# Patient Record
Sex: Female | Born: 1935 | Race: Black or African American | Hispanic: No | Smoking: Never smoker
Health system: Southern US, Community
[De-identification: ages and names within clinical notes are randomized; demographics above are authoritative.]

## PROBLEM LIST (undated history)

## (undated) DIAGNOSIS — C8300 Small cell B-cell lymphoma, unspecified site: Secondary | ICD-10-CM

## (undated) DIAGNOSIS — I5030 Unspecified diastolic (congestive) heart failure: Secondary | ICD-10-CM

## (undated) DIAGNOSIS — E785 Hyperlipidemia, unspecified: Secondary | ICD-10-CM

## (undated) DIAGNOSIS — N289 Disorder of kidney and ureter, unspecified: Secondary | ICD-10-CM

## (undated) DIAGNOSIS — I700 Atherosclerosis of aorta: Secondary | ICD-10-CM

## (undated) DIAGNOSIS — N183 Chronic kidney disease, stage 3 unspecified: Secondary | ICD-10-CM

## (undated) DIAGNOSIS — I10 Essential (primary) hypertension: Secondary | ICD-10-CM

## (undated) DIAGNOSIS — I509 Heart failure, unspecified: Secondary | ICD-10-CM

## (undated) DIAGNOSIS — I4821 Permanent atrial fibrillation: Secondary | ICD-10-CM

## (undated) DIAGNOSIS — D630 Anemia in neoplastic disease: Secondary | ICD-10-CM

## (undated) HISTORY — DX: Small cell B-cell lymphoma, unspecified site: C83.00

## (undated) HISTORY — DX: Essential (primary) hypertension: I10

## (undated) HISTORY — DX: Anemia in neoplastic disease: D63.0

## (undated) HISTORY — PX: ABDOMINAL HYSTERECTOMY: SHX81

## (undated) HISTORY — DX: Hyperlipidemia, unspecified: E78.5

## (undated) HISTORY — PX: HEMORRHOID SURGERY: SHX153

---
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
## 2005-01-21 ENCOUNTER — Ambulatory Visit: Payer: Self-pay | Admitting: Ophthalmology

## 2005-01-25 ENCOUNTER — Ambulatory Visit: Payer: Self-pay | Admitting: Ophthalmology

## 2006-06-24 ENCOUNTER — Inpatient Hospital Stay: Payer: Self-pay | Admitting: Urology

## 2006-07-03 ENCOUNTER — Other Ambulatory Visit: Payer: Self-pay

## 2006-07-03 ENCOUNTER — Ambulatory Visit: Payer: Self-pay | Admitting: Urology

## 2006-07-06 ENCOUNTER — Ambulatory Visit: Payer: Self-pay | Admitting: Urology

## 2006-07-17 ENCOUNTER — Other Ambulatory Visit: Payer: Self-pay

## 2006-07-17 ENCOUNTER — Inpatient Hospital Stay: Payer: Self-pay | Admitting: Cardiology

## 2006-07-18 ENCOUNTER — Other Ambulatory Visit: Payer: Self-pay

## 2006-07-19 ENCOUNTER — Other Ambulatory Visit: Payer: Self-pay

## 2006-07-25 ENCOUNTER — Ambulatory Visit: Payer: Self-pay | Admitting: Urology

## 2006-11-15 ENCOUNTER — Ambulatory Visit: Payer: Self-pay | Admitting: Urology

## 2007-02-15 ENCOUNTER — Emergency Department: Payer: Self-pay | Admitting: Emergency Medicine

## 2007-05-16 ENCOUNTER — Ambulatory Visit: Payer: Self-pay | Admitting: Family Medicine

## 2007-12-16 ENCOUNTER — Other Ambulatory Visit: Payer: Self-pay

## 2007-12-16 ENCOUNTER — Inpatient Hospital Stay: Payer: Self-pay | Admitting: Internal Medicine

## 2008-01-06 ENCOUNTER — Other Ambulatory Visit: Payer: Self-pay

## 2008-01-06 ENCOUNTER — Emergency Department: Payer: Self-pay | Admitting: Unknown Physician Specialty

## 2008-05-12 ENCOUNTER — Ambulatory Visit: Payer: Self-pay | Admitting: Urology

## 2008-12-18 ENCOUNTER — Ambulatory Visit: Payer: Self-pay | Admitting: Urology

## 2010-08-27 ENCOUNTER — Emergency Department: Payer: Self-pay | Admitting: Emergency Medicine

## 2011-06-07 ENCOUNTER — Ambulatory Visit: Payer: Self-pay | Admitting: Specialist

## 2011-06-15 ENCOUNTER — Ambulatory Visit: Payer: Self-pay | Admitting: Specialist

## 2012-06-20 ENCOUNTER — Emergency Department: Payer: Self-pay | Admitting: Emergency Medicine

## 2012-06-20 LAB — CBC WITH DIFFERENTIAL/PLATELET
Basophil #: 0 10*3/uL (ref 0.0–0.1)
Eosinophil #: 0 10*3/uL (ref 0.0–0.7)
HCT: 40.6 % (ref 35.0–47.0)
Lymphocyte %: 7.3 %
MCH: 25.8 pg — ABNORMAL LOW (ref 26.0–34.0)
MCV: 79 fL — ABNORMAL LOW (ref 80–100)
Monocyte %: 3.6 %
Neutrophil #: 5.6 10*3/uL (ref 1.4–6.5)
Neutrophil %: 88.4 %
Platelet: 131 10*3/uL — ABNORMAL LOW (ref 150–440)
RDW: 15.2 % — ABNORMAL HIGH (ref 11.5–14.5)
WBC: 6.3 10*3/uL (ref 3.6–11.0)

## 2012-06-20 LAB — URINALYSIS, COMPLETE
Bilirubin,UR: NEGATIVE
Ketone: NEGATIVE
Ph: 5 (ref 4.5–8.0)
Specific Gravity: 1.014 (ref 1.003–1.030)
Squamous Epithelial: 1

## 2012-06-20 LAB — COMPREHENSIVE METABOLIC PANEL
Albumin: 3.2 g/dL — ABNORMAL LOW (ref 3.4–5.0)
Alkaline Phosphatase: 73 U/L (ref 50–136)
Anion Gap: 12 (ref 7–16)
Calcium, Total: 9.2 mg/dL (ref 8.5–10.1)
Glucose: 142 mg/dL — ABNORMAL HIGH (ref 65–99)
SGOT(AST): 16 U/L (ref 15–37)

## 2012-06-20 LAB — LIPASE, BLOOD: Lipase: 102 U/L (ref 73–393)

## 2012-06-21 ENCOUNTER — Ambulatory Visit: Payer: Self-pay | Admitting: Internal Medicine

## 2012-06-21 LAB — PROTIME-INR: Prothrombin Time: 12.9 secs (ref 11.5–14.7)

## 2012-06-21 LAB — FERRITIN: Ferritin (ARMC): 76 ng/mL (ref 8–388)

## 2012-06-21 LAB — CREATININE, SERUM
Creatinine: 1.79 mg/dL — ABNORMAL HIGH (ref 0.60–1.30)
EGFR (African American): 30 — ABNORMAL LOW

## 2012-06-21 LAB — IRON AND TIBC: Iron Bind.Cap.(Total): 322 ug/dL (ref 250–450)

## 2012-06-21 LAB — APTT: Activated PTT: 30.2 secs (ref 23.6–35.9)

## 2012-06-22 LAB — CEA: CEA: 1.1 ng/mL (ref 0.0–4.7)

## 2012-06-24 ENCOUNTER — Emergency Department: Payer: Self-pay | Admitting: Internal Medicine

## 2012-06-24 LAB — COMPREHENSIVE METABOLIC PANEL
Albumin: 3.3 g/dL — ABNORMAL LOW (ref 3.4–5.0)
Alkaline Phosphatase: 71 U/L (ref 50–136)
Anion Gap: 8 (ref 7–16)
Bilirubin,Total: 0.3 mg/dL (ref 0.2–1.0)
Calcium, Total: 9.6 mg/dL (ref 8.5–10.1)
Creatinine: 1.49 mg/dL — ABNORMAL HIGH (ref 0.60–1.30)
EGFR (African American): 38 — ABNORMAL LOW
EGFR (Non-African Amer.): 33 — ABNORMAL LOW
Glucose: 111 mg/dL — ABNORMAL HIGH (ref 65–99)
Osmolality: 286 (ref 275–301)
Potassium: 3.8 mmol/L (ref 3.5–5.1)
Sodium: 141 mmol/L (ref 136–145)
Total Protein: 7.6 g/dL (ref 6.4–8.2)

## 2012-06-24 LAB — CBC
HCT: 41.5 % (ref 35.0–47.0)
HGB: 13.7 g/dL (ref 12.0–16.0)
MCH: 26.4 pg (ref 26.0–34.0)
MCHC: 33 g/dL (ref 32.0–36.0)
MCV: 80 fL (ref 80–100)
Platelet: 139 10*3/uL — ABNORMAL LOW (ref 150–440)
RBC: 5.18 10*6/uL (ref 3.80–5.20)
WBC: 4.6 10*3/uL (ref 3.6–11.0)

## 2012-06-27 LAB — CANCER CTR PLATELET CT: Platelet: 140 x10 3/mm — ABNORMAL LOW (ref 150–440)

## 2012-06-27 LAB — CREATININE, SERUM: EGFR (Non-African Amer.): 35 — ABNORMAL LOW

## 2012-06-28 ENCOUNTER — Ambulatory Visit: Payer: Self-pay | Admitting: Internal Medicine

## 2012-06-28 LAB — PROT IMMUNOELECTROPHORES(ARMC)

## 2012-07-02 DIAGNOSIS — N200 Calculus of kidney: Secondary | ICD-10-CM | POA: Insufficient documentation

## 2012-07-02 DIAGNOSIS — R8290 Unspecified abnormal findings in urine: Secondary | ICD-10-CM | POA: Insufficient documentation

## 2012-07-03 ENCOUNTER — Ambulatory Visit: Payer: Self-pay | Admitting: Internal Medicine

## 2012-07-03 DIAGNOSIS — D47Z9 Other specified neoplasms of uncertain behavior of lymphoid, hematopoietic and related tissue: Secondary | ICD-10-CM | POA: Insufficient documentation

## 2012-07-03 DIAGNOSIS — D499 Neoplasm of unspecified behavior of unspecified site: Secondary | ICD-10-CM | POA: Insufficient documentation

## 2012-07-03 DIAGNOSIS — D419 Neoplasm of uncertain behavior of unspecified urinary organ: Secondary | ICD-10-CM | POA: Insufficient documentation

## 2012-07-03 DIAGNOSIS — N1330 Unspecified hydronephrosis: Secondary | ICD-10-CM | POA: Insufficient documentation

## 2012-07-03 DIAGNOSIS — N201 Calculus of ureter: Secondary | ICD-10-CM | POA: Insufficient documentation

## 2012-07-03 DIAGNOSIS — R310 Gross hematuria: Secondary | ICD-10-CM | POA: Insufficient documentation

## 2012-09-21 ENCOUNTER — Ambulatory Visit: Payer: Self-pay | Admitting: Internal Medicine

## 2012-09-21 LAB — COMPREHENSIVE METABOLIC PANEL
Albumin: 3.9 g/dL (ref 3.4–5.0)
Alkaline Phosphatase: 89 U/L (ref 50–136)
Bilirubin,Total: 0.4 mg/dL (ref 0.2–1.0)
Calcium, Total: 9.9 mg/dL (ref 8.5–10.1)
Co2: 29 mmol/L (ref 21–32)
Creatinine: 1.22 mg/dL (ref 0.60–1.30)
EGFR (African American): 48 — ABNORMAL LOW
EGFR (Non-African Amer.): 42 — ABNORMAL LOW
Glucose: 106 mg/dL — ABNORMAL HIGH (ref 65–99)
SGOT(AST): 21 U/L (ref 15–37)
SGPT (ALT): 21 U/L (ref 12–78)

## 2012-09-21 LAB — CBC CANCER CENTER
Basophil %: 0.9 %
Eosinophil %: 1.4 %
HCT: 43.1 % (ref 35.0–47.0)
HGB: 14 g/dL (ref 12.0–16.0)
Lymphocyte #: 0.8 x10 3/mm — ABNORMAL LOW (ref 1.0–3.6)
MCH: 25.9 pg — ABNORMAL LOW (ref 26.0–34.0)
MCHC: 32.5 g/dL (ref 32.0–36.0)
Monocyte #: 0.6 x10 3/mm (ref 0.2–0.9)
Monocyte %: 11.3 %
Neutrophil #: 3.6 x10 3/mm (ref 1.4–6.5)
Neutrophil %: 71 %
Platelet: 154 x10 3/mm (ref 150–440)
RBC: 5.4 10*6/uL — ABNORMAL HIGH (ref 3.80–5.20)

## 2012-09-21 LAB — PROTIME-INR: INR: 0.9

## 2012-09-24 LAB — CEA: CEA: 1.3 ng/mL (ref 0.0–4.7)

## 2012-10-02 ENCOUNTER — Ambulatory Visit: Payer: Self-pay | Admitting: Internal Medicine

## 2012-10-03 ENCOUNTER — Ambulatory Visit: Payer: Self-pay | Admitting: Internal Medicine

## 2012-10-12 LAB — URINE IEP, RANDOM

## 2012-10-18 ENCOUNTER — Other Ambulatory Visit (HOSPITAL_COMMUNITY): Payer: Self-pay | Admitting: Internal Medicine

## 2012-10-18 ENCOUNTER — Ambulatory Visit: Payer: Self-pay | Admitting: Internal Medicine

## 2012-10-18 LAB — PROTIME-INR
INR: 1
Prothrombin Time: 13.1 secs (ref 11.5–14.7)

## 2012-11-03 ENCOUNTER — Ambulatory Visit: Payer: Self-pay | Admitting: Internal Medicine

## 2012-11-08 LAB — CBC CANCER CENTER
Basophil #: 0.1 x10 3/mm (ref 0.0–0.1)
Eosinophil #: 0.1 x10 3/mm (ref 0.0–0.7)
Eosinophil %: 2.3 %
HGB: 13.6 g/dL (ref 12.0–16.0)
Lymphocyte #: 1.1 x10 3/mm (ref 1.0–3.6)
MCV: 79 fL — ABNORMAL LOW (ref 80–100)
Monocyte #: 0.4 x10 3/mm (ref 0.2–0.9)
Monocyte %: 9.7 %
Neutrophil #: 2.6 x10 3/mm (ref 1.4–6.5)
Neutrophil %: 60.3 %
Platelet: 130 x10 3/mm — ABNORMAL LOW (ref 150–440)
RBC: 5.31 10*6/uL — ABNORMAL HIGH (ref 3.80–5.20)
WBC: 4.3 x10 3/mm (ref 3.6–11.0)

## 2012-11-08 LAB — CREATININE, SERUM
EGFR (African American): 39 — ABNORMAL LOW
EGFR (Non-African Amer.): 33 — ABNORMAL LOW

## 2012-11-22 LAB — CBC CANCER CENTER
Basophil #: 0 x10 3/mm (ref 0.0–0.1)
Eosinophil #: 0 x10 3/mm (ref 0.0–0.7)
Eosinophil %: 1.4 %
HCT: 39.3 % (ref 35.0–47.0)
MCH: 25.5 pg — ABNORMAL LOW (ref 26.0–34.0)
MCV: 78 fL — ABNORMAL LOW (ref 80–100)
RBC: 5.01 10*6/uL (ref 3.80–5.20)
RDW: 15.3 % — ABNORMAL HIGH (ref 11.5–14.5)
WBC: 3.5 x10 3/mm — ABNORMAL LOW (ref 3.6–11.0)

## 2012-11-22 LAB — CREATININE, SERUM
EGFR (African American): 39 — ABNORMAL LOW
EGFR (Non-African Amer.): 33 — ABNORMAL LOW

## 2012-11-29 LAB — CBC CANCER CENTER
Basophil #: 0 x10 3/mm (ref 0.0–0.1)
Basophil %: 0.9 %
HCT: 42.2 % (ref 35.0–47.0)
Lymphocyte #: 0.6 x10 3/mm — ABNORMAL LOW (ref 1.0–3.6)
Lymphocyte %: 14.5 %
MCH: 25.5 pg — ABNORMAL LOW (ref 26.0–34.0)
MCHC: 32.7 g/dL (ref 32.0–36.0)
MCV: 78 fL — ABNORMAL LOW (ref 80–100)
Monocyte #: 0.5 x10 3/mm (ref 0.2–0.9)
Monocyte %: 11.3 %
Neutrophil #: 3.1 x10 3/mm (ref 1.4–6.5)
Neutrophil %: 71.8 %
Platelet: 120 x10 3/mm — ABNORMAL LOW (ref 150–440)
RBC: 5.4 10*6/uL — ABNORMAL HIGH (ref 3.80–5.20)
RDW: 15.8 % — ABNORMAL HIGH (ref 11.5–14.5)
WBC: 4.4 x10 3/mm (ref 3.6–11.0)

## 2012-11-29 LAB — BASIC METABOLIC PANEL
Anion Gap: 8 (ref 7–16)
BUN: 21 mg/dL — ABNORMAL HIGH (ref 7–18)
Calcium, Total: 9 mg/dL (ref 8.5–10.1)
EGFR (African American): 43 — ABNORMAL LOW
Osmolality: 287 (ref 275–301)
Potassium: 3.7 mmol/L (ref 3.5–5.1)
Sodium: 142 mmol/L (ref 136–145)

## 2012-12-01 ENCOUNTER — Ambulatory Visit: Payer: Self-pay | Admitting: Internal Medicine

## 2012-12-06 LAB — CBC CANCER CENTER
Basophil #: 0.1 x10 3/mm (ref 0.0–0.1)
Basophil %: 1.1 %
Eosinophil #: 0.1 x10 3/mm (ref 0.0–0.7)
Eosinophil %: 1.6 %
HCT: 40.1 % (ref 35.0–47.0)
Lymphocyte %: 16.5 %
MCHC: 32.5 g/dL (ref 32.0–36.0)
MCV: 79 fL — ABNORMAL LOW (ref 80–100)
Monocyte #: 0.6 x10 3/mm (ref 0.2–0.9)
Neutrophil #: 3.9 x10 3/mm (ref 1.4–6.5)
Neutrophil %: 70.1 %
Platelet: 129 x10 3/mm — ABNORMAL LOW (ref 150–440)
RBC: 5.1 10*6/uL (ref 3.80–5.20)
RDW: 15.7 % — ABNORMAL HIGH (ref 11.5–14.5)
WBC: 5.5 x10 3/mm (ref 3.6–11.0)

## 2012-12-13 LAB — CBC CANCER CENTER
Basophil #: 0 x10 3/mm (ref 0.0–0.1)
Eosinophil #: 0.1 x10 3/mm (ref 0.0–0.7)
HCT: 39.6 % (ref 35.0–47.0)
HGB: 13 g/dL (ref 12.0–16.0)
Lymphocyte #: 0.8 x10 3/mm — ABNORMAL LOW (ref 1.0–3.6)
Monocyte #: 0.6 x10 3/mm (ref 0.2–0.9)
Monocyte %: 14.5 %
Neutrophil #: 2.8 x10 3/mm (ref 1.4–6.5)
Neutrophil %: 64 %
Platelet: 123 x10 3/mm — ABNORMAL LOW (ref 150–440)
RDW: 16.2 % — ABNORMAL HIGH (ref 11.5–14.5)
WBC: 4.3 x10 3/mm (ref 3.6–11.0)

## 2012-12-20 LAB — CBC CANCER CENTER
Eosinophil %: 2.1 %
HCT: 41.2 % (ref 35.0–47.0)
MCH: 25.7 pg — ABNORMAL LOW (ref 26.0–34.0)
MCHC: 32.6 g/dL (ref 32.0–36.0)
MCV: 79 fL — ABNORMAL LOW (ref 80–100)
Monocyte #: 0.5 x10 3/mm (ref 0.2–0.9)
Neutrophil #: 3 x10 3/mm (ref 1.4–6.5)
Platelet: 128 x10 3/mm — ABNORMAL LOW (ref 150–440)
RBC: 5.22 10*6/uL — ABNORMAL HIGH (ref 3.80–5.20)
RDW: 16.3 % — ABNORMAL HIGH (ref 11.5–14.5)
WBC: 4.3 x10 3/mm (ref 3.6–11.0)

## 2012-12-20 LAB — CREATININE, SERUM
Creatinine: 1.28 mg/dL (ref 0.60–1.30)
EGFR (African American): 45 — ABNORMAL LOW
EGFR (Non-African Amer.): 39 — ABNORMAL LOW

## 2012-12-21 LAB — PROT IMMUNOELECTROPHORES(ARMC)

## 2013-01-01 ENCOUNTER — Ambulatory Visit: Payer: Self-pay | Admitting: Internal Medicine

## 2013-01-10 LAB — CBC CANCER CENTER
Basophil #: 0 x10 3/mm (ref 0.0–0.1)
Basophil %: 0.2 %
Eosinophil #: 0.1 x10 3/mm (ref 0.0–0.7)
Eosinophil %: 1.8 %
Lymphocyte #: 0.8 x10 3/mm — ABNORMAL LOW (ref 1.0–3.6)
Lymphocyte %: 17.6 %
MCH: 25.5 pg — ABNORMAL LOW (ref 26.0–34.0)
MCHC: 31.7 g/dL — ABNORMAL LOW (ref 32.0–36.0)
MCV: 80 fL (ref 80–100)
Monocyte #: 0.6 x10 3/mm (ref 0.2–0.9)
Monocyte %: 12.6 %
Neutrophil #: 3.1 x10 3/mm (ref 1.4–6.5)
RBC: 5.33 10*6/uL — ABNORMAL HIGH (ref 3.80–5.20)
WBC: 4.5 x10 3/mm (ref 3.6–11.0)

## 2013-01-10 LAB — COMPREHENSIVE METABOLIC PANEL
Albumin: 3.4 g/dL (ref 3.4–5.0)
Alkaline Phosphatase: 71 U/L (ref 50–136)
Anion Gap: 7 (ref 7–16)
BUN: 26 mg/dL — ABNORMAL HIGH (ref 7–18)
Bilirubin,Total: 0.3 mg/dL (ref 0.2–1.0)
Calcium, Total: 9.2 mg/dL (ref 8.5–10.1)
Chloride: 102 mmol/L (ref 98–107)
Creatinine: 1.53 mg/dL — ABNORMAL HIGH (ref 0.60–1.30)
EGFR (African American): 37 — ABNORMAL LOW
EGFR (Non-African Amer.): 32 — ABNORMAL LOW
Glucose: 145 mg/dL — ABNORMAL HIGH (ref 65–99)
SGPT (ALT): 23 U/L (ref 12–78)
Sodium: 139 mmol/L (ref 136–145)
Total Protein: 7.8 g/dL (ref 6.4–8.2)

## 2013-01-11 LAB — URINE IEP, RANDOM

## 2013-01-24 ENCOUNTER — Ambulatory Visit: Payer: Self-pay | Admitting: Internal Medicine

## 2013-01-31 ENCOUNTER — Ambulatory Visit: Payer: Self-pay | Admitting: Internal Medicine

## 2013-02-14 ENCOUNTER — Ambulatory Visit: Payer: Self-pay | Admitting: Internal Medicine

## 2013-02-14 LAB — PROTIME-INR
INR: 1
Prothrombin Time: 13.6 secs (ref 11.5–14.7)

## 2013-02-14 LAB — APTT: Activated PTT: 30.9 secs (ref 23.6–35.9)

## 2013-02-14 LAB — PLATELET COUNT: Platelet: 154 10*3/uL (ref 150–440)

## 2013-03-03 ENCOUNTER — Ambulatory Visit: Payer: Self-pay | Admitting: Internal Medicine

## 2013-03-22 LAB — CBC CANCER CENTER
Eosinophil %: 1.4 %
HCT: 40.4 % (ref 35.0–47.0)
HGB: 13.6 g/dL (ref 12.0–16.0)
Lymphocyte %: 21.7 %
MCH: 26.7 pg (ref 26.0–34.0)
MCHC: 33.6 g/dL (ref 32.0–36.0)
MCV: 79 fL — ABNORMAL LOW (ref 80–100)
Monocyte #: 0.6 x10 3/mm (ref 0.2–0.9)
Monocyte %: 12.9 %
Platelet: 155 x10 3/mm (ref 150–440)
RDW: 14.7 % — ABNORMAL HIGH (ref 11.5–14.5)
WBC: 4.9 x10 3/mm (ref 3.6–11.0)

## 2013-03-22 LAB — COMPREHENSIVE METABOLIC PANEL
Anion Gap: 4 — ABNORMAL LOW (ref 7–16)
BUN: 26 mg/dL — ABNORMAL HIGH (ref 7–18)
Bilirubin,Total: 0.3 mg/dL (ref 0.2–1.0)
Calcium, Total: 9.4 mg/dL (ref 8.5–10.1)
Co2: 31 mmol/L (ref 21–32)
Creatinine: 1.41 mg/dL — ABNORMAL HIGH (ref 0.60–1.30)
EGFR (African American): 40 — ABNORMAL LOW
EGFR (Non-African Amer.): 35 — ABNORMAL LOW
Osmolality: 283 (ref 275–301)
Potassium: 3.7 mmol/L (ref 3.5–5.1)

## 2013-04-02 ENCOUNTER — Ambulatory Visit: Payer: Self-pay | Admitting: Internal Medicine

## 2013-04-11 DIAGNOSIS — C8590 Non-Hodgkin lymphoma, unspecified, unspecified site: Secondary | ICD-10-CM | POA: Insufficient documentation

## 2013-04-18 LAB — CBC CANCER CENTER
Basophil #: 0.1 x10 3/mm (ref 0.0–0.1)
Eosinophil #: 0.1 x10 3/mm (ref 0.0–0.7)
HCT: 43.8 % (ref 35.0–47.0)
HGB: 14.3 g/dL (ref 12.0–16.0)
MCHC: 32.7 g/dL (ref 32.0–36.0)
MCV: 79 fL — ABNORMAL LOW (ref 80–100)
Monocyte #: 0.4 x10 3/mm (ref 0.2–0.9)
Monocyte %: 10.8 %
Neutrophil #: 2.6 x10 3/mm (ref 1.4–6.5)
RBC: 5.58 10*6/uL — ABNORMAL HIGH (ref 3.80–5.20)
RDW: 14.6 % — ABNORMAL HIGH (ref 11.5–14.5)

## 2013-04-18 LAB — IRON AND TIBC
Iron Bind.Cap.(Total): 335 ug/dL (ref 250–450)
Unbound Iron-Bind.Cap.: 270 ug/dL

## 2013-04-18 LAB — HEPATIC FUNCTION PANEL A (ARMC)
Alkaline Phosphatase: 76 U/L (ref 50–136)
SGOT(AST): 12 U/L — ABNORMAL LOW (ref 15–37)
SGPT (ALT): 22 U/L (ref 12–78)
Total Protein: 8.4 g/dL — ABNORMAL HIGH (ref 6.4–8.2)

## 2013-04-18 LAB — CREATININE, SERUM: EGFR (Non-African Amer.): 32 — ABNORMAL LOW

## 2013-04-18 LAB — LACTATE DEHYDROGENASE: LDH: 167 U/L (ref 81–246)

## 2013-04-18 LAB — FERRITIN: Ferritin (ARMC): 65 ng/mL (ref 8–388)

## 2013-05-03 ENCOUNTER — Ambulatory Visit: Payer: Self-pay | Admitting: Internal Medicine

## 2013-06-03 ENCOUNTER — Ambulatory Visit: Payer: Self-pay | Admitting: Internal Medicine

## 2013-06-19 LAB — CBC CANCER CENTER
Basophil #: 0 x10 3/mm (ref 0.0–0.1)
Basophil %: 0.3 %
Eosinophil %: 1 %
Lymphocyte %: 22.2 %
MCH: 25.7 pg — ABNORMAL LOW (ref 26.0–34.0)
Monocyte %: 12.6 %
Neutrophil #: 3.2 x10 3/mm (ref 1.4–6.5)
Neutrophil %: 63.9 %
Platelet: 143 x10 3/mm — ABNORMAL LOW (ref 150–440)
RBC: 5.16 10*6/uL (ref 3.80–5.20)

## 2013-06-19 LAB — CREATININE, SERUM
Creatinine: 1.5 mg/dL — ABNORMAL HIGH (ref 0.60–1.30)
EGFR (African American): 37 — ABNORMAL LOW

## 2013-06-19 LAB — LACTATE DEHYDROGENASE: LDH: 154 U/L (ref 81–246)

## 2013-06-21 LAB — PROT IMMUNOELECTROPHORES(ARMC)

## 2013-06-27 ENCOUNTER — Ambulatory Visit: Payer: Self-pay | Admitting: Internal Medicine

## 2013-07-03 ENCOUNTER — Ambulatory Visit: Payer: Self-pay | Admitting: Internal Medicine

## 2013-08-21 ENCOUNTER — Ambulatory Visit: Payer: Self-pay | Admitting: Internal Medicine

## 2013-08-21 LAB — CBC CANCER CENTER
Basophil #: 0 x10 3/mm (ref 0.0–0.1)
Eosinophil %: 1.3 %
HCT: 42.5 % (ref 35.0–47.0)
Lymphocyte #: 0.8 x10 3/mm — ABNORMAL LOW (ref 1.0–3.6)
Lymphocyte %: 22.3 %
MCH: 25.6 pg — ABNORMAL LOW (ref 26.0–34.0)
MCHC: 31.9 g/dL — ABNORMAL LOW (ref 32.0–36.0)
Monocyte %: 12.1 %
Neutrophil %: 63.8 %
Platelet: 137 x10 3/mm — ABNORMAL LOW (ref 150–440)
RDW: 15.3 % — ABNORMAL HIGH (ref 11.5–14.5)

## 2013-08-21 LAB — LACTATE DEHYDROGENASE: LDH: 150 U/L (ref 81–246)

## 2013-08-21 LAB — CREATININE, SERUM
Creatinine: 1.44 mg/dL — ABNORMAL HIGH (ref 0.60–1.30)
EGFR (African American): 39 — ABNORMAL LOW
EGFR (Non-African Amer.): 34 — ABNORMAL LOW

## 2013-08-21 LAB — HEPATIC FUNCTION PANEL A (ARMC)
Albumin: 3.3 g/dL — ABNORMAL LOW (ref 3.4–5.0)
Bilirubin, Direct: 0.1 mg/dL (ref 0.00–0.20)
SGPT (ALT): 20 U/L (ref 12–78)

## 2013-09-02 ENCOUNTER — Ambulatory Visit: Payer: Self-pay | Admitting: Internal Medicine

## 2013-10-29 ENCOUNTER — Ambulatory Visit: Payer: Self-pay | Admitting: Internal Medicine

## 2013-11-13 ENCOUNTER — Ambulatory Visit: Payer: Self-pay | Admitting: Internal Medicine

## 2013-11-14 LAB — CBC CANCER CENTER
Basophil #: 0 x10 3/mm (ref 0.0–0.1)
Basophil %: 1.2 %
EOS ABS: 0.1 x10 3/mm (ref 0.0–0.7)
Eosinophil %: 2 %
HCT: 40.4 % (ref 35.0–47.0)
HGB: 12.8 g/dL (ref 12.0–16.0)
LYMPHS ABS: 0.8 x10 3/mm — AB (ref 1.0–3.6)
Lymphocyte %: 21.8 %
MCH: 25 pg — ABNORMAL LOW (ref 26.0–34.0)
MCHC: 31.6 g/dL — ABNORMAL LOW (ref 32.0–36.0)
MCV: 79 fL — ABNORMAL LOW (ref 80–100)
MONOS PCT: 13.4 %
Monocyte #: 0.5 x10 3/mm (ref 0.2–0.9)
Neutrophil #: 2.3 x10 3/mm (ref 1.4–6.5)
Neutrophil %: 61.6 %
Platelet: 144 x10 3/mm — ABNORMAL LOW (ref 150–440)
RBC: 5.11 10*6/uL (ref 3.80–5.20)
RDW: 15.5 % — ABNORMAL HIGH (ref 11.5–14.5)
WBC: 3.7 x10 3/mm (ref 3.6–11.0)

## 2013-11-14 LAB — CREATININE, SERUM
Creatinine: 1.61 mg/dL — ABNORMAL HIGH (ref 0.60–1.30)
EGFR (African American): 34 — ABNORMAL LOW
GFR CALC NON AF AMER: 29 — AB

## 2013-11-14 LAB — LACTATE DEHYDROGENASE: LDH: 168 U/L (ref 81–246)

## 2013-11-14 LAB — URIC ACID: Uric Acid: 7.3 mg/dL — ABNORMAL HIGH (ref 2.6–6.0)

## 2013-12-01 ENCOUNTER — Ambulatory Visit: Payer: Self-pay | Admitting: Internal Medicine

## 2013-12-19 ENCOUNTER — Telehealth: Payer: Self-pay | Admitting: Hematology and Oncology

## 2013-12-19 NOTE — Telephone Encounter (Signed)
S/W PATIENT AND GAVE NEW PATIENT APPT FOR 03/27 @ 8:30 W/DR. Lykens.  REFERRING DR. GITTIN DX- 2ND OPINION COLON CA WELCOME PACKET MAILED.

## 2013-12-24 ENCOUNTER — Telehealth: Payer: Self-pay | Admitting: Hematology and Oncology

## 2013-12-24 NOTE — Telephone Encounter (Signed)
C/D 12/24/13 for appt. 12/27/13

## 2013-12-27 ENCOUNTER — Encounter: Payer: Self-pay | Admitting: Hematology and Oncology

## 2013-12-27 ENCOUNTER — Ambulatory Visit: Payer: Commercial Managed Care - HMO

## 2013-12-27 ENCOUNTER — Ambulatory Visit (HOSPITAL_BASED_OUTPATIENT_CLINIC_OR_DEPARTMENT_OTHER): Payer: Commercial Managed Care - HMO | Admitting: Hematology and Oncology

## 2013-12-27 ENCOUNTER — Telehealth: Payer: Self-pay | Admitting: *Deleted

## 2013-12-27 VITALS — BP 179/64 | HR 81 | Temp 97.8°F | Resp 18 | Ht 61.0 in | Wt 207.3 lb

## 2013-12-27 DIAGNOSIS — R718 Other abnormality of red blood cells: Secondary | ICD-10-CM

## 2013-12-27 DIAGNOSIS — C880 Waldenstrom macroglobulinemia: Secondary | ICD-10-CM | POA: Insufficient documentation

## 2013-12-27 DIAGNOSIS — C8300 Small cell B-cell lymphoma, unspecified site: Secondary | ICD-10-CM

## 2013-12-27 DIAGNOSIS — R109 Unspecified abdominal pain: Secondary | ICD-10-CM

## 2013-12-27 DIAGNOSIS — C8589 Other specified types of non-Hodgkin lymphoma, extranodal and solid organ sites: Secondary | ICD-10-CM

## 2013-12-27 DIAGNOSIS — L989 Disorder of the skin and subcutaneous tissue, unspecified: Secondary | ICD-10-CM

## 2013-12-27 HISTORY — DX: Small cell B-cell lymphoma, unspecified site: C83.00

## 2013-12-27 NOTE — Telephone Encounter (Signed)
Dr. Heath Lark requests PET and CT scan done at [facility] on Disc to be sent to her at [facility]. [address],  [place] [address].   Dr. Alvy Bimler requests discs for our Radiologist review to present case at Tumor Board on 01/07/14.  S/w Rip Harbour in Radiology at [facility] and faxed over this note/request to fax [phone].

## 2013-12-27 NOTE — Progress Notes (Signed)
[facility] CONSULT NOTE  Patient Care Team: No Pcp Per Patient as PCP - General ([place]) Dallas Schimke, MD as Referring Physician (Internal Medicine)  CHIEF COMPLAINTS/PURPOSE OF CONSULTATION:  Second opinion for diagnosis of lymphoma  HISTORY OF PRESENTING ILLNESS:  Kristy Solomon 80 y.o. female is here because of her diagnosis of lymphoma. I collaborated the history with the patient. Outside information is inadequate. This patient was treated at [facility]. According to outside records, the patient initially presented to the emergency department in September of 2013 with flank pain, nausea, as well as abdominal pain. CT scan was abnormal with lymphadenopathy. The patient had history of kidney stones. Apparently, there was delay of getting a biopsy. 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 The patient was treated with single agent weekly rituximab in March of 2014. Repeat PET/CT scan showed no response to treatment. Imaging studies show multiple breast masses. On 02/14/2013, she underwent biopsy of the breast mass that came back lymphoma. The patient was being observed. Currently, she has regular, intermittent periumbilical pain several times a week. This is happening over the last 6 months. According to her, the pain is rated at 7/10 pain. She cannot describe the character of the pain. Activity such as moving around seems to improve  the pain but lying still may make it worse. It has no relationship with food intake. She has been taking Advil to control this pain. She denies any anorexia, weight loss, or change in bowel habits. She denies any new lymphadenopathy.  MEDICAL HISTORY:  Past Medical History  Diagnosis Date  . Hypertension   . Hyperlipidemia     SURGICAL HISTORY: Past Surgical History  Procedure Laterality Date  . Hemorrhoid surgery    . Abdominal  hysterectomy      SOCIAL HISTORY: History   Social History  . Marital Status: Widowed    Spouse Name: N/A    Number of Children: N/A  . Years of Education: N/A   Occupational History  . Not on file.   Social History Main Topics  . Smoking status: Never Smoker   . Smokeless tobacco: Never Used  . Alcohol Use: No  . Drug Use: No  . Sexual Activity: Not on file   Other Topics Concern  . Not on file   Social History Narrative  . No narrative on file    FAMILY HISTORY: Family History  Problem Relation Age of Onset  . Cancer Brother     throat ca  . Cancer Brother     bone cancer    ALLERGIES:  has No Known Allergies.  MEDICATIONS:  Current Outpatient Prescriptions  Medication Sig Dispense Refill  . aspirin 81 MG chewable tablet Chew 81 mg by mouth daily.      Marland Kitchen atorvastatin (LIPITOR) 20 MG tablet Take 20 mg by mouth daily.      . AZOR 5-40 MG per tablet Take 1 tablet by mouth daily.      Marland Kitchen lisinopril-hydrochlorothiazide (PRINZIDE,ZESTORETIC) 20-12.5 MG per tablet Take 1 tablet by mouth daily.      . Multiple Vitamin (MULTIVITAMIN) tablet Take 1 tablet by mouth daily.      . polyethylene glycol (MIRALAX / GLYCOLAX) packet Take 17 g by mouth daily as needed.       No current facility-administered medications for this visit.    REVIEW OF SYSTEMS:   Constitutional: Denies fevers, chills or abnormal night sweats Eyes: Denies  blurriness of vision, double vision or watery eyes Ears, nose, mouth, throat, and face: Denies mucositis or sore throat Respiratory: Denies cough, dyspnea or wheezes Cardiovascular: Denies palpitation, chest discomfort or lower extremity swelling Skin: Denies abnormal skin rashes Lymphatics: Denies new lymphadenopathy or easy bruising Neurological:Denies numbness, tingling or new weaknesses Behavioral/Psych: Mood is stable, no new changes  All other systems were reviewed with the patient and are negative.  PHYSICAL EXAMINATION: ECOG  PERFORMANCE STATUS: 0 - Asymptomatic  Filed Vitals:   12/27/13 0908  BP: 179/64  Pulse: 81  Temp: 97.8 F (36.6 C)  Resp: 18   Filed Weights   12/27/13 0908  Weight: 207 lb 4.8 oz (94.031 kg)    GENERAL:alert, no distress and comfortable. The patient is morbidly obese SKIN: Noted moist skin with a bit of pressure ulcer underneath the skin fold around her suprapubic region. No other abnormal skin rashes elsewhere EYES: normal, conjunctiva are pink and non-injected, sclera clear OROPHARYNX:no exudate, no erythema and lips, buccal mucosa, and tongue normal  NECK: supple, thyroid normal size, non-tender, without nodularity LYMPH:  no palpable lymphadenopathy in the cervical, axillary or inguinal LUNGS: clear to auscultation and percussion with normal breathing effort HEART: regular rate & rhythm and no murmurs and no lower extremity edema ABDOMEN:abdomen soft, non-tender and normal bowel sounds. Well-healed surgical scar. Musculoskeletal:no cyanosis of digits and no clubbing  PSYCH: alert & oriented x 3 with fluent speech NEURO: no focal motor/sensory deficits  LABORATORY DATA: Her most recent CBC show mild microcytosis I have reviewed the data as listed  ASSESSMENT & PLAN:  #1 lymphoplasmacytic lymphoma, status post treatment #2 intermittent chronic periumbilical pain I have requested the patient to sign consent to release information so that I could review her outside material. I will request prior biopsy slides to be revealed by her pathologist. I would also request PET/CT imaging study to be review by our radiologists. I would get her case presented at the next hematology tumor board on 01/07/2014. Due to difficulties with transportation, I will call the patient with the outcome of their review. The patient understood that she may need to come back in the future for further evaluation and possible treatment. #3 skin lesion I recommend the patient to continue local care and  avoid excessive moisture at that region to prevent infection.  All questions were answered. The patient knows to call the clinic with any problems, questions or concerns. I spent 55 minutes counseling the patient face to face. The total time spent in the appointment was 60 minutes and more than 50% was on counseling.     [place], [place], MD 12/27/2013 9:49 AM

## 2013-12-27 NOTE — Progress Notes (Signed)
Checked in new pt with no financial concerns. °

## 2013-12-30 NOTE — Telephone Encounter (Signed)
Called Pathology Dept at [facility] and left VM.  Requesting they either fax [place] the request form to have path slides sent to [place] or let us know which fax number I should fax release of information to request slides. I did speak w/ someone in Path Dept on Friday 3/27.  They were going to fax [place] over a "request form" to get the slides.  Have not received a request form.  Waiting for return call.

## 2013-12-30 NOTE — Telephone Encounter (Signed)
Received call from Jean [place] at [facility].  Ph [phone].  She instructs to fax over release of info/request for slides to fax [phone].  Faxed over release and request.

## 2014-01-01 ENCOUNTER — Telehealth: Payer: Self-pay | Admitting: *Deleted

## 2014-01-01 NOTE — Telephone Encounter (Signed)
S/w Chanel at [facility] Radiology [phone] and she reports the discs were sent out on Monday 3/30.  Called [facility] Radiology dept and they will leave message for Tedra Coupe to return call in morning to let us know if they received the discs yet.

## 2014-01-01 NOTE — Telephone Encounter (Signed)
[place] Pathology Dept at ph [phone] to f/u on request for slides.  S/w Estill Bamberg who states the slides were sent out today and our Path dept should receive them tomorrow.  [facility] Radiology Dept and s/w to f/u on request for scans on disc to be sent to [place].

## 2014-01-01 NOTE — Telephone Encounter (Signed)
Message copied by Cathlean Cower on Wed Jan 01, 2014  4:14 PM ------      Message from: [facility], [place]      Created: Wed Jan 01, 2014  1:53 PM      Regarding: outside stuff       Still no signs of outside records! ------

## 2014-01-02 ENCOUNTER — Other Ambulatory Visit (HOSPITAL_COMMUNITY): Payer: Self-pay | Admitting: Internal Medicine

## 2014-01-02 ENCOUNTER — Encounter: Payer: Self-pay | Admitting: *Deleted

## 2014-01-02 ENCOUNTER — Telehealth: Payer: Self-pay | Admitting: *Deleted

## 2014-01-02 NOTE — Telephone Encounter (Signed)
S/w Tedra Coupe at [facility].  She has not received PET/CT from [place] yet. She did call them and they confirm the discs were sent on Monday by standard mail.  We should be receiving them soon.  She will let us know when they arrive.

## 2014-01-02 NOTE — Telephone Encounter (Signed)
Spoke w/ Vada at [facility] Pathology and they have received pt's slides from [place].  Dr. Tammi Klippel is reviewing them now.

## 2014-01-02 NOTE — Progress Notes (Signed)
PET/CT from [facility] came in mail this afternoon to Dr. Alvy Bimler.  Took discs to [facility] Radiology dept for them to enter into our system and for our Radiologists to review.

## 2014-01-03 ENCOUNTER — Other Ambulatory Visit: Payer: Self-pay | Admitting: Hematology and Oncology

## 2014-01-03 ENCOUNTER — Inpatient Hospital Stay
Admission: RE | Admit: 2014-01-03 | Discharge: 2014-01-03 | Disposition: A | Discharge status: Home / Self Care | Payer: Self-pay | Source: Ambulatory Visit | Attending: Hematology and Oncology | Admitting: Hematology and Oncology

## 2014-01-03 DIAGNOSIS — C8300 Small cell B-cell lymphoma, unspecified site: Secondary | ICD-10-CM

## 2014-01-10 ENCOUNTER — Telehealth: Payer: Self-pay | Admitting: Hematology and Oncology

## 2014-01-10 NOTE — Telephone Encounter (Signed)
I spoke with the patient over the telephone, informing her about our discussion from recent hematology tumor board. Overall, she has low-grade non-Hodgkin lymphoma. The radiologist did not think there is substantial disease in the small bowel mesentery that could cause periumbilical pain. Right now, I do not feel strongly she needs to be started on treatment. I recommend return appointment in 4 months with history, physical examination and blood work. The patient will arrange with family members to get transportation here and she will call me about the date and time that what best for her. I addressed all questions and concerns.

## 2014-04-12 ENCOUNTER — Emergency Department: Payer: Self-pay | Admitting: Emergency Medicine

## 2014-04-12 LAB — CBC WITH DIFFERENTIAL/PLATELET
BASOS ABS: 0 10*3/uL (ref 0.0–0.1)
Basophil %: 0.9 %
EOS PCT: 0.3 %
Eosinophil #: 0 10*3/uL (ref 0.0–0.7)
HCT: 41.6 % (ref 35.0–47.0)
HGB: 13.5 g/dL (ref 12.0–16.0)
LYMPHS PCT: 11.1 %
Lymphocyte #: 0.5 10*3/uL — ABNORMAL LOW (ref 1.0–3.6)
MCH: 25.5 pg — ABNORMAL LOW (ref 26.0–34.0)
MCHC: 32.3 g/dL (ref 32.0–36.0)
MCV: 79 fL — ABNORMAL LOW (ref 80–100)
MONO ABS: 0.5 x10 3/mm (ref 0.2–0.9)
Monocyte %: 10.3 %
NEUTROS ABS: 3.5 10*3/uL (ref 1.4–6.5)
Neutrophil %: 77.4 %
Platelet: 132 10*3/uL — ABNORMAL LOW (ref 150–440)
RBC: 5.27 10*6/uL — AB (ref 3.80–5.20)
RDW: 15.8 % — ABNORMAL HIGH (ref 11.5–14.5)
WBC: 4.5 10*3/uL (ref 3.6–11.0)

## 2014-04-12 LAB — COMPREHENSIVE METABOLIC PANEL
ALT: 13 U/L (ref 12–78)
ANION GAP: 10 (ref 7–16)
Albumin: 2.7 g/dL — ABNORMAL LOW (ref 3.4–5.0)
Alkaline Phosphatase: 52 U/L
BILIRUBIN TOTAL: 0.4 mg/dL (ref 0.2–1.0)
BUN: 28 mg/dL — ABNORMAL HIGH (ref 7–18)
Calcium, Total: 9.1 mg/dL (ref 8.5–10.1)
Chloride: 104 mmol/L (ref 98–107)
Co2: 24 mmol/L (ref 21–32)
Creatinine: 1.73 mg/dL — ABNORMAL HIGH (ref 0.60–1.30)
EGFR (Non-African Amer.): 27 — ABNORMAL LOW
GFR CALC AF AMER: 31 — AB
GLUCOSE: 133 mg/dL — AB (ref 65–99)
OSMOLALITY: 283 (ref 275–301)
Potassium: 3.4 mmol/L — ABNORMAL LOW (ref 3.5–5.1)
SGOT(AST): 15 U/L (ref 15–37)
Sodium: 138 mmol/L (ref 136–145)
Total Protein: 8.7 g/dL — ABNORMAL HIGH (ref 6.4–8.2)

## 2014-04-12 LAB — URINALYSIS, COMPLETE
BILIRUBIN, UR: NEGATIVE
GLUCOSE, UR: NEGATIVE mg/dL (ref 0–75)
Ketone: NEGATIVE
Nitrite: NEGATIVE
Ph: 5 (ref 4.5–8.0)
Protein: 30
Specific Gravity: 1.019 (ref 1.003–1.030)
Squamous Epithelial: 2
WBC UR: 42 /HPF (ref 0–5)

## 2014-04-12 LAB — LIPASE, BLOOD: Lipase: 107 U/L (ref 73–393)

## 2014-04-12 LAB — TROPONIN I: Troponin-I: <0.02 ng/mL

## 2014-04-13 LAB — URINE CULTURE

## 2014-06-11 ENCOUNTER — Ambulatory Visit: Payer: Self-pay | Admitting: General Surgery

## 2014-06-24 ENCOUNTER — Telehealth: Payer: Self-pay

## 2014-06-24 NOTE — Telephone Encounter (Signed)
Received fax from [facility] that patient is ready to be seen.  Spoke with patient who stated she is ready to be seen in the next few weeks but needs to work on transportation with one of her grandsons. Patient will call Dr Calton Dach nurse back with dates she has transportation.  Office number given to patient and explained to ask for Dr Calton Dach nurse.  Patient voiced understanding and will call back with any other questions or concerns.

## 2014-06-26 ENCOUNTER — Telehealth: Payer: Self-pay | Admitting: *Deleted

## 2014-06-26 ENCOUNTER — Telehealth: Payer: Self-pay | Admitting: Hematology and Oncology

## 2014-06-26 NOTE — Telephone Encounter (Signed)
Pt requests to see Dr. Alvy Bimler tomorrow.  Informed her Dr. Alvy Bimler not in office tomorrow.  Pt says she can come next Friday 10/02.  Instructed pt to come at 10:45 am for Lab and will see Dr. Alvy Bimler at 11:15 am.  Pt says her grandson can bring her on this day as long as he can get back to [facility] in time to work at 3 pm.  Assured pt they will get back to [facility] by 3 pm.   Sent Request to Scheduler to add pt on for 10/02.

## 2014-06-26 NOTE — Telephone Encounter (Signed)
s.w. pt and advised on OCT appt.Marland KitchenMarland KitchenMarland KitchenMarland Kitchenpt was already aware

## 2014-06-27 ENCOUNTER — Other Ambulatory Visit: Payer: Self-pay | Admitting: Hematology and Oncology

## 2014-06-27 ENCOUNTER — Encounter: Payer: Self-pay | Admitting: Hematology and Oncology

## 2014-06-27 DIAGNOSIS — D630 Anemia in neoplastic disease: Secondary | ICD-10-CM | POA: Insufficient documentation

## 2014-06-27 DIAGNOSIS — C8300 Small cell B-cell lymphoma, unspecified site: Secondary | ICD-10-CM

## 2014-06-27 HISTORY — DX: Anemia in neoplastic disease: D63.0

## 2014-06-27 NOTE — Telephone Encounter (Signed)
Done, thanks

## 2014-07-04 ENCOUNTER — Telehealth: Payer: Self-pay | Admitting: *Deleted

## 2014-07-04 ENCOUNTER — Telehealth: Payer: Self-pay | Admitting: Hematology and Oncology

## 2014-07-04 ENCOUNTER — Ambulatory Visit (HOSPITAL_BASED_OUTPATIENT_CLINIC_OR_DEPARTMENT_OTHER): Payer: Commercial Managed Care - HMO | Admitting: Hematology and Oncology

## 2014-07-04 ENCOUNTER — Encounter: Payer: Self-pay | Admitting: Hematology and Oncology

## 2014-07-04 ENCOUNTER — Other Ambulatory Visit (HOSPITAL_BASED_OUTPATIENT_CLINIC_OR_DEPARTMENT_OTHER): Payer: Commercial Managed Care - HMO

## 2014-07-04 VITALS — BP 164/63 | HR 65 | Temp 98.4°F | Resp 20 | Ht 61.0 in | Wt 204.7 lb

## 2014-07-04 DIAGNOSIS — E790 Hyperuricemia without signs of inflammatory arthritis and tophaceous disease: Secondary | ICD-10-CM | POA: Insufficient documentation

## 2014-07-04 DIAGNOSIS — C8300 Small cell B-cell lymphoma, unspecified site: Secondary | ICD-10-CM

## 2014-07-04 DIAGNOSIS — N178 Other acute kidney failure: Secondary | ICD-10-CM

## 2014-07-04 DIAGNOSIS — N189 Chronic kidney disease, unspecified: Secondary | ICD-10-CM | POA: Insufficient documentation

## 2014-07-04 DIAGNOSIS — D630 Anemia in neoplastic disease: Secondary | ICD-10-CM

## 2014-07-04 DIAGNOSIS — N179 Acute kidney failure, unspecified: Secondary | ICD-10-CM | POA: Insufficient documentation

## 2014-07-04 DIAGNOSIS — D696 Thrombocytopenia, unspecified: Secondary | ICD-10-CM

## 2014-07-04 DIAGNOSIS — Z23 Encounter for immunization: Secondary | ICD-10-CM

## 2014-07-04 LAB — CBC WITH DIFFERENTIAL/PLATELET
BASO%: 1 % (ref 0.0–2.0)
Basophils Absolute: 0 10e3/uL (ref 0.0–0.1)
EOS%: 1.2 % (ref 0.0–7.0)
Eosinophils Absolute: 0.1 10e3/uL (ref 0.0–0.5)
HCT: 41.8 % (ref 34.8–46.6)
HGB: 13.3 g/dL (ref 11.6–15.9)
LYMPH%: 18.6 % (ref 14.0–49.7)
MCH: 24.8 pg — ABNORMAL LOW (ref 25.1–34.0)
MCHC: 31.8 g/dL (ref 31.5–36.0)
MCV: 78.2 fL — ABNORMAL LOW (ref 79.5–101.0)
MONO#: 0.4 10e3/uL (ref 0.1–0.9)
MONO%: 9.8 % (ref 0.0–14.0)
NEUT#: 2.8 10e3/uL (ref 1.5–6.5)
NEUT%: 69.4 % (ref 38.4–76.8)
Platelets: 128 10e3/uL — ABNORMAL LOW (ref 145–400)
RBC: 5.34 10e6/uL (ref 3.70–5.45)
RDW: 15.6 % — ABNORMAL HIGH (ref 11.2–14.5)
WBC: 4.1 10e3/uL (ref 3.9–10.3)
lymph#: 0.8 10e3/uL — ABNORMAL LOW (ref 0.9–3.3)

## 2014-07-04 LAB — IRON AND TIBC CHCC
%SAT: 18 % — ABNORMAL LOW (ref 21–57)
Iron: 49 ug/dL (ref 41–142)
TIBC: 265 ug/dL (ref 236–444)
UIBC: 217 ug/dL (ref 120–384)

## 2014-07-04 LAB — COMPREHENSIVE METABOLIC PANEL (CC13)
ALT: 10 U/L (ref 0–55)
AST: 10 U/L (ref 5–34)
Albumin: 3 g/dL — ABNORMAL LOW (ref 3.5–5.0)
Alkaline Phosphatase: 55 U/L (ref 40–150)
Anion Gap: 10 meq/L (ref 3–11)
BUN: 30.9 mg/dL — ABNORMAL HIGH (ref 7.0–26.0)
CO2: 25 meq/L (ref 22–29)
Calcium: 10.2 mg/dL (ref 8.4–10.4)
Chloride: 106 meq/L (ref 98–109)
Creatinine: 1.8 mg/dL — ABNORMAL HIGH (ref 0.6–1.1)
Glucose: 97 mg/dL (ref 70–140)
Potassium: 3.8 meq/L (ref 3.5–5.1)
Sodium: 141 meq/L (ref 136–145)
Total Bilirubin: 0.36 mg/dL (ref 0.20–1.20)
Total Protein: 9.7 g/dL — ABNORMAL HIGH (ref 6.4–8.3)

## 2014-07-04 LAB — LACTATE DEHYDROGENASE (CC13): LDH: 161 U/L (ref 125–245)

## 2014-07-04 LAB — FERRITIN CHCC: Ferritin: 27 ng/ml (ref 9–269)

## 2014-07-04 LAB — HOLD TUBE, BLOOD BANK

## 2014-07-04 LAB — URIC ACID (CC13): Uric Acid, Serum: 10.9 mg/dL — ABNORMAL HIGH (ref 2.6–7.4)

## 2014-07-04 MED ORDER — INFLUENZA VAC SPLIT QUAD 0.5 ML IM SUSY
0.5000 mL | PREFILLED_SYRINGE | Freq: Once | INTRAMUSCULAR | Status: AC
  Administered 2014-07-04: 0.5 mL via INTRAMUSCULAR
  Filled 2014-07-04: qty 0.5

## 2014-07-04 NOTE — Telephone Encounter (Signed)
Message copied by Cathlean Cower on Fri Jul 04, 2014  1:00 PM ------      Message from: [facility], [place]: Fri Jul 04, 2014 12:58 PM      Regarding: elevated creatinine       Please see if you can call her PCP for recheck. Let the patient know she may need to cut back on her BP medication.      ----- Message -----         From: Lab in [facility]         Sent: 07/04/2014  11:06 AM           To: Heath Lark, MD                   ------

## 2014-07-04 NOTE — Telephone Encounter (Signed)
Called pt for name of her PCP.  She said she would have to find it and call nurse back.

## 2014-07-04 NOTE — Telephone Encounter (Signed)
gv and printed appt sched and avs for pt for April 2016 °

## 2014-07-04 NOTE — Progress Notes (Signed)
[facility] OFFICE PROGRESS NOTE  Patient Care Team: No Pcp Per Patient as PCP - General ([place]) Dallas Schimke, MD as Referring Physician (Internal Medicine)  SUMMARY OF ONCOLOGIC HISTORY: This patient was treated at [facility]. According to outside records, the patient initially presented to the emergency department in September of 2013 with flank pain, nausea, as well as abdominal pain. CT scan was abnormal with lymphadenopathy. The patient had history of kidney stones. Apparently, there was delay of getting a biopsy. 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 The patient was treated with single agent weekly rituximab in March of 2014. Repeat PET/CT scan showed no response to treatment. Imaging studies show multiple breast masses. On 02/14/2013, she underwent biopsy of the breast mass that came back lymphoma. The patient was being observed.  INTERVAL HISTORY: Please see below for problem oriented charting. Since I last saw her, her periumbilical pain has resolved. She denies recent infection. Denies new lymphadenopathy. The patient denies any recent signs or symptoms of bleeding such as spontaneous epistaxis, hematuria or hematochezia.   REVIEW OF SYSTEMS:   Constitutional: Denies fevers, chills or abnormal weight loss Eyes: Denies blurriness of vision Ears, nose, mouth, throat, and face: Denies mucositis or sore throat Respiratory: Denies cough, dyspnea or wheezes Cardiovascular: Denies palpitation, chest discomfort or lower extremity swelling Gastrointestinal:  Denies nausea, heartburn or change in bowel habits Skin: Denies abnormal skin rashes Lymphatics: Denies new lymphadenopathy or easy bruising Neurological:Denies numbness, tingling or new weaknesses Behavioral/Psych: Mood is stable, no new changes  All other systems were reviewed with the patient and are  negative.  I have reviewed the past medical history, past surgical history, social history and family history with the patient and they are unchanged from previous note.  ALLERGIES:  has No Known Allergies.  MEDICATIONS:  Current Outpatient Prescriptions  Medication Sig Dispense Refill  . aspirin 81 MG chewable tablet Chew 81 mg by mouth daily.      Marland Kitchen atorvastatin (LIPITOR) 20 MG tablet Take 20 mg by mouth daily.      . AZOR 5-40 MG per tablet Take 1 tablet by mouth daily.      Marland Kitchen lisinopril-hydrochlorothiazide (PRINZIDE,ZESTORETIC) 20-12.5 MG per tablet Take 1 tablet by mouth daily.      . Multiple Vitamin (MULTIVITAMIN) tablet Take 1 tablet by mouth daily.      . polyethylene glycol (MIRALAX / GLYCOLAX) packet Take 17 g by mouth daily as needed.       No current facility-administered medications for this visit.    PHYSICAL EXAMINATION: ECOG PERFORMANCE STATUS: 0 - Asymptomatic  Filed Vitals:   07/04/14 1100  BP: 164/63  Pulse: 65  Temp: 98.4 F (36.9 C)  Resp: 20   Filed Weights   07/04/14 1100  Weight: 204 lb 11.2 oz (92.851 kg)    GENERAL:alert, no distress and comfortable. She is morbidly obese SKIN: skin color, texture, turgor are normal, no rashes or significant lesions EYES: normal, Conjunctiva are pink and non-injected, sclera clear OROPHARYNX:no exudate, no erythema and lips, buccal mucosa, and tongue normal  NECK: supple, thyroid normal size, non-tender, without nodularity LYMPH:  no palpable lymphadenopathy in the cervical, axillary or inguinal LUNGS: clear to auscultation and percussion with normal breathing effort HEART: regular rate & rhythm and no murmurs and no lower extremity edema ABDOMEN:abdomen soft, non-tender and normal bowel sounds Musculoskeletal:no cyanosis of digits and no clubbing  NEURO: alert & oriented  x 3 with fluent speech, no focal motor/sensory deficits  LABORATORY DATA:  I have reviewed the data as listed    Component Value  Date/Time   NA 141 07/04/2014 1030   K 3.8 07/04/2014 1030   CO2 25 07/04/2014 1030   GLUCOSE 97 07/04/2014 1030   BUN 30.9* 07/04/2014 1030   CREATININE 1.8* 07/04/2014 1030   CALCIUM 10.2 07/04/2014 1030   PROT 9.7* 07/04/2014 1030   ALBUMIN 3.0* 07/04/2014 1030   AST 10 07/04/2014 1030   ALT 10 07/04/2014 1030   ALKPHOS 55 07/04/2014 1030   BILITOT 0.36 07/04/2014 1030    No results found for this basename: SPEP,  UPEP,   kappa and lambda light chains    Lab Results  Component Value Date   WBC 4.1 07/04/2014   NEUTROABS 2.8 07/04/2014   HGB 13.3 07/04/2014   HCT 41.8 07/04/2014   MCV 78.2* 07/04/2014   PLT 128* 07/04/2014      Chemistry      Component Value Date/Time   NA 141 07/04/2014 1030   K 3.8 07/04/2014 1030   CO2 25 07/04/2014 1030   BUN 30.9* 07/04/2014 1030   CREATININE 1.8* 07/04/2014 1030      Component Value Date/Time   CALCIUM 10.2 07/04/2014 1030   ALKPHOS 55 07/04/2014 1030   AST 10 07/04/2014 1030   ALT 10 07/04/2014 1030   BILITOT 0.36 07/04/2014 1030     ASSESSMENT & PLAN:  Malignant lymphoma, lymphoplasmacytic Clinically, with elevated protein level, along with high level of uric acid, I suspect she still has low grade and persistent disease. However, she had no signs of B. symptoms. I reinforced the importance of close followup. I plan to see her back in 6 months with repeat history, physical examination and blood work. I educated the patient of signs and symptoms to watch out for disease progression.  Thrombocytopenia The cause is unknown, could be related to her underlying lymphoproliferative disorder. It is mild and there is little change compared from previous platelet count. The patient denies recent history of bleeding such as epistaxis, hematuria or hematochezia. She is asymptomatic from the thrombocytopenia. I will observe for now.  she does not require transfusion now.     Acute renal failure She had signs of acute renal failure. She is currently on  high-dose ARB. I will call her with results and recommend her to stop treatment over the weekend, drink additional oral fluid intake and recheck PCP next week..   Hyperuricemia This is related to elevated creatinine. I will call her PCP office for repeat blood work early next week if possible.     Orders Placed This Encounter  Procedures  . CBC with Differential    Standing Status: Future     Number of Occurrences:      Standing Expiration Date: 08/08/2015  . Comprehensive metabolic panel    Standing Status: Future     Number of Occurrences:      Standing Expiration Date: 08/08/2015  . Lactate dehydrogenase    Standing Status: Future     Number of Occurrences:      Standing Expiration Date: 08/08/2015  . SPEP & IFE with QIG    Standing Status: Future     Number of Occurrences:      Standing Expiration Date: 08/08/2015  . Kappa/lambda light chains    Standing Status: Future     Number of Occurrences:      Standing Expiration Date: 08/08/2015  .  Beta 2 microglobulin, serum    Standing Status: Future     Number of Occurrences:      Standing Expiration Date: 08/08/2015  . Sedimentation rate    Standing Status: Future     Number of Occurrences:      Standing Expiration Date: 08/08/2015  . Uric Acid    Standing Status: Future     Number of Occurrences:      Standing Expiration Date: 08/08/2015  . Iron and TIBC    Standing Status: Future     Number of Occurrences:      Standing Expiration Date: 08/08/2015  . Ferritin    Standing Status: Future     Number of Occurrences:      Standing Expiration Date: 08/08/2015   All questions were answered. The patient knows to call the clinic with any problems, questions or concerns. No barriers to learning was detected. I spent 30 minutes counseling the patient face to face. The total time spent in the appointment was 40 minutes and more than 50% was on counseling and review of test results     [facility], [place], MD 07/04/2014 12:53 PM

## 2014-07-04 NOTE — Telephone Encounter (Signed)
This RN had also informed pt of elevated creatinine which she says she was aware by Dr. Alvy Bimler. Informed her we need to report to her PCP for possible dose reduction of BP med and for them to recheck her lab.   Pt verbalized understanding and said she would call back w/ name of her PCP.  Waiting for call back.

## 2014-07-04 NOTE — Assessment & Plan Note (Signed)
She had signs of acute renal failure. She is currently on high-dose ARB. I will call her with results and recommend her to stop treatment over the weekend, drink additional oral fluid intake and recheck PCP next week.Marland Kitchen

## 2014-07-04 NOTE — Assessment & Plan Note (Signed)
The cause is unknown, could be related to her underlying lymphoproliferative disorder. It is mild and there is little change compared from previous platelet count. The patient denies recent history of bleeding such as epistaxis, hematuria or hematochezia. She is asymptomatic from the thrombocytopenia. I will observe for now.  she does not require transfusion now.

## 2014-07-04 NOTE — Assessment & Plan Note (Signed)
This is related to elevated creatinine. I will call her PCP office for repeat blood work early next week if possible.

## 2014-07-04 NOTE — Assessment & Plan Note (Signed)
Clinically, with elevated protein level, along with high level of uric acid, I suspect she still has low grade and persistent disease. However, she had no signs of B. symptoms. I reinforced the importance of close followup. I plan to see her back in 6 months with repeat history, physical examination and blood work. I educated the patient of signs and symptoms to watch out for disease progression.

## 2014-07-09 LAB — BETA 2 MICROGLOBULIN, SERUM: BETA 2 MICROGLOBULIN: 6.74 mg/L — AB (ref <=2.51)

## 2014-07-09 LAB — SPEP & IFE WITH QIG
ALPHA-1-GLOBULIN: 5.7 % — AB (ref 2.9–4.9)
Albumin ELP: 35 % — ABNORMAL LOW (ref 55.8–66.1)
Alpha-2-Globulin: 7.7 % (ref 7.1–11.8)
Beta 2: 5.2 % (ref 3.2–6.5)
Beta Globulin: 5.9 % (ref 4.7–7.2)
Gamma Globulin: 40.5 % — ABNORMAL HIGH (ref 11.1–18.8)
IgA: 260 mg/dL (ref 69–380)
IgG (Immunoglobin G), Serum: 1050 mg/dL (ref 690–1700)
IgM, Serum: 4750 mg/dL — ABNORMAL HIGH (ref 52–322)
M-Spike, %: 2.7 g/dL
Total Protein, Serum Electrophoresis: 9.2 g/dL — ABNORMAL HIGH (ref 6.0–8.3)

## 2014-07-09 LAB — KAPPA/LAMBDA LIGHT CHAINS
KAPPA FREE LGHT CHN: 75.6 mg/dL — AB (ref 0.33–1.94)
KAPPA LAMBDA RATIO: 33.6 — AB (ref 0.26–1.65)
Lambda Free Lght Chn: 2.25 mg/dL (ref 0.57–2.63)

## 2014-07-30 ENCOUNTER — Encounter: Payer: Self-pay | Admitting: *Deleted

## 2014-10-08 ENCOUNTER — Emergency Department: Payer: Self-pay | Admitting: Emergency Medicine

## 2014-12-13 ENCOUNTER — Emergency Department: Payer: Self-pay | Admitting: Student

## 2014-12-17 ENCOUNTER — Telehealth: Payer: Self-pay | Admitting: Hematology and Oncology

## 2014-12-17 NOTE — Telephone Encounter (Signed)
returned call and spoke with pt and confirmed appt....patient ok and aware

## 2015-01-02 ENCOUNTER — Ambulatory Visit: Payer: Commercial Managed Care - HMO | Admitting: Hematology and Oncology

## 2015-01-02 ENCOUNTER — Other Ambulatory Visit: Payer: Commercial Managed Care - HMO

## 2015-09-10 ENCOUNTER — Encounter: Payer: Self-pay | Admitting: *Deleted

## 2015-09-10 ENCOUNTER — Emergency Department
Admission: EM | Admit: 2015-09-10 | Discharge: 2015-09-10 | Disposition: A | Discharge status: Home / Self Care | Payer: Commercial Managed Care - HMO | Attending: Emergency Medicine | Admitting: Emergency Medicine

## 2015-09-10 DIAGNOSIS — J069 Acute upper respiratory infection, unspecified: Secondary | ICD-10-CM | POA: Insufficient documentation

## 2015-09-10 DIAGNOSIS — B9689 Other specified bacterial agents as the cause of diseases classified elsewhere: Secondary | ICD-10-CM

## 2015-09-10 DIAGNOSIS — I10 Essential (primary) hypertension: Secondary | ICD-10-CM | POA: Diagnosis not present

## 2015-09-10 DIAGNOSIS — J029 Acute pharyngitis, unspecified: Secondary | ICD-10-CM | POA: Diagnosis present

## 2015-09-10 DIAGNOSIS — Z79899 Other long term (current) drug therapy: Secondary | ICD-10-CM | POA: Insufficient documentation

## 2015-09-10 DIAGNOSIS — Z7982 Long term (current) use of aspirin: Secondary | ICD-10-CM | POA: Diagnosis not present

## 2015-09-10 MED ORDER — AZITHROMYCIN 250 MG PO TABS: ORAL_TABLET | ORAL | Status: DC

## 2015-09-10 MED ORDER — PSEUDOEPH-BROMPHEN-DM 30-2-10 MG/5ML PO SYRP: 10.0000 mL | ORAL_SOLUTION | Freq: Four times a day (QID) | ORAL | Status: DC | PRN

## 2015-09-10 MED ORDER — MAGIC MOUTHWASH W/LIDOCAINE: 5.0000 mL | Freq: Four times a day (QID) | ORAL | Status: DC

## 2015-09-10 NOTE — Discharge Instructions (Signed)
Upper Respiratory Infection, Adult Most upper respiratory infections (URIs) are a viral infection of the air passages leading to the lungs. A URI affects the nose, throat, and upper air passages. The most common type of URI is nasopharyngitis and is typically referred to as "the common cold." URIs run their course and usually go away on their own. Most of the time, a URI does not require medical attention, but sometimes a bacterial infection in the upper airways can follow a viral infection. This is called a secondary infection. Sinus and middle ear infections are common types of secondary upper respiratory infections. Bacterial pneumonia can also complicate a URI. A URI can worsen asthma and chronic obstructive pulmonary disease (COPD). Sometimes, these complications can require emergency medical care and may be life threatening.  CAUSES Almost all URIs are caused by viruses. A virus is a type of germ and can spread from one person to another.  RISKS FACTORS You may be at risk for a URI if:   You smoke.   You have chronic heart or lung disease.  You have a weakened defense (immune) system.   You are very young or very old.   You have nasal allergies or asthma.  You work in crowded or poorly ventilated areas.  You work in health care facilities or schools. SIGNS AND SYMPTOMS  Symptoms typically develop 2-3 days after you come in contact with a cold virus. Most viral URIs last 7-10 days. However, viral URIs from the influenza virus (flu virus) can last 14-18 days and are typically more severe. Symptoms may include:   Runny or stuffy (congested) nose.   Sneezing.   Cough.   Sore throat.   Headache.   Fatigue.   Fever.   Loss of appetite.   Pain in your forehead, behind your eyes, and over your cheekbones (sinus pain).  Muscle aches.  DIAGNOSIS  Your health care provider may diagnose a URI by:  Physical exam.  Tests to check that your symptoms are not due to  another condition such as:  Strep throat.  Sinusitis.  Pneumonia.  Asthma. TREATMENT  A URI goes away on its own with time. It cannot be cured with medicines, but medicines may be prescribed or recommended to relieve symptoms. Medicines may help:  Reduce your fever.  Reduce your cough.  Relieve nasal congestion. HOME CARE INSTRUCTIONS   Take medicines only as directed by your health care provider.   Gargle warm saltwater or take cough drops to comfort your throat as directed by your health care provider.  Use a warm mist humidifier or inhale steam from a shower to increase air moisture. This may make it easier to breathe.  Drink enough fluid to keep your urine clear or pale yellow.   Eat soups and other clear broths and maintain good nutrition.   Rest as needed.   Return to work when your temperature has returned to normal or as your health care provider advises. You may need to stay home longer to avoid infecting others. You can also use a face mask and careful hand washing to prevent spread of the virus.  Increase the usage of your inhaler if you have asthma.   Do not use any tobacco products, including cigarettes, chewing tobacco, or electronic cigarettes. If you need help quitting, ask your health care provider. PREVENTION  The best way to protect yourself from getting a cold is to practice good hygiene.   Avoid oral or hand contact with people with cold   symptoms.   Wash your hands often if contact occurs.  There is no clear evidence that vitamin C, vitamin E, echinacea, or exercise reduces the chance of developing a cold. However, it is always recommended to get plenty of rest, exercise, and practice good nutrition.  SEEK MEDICAL CARE IF:   You are getting worse rather than better.   Your symptoms are not controlled by medicine.   You have chills.  You have worsening shortness of breath.  You have brown or red mucus.  You have yellow or brown nasal  discharge.  You have pain in your face, especially when you bend forward.  You have a fever.  You have swollen neck glands.  You have pain while swallowing.  You have white areas in the back of your throat. SEEK IMMEDIATE MEDICAL CARE IF:   You have severe or persistent:  Headache.  Ear pain.  Sinus pain.  Chest pain.  You have chronic lung disease and any of the following:  Wheezing.  Prolonged cough.  Coughing up blood.  A change in your usual mucus.  You have a stiff neck.  You have changes in your:  Vision.  Hearing.  Thinking.  Mood. MAKE SURE YOU:   Understand these instructions.  Will watch your condition.  Will get help right away if you are not doing well or get worse.   This information is not intended to replace advice given to you by your health care provider. Make sure you discuss any questions you have with your health care provider.   Document Released: 03/15/2001 Document Revised: 02/03/2015 Document Reviewed: 12/25/2013 Elsevier Interactive Patient Education 2016 [employer]  

## 2015-09-10 NOTE — ED Notes (Signed)
pt states sore throat, body aches, cough and runny nose for 3 days

## 2015-09-10 NOTE — ED Provider Notes (Signed)
[facility] Emergency Department Provider Note  ____________________________________________  Time seen: Approximately 10:01 AM  I have reviewed the triage vital signs and the nursing notes.   HISTORY  Chief Complaint Sore Throat and Generalized Body Aches    HPI Kristy Solomon is a 81 y.o. female who presents to the emergency department complaining of mild nasal congestion, sore throat, and coughing. She states that the symptoms are going on for 7-10 days. She states that she just felt "drained". She denies fever, headache, visual acuity changes, neck pain, chest pain, shortness of breath, abdominal pain, nausea or vomiting. She is taken an over-the-counter cough syrup which she said has not "fix me."   Past Medical History  Diagnosis Date  . Hypertension   . Hyperlipidemia   . 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 Thrombocytopenia ([place]) 07/04/2014  . Acute renal failure ([place]) 07/04/2014  . Hyperuricemia 07/04/2014  . 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 Hemorrhoid surgery    . Abdominal hysterectomy      Current Outpatient Rx  Name  Route  Sig  Dispense  Refill  . aspirin 81 MG chewable tablet   Oral   Chew 81 mg by mouth daily.         Marland Kitchen atorvastatin (LIPITOR) 20 MG tablet   Oral   Take 20 mg by mouth daily.         Marland Kitchen azithromycin (ZITHROMAX Z-PAK) 250 MG tablet      Take 2 tablets (500 mg) on  Day 1,  followed by 1 tablet (250 mg) once daily on Days 2 through 5.   6 each   0   . AZOR 5-40 MG per tablet   Oral   Take 1 tablet by mouth daily.         . brompheniramine-pseudoephedrine-DM 30-2-10 MG/5ML syrup   Oral   Take 10 mLs by mouth 4 (four) times daily as needed.   200 mL   0   .  lisinopril-hydrochlorothiazide (PRINZIDE,ZESTORETIC) 20-12.5 MG per tablet   Oral   Take 1 tablet by mouth daily.         . magic mouthwash w/lidocaine SOLN   Oral   Take 5 mLs by mouth 4 (four) times daily.   240 mL   0     Dispense in a 1/1/1/1 ratio. Use lidocaine, diphen ...   . Multiple Vitamin (MULTIVITAMIN) tablet   Oral   Take 1 tablet by mouth daily.         . polyethylene glycol (MIRALAX / GLYCOLAX) packet   Oral   Take 17 g by mouth daily as needed.           Allergies Review of patient's allergies indicates no known allergies.  Family History  Problem Relation Age of Onset  . Cancer Brother     throat ca  . Cancer Brother     bone cancer    Social History Social History  Substance Use Topics  . Smoking status: Never Smoker   . Smokeless tobacco: Never Used  . Alcohol Use: No    Review of Systems Constitutional: No fever/chills Eyes: No visual changes. ENT: Endorses sore throat. Cardiovascular: Denies chest pain. Respiratory: Denies shortness of breath. Endorses cough. Gastrointestinal: No abdominal pain.  No nausea, no vomiting.  No diarrhea.  No constipation. Genitourinary: Negative for dysuria. Musculoskeletal: Negative for back pain. Skin: Negative for rash. Neurological: Negative for headaches, focal weakness or numbness.  10-point ROS otherwise negative.  ____________________________________________   PHYSICAL EXAM:  VITAL SIGNS: ED Triage Vitals  Enc Vitals Group     BP 09/10/15 0845 167/81 mmHg     Pulse Rate 09/10/15 0845 72     Resp 09/10/15 0845 18     Temp 09/10/15 0845 97.6 F (36.4 C)     Temp Source 09/10/15 0845 Oral     SpO2 09/10/15 0845 94 %     Weight 09/10/15 0845 157 lb (71.215 kg)     Height 09/10/15 0845 5\' 5"  (1.651 m)     Head Cir --      Peak Flow --      Pain Score 09/10/15 0846 3     Pain Loc --      Pain Edu? --      Excl. in [place]? --     Constitutional: Alert and oriented. Well appearing and  in no acute distress. Eyes: Conjunctivae are normal. PERRL. EOMI. Head: Atraumatic. Nose: No congestion/rhinnorhea. Mouth/Throat: Mucous membranes are moist.  Oropharynx mildly erythematous. Neck: No stridor.   Hematological/Lymphatic/Immunilogical: Diffuse, mobile, nontender anterior cervical lymphadenopathy. Cardiovascular: Normal rate, regular rhythm. Grossly normal heart sounds.  Good peripheral circulation. Respiratory: Normal respiratory effort.  No retractions. Lungs with scattered coarse breath sounds. No wheezing, rales, rhonchi, absent or Decreased Breath Sounds. Gastrointestinal: Soft and nontender. No distention. No abdominal bruits. No CVA tenderness. Musculoskeletal: No lower extremity tenderness nor edema.  No joint effusions. Neurologic:  Normal speech and language. No gross focal neurologic deficits are appreciated. No gait instability. Skin:  Skin is warm, dry and intact. No rash noted. Psychiatric: Mood and affect are normal. Speech and behavior are normal.  ____________________________________________   LABS (all labs ordered are listed, but only abnormal results are displayed)  Labs Reviewed - No data to display ____________________________________________  EKG   ____________________________________________  RADIOLOGY   ____________________________________________   PROCEDURES  Procedure(s) performed: None  Critical Care performed: No  ____________________________________________   INITIAL IMPRESSION / ASSESSMENT AND PLAN / ED COURSE  Pertinent labs & imaging results that were available during my care of the patient were reviewed by me and considered in my medical decision making (see chart for details).  Patient's diagnosis is consistent with upper respiratory infection. Due to its 10 day course I will put the patient on antibiotics. Patient will receive prescriptions for cough syrup as well as magic mouthwash for symptom control. Patient  verbalizes understanding of her diagnosis and treatment plan and verbalizes compliance with same. ____________________________________________   FINAL CLINICAL IMPRESSION(S) / ED DIAGNOSES  Final diagnoses:  Bacterial upper respiratory infection      Darletta Moll, PA-C 09/10/15 1011  Eula Listen, MD 09/10/15 1552

## 2015-09-10 NOTE — ED Notes (Signed)
States she has had a occasional cough and sore throat for about 1 week. "just feels bad" unsure of fever

## 2016-01-24 ENCOUNTER — Encounter: Payer: Self-pay | Admitting: Emergency Medicine

## 2016-01-24 ENCOUNTER — Emergency Department
Admission: EM | Admit: 2016-01-24 | Discharge: 2016-01-24 | Disposition: A | Discharge status: Home / Self Care | Payer: Commercial Managed Care - HMO | Attending: Emergency Medicine | Admitting: Emergency Medicine

## 2016-01-24 ENCOUNTER — Emergency Department: Payer: Commercial Managed Care - HMO

## 2016-01-24 DIAGNOSIS — Z791 Long term (current) use of non-steroidal anti-inflammatories (NSAID): Secondary | ICD-10-CM | POA: Diagnosis not present

## 2016-01-24 DIAGNOSIS — Z90710 Acquired absence of both cervix and uterus: Secondary | ICD-10-CM | POA: Diagnosis not present

## 2016-01-24 DIAGNOSIS — I10 Essential (primary) hypertension: Secondary | ICD-10-CM | POA: Insufficient documentation

## 2016-01-24 DIAGNOSIS — Z7982 Long term (current) use of aspirin: Secondary | ICD-10-CM | POA: Diagnosis not present

## 2016-01-24 DIAGNOSIS — E785 Hyperlipidemia, unspecified: Secondary | ICD-10-CM | POA: Insufficient documentation

## 2016-01-24 DIAGNOSIS — Z792 Long term (current) use of antibiotics: Secondary | ICD-10-CM | POA: Diagnosis not present

## 2016-01-24 DIAGNOSIS — N179 Acute kidney failure, unspecified: Secondary | ICD-10-CM | POA: Diagnosis not present

## 2016-01-24 DIAGNOSIS — Z8572 Personal history of non-Hodgkin lymphomas: Secondary | ICD-10-CM | POA: Insufficient documentation

## 2016-01-24 DIAGNOSIS — M109 Gout, unspecified: Secondary | ICD-10-CM | POA: Diagnosis not present

## 2016-01-24 DIAGNOSIS — Z79899 Other long term (current) drug therapy: Secondary | ICD-10-CM | POA: Insufficient documentation

## 2016-01-24 DIAGNOSIS — M79671 Pain in right foot: Secondary | ICD-10-CM | POA: Diagnosis present

## 2016-01-24 LAB — URIC ACID: Uric Acid, Serum: 10 mg/dL — ABNORMAL HIGH (ref 2.3–6.6)

## 2016-01-24 MED ORDER — COLCHICINE 0.6 MG PO TABS
0.6000 mg | ORAL_TABLET | Freq: Every day | ORAL | Status: DC
Start: 2016-01-24 — End: 2016-04-11

## 2016-01-24 MED ORDER — INDOMETHACIN 50 MG PO CAPS: 50.0000 mg | ORAL_CAPSULE | Freq: Three times a day (TID) | ORAL | Status: DC | PRN

## 2016-01-24 NOTE — ED Provider Notes (Signed)
[facility] Emergency Department Provider Note  ____________________________________________  Time seen: Approximately 12:12 PM  I have reviewed the triage vital signs and the nursing notes.   HISTORY  Chief Complaint Foot Pain    HPI Kristy Solomon is a 82 y.o. female , NAD, presents to the emergency department with 2 week history of right great toe pain. States the pain has increased significantly over the last 24 hours and is at a severe level. States that nothing can touch the toe without severe pain. Has had to wear a house slipper as her regular shoes caused too much pain. Has not been able bear weight due to the pain. Denies any injuries, traumas, falls. Has taken ibuprofen without any relief. Has not noted any skin sores or open wounds. Denies any numbness, weakness, tingling. Denies any history of diabetes.   Past Medical History  Diagnosis Date  . Hypertension   . Hyperlipidemia   . 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 Thrombocytopenia ([place]) 07/04/2014  . Acute renal failure ([place]) 07/04/2014  . Hyperuricemia 07/04/2014  . 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 Hemorrhoid surgery    . Abdominal hysterectomy      Current Outpatient Rx  Name  Route  Sig  Dispense  Refill  . aspirin 81 MG chewable tablet   Oral   Chew 81 mg by mouth daily.         Marland Kitchen atorvastatin (LIPITOR) 20 MG tablet   Oral   Take 20 mg by mouth daily.         Marland Kitchen azithromycin (ZITHROMAX Z-PAK) 250 MG tablet      Take 2 tablets (500 mg) on  Day 1,  followed by 1 tablet (250 mg) once daily on Days 2 through 5.   6 each   0   . AZOR 5-40 MG per tablet   Oral   Take 1 tablet by mouth daily.         .  brompheniramine-pseudoephedrine-DM 30-2-10 MG/5ML syrup   Oral   Take 10 mLs by mouth 4 (four) times daily as needed.   200 mL   0   . colchicine 0.6 MG tablet   Oral   Take 1 tablet (0.6 mg total) by mouth daily. Take 2 tablets at onset of pain, then may take 1 more 1 hour later if pain continues.   3 tablet   0   . indomethacin (INDOCIN) 50 MG capsule   Oral   Take 1 capsule (50 mg total) by mouth 3 (three) times daily with meals as needed.   21 capsule   0   . lisinopril-hydrochlorothiazide (PRINZIDE,ZESTORETIC) 20-12.5 MG per tablet   Oral   Take 1 tablet by mouth daily.         . magic mouthwash w/lidocaine SOLN   Oral   Take 5 mLs by mouth 4 (four) times daily.   240 mL   0     Dispense in a 1/1/1/1 ratio. Use lidocaine, diphen ...   . Multiple Vitamin (MULTIVITAMIN) tablet   Oral   Take 1 tablet by mouth daily.         . polyethylene glycol (MIRALAX / GLYCOLAX) packet   Oral   Take 17 g by mouth daily as needed.  Allergies Review of patient's allergies indicates no known allergies.  Family History  Problem Relation Age of Onset  . Cancer Brother     throat ca  . Cancer Brother     bone cancer    Social History Social History  Substance Use Topics  . Smoking status: Never Smoker   . Smokeless tobacco: Never Used  . Alcohol Use: No     Review of Systems  Constitutional: No fever/chills or fatigue Cardiovascular: No chest pain. Respiratory:  No shortness of breath. No wheezing.  Gastrointestinal: No abdominal pain.  No nausea, vomiting.  Musculoskeletal: Positive for right great toe pain. Negative for right foot, ankle pain.  Skin: Positive redness, swelling about right great toe. Negative for rash. Neurological: Negative for headaches, focal weakness or numbness. No tingling 10-point ROS otherwise negative.  ____________________________________________   PHYSICAL EXAM:  VITAL SIGNS: ED Triage Vitals  Enc Vitals Group      BP 01/24/16 1114 178/70 mmHg     Pulse Rate 01/24/16 1114 65     Resp 01/24/16 1114 20     Temp 01/24/16 1117 98 F (36.7 C)     Temp Source 01/24/16 1117 Oral     SpO2 01/24/16 1114 100 %     Weight 01/24/16 1114 183 lb (83.008 kg)     Height 01/24/16 1114 5\' 3"  (1.6 m)     Head Cir --      Peak Flow --      Pain Score 01/24/16 1114 7     Pain Loc --      Pain Edu? --      Excl. in [place]? --      Constitutional: Alert and oriented. Well appearing and in no acute distress. Eyes: Conjunctivae are normal.  Head: Atraumatic. Cardiovascular: Normal rate, regular rhythm. Normal S1 and S2.  Good peripheral circulation with 2+ pulses noted in the right lower extremity. Respiratory: Normal respiratory effort without tachypnea or retractions. Lungs CTAB with breath sounds noted in all lung fields. Musculoskeletal: Significant tenderness to light palpation of the right great toe diffusely. Patient will not allow me to complete range of motion with the right great toe or right foot due to pain. No pain to palpation about the right ankle, right 2/3/4/5 toes or metatarsals. No lower extremity edema.  No joint effusions. Neurologic:  Normal speech and language. No gross focal neurologic deficits are appreciated.  Skin:  Skin about the base of the right great toe about the MTP is red, warm and mildly swollen. No open wounds or skin lesions are noted. Skin is warm, dry and intact. No rash noted. Psychiatric: Mood and affect are normal. Speech and behavior are normal. Patient exhibits appropriate insight and judgement.   ____________________________________________   LABS (all labs ordered are listed, but only abnormal results are displayed)  Labs Reviewed  URIC ACID - Abnormal; Notable for the following:    Uric Acid, Serum 10.0 (*)    All other components within normal limits    ____________________________________________  EKG  None ____________________________________________  RADIOLOGY I have personally viewed and evaluated these images (plain radiographs) as part of my medical decision making, as well as reviewing the written report by the radiologist.  Dg Toe Great Right  01/24/2016  CLINICAL DATA:  Pain and swelling to 1st digit of rt foot x 2-3 weeks that has worsened the last couple of days; no known injury EXAM: RIGHT GREAT TOE COMPARISON:  None. FINDINGS: There is no evidence of  fracture or dislocation. There is no evidence of arthropathy or other focal bone abnormality. Mild soft tissue swelling identified along the medial aspect of the foot and great toe. IMPRESSION: Soft tissue swelling. Electronically Signed   By: Nolon Nations M.D.   On: 01/24/2016 12:42    ____________________________________________    PROCEDURES  Procedure(s) performed: None    Medications - No data to display   ____________________________________________   INITIAL IMPRESSION / ASSESSMENT AND PLAN / ED COURSE  Pertinent labs & imaging results that were available during my care of the patient were reviewed by me and considered in my medical decision making (see chart for details).  Patient's diagnosis is consistent with acute gout of the right great toe. Patient will be discharged home with prescriptions for colchicine and indomethacin to take as directed. Patient is to follow up with her primary care provider in 2 weeks for recheck or sooner if symptoms persist. Patient is given ED precautions to return to the ED for any worsening or new symptoms.      ____________________________________________  FINAL CLINICAL IMPRESSION(S) / ED DIAGNOSES  Final diagnoses:  Acute gout of right foot, unspecified cause      NEW MEDICATIONS STARTED DURING THIS VISIT:  Discharge Medication List as of 01/24/2016 12:54 PM    START taking these medications   Details   colchicine 0.6 MG tablet Take 1 tablet (0.6 mg total) by mouth daily. Take 2 tablets at onset of pain, then may take 1 more 1 hour later if pain continues., Starting 01/24/2016, Until Discontinued, Print    indomethacin (INDOCIN) 50 MG capsule Take 1 capsule (50 mg total) by mouth 3 (three) times daily with meals as needed., Starting 01/24/2016, Until Discontinued, Print             Braxton Feathers, PA-C 01/24/16 [place], MD 01/24/16 [phone]

## 2016-01-24 NOTE — ED Notes (Signed)
Patient reports foot pain worsened from yesterday to today.  2-3 weeks ago she was soaking in warm water for the pain which helped.  Pt states from her right great toe down is painful.  Denies any injury. Some swelling noted on right great toe. Took 2 ibuprofen last night which did not help.

## 2016-01-24 NOTE — Discharge Instructions (Signed)
Low-Purine Diet Purines are compounds that affect the level of uric acid in your body. A low-purine diet is a diet that is low in purines. Eating a low-purine diet can prevent the level of uric acid in your body from getting too high and causing gout or kidney stones or both. WHAT DO I NEED TO KNOW ABOUT THIS DIET?  Choose low-purine foods. Examples of low-purine foods are listed in the next section.  Drink plenty of fluids, especially water. Fluids can help remove uric acid from your body. Try to drink 8-16 cups (1.9-3.8 L) a day.  Limit foods high in fat, especially saturated fat, as fat makes it harder for the body to get rid of uric acid. Foods high in saturated fat include pizza, cheese, ice cream, whole milk, fried foods, and gravies. Choose foods that are lower in fat and lean sources of protein. Use olive oil when cooking as it contains healthy fats that are not high in saturated fat.  Limit alcohol. Alcohol interferes with the elimination of uric acid from your body. If you are having a gout attack, avoid all alcohol.  Keep in mind that different people's bodies react differently to different foods. You will probably learn over time which foods do or do not affect you. If you discover that a food tends to cause your gout to flare up, avoid eating that food. You can more freely enjoy foods that do not cause problems. If you have any questions about a food item, talk to your dietitian or health care provider. WHICH FOODS ARE LOW, MODERATE, AND HIGH IN PURINES? The following is a list of foods that are low, moderate, and high in purines. You can eat any amount of the foods that are low in purines. You may be able to have small amounts of foods that are moderate in purines. Ask your health care provider how much of a food moderate in purines you can have. Avoid foods high in purines. Grains  Foods low in purines: Enriched white bread, pasta, rice, cake, cornbread, popcorn.  Foods moderate in  purines: Whole-grain breads and cereals, wheat germ, bran, oatmeal. Uncooked oatmeal. Dry wheat bran or wheat germ.  Foods high in purines: Pancakes, [place] toast, biscuits, muffins. Vegetables  Foods low in purines: All vegetables, except those that are moderate in purines.  Foods moderate in purines: Asparagus, cauliflower, spinach, mushrooms, green peas. Fruits  All fruits are low in purines. Meats and other Protein Foods  Foods low in purines: Eggs, nuts, peanut butter.  Foods moderate in purines: 80-90% lean beef, lamb, veal, pork, poultry, fish, eggs, peanut butter, nuts. Crab, lobster, oysters, and shrimp. Cooked dried beans, peas, and lentils.  Foods high in purines: Anchovies, sardines, herring, mussels, tuna, codfish, scallops, trout, and haddock. Berniece Salines. Organ meats (such as liver or kidney). Tripe. Game meat. Goose. Sweetbreads. Dairy  All dairy foods are low in purines. Low-fat and fat-free dairy products are best because they are low in saturated fat. Beverages  Drinks low in purines: Water, carbonated beverages, tea, coffee, cocoa.  Drinks moderate in purines: Soft drinks and other drinks sweetened with high-fructose corn syrup. Juices. To find whether a food or drink is sweetened with high-fructose corn syrup, look at the ingredients list.  Drinks high in purines: Alcoholic beverages (such as beer). Condiments  Foods low in purines: Salt, herbs, olives, pickles, relishes, vinegar.  Foods moderate in purines: Butter, margarine, oils, mayonnaise. Fats and Oils  Foods low in purines: All types, except gravies  and sauces made with meat.  Foods high in purines: Gravies and sauces made with meat. Other Foods  Foods low in purines: Sugars, sweets, gelatin. Cake. Soups made without meat.  Foods moderate in purines: Meat-based or fish-based soups, broths, or bouillons. Foods and drinks sweetened with high-fructose corn syrup.  Foods high in purines: High-fat desserts  (such as ice cream, cookies, cakes, pies, doughnuts, and chocolate). Contact your dietitian for more information on foods that are not listed here.   This information is not intended to replace advice given to you by your health care provider. Make sure you discuss any questions you have with your health care provider.   Document Released: 01/14/2011 Document Revised: 09/24/2013 Document Reviewed: 08/26/2013 Elsevier Interactive Patient Education 2016 [employer].  Gout Gout is an inflammatory arthritis caused by a buildup of uric acid crystals in the joints. Uric acid is a chemical that is normally present in the blood. When the level of uric acid in the blood is too high it can form crystals that deposit in your joints and tissues. This causes joint redness, soreness, and swelling (inflammation). Repeat attacks are common. Over time, uric acid crystals can form into masses (tophi) near a joint, destroying bone and causing disfigurement. Gout is treatable and often preventable. CAUSES  The disease begins with elevated levels of uric acid in the blood. Uric acid is produced by your body when it breaks down a naturally found substance called purines. Certain foods you eat, such as meats and fish, contain high amounts of purines. Causes of an elevated uric acid level include:  Being passed down from parent to child (heredity).  Diseases that cause increased uric acid production (such as obesity, psoriasis, and certain cancers).  Excessive alcohol use.  Diet, especially diets rich in meat and seafood.  Medicines, including certain cancer-fighting medicines (chemotherapy), water pills (diuretics), and aspirin.  Chronic kidney disease. The kidneys are no longer able to remove uric acid well.  Problems with metabolism. Conditions strongly associated with gout include:  Obesity.  High blood pressure.  High cholesterol.  Diabetes. Not everyone with elevated uric acid levels gets gout. It  is not understood why some people get gout and others do not. Surgery, joint injury, and eating too much of certain foods are some of the factors that can lead to gout attacks. SYMPTOMS   An attack of gout comes on quickly. It causes intense pain with redness, swelling, and warmth in a joint.  Fever can occur.  Often, only one joint is involved. Certain joints are more commonly involved:  Base of the big toe.  Knee.  Ankle.  Wrist.  Finger. Without treatment, an attack usually goes away in a few days to weeks. Between attacks, you usually will not have symptoms, which is different from many other forms of arthritis. DIAGNOSIS  Your caregiver will suspect gout based on your symptoms and exam. In some cases, tests may be recommended. The tests may include:  Blood tests.  Urine tests.  X-rays.  Joint fluid exam. This exam requires a needle to remove fluid from the joint (arthrocentesis). Using a microscope, gout is confirmed when uric acid crystals are seen in the joint fluid. TREATMENT  There are two phases to gout treatment: treating the sudden onset (acute) attack and preventing attacks (prophylaxis).  Treatment of an Acute Attack.  Medicines are used. These include anti-inflammatory medicines or steroid medicines.  An injection of steroid medicine into the affected joint is sometimes necessary.  The  painful joint is rested. Movement can worsen the arthritis.  You may use warm or cold treatments on painful joints, depending which works best for you.  Treatment to Prevent Attacks.  If you suffer from frequent gout attacks, your caregiver may advise preventive medicine. These medicines are started after the acute attack subsides. These medicines either help your kidneys eliminate uric acid from your body or decrease your uric acid production. You may need to stay on these medicines for a very long time.  The early phase of treatment with preventive medicine can be  associated with an increase in acute gout attacks. For this reason, during the first few months of treatment, your caregiver may also advise you to take medicines usually used for acute gout treatment. Be sure you understand your caregiver's directions. Your caregiver may make several adjustments to your medicine dose before these medicines are effective.  Discuss dietary treatment with your caregiver or dietitian. Alcohol and drinks high in sugar and fructose and foods such as meat, poultry, and seafood can increase uric acid levels. Your caregiver or dietitian can advise you on drinks and foods that should be limited. HOME CARE INSTRUCTIONS   Do not take aspirin to relieve pain. This raises uric acid levels.  Only take over-the-counter or prescription medicines for pain, discomfort, or fever as directed by your caregiver.  Rest the joint as much as possible. When in bed, keep sheets and blankets off painful areas.  Keep the affected joint raised (elevated).  Apply warm or cold treatments to painful joints. Use of warm or cold treatments depends on which works best for you.  Use crutches if the painful joint is in your leg.  Drink enough fluids to keep your urine clear or pale yellow. This helps your body get rid of uric acid. Limit alcohol, sugary drinks, and fructose drinks.  Follow your dietary instructions. Pay careful attention to the amount of protein you eat. Your daily diet should emphasize fruits, vegetables, whole grains, and fat-free or low-fat milk products. Discuss the use of coffee, vitamin C, and cherries with your caregiver or dietitian. These may be helpful in lowering uric acid levels.  Maintain a healthy body weight. SEEK MEDICAL CARE IF:   You develop diarrhea, vomiting, or any side effects from medicines.  You do not feel better in 24 hours, or you are getting worse. SEEK IMMEDIATE MEDICAL CARE IF:   Your joint becomes suddenly more tender, and you have chills or a  fever. MAKE SURE YOU:   Understand these instructions.  Will watch your condition.  Will get help right away if you are not doing well or get worse.   This information is not intended to replace advice given to you by your health care provider. Make sure you discuss any questions you have with your health care provider.   Document Released: 09/16/2000 Document Revised: 10/10/2014 Document Reviewed: 05/02/2012 Elsevier Interactive Patient Education [employer].

## 2016-01-24 NOTE — ED Notes (Signed)
NAD noted at time of D/C. Pt taken to the lobby via wheelchair. Pt denies comments/concerns at this time.

## 2016-03-09 ENCOUNTER — Other Ambulatory Visit: Payer: Self-pay | Admitting: Internal Medicine

## 2016-03-09 DIAGNOSIS — Z1231 Encounter for screening mammogram for malignant neoplasm of breast: Secondary | ICD-10-CM

## 2016-03-17 ENCOUNTER — Inpatient Hospital Stay
Admission: EM | Admit: 2016-03-17 | Discharge: 2016-03-20 | DRG: 194 | Disposition: A | Discharge status: Home Health | Payer: Medicare HMO | Attending: Internal Medicine | Admitting: Internal Medicine

## 2016-03-17 ENCOUNTER — Emergency Department: Payer: Medicare HMO

## 2016-03-17 ENCOUNTER — Encounter: Payer: Self-pay | Admitting: Radiology

## 2016-03-17 DIAGNOSIS — E785 Hyperlipidemia, unspecified: Secondary | ICD-10-CM | POA: Diagnosis present

## 2016-03-17 DIAGNOSIS — C8308 Small cell B-cell lymphoma, lymph nodes of multiple sites: Secondary | ICD-10-CM | POA: Diagnosis present

## 2016-03-17 DIAGNOSIS — J189 Pneumonia, unspecified organism: Principal | ICD-10-CM | POA: Diagnosis present

## 2016-03-17 DIAGNOSIS — N183 Chronic kidney disease, stage 3 (moderate): Secondary | ICD-10-CM | POA: Diagnosis present

## 2016-03-17 DIAGNOSIS — C880 Waldenstrom macroglobulinemia: Secondary | ICD-10-CM | POA: Diagnosis present

## 2016-03-17 DIAGNOSIS — L8990 Pressure ulcer of unspecified site, unspecified stage: Secondary | ICD-10-CM | POA: Insufficient documentation

## 2016-03-17 DIAGNOSIS — E041 Nontoxic single thyroid nodule: Secondary | ICD-10-CM | POA: Diagnosis present

## 2016-03-17 DIAGNOSIS — E790 Hyperuricemia without signs of inflammatory arthritis and tophaceous disease: Secondary | ICD-10-CM | POA: Diagnosis not present

## 2016-03-17 DIAGNOSIS — Z79899 Other long term (current) drug therapy: Secondary | ICD-10-CM | POA: Diagnosis not present

## 2016-03-17 DIAGNOSIS — Z808 Family history of malignant neoplasm of other organs or systems: Secondary | ICD-10-CM

## 2016-03-17 DIAGNOSIS — E611 Iron deficiency: Secondary | ICD-10-CM | POA: Diagnosis present

## 2016-03-17 DIAGNOSIS — I129 Hypertensive chronic kidney disease with stage 1 through stage 4 chronic kidney disease, or unspecified chronic kidney disease: Secondary | ICD-10-CM | POA: Diagnosis present

## 2016-03-17 DIAGNOSIS — R634 Abnormal weight loss: Secondary | ICD-10-CM | POA: Diagnosis not present

## 2016-03-17 DIAGNOSIS — E538 Deficiency of other specified B group vitamins: Secondary | ICD-10-CM | POA: Diagnosis present

## 2016-03-17 DIAGNOSIS — R0902 Hypoxemia: Secondary | ICD-10-CM | POA: Diagnosis present

## 2016-03-17 DIAGNOSIS — C8300 Small cell B-cell lymphoma, unspecified site: Secondary | ICD-10-CM | POA: Diagnosis present

## 2016-03-17 DIAGNOSIS — D696 Thrombocytopenia, unspecified: Secondary | ICD-10-CM | POA: Diagnosis present

## 2016-03-17 DIAGNOSIS — N179 Acute kidney failure, unspecified: Secondary | ICD-10-CM | POA: Diagnosis present

## 2016-03-17 DIAGNOSIS — R5383 Other fatigue: Secondary | ICD-10-CM | POA: Diagnosis not present

## 2016-03-17 HISTORY — DX: Disorder of kidney and ureter, unspecified: N28.9

## 2016-03-17 LAB — BASIC METABOLIC PANEL
ANION GAP: 12 (ref 5–15)
BUN: 30 mg/dL — ABNORMAL HIGH (ref 6–20)
CALCIUM: 9.7 mg/dL (ref 8.9–10.3)
CO2: 21 mmol/L — ABNORMAL LOW (ref 22–32)
Chloride: 105 mmol/L (ref 101–111)
Creatinine, Ser: 1.38 mg/dL — ABNORMAL HIGH (ref 0.44–1.00)
GFR, EST AFRICAN AMERICAN: 39 mL/min — AB (ref >60)
GFR, EST NON AFRICAN AMERICAN: 34 mL/min — AB (ref >60)
Glucose, Bld: 101 mg/dL — ABNORMAL HIGH (ref 65–99)
Potassium: 3.5 mmol/L (ref 3.5–5.1)
SODIUM: 138 mmol/L (ref 135–145)

## 2016-03-17 LAB — CBC
HCT: 29.7 % — ABNORMAL LOW (ref 35.0–47.0)
HEMOGLOBIN: 9.8 g/dL — AB (ref 12.0–16.0)
MCH: 22.5 pg — ABNORMAL LOW (ref 26.0–34.0)
MCHC: 33 g/dL (ref 32.0–36.0)
MCV: 68.2 fL — ABNORMAL LOW (ref 80.0–100.0)
PLATELETS: 79 10*3/uL — AB (ref 150–440)
RBC: 4.35 MIL/uL (ref 3.80–5.20)
RDW: 20.6 % — ABNORMAL HIGH (ref 11.5–14.5)
WBC: 5.6 10*3/uL (ref 3.6–11.0)

## 2016-03-17 LAB — TROPONIN I

## 2016-03-17 MED ORDER — HEPARIN SODIUM (PORCINE) 5000 UNIT/ML IJ SOLN
5000.0000 [IU] | Freq: Three times a day (TID) | INTRAMUSCULAR | Status: DC
  Administered 2016-03-17: 5000 [IU] via SUBCUTANEOUS
  Filled 2016-03-17: qty 1

## 2016-03-17 MED ORDER — LISINOPRIL 20 MG PO TABS
20.0000 mg | ORAL_TABLET | Freq: Every day | ORAL | Status: DC
  Administered 2016-03-17 – 2016-03-20 (×4): 20 mg via ORAL
  Filled 2016-03-17 (×4): qty 1

## 2016-03-17 MED ORDER — DEXTROSE 5 % IV SOLN
500.0000 mg | INTRAVENOUS | Status: DC
  Administered 2016-03-18 – 2016-03-19 (×2): 500 mg via INTRAVENOUS
  Filled 2016-03-17 (×2): qty 500

## 2016-03-17 MED ORDER — LEVOFLOXACIN IN D5W 500 MG/100ML IV SOLN
500.0000 mg | Freq: Once | INTRAVENOUS | Status: AC
  Administered 2016-03-17: 500 mg via INTRAVENOUS
  Filled 2016-03-17: qty 100

## 2016-03-17 MED ORDER — IPRATROPIUM-ALBUTEROL 0.5-2.5 (3) MG/3ML IN SOLN
3.0000 mL | Freq: Four times a day (QID) | RESPIRATORY_TRACT | Status: DC
  Administered 2016-03-17 – 2016-03-18 (×4): 3 mL via RESPIRATORY_TRACT
  Filled 2016-03-17 (×4): qty 3

## 2016-03-17 MED ORDER — ACETAMINOPHEN 325 MG PO TABS
650.0000 mg | ORAL_TABLET | Freq: Four times a day (QID) | ORAL | Status: DC | PRN
  Administered 2016-03-17: 650 mg via ORAL
  Filled 2016-03-17: qty 2

## 2016-03-17 MED ORDER — POLYETHYLENE GLYCOL 3350 17 G PO PACK: 17.0000 g | PACK | Freq: Every day | ORAL | Status: DC | PRN

## 2016-03-17 MED ORDER — LISINOPRIL-HYDROCHLOROTHIAZIDE 20-12.5 MG PO TABS: 1.0000 | ORAL_TABLET | Freq: Every day | ORAL | Status: DC

## 2016-03-17 MED ORDER — ONDANSETRON HCL 4 MG/2ML IJ SOLN
4.0000 mg | Freq: Four times a day (QID) | INTRAMUSCULAR | Status: DC | PRN
  Administered 2016-03-18: 13:00:00 4 mg via INTRAVENOUS
  Filled 2016-03-17: qty 2

## 2016-03-17 MED ORDER — ATORVASTATIN CALCIUM 20 MG PO TABS
20.0000 mg | ORAL_TABLET | Freq: Every day | ORAL | Status: DC
  Administered 2016-03-17 – 2016-03-20 (×4): 20 mg via ORAL
  Filled 2016-03-17 (×4): qty 1

## 2016-03-17 MED ORDER — COLCHICINE 0.6 MG PO TABS
0.3000 mg | ORAL_TABLET | Freq: Every day | ORAL | Status: DC
  Administered 2016-03-18 – 2016-03-20 (×3): 0.3 mg via ORAL
  Filled 2016-03-17 (×3): qty 1

## 2016-03-17 MED ORDER — SODIUM CHLORIDE 0.9% FLUSH: 3.0000 mL | INTRAVENOUS | Status: DC | PRN

## 2016-03-17 MED ORDER — METOPROLOL TARTRATE 25 MG PO TABS
25.0000 mg | ORAL_TABLET | Freq: Two times a day (BID) | ORAL | Status: DC
  Administered 2016-03-17 – 2016-03-20 (×7): 25 mg via ORAL
  Filled 2016-03-17 (×7): qty 1

## 2016-03-17 MED ORDER — SODIUM CHLORIDE 0.9 % IV SOLN: 250.0000 mL | INTRAVENOUS | Status: DC | PRN

## 2016-03-17 MED ORDER — COLCHICINE 0.6 MG PO TABS
0.6000 mg | ORAL_TABLET | Freq: Every day | ORAL | Status: DC
  Administered 2016-03-17: 0.6 mg via ORAL
  Filled 2016-03-17: qty 1

## 2016-03-17 MED ORDER — PANTOPRAZOLE SODIUM 40 MG PO TBEC
40.0000 mg | DELAYED_RELEASE_TABLET | Freq: Every day | ORAL | Status: DC
  Administered 2016-03-17 – 2016-03-20 (×4): 40 mg via ORAL
  Filled 2016-03-17 (×4): qty 1

## 2016-03-17 MED ORDER — HYDRALAZINE HCL 20 MG/ML IJ SOLN: 10.0000 mg | INTRAMUSCULAR | Status: DC | PRN

## 2016-03-17 MED ORDER — DIATRIZOATE MEGLUMINE & SODIUM 66-10 % PO SOLN
15.0000 mL | Freq: Once | ORAL | Status: AC
  Administered 2016-03-17: 15 mL via ORAL

## 2016-03-17 MED ORDER — GUAIFENESIN 100 MG/5ML PO SOLN: 5.0000 mL | ORAL | Status: DC | PRN

## 2016-03-17 MED ORDER — AMLODIPINE-OLMESARTAN 5-40 MG PO TABS: 1.0000 | ORAL_TABLET | Freq: Every day | ORAL | Status: DC

## 2016-03-17 MED ORDER — IOPAMIDOL (ISOVUE-300) INJECTION 61%
80.0000 mL | Freq: Once | INTRAVENOUS | Status: AC | PRN
  Administered 2016-03-17: 75 mL via INTRAVENOUS

## 2016-03-17 MED ORDER — HYDROCHLOROTHIAZIDE 12.5 MG PO CAPS
12.5000 mg | ORAL_CAPSULE | Freq: Every day | ORAL | Status: DC
  Administered 2016-03-17 – 2016-03-20 (×3): 12.5 mg via ORAL
  Filled 2016-03-17 (×4): qty 1

## 2016-03-17 MED ORDER — ENOXAPARIN SODIUM 30 MG/0.3ML ~~LOC~~ SOLN
30.0000 mg | SUBCUTANEOUS | Status: DC
  Administered 2016-03-17: 30 mg via SUBCUTANEOUS
  Filled 2016-03-17: qty 0.3

## 2016-03-17 MED ORDER — AMLODIPINE BESYLATE 5 MG PO TABS
5.0000 mg | ORAL_TABLET | Freq: Every day | ORAL | Status: DC
  Administered 2016-03-17: 5 mg via ORAL
  Filled 2016-03-17: qty 1

## 2016-03-17 MED ORDER — IRBESARTAN 75 MG PO TABS
300.0000 mg | ORAL_TABLET | Freq: Every day | ORAL | Status: DC
  Administered 2016-03-17: 300 mg via ORAL
  Filled 2016-03-17: qty 2

## 2016-03-17 MED ORDER — SODIUM CHLORIDE 0.9% FLUSH
3.0000 mL | Freq: Two times a day (BID) | INTRAVENOUS | Status: DC
  Administered 2016-03-17 (×2): 3 mL via INTRAVENOUS

## 2016-03-17 MED ORDER — CEFTRIAXONE SODIUM 1 G IJ SOLR
1.0000 g | INTRAMUSCULAR | Status: DC
  Administered 2016-03-17 – 2016-03-20 (×4): 1 g via INTRAVENOUS
  Filled 2016-03-17 (×4): qty 10

## 2016-03-17 MED ORDER — SENNOSIDES-DOCUSATE SODIUM 8.6-50 MG PO TABS
1.0000 | ORAL_TABLET | Freq: Every evening | ORAL | Status: DC | PRN
  Administered 2016-03-19: 1 via ORAL
  Filled 2016-03-17: qty 1

## 2016-03-17 NOTE — ED Provider Notes (Signed)
[facility] Emergency Department Provider Note  ____________________________________________  Time seen: 12:50 AM  I have reviewed the triage vital signs and the nursing notes.   HISTORY  Chief Complaint Shortness of Breath     HPI Kristy Solomon is a 82 y.o. female presents with progressive dyspnea and generalized abdominal pain "for a few weeks". Patient denies any nausea no vomiting or diarrhea. Patient denies any dysuria or urinary urgency or frequency. Patient denies any chest pain. Patient noted to have an oxygen saturation on presentation and 84% on room air. Patient only admits to hypertension has a past medical history.    Past Medical History  Diagnosis Date  . Hypertension   . Hyperlipidemia   . 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 Renal insufficiency     Patient Active Problem List   Diagnosis Date Noted  . Thrombocytopenia ([place]) 07/04/2014  . Acute renal failure ([place]) 07/04/2014  . Hyperuricemia 07/04/2014  . 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 Hemorrhoid surgery    . Abdominal hysterectomy      Current Outpatient Rx  Name  Route  Sig  Dispense  Refill  . atorvastatin (LIPITOR) 20 MG tablet   Oral   Take 20 mg by mouth daily.         . AZOR 5-40 MG per tablet   Oral   Take 1 tablet by mouth daily.         . colchicine 0.6 MG tablet   Oral   Take 1 tablet (0.6 mg total) by mouth daily. Take 2 tablets at onset of pain, then may take 1 more 1 hour later if pain continues.   3 tablet   0   . indomethacin (INDOCIN) 50 MG capsule   Oral   Take 1 capsule (50 mg total) by mouth 3 (three) times daily with meals as needed. Patient taking differently: Take 50 mg by mouth 3 (three) times daily with meals as needed for mild pain.    21 capsule    0   . lisinopril-hydrochlorothiazide (PRINZIDE,ZESTORETIC) 20-12.5 MG per tablet   Oral   Take 1 tablet by mouth daily.         . metoprolol tartrate (LOPRESSOR) 25 MG tablet   Oral   Take 25 mg by mouth 2 (two) times daily.      0   . omeprazole (PRILOSEC) 20 MG capsule   Oral   Take 20 mg by mouth daily.      0   . polyethylene glycol (MIRALAX / GLYCOLAX) packet   Oral   Take 17 g by mouth daily as needed for mild constipation.            Allergies No known drug allergies  Family History  Problem Relation Age of Onset  . Cancer Brother     throat ca  . Cancer Brother     bone cancer    Social History Social History  Substance Use Topics  . Smoking status: Never Smoker   . Smokeless tobacco: Never Used  . Alcohol Use: No    Review of Systems  Constitutional: Negative for fever. Eyes: Negative for visual changes. ENT: Negative for sore throat. Cardiovascular: Negative for chest pain. Respiratory: Positive for shortness of breath. Gastrointestinal: Positive for generalized abdominal pain Genitourinary: Negative for dysuria. Musculoskeletal: Negative for back  pain. Skin: Negative for rash. Neurological: Negative for headaches, focal weakness or numbness.   10-point ROS otherwise negative.  ____________________________________________   PHYSICAL EXAM:  VITAL SIGNS: ED Triage Vitals  Enc Vitals Group     BP 03/17/16 0008 166/82 mmHg     Pulse Rate 03/17/16 0007 71     Resp 03/17/16 0007 18     Temp 03/17/16 0007 97.5 F (36.4 C)     Temp Source 03/17/16 0007 Oral     SpO2 03/17/16 0007 98 %     Weight 03/17/16 0007 173 lb (78.472 kg)     Height 03/17/16 0007 5' (1.524 m)     Head Cir --      Peak Flow --      Pain Score 03/17/16 0007 8     Pain Loc --      Pain Edu? --      Excl. in [place]? --      Constitutional: Alert and oriented. Well appearing and in no distress. Eyes: Conjunctivae are normal. PERRL. Normal extraocular  movements. ENT   Head: Normocephalic and atraumatic.   Nose: No congestion/rhinnorhea.   Mouth/Throat: Mucous membranes are moist.   Neck: No stridor. Hematological/Lymphatic/Immunilogical: No cervical lymphadenopathy. Cardiovascular: Normal rate, regular rhythm. Normal and symmetric distal pulses are present in all extremities. No murmurs, rubs, or gallops. Respiratory: Normal respiratory effort without tachypnea nor retractions. Breath sounds are clear and equal bilaterally. Diffuse rhonchi. Gastrointestinal: Soft and nontender. No distention. There is no CVA tenderness. Genitourinary: deferred Musculoskeletal: Nontender with normal range of motion in all extremities. No joint effusions.  No lower extremity tenderness nor edema. Neurologic:  Normal speech and language. No gross focal neurologic deficits are appreciated. Speech is normal.  Skin:  Skin is warm, dry and intact. No rash noted. Psychiatric: Mood and affect are normal. Speech and behavior are normal. Patient exhibits appropriate insight and judgment.  ____________________________________________    LABS (pertinent positives/negatives)  Labs Reviewed  BASIC METABOLIC PANEL - Abnormal; Notable for the following:    CO2 21 (*)    Glucose, Bld 101 (*)    BUN 30 (*)    Creatinine, Ser 1.38 (*)    GFR calc non Af Amer 34 (*)    GFR calc Af Amer 39 (*)    All other components within normal limits  CBC - Abnormal; Notable for the following:    Hemoglobin 9.8 (*)    HCT 29.7 (*)    MCV 68.2 (*)    MCH 22.5 (*)    RDW 20.6 (*)    Platelets 79 (*)    All other components within normal limits  TROPONIN I    ____________________________________________   EKG  ED ECG REPORT I, Henderson N Bond Grieshop, the attending physician, personally viewed and interpreted this ECG.   Date: 03/17/2016  EKG Time: 12:32 AM  Rate: 64  Rhythm: Normal sinus rhythm  Axis: Normal  Intervals: Normal  ST&T Change:  None   ____________________________________________    RADIOLOGY  CT Chest W Contrast (Final result) Result time: 03/17/16 05:05:53   Final result by Rad Results In Interface (03/17/16 05:05:53)   Narrative:   CLINICAL DATA: Shortness of breath and right abdominal pain radiating to the left side for a few weeks. History of lymphoma.  EXAM: CT CHEST, ABDOMEN, AND PELVIS WITH CONTRAST  TECHNIQUE: Multidetector CT imaging of the chest, abdomen and pelvis was performed following the standard protocol during bolus administration of intravenous contrast.  CONTRAST: 88mL  ISOVUE-300 IOPAMIDOL (ISOVUE-300) INJECTION 61%  COMPARISON: CT abdomen and pelvis 12/13/2014  FINDINGS: CT CHEST FINDINGS  Mediastinum/Lymph Nodes: Normal heart size. Coronary artery calcifications. Normal caliber thoracic aorta. Esophagus is decompressed. Enlarged and heterogeneous left thyroid nodule with retrosternal extension.  Lungs/Pleura: Evaluation of lungs is limited due to motion artifact. There are focal areas of masslike consolidation in the lung bases with patchy nodular infiltrative lesions throughout both lungs. There is significant progression since previous study. Changes could be due 2 lymphoma, metastatic disease, or septic emboli with multiple nodular areas of infection. Fungal infection could also have this appearance.  Musculoskeletal: There soft tissue nodules demonstrated throughout the subcutaneous fat of the chest wall with axillary and supraclavicular lymphadenopathy. This likely represents lymphoproliferative disorder with diffuse soft tissue lymphoma. Similar findings are present on the previous study but there is likely progression.  CT ABDOMEN PELVIS FINDINGS  Hepatobiliary: No masses or other significant abnormality.  Pancreas: No mass, inflammatory changes, or other significant abnormality.  Spleen: Within normal limits in size and  appearance.  Adrenals/Urinary Tract: Large stones demonstrated in both kidneys without hydronephrosis.  Stomach/Bowel: Small esophageal hiatal hernia. Stomach is decompressed. Small bowel are decompressed. Suggestion of small bowel wall thickening in the pelvis likely to represent lymphomatous involvement. No evidence of obstruction.  Vascular/Lymphatic: There are markedly enlarged lymph nodes throughout the retroperitoneum, mesenteric, pelvis, and throughout the subcutaneous fat. Massive retroperitoneal lymphadenopathy in the left periaortic space causes displacement and deviation of the left kidney with probable direct invasion of the left kidney. This mass measures up to about 13.8 by 10.7 cm. There is progression since previous study. Diffuse calcification of abdominal aorta without aneurysm.  Reproductive: Uterus is surgically absent.  Other: None.  Musculoskeletal: No destructive bone lesions. Degenerative changes in the spine.  IMPRESSION: Progression of diffuse lymphoma with subcutaneous deposits demonstrated throughout the subcutaneous fat of the chest, abdomen, and pelvis. Multiple nodular infiltrates throughout the lungs, likely due to lymphoma but could also represent infection or atypical infection. Massive retroperitoneal lymphadenopathy with diffuse retroperitoneal, mesenteric, and pelvic lymphadenopathy throughout. Wall thickening and pelvic small bowel probably represents lymphomas involvement. Probable direct invasion of the left kidney. Staghorn calculi in both kidneys.   Electronically Signed By: Lucienne Capers M.D. On: 03/17/2016 05:05          CT Abdomen Pelvis W Contrast (Final result) Result time: 03/17/16 05:05:53   Final result by Rad Results In Interface (03/17/16 05:05:53)   Narrative:   CLINICAL DATA: Shortness of breath and right abdominal pain radiating to the left side for a few weeks. History of lymphoma.  EXAM: CT  CHEST, ABDOMEN, AND PELVIS WITH CONTRAST  TECHNIQUE: Multidetector CT imaging of the chest, abdomen and pelvis was performed following the standard protocol during bolus administration of intravenous contrast.  CONTRAST: 86mL ISOVUE-300 IOPAMIDOL (ISOVUE-300) INJECTION 61%  COMPARISON: CT abdomen and pelvis 12/13/2014  FINDINGS: CT CHEST FINDINGS  Mediastinum/Lymph Nodes: Normal heart size. Coronary artery calcifications. Normal caliber thoracic aorta. Esophagus is decompressed. Enlarged and heterogeneous left thyroid nodule with retrosternal extension.  Lungs/Pleura: Evaluation of lungs is limited due to motion artifact. There are focal areas of masslike consolidation in the lung bases with patchy nodular infiltrative lesions throughout both lungs. There is significant progression since previous study. Changes could be due 2 lymphoma, metastatic disease, or septic emboli with multiple nodular areas of infection. Fungal infection could also have this appearance.  Musculoskeletal: There soft tissue nodules demonstrated throughout the subcutaneous fat of the chest wall with  axillary and supraclavicular lymphadenopathy. This likely represents lymphoproliferative disorder with diffuse soft tissue lymphoma. Similar findings are present on the previous study but there is likely progression.  CT ABDOMEN PELVIS FINDINGS  Hepatobiliary: No masses or other significant abnormality.  Pancreas: No mass, inflammatory changes, or other significant abnormality.  Spleen: Within normal limits in size and appearance.  Adrenals/Urinary Tract: Large stones demonstrated in both kidneys without hydronephrosis.  Stomach/Bowel: Small esophageal hiatal hernia. Stomach is decompressed. Small bowel are decompressed. Suggestion of small bowel wall thickening in the pelvis likely to represent lymphomatous involvement. No evidence of obstruction.  Vascular/Lymphatic: There are markedly enlarged  lymph nodes throughout the retroperitoneum, mesenteric, pelvis, and throughout the subcutaneous fat. Massive retroperitoneal lymphadenopathy in the left periaortic space causes displacement and deviation of the left kidney with probable direct invasion of the left kidney. This mass measures up to about 13.8 by 10.7 cm. There is progression since previous study. Diffuse calcification of abdominal aorta without aneurysm.  Reproductive: Uterus is surgically absent.  Other: None.  Musculoskeletal: No destructive bone lesions. Degenerative changes in the spine.  IMPRESSION: Progression of diffuse lymphoma with subcutaneous deposits demonstrated throughout the subcutaneous fat of the chest, abdomen, and pelvis. Multiple nodular infiltrates throughout the lungs, likely due to lymphoma but could also represent infection or atypical infection. Massive retroperitoneal lymphadenopathy with diffuse retroperitoneal, mesenteric, and pelvic lymphadenopathy throughout. Wall thickening and pelvic small bowel probably represents lymphomas involvement. Probable direct invasion of the left kidney. Staghorn calculi in both kidneys.   Electronically Signed By: Lucienne Capers M.D. On: 03/17/2016 05:05          DG Chest Port 1 View (Final result) Result time: 03/17/16 01:14:46   Final result by Rad Results In Interface (03/17/16 01:14:46)   Narrative:   CLINICAL DATA: Shortness of breath and right-sided abdominal pain radiating to the left for a few weeks. Decreased oxygen saturation.  EXAM: PORTABLE CHEST 1 VIEW  COMPARISON: None.  FINDINGS: Cardiac enlargement. Pulmonary vascularity appears normal for technique. Rounded opacity in the right lung base may represent rounded pneumonia or mass lesion. CT suggested for further evaluation. Probable infiltration or atelectasis in the left lung base behind the heart as well. No blunting of costophrenic angles. No pneumothorax.  Calcification of the aorta. Degenerative changes in the spine and shoulders.  IMPRESSION: Cardiac enlargement. Mass versus consolidation in the right lower lung. Probable consolidation in the left lung base. CT suggested for further evaluation.   Electronically Signed By: Lucienne Capers M.D. On: 03/17/2016 01:14          INITIAL IMPRESSION / ASSESSMENT AND PLAN / ED COURSE  Pertinent labs & imaging results that were available during my care of the patient were reviewed by me and considered in my medical decision making (see chart for details).  Given hypoxia CT scan findings patient discussed with Dr. Marcille Blanco for hospital admission. 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

## 2016-03-17 NOTE — ED Notes (Signed)
Pt returned from CT °

## 2016-03-17 NOTE — ED Notes (Signed)
Patient presents to ED with c/o shortness of breath and right abdominal pain radiating to left side for a few weeks. Patient denies hx of smoking. Denies chest pain, nausea, vomiting, or diarrhea. Patient O2 saturation ranging 84% RA, patient placed on 2L nasal cannula. Patient speaking in complete sentences, alert and oriented x 4, skin warm and dry. MD at bedside.

## 2016-03-17 NOTE — ED Notes (Signed)
Patient grandson Gus Puma [phone] reports patient was told she had cancer, but states he does not know more of her medical history. States "she goes to her doctors appointments but doesn't let us back with her and she doesn't tell us anything." Patient reports will be going home but we can call him to pick up patient if she is discharged.

## 2016-03-17 NOTE — ED Notes (Signed)
Pt in with co shob and abd pain states for a few weeks, no n.v.d. Or dysuria.

## 2016-03-17 NOTE — ED Notes (Signed)
Assisted patient to the restroom.  

## 2016-03-17 NOTE — Consult Note (Signed)
[facility]  Date of admission:  03/17/2016  Inpatient day:  03/17/2016  Consulting physician:  Dr. Demetrios Loll   Reason for Consultation:  Progression of lymphoma.  Chief Complaint: Kristy Solomon is a 82 y.o. 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  HPI:  The patient presented on 06/21/2012 with abdominal and flank pain.  CT scans revealed lymphadenopathy.  Abdominal wall mass biopsy in 10/2011 revealed a CD20 positive lymphoplasmacytic lymphoma. She was treated with weekly Rituxan x 4 beginning in 12/2012.  PET scan revealed no response to treatment.  Imaging studies showed multiple breast masses.  On 02/14/2013, she underwent breast biopsy.  Pathology confirmed lymphoma.  She was observed without intervention.  The patient was initially sen by Dr. Inez Pilgrim at [facility].  She has been seen twice by Dr. Alvy Bimler in [place].  Last appointment with Dr. Alvy Bimler on 07/04/2014 revealed resolution of periumbilical pain.  She denied any weight loss.  Exam revealed no palpable adenopathy.    Labs on 07/04/2014 revealed a hematocrit of 41.8, hemoglobin 13.3, MCV 78.2, platelets 128,000, WBC 4100 with a normal differential.  Comprehensive metabolic panel revealed a BUN 30.9, creatinine 1.8, protein 9.7 (high), and albumen 3.0.  LDH was 161, uric acid was 10.9 (2.6 - 7.4).  SPEP revealed a 2.7 gm/dL monoclonal protein.  IgM was 4750 (52-322).  Kappa free light chains were 75.6, lambda free light chains 2.25 and ratio 33.6 (high).  Beta-2 microglobulin was 6.74 (high).  Iron studies included a ferritin of 27 and an iron saturation of 18 % (low).  Abdominal and pelvic CT scan on 12/13/2014 revealed progression of extensive lymphomatous involvement of the left kidney (10.3 cm) , bilateral lower lungs (4.3 x 3.6 cm, 3.5 x 2.9 cm posterior basilar segment of LLL and 4.7 cm RML), retoperitoneal and mesenteric adenopathy and extensive subcutaneous  lymphomatous implants.   She has been lost to follow-up.    She notes a 27 pound weight loss over the past 2 months.  She has no appetite.  For the past 2 months, she has had progressive abdominal pain.  She denies any fevers.  She has had some sweats.  She notes intermittent dizziness over the past year.  She notes that the masses in her SQ tissue have gradually increased in size.  She has to sit up to breathe.  She feels weak.    Chest, abdomen, and pelvic CT scan revealed progression of diffuse lymphoma with subcutaneous deposits throughout the subcutaneous fat of the chest, abdomen, and pelvis.   There were multiple nodular infiltrates throughout the lungs, likely due to lymphoma but could also represent infection or atypical infection.  There was massive retroperitoneal lymphadenopathy with diffuse retroperitoneal, mesenteric, and pelvic lymphadenopathy.   There was wall thickening and pelvicsmall bowel probably representing lymphomas involvement.  There was probable direct invasion of the left kidney.  Labs on 03/17/2016 revealed a hematocrit of 29.7, hemoglobin 9.8, MCV 68.2, platelets 79,000, and WBC 5600.  BUN was 30 and creatinine 1.38 with a CrCl of 39 ml/min.  HIV testing was negative.  She denies any blurred vision, headache, or paresthesias.  Past Medical History  Diagnosis Date  . Hypertension   . Hyperlipidemia   . Malignant lymphoma, lymphoplasmacytic ([place]) 12/27/2013  . 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 Renal insufficiency     Past Surgical History  Procedure Laterality Date  . Hemorrhoid surgery    . Abdominal hysterectomy  Family History  Problem Relation Age of Onset  . Cancer Brother     throat ca  . Cancer Brother     bone cancer    Social History:  reports that she has never smoked. She has never used smokeless tobacco. She reports that she does not drink alcohol or use illicit drugs.  She lives in [place] with her grandson and his wife.  She  is accompanied by 2 friends, her daughter [name]) and another grandson Vicente Serene).  Allergies: No Known Allergies  Medications Prior to Admission  Medication Sig Dispense Refill  . atorvastatin (LIPITOR) 20 MG tablet Take 20 mg by mouth daily.    . colchicine 0.6 MG tablet Take 1 tablet (0.6 mg total) by mouth daily. Take 2 tablets at onset of pain, then may take 1 more 1 hour later if pain continues. 3 tablet 0  . indomethacin (INDOCIN) 50 MG capsule Take 1 capsule (50 mg total) by mouth 3 (three) times daily with meals as needed. (Patient taking differently: Take 50 mg by mouth 3 (three) times daily with meals as needed for mild pain. ) 21 capsule 0  . lisinopril-hydrochlorothiazide (PRINZIDE,ZESTORETIC) 20-12.5 MG per tablet Take 1 tablet by mouth daily.    . metoprolol tartrate (LOPRESSOR) 25 MG tablet Take 25 mg by mouth 2 (two) times daily.  0  . omeprazole (PRILOSEC) 20 MG capsule Take 20 mg by mouth daily.  0  . polyethylene glycol (MIRALAX / GLYCOLAX) packet Take 17 g by mouth daily as needed for mild constipation.       Review of Systems: GENERAL:  Fatigue.  No fevers.  Some sweats.  Weight loss of 27 pounds in 2 months. PERFORMANCE STATUS (ECOG): 2 HEENT:  No visual changes, runny nose, sore throat, mouth sores or tenderness. Lungs: Shortness of breath.  Needs to sit up..  No hemoptysis. Cardiac:  No chest pain, palpitations, orthopnea, or PND. GI:  No appetite.  Abdominal discomfort.  No nausea, vomiting, diarrhea, constipation, melena or hematochezia. GU:  No urgency, frequency, dysuria, or hematuria. Musculoskeletal:  No back pain.  No joint pain.  No muscle tenderness. Extremities:  No pain or swelling. Skin:  Nodules in skin have gradually increased over time.  No rashes. Neuro:  Dizzy at times.  No headache, numbness or weakness, balance or coordination issues. Endocrine:  No diabetes, thyroid issues, hot flashes or night sweats. Psych:  Poor sleep.  No mood changes,  depression or anxiety. Pain:  Abdominal discomfort. Review of systems:  All other systems reviewed and found to be negative.  Physical Exam:  Blood pressure 160/66, pulse 75, temperature 98.6 F (37 C), temperature source Oral, resp. rate 19, height 5' (1.524 m), weight 173 lb (78.472 kg), SpO2 98 %.  GENERAL:  Well developed, well nourished, elderly woman sitting comfortably on the medical unit in no acute distress. MENTAL STATUS:  Alert and oriented to person, place and time. HEAD:  Wearing a red cap.  Brown hair.  Normocephalic, atraumatic, face symmetric, no Cushingoid features. EYES:  Brown eyes.  Pupils equal round and reactive to light and accomodation.  No conjunctivitis or scleral icterus. ENT:  Oropharynx clear without lesion.  Tongue normal. Mucous membranes moist.  RESPIRATORY:  Poor respiratory excursion.  Coarse breath sounds lower lobes.  No wheezes or rhonchi. CARDIOVASCULAR:  Regular rate and rhythm without murmur, rub or gallop. BREASTS:  Large left breast mass (previously biopsied documenting lymphoma). ABDOMEN:  Soft, slightly tender without guarding or rebound tenderness.  Active bowel sounds.  Large right sided abdominal fullness/mas.  No splenomegaly.   SKIN:  Extensive nodules in subcutaneous tissue (scalp, flank, back, paravertebral, abdominal wall, left inguinal region).  EXTREMITIES: No edema, no skin discoloration or tenderness.  No palpable cords. LYMPH NODES: No palpable cervical, supraclavicular, axillary or inguinal adenopathy  NEUROLOGICAL: Unremarkable. PSYCH:  Appropriate.   Results for orders placed or performed during the hospital encounter of 03/17/16 (from the past 48 hour(s))  Basic metabolic panel     Status: Abnormal   Collection Time: 03/17/16  1:09 AM  Result Value Ref Range   Sodium 138 135 - 145 mmol/L   Potassium 3.5 3.5 - 5.1 mmol/L   Chloride 105 101 - 111 mmol/L   CO2 21 (L) 22 - 32 mmol/L   Glucose, Bld 101 (H) 65 - 99 mg/dL   BUN 30  (H) 6 - 20 mg/dL   Creatinine, Ser 1.38 (H) 0.44 - 1.00 mg/dL   Calcium 9.7 8.9 - 10.3 mg/dL   GFR calc non Af Amer 34 (L) >60 mL/min   GFR calc Af Amer 39 (L) >60 mL/min    Comment: (NOTE) The eGFR has been calculated using the CKD EPI equation. This calculation has not been validated in all clinical situations. eGFR's persistently <60 mL/min signify possible Chronic Kidney Disease.    Anion gap 12 5 - 15  CBC     Status: Abnormal   Collection Time: 03/17/16  1:09 AM  Result Value Ref Range   WBC 5.6 3.6 - 11.0 K/uL   RBC 4.35 3.80 - 5.20 MIL/uL   Hemoglobin 9.8 (L) 12.0 - 16.0 g/dL   HCT 29.7 (L) 35.0 - 47.0 %   MCV 68.2 (L) 80.0 - 100.0 fL   MCH 22.5 (L) 26.0 - 34.0 pg   MCHC 33.0 32.0 - 36.0 g/dL   RDW 20.6 (H) 11.5 - 14.5 %   Platelets 79 (L) 150 - 440 K/uL    Comment: PLATELET COUNT CONFIRMED BY SMEAR  Troponin I     Status: None   Collection Time: 03/17/16  1:09 AM  Result Value Ref Range   Troponin I <0.03 <0.031 ng/mL    Comment:        NO INDICATION OF MYOCARDIAL INJURY.    Ct Chest W Contrast  03/17/2016  CLINICAL DATA:  Shortness of breath and right abdominal pain radiating to the left side for a few weeks. History of lymphoma. EXAM: CT CHEST, ABDOMEN, AND PELVIS WITH CONTRAST TECHNIQUE: Multidetector CT imaging of the chest, abdomen and pelvis was performed following the standard protocol during bolus administration of intravenous contrast. CONTRAST:  30m ISOVUE-300 IOPAMIDOL (ISOVUE-300) INJECTION 61% COMPARISON:  CT abdomen and pelvis 12/13/2014 FINDINGS: CT CHEST FINDINGS Mediastinum/Lymph Nodes: Normal heart size. Coronary artery calcifications. Normal caliber thoracic aorta. Esophagus is decompressed. Enlarged and heterogeneous left thyroid nodule with retrosternal extension. Lungs/Pleura: Evaluation of lungs is limited due to motion artifact. There are focal areas of masslike consolidation in the lung bases with patchy nodular infiltrative lesions throughout  both lungs. There is significant progression since previous study. Changes could be due 2 lymphoma, metastatic disease, or septic emboli with multiple nodular areas of infection. Fungal infection could also have this appearance. Musculoskeletal: There soft tissue nodules demonstrated throughout the subcutaneous fat of the chest wall with axillary and supraclavicular lymphadenopathy. This likely represents lymphoproliferative disorder with diffuse soft tissue lymphoma. Similar findings are present on the previous study but there is likely progression. CT ABDOMEN PELVIS  FINDINGS Hepatobiliary: No masses or other significant abnormality. Pancreas: No mass, inflammatory changes, or other significant abnormality. Spleen: Within normal limits in size and appearance. Adrenals/Urinary Tract: Large stones demonstrated in both kidneys without hydronephrosis. Stomach/Bowel: Small esophageal hiatal hernia. Stomach is decompressed. Small bowel are decompressed. Suggestion of small bowel wall thickening in the pelvis likely to represent lymphomatous involvement. No evidence of obstruction. Vascular/Lymphatic: There are markedly enlarged lymph nodes throughout the retroperitoneum, mesenteric, pelvis, and throughout the subcutaneous fat. Massive retroperitoneal lymphadenopathy in the left periaortic space causes displacement and deviation of the left kidney with probable direct invasion of the left kidney. This mass measures up to about 13.8 by 10.7 cm. There is progression since previous study. Diffuse calcification of abdominal aorta without aneurysm. Reproductive: Uterus is surgically absent. Other: None. Musculoskeletal: No destructive bone lesions. Degenerative changes in the spine. IMPRESSION: Progression of diffuse lymphoma with subcutaneous deposits demonstrated throughout the subcutaneous fat of the chest, abdomen, and pelvis. Multiple nodular infiltrates throughout the lungs, likely due to lymphoma but could also  represent infection or atypical infection. Massive retroperitoneal lymphadenopathy with diffuse retroperitoneal, mesenteric, and pelvic lymphadenopathy throughout. Wall thickening and pelvic small bowel probably represents lymphomas involvement. Probable direct invasion of the left kidney. Staghorn calculi in both kidneys. Electronically Signed   By: Lucienne Capers M.D.   On: 03/17/2016 05:05   Ct Abdomen Pelvis W Contrast  03/17/2016  CLINICAL DATA:  Shortness of breath and right abdominal pain radiating to the left side for a few weeks. History of lymphoma. EXAM: CT CHEST, ABDOMEN, AND PELVIS WITH CONTRAST TECHNIQUE: Multidetector CT imaging of the chest, abdomen and pelvis was performed following the standard protocol during bolus administration of intravenous contrast. CONTRAST:  84m ISOVUE-300 IOPAMIDOL (ISOVUE-300) INJECTION 61% COMPARISON:  CT abdomen and pelvis 12/13/2014 FINDINGS: CT CHEST FINDINGS Mediastinum/Lymph Nodes: Normal heart size. Coronary artery calcifications. Normal caliber thoracic aorta. Esophagus is decompressed. Enlarged and heterogeneous left thyroid nodule with retrosternal extension. Lungs/Pleura: Evaluation of lungs is limited due to motion artifact. There are focal areas of masslike consolidation in the lung bases with patchy nodular infiltrative lesions throughout both lungs. There is significant progression since previous study. Changes could be due 2 lymphoma, metastatic disease, or septic emboli with multiple nodular areas of infection. Fungal infection could also have this appearance. Musculoskeletal: There soft tissue nodules demonstrated throughout the subcutaneous fat of the chest wall with axillary and supraclavicular lymphadenopathy. This likely represents lymphoproliferative disorder with diffuse soft tissue lymphoma. Similar findings are present on the previous study but there is likely progression. CT ABDOMEN PELVIS FINDINGS Hepatobiliary: No masses or other  significant abnormality. Pancreas: No mass, inflammatory changes, or other significant abnormality. Spleen: Within normal limits in size and appearance. Adrenals/Urinary Tract: Large stones demonstrated in both kidneys without hydronephrosis. Stomach/Bowel: Small esophageal hiatal hernia. Stomach is decompressed. Small bowel are decompressed. Suggestion of small bowel wall thickening in the pelvis likely to represent lymphomatous involvement. No evidence of obstruction. Vascular/Lymphatic: There are markedly enlarged lymph nodes throughout the retroperitoneum, mesenteric, pelvis, and throughout the subcutaneous fat. Massive retroperitoneal lymphadenopathy in the left periaortic space causes displacement and deviation of the left kidney with probable direct invasion of the left kidney. This mass measures up to about 13.8 by 10.7 cm. There is progression since previous study. Diffuse calcification of abdominal aorta without aneurysm. Reproductive: Uterus is surgically absent. Other: None. Musculoskeletal: No destructive bone lesions. Degenerative changes in the spine. IMPRESSION: Progression of diffuse lymphoma with subcutaneous deposits demonstrated throughout the subcutaneous  fat of the chest, abdomen, and pelvis. Multiple nodular infiltrates throughout the lungs, likely due to lymphoma but could also represent infection or atypical infection. Massive retroperitoneal lymphadenopathy with diffuse retroperitoneal, mesenteric, and pelvic lymphadenopathy throughout. Wall thickening and pelvic small bowel probably represents lymphomas involvement. Probable direct invasion of the left kidney. Staghorn calculi in both kidneys. Electronically Signed   By: Lucienne Capers M.D.   On: 03/17/2016 05:05   Dg Chest Port 1 View  03/17/2016  CLINICAL DATA:  Shortness of breath and right-sided abdominal pain radiating to the left for a few weeks. Decreased oxygen saturation. EXAM: PORTABLE CHEST 1 VIEW COMPARISON:  None.  FINDINGS: Cardiac enlargement. Pulmonary vascularity appears normal for technique. Rounded opacity in the right lung base may represent rounded pneumonia or mass lesion. CT suggested for further evaluation. Probable infiltration or atelectasis in the left lung base behind the heart as well. No blunting of costophrenic angles. No pneumothorax. Calcification of the aorta. Degenerative changes in the spine and shoulders. IMPRESSION: Cardiac enlargement. Mass versus consolidation in the right lower lung. Probable consolidation in the left lung base. CT suggested for further evaluation. Electronically Signed   By: Lucienne Capers M.D.   On: 03/17/2016 01:14    Assessment:  The patient is a 82 y.o. woman with lymphoplasmacytic lymphoma/Waldenstrom's macroglobulinemia.  She has massive subcutaneous deposits, pulmonary nodules, involvement of left kidney, and bowel.  She likely has bone marrow involvement secondary to her low counts. Given the massive increase in disease burden over the past 15 months, her lymphoma may have transformed.  Disease was unresponsive to Rituxan in 2014.  Previously, she did not wish to purse chemotherapy because of lack of symptoms.  Her abdominal discomfort and respiratory symptoms are a direct result of her progressive disease.  She has lost 27 pounds in the past 2 months.  She denies any blurred vision, headache, or paresthesias.  Plan:   1.  Review results of imaging with patient and family.  Discuss massive adenopathy with progression of disease in multiple subcutaneous nodules, lung nodules, left kidney invasion, retroperitoneal adenopathy, and bowel involvement.  Discuss unresponsiveness to Rituxan.  Discuss supportive care or treatment with chemotherapy (high risk).  Discuss chemotherapy with bendamustine and Rituxan (or Velcade, Rituxan, and Decadron) unless disease has transformed to a high grade lymphoma.  Discuss obtaining an ultrasound guided biopsy of the right flank  nodule (marked increase size since 12/2014).  Discuss bone marrow aspirate and biopsy (likley involvement). 2.  Discuss treatment in [place] or [place] (patient's most recent oncologist is Dr. Alvy Bimler in [place]).  Discuss side effects associated with treatment.  Discuss risk of tumor lysis syndrome and potential renal failure.  Would obtain renal consult regarding potential need for dialysis. 3.  Discuss code status issues.  Given massive disease burden and patient's age, discuss DNR/DNI code status.  She will discuss further with her family. 4.  Labs: hepatitis testing in anticipation of Rituxan, uric acid, LDH, SPEP, immunoglobulins, beta2-microglobulin, serum viscosity, ferritin, iron studies, B12, folate.  Thank you for allowing me to participate in Kristy Solomon 's care.  I will follow herclosely with you while hospitalized and after discharge in the outpatient department.   Lequita Asal, MD  03/17/2016, 11:58 PM

## 2016-03-17 NOTE — ED Notes (Signed)
Pt went to CT

## 2016-03-17 NOTE — Progress Notes (Signed)
Renal dose adjust abx:  Pt currently ordered ceftriaxone and azithromycin - no renal dose adjustment needed at this time.

## 2016-03-17 NOTE — H&P (Addendum)
[facility] NAME: Kristy Solomon    MR#:  [id number]  DATE OF BIRTH:  1929-09-07  DATE OF ADMISSION:  03/17/2016  PRIMARY CARE PHYSICIAN: Volanda Napoleon, MD   REQUESTING/REFERRING PHYSICIAN: Gregor Hams, MD  CHIEF COMPLAINT:   Chief Complaint  Patient presents with  . Shortness of Breath   SOB for a few weeks. HISTORY OF PRESENT ILLNESS:  Kristy Solomon  is a 82 y.o. female with a known history of HTN, HLP, anemia, CKD and lymphoma. She came to ED due to progressive dyspnea and generalized abdominal pain for a few weeks. Abdominal pain is diffuse and mild without radiation. She lost weight recently. She denies any other symptoms. But she had SAT 84% in room air in ED.   PAST MEDICAL HISTORY:   Past Medical History  Diagnosis Date  . Hypertension   . Hyperlipidemia   . 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 Renal insufficiency     PAST SURGICAL HISTORY:   Past Surgical History  Procedure Laterality Date  . Hemorrhoid surgery    . Abdominal hysterectomy      SOCIAL HISTORY:   Social History  Substance Use Topics  . Smoking status: Never Smoker   . Smokeless tobacco: Never Used  . Alcohol Use: No    FAMILY HISTORY:   Family History  Problem Relation Age of Onset  . Cancer Brother     throat ca  . Cancer Brother     bone cancer    DRUG ALLERGIES:  No Known Allergies  REVIEW OF SYSTEMS:  CONSTITUTIONAL: No fever, has generalized weakness.  EYES: No blurred or double vision.  EARS, NOSE, AND THROAT: No tinnitus or ear pain.  RESPIRATORY: No cough, shortness of breath, wheezing or hemoptysis.  CARDIOVASCULAR: No chest pain, orthopnea, edema.  GASTROINTESTINAL: No nausea, vomiting, diarrhea but has abdominal pain. No melena or bloody stool. GENITOURINARY: No dysuria, hematuria.  ENDOCRINE: No polyuria, nocturia,   HEMATOLOGY: No anemia, easy bruising or bleeding SKIN: No rash or lesion. MUSCULOSKELETAL: No joint pain or arthritis.   NEUROLOGIC: No tingling, numbness, weakness.  PSYCHIATRY: No anxiety or depression.   MEDICATIONS AT HOME:   Prior to Admission medications   Medication Sig Start Date End Date Taking? Authorizing Provider  atorvastatin (LIPITOR) 20 MG tablet Take 20 mg by mouth daily. 12/24/13  Yes Historical Provider, MD  AZOR 5-40 MG per tablet Take 1 tablet by mouth daily. 10/29/13  Yes Historical Provider, MD  colchicine 0.6 MG tablet Take 1 tablet (0.6 mg total) by mouth daily. Take 2 tablets at onset of pain, then may take 1 more 1 hour later if pain continues. 01/24/16  Yes Jami L Hagler, PA-C  indomethacin (INDOCIN) 50 MG capsule Take 1 capsule (50 mg total) by mouth 3 (three) times daily with meals as needed. Patient taking differently: Take 50 mg by mouth 3 (three) times daily with meals as needed for mild pain.  01/24/16  Yes Jami L Hagler, PA-C  lisinopril-hydrochlorothiazide (PRINZIDE,ZESTORETIC) 20-12.5 MG per tablet Take 1 tablet by mouth daily. 12/24/13  Yes Historical Provider, MD  metoprolol tartrate (LOPRESSOR) 25 MG tablet Take 25 mg by mouth 2 (two) times daily. 03/05/16  Yes Historical Provider, MD  omeprazole (PRILOSEC) 20 MG capsule Take 20 mg by mouth daily. 03/02/16  Yes Historical Provider, MD  polyethylene glycol (MIRALAX / GLYCOLAX) packet Take 17 g by mouth  daily as needed for mild constipation.    Yes Historical Provider, MD      VITAL SIGNS:  Blood pressure 167/62, pulse 64, temperature 97.9 F (36.6 C), temperature source Oral, resp. rate 18, height 5' (1.524 m), weight 173 lb (78.472 kg), SpO2 98 %.  PHYSICAL EXAMINATION:  GENERAL:  82 y.o.-year-old patient lying in the bed with no acute distress.  EYES: Pupils equal, round, reactive to light and accommodation. No scleral icterus. Extraocular muscles intact.  HEENT: Head atraumatic, normocephalic.  Oropharynx and nasopharynx clear. Moist oral mucosa. NECK:  Supple, no jugular venous distention. No thyroid enlargement, no tenderness.  LUNGS: Normal breath sounds bilaterally, no wheezing, rales,rhonchi or crepitation. No use of accessory muscles of respiration.  CARDIOVASCULAR: S1, S2 normal. No murmurs, rubs, or gallops.  ABDOMEN: Soft, nontender, nondistended. Bowel sounds present. No organomegaly or mass.  EXTREMITIES: No pedal edema, cyanosis, or clubbing.  NEUROLOGIC: Cranial nerves II through XII are intact. Muscle strength 5/5 in all extremities. Sensation intact. Gait not checked.  PSYCHIATRIC: The patient is alert and oriented x 3.  SKIN: No obvious rash, lesion, or ulcer.   LABORATORY PANEL:   CBC  Recent Labs Lab 03/17/16 0109  WBC 5.6  HGB 9.8*  HCT 29.7*  PLT 79*   ------------------------------------------------------------------------------------------------------------------  Chemistries   Recent Labs Lab 03/17/16 0109  NA 138  K 3.5  CL 105  CO2 21*  GLUCOSE 101*  BUN 30*  CREATININE 1.38*  CALCIUM 9.7   ------------------------------------------------------------------------------------------------------------------  Cardiac Enzymes  Recent Labs Lab 03/17/16 0109  TROPONINI <0.03   ------------------------------------------------------------------------------------------------------------------  RADIOLOGY:  Ct Chest W Contrast  03/17/2016  CLINICAL DATA:  Shortness of breath and right abdominal pain radiating to the left side for a few weeks. History of lymphoma. EXAM: CT CHEST, ABDOMEN, AND PELVIS WITH CONTRAST TECHNIQUE: Multidetector CT imaging of the chest, abdomen and pelvis was performed following the standard protocol during bolus administration of intravenous contrast. CONTRAST:  34mL ISOVUE-300 IOPAMIDOL (ISOVUE-300) INJECTION 61% COMPARISON:  CT abdomen and pelvis 12/13/2014 FINDINGS: CT CHEST FINDINGS Mediastinum/Lymph Nodes: Normal  heart size. Coronary artery calcifications. Normal caliber thoracic aorta. Esophagus is decompressed. Enlarged and heterogeneous left thyroid nodule with retrosternal extension. Lungs/Pleura: Evaluation of lungs is limited due to motion artifact. There are focal areas of masslike consolidation in the lung bases with patchy nodular infiltrative lesions throughout both lungs. There is significant progression since previous study. Changes could be due 2 lymphoma, metastatic disease, or septic emboli with multiple nodular areas of infection. Fungal infection could also have this appearance. Musculoskeletal: There soft tissue nodules demonstrated throughout the subcutaneous fat of the chest wall with axillary and supraclavicular lymphadenopathy. This likely represents lymphoproliferative disorder with diffuse soft tissue lymphoma. Similar findings are present on the previous study but there is likely progression. CT ABDOMEN PELVIS FINDINGS Hepatobiliary: No masses or other significant abnormality. Pancreas: No mass, inflammatory changes, or other significant abnormality. Spleen: Within normal limits in size and appearance. Adrenals/Urinary Tract: Large stones demonstrated in both kidneys without hydronephrosis. Stomach/Bowel: Small esophageal hiatal hernia. Stomach is decompressed. Small bowel are decompressed. Suggestion of small bowel wall thickening in the pelvis likely to represent lymphomatous involvement. No evidence of obstruction. Vascular/Lymphatic: There are markedly enlarged lymph nodes throughout the retroperitoneum, mesenteric, pelvis, and throughout the subcutaneous fat. Massive retroperitoneal lymphadenopathy in the left periaortic space causes displacement and deviation of the left kidney with probable direct invasion of the left kidney. This mass measures up to about 13.8 by 10.7 cm. There  is progression since previous study. Diffuse calcification of abdominal aorta without aneurysm. Reproductive: Uterus  is surgically absent. Other: None. Musculoskeletal: No destructive bone lesions. Degenerative changes in the spine. IMPRESSION: Progression of diffuse lymphoma with subcutaneous deposits demonstrated throughout the subcutaneous fat of the chest, abdomen, and pelvis. Multiple nodular infiltrates throughout the lungs, likely due to lymphoma but could also represent infection or atypical infection. Massive retroperitoneal lymphadenopathy with diffuse retroperitoneal, mesenteric, and pelvic lymphadenopathy throughout. Wall thickening and pelvic small bowel probably represents lymphomas involvement. Probable direct invasion of the left kidney. Staghorn calculi in both kidneys. Electronically Signed   By: Lucienne Capers M.D.   On: 03/17/2016 05:05   Ct Abdomen Pelvis W Contrast  03/17/2016  CLINICAL DATA:  Shortness of breath and right abdominal pain radiating to the left side for a few weeks. History of lymphoma. EXAM: CT CHEST, ABDOMEN, AND PELVIS WITH CONTRAST TECHNIQUE: Multidetector CT imaging of the chest, abdomen and pelvis was performed following the standard protocol during bolus administration of intravenous contrast. CONTRAST:  107mL ISOVUE-300 IOPAMIDOL (ISOVUE-300) INJECTION 61% COMPARISON:  CT abdomen and pelvis 12/13/2014 FINDINGS: CT CHEST FINDINGS Mediastinum/Lymph Nodes: Normal heart size. Coronary artery calcifications. Normal caliber thoracic aorta. Esophagus is decompressed. Enlarged and heterogeneous left thyroid nodule with retrosternal extension. Lungs/Pleura: Evaluation of lungs is limited due to motion artifact. There are focal areas of masslike consolidation in the lung bases with patchy nodular infiltrative lesions throughout both lungs. There is significant progression since previous study. Changes could be due 2 lymphoma, metastatic disease, or septic emboli with multiple nodular areas of infection. Fungal infection could also have this appearance. Musculoskeletal: There soft tissue  nodules demonstrated throughout the subcutaneous fat of the chest wall with axillary and supraclavicular lymphadenopathy. This likely represents lymphoproliferative disorder with diffuse soft tissue lymphoma. Similar findings are present on the previous study but there is likely progression. CT ABDOMEN PELVIS FINDINGS Hepatobiliary: No masses or other significant abnormality. Pancreas: No mass, inflammatory changes, or other significant abnormality. Spleen: Within normal limits in size and appearance. Adrenals/Urinary Tract: Large stones demonstrated in both kidneys without hydronephrosis. Stomach/Bowel: Small esophageal hiatal hernia. Stomach is decompressed. Small bowel are decompressed. Suggestion of small bowel wall thickening in the pelvis likely to represent lymphomatous involvement. No evidence of obstruction. Vascular/Lymphatic: There are markedly enlarged lymph nodes throughout the retroperitoneum, mesenteric, pelvis, and throughout the subcutaneous fat. Massive retroperitoneal lymphadenopathy in the left periaortic space causes displacement and deviation of the left kidney with probable direct invasion of the left kidney. This mass measures up to about 13.8 by 10.7 cm. There is progression since previous study. Diffuse calcification of abdominal aorta without aneurysm. Reproductive: Uterus is surgically absent. Other: None. Musculoskeletal: No destructive bone lesions. Degenerative changes in the spine. IMPRESSION: Progression of diffuse lymphoma with subcutaneous deposits demonstrated throughout the subcutaneous fat of the chest, abdomen, and pelvis. Multiple nodular infiltrates throughout the lungs, likely due to lymphoma but could also represent infection or atypical infection. Massive retroperitoneal lymphadenopathy with diffuse retroperitoneal, mesenteric, and pelvic lymphadenopathy throughout. Wall thickening and pelvic small bowel probably represents lymphomas involvement. Probable direct invasion  of the left kidney. Staghorn calculi in both kidneys. Electronically Signed   By: Lucienne Capers M.D.   On: 03/17/2016 05:05   Dg Chest Port 1 View  03/17/2016  CLINICAL DATA:  Shortness of breath and right-sided abdominal pain radiating to the left for a few weeks. Decreased oxygen saturation. EXAM: PORTABLE CHEST 1 VIEW COMPARISON:  None. FINDINGS: Cardiac enlargement. Pulmonary vascularity  appears normal for technique. Rounded opacity in the right lung base may represent rounded pneumonia or mass lesion. CT suggested for further evaluation. Probable infiltration or atelectasis in the left lung base behind the heart as well. No blunting of costophrenic angles. No pneumothorax. Calcification of the aorta. Degenerative changes in the spine and shoulders. IMPRESSION: Cardiac enlargement. Mass versus consolidation in the right lower lung. Probable consolidation in the left lung base. CT suggested for further evaluation. Electronically Signed   By: Lucienne Capers M.D.   On: 03/17/2016 01:14    EKG:   Orders placed or performed during the hospital encounter of 03/17/16  . ED EKG  . ED EKG  . EKG 12-Lead  . EKG 12-Lead    IMPRESSION AND PLAN:   Pneumonia (CAP) Start zithromax and rocephin, f/u cultures and CBC. Robitussin prn, duoneb prn, O2 [place] 2L.  Progression of lymphoma Oncology consult.  Hypoxia. Due to above. Continue oxygen by nasal cannula and DuoNeb when necessary.  HTN. Continue lopressor, norvasc, lisinopril-HCTZ.  CKD stage 3. Stable.  Thrombocytopenia. F/u CBC.   All the records are reviewed and case discussed with ED provider. Management plans discussed with the patient, her daughter, grandson and granddaughter, and they are in agreement.  CODE STATUS: full code.  TOTAL TIME TAKING CARE OF THIS PATIENT: 60 minutes.    Demetrios Loll M.D on 03/17/2016 at 8:01 AM  Between 7am to 6pm - Pager - [phone]  After 6pm go to [email or web address] - password [email or web address] [facility]  Office  [phone]  CC: Primary care physician; Volanda Napoleon, MD

## 2016-03-18 ENCOUNTER — Inpatient Hospital Stay: Payer: Medicare HMO

## 2016-03-18 ENCOUNTER — Other Ambulatory Visit: Payer: Self-pay | Admitting: *Deleted

## 2016-03-18 DIAGNOSIS — Z9221 Personal history of antineoplastic chemotherapy: Secondary | ICD-10-CM

## 2016-03-18 DIAGNOSIS — D509 Iron deficiency anemia, unspecified: Secondary | ICD-10-CM | POA: Insufficient documentation

## 2016-03-18 DIAGNOSIS — R634 Abnormal weight loss: Secondary | ICD-10-CM

## 2016-03-18 DIAGNOSIS — Z66 Do not resuscitate: Secondary | ICD-10-CM

## 2016-03-18 DIAGNOSIS — E790 Hyperuricemia without signs of inflammatory arthritis and tophaceous disease: Secondary | ICD-10-CM

## 2016-03-18 DIAGNOSIS — C8300 Small cell B-cell lymphoma, unspecified site: Secondary | ICD-10-CM

## 2016-03-18 DIAGNOSIS — E538 Deficiency of other specified B group vitamins: Secondary | ICD-10-CM | POA: Insufficient documentation

## 2016-03-18 DIAGNOSIS — R5383 Other fatigue: Secondary | ICD-10-CM

## 2016-03-18 LAB — HIV ANTIBODY (ROUTINE TESTING W REFLEX): HIV SCREEN 4TH GENERATION: NONREACTIVE

## 2016-03-18 LAB — PROTIME-INR
INR: 1.34
Prothrombin Time: 16.7 seconds — ABNORMAL HIGH (ref 11.4–15.0)

## 2016-03-18 LAB — CBC
HEMATOCRIT: 29.1 % — AB (ref 35.0–47.0)
HEMOGLOBIN: 9.5 g/dL — AB (ref 12.0–16.0)
MCH: 22.8 pg — AB (ref 26.0–34.0)
MCHC: 32.8 g/dL (ref 32.0–36.0)
MCV: 69.6 fL — ABNORMAL LOW (ref 80.0–100.0)
Platelets: 77 10*3/uL — ABNORMAL LOW (ref 150–440)
RBC: 4.18 MIL/uL (ref 3.80–5.20)
RDW: 20.4 % — AB (ref 11.5–14.5)
WBC: 5.3 10*3/uL (ref 3.6–11.0)

## 2016-03-18 LAB — LACTATE DEHYDROGENASE: LDH: 123 U/L (ref 98–192)

## 2016-03-18 LAB — APTT: aPTT: 48 seconds — ABNORMAL HIGH (ref 24–36)

## 2016-03-18 LAB — IRON AND TIBC
Iron: 23 ug/dL — ABNORMAL LOW (ref 28–170)
Saturation Ratios: 11 % (ref 10.4–31.8)
TIBC: 207 ug/dL — ABNORMAL LOW (ref 250–450)
UIBC: 184 ug/dL

## 2016-03-18 LAB — FERRITIN: Ferritin: 27 ng/mL (ref 11–307)

## 2016-03-18 LAB — VITAMIN B12: Vitamin B-12: 147 pg/mL — ABNORMAL LOW (ref 180–914)

## 2016-03-18 LAB — FOLATE: Folate: 5.9 ng/mL — ABNORMAL LOW (ref >5.9)

## 2016-03-18 MED ORDER — HEPARIN SOD (PORK) LOCK FLUSH 100 UNIT/ML IV SOLN
INTRAVENOUS | Status: AC
  Filled 2016-03-18: qty 5

## 2016-03-18 MED ORDER — MIDAZOLAM HCL 5 MG/5ML IJ SOLN
INTRAMUSCULAR | Status: AC
  Filled 2016-03-18: qty 5

## 2016-03-18 MED ORDER — FENTANYL CITRATE (PF) 100 MCG/2ML IJ SOLN
INTRAMUSCULAR | Status: AC
  Filled 2016-03-18: qty 4

## 2016-03-18 MED ORDER — IPRATROPIUM-ALBUTEROL 0.5-2.5 (3) MG/3ML IN SOLN: 3.0000 mL | Freq: Four times a day (QID) | RESPIRATORY_TRACT | Status: DC | PRN

## 2016-03-18 MED ORDER — MIDAZOLAM HCL 5 MG/5ML IJ SOLN
INTRAMUSCULAR | Status: AC | PRN
  Administered 2016-03-18: 1 mg via INTRAVENOUS
  Administered 2016-03-18: 2 mg via INTRAVENOUS

## 2016-03-18 MED ORDER — ENOXAPARIN SODIUM 30 MG/0.3ML ~~LOC~~ SOLN
30.0000 mg | SUBCUTANEOUS | Status: DC
  Administered 2016-03-19 – 2016-03-20 (×2): 30 mg via SUBCUTANEOUS
  Filled 2016-03-18 (×2): qty 0.3

## 2016-03-18 MED ORDER — SODIUM CHLORIDE 0.9 % IV SOLN
INTRAVENOUS | Status: DC
  Administered 2016-03-18: 08:00:00 via INTRAVENOUS

## 2016-03-18 MED ORDER — AMLODIPINE BESYLATE 10 MG PO TABS
10.0000 mg | ORAL_TABLET | Freq: Every day | ORAL | Status: DC
  Administered 2016-03-19 – 2016-03-20 (×2): 10 mg via ORAL
  Filled 2016-03-18 (×2): qty 1

## 2016-03-18 MED ORDER — FENTANYL CITRATE (PF) 100 MCG/2ML IJ SOLN
INTRAMUSCULAR | Status: AC | PRN
  Administered 2016-03-18: 25 ug via INTRAVENOUS
  Administered 2016-03-18: 50 ug via INTRAVENOUS

## 2016-03-18 NOTE — Procedures (Signed)
Technically successful CT guided bone marrow aspiration and biopsy of right iliac crest.  Technically successful US guided biopsy of indeterminate mass within the subcutaneous tissues about the inferior aspect of the right flank.  EBL: Minimal  No immediate complications.    SignedSandi Mariscal [phone] 03/18/2016, 12:14 PM

## 2016-03-18 NOTE — Progress Notes (Signed)
Patient being transferred to 1C. Report called to [facility]. Called Ebony Hail in specials and she will take patient directly to room 103.
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
## 2016-03-18 NOTE — Consult Note (Signed)
Chief Complaint: Concern for worsening lymphoma  Referring Physician(s): Cocoran   Patient Status: Inpatient  History of Present Illness: Kristy Solomon is a 82 y.o. female with past medical history significant for hypertension, hyperlipidemia and renal insufficiency now with CT findings worrisome for progression of lymphoma. Request made for ultrasound-guided biopsy of dominant soft tissue mass about the right flank as well as CT-guided bone marrow biopsy and aspiration for tissue diagnostic purposes.  The patient is accompanied by her daughter though serves as her own historian.  Past Medical History  Diagnosis Date  . Hypertension   . Hyperlipidemia   . 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 Renal insufficiency     Past Surgical History  Procedure Laterality Date  . Hemorrhoid surgery    . Abdominal hysterectomy      Allergies: Review of patient's allergies indicates no known allergies.  Medications: Prior to Admission medications   Medication Sig Start Date End Date Taking? Authorizing Provider  atorvastatin (LIPITOR) 20 MG tablet Take 20 mg by mouth daily. 12/24/13  Yes Historical Provider, MD  colchicine 0.6 MG tablet Take 1 tablet (0.6 mg total) by mouth daily. Take 2 tablets at onset of pain, then may take 1 more 1 hour later if pain continues. 01/24/16  Yes Jami L Hagler, PA-C  indomethacin (INDOCIN) 50 MG capsule Take 1 capsule (50 mg total) by mouth 3 (three) times daily with meals as needed. Patient taking differently: Take 50 mg by mouth 3 (three) times daily with meals as needed for mild pain.  01/24/16  Yes Jami L Hagler, PA-C  lisinopril-hydrochlorothiazide (PRINZIDE,ZESTORETIC) 20-12.5 MG per tablet Take 1 tablet by mouth daily. 12/24/13  Yes Historical Provider, MD  metoprolol tartrate (LOPRESSOR) 25 MG tablet Take 25 mg by mouth 2 (two) times daily. 03/05/16  Yes Historical Provider, MD    omeprazole (PRILOSEC) 20 MG capsule Take 20 mg by mouth daily. 03/02/16  Yes Historical Provider, MD  polyethylene glycol (MIRALAX / GLYCOLAX) packet Take 17 g by mouth daily as needed for mild constipation.    Yes Historical Provider, MD     Family History  Problem Relation Age of Onset  . Cancer Brother     throat ca  . Cancer Brother     bone cancer    Social History   Social History  . Marital Status: Widowed    Spouse Name: N/A  . Number of Children: N/A  . Years of Education: N/A   Social History Main Topics  . Smoking status: Never Smoker   . Smokeless tobacco: Never Used  . Alcohol Use: No  . Drug Use: No  . Sexual Activity: Not Asked   Other Topics Concern  . None   Social History Narrative    ECOG Status: 1 - Symptomatic but completely ambulatory  Review of Systems: A 12 point ROS discussed and pertinent positives are indicated in the HPI above.  All other systems are negative.  Review of Systems  As previously reported  Vital Signs: BP 146/63 mmHg  Pulse 69  Temp(Src) 98.2 F (36.8 C) (Oral)  Resp 18  Ht 5' (1.524 m)  Wt 173 lb (78.472 kg)  BMI 33.79 kg/m2  SpO2 98%  Physical Exam  As previously reported.  Mallampati Score:     Imaging: Ct Chest W Contrast  03/17/2016  CLINICAL DATA:  Shortness of breath and right abdominal pain radiating to the left side for a  few weeks. History of lymphoma. EXAM: CT CHEST, ABDOMEN, AND PELVIS WITH CONTRAST TECHNIQUE: Multidetector CT imaging of the chest, abdomen and pelvis was performed following the standard protocol during bolus administration of intravenous contrast. CONTRAST:  31m ISOVUE-300 IOPAMIDOL (ISOVUE-300) INJECTION 61% COMPARISON:  CT abdomen and pelvis 12/13/2014 FINDINGS: CT CHEST FINDINGS Mediastinum/Lymph Nodes: Normal heart size. Coronary artery calcifications. Normal caliber thoracic aorta. Esophagus is decompressed. Enlarged and heterogeneous left thyroid nodule with retrosternal  extension. Lungs/Pleura: Evaluation of lungs is limited due to motion artifact. There are focal areas of masslike consolidation in the lung bases with patchy nodular infiltrative lesions throughout both lungs. There is significant progression since previous study. Changes could be due 2 lymphoma, metastatic disease, or septic emboli with multiple nodular areas of infection. Fungal infection could also have this appearance. Musculoskeletal: There soft tissue nodules demonstrated throughout the subcutaneous fat of the chest wall with axillary and supraclavicular lymphadenopathy. This likely represents lymphoproliferative disorder with diffuse soft tissue lymphoma. Similar findings are present on the previous study but there is likely progression. CT ABDOMEN PELVIS FINDINGS Hepatobiliary: No masses or other significant abnormality. Pancreas: No mass, inflammatory changes, or other significant abnormality. Spleen: Within normal limits in size and appearance. Adrenals/Urinary Tract: Large stones demonstrated in both kidneys without hydronephrosis. Stomach/Bowel: Small esophageal hiatal hernia. Stomach is decompressed. Small bowel are decompressed. Suggestion of small bowel wall thickening in the pelvis likely to represent lymphomatous involvement. No evidence of obstruction. Vascular/Lymphatic: There are markedly enlarged lymph nodes throughout the retroperitoneum, mesenteric, pelvis, and throughout the subcutaneous fat. Massive retroperitoneal lymphadenopathy in the left periaortic space causes displacement and deviation of the left kidney with probable direct invasion of the left kidney. This mass measures up to about 13.8 by 10.7 cm. There is progression since previous study. Diffuse calcification of abdominal aorta without aneurysm. Reproductive: Uterus is surgically absent. Other: None. Musculoskeletal: No destructive bone lesions. Degenerative changes in the spine. IMPRESSION: Progression of diffuse lymphoma with  subcutaneous deposits demonstrated throughout the subcutaneous fat of the chest, abdomen, and pelvis. Multiple nodular infiltrates throughout the lungs, likely due to lymphoma but could also represent infection or atypical infection. Massive retroperitoneal lymphadenopathy with diffuse retroperitoneal, mesenteric, and pelvic lymphadenopathy throughout. Wall thickening and pelvic small bowel probably represents lymphomas involvement. Probable direct invasion of the left kidney. Staghorn calculi in both kidneys. Electronically Signed   By: WLucienne CapersM.D.   On: 03/17/2016 05:05   Ct Abdomen Pelvis W Contrast  03/17/2016  CLINICAL DATA:  Shortness of breath and right abdominal pain radiating to the left side for a few weeks. History of lymphoma. EXAM: CT CHEST, ABDOMEN, AND PELVIS WITH CONTRAST TECHNIQUE: Multidetector CT imaging of the chest, abdomen and pelvis was performed following the standard protocol during bolus administration of intravenous contrast. CONTRAST:  7105mISOVUE-300 IOPAMIDOL (ISOVUE-300) INJECTION 61% COMPARISON:  CT abdomen and pelvis 12/13/2014 FINDINGS: CT CHEST FINDINGS Mediastinum/Lymph Nodes: Normal heart size. Coronary artery calcifications. Normal caliber thoracic aorta. Esophagus is decompressed. Enlarged and heterogeneous left thyroid nodule with retrosternal extension. Lungs/Pleura: Evaluation of lungs is limited due to motion artifact. There are focal areas of masslike consolidation in the lung bases with patchy nodular infiltrative lesions throughout both lungs. There is significant progression since previous study. Changes could be due 2 lymphoma, metastatic disease, or septic emboli with multiple nodular areas of infection. Fungal infection could also have this appearance. Musculoskeletal: There soft tissue nodules demonstrated throughout the subcutaneous fat of the chest wall with axillary and supraclavicular lymphadenopathy. This  likely represents lymphoproliferative  disorder with diffuse soft tissue lymphoma. Similar findings are present on the previous study but there is likely progression. CT ABDOMEN PELVIS FINDINGS Hepatobiliary: No masses or other significant abnormality. Pancreas: No mass, inflammatory changes, or other significant abnormality. Spleen: Within normal limits in size and appearance. Adrenals/Urinary Tract: Large stones demonstrated in both kidneys without hydronephrosis. Stomach/Bowel: Small esophageal hiatal hernia. Stomach is decompressed. Small bowel are decompressed. Suggestion of small bowel wall thickening in the pelvis likely to represent lymphomatous involvement. No evidence of obstruction. Vascular/Lymphatic: There are markedly enlarged lymph nodes throughout the retroperitoneum, mesenteric, pelvis, and throughout the subcutaneous fat. Massive retroperitoneal lymphadenopathy in the left periaortic space causes displacement and deviation of the left kidney with probable direct invasion of the left kidney. This mass measures up to about 13.8 by 10.7 cm. There is progression since previous study. Diffuse calcification of abdominal aorta without aneurysm. Reproductive: Uterus is surgically absent. Other: None. Musculoskeletal: No destructive bone lesions. Degenerative changes in the spine. IMPRESSION: Progression of diffuse lymphoma with subcutaneous deposits demonstrated throughout the subcutaneous fat of the chest, abdomen, and pelvis. Multiple nodular infiltrates throughout the lungs, likely due to lymphoma but could also represent infection or atypical infection. Massive retroperitoneal lymphadenopathy with diffuse retroperitoneal, mesenteric, and pelvic lymphadenopathy throughout. Wall thickening and pelvic small bowel probably represents lymphomas involvement. Probable direct invasion of the left kidney. Staghorn calculi in both kidneys. Electronically Signed   By: Lucienne Capers M.D.   On: 03/17/2016 05:05   Dg Chest Port 1 View  03/17/2016   CLINICAL DATA:  Shortness of breath and right-sided abdominal pain radiating to the left for a few weeks. Decreased oxygen saturation. EXAM: PORTABLE CHEST 1 VIEW COMPARISON:  None. FINDINGS: Cardiac enlargement. Pulmonary vascularity appears normal for technique. Rounded opacity in the right lung base may represent rounded pneumonia or mass lesion. CT suggested for further evaluation. Probable infiltration or atelectasis in the left lung base behind the heart as well. No blunting of costophrenic angles. No pneumothorax. Calcification of the aorta. Degenerative changes in the spine and shoulders. IMPRESSION: Cardiac enlargement. Mass versus consolidation in the right lower lung. Probable consolidation in the left lung base. CT suggested for further evaluation. Electronically Signed   By: Lucienne Capers M.D.   On: 03/17/2016 01:14    Labs:  CBC:  Recent Labs  03/17/16 0109 03/18/16 0453  WBC 5.6 5.3  HGB 9.8* 9.5*  HCT 29.7* 29.1*  PLT 79* 77*    COAGS:  Recent Labs  03/18/16 0453  INR 1.34  APTT 48*    BMP:  Recent Labs  03/17/16 0109  NA 138  K 3.5  CL 105  CO2 21*  GLUCOSE 101*  BUN 30*  CALCIUM 9.7  CREATININE 1.38*  GFRNONAA 34*  GFRAA 39*    LIVER FUNCTION TESTS: No results for input(s): BILITOT, AST, ALT, ALKPHOS, PROT, ALBUMIN in the last 8760 hours.  TUMOR MARKERS: No results for input(s): AFPTM, CEA, CA199, CHROMGRNA in the last 8760 hours.  Assessment and Plan:  Kristy Solomon is a 82 y.o. female with past medical history significant for hypertension, hyperlipidemia and renal insufficiency now with CT findings worrisome for progression of lymphoma. Request made for ultrasound-guided biopsy of dominant soft tissue mass about the right flank as well as CT-guided bone marrow biopsy and aspiration for tissue diagnostic purposes.  Risks and Benefits of both ultrasound-guided soft tissue mass biopsy and CT-guided bone marrow biopsy and aspiration were  discussed with the patient  including, but not limited to bleeding, infection, damage to adjacent structures or low yield requiring additional tests.  All of the patient's questions were answered, patient is agreeable to proceed.  Consent signed and in chart.   Electronically Signed: Sandi Mariscal 03/18/2016, 11:20 AM

## 2016-03-18 NOTE — Care Management (Signed)
Discharge anticipated for tomorrow 06/17. Jason with [facility] updated.

## 2016-03-18 NOTE — Progress Notes (Signed)
[facility] NAME: Kristy Solomon    MR#:  [id number]  DATE OF BIRTH:  [birth year]  SUBJECTIVE:  CHIEF COMPLAINT:   Chief Complaint  Patient presents with  . Shortness of Breath   The patient is a status post biopsy. She is still sleeping due to sedation. Off oxygen by nasal cannula. REVIEW OF SYSTEMS:  Unable to get ROS.  DRUG ALLERGIES:  No Known Allergies  VITALS:  Blood pressure 166/69, pulse 79, temperature 97.7 F (36.5 C), temperature source Oral, resp. rate 16, height 5' (1.524 m), weight 173 lb (78.472 kg), SpO2 96 %.  PHYSICAL EXAMINATION:  GENERAL:  83 y.o.-year-old patient lying in the bed with no acute distress.  EYES: Pupils equal, round, reactive to light and accommodation. No scleral icterus. Extraocular muscles intact.  HEENT: Head atraumatic, normocephalic.   NECK:  Supple, no jugular venous distention. No thyroid enlargement, no tenderness.  LUNGS: Normal breath sounds bilaterally, no wheezing, rales,rhonchi or crepitation. No use of accessory muscles of respiration.  CARDIOVASCULAR: S1, S2 normal. No murmurs, rubs, or gallops.  ABDOMEN: Soft, nontender, nondistended. Bowel sounds present. No organomegaly or mass.  EXTREMITIES: No pedal edema, cyanosis, or clubbing.  NEUROLOGIC: Unable to exam.  PSYCHIATRIC: The patient is sedated. SKIN: No obvious rash, lesion, or ulcer.    LABORATORY PANEL:   CBC  Recent Labs Lab 03/18/16 0453  WBC 5.3  HGB 9.5*  HCT 29.1*  PLT 77*   ------------------------------------------------------------------------------------------------------------------  Chemistries   Recent Labs Lab 03/17/16 0109  NA 138  K 3.5  CL 105  CO2 21*  GLUCOSE 101*  BUN 30*  CREATININE 1.38*  CALCIUM 9.7   ------------------------------------------------------------------------------------------------------------------  Cardiac Enzymes  Recent Labs Lab  03/17/16 0109  TROPONINI <0.03   ------------------------------------------------------------------------------------------------------------------  RADIOLOGY:  Ct Chest W Contrast  03/17/2016  CLINICAL DATA:  Shortness of breath and right abdominal pain radiating to the left side for a few weeks. History of lymphoma. EXAM: CT CHEST, ABDOMEN, AND PELVIS WITH CONTRAST TECHNIQUE: Multidetector CT imaging of the chest, abdomen and pelvis was performed following the standard protocol during bolus administration of intravenous contrast. CONTRAST:  29m ISOVUE-300 IOPAMIDOL (ISOVUE-300) INJECTION 61% COMPARISON:  CT abdomen and pelvis 12/13/2014 FINDINGS: CT CHEST FINDINGS Mediastinum/Lymph Nodes: Normal heart size. Coronary artery calcifications. Normal caliber thoracic aorta. Esophagus is decompressed. Enlarged and heterogeneous left thyroid nodule with retrosternal extension. Lungs/Pleura: Evaluation of lungs is limited due to motion artifact. There are focal areas of masslike consolidation in the lung bases with patchy nodular infiltrative lesions throughout both lungs. There is significant progression since previous study. Changes could be due 2 lymphoma, metastatic disease, or septic emboli with multiple nodular areas of infection. Fungal infection could also have this appearance. Musculoskeletal: There soft tissue nodules demonstrated throughout the subcutaneous fat of the chest wall with axillary and supraclavicular lymphadenopathy. This likely represents lymphoproliferative disorder with diffuse soft tissue lymphoma. Similar findings are present on the previous study but there is likely progression. CT ABDOMEN PELVIS FINDINGS Hepatobiliary: No masses or other significant abnormality. Pancreas: No mass, inflammatory changes, or other significant abnormality. Spleen: Within normal limits in size and appearance. Adrenals/Urinary Tract: Large stones demonstrated in both kidneys without hydronephrosis.  Stomach/Bowel: Small esophageal hiatal hernia. Stomach is decompressed. Small bowel are decompressed. Suggestion of small bowel wall thickening in the pelvis likely to represent lymphomatous involvement. No evidence of obstruction. Vascular/Lymphatic: There are markedly enlarged lymph nodes throughout the retroperitoneum, mesenteric, pelvis, and  throughout the subcutaneous fat. Massive retroperitoneal lymphadenopathy in the left periaortic space causes displacement and deviation of the left kidney with probable direct invasion of the left kidney. This mass measures up to about 13.8 by 10.7 cm. There is progression since previous study. Diffuse calcification of abdominal aorta without aneurysm. Reproductive: Uterus is surgically absent. Other: None. Musculoskeletal: No destructive bone lesions. Degenerative changes in the spine. IMPRESSION: Progression of diffuse lymphoma with subcutaneous deposits demonstrated throughout the subcutaneous fat of the chest, abdomen, and pelvis. Multiple nodular infiltrates throughout the lungs, likely due to lymphoma but could also represent infection or atypical infection. Massive retroperitoneal lymphadenopathy with diffuse retroperitoneal, mesenteric, and pelvic lymphadenopathy throughout. Wall thickening and pelvic small bowel probably represents lymphomas involvement. Probable direct invasion of the left kidney. Staghorn calculi in both kidneys. Electronically Signed   By: Lucienne Capers M.D.   On: 03/17/2016 05:05   Ct Abdomen Pelvis W Contrast  03/17/2016  CLINICAL DATA:  Shortness of breath and right abdominal pain radiating to the left side for a few weeks. History of lymphoma. EXAM: CT CHEST, ABDOMEN, AND PELVIS WITH CONTRAST TECHNIQUE: Multidetector CT imaging of the chest, abdomen and pelvis was performed following the standard protocol during bolus administration of intravenous contrast. CONTRAST:  51m ISOVUE-300 IOPAMIDOL (ISOVUE-300) INJECTION 61% COMPARISON:  CT  abdomen and pelvis 12/13/2014 FINDINGS: CT CHEST FINDINGS Mediastinum/Lymph Nodes: Normal heart size. Coronary artery calcifications. Normal caliber thoracic aorta. Esophagus is decompressed. Enlarged and heterogeneous left thyroid nodule with retrosternal extension. Lungs/Pleura: Evaluation of lungs is limited due to motion artifact. There are focal areas of masslike consolidation in the lung bases with patchy nodular infiltrative lesions throughout both lungs. There is significant progression since previous study. Changes could be due 2 lymphoma, metastatic disease, or septic emboli with multiple nodular areas of infection. Fungal infection could also have this appearance. Musculoskeletal: There soft tissue nodules demonstrated throughout the subcutaneous fat of the chest wall with axillary and supraclavicular lymphadenopathy. This likely represents lymphoproliferative disorder with diffuse soft tissue lymphoma. Similar findings are present on the previous study but there is likely progression. CT ABDOMEN PELVIS FINDINGS Hepatobiliary: No masses or other significant abnormality. Pancreas: No mass, inflammatory changes, or other significant abnormality. Spleen: Within normal limits in size and appearance. Adrenals/Urinary Tract: Large stones demonstrated in both kidneys without hydronephrosis. Stomach/Bowel: Small esophageal hiatal hernia. Stomach is decompressed. Small bowel are decompressed. Suggestion of small bowel wall thickening in the pelvis likely to represent lymphomatous involvement. No evidence of obstruction. Vascular/Lymphatic: There are markedly enlarged lymph nodes throughout the retroperitoneum, mesenteric, pelvis, and throughout the subcutaneous fat. Massive retroperitoneal lymphadenopathy in the left periaortic space causes displacement and deviation of the left kidney with probable direct invasion of the left kidney. This mass measures up to about 13.8 by 10.7 cm. There is progression since  previous study. Diffuse calcification of abdominal aorta without aneurysm. Reproductive: Uterus is surgically absent. Other: None. Musculoskeletal: No destructive bone lesions. Degenerative changes in the spine. IMPRESSION: Progression of diffuse lymphoma with subcutaneous deposits demonstrated throughout the subcutaneous fat of the chest, abdomen, and pelvis. Multiple nodular infiltrates throughout the lungs, likely due to lymphoma but could also represent infection or atypical infection. Massive retroperitoneal lymphadenopathy with diffuse retroperitoneal, mesenteric, and pelvic lymphadenopathy throughout. Wall thickening and pelvic small bowel probably represents lymphomas involvement. Probable direct invasion of the left kidney. Staghorn calculi in both kidneys. Electronically Signed   By: WLucienne CapersM.D.   On: 03/17/2016 05:05   [place]  03/18/2016  INDICATION: History of malignant lymphoplasmacytic lymphoma. Please perform ultrasound-guided lymph node biopsy for tissue diagnostic purposes. EXAM: ULTRASOUND BIOPSY LYMPH NODE BIOPSY COMPARISON:  CT the chest, abdomen and pelvis - 03/17/2026 MEDICATIONS: None ANESTHESIA/SEDATION: Fentanyl 75 mcg IV; Versed 3 mg IV Sedation Time: 25 minutes (total sedation time for both CT-guided bone marrow biopsy and aspiration as well as subsequent ultrasound-guided lymph node biopsy); The patient was continuously monitored during the procedure by the interventional radiology nurse under my direct supervision. COMPLICATIONS: None immediate. TECHNIQUE: Informed written consent was obtained from the patient after a discussion of the risks, benefits and alternatives to treatment. Questions regarding the procedure were encouraged and answered. Initial ultrasound scanning demonstrated a large mixed echogenic solid mass within the subcutaneous tissues about the caudal lateral aspect of the right flank compatible with the dominant nodal conglomeration seen on preceding  abdominal CT. An ultrasound image was saved for documentation purposes. The procedure was planned. A timeout was performed prior to the initiation of the procedure. The operative was prepped and draped in the usual sterile fashion, and a sterile drape was applied covering the operative field. A timeout was performed prior to the initiation of the procedure. Local anesthesia was provided with 1% lidocaine with epinephrine. Under direct ultrasound guidance, an 18 gauge core needle device was utilized to obtain to obtain 6 core needle biopsies of the dominant subcutaneous nodal conglomeration. The samples were placed in saline and submitted to pathology. The needle was removed and hemostasis was achieved with manual compression. Post procedure scan was negative for significant hematoma. A dressing was placed. The patient tolerated the procedure well without immediate postprocedural complication. IMPRESSION: Technically successful ultrasound guided biopsy of dominant subcutaneous nodal conglomeration about the caudal aspect of the right flank. Electronically Signed   By: Sandi Mariscal M.D.   On: 03/18/2016 13:08   Ct Biopsy  03/18/2016  INDICATION: History of malignant lymphoplasmacytic lymphoma. Please perform CT-guided bone marrow biopsy and aspiration for tissue diagnostic purposes. EXAM: CT-GUIDED BONE MARROW BIOPSY AND ASPIRATION MEDICATIONS: None ANESTHESIA/SEDATION: Fentanyl 75 mcg IV; Versed 3 mg IV Sedation Time: 25 minutes (total sedation time for both CT-guided bone marrow biopsy and aspiration as well as subsequent ultrasound-guided lymph node biopsy); The patient was continuously monitored during the procedure by the interventional radiology nurse under my direct supervision. COMPLICATIONS: None immediate. PROCEDURE: Informed consent was obtained from the patient following an explanation of the procedure, risks, benefits and alternatives. The patient understands, agrees and consents for the procedure. All  questions were addressed. A time out was performed prior to the initiation of the procedure. The patient was positioned prone and non-contrast localization CT was performed of the pelvis to demonstrate the iliac marrow spaces. The operative site was prepped and draped in the usual sterile fashion. Under sterile conditions and local anesthesia, a 22 gauge spinal needle was utilized for procedural planning. Next, an 11 gauge coaxial bone biopsy needle was advanced into the right iliac marrow space. Needle position was confirmed with CT imaging. Initially, bone marrow aspiration was performed. Next, a bone marrow biopsy was obtained with the 11 gauge outer bone marrow device. Samples were prepared with the cytotechnologist and deemed adequate. The needle was removed intact. Hemostasis was obtained with compression and a dressing was placed. The patient tolerated the procedure well without immediate post procedural complication. IMPRESSION: Successful CT guided right iliac bone marrow aspiration and core biopsy. Electronically Signed   By: Sandi Mariscal M.D.   On: 03/18/2016 13:06  Dg Chest Port 1 View  03/17/2016  CLINICAL DATA:  Shortness of breath and right-sided abdominal pain radiating to the left for a few weeks. Decreased oxygen saturation. EXAM: PORTABLE CHEST 1 VIEW COMPARISON:  None. FINDINGS: Cardiac enlargement. Pulmonary vascularity appears normal for technique. Rounded opacity in the right lung base may represent rounded pneumonia or mass lesion. CT suggested for further evaluation. Probable infiltration or atelectasis in the left lung base behind the heart as well. No blunting of costophrenic angles. No pneumothorax. Calcification of the aorta. Degenerative changes in the spine and shoulders. IMPRESSION: Cardiac enlargement. Mass versus consolidation in the right lower lung. Probable consolidation in the left lung base. CT suggested for further evaluation. Electronically Signed   By: Lucienne Capers  M.D.   On: 03/17/2016 01:14    EKG:   Orders placed or performed during the hospital encounter of 03/17/16  . ED EKG  . ED EKG  . EKG 12-Lead  . EKG 12-Lead    ASSESSMENT AND PLAN:   Progression of lymphoma Per Dr. Theodis Shove Recommendation, the patient got CT-guided bone marrow biopsy and ultrasound-guided indeterminate mass within the subcutaneous tissues  Biopsy. Follow-up with Dr. Susy Manor for further recommendation.  Pneumonia (CAP) Continue zithromax and rocephin, f/u cultures. Robitussin prn, duoneb prn, off O2 [place] 2L.  Hypoxia. Due to above. Off oxygen by nasal cannula and DuoNeb when necessary.  HTN. Continue lopressor, norvasc, lisinopril-HCTZ.  CKD stage 3. Stable.  Thrombocytopenia. Platelet is low but stable.  All the records are reviewed and case discussed with Care Management/Social Workerr. Management plans discussed with the patient, her daughter and grandson and they are in agreement.  CODE STATUS: Full code  TOTAL TIME TAKING CARE OF THIS PATIENT: 37 minutes.  Greater than 50% time was spent on coordination of care and face-to-face counseling.  POSSIBLE D/C IN 2 DAYS, DEPENDING ON CLINICAL CONDITION.   Demetrios Loll M.D on 03/18/2016 at 3:00 PM  Between 7am to 6pm - Pager - [phone]  After 6pm go to [email or web address] - password [email or web address] [facility]  Office  [phone]  CC: Primary care physician; Volanda Napoleon, MD

## 2016-03-18 NOTE — Progress Notes (Signed)
Post IR Procedure Consult - Anticoagulant/Antiplatelet PTA/Inpatient Med List Review by Pharmacist  Bleeding risk: Standard  PTA med list reviewed, no anticoagulation or antiplatelets PTA  Current orders for enoxaparin 30mg  SQ Q24H  Per protocol will hold enoxaparin x 1 day, resuming tomorrow morning.   Rexene Edison, PharmD Clinical Pharmacist  03/18/2016 12:50 PM

## 2016-03-18 NOTE — Progress Notes (Signed)
Post IR Procedure Consult - Anticoagulant/Antiplatelet PTA/Inpatient Med List Review by Pharmacist  Bleeding risk: Standard  PTA med list reviewed, no anticoagulation or antiplatelets PTA  Current orders for enoxaparin 30mg  SQ Q24H  Per protocol will hold enoxaparin x 1 day, resuming tomorrow morning.   Will continue to follow renal function and platelet count per anticoagulation policy.   Rexene Edison, PharmD Clinical Pharmacist  03/18/2016 12:51 PM
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
## 2016-03-19 ENCOUNTER — Inpatient Hospital Stay
Admit: 2016-03-19 | Discharge: 2016-03-19 | Disposition: A | Discharge status: Home / Self Care | Payer: Medicare HMO | Attending: Hematology and Oncology | Admitting: Hematology and Oncology

## 2016-03-19 LAB — BASIC METABOLIC PANEL
Anion gap: 9 (ref 5–15)
BUN: 34 mg/dL — ABNORMAL HIGH (ref 6–20)
CO2: 24 mmol/L (ref 22–32)
Calcium: 9.4 mg/dL (ref 8.9–10.3)
Chloride: 102 mmol/L (ref 101–111)
Creatinine, Ser: 1.8 mg/dL — ABNORMAL HIGH (ref 0.44–1.00)
GFR calc Af Amer: 29 mL/min — ABNORMAL LOW (ref >60)
GFR calc non Af Amer: 25 mL/min — ABNORMAL LOW (ref >60)
Glucose, Bld: 101 mg/dL — ABNORMAL HIGH (ref 65–99)
Potassium: 4.1 mmol/L (ref 3.5–5.1)
Sodium: 135 mmol/L (ref 135–145)

## 2016-03-19 LAB — HEPATITIS B SURFACE ANTIGEN: Hepatitis B Surface Ag: NEGATIVE

## 2016-03-19 LAB — URIC ACID: Uric Acid, Serum: 11.1 mg/dL — ABNORMAL HIGH (ref 2.3–6.6)

## 2016-03-19 MED ORDER — AZITHROMYCIN 250 MG PO TABS: 500.0000 mg | ORAL_TABLET | Freq: Once | ORAL | Status: DC

## 2016-03-19 MED ORDER — AZITHROMYCIN 500 MG PO TABS: 500.0000 mg | ORAL_TABLET | Freq: Every day | ORAL | Status: DC

## 2016-03-19 MED ORDER — AZITHROMYCIN 250 MG PO TABS
500.0000 mg | ORAL_TABLET | Freq: Every day | ORAL | Status: DC
  Administered 2016-03-20: 500 mg via ORAL
  Filled 2016-03-19: qty 2

## 2016-03-19 MED ORDER — GUAIFENESIN 100 MG/5ML PO SOLN: 5.0000 mL | Freq: Four times a day (QID) | ORAL | Status: DC | PRN

## 2016-03-19 MED ORDER — SODIUM CHLORIDE 0.9 % IV SOLN
INTRAVENOUS | Status: DC
  Administered 2016-03-19 – 2016-03-20 (×2): via INTRAVENOUS

## 2016-03-19 NOTE — Progress Notes (Signed)
*  PRELIMINARY RESULTS* Echocardiogram 2D Echocardiogram has been performed.  Kristy Solomon 03/19/2016, 5:03 PM

## 2016-03-19 NOTE — Progress Notes (Signed)
Pt noted on room air with exertion 86%, room air at rest 88% pt placed back on o2@2lpm  will monitor

## 2016-03-19 NOTE — Consult Note (Addendum)
Pt with lymphoplasmacytic lymphoma in need for port. Pl 77, nml coags, no clinical evidence of active bleeding , no evidence on central vein manipulation. Seen and examined, she is on A/Bs for presumed pneumonia, we will d/w Dr. Bridgett Larsson, ideally we will wait until she is treated for her pneumonia before putting a port in. We will check the schedule and Ask Dr. Azalee Course to do it next week depending on ID status. D/w the pt in detail  ( procedure, risks and benefits) and she understands.

## 2016-03-19 NOTE — Progress Notes (Signed)
PT Cancellation Note  Patient Details Name: Kristy Solomon MRN: [id number] DOB: August 10, 1929   Cancelled Treatment:    Reason Eval/Treat Not Completed: Patient at procedure or test/unavailable.  Will see pt tomorrow.   Ramond Dial 03/19/2016, 4:09 PM    Mee Hives, PT MS Acute Rehab Dept. Number: [place] and [place]

## 2016-03-19 NOTE — Care Management Note (Signed)
Case Management Note  Patient Details  Name: TYQUESHA MCCONATHY MRN: [id number] Date of Birth: Mar 11, 1929  Subjective/Objective:    Discussed discharge planning with Dr Bridgett Larsson. Requested HH-PT, RN, and SW. Requested order for new home oxygen if Mrs Barletta qualifies. Dr Bridgett Larsson stated no discharge today, he will reevaluate tomorrow for discharge and home needs.                 Action/Plan:   Expected Discharge Date:  03/20/16               Expected Discharge Plan:     In-House Referral:     Discharge planning Services  CM Consult  Post Acute Care Choice:  Home Health, Durable Medical Equipment Choice offered to:  Patient  DME Arranged:    DME Agency:     HH Arranged:    [place] Agency:     Status of Service:  In process, will continue to follow  Medicare Important Message Given:    Date Medicare IM Given:    Medicare IM give by:    Date Additional Medicare IM Given:    Additional Medicare Important Message give by:     If discussed at [place] of Stay Meetings, dates discussed:    Additional Comments:  Demarrius Guerrero A, RN 03/19/2016, 11:39 AM

## 2016-03-19 NOTE — Progress Notes (Signed)
[facility] NAME: Kristy Solomon    MR#:  [id number]  DATE OF BIRTH:  October 16, 1928  SUBJECTIVE:  CHIEF COMPLAINT:   Chief Complaint  Patient presents with  . Shortness of Breath   No complaint, on oxygen by nasal cannula 1 L. SAT down to 86% in room air. REVIEW OF SYSTEMS:  CONSTITUTIONAL: No fever, has generalized weakness.  EYES: No blurred or double vision.  EARS, NOSE, AND THROAT: No tinnitus or ear pain.  RESPIRATORY: No cough, shortness of breath, wheezing or hemoptysis.  CARDIOVASCULAR: No chest pain, orthopnea, edema.  GASTROINTESTINAL: No nausea, vomiting, diarrhea but has abdominal pain. No melena or bloody stool. GENITOURINARY: No dysuria, hematuria.  ENDOCRINE: No polyuria, nocturia,  HEMATOLOGY: No anemia, easy bruising or bleeding SKIN: No rash or lesion. MUSCULOSKELETAL: No joint pain or arthritis.  NEUROLOGIC: No tingling, numbness, weakness.  PSYCHIATRY: No anxiety or depression.   DRUG ALLERGIES:  No Known Allergies  VITALS:  Blood pressure 126/48, pulse 59, temperature 98.2 F (36.8 C), temperature source Oral, resp. rate 20, height 5' (1.524 m), weight 173 lb (78.472 kg), SpO2 96 %.  PHYSICAL EXAMINATION:  GENERAL:  82 y.o.-year-old patient lying in the bed with no acute distress.  EYES: Pupils equal, round, reactive to light and accommodation. No scleral icterus. Extraocular muscles intact.  HEENT: Head atraumatic, normocephalic.   NECK:  Supple, no jugular venous distention. No thyroid enlargement, no tenderness.  LUNGS: Normal breath sounds bilaterally, no wheezing, rales,rhonchi or crepitation. No use of accessory muscles of respiration.  CARDIOVASCULAR: S1, S2 normal. No murmurs, rubs, or gallops.  ABDOMEN: Soft, nontender, nondistended. Bowel sounds present. No organomegaly or mass.  EXTREMITIES: No pedal edema, cyanosis, or clubbing.  NEUROLOGIC: Unable to exam.  PSYCHIATRIC:  The patient is sedated. SKIN: No obvious rash, lesion, or ulcer.    LABORATORY PANEL:   CBC  Recent Labs Lab 03/18/16 0453  WBC 5.3  HGB 9.5*  HCT 29.1*  PLT 77*   ------------------------------------------------------------------------------------------------------------------  Chemistries   Recent Labs Lab 03/19/16 0505  NA 135  K 4.1  CL 102  CO2 24  GLUCOSE 101*  BUN 34*  CREATININE 1.80*  CALCIUM 9.4   ------------------------------------------------------------------------------------------------------------------  Cardiac Enzymes  Recent Labs Lab 03/17/16 0109  TROPONINI <0.03   ------------------------------------------------------------------------------------------------------------------  RADIOLOGY:  US Biopsy  03/18/2016  INDICATION: History of malignant lymphoplasmacytic lymphoma. Please perform ultrasound-guided lymph node biopsy for tissue diagnostic purposes. EXAM: ULTRASOUND BIOPSY LYMPH NODE BIOPSY COMPARISON:  CT the chest, abdomen and pelvis - 03/17/2026 MEDICATIONS: None ANESTHESIA/SEDATION: Fentanyl 75 mcg IV; Versed 3 mg IV Sedation Time: 25 minutes (total sedation time for both CT-guided bone marrow biopsy and aspiration as well as subsequent ultrasound-guided lymph node biopsy); The patient was continuously monitored during the procedure by the interventional radiology nurse under my direct supervision. COMPLICATIONS: None immediate. TECHNIQUE: Informed written consent was obtained from the patient after a discussion of the risks, benefits and alternatives to treatment. Questions regarding the procedure were encouraged and answered. Initial ultrasound scanning demonstrated a large mixed echogenic solid mass within the subcutaneous tissues about the caudal lateral aspect of the right flank compatible with the dominant nodal conglomeration seen on preceding abdominal CT. An ultrasound image was saved for documentation purposes. The procedure was  planned. A timeout was performed prior to the initiation of the procedure. The operative was prepped and draped in the usual sterile fashion, and a sterile drape was applied covering the operative  field. A timeout was performed prior to the initiation of the procedure. Local anesthesia was provided with 1% lidocaine with epinephrine. Under direct ultrasound guidance, an 18 gauge core needle device was utilized to obtain to obtain 6 core needle biopsies of the dominant subcutaneous nodal conglomeration. The samples were placed in saline and submitted to pathology. The needle was removed and hemostasis was achieved with manual compression. Post procedure scan was negative for significant hematoma. A dressing was placed. The patient tolerated the procedure well without immediate postprocedural complication. IMPRESSION: Technically successful ultrasound guided biopsy of dominant subcutaneous nodal conglomeration about the caudal aspect of the right flank. Electronically Signed   By: Sandi Mariscal M.D.   On: 03/18/2016 13:08   Ct Biopsy  03/18/2016  INDICATION: History of malignant lymphoplasmacytic lymphoma. Please perform CT-guided bone marrow biopsy and aspiration for tissue diagnostic purposes. EXAM: CT-GUIDED BONE MARROW BIOPSY AND ASPIRATION MEDICATIONS: None ANESTHESIA/SEDATION: Fentanyl 75 mcg IV; Versed 3 mg IV Sedation Time: 25 minutes (total sedation time for both CT-guided bone marrow biopsy and aspiration as well as subsequent ultrasound-guided lymph node biopsy); The patient was continuously monitored during the procedure by the interventional radiology nurse under my direct supervision. COMPLICATIONS: None immediate. PROCEDURE: Informed consent was obtained from the patient following an explanation of the procedure, risks, benefits and alternatives. The patient understands, agrees and consents for the procedure. All questions were addressed. A time out was performed prior to the initiation of the  procedure. The patient was positioned prone and non-contrast localization CT was performed of the pelvis to demonstrate the iliac marrow spaces. The operative site was prepped and draped in the usual sterile fashion. Under sterile conditions and local anesthesia, a 22 gauge spinal needle was utilized for procedural planning. Next, an 11 gauge coaxial bone biopsy needle was advanced into the right iliac marrow space. Needle position was confirmed with CT imaging. Initially, bone marrow aspiration was performed. Next, a bone marrow biopsy was obtained with the 11 gauge outer bone marrow device. Samples were prepared with the cytotechnologist and deemed adequate. The needle was removed intact. Hemostasis was obtained with compression and a dressing was placed. The patient tolerated the procedure well without immediate post procedural complication. IMPRESSION: Successful CT guided right iliac bone marrow aspiration and core biopsy. Electronically Signed   By: Sandi Mariscal M.D.   On: 03/18/2016 13:06    EKG:   Orders placed or performed during the hospital encounter of 03/17/16  . ED EKG  . ED EKG  . EKG 12-Lead  . EKG 12-Lead    ASSESSMENT AND PLAN:   Progression of lymphoma Per Dr. Theodis Shove Recommendation, the patient got CT-guided bone marrow biopsy and ultrasound-guided indeterminate mass within the subcutaneous tissues  Biopsy. Follow-up with Dr. Susy Manor as outpatient. Dr. Dahlia Byes said he cannot put port cath this time due to PNA.  Pneumonia (CAP) Continue zithromax and rocephin, negative blood cultures so far. Robitussin prn, duoneb prn.  Hypoxia. Due to above. She needs oxygen by nasal cannula and DuoNeb when necessary.  HTN. Continue lopressor, norvasc, lisinopril-HCTZ.  ARF on CKD stage 3. Start IV NS, f/u BMP.  Thrombocytopenia. Platelet is low but stable.  All the records are reviewed and case discussed with Care Management/Social Workerr. Management plans discussed with the  patient, her daughter and son and they are in agreement.  CODE STATUS: Full code  TOTAL TIME TAKING CARE OF THIS PATIENT: 37 minutes.  Greater than 50% time was spent on coordination of care and  face-to-face counseling.  POSSIBLE D/C IN 1-2 DAYS, DEPENDING ON CLINICAL CONDITION.   Demetrios Loll M.D on 03/19/2016 at 1:14 PM  Between 7am to 6pm - Pager - [phone]  After 6pm go to [email or web address] - password [email or web address] [facility]  Office  [phone]  CC: Primary care physician; Volanda Napoleon, MD

## 2016-03-19 NOTE — Progress Notes (Signed)
[facility] Hematology/Oncology Progress Note  Date of admission: 03/17/2016  Hospital day:  03/18/2016  Chief Complaint: Kristy Solomon is a 82 y.o. 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  Subjective: The patient denies any complaint.  She is sleeping s/p procedures today.  Social History: The patient is accompanied by her daughter and grandson today.  Allergies: No Known Allergies  Scheduled Medications: . amLODipine  10 mg Oral Daily  . atorvastatin  20 mg Oral Daily  . azithromycin  500 mg Intravenous Q24H  . cefTRIAXone (ROCEPHIN)  IV  1 g Intravenous Q24H  . colchicine  0.3 mg Oral Daily  . enoxaparin (LOVENOX) injection  30 mg Subcutaneous Q24H  . lisinopril  20 mg Oral Daily   And  . hydrochlorothiazide  12.5 mg Oral Daily  . metoprolol tartrate  25 mg Oral BID  . pantoprazole  40 mg Oral Daily    Review of Systems: GENERAL: Fatigue. No fevers. Some sweats. Weight loss of 27 pounds in 2 months. PERFORMANCE STATUS (ECOG): 2 HEENT: No visual changes, runny nose, sore throat, mouth sores or tenderness. Lungs: Shortness of breath. Needs to sit up.. No hemoptysis. Cardiac: No chest pain, palpitations, orthopnea, or PND. GI: No appetite. Abdominal discomfort. No nausea, vomiting, diarrhea, constipation, melena or hematochezia. GU: No urgency, frequency, dysuria, or hematuria. Musculoskeletal: No back pain. No joint pain. No muscle tenderness. Extremities: No pain or swelling. Skin: Nodules in skin have gradually increased over time. No rashes. Neuro: Dizzy at times. No headache, numbness or weakness, balance or coordination issues. Endocrine: No diabetes, thyroid issues, hot flashes or night sweats. Psych: Poor sleep. No mood changes, depression or anxiety. Pain: Abdominal discomfort. Review of systems: All other systems reviewed and found to be negative.  Physical Exam: Blood  pressure 125/48, pulse 67, temperature 98.5 F (36.9 C), temperature source Oral, resp. rate 18, height 5' (1.524 m), weight 173 lb (78.472 kg), SpO2 99 %.  GENERAL: Well developed, well nourished, elderly woman sleeping comfortably on the medical unit in no acute distress. HEAD: Wearing a red cap. Brown hair. Normocephalic, atraumatic, face symmetric, no Cushingoid features. NEUROLOGICAL: Sleeping.   Results for orders placed or performed during the hospital encounter of 03/17/16 (from the past 48 hour(s))  Culture, blood (routine x 2) Call MD if unable to obtain prior to antibiotics being given     Status: None (Preliminary result)   Collection Time: 03/17/16  9:32 AM  Result Value Ref Range   Specimen Description BLOOD LEFT HAND    Special Requests BOTTLES DRAWN AEROBIC AND ANAEROBIC [place]    Culture NO GROWTH < 24 HOURS    Report Status PENDING   HIV antibody     Status: None   Collection Time: 03/17/16  9:49 AM  Result Value Ref Range   HIV Screen 4th Generation wRfx Non Reactive Non Reactive    Comment: (NOTE) Performed At: [facility] [place], [place] [id number] Lindon Romp MD WU:[id number]   Culture, blood (routine x 2) Call MD if unable to obtain prior to antibiotics being given     Status: None (Preliminary result)   Collection Time: 03/17/16  9:49 AM  Result Value Ref Range   Specimen Description BLOOD RIGHT ARM    Special Requests BOTTLES DRAWN AEROBIC AND ANAEROBIC 5CC    Culture NO GROWTH < 24 HOURS    Report Status PENDING   CBC     Status: Abnormal  Collection Time: 03/18/16  4:53 AM  Result Value Ref Range   WBC 5.3 3.6 - 11.0 K/uL   RBC 4.18 3.80 - 5.20 MIL/uL   Hemoglobin 9.5 (L) 12.0 - 16.0 g/dL   HCT 29.1 (L) 35.0 - 47.0 %   MCV 69.6 (L) 80.0 - 100.0 fL   MCH 22.8 (L) 26.0 - 34.0 pg   MCHC 32.8 32.0 - 36.0 g/dL   RDW 20.4 (H) 11.5 - 14.5 %   Platelets 77 (L) 150 - 440 K/uL  Hepatitis B surface antigen     Status: None    Collection Time: 03/18/16  4:53 AM  Result Value Ref Range   Hepatitis B Surface Ag Negative Negative    Comment: (NOTE) Performed At: [facility] [place], [place] [id number] Lindon Romp MD DJ:[id number]   Lactate dehydrogenase     Status: None   Collection Time: 03/18/16  4:53 AM  Result Value Ref Range   LDH 123 98 - 192 U/L  Ferritin     Status: None   Collection Time: 03/18/16  4:53 AM  Result Value Ref Range   Ferritin 27 11 - 307 ng/mL  Iron and TIBC     Status: Abnormal   Collection Time: 03/18/16  4:53 AM  Result Value Ref Range   Iron 23 (L) 28 - 170 ug/dL   TIBC 207 (L) 250 - 450 ug/dL   Saturation Ratios 11 10.4 - 31.8 %   UIBC 184 ug/dL  Vitamin B12     Status: Abnormal   Collection Time: 03/18/16  4:53 AM  Result Value Ref Range   Vitamin B-12 147 (L) 180 - 914 pg/mL    Comment: (NOTE) This assay is not validated for testing neonatal or myeloproliferative syndrome specimens for Vitamin B12 levels. Performed at [facility]   Folate     Status: Abnormal   Collection Time: 03/18/16  4:53 AM  Result Value Ref Range   Folate 5.9 (L) >5.9 ng/mL  Protime-INR     Status: Abnormal   Collection Time: 03/18/16  4:53 AM  Result Value Ref Range   Prothrombin Time 16.7 (H) 11.4 - 15.0 seconds   INR 1.34   APTT     Status: Abnormal   Collection Time: 03/18/16  4:53 AM  Result Value Ref Range   aPTT 48 (H) 24 - 36 seconds    Comment:        IF BASELINE aPTT IS ELEVATED, SUGGEST PATIENT RISK ASSESSMENT BE USED TO DETERMINE APPROPRIATE ANTICOAGULANT THERAPY.   Uric acid     Status: Abnormal   Collection Time: 03/19/16  5:05 AM  Result Value Ref Range   Uric Acid, Serum 11.1 (H) 2.3 - 6.6 mg/dL  Basic metabolic panel     Status: Abnormal   Collection Time: 03/19/16  5:05 AM  Result Value Ref Range   Sodium 135 135 - 145 mmol/L   Potassium 4.1 3.5 - 5.1 mmol/L   Chloride 102 101 - 111 mmol/L   CO2 24 22 - 32 mmol/L   Glucose,  Bld 101 (H) 65 - 99 mg/dL   BUN 34 (H) 6 - 20 mg/dL   Creatinine, Ser 1.80 (H) 0.44 - 1.00 mg/dL   Calcium 9.4 8.9 - 10.3 mg/dL   GFR calc non Af Amer 25 (L) >60 mL/min   GFR calc Af Amer 29 (L) >60 mL/min    Comment: (NOTE) The eGFR has been calculated using the CKD EPI  equation. This calculation has not been validated in all clinical situations. eGFR's persistently <60 mL/min signify possible Chronic Kidney Disease.    Anion gap 9 5 - 15   US Biopsy  03/18/2016  INDICATION: History of malignant lymphoplasmacytic lymphoma. Please perform ultrasound-guided lymph node biopsy for tissue diagnostic purposes. EXAM: ULTRASOUND BIOPSY LYMPH NODE BIOPSY COMPARISON:  CT the chest, abdomen and pelvis - 03/17/2026 MEDICATIONS: None ANESTHESIA/SEDATION: Fentanyl 75 mcg IV; Versed 3 mg IV Sedation Time: 25 minutes (total sedation time for both CT-guided bone marrow biopsy and aspiration as well as subsequent ultrasound-guided lymph node biopsy); The patient was continuously monitored during the procedure by the interventional radiology nurse under my direct supervision. COMPLICATIONS: None immediate. TECHNIQUE: Informed written consent was obtained from the patient after a discussion of the risks, benefits and alternatives to treatment. Questions regarding the procedure were encouraged and answered. Initial ultrasound scanning demonstrated a large mixed echogenic solid mass within the subcutaneous tissues about the caudal lateral aspect of the right flank compatible with the dominant nodal conglomeration seen on preceding abdominal CT. An ultrasound image was saved for documentation purposes. The procedure was planned. A timeout was performed prior to the initiation of the procedure. The operative was prepped and draped in the usual sterile fashion, and a sterile drape was applied covering the operative field. A timeout was performed prior to the initiation of the procedure. Local anesthesia was provided with 1%  lidocaine with epinephrine. Under direct ultrasound guidance, an 18 gauge core needle device was utilized to obtain to obtain 6 core needle biopsies of the dominant subcutaneous nodal conglomeration. The samples were placed in saline and submitted to pathology. The needle was removed and hemostasis was achieved with manual compression. Post procedure scan was negative for significant hematoma. A dressing was placed. The patient tolerated the procedure well without immediate postprocedural complication. IMPRESSION: Technically successful ultrasound guided biopsy of dominant subcutaneous nodal conglomeration about the caudal aspect of the right flank. Electronically Signed   By: Sandi Mariscal M.D.   On: 03/18/2016 13:08   Ct Biopsy  03/18/2016  INDICATION: History of malignant lymphoplasmacytic lymphoma. Please perform CT-guided bone marrow biopsy and aspiration for tissue diagnostic purposes. EXAM: CT-GUIDED BONE MARROW BIOPSY AND ASPIRATION MEDICATIONS: None ANESTHESIA/SEDATION: Fentanyl 75 mcg IV; Versed 3 mg IV Sedation Time: 25 minutes (total sedation time for both CT-guided bone marrow biopsy and aspiration as well as subsequent ultrasound-guided lymph node biopsy); The patient was continuously monitored during the procedure by the interventional radiology nurse under my direct supervision. COMPLICATIONS: None immediate. PROCEDURE: Informed consent was obtained from the patient following an explanation of the procedure, risks, benefits and alternatives. The patient understands, agrees and consents for the procedure. All questions were addressed. A time out was performed prior to the initiation of the procedure. The patient was positioned prone and non-contrast localization CT was performed of the pelvis to demonstrate the iliac marrow spaces. The operative site was prepped and draped in the usual sterile fashion. Under sterile conditions and local anesthesia, a 22 gauge spinal needle was utilized for procedural  planning. Next, an 11 gauge coaxial bone biopsy needle was advanced into the right iliac marrow space. Needle position was confirmed with CT imaging. Initially, bone marrow aspiration was performed. Next, a bone marrow biopsy was obtained with the 11 gauge outer bone marrow device. Samples were prepared with the cytotechnologist and deemed adequate. The needle was removed intact. Hemostasis was obtained with compression and a dressing was placed. The patient tolerated  the procedure well without immediate post procedural complication. IMPRESSION: Successful CT guided right iliac bone marrow aspiration and core biopsy. Electronically Signed   By: Sandi Mariscal M.D.   On: 03/18/2016 13:06    Assessment:  SHARISSE RANTZ is a 82 y.o. female with lymphoplasmacytic lymphoma/Waldenstrom's macroglobulinemia. She has massive subcutaneous deposits, pulmonary nodules, involvement of left kidney, and bowel. She likely has bone marrow involvement secondary to her low counts. Given the massive increase in disease burden over the past 15 months, her lymphoma may have transformed.  Disease was unresponsive to Rituxan in 2014. Previously, she did not wish to purse chemotherapy because of lack of symptoms. Her abdominal discomfort and respiratory symptoms are a direct result of her progressive disease. She has lost 27 pounds in the past 2 months. She denies any blurred vision, headache, or paresthesias.  She underwent SQ mass biopsy and bone marrow aspirate and biopsy on 03/18/2016.  Plan: 1.  Oncology:  Patient wishes to pursue treatment.  Anticipate initiation of treatment on inpatient unit [facility] or [place] next week).  Dr. Alvy Bimler has agreed to accept patient after pathology returns if patient and family desire transfer.  I have discussed with Dr.Lateef concern for tumor lysis syndrome and possible need for dialysis (he has agreed and will come by to talk with the family).  Need to wait on path prior to  initiation of therapy as a transformed lymphoma would be treated differently (Bendamustine + Rituxan versus mini-RCHOP or dose adjusted EPOCH).  Patient has elevated uric acid.  Will need to start renal adjusted allopurinol.  Once G6PD assay back, will begin steroids, hydration, and rasburicase (to reduce uric acid if still elevated).  Rasburicase can cause RBC lysis if G6PD deficient.  Echocardiogram ordered.  Surgery consult for port-a-cath placement.  2.  Hematology:  Await bone marrow.  Suspect marrow involvement.  Patient is iron deficient, B12 deficient, and folic acid deficient.  If unable to take oral iron, would initiate IV iron.  Would start folic acid and F37 supplementation.  3.  Code status:  Patient wishes a DNR/DNI code status.  This was discussed with patient yesterday.  Her family confirmed that remains her wishes.  Kristy Nim, NP will be covering for medical oncology over the weekend.   Lequita Asal, MD  03/18/2016

## 2016-03-20 ENCOUNTER — Other Ambulatory Visit: Payer: Self-pay | Admitting: Family Medicine

## 2016-03-20 LAB — BASIC METABOLIC PANEL
ANION GAP: 10 (ref 5–15)
BUN: 35 mg/dL — ABNORMAL HIGH (ref 6–20)
CHLORIDE: 103 mmol/L (ref 101–111)
CO2: 22 mmol/L (ref 22–32)
CREATININE: 1.48 mg/dL — AB (ref 0.44–1.00)
Calcium: 9 mg/dL (ref 8.9–10.3)
GFR calc non Af Amer: 31 mL/min — ABNORMAL LOW (ref >60)
GFR, EST AFRICAN AMERICAN: 36 mL/min — AB (ref >60)
Glucose, Bld: 107 mg/dL — ABNORMAL HIGH (ref 65–99)
Potassium: 3.6 mmol/L (ref 3.5–5.1)
SODIUM: 135 mmol/L (ref 135–145)

## 2016-03-20 LAB — ECHOCARDIOGRAM COMPLETE
AV Area VTI: 1.86 cm2
AV Peak grad: 12 mmHg
AV peak Index: 1.06
AV pk vel: 175 cm/s
Ao pk vel: 0.73 m/s
Ao-asc: 33 cm
E decel time: 236 msec
FS: 42 % (ref 28–44)
Height: 60 in
IVS/LV PW RATIO, ED: 1.07
LA ID, A-P, ES: 43 mm
LA diam end sys: 43 mm
LA diam index: 2.44 cm/m2
LA vol A4C: 93.9 ml
LA vol index: 53 mL/m2
LA vol: 93.3 mL
LV PW d: 13.4 mm — AB (ref 0.6–1.1)
LVOT area: 2.54 cm2
LVOT diameter: 18 mm
LVOT peak grad rest: 7 mmHg
LVOT peak vel: 128 cm/s
MV Dec: 236
MV Peak grad: 4 mmHg
MV pk A vel: 87.8 m/s
MV pk E vel: 97.5 m/s
Mean grad: 307 mmHg
Weight: 2768 oz

## 2016-03-20 LAB — CBC
HCT: 29.1 % — ABNORMAL LOW (ref 35.0–47.0)
HEMOGLOBIN: 9.4 g/dL — AB (ref 12.0–16.0)
MCH: 22.4 pg — ABNORMAL LOW (ref 26.0–34.0)
MCHC: 32.4 g/dL (ref 32.0–36.0)
MCV: 69.1 fL — AB (ref 80.0–100.0)
PLATELETS: 73 10*3/uL — AB (ref 150–440)
RBC: 4.21 MIL/uL (ref 3.80–5.20)
RDW: 20.6 % — ABNORMAL HIGH (ref 11.5–14.5)
WBC: 5 10*3/uL (ref 3.6–11.0)

## 2016-03-20 NOTE — Progress Notes (Signed)
Pt qualified for o2:   96% on o2@2lpm  at rest    88% on o2 @ 2lpm on exertion    92% on o2 @ 3 lpm on exertion    84% on room air with exertion    88% on room air with rest

## 2016-03-20 NOTE — Discharge Summary (Signed)
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 Hypertension   . Hyperlipidemia   . 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 Renal insufficiency     HOSPITAL COURSE:   Progression of lymphoma Per Dr. Theodis Shove Recommendation, the patient got CT-guided bone marrow biopsy and ultrasound-guided indeterminate mass within the subcutaneous tissues Biopsy. Follow-up with Dr. Susy Manor as outpatient. Dr. Dahlia Byes WILL PERFORM PORT ON June 30 AS OUTPT.   Pneumonia (CAP) She is treated with zithromax and rocephin, negative blood cultures so far. Robitussin prn, duoneb prn. Change to Zithromax by mouth for 5 days after discharge.  Hypoxia. Due to above. The patient to oxygen saturation decreased to 84% in room air and 88% on 2 L oxygen. She needs oxygen by nasal cannula 3 L and DuoNeb when necessary.  HTN. Continue lopressor, norvasc, lisinopril-HCTZ.  ARF on CKD stage 3. Improved to baseline on normal saline IV.  Thrombocytopenia. Platelet is low but stable.  DISCHARGE CONDITIONS:   Stable, Discharge to home with home health and PT today.  CONSULTS OBTAINED:  Treatment Team:  Lequita Asal, MD  DRUG ALLERGIES:  No Known Allergies  DISCHARGE MEDICATIONS:   Current Discharge Medication List    START taking these medications   Details  azithromycin (ZITHROMAX) 500 MG tablet Take 1 tablet (500 mg total) by mouth daily. Qty: 5 tablet, Refills:  0    guaiFENesin (ROBITUSSIN) 100 MG/5ML SOLN Take 5 mLs (100 mg total) by mouth every 6 (six) hours as needed for cough or to loosen phlegm. Qty: 118 mL, Refills: 0      CONTINUE these medications which have NOT CHANGED   Details  atorvastatin (LIPITOR) 20 MG tablet Take 20 mg by mouth daily.    colchicine 0.6 MG tablet Take 1 tablet (0.6 mg total) by mouth daily. Take 2 tablets at onset of pain, then may take 1 more 1 hour later if pain continues. Qty: 3 tablet, Refills: 0    indomethacin (INDOCIN) 50 MG capsule Take 1 capsule (50 mg total) by mouth 3 (three) times daily with meals as needed. Qty: 21 capsule, Refills: 0    lisinopril-hydrochlorothiazide (PRINZIDE,ZESTORETIC) 20-12.5 MG per tablet Take 1 tablet by mouth daily.    metoprolol tartrate (LOPRESSOR) 25 MG tablet Take 25 mg by mouth 2 (two) times daily. Refills: 0    omeprazole (PRILOSEC) 20 MG capsule Take 20 mg by mouth daily. Refills: 0    polyethylene glycol (MIRALAX / GLYCOLAX) packet Take 17 g by mouth daily as needed for mild constipation.          DISCHARGE INSTRUCTIONS:    If you experience worsening of your admission symptoms, develop shortness of breath, life threatening emergency, suicidal or homicidal thoughts you must seek medical attention immediately by calling 911 or calling your MD immediately  if symptoms less severe.  You Must read complete instructions/literature along with all the possible adverse reactions/side effects for all the Medicines you take and that have been prescribed to  you. Take any new Medicines after you have completely understood and accept all the possible adverse reactions/side effects.   Please note  You were cared for by a hospitalist during your hospital stay. If you have any questions about your discharge medications or the care you received while you were in the hospital after you are discharged, you can call the unit and asked to speak with the hospitalist on call if  the hospitalist that took care of you is not available. Once you are discharged, your primary care physician will handle any further medical issues. Please note that NO REFILLS for any discharge medications will be authorized once you are discharged, as it is imperative that you return to your primary care physician (or establish a relationship with a primary care physician if you do not have one) for your aftercare needs so that they can reassess your need for medications and monitor your lab values.    Today   SUBJECTIVE   No complaint.   VITAL SIGNS:  Blood pressure 138/57, pulse 66, temperature 98.4 F (36.9 C), temperature source Oral, resp. rate 17, height 5' (1.524 m), weight 173 lb (78.472 kg), SpO2 88 %.  I/O:   Intake/Output Summary (Last 24 hours) at 03/20/16 1247 Last data filed at 03/20/16 0930  Gross per 24 hour  Intake    240 ml  Output      0 ml  Net    240 ml    PHYSICAL EXAMINATION:  GENERAL:  82 y.o.-year-old patient lying in the bed with no acute distress.  EYES: Pupils equal, round, reactive to light and accommodation. No scleral icterus. Extraocular muscles intact.  HEENT: Head atraumatic, normocephalic. Oropharynx and nasopharynx clear.  NECK:  Supple, no jugular venous distention. No thyroid enlargement, no tenderness.  LUNGS: Normal breath sounds bilaterally, no wheezing, rales,rhonchi or crepitation. No use of accessory muscles of respiration.  CARDIOVASCULAR: S1, S2 normal. No murmurs, rubs, or gallops.  ABDOMEN: Soft, non-tender, non-distended. Bowel sounds present. No organomegaly or mass.  EXTREMITIES: No pedal edema, cyanosis, or clubbing.  NEUROLOGIC: Cranial nerves II through XII are intact. Muscle strength 4/5 in all extremities. Sensation intact. Gait not checked.  PSYCHIATRIC: The patient is alert and oriented x 3.  SKIN: No obvious rash, lesion, or ulcer.   DATA REVIEW:   CBC  Recent Labs Lab 03/20/16 0419  WBC 5.0  HGB 9.4*  HCT  29.1*  PLT 73*    Chemistries   Recent Labs Lab 03/20/16 0419  NA 135  K 3.6  CL 103  CO2 22  GLUCOSE 107*  BUN 35*  CREATININE 1.48*  CALCIUM 9.0    Cardiac Enzymes  Recent Labs Lab 03/17/16 0109  TROPONINI <0.03    Microbiology Results  Results for orders placed or performed during the hospital encounter of 03/17/16  Culture, blood (routine x 2) Call MD if unable to obtain prior to antibiotics being given     Status: None (Preliminary result)   Collection Time: 03/17/16  9:32 AM  Result Value Ref Range Status   Specimen Description BLOOD LEFT HAND  Final   Special Requests BOTTLES DRAWN AEROBIC AND ANAEROBIC 6CC  Final   Culture NO GROWTH 3 DAYS  Final   Report Status PENDING  Incomplete  Culture, blood (routine x 2) Call MD if unable to obtain prior to antibiotics being given     Status: None (Preliminary result)   Collection Time: 03/17/16  9:49 AM  Result Value Ref Range Status  Specimen Description BLOOD RIGHT ARM  Final   Special Requests BOTTLES DRAWN AEROBIC AND ANAEROBIC 5CC  Final   Culture NO GROWTH 3 DAYS  Final   Report Status PENDING  Incomplete    RADIOLOGY:  No results found.      Management plans discussed with the patient, family and they are in agreement.  CODE STATUS:     Code Status Orders        Start     Ordered   03/17/16 0921  Full code   Continuous     03/17/16 0920    Code Status History    Date Active Date Inactive Code Status Order ID Comments User Context   This patient has a current code status but no historical code status.      TOTAL TIME TAKING CARE OF THIS PATIENT: 33 minutes.    Demetrios Loll M.D on 03/20/2016 at 12:47 PM  Between 7am to 6pm - Pager - [phone]  After 6pm go to [email or web address] - password [email or web address] [facility]  Office  [phone]  CC: Primary care physician; Volanda Napoleon, MD

## 2016-03-20 NOTE — Discharge Instructions (Signed)
Heart healthy diet, activity as tolerated. HHPT. Home O2 [place] 2-3 L.

## 2016-03-20 NOTE — Progress Notes (Signed)
Received phone call from patients daughter stating that the home oxygen had not been delivered, asking what to do. I called and spoke with [facility] who stated that Kristy Solomon the patients granddaughter had canceled the oxygen delivery for tonight and rescheduled for in the morning. Called daughter ms Kristy Solomon back to inform her what the status was she states" we did not cancel any oxygen we told them no one would be there to let them in, they are still supposed to be here she is going to run out of oxygen" I apologized for the inconvenience and gave the daughter [facility] number so she can call them directly.

## 2016-03-20 NOTE — Care Management Note (Signed)
Case Management Note  Patient Details  Name: Kristy Solomon MRN: [id number] Date of Birth: 02-15-1929  Subjective/Objective:    A referral for home health PT, RN, Aide, and SW was faxed to [facility] along with requests for new oxygen and a RW to be delivered to [name] in room 103 today.                 Action/Plan:   Expected Discharge Date:  03/20/16               Expected Discharge Plan:     In-House Referral:     Discharge planning Services  CM Consult  Post Acute Care Choice:  Home Health, Durable Medical Equipment Choice offered to:  Patient  DME Arranged:    DME Agency:     HH Arranged:    [place] Agency:     Status of Service:  In process, will continue to follow  Medicare Important Message Given:    Date Medicare IM Given:    Medicare IM give by:    Date Additional Medicare IM Given:    Additional Medicare Important Message give by:     If discussed at [place] of Stay Meetings, dates discussed:    Additional Comments:  Neshawn Aird A, RN 03/20/2016, 10:10 AM

## 2016-03-20 NOTE — Progress Notes (Signed)
WE WILL PERFORM PORT ON June 30 AS OUTPT so she can complete her antibiotics for pneumonia.

## 2016-03-20 NOTE — Plan of Care (Signed)
Problem: Acute Rehab PT Goals(only PT should resolve) Goal: Pt Will Transfer Bed To Chair/Chair To Bed With O2 sats above 90% and on O2 as ordered Goal: Pt Will Ambulate With O2 sats above 90% Goal: Pt Will Go Up/Down Stairs With O2 sats above 90% on O2 as ordered

## 2016-03-20 NOTE — Progress Notes (Signed)
Reviewed d/c paperwork w/pt, daughter and granddaughter.  Reviewed f/u appts and new scripts for abx and robitussin. Talked abt MyChart and [employer],  Switched pt to her home O2.  Reviewed how to keep her nares moist so O2 doesn't burn.  Family helped her get dressed and Nurse Tech wheeled her out.

## 2016-03-20 NOTE — Progress Notes (Signed)
SATURATION QUALIFICATIONS: (This note is used to comply with regulatory documentation for home oxygen)  Patient Saturations on Room Air at Rest = %  Patient Saturations on Room Air while Ambulating = 86%  Patient Saturations on 2 Liters of oxygen while Ambulating = 96%  Please briefly explain why patient needs home oxygen:  Lung cancer, lymphoma  Saturation qualifications provided by Mallie Snooks, RN and Vital Signs.

## 2016-03-20 NOTE — Progress Notes (Signed)
SATURATION QUALIFICATIONS: (This note is used to comply with regulatory documentation for home oxygen)  Patient Saturations on Room Air at Rest = 88%  Patient Saturations on Room Air while Ambulating = 86%  Patient Saturations on 2 Liters of oxygen while Ambulating 96 %  Please briefly explain why patient needs home oxygen: lung cancer Information provided by Mallie Snooks, RN, and vital signs

## 2016-03-20 NOTE — Evaluation (Signed)
Physical Therapy Evaluation Patient Details Name: Kristy Solomon MRN: [id number] DOB: [birth year] Today's Date: 03/20/2016   History of Present Illness  82 yo female with onset of PNA and generalized weakness, hypoxia has been referred to PT to assess O2 sats with no O2 during mobility.  96% on o2@2lpm  at rest,  88% on o2 @ 2lpm on exertion  Clinical Impression  SATURATION QUALIFICATIONS: (This note is used to comply with regulatory documentation for home oxygen)  Patient Saturations on Room Air at Rest = 96%  Patient Saturations on Room Air while Ambulating = 86%  Patient Saturations on 3 Liters of oxygen while Ambulating = 93%  Please briefly explain why patient needs home oxygen:  Pt drops significantly with any mobility without O2 supplementally and in fact needs 3L for gait and 2L at rest.    Follow Up Recommendations Home health PT;Supervision for mobility/OOB    Equipment Recommendations  Rolling walker with 5" wheels    Recommendations for Other Services Rehab consult     Precautions / Restrictions Precautions Precautions: Fall (ck O2 sats with exertion) Restrictions Weight Bearing Restrictions: No      Mobility  Bed Mobility Overal bed mobility: Modified Independent             General bed mobility comments: uses HOB elevated and bedrail  Transfers Overall transfer level: Modified independent Equipment used: 1 person hand held assist;Rolling walker (2 wheeled)             General transfer comment: reminders for hand placement  Ambulation/Gait Ambulation/Gait assistance: Min assist Ambulation Distance (Feet): 150 Feet (x 2 once with HHA and once with RW) Assistive device: Rolling walker (2 wheeled);1 person hand held assist Gait Pattern/deviations: Step-through pattern;Wide base of support;Staggering right;Decreased stride length Gait velocity: reduced Gait velocity interpretation: Below normal speed for age/gender General Gait Details: Pt is  previous furniture walker and listed to R with gait  Stairs            Wheelchair Mobility    Modified Rankin (Stroke Patients Only)       Balance Overall balance assessment: Needs assistance Sitting-balance support: Feet supported Sitting balance-Leahy Scale: Good   Postural control: Posterior lean Standing balance support: Bilateral upper extremity supported;Single extremity supported Standing balance-Leahy Scale: Fair Standing balance comment: reduces to fair- with mobility                             Pertinent Vitals/Pain Pain Assessment: No/denies pain    Home Living Family/patient expects to be discharged to:: Private residence Living Arrangements: Other relatives Available Help at Discharge: Family;Available 24 hours/day Type of Home: House Home Access: Stairs to enter Entrance Stairs-Rails: Right Entrance Stairs-Number of Steps: 2 Home Layout: One level Home Equipment: None Additional Comments: will need RW     Prior Function Level of Independence: Independent (holding furniture)               Hand Dominance        Extremity/Trunk Assessment   Upper Extremity Assessment: Overall WFL for tasks assessed           Lower Extremity Assessment: Generalized weakness      Cervical / Trunk Assessment: Normal  Communication   Communication: No difficulties  Cognition Arousal/Alertness: Awake/alert Behavior During Therapy: WFL for tasks assessed/performed Overall Cognitive Status: Within Functional Limits for tasks assessed  General Comments General comments (skin integrity, edema, etc.): Pt was assessed for O2 with and without nasal cannula    Exercises        Assessment/Plan    PT Assessment Patient needs continued PT services  PT Diagnosis Difficulty walking;Other (comment) (oxygen saturation declines with no O2 supplementally)   PT Problem List Decreased strength;Decreased range of  motion;Decreased activity tolerance;Decreased balance;Decreased mobility;Decreased coordination;Decreased knowledge of use of DME;Decreased safety awareness;Cardiopulmonary status limiting activity;Obesity  PT Treatment Interventions DME instruction;Gait training;Functional mobility training;Therapeutic activities;Therapeutic exercise;Balance training;Stair training;Neuromuscular re-education;Patient/family education   PT Goals (Current goals can be found in the Care Plan section) Acute Rehab PT Goals Patient Stated Goal: to get home today PT Goal Formulation: With patient Time For Goal Achievement: 04/03/16 Potential to Achieve Goals: Good    Frequency Min 2X/week   Barriers to discharge   has help for home    Co-evaluation               End of Session Equipment Utilized During Treatment: Gait belt;Oxygen Activity Tolerance: Patient tolerated treatment well;Treatment limited secondary to medical complications (Comment);Patient limited by fatigue (O2 sat drops) Patient left: in bed;with call bell/phone within reach;with bed alarm set;with nursing/sitter in room Nurse Communication: Mobility status         Time: [id number] PT Time Calculation (min) (ACUTE ONLY): 33 min   Charges:   PT Evaluation $PT Eval Low Complexity: 1 Procedure PT Treatments $Gait Training: 8-22 mins   PT G Codes:        Ramond Dial 03-Apr-2016, 12:25 PM    Mee Hives, PT MS Acute Rehab Dept. Number: [place] [facility] [place]

## 2016-03-21 ENCOUNTER — Other Ambulatory Visit: Payer: Self-pay | Admitting: Hematology and Oncology

## 2016-03-21 DIAGNOSIS — C8300 Small cell B-cell lymphoma, unspecified site: Secondary | ICD-10-CM

## 2016-03-21 LAB — PROTEIN ELECTROPHORESIS, SERUM
A/G Ratio: 0.4 — ABNORMAL LOW (ref 0.7–1.7)
Albumin ELP: 2.9 g/dL (ref 2.9–4.4)
Alpha-1-Globulin: 0.2 g/dL (ref 0.0–0.4)
Alpha-2-Globulin: 0.6 g/dL (ref 0.4–1.0)
Beta Globulin: 1.1 g/dL (ref 0.7–1.3)
Gamma Globulin: 6 g/dL — ABNORMAL HIGH (ref 0.4–1.8)
Globulin, Total: 7.9 g/dL — ABNORMAL HIGH (ref 2.2–3.9)
M-Spike, %: 5 g/dL — ABNORMAL HIGH
Total Protein ELP: 10.8 g/dL — ABNORMAL HIGH (ref 6.0–8.5)

## 2016-03-21 LAB — GLUCOSE 6 PHOSPHATE DEHYDROGENASE
G6PDH: 9.2 U/g{Hb} (ref 4.6–13.5)
Hemoglobin: 9.5 g/dL — ABNORMAL LOW (ref 11.1–15.9)

## 2016-03-22 ENCOUNTER — Other Ambulatory Visit: Payer: Self-pay

## 2016-03-22 ENCOUNTER — Telehealth: Payer: Self-pay | Admitting: Surgery

## 2016-03-22 ENCOUNTER — Other Ambulatory Visit: Payer: Self-pay | Admitting: Hematology and Oncology

## 2016-03-22 DIAGNOSIS — C8300 Small cell B-cell lymphoma, unspecified site: Secondary | ICD-10-CM

## 2016-03-22 LAB — HEPATITIS B CORE ANTIBODY, TOTAL: Hep B Core Total Ab: NEGATIVE

## 2016-03-22 LAB — CULTURE, BLOOD (ROUTINE X 2)
CULTURE: NO GROWTH
CULTURE: NO GROWTH

## 2016-03-22 LAB — VISCOSITY, SERUM: Viscosity, Serum: 5.5 rel.saline — ABNORMAL HIGH (ref 1.6–1.9)

## 2016-03-22 LAB — HEPATITIS C ANTIBODY: HCV Ab: <0.1 s/co ratio (ref 0.0–0.9)

## 2016-03-22 LAB — IGG, IGA, IGM
IgA: 187 mg/dL (ref 64–422)
IgG (Immunoglobin G), Serum: 696 mg/dL — ABNORMAL LOW (ref 700–1600)
IgM, Serum: >5850 mg/dL — ABNORMAL HIGH (ref 26–217)

## 2016-03-22 LAB — BETA 2 MICROGLOBULIN, SERUM: Beta-2 Microglobulin: 9.6 mg/L — ABNORMAL HIGH (ref 0.6–2.4)

## 2016-03-22 NOTE — Telephone Encounter (Signed)
Pt's granddaughter was advised of pre op date/time and sx date. Sx: 04/01/16 with Dr Jeremy Johann Placement. Pre op: 03/25/16 between 9-1:00pm--phone.   Patient made aware to call [phone], between 1-3:00pm the day before surgery, to find out what time to arrive.

## 2016-03-23 ENCOUNTER — Inpatient Hospital Stay: Payer: Medicare HMO

## 2016-03-23 ENCOUNTER — Other Ambulatory Visit: Payer: Self-pay | Admitting: Hematology and Oncology

## 2016-03-23 ENCOUNTER — Inpatient Hospital Stay: Payer: Medicare HMO | Attending: Hematology and Oncology | Admitting: Hematology and Oncology

## 2016-03-23 VITALS — BP 152/72 | HR 65 | Temp 98.4°F | Resp 18 | Ht 63.0 in | Wt 170.2 lb

## 2016-03-23 DIAGNOSIS — C8300 Small cell B-cell lymphoma, unspecified site: Secondary | ICD-10-CM

## 2016-03-23 DIAGNOSIS — D509 Iron deficiency anemia, unspecified: Secondary | ICD-10-CM

## 2016-03-23 DIAGNOSIS — Z79899 Other long term (current) drug therapy: Secondary | ICD-10-CM | POA: Diagnosis not present

## 2016-03-23 DIAGNOSIS — R590 Localized enlarged lymph nodes: Secondary | ICD-10-CM | POA: Diagnosis not present

## 2016-03-23 DIAGNOSIS — E538 Deficiency of other specified B group vitamins: Secondary | ICD-10-CM

## 2016-03-23 DIAGNOSIS — R918 Other nonspecific abnormal finding of lung field: Secondary | ICD-10-CM | POA: Insufficient documentation

## 2016-03-23 DIAGNOSIS — C8303 Small cell B-cell lymphoma, intra-abdominal lymph nodes: Secondary | ICD-10-CM | POA: Insufficient documentation

## 2016-03-23 DIAGNOSIS — R0602 Shortness of breath: Secondary | ICD-10-CM | POA: Diagnosis not present

## 2016-03-23 DIAGNOSIS — E785 Hyperlipidemia, unspecified: Secondary | ICD-10-CM | POA: Diagnosis not present

## 2016-03-23 DIAGNOSIS — E790 Hyperuricemia without signs of inflammatory arthritis and tophaceous disease: Secondary | ICD-10-CM

## 2016-03-23 DIAGNOSIS — I129 Hypertensive chronic kidney disease with stage 1 through stage 4 chronic kidney disease, or unspecified chronic kidney disease: Secondary | ICD-10-CM | POA: Diagnosis not present

## 2016-03-23 DIAGNOSIS — N189 Chronic kidney disease, unspecified: Secondary | ICD-10-CM | POA: Insufficient documentation

## 2016-03-23 MED ORDER — CYANOCOBALAMIN 1000 MCG/ML IJ SOLN: 1000.0000 ug | Freq: Once | INTRAMUSCULAR | Status: AC

## 2016-03-23 MED ORDER — FERROUS SULFATE 325 (65 FE) MG PO TBEC: DELAYED_RELEASE_TABLET | ORAL | Status: DC

## 2016-03-23 MED ORDER — ALLOPURINOL 100 MG PO TABS: 200.0000 mg | ORAL_TABLET | Freq: Every day | ORAL | Status: DC

## 2016-03-23 MED ORDER — FOLIC ACID 1 MG PO TABS: 1.0000 mg | ORAL_TABLET | Freq: Every day | ORAL | Status: DC

## 2016-03-23 NOTE — Progress Notes (Signed)
Pt reports no changes since hospitalization.  Get SOB with ADL's on 2L O2

## 2016-03-23 NOTE — Progress Notes (Signed)
[facility] day:  03/23/2016  Chief Complaint: Kristy Solomon is a 82 y.o. 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  HPI: The patient initially presented on 06/21/2012 with abdominal and flank pain. CT scans revealed lymphadenopathy. Abdominal wall mass biopsy in 10/2011 revealed a CD20 positive lymphoplasmacytic lymphoma. She was treated with weekly Rituxan x 4 beginning in 12/2012. PET scan revealed no response to treatment.  Imaging studies showed multiple breast masses. On 02/14/2013, she underwent breast biopsy. Pathology confirmed lymphoma. She was observed without intervention.  The patient was initially sen by Dr. Inez Pilgrim at [facility]. She has been seen twice by Dr. Alvy Bimler in [place]. Last appointment with Dr. Alvy Bimler on 07/04/2014 revealed resolution of periumbilical pain. She denied any weight loss. Exam revealed no palpable adenopathy.   Labs on 07/04/2014 revealed a hematocrit of 41.8, hemoglobin 13.3, MCV 78.2, platelets 128,000, WBC 4100 with a normal differential. Comprehensive metabolic panel revealed a BUN 30.9, creatinine 1.8, protein 9.7 (high), and albumen 3.0. LDH was 161, uric acid was 10.9 (2.6 - 7.4). SPEP revealed a 2.7 gm/dL monoclonal protein. IgM was 4750 (52-322). Kappa free light chains were 75.6, lambda free light chains 2.25 and ratio 33.6 (high). Beta-2 microglobulin was 6.74 (high). Iron studies included a ferritin of 27 and an iron saturation of 18 % (low).  Abdominal and pelvic CT scan on 12/13/2014 revealed progression of extensive lymphomatous involvement of the left kidney (10.3 cm), bilateral lower lungs (4.3 x 3.6 cm, 3.5 x 2.9 cm posterior basilar segment of LLL and 4.7 cm RML), retoperitoneal and mesenteric adenopathy and extensive subcutaneous lymphomatous implants.   She was lost to follow-up.   She  presented to [facility] on 03/17/2016 with shortness of breath.  She noted a 27 pound weight loss over the past 2 months secondary to loss of appetite. For the past 2 months, she has had progressive abdominal pain. She denied any fevers. She had some sweats. She noted intermittent dizziness over the past year. She noted that the masses in her SQ tissues had gradually increased in size. She had to sit up to breathe. She felt weak. She denied any blurred vision, headache, or paresthesias.  Chest, abdomen, and pelvic CT scan on 03/17/2016 revealed progression of diffuse lymphoma with subcutaneous deposits throughout the subcutaneous fat of the chest, abdomen,and pelvis. There were multiple nodular infiltrates throughout the lungs, likely due to lymphoma but could also represent infection or atypical infection. There was massive retroperitoneal lymphadenopathy with diffuse retroperitoneal, mesenteric, and pelvic lymphadenopathy. There was wall thickening and pelvicsmall bowel probably representing lymphomas involvement. There was probable direct invasion of the left kidney.  Labs on 03/17/2016 revealed a hematocrit of 29.7, hemoglobin 9.8, MCV 68.2, platelets 79,000, and WBC 5600. BUN was 30 and creatinine 1.38 with a CrCl of 39 ml/min. HIV testing was negative.  She was admitted to [facility] from 03/17/2016 - 03/20/2016.  She was diagnosed with pneumonia.  She was treated with zithromycin and ceftriaxone .  Cultures were negative.  She was discharged on azithromycin for 5 days.  During her admission, she underwent CT guided bone marrow aspirate and biopsy on 03/18/2016.  Pathology revealed no evidence of lymphoma or plasma cell neoplasm (0.2% kappa monoclonal plasma cells by flow analysis).  Marrow was hypercellular for age (50-60%) with trilineage hematopoiesis and no increased blasts. There was mild to moderate widespread increase in reticulin fibers. There was  no storage iron detected.  She underwent  ultrasound guided biopsy of the dominant SQ nodal conglomeration around the right flank on 03/18/2016.  Preliminary pathology revealed a persistent low-grade CD5 positive B-cell lymphoma. Flow cytometry was positive for CD20, CD79a, CD5, CD138. Neoplastic cells were positive for kappa and cyclin D1.  CD 23 was negative.   Additional molecular markers are pending.  Labs during her admission was notable for the following:  BUN was 30-35, creatinine was 1.38 - 1.80 (GFR 29-39 ml/min). Uric acid was 11.1 on 03/19/2016.  Ferritin was 27.  Iron saturation was 11%, iron 23, TIBC 207.  Vitamin B12 was 147 (low).  Folate was 5.9 (> 5.9).  PT was 16.7, INR was 1.34.  PTT 48.    CBC on 03/20/2016 revealed a hematocrit of 29.1, hemoglobin 9.4, MCV 69.1, platelets 73,000, and WBC 5000. G6PD assay was 9.2 (4.6-13.5).  Hepatitis B surface antigen, hepatitis B core antibody total, and hepatitis C anitbody was negative.  HIV testing was negative.  LDH was 123 (normal).  SPEP revealed a 5.0 gm/dl monoclonal spike.  IgG was 696, IgA 187, and IgM > 5850.  Beta2-microglobulin was 9.6 (0.6-2.4).  Serum viscosity was 5.5 (1.6-1.9).  Symptomatically, she notes shortness of breath with activities of daily living.  She is on 2 liters of oxygen.  She spends the majority of her day resting and sitting in a chair.  She denies any hypervisosity symptoms of nasal bleeding, blurry vision, headache, confusion, vertigo, dizziness, tinnitus, diplopia, or ataxia.    Past Medical History  Diagnosis Date  . Hypertension   . Hyperlipidemia   . Malignant lymphoma, lymphoplasmacytic ([place]) 12/27/2013  . 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 Renal insufficiency     Past Surgical History  Procedure Laterality Date  . Hemorrhoid surgery    . Abdominal hysterectomy      Family History  Problem Relation Age of Onset  . Cancer Brother     throat ca  . Cancer Brother     bone cancer    Social History:  reports that she has never  smoked. She has never used smokeless tobacco. She reports that she does not drink alcohol or use illicit drugs.  She lives in [place] with her grandson and his wife. She is accompanied by her grand-daughter, Cecille Po, today.  Allergies: No Known Allergies  Current Medications: Current Outpatient Prescriptions  Medication Sig Dispense Refill  . aspirin EC 81 MG tablet Take by mouth.    Marland Kitchen atorvastatin (LIPITOR) 20 MG tablet Take 20 mg by mouth daily.    Marland Kitchen azithromycin (ZITHROMAX) 500 MG tablet Take 1 tablet (500 mg total) by mouth daily. 5 tablet 0  . colchicine 0.6 MG tablet Take 1 tablet (0.6 mg total) by mouth daily. Take 2 tablets at onset of pain, then may take 1 more 1 hour later if pain continues. 3 tablet 0  . guaiFENesin (ROBITUSSIN) 100 MG/5ML SOLN Take 5 mLs (100 mg total) by mouth every 6 (six) hours as needed for cough or to loosen phlegm. 118 mL 0  . ibuprofen (ADVIL,MOTRIN) 600 MG tablet Take by mouth.    . indomethacin (INDOCIN) 50 MG capsule Take 1 capsule (50 mg total) by mouth 3 (three) times daily with meals as needed. (Patient taking differently: Take 50 mg by mouth 3 (three) times daily with meals as needed for mild pain. ) 21 capsule 0  . lisinopril-hydrochlorothiazide (PRINZIDE,ZESTORETIC) 20-12.5 MG per tablet Take 1 tablet by mouth daily.    Marland Kitchen  metoprolol tartrate (LOPRESSOR) 25 MG tablet Take 25 mg by mouth 2 (two) times daily.  0  . omeprazole (PRILOSEC) 20 MG capsule Take 20 mg by mouth daily.  0  . polyethylene glycol (MIRALAX / GLYCOLAX) packet Take 17 g by mouth daily as needed for mild constipation.     Marland Kitchen nystatin cream (MYCOSTATIN) Apply topically. Reported on 03/23/2016     No current facility-administered medications for this visit.    Review of Systems:  GENERAL: Fatigue. No fevers. Some sweats. Weight loss of 27 pounds in 2 months. PERFORMANCE STATUS (ECOG): 2 HEENT: No visual changes, runny nose, sore throat, mouth sores or tenderness. Lungs:  Shortness of breath with ADLs. Needs to sit up. No hemoptysis. Cardiac: No chest pain, palpitations, orthopnea, or PND. GI:  Poor appetite. Abdominal discomfort. No nausea, vomiting, diarrhea, constipation, melena or hematochezia. GU: No urgency, frequency, dysuria, or hematuria. Musculoskeletal: No back pain. No joint pain. No muscle tenderness. Extremities: No pain or swelling. Skin: Nodules in skin have gradually increased over time. No rashes. Neuro: No headache, numbness or weakness, balance or coordination issues. Endocrine: No diabetes, thyroid issues, hot flashes or night sweats. Psych: Poor sleep. No mood changes, depression or anxiety. Pain: Abdominal discomfort. Review of systems: All other systems reviewed and found to be negative.  Physical Exam: Blood pressure 152/72, pulse 65, temperature 98.4 F (36.9 C), temperature source Tympanic, resp. rate 18, height _0  (1.6 m), weight 170 lb 3.1 oz (77.2 kg). GENERAL: Elderly woman sitting comfortably in a wheelchair in clinic in no acute distress. MENTAL STATUS: Alert and oriented to person, place and time. HEAD: Brown hair. Normocephalic, atraumatic, face symmetric, no Cushingoid features. EYES: Brown eyes. Pupils equal round and reactive to light and accomodation. No conjunctivitis or scleral icterus. ENT: [place] in place.  Oropharynx clear without lesion. Tongue normal. Mucous membranes moist.  RESPIRATORY: Poor respiratory excursion. No wheezes or rhonchi. CARDIOVASCULAR: Regular rate and rhythm without murmur, rub or gallop. ABDOMEN: Soft, non-tender with active bowel sounds and no appreciable hepatosplenomegaly. Large right sided abdominal fullness/mass.   SKIN: Extensive nodules in subcutaneous tissue (scalp, flank, back, paravertebral, abdominal wall, left inguinal region).  EXTREMITIES: No edema, no skin discoloration or tenderness. No palpable cords. LYMPH NODES: No palpable cervical,  supraclavicular, axillary or inguinal adenopathy  NEUROLOGICAL: Unremarkable. PSYCH: Appropriate   No visits with results within 3 Day(s) from this visit. Latest known visit with results is:  Admission on 03/17/2016, Discharged on 03/20/2016  Component Date Value Ref Range Status  . Sodium 03/17/2016 138  135 - 145 mmol/L Final  . Potassium 03/17/2016 3.5  3.5 - 5.1 mmol/L Final  . Chloride 03/17/2016 105  101 - 111 mmol/L Final  . CO2 03/17/2016 21* 22 - 32 mmol/L Final  . Glucose, Bld 03/17/2016 101* 65 - 99 mg/dL Final  . BUN 03/17/2016 30* 6 - 20 mg/dL Final  . Creatinine, Ser 03/17/2016 1.38* 0.44 - 1.00 mg/dL Final  . Calcium 03/17/2016 9.7  8.9 - 10.3 mg/dL Final  . GFR calc non Af Amer 03/17/2016 34* >60 mL/min Final  . GFR calc Af Amer 03/17/2016 39* >60 mL/min Final   Comment: (NOTE) The eGFR has been calculated using the CKD EPI equation. This calculation has not been validated in all clinical situations. eGFR's persistently <60 mL/min signify possible Chronic Kidney Disease.   . Anion gap 03/17/2016 12  5 - 15 Final  . WBC 03/17/2016 5.6  3.6 - 11.0 K/uL Final  . RBC  03/17/2016 4.35  3.80 - 5.20 MIL/uL Final  . Hemoglobin 03/17/2016 9.8* 12.0 - 16.0 g/dL Final  . HCT 03/17/2016 29.7* 35.0 - 47.0 % Final  . MCV 03/17/2016 68.2* 80.0 - 100.0 fL Final  . MCH 03/17/2016 22.5* 26.0 - 34.0 pg Final  . MCHC 03/17/2016 33.0  32.0 - 36.0 g/dL Final  . RDW 03/17/2016 20.6* 11.5 - 14.5 % Final  . Platelets 03/17/2016 79* 150 - 440 K/uL Final   PLATELET COUNT CONFIRMED BY SMEAR  . Troponin I 03/17/2016 <0.03  <0.031 ng/mL Final   Comment:        NO INDICATION OF MYOCARDIAL INJURY.   Marland Kitchen HIV Screen 4th Generation wRfx 03/17/2016 Non Reactive  Non Reactive Final   Comment: (NOTE) Performed At: [facility] [place], [place] [id number] Lindon Romp MD RK:[id number]   . Specimen Description 03/17/2016 BLOOD RIGHT ARM   Final  . Special Requests  03/17/2016 BOTTLES DRAWN AEROBIC AND ANAEROBIC 5CC   Final  . Culture 03/17/2016 NO GROWTH 5 DAYS   Final  . Report Status 03/17/2016 03/22/2016 FINAL   Final  . Specimen Description 03/17/2016 BLOOD LEFT HAND   Final  . Special Requests 03/17/2016 BOTTLES DRAWN AEROBIC AND ANAEROBIC 6CC   Final  . Culture 03/17/2016 NO GROWTH 5 DAYS   Final  . Report Status 03/17/2016 03/22/2016 FINAL   Final  . WBC 03/18/2016 5.3  3.6 - 11.0 K/uL Final  . RBC 03/18/2016 4.18  3.80 - 5.20 MIL/uL Final  . Hemoglobin 03/18/2016 9.5* 12.0 - 16.0 g/dL Final  . HCT 03/18/2016 29.1* 35.0 - 47.0 % Final  . MCV 03/18/2016 69.6* 80.0 - 100.0 fL Final  . MCH 03/18/2016 22.8* 26.0 - 34.0 pg Final  . MCHC 03/18/2016 32.8  32.0 - 36.0 g/dL Final  . RDW 03/18/2016 20.4* 11.5 - 14.5 % Final  . Platelets 03/18/2016 77* 150 - 440 K/uL Final  . Hepatitis B Surface Ag 03/18/2016 Negative  Negative Final   Comment: (NOTE) Performed At: [facility] [place], [place] [id number] Lindon Romp MD VP:[id number]   . Hep B Core Total Ab 03/18/2016 Negative  Negative Final   Comment: (NOTE) Performed At: [facility] [place], [place] [id number] Lindon Romp MD XF:[id number]   . HCV Ab 03/18/2016 <0.1  0.0 - 0.9 s/co ratio Final   Comment: (NOTE)                                  Negative:     < 0.8                             Indeterminate: 0.8 - 0.9                                  Positive:     > 0.9 The CDC recommends that a positive HCV antibody result be followed up with a HCV Nucleic Acid Amplification test ([id number]). Performed At: [facility] [place], [place] [id number] Lindon Romp MD HE:[id number]   . LDH 03/18/2016 123  98 - 192 U/L Final  . Total Protein ELP 03/18/2016 10.8* 6.0 - 8.5 g/dL Final   **Verified by repeat analysis**  . Albumin ELP 03/18/2016 2.9  2.9 - 4.4 g/dL Final  . Alpha-1-Globulin 03/18/2016 0.2  0.0 - 0.4  g/dL Final  . Alpha-2-Globulin 03/18/2016 0.6  0.4 - 1.0 g/dL Final  . Beta Globulin 03/18/2016 1.1  0.7 - 1.3 g/dL Final  . Gamma Globulin 03/18/2016 6.0* 0.4 - 1.8 g/dL Final  . M-Spike, % 03/18/2016 5.0* Not Observed g/dL Final  . SPE Interp. 03/18/2016 Comment   Final   Comment: (NOTE) The SPE pattern demonstrates a single peak (M-spike) in the gamma region which may represent monoclonal protein. This peak may also be caused by circulating immune complexes, cryoglobulins, C-reactive protein, fibrinogen or hemolysis.  If clinically indicated, the presence of a monoclonal gammopathy may be confirmed by immuno- fixation, as well as an evaluation of the urine for the presence of Bence-Jones protein. Performed At: [facility] [place], [place] [id number] Lindon Romp MD EZ:[id number]   . Comment 03/18/2016 Comment   Final   Comment: (NOTE) Protein electrophoresis scan will follow via computer, mail, or courier delivery.   Marland Kitchen GLOBULIN, TOTAL 03/18/2016 7.9* 2.2 - 3.9 g/dL Corrected  . A/G Ratio 03/18/2016 0.4* 0.7 - 1.7 Corrected  . IgG (Immunoglobin G), Serum 03/18/2016 696* 700 - 1600 mg/dL Final  . IgA 03/18/2016 187  64 - 422 mg/dL Final  . IgM, Serum 03/18/2016 >5850* 26 - 217 mg/dL Final   Comment: (NOTE) Results confirmed on dilution. Performed At: [facility] [place], [place] [id number] Lindon Romp MD LE:[id number]   . Beta-2 Microglobulin 03/18/2016 9.6* 0.6 - 2.4 mg/L Final   Comment: (NOTE) Performed At: [facility] [place], [place] [id number] Lindon Romp MD BW:[id number]   . Viscosity, Serum 03/18/2016 5.5* 1.6 - 1.9 rel.saline Final   Comment: (NOTE) **Results verified by repeat testing** Values above 2.7 may indicate paraproteinemia is present. This test was developed and its performance characteristics determined by [facility]. It has not been cleared or approved by the Food  and Drug Administration. Performed At: [facility] [place], [place] [id number] Lindon Romp MD SP:[id number]   . Ferritin 03/18/2016 27  11 - 307 ng/mL Final  . Iron 03/18/2016 23* 28 - 170 ug/dL Final  . TIBC 03/18/2016 207* 250 - 450 ug/dL Final  . Saturation Ratios 03/18/2016 11  10.4 - 31.8 % Final  . UIBC 03/18/2016 184   Final  . Vitamin B-12 03/18/2016 147* 180 - 914 pg/mL Final   Comment: (NOTE) This assay is not validated for testing neonatal or myeloproliferative syndrome specimens for Vitamin B12 levels. Performed at [facility]   . Folate 03/18/2016 5.9* >5.9 ng/mL Final  . Prothrombin Time 03/18/2016 16.7* 11.4 - 15.0 seconds Final  . INR 03/18/2016 1.34   Final  . aPTT 03/18/2016 48* 24 - 36 seconds Final   Comment:        IF BASELINE aPTT IS ELEVATED, SUGGEST PATIENT RISK ASSESSMENT BE USED TO DETERMINE APPROPRIATE ANTICOAGULANT THERAPY.   . Hemoglobin 03/19/2016 9.5* 11.1 - 15.9 g/dL Final  . G6PDH 03/19/2016 9.2  4.6 - 13.5 U/g Hb Final   Comment: (NOTE) When decreased, G-6-PD, Quant. values are associated with acute hemolytic anemia when deficient individuals are exposed to oxidative stress, such as with certain medications (e.g., primaquine), infection, or ingestion of fava beans. Caution: In patients with acute hemolysis (e.g., abnormally low RBC values), testing for G-6-PD may be falsely normal because older erythrocytes with a higher enzyme deficiency have been hemolyzed.  Young erythrocytes and reticulocytes have normal or near-normal enzyme activity. Normal values of G-6-PD may be measured for several weeks following a hemolytic event. Performed At: [facility] [place], [place] [id number] Lindon Romp MD YK:[id number]   . Uric Acid, Serum 03/19/2016 11.1* 2.3 - 6.6 mg/dL Final  . Sodium 03/19/2016 135  135 - 145 mmol/L Final  . Potassium 03/19/2016 4.1  3.5 - 5.1 mmol/L Final  .  Chloride 03/19/2016 102  101 - 111 mmol/L Final  . CO2 03/19/2016 24  22 - 32 mmol/L Final  . Glucose, Bld 03/19/2016 101* 65 - 99 mg/dL Final  . BUN 03/19/2016 34* 6 - 20 mg/dL Final  . Creatinine, Ser 03/19/2016 1.80* 0.44 - 1.00 mg/dL Final  . Calcium 03/19/2016 9.4  8.9 - 10.3 mg/dL Final  . GFR calc non Af Amer 03/19/2016 25* >60 mL/min Final  . GFR calc Af Amer 03/19/2016 29* >60 mL/min Final   Comment: (NOTE) The eGFR has been calculated using the CKD EPI equation. This calculation has not been validated in all clinical situations. eGFR's persistently <60 mL/min signify possible Chronic Kidney Disease.   . Anion gap 03/19/2016 9  5 - 15 Final  . Weight 03/19/2016 2768   Final-Edited  . Height 03/19/2016 60   Final-Edited  . BP 03/19/2016 126/48   Final-Edited  . LV PW d 03/19/2016 13.4* 0.6 - 1.1 mm Final-Edited  . FS 03/19/2016 42  28 - 44 % Final-Edited  . LA vol 03/19/2016 93.3   Final-Edited  . Ao-asc 03/19/2016 33   Final-Edited  . LA ID, A-P, ES 03/19/2016 43   Final-Edited  . IVS/LV PW RATIO, ED 03/19/2016 1.07   Final-Edited  . AV pk vel 03/19/2016 175   Final-Edited  . AV Area VTI 03/19/2016 1.86   Final-Edited  . LA diam index 03/19/2016 2.44   Final-Edited  . LA vol A4C 03/19/2016 93.9   Final-Edited  . LVOT peak grad rest 03/19/2016 7   Final-Edited  . E decel time 03/19/2016 236   Final-Edited  . LVOT diameter 03/19/2016 18   Final-Edited  . LVOT area 03/19/2016 2.54   Final-Edited  . LVOT peak vel 03/19/2016 128   Final-Edited  . Ao pk vel 03/19/2016 .73   Final-Edited  . Peak grad 03/19/2016 12   Final-Edited  . Peak grad 03/19/2016 4   Final-Edited  . Mean grad 03/19/2016 307   Final-Edited  . MV pk E vel 03/19/2016 97.5   Final-Edited  . MV pk A vel 03/19/2016 87.8   Final-Edited  . LA vol index 03/19/2016 53   Final-Edited  . AV peak Index 03/19/2016 1.06   Final-Edited  . MV Dec 03/19/2016 236   Final-Edited  . LA diam end sys 03/19/2016 43    Final-Edited  . Sodium 03/20/2016 135  135 - 145 mmol/L Final  . Potassium 03/20/2016 3.6  3.5 - 5.1 mmol/L Final  . Chloride 03/20/2016 103  101 - 111 mmol/L Final  . CO2 03/20/2016 22  22 - 32 mmol/L Final  . Glucose, Bld 03/20/2016 107* 65 - 99 mg/dL Final  . BUN 03/20/2016 35* 6 - 20 mg/dL Final  . Creatinine, Ser 03/20/2016 1.48* 0.44 - 1.00 mg/dL Final  . Calcium 03/20/2016 9.0  8.9 - 10.3 mg/dL Final  . GFR calc non Af Amer 03/20/2016 31* >60 mL/min Final  . GFR calc Af Amer 03/20/2016 36* >60 mL/min Final   Comment: (NOTE) The eGFR  has been calculated using the CKD EPI equation. This calculation has not been validated in all clinical situations. eGFR's persistently <60 mL/min signify possible Chronic Kidney Disease.   . Anion gap 03/20/2016 10  5 - 15 Final  . WBC 03/20/2016 5.0  3.6 - 11.0 K/uL Final  . RBC 03/20/2016 4.21  3.80 - 5.20 MIL/uL Final  . Hemoglobin 03/20/2016 9.4* 12.0 - 16.0 g/dL Final  . HCT 03/20/2016 29.1* 35.0 - 47.0 % Final  . MCV 03/20/2016 69.1* 80.0 - 100.0 fL Final  . MCH 03/20/2016 22.4* 26.0 - 34.0 pg Final  . MCHC 03/20/2016 32.4  32.0 - 36.0 g/dL Final  . RDW 03/20/2016 20.6* 11.5 - 14.5 % Final  . Platelets 03/20/2016 73* 150 - 440 K/uL Final   COUNT MAY BE INACCURATE DUE TO FIBRIN CLUMPS.    Assessment:  JANEQUA KIPNIS is a 82 y.o. 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 She has massive subcutaneous deposits, pulmonary nodules, involvement of left kidney, and bowel. Disease was unresponsive to Rituxan in 2014.   Chest, abdomen, and pelvic CT scan on 03/17/2016 revealed progression of diffuse lymphoma with subcutaneous deposits throughout the subcutaneous fat of the chest, abdomen,and pelvis. There were multiple nodular infiltrates throughout the lungs, likely due to lymphoma but could also represent infection or atypical infection. There was massive retroperitoneal lymphadenopathy with diffuse  retroperitoneal, mesenteric, and pelvic lymphadenopathy. There was wall thickening and pelvicsmall bowel probably representing lymphomas involvement. There was probable direct invasion of the left kidney.  Biopsy of the dominant SQ nodal conglomeration around the right flank on 03/18/2016 revealed a persistent low-grade CD5 positive B-cell lymphoma.  Flow cytometry was positive for CD20, CD79a, CD5, CD138. Neoplastic cells were positive for kappa and cyclin D1.  CD 23 was negative.   Additional molecular markers are pending.  Bone marrow aspirate and biopsy on 03/18/2016 revealed no evidence of lymphoma or plasma cell neoplasm (0.2% kappa monoclonal plasma cells by flow analysis).  Marrow was hypercellular for age (50-60%) with trilineage hematopoiesis and no increased blasts. There was mild to moderate widespread increase in reticulin fibers. There was no storage iron detected.  The following studies were abnormal:  SPEP revealed a 5.0 gm/dl monoclonal spike.  IgG was 696, IgA 187, and IgM > 5850.  Beta2-microglobulin was 9.6 (0.6-2.4).  Serum viscosity was 5.5 (1.6-1.9).  Uric acid was 11.1 on 03/19/2016.     Normal studies included:  hepatitis B surface antigen, hepatitis B core antibody total, hepatitis C antibody, HIV testing, and LDH (123).  G6PD assay was 9.2 (4.6-13.5).   She has iron deficiency (ferritin 27; iron saturation 11%), B12 deficiency (147), and folate deficiency (5.9).  She has chronic renal insufficiency (BUN was 30-35, creatinine was 1.38 - 1.80 with a GFR 29-39 ml/min).    She has lost 27 pounds in the past 2 months. She denies any hyperviscosity symptoms.  She is on azithromycin for pneumonia.  Plan: 1.  Discuss bone marrow biopsy and SQ mass biopsy.  No evidence of lymphoma in marrow.  Mass appears consistent with her low grade lymphoma (lymphoplasmacytic lymphoma).  No current evidence of transformation.  Discuss final report will be back next week.  Discuss patient's  thoughts about treatment.  Discuss treatment with chlorambucil and prednisone then switch to ibrutinib when available.  Side effects reviewed.  Information provided.  Discuss high risk for tumor lysis.  Discuss checking tumor labs 2-3 times a week.  Discuss potential for dialysis.  Discuss initiating  allopurinol.  Discuss supportive care if patient does not wish to purse treatment.  2.  Discuss iron deficiency.  Discuss initiating oral iron 325 mg po q day with OJ or vitamin C.  Discuss advancing to TID if tolerated.  Discuss iron rich diet.  Discuss IV iron if unable to tolerate oral iron.  3.  Discuss B12 and folate deficiency.  Discuss beginning folic acid 1 mg a day.  Discuss B12 injection (weekly x 6 then monthly).  4.  B12 today and weekly x 5 then monthly. 5.  Rx:  allopurinol 200 mg po q day. 6.  Rx:  folic acid 1 mg a day. 7.  Rx:  ferrous sulfate 325 mg po q day (advace as tolerated). 8.  RTC on 03/28/2016 for MD assess, labs (CBC with diff, CMP, uric acid) and patient's thoughts about therapy.   Lequita Asal, MD  03/23/2016, 4:00 PM

## 2016-03-24 ENCOUNTER — Inpatient Hospital Stay: Payer: Commercial Managed Care - HMO

## 2016-03-24 ENCOUNTER — Telehealth: Payer: Self-pay | Admitting: Surgery

## 2016-03-24 NOTE — Telephone Encounter (Signed)
Patient has called and stated that she needs to cancel surgery. She seen Dr Mike Gip on 03/23/16 and she has decided to be treated with oral medication instead of IV treatment. I have canceled surgery and her office visit. Patient was advised to call [place] if she needs [place] in the future.   Dr Dahlia Byes and the OR @ [facility] has been advised.

## 2016-03-25 ENCOUNTER — Other Ambulatory Visit: Payer: Commercial Managed Care - HMO

## 2016-03-25 ENCOUNTER — Ambulatory Visit: Payer: Medicare HMO | Admitting: Surgery

## 2016-03-27 ENCOUNTER — Encounter: Payer: Self-pay | Admitting: Hematology and Oncology

## 2016-03-28 ENCOUNTER — Inpatient Hospital Stay: Payer: Medicare HMO

## 2016-03-28 ENCOUNTER — Encounter: Payer: Self-pay | Admitting: *Deleted

## 2016-03-28 ENCOUNTER — Inpatient Hospital Stay (HOSPITAL_BASED_OUTPATIENT_CLINIC_OR_DEPARTMENT_OTHER): Payer: Medicare HMO | Admitting: Hematology and Oncology

## 2016-03-28 ENCOUNTER — Ambulatory Visit: Payer: Medicare HMO

## 2016-03-28 ENCOUNTER — Encounter: Payer: Self-pay | Admitting: Hematology and Oncology

## 2016-03-28 VITALS — BP 164/68 | HR 62 | Temp 96.8°F | Resp 16 | Ht 63.0 in | Wt 169.9 lb

## 2016-03-28 DIAGNOSIS — Z79899 Other long term (current) drug therapy: Secondary | ICD-10-CM

## 2016-03-28 DIAGNOSIS — D509 Iron deficiency anemia, unspecified: Secondary | ICD-10-CM

## 2016-03-28 DIAGNOSIS — R0602 Shortness of breath: Secondary | ICD-10-CM | POA: Diagnosis not present

## 2016-03-28 DIAGNOSIS — C8303 Small cell B-cell lymphoma, intra-abdominal lymph nodes: Secondary | ICD-10-CM

## 2016-03-28 DIAGNOSIS — R918 Other nonspecific abnormal finding of lung field: Secondary | ICD-10-CM

## 2016-03-28 DIAGNOSIS — E785 Hyperlipidemia, unspecified: Secondary | ICD-10-CM

## 2016-03-28 DIAGNOSIS — I129 Hypertensive chronic kidney disease with stage 1 through stage 4 chronic kidney disease, or unspecified chronic kidney disease: Secondary | ICD-10-CM

## 2016-03-28 DIAGNOSIS — N189 Chronic kidney disease, unspecified: Secondary | ICD-10-CM

## 2016-03-28 DIAGNOSIS — E790 Hyperuricemia without signs of inflammatory arthritis and tophaceous disease: Secondary | ICD-10-CM

## 2016-03-28 DIAGNOSIS — R590 Localized enlarged lymph nodes: Secondary | ICD-10-CM | POA: Diagnosis not present

## 2016-03-28 DIAGNOSIS — E538 Deficiency of other specified B group vitamins: Secondary | ICD-10-CM

## 2016-03-28 DIAGNOSIS — C8300 Small cell B-cell lymphoma, unspecified site: Secondary | ICD-10-CM

## 2016-03-28 LAB — COMPREHENSIVE METABOLIC PANEL
ALT: 18 U/L (ref 14–54)
AST: 24 U/L (ref 15–41)
Albumin: 2.5 g/dL — ABNORMAL LOW (ref 3.5–5.0)
Alkaline Phosphatase: 45 U/L (ref 38–126)
Anion gap: 10 (ref 5–15)
BUN: 32 mg/dL — ABNORMAL HIGH (ref 6–20)
CO2: 27 mmol/L (ref 22–32)
Calcium: 10 mg/dL (ref 8.9–10.3)
Chloride: 99 mmol/L — ABNORMAL LOW (ref 101–111)
Creatinine, Ser: 1.44 mg/dL — ABNORMAL HIGH (ref 0.44–1.00)
GFR calc Af Amer: 37 mL/min — ABNORMAL LOW (ref >60)
GFR calc non Af Amer: 32 mL/min — ABNORMAL LOW (ref >60)
Glucose, Bld: 100 mg/dL — ABNORMAL HIGH (ref 65–99)
Potassium: 3.9 mmol/L (ref 3.5–5.1)
Sodium: 136 mmol/L (ref 135–145)
Total Bilirubin: 0.4 mg/dL (ref 0.3–1.2)
Total Protein: 11.4 g/dL — ABNORMAL HIGH (ref 6.5–8.1)

## 2016-03-28 LAB — CBC WITH DIFFERENTIAL/PLATELET
Basophils Absolute: 0 10*3/uL (ref 0–0.1)
Basophils Relative: 0 %
Eosinophils Absolute: 0.4 10*3/uL (ref 0–0.7)
Eosinophils Relative: 8 %
HCT: 30.7 % — ABNORMAL LOW (ref 35.0–47.0)
Hemoglobin: 10.1 g/dL — ABNORMAL LOW (ref 12.0–16.0)
Lymphocytes Relative: 42 %
Lymphs Abs: 2.4 10*3/uL (ref 1.0–3.6)
MCH: 22.7 pg — ABNORMAL LOW (ref 26.0–34.0)
MCHC: 32.9 g/dL (ref 32.0–36.0)
MCV: 69 fL — ABNORMAL LOW (ref 80.0–100.0)
Monocytes Absolute: 1 10*3/uL — ABNORMAL HIGH (ref 0.2–0.9)
Monocytes Relative: 17 %
Neutro Abs: 1.9 10*3/uL (ref 1.4–6.5)
Neutrophils Relative %: 33 %
Platelets: 90 10*3/uL — ABNORMAL LOW (ref 150–440)
RBC: 4.45 MIL/uL (ref 3.80–5.20)
RDW: 21.5 % — ABNORMAL HIGH (ref 11.5–14.5)
WBC: 5.7 10*3/uL (ref 3.6–11.0)

## 2016-03-28 LAB — URIC ACID: Uric Acid, Serum: 8.2 mg/dL — ABNORMAL HIGH (ref 2.3–6.6)

## 2016-03-28 MED ORDER — ONDANSETRON HCL 4 MG PO TABS: 4.0000 mg | ORAL_TABLET | Freq: Three times a day (TID) | ORAL | Status: DC | PRN

## 2016-03-28 MED ORDER — CYANOCOBALAMIN 1000 MCG/ML IJ SOLN
1000.0000 ug | Freq: Once | INTRAMUSCULAR | Status: AC
  Administered 2016-03-28: 1000 ug via INTRAMUSCULAR
  Filled 2016-03-28: qty 1

## 2016-03-28 MED ORDER — PREDNISONE 20 MG PO TABS: 40.0000 mg | ORAL_TABLET | Freq: Every day | ORAL | Status: DC

## 2016-03-28 MED ORDER — CHLORAMBUCIL 2 MG PO TABS: 6.0000 mg | ORAL_TABLET | Freq: Every day | ORAL | Status: DC

## 2016-03-28 NOTE — Progress Notes (Signed)
Pt reports no changes since last visit  

## 2016-03-28 NOTE — Progress Notes (Signed)
[facility] day:  03/28/2016  Chief Complaint: Kristy Solomon is a 82 y.o. female with lymphoplasmacytic lymphoma who is seen for 1 week assessment and finalization of treatment plan.  HPI: The patient was last seen in the medical oncology clinbic on 06/21/.2017.  At that time, she was seen for initial assessment after her hospitalization.  She was on azithromycin for pneumonia.  We discussed management of her B12, folate, and iron deficiency.  We discussed a treatment consideration of her lymphoplasmacytic lymphoma.  She was unsure if she wanted treatment given the potential side effects of treatment as well as the potential for tumor lysis syndrome, renal insufficiency and possible dialysis.  She was started on allopurinol.  During the interim, she notes that not much has changed. She completed her azithromycin.  She is taking one iron pill a day. She is interested in beginning treatment. She denies any fever sweats or weight loss.  She has  shortness of breath with activities of daily living.  She remans on 2 liters of oxygen.  She spends the majority of her day resting and sitting in a chair.  She denies any hypervisosity symptoms of nasal bleeding, blurry vision, headache, confusion, vertigo, dizziness, tinnitus, diplopia, or ataxia.    Past Medical History  Diagnosis Date  . Hypertension   . Hyperlipidemia   . Malignant lymphoma, lymphoplasmacytic ([place]) 12/27/2013  . 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 Renal insufficiency     Past Surgical History  Procedure Laterality Date  . Hemorrhoid surgery    . Abdominal hysterectomy      Family History  Problem Relation Age of Onset  . Cancer Brother     throat ca  . Cancer Brother     bone cancer    Social History:  reports that she has never smoked. She has never used smokeless tobacco. She reports that she does not drink alcohol or use illicit drugs.  She lives in [place] with  her grandson and his wife. Her emergency contact is Cecille Po, her grandaughter-:  [phone]).  She is accompanied by [name] [phone]).  Allergies: No Known Allergies  Current Medications: Current Outpatient Prescriptions  Medication Sig Dispense Refill  . allopurinol (ZYLOPRIM) 100 MG tablet Take 2 tablets (200 mg total) by mouth daily. 40 tablet 0  . aspirin EC 81 MG tablet Take by mouth.    Marland Kitchen atorvastatin (LIPITOR) 20 MG tablet Take 20 mg by mouth daily.    . colchicine 0.6 MG tablet Take 1 tablet (0.6 mg total) by mouth daily. Take 2 tablets at onset of pain, then may take 1 more 1 hour later if pain continues. 3 tablet 0  . ferrous sulfate 325 (65 FE) MG EC tablet Take 1 tablet by mouth a day with orange juice or vitamin C 30 tablet 1  . folic acid (FOLVITE) 1 MG tablet Take 1 tablet (1 mg total) by mouth daily. 30 tablet 1  . guaiFENesin (ROBITUSSIN) 100 MG/5ML SOLN Take 5 mLs (100 mg total) by mouth every 6 (six) hours as needed for cough or to loosen phlegm. 118 mL 0  . ibuprofen (ADVIL,MOTRIN) 600 MG tablet Take by mouth.    . indomethacin (INDOCIN) 50 MG capsule Take 1 capsule (50 mg total) by mouth 3 (three) times daily with meals as needed. (Patient taking differently: Take 50 mg by mouth 3 (three) times daily with meals as needed for mild pain. ) 21 capsule 0  .  lisinopril-hydrochlorothiazide (PRINZIDE,ZESTORETIC) 20-12.5 MG per tablet Take 1 tablet by mouth daily.    . metoprolol tartrate (LOPRESSOR) 25 MG tablet Take 25 mg by mouth 2 (two) times daily.  0  . nystatin cream (MYCOSTATIN) Apply topically. Reported on 03/23/2016    . omeprazole (PRILOSEC) 20 MG capsule Take 20 mg by mouth daily.  0  . polyethylene glycol (MIRALAX / GLYCOLAX) packet Take 17 g by mouth daily as needed for mild constipation.      No current facility-administered medications for this visit.   Facility-Administered Medications Ordered in Other Visits  Medication Dose Route Frequency Provider  Last Rate Last Dose  . cyanocobalamin ((VITAMIN B-12)) injection 1,000 mcg  1,000 mcg Intramuscular Once Lequita Asal, MD        Review of Systems:  GENERAL: Fatigue. No fevers. Some sweats. Weight loss of 27 pounds in 2 months (1 pound since last visit). PERFORMANCE STATUS (ECOG): 2 HEENT: No visual changes, runny nose, sore throat, mouth sores or tenderness. Lungs: Shortness of breath with ADLs. Needs to sit up. No hemoptysis. Cardiac: No chest pain, palpitations, orthopnea, or PND. GI:  Poor appetite. Abdominal discomfort. No nausea, vomiting, diarrhea, constipation, melena or hematochezia. GU: No urgency, frequency, dysuria, or hematuria. Musculoskeletal: No back pain. No joint pain. No muscle tenderness. Extremities: No pain or swelling. Skin: Nodules in skin associated with disease. No rashes. Neuro: No headache, numbness or weakness, balance or coordination issues. Endocrine: No diabetes, thyroid issues, hot flashes or night sweats. Psych: Poor sleep. No mood changes, depression or anxiety. Pain: Abdominal discomfort. Review of systems: All other systems reviewed and found to be negative.  Physical Exam: Blood pressure 164/68, pulse 62, temperature 96.8 F (36 C), temperature source Tympanic, resp. rate 16, height 5' 3"  (1.6 m), weight 169 lb 13.8 oz (77.05 kg), SpO2 96 %. GENERAL: Elderly woman sitting comfortably in a wheelchair in clinic in no acute distress. MENTAL STATUS: Alert and oriented to person, place and time. HEAD: Brown hair. Normocephalic, atraumatic, face symmetric, no Cushingoid features. EYES: Brown eyes. Pupils equal round and reactive to light and accomodation. No conjunctivitis or scleral icterus. ENT: De Motte in place.  Oropharynx clear without lesion. Tongue normal. Mucous membranes moist.  RESPIRATORY: Poor respiratory excursion. No wheezes or rhonchi. CARDIOVASCULAR: Regular rate and rhythm without murmur, rub or  gallop. ABDOMEN: Soft, non-tender with active bowel sounds and no appreciable hepatosplenomegaly. Large right sided abdominal fullness/mass.   SKIN: Extensive nodules in subcutaneous tissue (scalp, flank, back, paravertebral, abdominal wall, left inguinal region).  EXTREMITIES: No edema, no skin discoloration or tenderness. No palpable cords. LYMPH NODES: No palpable cervical, supraclavicular, axillary or inguinal adenopathy  NEUROLOGICAL: Unremarkable. PSYCH: Appropriate   Appointment on 03/28/2016  Component Date Value Ref Range Status  . WBC 03/28/2016 5.7  3.6 - 11.0 K/uL Final  . RBC 03/28/2016 4.45  3.80 - 5.20 MIL/uL Final  . Hemoglobin 03/28/2016 10.1* 12.0 - 16.0 g/dL Final  . HCT 03/28/2016 30.7* 35.0 - 47.0 % Final  . MCV 03/28/2016 69.0* 80.0 - 100.0 fL Final  . MCH 03/28/2016 22.7* 26.0 - 34.0 pg Final  . MCHC 03/28/2016 32.9  32.0 - 36.0 g/dL Final  . RDW 03/28/2016 21.5* 11.5 - 14.5 % Final  . Platelets 03/28/2016 90* 150 - 440 K/uL Final  . Neutrophils Relative % 03/28/2016 33   Final  . Neutro Abs 03/28/2016 1.9  1.4 - 6.5 K/uL Final  . Lymphocytes Relative 03/28/2016 42   Final  . Lymphs  Abs 03/28/2016 2.4  1.0 - 3.6 K/uL Final  . Monocytes Relative 03/28/2016 17   Final  . Monocytes Absolute 03/28/2016 1.0* 0.2 - 0.9 K/uL Final  . Eosinophils Relative 03/28/2016 8   Final  . Eosinophils Absolute 03/28/2016 0.4  0 - 0.7 K/uL Final  . Basophils Relative 03/28/2016 0   Final  . Basophils Absolute 03/28/2016 0.0  0 - 0.1 K/uL Final  . Sodium 03/28/2016 136  135 - 145 mmol/L Final  . Potassium 03/28/2016 3.9  3.5 - 5.1 mmol/L Final  . Chloride 03/28/2016 99* 101 - 111 mmol/L Final  . CO2 03/28/2016 27  22 - 32 mmol/L Final  . Glucose, Bld 03/28/2016 100* 65 - 99 mg/dL Final  . BUN 03/28/2016 32* 6 - 20 mg/dL Final  . Creatinine, Ser 03/28/2016 1.44* 0.44 - 1.00 mg/dL Final  . Calcium 03/28/2016 10.0  8.9 - 10.3 mg/dL Final  . Total Protein 03/28/2016 11.4*  6.5 - 8.1 g/dL Final  . Albumin 03/28/2016 2.5* 3.5 - 5.0 g/dL Final  . AST 03/28/2016 24  15 - 41 U/L Final  . ALT 03/28/2016 18  14 - 54 U/L Final  . Alkaline Phosphatase 03/28/2016 45  38 - 126 U/L Final  . Total Bilirubin 03/28/2016 0.4  0.3 - 1.2 mg/dL Final  . GFR calc non Af Amer 03/28/2016 32* >60 mL/min Final  . GFR calc Af Amer 03/28/2016 37* >60 mL/min Final   Comment: (NOTE) The eGFR has been calculated using the CKD EPI equation. This calculation has not been validated in all clinical situations. eGFR's persistently <60 mL/min signify possible Chronic Kidney Disease.   . Anion gap 03/28/2016 10  5 - 15 Final    Assessment:  Kristy Solomon is a 82 y.o. 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 She has massive subcutaneous deposits, pulmonary nodules, involvement of left kidney, and bowel. Disease was unresponsive to Rituxan in 2014.   Chest, abdomen, and pelvic CT scan on 03/17/2016 revealed progression of diffuse lymphoma with subcutaneous deposits throughout the subcutaneous fat of the chest, abdomen,and pelvis. There were multiple nodular infiltrates throughout the lungs, likely due to lymphoma but could also represent infection or atypical infection. There was massive retroperitoneal lymphadenopathy with diffuse retroperitoneal, mesenteric, and pelvic lymphadenopathy. There was wall thickening and pelvicsmall bowel probably representing lymphomas involvement. There was probable direct invasion of the left kidney.  Biopsy of the dominant SQ nodal conglomeration around the right flank on 03/18/2016 revealed a persistent low-grade CD5 positive B-cell lymphoma.  Flow cytometry was positive for CD20, CD79a, CD5, CD138. Neoplastic cells were positive for kappa and cyclin D1.  CD 23 was negative.   Additional molecular markers are pending.  Bone marrow aspirate and biopsy on 03/18/2016 revealed no evidence of lymphoma or plasma cell  neoplasm (0.2% kappa monoclonal plasma cells by flow analysis).  Marrow was hypercellular for age (50-60%) with trilineage hematopoiesis and no increased blasts. There was mild to moderate widespread increase in reticulin fibers. There was no storage iron detected.  The following studies were abnormal:  SPEP revealed a 5.0 gm/dl monoclonal spike.  IgG was 696, IgA 187, and IgM > 5850.  Beta2-microglobulin was 9.6 (0.6-2.4).  Serum viscosity was 5.5 (1.6-1.9).  Uric acid was 11.1 on 03/19/2016.     Normal studies included:  hepatitis B surface antigen, hepatitis B core antibody total, hepatitis C antibody, HIV testing, and LDH (123).  G6PD assay was 9.2 (4.6-13.5).   She has iron deficiency (ferritin  27; iron saturation 11%), B12 deficiency (147), and folate deficiency (5.9).  She has chronic renal insufficiency (BUN was 30-35, creatinine was 1.38 - 1.80 with a GFR 29-39 ml/min).    Symptomatically, she is fatigued.  She has lost 27 pounds in the past 2 months. She denies any hyperviscosity symptoms.  Uric acid is 8.2 (improved).  Plan: 1.  Labs today:  CBC with diff, CMP, uric acid. 2.  Discuss patient's thoughts about therapy.  She wishes to pursue therapy. We discussed initiation of prednisone and chlorambucil until she can be switched to Ibrutinib. Side effects were reviewed in detail.  Patient consented to treatment.  She will undergo chemotherapy teaching by nursing when pills arrive. 3.  Rx: chlorambucil 6 mg po q day. 4.  Rx: prednisone 40 mg po q day. 5.  Submit paperwork for Ibrutinib 420 mg po q day (to start following completion of chlorambucil). 6.  Continue allopurinol 200 mg a day. 7.  Continue ferrous sulfate 325 mg po q day with OJ or vitamin C. 8.  Continue folic acid 1 mg a day. 9.  B12 today and weekly x 4 then monthly.   10.  Check labs (BMP and uric acid) every MWF.  [place] [phone]) if labs issues. 11.  Complete FMLA paperwork for family. 12.  RTC on  04/04/2016 for MD assess, labs (CBC with diff, CMP, uric acid) + B12   Lequita Asal, MD  03/28/2016, 2:41 PM

## 2016-03-29 ENCOUNTER — Other Ambulatory Visit: Payer: Self-pay | Admitting: *Deleted

## 2016-03-29 ENCOUNTER — Telehealth: Payer: Self-pay | Admitting: *Deleted

## 2016-03-29 DIAGNOSIS — C8300 Small cell B-cell lymphoma, unspecified site: Secondary | ICD-10-CM

## 2016-03-29 NOTE — Telephone Encounter (Signed)
Called the patient to let her know that the leukeran that Dr. Lenoard Aden to her yest. About starting medication and possible side effects.  The pharmacy [facility] told me that it wen through and he ordered it and it will be in today or tom. And it will cost less than a dollar.  Patient will let her family know.

## 2016-03-30 ENCOUNTER — Encounter (INDEPENDENT_AMBULATORY_CARE_PROVIDER_SITE_OTHER): Payer: Self-pay

## 2016-03-30 ENCOUNTER — Inpatient Hospital Stay: Payer: Medicare HMO

## 2016-03-30 DIAGNOSIS — C8300 Small cell B-cell lymphoma, unspecified site: Secondary | ICD-10-CM

## 2016-03-30 DIAGNOSIS — C8303 Small cell B-cell lymphoma, intra-abdominal lymph nodes: Secondary | ICD-10-CM | POA: Diagnosis not present

## 2016-03-31 ENCOUNTER — Other Ambulatory Visit: Payer: Self-pay | Admitting: *Deleted

## 2016-03-31 LAB — CALCIUM, IONIZED: Calcium, Ionized, Serum: 5.5 mg/dL (ref 4.5–5.6)

## 2016-03-31 MED ORDER — IBRUTINIB 140 MG PO CAPS: 420.0000 mg | ORAL_CAPSULE | Freq: Every day | ORAL | Status: DC

## 2016-04-01 ENCOUNTER — Other Ambulatory Visit: Payer: Self-pay

## 2016-04-01 ENCOUNTER — Inpatient Hospital Stay: Payer: Medicare HMO

## 2016-04-01 ENCOUNTER — Encounter: Admission: RE | Payer: Self-pay | Source: Ambulatory Visit

## 2016-04-01 ENCOUNTER — Ambulatory Visit: Admission: RE | Admit: 2016-04-01 | Payer: Medicare HMO | Source: Ambulatory Visit | Admitting: Surgery

## 2016-04-01 DIAGNOSIS — E790 Hyperuricemia without signs of inflammatory arthritis and tophaceous disease: Secondary | ICD-10-CM

## 2016-04-01 DIAGNOSIS — C8300 Small cell B-cell lymphoma, unspecified site: Secondary | ICD-10-CM

## 2016-04-01 DIAGNOSIS — C8303 Small cell B-cell lymphoma, intra-abdominal lymph nodes: Secondary | ICD-10-CM | POA: Diagnosis not present

## 2016-04-01 LAB — URIC ACID: Uric Acid, Serum: 7.3 mg/dL — ABNORMAL HIGH (ref 2.3–6.6)

## 2016-04-01 LAB — BASIC METABOLIC PANEL
Anion gap: 8 (ref 5–15)
BUN: 45 mg/dL — ABNORMAL HIGH (ref 6–20)
CO2: 28 mmol/L (ref 22–32)
Calcium: 10.2 mg/dL (ref 8.9–10.3)
Chloride: 99 mmol/L — ABNORMAL LOW (ref 101–111)
Creatinine, Ser: 1.33 mg/dL — ABNORMAL HIGH (ref 0.44–1.00)
GFR calc Af Amer: 41 mL/min — ABNORMAL LOW (ref >60)
GFR calc non Af Amer: 36 mL/min — ABNORMAL LOW (ref >60)
Glucose, Bld: 98 mg/dL (ref 65–99)
Potassium: 3.6 mmol/L (ref 3.5–5.1)
Sodium: 135 mmol/L (ref 135–145)

## 2016-04-01 SURGERY — INSERTION, TUNNELED CENTRAL VENOUS DEVICE, WITH PORT
Anesthesia: Choice

## 2016-04-04 ENCOUNTER — Other Ambulatory Visit: Payer: Self-pay

## 2016-04-04 ENCOUNTER — Inpatient Hospital Stay: Payer: Medicare HMO

## 2016-04-04 ENCOUNTER — Inpatient Hospital Stay: Payer: Medicare HMO | Attending: Hematology and Oncology

## 2016-04-04 ENCOUNTER — Encounter: Payer: Self-pay | Admitting: Hematology and Oncology

## 2016-04-04 ENCOUNTER — Inpatient Hospital Stay (HOSPITAL_BASED_OUTPATIENT_CLINIC_OR_DEPARTMENT_OTHER): Payer: Medicare HMO | Admitting: Hematology and Oncology

## 2016-04-04 VITALS — BP 161/70 | HR 48 | Temp 97.7°F | Resp 18 | Wt 170.5 lb

## 2016-04-04 DIAGNOSIS — D6481 Anemia due to antineoplastic chemotherapy: Secondary | ICD-10-CM | POA: Insufficient documentation

## 2016-04-04 DIAGNOSIS — J90 Pleural effusion, not elsewhere classified: Secondary | ICD-10-CM | POA: Insufficient documentation

## 2016-04-04 DIAGNOSIS — E785 Hyperlipidemia, unspecified: Secondary | ICD-10-CM | POA: Insufficient documentation

## 2016-04-04 DIAGNOSIS — N1330 Unspecified hydronephrosis: Secondary | ICD-10-CM | POA: Diagnosis not present

## 2016-04-04 DIAGNOSIS — K5900 Constipation, unspecified: Secondary | ICD-10-CM | POA: Diagnosis not present

## 2016-04-04 DIAGNOSIS — C8303 Small cell B-cell lymphoma, intra-abdominal lymph nodes: Secondary | ICD-10-CM | POA: Diagnosis not present

## 2016-04-04 DIAGNOSIS — D509 Iron deficiency anemia, unspecified: Secondary | ICD-10-CM | POA: Diagnosis not present

## 2016-04-04 DIAGNOSIS — C8300 Small cell B-cell lymphoma, unspecified site: Secondary | ICD-10-CM

## 2016-04-04 DIAGNOSIS — R918 Other nonspecific abnormal finding of lung field: Secondary | ICD-10-CM | POA: Diagnosis not present

## 2016-04-04 DIAGNOSIS — Z7982 Long term (current) use of aspirin: Secondary | ICD-10-CM | POA: Insufficient documentation

## 2016-04-04 DIAGNOSIS — N189 Chronic kidney disease, unspecified: Secondary | ICD-10-CM | POA: Insufficient documentation

## 2016-04-04 DIAGNOSIS — D696 Thrombocytopenia, unspecified: Secondary | ICD-10-CM

## 2016-04-04 DIAGNOSIS — Z79899 Other long term (current) drug therapy: Secondary | ICD-10-CM | POA: Diagnosis not present

## 2016-04-04 DIAGNOSIS — E538 Deficiency of other specified B group vitamins: Secondary | ICD-10-CM | POA: Insufficient documentation

## 2016-04-04 DIAGNOSIS — I129 Hypertensive chronic kidney disease with stage 1 through stage 4 chronic kidney disease, or unspecified chronic kidney disease: Secondary | ICD-10-CM | POA: Insufficient documentation

## 2016-04-04 DIAGNOSIS — R590 Localized enlarged lymph nodes: Secondary | ICD-10-CM | POA: Diagnosis not present

## 2016-04-04 DIAGNOSIS — Z808 Family history of malignant neoplasm of other organs or systems: Secondary | ICD-10-CM | POA: Insufficient documentation

## 2016-04-04 DIAGNOSIS — E790 Hyperuricemia without signs of inflammatory arthritis and tophaceous disease: Secondary | ICD-10-CM

## 2016-04-04 LAB — CBC WITH DIFFERENTIAL/PLATELET
Basophils Absolute: 0 10*3/uL (ref 0–0.1)
Basophils Relative: 0 %
Eosinophils Absolute: 0.1 10*3/uL (ref 0–0.7)
Eosinophils Relative: 1 %
HCT: 29.6 % — ABNORMAL LOW (ref 35.0–47.0)
Hemoglobin: 9.9 g/dL — ABNORMAL LOW (ref 12.0–16.0)
Lymphocytes Relative: 26 %
Lymphs Abs: 1.8 10*3/uL (ref 1.0–3.6)
MCH: 23.2 pg — ABNORMAL LOW (ref 26.0–34.0)
MCHC: 33.5 g/dL (ref 32.0–36.0)
MCV: 69.1 fL — ABNORMAL LOW (ref 80.0–100.0)
Monocytes Absolute: 0.4 10*3/uL (ref 0.2–0.9)
Monocytes Relative: 6 %
Neutro Abs: 4.6 10*3/uL (ref 1.4–6.5)
Neutrophils Relative %: 67 %
Platelets: 93 10*3/uL — ABNORMAL LOW (ref 150–440)
RBC: 4.28 MIL/uL (ref 3.80–5.20)
RDW: 21.5 % — ABNORMAL HIGH (ref 11.5–14.5)
WBC: 6.9 10*3/uL (ref 3.6–11.0)

## 2016-04-04 LAB — COMPREHENSIVE METABOLIC PANEL
ALT: 16 U/L (ref 14–54)
AST: 19 U/L (ref 15–41)
Albumin: 2.5 g/dL — ABNORMAL LOW (ref 3.5–5.0)
Alkaline Phosphatase: 38 U/L (ref 38–126)
Anion gap: 9 (ref 5–15)
BUN: 42 mg/dL — ABNORMAL HIGH (ref 6–20)
CO2: 25 mmol/L (ref 22–32)
Calcium: 9.7 mg/dL (ref 8.9–10.3)
Chloride: 101 mmol/L (ref 101–111)
Creatinine, Ser: 1.47 mg/dL — ABNORMAL HIGH (ref 0.44–1.00)
GFR calc Af Amer: 37 mL/min — ABNORMAL LOW (ref >60)
GFR calc non Af Amer: 32 mL/min — ABNORMAL LOW (ref >60)
Glucose, Bld: 140 mg/dL — ABNORMAL HIGH (ref 65–99)
Potassium: 3.9 mmol/L (ref 3.5–5.1)
Sodium: 135 mmol/L (ref 135–145)
Total Bilirubin: 0.2 mg/dL — ABNORMAL LOW (ref 0.3–1.2)
Total Protein: 11.6 g/dL — ABNORMAL HIGH (ref 6.5–8.1)

## 2016-04-04 LAB — URIC ACID: Uric Acid, Serum: 7.4 mg/dL — ABNORMAL HIGH (ref 2.3–6.6)

## 2016-04-04 MED ORDER — POLYETHYLENE GLYCOL 3350 17 G PO PACK: 17.0000 g | PACK | Freq: Every day | ORAL | Status: DC | PRN

## 2016-04-04 MED ORDER — CYANOCOBALAMIN 1000 MCG/ML IJ SOLN
1000.0000 ug | Freq: Once | INTRAMUSCULAR | Status: AC
  Administered 2016-04-04: 1000 ug via INTRAMUSCULAR
  Filled 2016-04-04: qty 1

## 2016-04-04 NOTE — Progress Notes (Signed)
[facility] day:  04/04/2016  Chief Complaint: Kristy Solomon is a 82 y.o. female with lymphoplasmacytic lymphoma who is seen for assessment on day 6 of cycle #1 chlorambucil and prednisone.  HPI: The patient was last seen in the medical oncology clinbic on 03/28/2016.  At that time, she had decided to proceed with treatment.  Uric acid was 8.2 (improved on allopurinol).  She was on folic acid for folate deficiency.  She received week #2 B12.  She was on oral iron.  She began prednisone 40 mg a day after last visit.  She began chlorambucil 6 mg a day on 03/30/2016.  She states that she has 1 day left.  She feels better.  She describes more energy.  She notes that the masses in her skin are going away.  She complains of constipation.  She would like to know when she can get off her oxygen.  She denies any hypervisosity symptoms of nasal bleeding, blurry vision, headache, confusion, vertigo, dizziness, tinnitus, diplopia, or ataxia.    Past Medical History  Diagnosis Date  . Hypertension   . Hyperlipidemia   . Malignant lymphoma, lymphoplasmacytic ([place]) 12/27/2013  . 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 Renal insufficiency     Past Surgical History  Procedure Laterality Date  . Hemorrhoid surgery    . Abdominal hysterectomy      Family History  Problem Relation Age of Onset  . Cancer Brother     throat ca  . Cancer Brother     bone cancer    Social History:  reports that she has never smoked. She has never used smokeless tobacco. She reports that she does not drink alcohol or use illicit drugs.  She lives in [place] with her grandson and his wife. Her emergency contact is Cecille Po, her grandaughter-:  [phone]).  She is accompanied by [name] [phone]).  Allergies: No Known Allergies  Current Medications: Current Outpatient Prescriptions  Medication Sig Dispense Refill  . allopurinol (ZYLOPRIM) 100 MG tablet  Take 2 tablets (200 mg total) by mouth daily. 40 tablet 0  . aspirin EC 81 MG tablet Take by mouth.    Marland Kitchen atorvastatin (LIPITOR) 20 MG tablet Take 20 mg by mouth daily.    . chlorambucil (LEUKERAN) 2 MG tablet Take 3 tablets (6 mg total) by mouth daily. Give on an empty stomach 1 hour before or 2 hours after meals. 21 tablet 0  . colchicine 0.6 MG tablet Take 1 tablet (0.6 mg total) by mouth daily. Take 2 tablets at onset of pain, then may take 1 more 1 hour later if pain continues. 3 tablet 0  . ferrous sulfate 325 (65 FE) MG EC tablet Take 1 tablet by mouth a day with orange juice or vitamin C 30 tablet 1  . folic acid (FOLVITE) 1 MG tablet Take 1 tablet (1 mg total) by mouth daily. 30 tablet 1  . guaiFENesin (ROBITUSSIN) 100 MG/5ML SOLN Take 5 mLs (100 mg total) by mouth every 6 (six) hours as needed for cough or to loosen phlegm. 118 mL 0  . ibrutinib (IMBRUVICA) 140 MG capsul Take 3 capsules (420 mg total) by mouth daily. 90 capsule 0  . ibuprofen (ADVIL,MOTRIN) 600 MG tablet Take by mouth.    . indomethacin (INDOCIN) 50 MG capsule Take 1 capsule (50 mg total) by mouth 3 (three) times daily with meals as needed. (Patient taking differently: Take 50 mg by mouth 3 (three)  times daily with meals as needed for mild pain. ) 21 capsule 0  . lisinopril-hydrochlorothiazide (PRINZIDE,ZESTORETIC) 20-12.5 MG per tablet Take 1 tablet by mouth daily.    . metoprolol tartrate (LOPRESSOR) 25 MG tablet Take 25 mg by mouth 2 (two) times daily.  0  . nystatin cream (MYCOSTATIN) Apply topically. Reported on 03/23/2016    . omeprazole (PRILOSEC) 20 MG capsule Take 20 mg by mouth daily.  0  . ondansetron (ZOFRAN) 4 MG tablet Take 1 tablet (4 mg total) by mouth every 8 (eight) hours as needed for nausea or vomiting. 20 tablet 1  . polyethylene glycol (MIRALAX / GLYCOLAX) packet Take 17 g by mouth daily as needed for mild constipation.     . predniSONE (DELTASONE) 20 MG tablet Take 2 tablets (40 mg total) by mouth  daily with breakfast. 30 tablet 0   No current facility-administered medications for this visit.   Facility-Administered Medications Ordered in Other Visits  Medication Dose Route Frequency Provider Last Rate Last Dose  . cyanocobalamin ((VITAMIN B-12)) injection 1,000 mcg  1,000 mcg Intramuscular Once Lequita Asal, MD      . cyanocobalamin ((VITAMIN B-12)) injection 1,000 mcg  1,000 mcg Intramuscular Once Lequita Asal, MD        Review of Systems:  GENERAL: Feels much better. No fevers or sweats. Weight loss of 27 pounds in 2 months (weight up 1 pound since last visit). PERFORMANCE STATUS (ECOG): 2 HEENT: No visual changes, runny nose, sore throat, mouth sores or tenderness. Lungs: Shortness of breath with ADLs.No cough o wheezing.  No hemoptysis. Cardiac: No chest pain, palpitations, orthopnea, or PND. GI:  Appetite 75%. Constipation.  No nausea, vomiting, diarrhea, melena or hematochezia. GU: No urgency, frequency, dysuria, or hematuria. Musculoskeletal: No back pain. No joint pain. No muscle tenderness. Extremities: No pain or swelling. Skin: Nodules in skin associated with disease, fading. No rashes. Neuro: No headache, numbness or weakness, balance or coordination issues. Endocrine: No diabetes, thyroid issues, hot flashes or night sweats. Psych: Poor sleep. No mood changes, depression or anxiety. Pain: Abdominal discomfort. Review of systems: All other systems reviewed and found to be negative.  Physical Exam: Blood pressure 161/70, pulse 48, temperature 97.7 F (36.5 C), temperature source Tympanic, resp. rate 18, weight 170 lb 8.4 oz (77.35 kg). GENERAL: Elderly woman sitting comfortably in the exam room in clinic in no acute distress.  She gets onto the table easily. MENTAL STATUS: Alert and oriented to person, place and time. HEAD: Brown hair. Normocephalic, atraumatic, face symmetric, no Cushingoid features. EYES: Brown eyes. Pupils  equal round and reactive to light and accomodation. No conjunctivitis or scleral icterus. ENT: Oropharynx clear without lesion. Tongue normal. Mucous membranes moist.  RESPIRATORY: Normal respiratory excursion. No rales, wheezes or rhonchi. CARDIOVASCULAR: Regular rate and rhythm without murmur, rub or gallop. ABDOMEN: Soft, non-tender with active bowel sounds and no appreciable hepatosplenomegaly.   SKIN: Extensive nodules in subcutaneous tissue (scalp, flank, back, paravertebral, abdominal wall, left inguinal region).  Many nodules have decreased in size (scalp 4 cm; multiple nodules on back 3-4 cm; largest mass 7 cm). EXTREMITIES: No edema, no skin discoloration or tenderness. No palpable cords. LYMPH NODES: No palpable cervical, supraclavicular, axillary or inguinal adenopathy  NEUROLOGICAL: Unremarkable. PSYCH: Appropriate   Appointment on 04/04/2016  Component Date Value Ref Range Status  . WBC 04/04/2016 6.9  3.6 - 11.0 K/uL Final  . RBC 04/04/2016 4.28  3.80 - 5.20 MIL/uL Final  . Hemoglobin 04/04/2016 9.9*  12.0 - 16.0 g/dL Final  . HCT 04/04/2016 29.6* 35.0 - 47.0 % Final  . MCV 04/04/2016 69.1* 80.0 - 100.0 fL Final  . MCH 04/04/2016 23.2* 26.0 - 34.0 pg Final  . MCHC 04/04/2016 33.5  32.0 - 36.0 g/dL Final  . RDW 04/04/2016 21.5* 11.5 - 14.5 % Final  . Platelets 04/04/2016 93* 150 - 440 K/uL Final  . Neutrophils Relative % 04/04/2016 67   Final  . Neutro Abs 04/04/2016 4.6  1.4 - 6.5 K/uL Final  . Lymphocytes Relative 04/04/2016 26   Final  . Lymphs Abs 04/04/2016 1.8  1.0 - 3.6 K/uL Final  . Monocytes Relative 04/04/2016 6   Final  . Monocytes Absolute 04/04/2016 0.4  0.2 - 0.9 K/uL Final  . Eosinophils Relative 04/04/2016 1   Final  . Eosinophils Absolute 04/04/2016 0.1  0 - 0.7 K/uL Final  . Basophils Relative 04/04/2016 0   Final  . Basophils Absolute 04/04/2016 0.0  0 - 0.1 K/uL Final  . Sodium 04/04/2016 135  135 - 145 mmol/L Final  . Potassium  04/04/2016 3.9  3.5 - 5.1 mmol/L Final  . Chloride 04/04/2016 101  101 - 111 mmol/L Final  . CO2 04/04/2016 25  22 - 32 mmol/L Final  . Glucose, Bld 04/04/2016 140* 65 - 99 mg/dL Final  . BUN 04/04/2016 42* 6 - 20 mg/dL Final  . Creatinine, Ser 04/04/2016 1.47* 0.44 - 1.00 mg/dL Final  . Calcium 04/04/2016 9.7  8.9 - 10.3 mg/dL Final  . Total Protein 04/04/2016 11.6* 6.5 - 8.1 g/dL Final  . Albumin 04/04/2016 2.5* 3.5 - 5.0 g/dL Final  . AST 04/04/2016 19  15 - 41 U/L Final  . ALT 04/04/2016 16  14 - 54 U/L Final  . Alkaline Phosphatase 04/04/2016 38  38 - 126 U/L Final  . Total Bilirubin 04/04/2016 0.2* 0.3 - 1.2 mg/dL Final  . GFR calc non Af Amer 04/04/2016 32* >60 mL/min Final  . GFR calc Af Amer 04/04/2016 37* >60 mL/min Final   Comment: (NOTE) The eGFR has been calculated using the CKD EPI equation. This calculation has not been validated in all clinical situations. eGFR's persistently <60 mL/min signify possible Chronic Kidney Disease.   . Anion gap 04/04/2016 9  5 - 15 Final  . Uric Acid, Serum 04/04/2016 7.4* 2.3 - 6.6 mg/dL Final    Assessment:  Kristy Solomon is a 82 y.o. 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 She has massive subcutaneous deposits, pulmonary nodules, involvement of left kidney, and bowel. Disease was unresponsive to Rituxan in 2014.   Chest, abdomen, and pelvic CT scan on 03/17/2016 revealed progression of diffuse lymphoma with subcutaneous deposits throughout the subcutaneous fat of the chest, abdomen,and pelvis. There were multiple nodular infiltrates throughout the lungs, likely due to lymphoma but could also represent infection or atypical infection. There was massive retroperitoneal lymphadenopathy with diffuse retroperitoneal, mesenteric, and pelvic lymphadenopathy. There was wall thickening and pelvicsmall bowel probably representing lymphomas involvement. There was probable direct invasion of the left  kidney.  Biopsy of the dominant SQ nodal conglomeration around the right flank on 03/18/2016 revealed a persistent low-grade CD5 positive B-cell lymphoma.  Flow cytometry was positive for CD20, CD79a, CD5, CD138. Neoplastic cells were positive for kappa and cyclin D1.  CD 23 was negative.   Additional molecular markers are pending.  Bone marrow aspirate and biopsy on 03/18/2016 revealed no evidence of lymphoma or plasma cell neoplasm (0.2% kappa monoclonal plasma cells  by flow analysis).  Marrow was hypercellular for age (50-60%) with trilineage hematopoiesis and no increased blasts. There was mild to moderate widespread increase in reticulin fibers. There was no storage iron detected.  The following studies were abnormal:  SPEP revealed a 5.0 gm/dl monoclonal spike.  IgG was 696, IgA 187, and IgM > 5850.  Beta2-microglobulin was 9.6 (0.6-2.4).  Serum viscosity was 5.5 (1.6-1.9).  Uric acid was 11.1 on 03/19/2016.     Normal studies included:  hepatitis B surface antigen, hepatitis B core antibody total, hepatitis C antibody, HIV testing, and LDH (123).  G6PD assay was 9.2 (4.6-13.5).   She has iron deficiency (ferritin 27; iron saturation 11%), B12 deficiency (147), and folate deficiency (5.9).  She has chronic renal insufficiency (BUN was 30-35, creatinine was 1.38 - 1.80 with a GFR 29-39 ml/min). She receives B12 weekly x 6 then monthly (last 03/28/2016).   She is day 6 of cycle #1 chlorambucil and prednisone.  She has stable thrombocytopenia (93,000).  Symptomatically, her energy level is good. SQ nodules are resolving.  She has no evidence of tumor lysis syndrome.  She denies any hyperviscosity symptoms.  She has constipation caused by oral iron.  Uric acid is 7.4 (improved).  Plan: 1.  Labs today:  CBC with diff, CMP, uric acid. 2.  Continue chlorambucil 6 mg po q day (last dose tomorrow). 3.  Continue prednisone 40 mg a day.  Decrease to 20 mg a day on 04/09/2016. 4.  Continue  allopurinol. 5.  Continue folic acid. 6.  B12 today and weekly x 3 then monthly. 7.  Continue ferrous sulfate.  If unable to tolerate, discuss IV iron. 8.  Discuss management of constipation. 9.  Rx:  Miralax. 10.  Follow-up status of ibrutinib. 11.  FMLA papers for family members. 12.  Decrease frequency of lab draws to twice weekly (next 04/08/2016). 13.  RTC on 04/08/2016 for labs (CBC with diff, BMP, uric acid). 14.  RTC on 04/11/2016 for MD assess, labs (CBC with diff, CMP, uric acid), and B12   Lequita Asal, MD  04/04/2016, 4:24 PM

## 2016-04-04 NOTE — Progress Notes (Signed)
Patient is here for a follow up, she would like to know when she can get off of the oxygen. Patient would like to know when she will be coming in for blood work the rest of this week. She came several times last week but is only here once this week. Also her granddaughter and grandson would like [place] to fill out FMLA papers. They are also caretakers for patient as well.

## 2016-04-07 ENCOUNTER — Encounter: Payer: Self-pay | Admitting: Pathology

## 2016-04-07 LAB — SURGICAL PATHOLOGY

## 2016-04-08 ENCOUNTER — Telehealth: Payer: Self-pay

## 2016-04-08 ENCOUNTER — Inpatient Hospital Stay: Payer: Medicare HMO

## 2016-04-08 ENCOUNTER — Other Ambulatory Visit: Payer: Self-pay

## 2016-04-08 DIAGNOSIS — N179 Acute kidney failure, unspecified: Secondary | ICD-10-CM

## 2016-04-08 DIAGNOSIS — C8300 Small cell B-cell lymphoma, unspecified site: Secondary | ICD-10-CM

## 2016-04-08 DIAGNOSIS — E790 Hyperuricemia without signs of inflammatory arthritis and tophaceous disease: Secondary | ICD-10-CM

## 2016-04-08 DIAGNOSIS — C8350 Lymphoblastic (diffuse) lymphoma, unspecified site: Secondary | ICD-10-CM

## 2016-04-08 DIAGNOSIS — C8303 Small cell B-cell lymphoma, intra-abdominal lymph nodes: Secondary | ICD-10-CM | POA: Diagnosis not present

## 2016-04-08 LAB — CBC WITH DIFFERENTIAL/PLATELET
Basophils Absolute: 0 10*3/uL (ref 0–0.1)
Basophils Relative: 0 %
Eosinophils Absolute: 0.1 10*3/uL (ref 0–0.7)
Eosinophils Relative: 1 %
HCT: 28.9 % — ABNORMAL LOW (ref 35.0–47.0)
Hemoglobin: 9.7 g/dL — ABNORMAL LOW (ref 12.0–16.0)
Lymphocytes Relative: 26 %
Lymphs Abs: 1.5 10*3/uL (ref 1.0–3.6)
MCH: 23.2 pg — ABNORMAL LOW (ref 26.0–34.0)
MCHC: 33.5 g/dL (ref 32.0–36.0)
MCV: 69.4 fL — ABNORMAL LOW (ref 80.0–100.0)
Monocytes Absolute: 0.7 10*3/uL (ref 0.2–0.9)
Monocytes Relative: 12 %
Neutro Abs: 3.6 10*3/uL (ref 1.4–6.5)
Neutrophils Relative %: 61 %
Platelets: 84 10*3/uL — ABNORMAL LOW (ref 150–440)
RBC: 4.16 MIL/uL (ref 3.80–5.20)
RDW: 21.8 % — ABNORMAL HIGH (ref 11.5–14.5)
WBC: 5.9 10*3/uL (ref 3.6–11.0)

## 2016-04-08 LAB — URIC ACID: Uric Acid, Serum: 7.9 mg/dL — ABNORMAL HIGH (ref 2.3–6.6)

## 2016-04-08 LAB — BASIC METABOLIC PANEL
Anion gap: 8 (ref 5–15)
BUN: 42 mg/dL — ABNORMAL HIGH (ref 6–20)
CO2: 25 mmol/L (ref 22–32)
Calcium: 9.6 mg/dL (ref 8.9–10.3)
Chloride: 100 mmol/L — ABNORMAL LOW (ref 101–111)
Creatinine, Ser: 1.37 mg/dL — ABNORMAL HIGH (ref 0.44–1.00)
GFR calc Af Amer: 40 mL/min — ABNORMAL LOW (ref >60)
GFR calc non Af Amer: 34 mL/min — ABNORMAL LOW (ref >60)
Glucose, Bld: 101 mg/dL — ABNORMAL HIGH (ref 65–99)
Potassium: 3.6 mmol/L (ref 3.5–5.1)
Sodium: 133 mmol/L — ABNORMAL LOW (ref 135–145)

## 2016-04-08 NOTE — Telephone Encounter (Signed)
Erroneous note encounter  

## 2016-04-08 NOTE — Telephone Encounter (Signed)
Called pt and confirmed with pt that she is taking her allopurinol and she stated that she is.  I asked pt that per md to please drink plenty of fluids and pt verbalized an understanding.  No other concerns noted

## 2016-04-11 ENCOUNTER — Inpatient Hospital Stay: Payer: Medicare HMO

## 2016-04-11 ENCOUNTER — Other Ambulatory Visit: Payer: Self-pay | Admitting: Hematology and Oncology

## 2016-04-11 ENCOUNTER — Encounter: Payer: Self-pay | Admitting: Hematology and Oncology

## 2016-04-11 ENCOUNTER — Inpatient Hospital Stay (HOSPITAL_BASED_OUTPATIENT_CLINIC_OR_DEPARTMENT_OTHER): Payer: Medicare HMO | Admitting: Hematology and Oncology

## 2016-04-11 VITALS — BP 148/65 | HR 59 | Temp 97.9°F | Resp 18 | Wt 172.5 lb

## 2016-04-11 DIAGNOSIS — C8303 Small cell B-cell lymphoma, intra-abdominal lymph nodes: Secondary | ICD-10-CM

## 2016-04-11 DIAGNOSIS — I129 Hypertensive chronic kidney disease with stage 1 through stage 4 chronic kidney disease, or unspecified chronic kidney disease: Secondary | ICD-10-CM

## 2016-04-11 DIAGNOSIS — Z79899 Other long term (current) drug therapy: Secondary | ICD-10-CM

## 2016-04-11 DIAGNOSIS — C8300 Small cell B-cell lymphoma, unspecified site: Secondary | ICD-10-CM

## 2016-04-11 DIAGNOSIS — D696 Thrombocytopenia, unspecified: Secondary | ICD-10-CM

## 2016-04-11 DIAGNOSIS — E538 Deficiency of other specified B group vitamins: Secondary | ICD-10-CM

## 2016-04-11 DIAGNOSIS — D509 Iron deficiency anemia, unspecified: Secondary | ICD-10-CM

## 2016-04-11 DIAGNOSIS — K5900 Constipation, unspecified: Secondary | ICD-10-CM

## 2016-04-11 DIAGNOSIS — D6481 Anemia due to antineoplastic chemotherapy: Secondary | ICD-10-CM

## 2016-04-11 DIAGNOSIS — E790 Hyperuricemia without signs of inflammatory arthritis and tophaceous disease: Secondary | ICD-10-CM

## 2016-04-11 DIAGNOSIS — N189 Chronic kidney disease, unspecified: Secondary | ICD-10-CM

## 2016-04-11 DIAGNOSIS — Z7982 Long term (current) use of aspirin: Secondary | ICD-10-CM

## 2016-04-11 DIAGNOSIS — E785 Hyperlipidemia, unspecified: Secondary | ICD-10-CM

## 2016-04-11 LAB — CBC WITH DIFFERENTIAL/PLATELET
Basophils Absolute: 0.1 10*3/uL (ref 0–0.1)
Basophils Relative: 3 %
Eosinophils Absolute: 0.2 10*3/uL (ref 0–0.7)
Eosinophils Relative: 4 %
HCT: 30.5 % — ABNORMAL LOW (ref 35.0–47.0)
Hemoglobin: 10 g/dL — ABNORMAL LOW (ref 12.0–16.0)
Lymphocytes Relative: 34 %
Lymphs Abs: 1.5 10*3/uL (ref 1.0–3.6)
MCH: 23 pg — ABNORMAL LOW (ref 26.0–34.0)
MCHC: 32.8 g/dL (ref 32.0–36.0)
MCV: 70.2 fL — ABNORMAL LOW (ref 80.0–100.0)
Monocytes Absolute: 0.5 10*3/uL (ref 0.2–0.9)
Monocytes Relative: 11 %
Neutro Abs: 2.1 10*3/uL (ref 1.4–6.5)
Neutrophils Relative %: 48 %
Platelets: 73 10*3/uL — ABNORMAL LOW (ref 150–400)
RBC: 4.34 MIL/uL (ref 3.80–5.20)
RDW: 22.7 % — ABNORMAL HIGH (ref 11.5–14.5)
WBC: 4.4 10*3/uL (ref 3.6–11.0)

## 2016-04-11 LAB — COMPREHENSIVE METABOLIC PANEL
ALT: 22 U/L (ref 14–54)
AST: 15 U/L (ref 15–41)
Albumin: 2.2 g/dL — ABNORMAL LOW (ref 3.5–5.0)
Alkaline Phosphatase: 34 U/L — ABNORMAL LOW (ref 38–126)
Anion gap: 8 (ref 5–15)
BUN: 40 mg/dL — ABNORMAL HIGH (ref 6–20)
CO2: 25 mmol/L (ref 22–32)
Calcium: 9.1 mg/dL (ref 8.9–10.3)
Chloride: 101 mmol/L (ref 101–111)
Creatinine, Ser: 1.36 mg/dL — ABNORMAL HIGH (ref 0.44–1.00)
GFR calc Af Amer: 40 mL/min — ABNORMAL LOW (ref >60)
GFR calc non Af Amer: 35 mL/min — ABNORMAL LOW (ref >60)
Glucose, Bld: 93 mg/dL (ref 65–99)
Potassium: 3.6 mmol/L (ref 3.5–5.1)
Sodium: 134 mmol/L — ABNORMAL LOW (ref 135–145)
Total Bilirubin: 0.5 mg/dL (ref 0.3–1.2)
Total Protein: 10.5 g/dL — ABNORMAL HIGH (ref 6.5–8.1)

## 2016-04-11 LAB — URIC ACID: Uric Acid, Serum: 7.4 mg/dL — ABNORMAL HIGH (ref 2.3–6.6)

## 2016-04-11 MED ORDER — CYANOCOBALAMIN 1000 MCG/ML IJ SOLN
1000.0000 ug | Freq: Once | INTRAMUSCULAR | Status: AC
  Administered 2016-04-11: 1000 ug via INTRAMUSCULAR
  Filled 2016-04-11: qty 1

## 2016-04-11 NOTE — Progress Notes (Signed)
Offers no complaints. States is feeling well. 

## 2016-04-11 NOTE — Progress Notes (Signed)
[facility] day:  04/11/2016  Chief Complaint: Kristy Solomon is a 82 y.o. female with lymphoplasmacytic lymphoma who is seen for assessment on day 13 of cycle #1 chlorambucil and prednisone.  HPI: The patient was last seen in the medical oncology clinic on 04/04/2016.  At that time, she felt better.  SQ nodules were resolving.  She had no evidence of tumor lysis syndrome.  Uric acid was 7.4 (improved).  She completed her last dose of chlorambucil on 04/05/2016.  She was on prednisone 40 mg a day.  Dose was decreased to 20 mg on 04/09/2016.  During the interim, she noets that her "lumps are diminishing".  She is not using her oxygen.  She asked that it be picked up from her house.  She denies any B symptoms.  She denies any hypervisosity symptoms (nasal bleeding, blurry vision, headache, confusion, vertigo, dizziness, tinnitus, diplopia, or ataxia).   She is voiding well.  She remains on allopurinol.   Past Medical History  Diagnosis Date  . Hypertension   . Hyperlipidemia   . Malignant lymphoma, lymphoplasmacytic ([place]) 12/27/2013  . 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 Renal insufficiency     Past Surgical History  Procedure Laterality Date  . Hemorrhoid surgery    . Abdominal hysterectomy      Family History  Problem Relation Age of Onset  . Cancer Brother     throat ca  . Cancer Brother     bone cancer    Social History:  reports that she has never smoked. She has never used smokeless tobacco. She reports that she does not drink alcohol or use illicit drugs.  She lives in [place] with her grandson and his wife. Her emergency contact is Kristy Solomon, her grandaughter- [phone]).  Kristy Solomon's phone number [phone]).  She is accompanied by her grand-daughter, Kristy Solomon.  Allergies: No Known Allergies  Current Medications: Current Outpatient Prescriptions  Medication Sig Dispense Refill  . allopurinol (ZYLOPRIM) 100 MG  tablet Take 2 tablets (200 mg total) by mouth daily. 40 tablet 0  . atorvastatin (LIPITOR) 20 MG tablet Take 20 mg by mouth daily.    . ferrous sulfate 325 (65 FE) MG EC tablet Take 1 tablet by mouth a day with orange juice or vitamin C 30 tablet 1  . folic acid (FOLVITE) 1 MG tablet Take 1 tablet (1 mg total) by mouth daily. 30 tablet 1  . ibuprofen (ADVIL,MOTRIN) 600 MG tablet Take 600 mg by mouth every 6 (six) hours as needed.     Marland Kitchen lisinopril-hydrochlorothiazide (PRINZIDE,ZESTORETIC) 20-12.5 MG per tablet Take 1 tablet by mouth daily.    . metoprolol tartrate (LOPRESSOR) 25 MG tablet Take 25 mg by mouth 2 (two) times daily.  0  . omeprazole (PRILOSEC) 20 MG capsule Take 20 mg by mouth daily.  0  . ondansetron (ZOFRAN) 4 MG tablet Take 1 tablet (4 mg total) by mouth every 8 (eight) hours as needed for nausea or vomiting. 20 tablet 1  . polyethylene glycol (MIRALAX / GLYCOLAX) packet Take 17 g by mouth daily as needed for mild constipation. 14 each 1  . predniSONE (DELTASONE) 20 MG tablet Take 2 tablets (40 mg total) by mouth daily with breakfast. (Patient taking differently: Take 20 mg by mouth daily with breakfast. ) 30 tablet 0  . chlorambucil (LEUKERAN) 2 MG tablet Take 3 tablets (6 mg total) by mouth daily. Give on an empty stomach 1 hour before or  2 hours after meals. (Patient not taking: Reported on 04/11/2016) 21 tablet 0  . IMBRUVICA 140 MG capsul Take 3 capsules by mouth daily.     No current facility-administered medications for this visit.   Facility-Administered Medications Ordered in Other Visits  Medication Dose Route Frequency Provider Last Rate Last Dose  . cyanocobalamin ((VITAMIN B-12)) injection 1,000 mcg  1,000 mcg Intramuscular Once Lequita Asal, MD        Review of Systems:  GENERAL: Feels good. No fevers or sweats. Weight loss of 27 pounds in 2 months (weight up 2 pounds since last visit). PERFORMANCE STATUS (ECOG): 2 HEENT: No visual changes, runny nose,  sore throat, mouth sores or tenderness. Lungs:  Breathing better.  Doe snot need oxygen.No cough or wheezing.  No hemoptysis. Cardiac: No chest pain, palpitations, orthopnea, or PND. GI:  Appetite 100%.No nausea, vomiting, diarrhea, constipation, melena or hematochezia. GU: No urgency, frequency, dysuria, or hematuria. Musculoskeletal: No back pain. No joint pain. No muscle tenderness. Extremities: No pain or swelling. Skin: Nodules in skin associated with disease, diminishing. No rashes. Neuro: No headache, numbness or weakness, balance or coordination issues. Endocrine: No diabetes, thyroid issues, hot flashes or night sweats. Psych:No mood changes, depression or anxiety. Pain: No pain. Review of systems: All other systems reviewed and found to be negative.  Physical Exam: Blood pressure 148/65, pulse 59, temperature 97.9 F (36.6 C), temperature source Tympanic, resp. rate 18, weight 172 lb 8.2 oz (78.25 kg). GENERAL: Elderly woman sitting comfortably in the exam room in clinic in no acute distress.  She gets onto the table easily. MENTAL STATUS: Alert and oriented to person, place and time. HEAD: Brown hair. Normocephalic, atraumatic, face symmetric, no Cushingoid features. EYES: Brown eyes. Pupils equal round and reactive to light and accomodation. No conjunctivitis or scleral icterus. ENT: Oropharynx clear without lesion. Tongue normal. Mucous membranes moist.  RESPIRATORY: Normal respiratory excursion. No rales, wheezes or rhonchi. CARDIOVASCULAR: Regular rate and rhythm without murmur, rub or gallop. ABDOMEN: Soft, non-tender with active bowel sounds and no appreciable hepatosplenomegaly.   SKIN: Extensive nodules in subcutaneous tissue (scalp, flank, back, paravertebral, abdominal wall, left inguinal region).  Many nodules have decreased in size (scalp 3 cm; multiple nodules on back 2-3 cm; largest mass 2 x 6 cm). EXTREMITIES: No edema, no skin  discoloration or tenderness. No palpable cords. LYMPH NODES: No palpable cervical, supraclavicular, axillary or inguinal adenopathy  NEUROLOGICAL: Unremarkable. PSYCH: Appropriate   Appointment on 04/11/2016  Component Date Value Ref Range Status  . Sodium 04/11/2016 134* 135 - 145 mmol/L Final  . Potassium 04/11/2016 3.6  3.5 - 5.1 mmol/L Final  . Chloride 04/11/2016 101  101 - 111 mmol/L Final  . CO2 04/11/2016 25  22 - 32 mmol/L Final  . Glucose, Bld 04/11/2016 93  65 - 99 mg/dL Final  . BUN 04/11/2016 40* 6 - 20 mg/dL Final  . Creatinine, Ser 04/11/2016 1.36* 0.44 - 1.00 mg/dL Final  . Calcium 04/11/2016 9.1  8.9 - 10.3 mg/dL Final  . Total Protein 04/11/2016 10.5* 6.5 - 8.1 g/dL Final  . Albumin 04/11/2016 2.2* 3.5 - 5.0 g/dL Final  . AST 04/11/2016 15  15 - 41 U/L Final  . ALT 04/11/2016 22  14 - 54 U/L Final  . Alkaline Phosphatase 04/11/2016 34* 38 - 126 U/L Final  . Total Bilirubin 04/11/2016 0.5  0.3 - 1.2 mg/dL Final  . GFR calc non Af Amer 04/11/2016 35* >60 mL/min Final  . GFR  calc Af Amer 04/11/2016 40* >60 mL/min Final   Comment: (NOTE) The eGFR has been calculated using the CKD EPI equation. This calculation has not been validated in all clinical situations. eGFR's persistently <60 mL/min signify possible Chronic Kidney Disease.   . Anion gap 04/11/2016 8  5 - 15 Final    Assessment:  AERIELLE STOKLOSA is a 82 y.o. 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 She has massive subcutaneous deposits, pulmonary nodules, involvement of left kidney, and bowel. Disease was unresponsive to Rituxan in 2014.   Chest, abdomen, and pelvic CT scan on 03/17/2016 revealed progression of diffuse lymphoma with subcutaneous deposits throughout the subcutaneous fat of the chest, abdomen,and pelvis. There were multiple nodular infiltrates throughout the lungs, likely due to lymphoma but could also represent infection or atypical infection.  There was massive retroperitoneal lymphadenopathy with diffuse retroperitoneal, mesenteric, and pelvic lymphadenopathy. There was wall thickening and pelvicsmall bowel probably representing lymphomas involvement. There was probable direct invasion of the left kidney.  Biopsy of the dominant SQ nodal conglomeration around the right flank on 03/18/2016 revealed a persistent low-grade CD5 positive B-cell lymphoma.  Flow cytometry was positive for CD20, CD79a, CD5, CD138. Neoplastic cells were positive for kappa and cyclin D1.  CD 23 was negative.   Additional molecular markers are pending.  Bone marrow aspirate and biopsy on 03/18/2016 revealed no evidence of lymphoma or plasma cell neoplasm (0.2% kappa monoclonal plasma cells by flow analysis).  Marrow was hypercellular for age (50-60%) with trilineage hematopoiesis and no increased blasts. There was mild to moderate widespread increase in reticulin fibers. There was no storage iron detected.  The following studies were abnormal:  SPEP revealed a 5.0 gm/dl monoclonal spike.  IgG was 696, IgA 187, and IgM > 5850.  Beta2-microglobulin was 9.6 (0.6-2.4).  Serum viscosity was 5.5 (1.6-1.9).  Uric acid was 11.1 on 03/19/2016.     Normal studies included:  hepatitis B surface antigen, hepatitis B core antibody total, hepatitis C antibody, HIV testing, and LDH (123).  G6PD assay was 9.2 (4.6-13.5).   She has iron deficiency (ferritin 27; iron saturation 11%), B12 deficiency (147), and folate deficiency (5.9).  She has chronic renal insufficiency (BUN was 30-35, creatinine was 1.38 - 1.80 with a GFR 29-39 ml/min). She receives B12 weekly x 6 then monthly (last 04/04/2016).  She is on folic acid.   She is day 13 of cycle #1 chlorambucil and prednisone (began 03/30/2016).  She has mild thrombocytopenia (73,000).  Symptomatically, her energy level is good. SQ nodules are resolving.  She has no evidence of tumor lysis syndrome.  She denies any hyperviscosity  symptoms.  Uric acid is 7.4 (stable).  Plan: 1.  Labs today:  CBC with diff, CMP, uric acid. 2.  Continue prednisone 20 mg a day.  Decrease to 10 mg a day on 04/16/2016. 3.  Continue allopurinol. 4.  Continue folic acid. 5.  B12 today and weekly x 2 then monthly. 6.  Continue ferrous sulfate.  If unable to tolerate, discuss IV iron. 7.  Follow-up status of ibrutinib. 8.  RTC on 04/14/2016 for labs (CBC with diff, BMP, uric acid). 9.  RTC on 04/18/2016 for MD assesments, labs (CBC with diff, CMP, uric acid), and B12   Lequita Asal, MD  04/11/2016, 2:50 PM

## 2016-04-14 ENCOUNTER — Inpatient Hospital Stay: Payer: Medicare HMO

## 2016-04-14 ENCOUNTER — Other Ambulatory Visit: Payer: Medicare HMO

## 2016-04-14 ENCOUNTER — Telehealth: Payer: Self-pay | Admitting: *Deleted

## 2016-04-14 DIAGNOSIS — C8303 Small cell B-cell lymphoma, intra-abdominal lymph nodes: Secondary | ICD-10-CM | POA: Diagnosis not present

## 2016-04-14 DIAGNOSIS — C8300 Small cell B-cell lymphoma, unspecified site: Secondary | ICD-10-CM

## 2016-04-14 DIAGNOSIS — E538 Deficiency of other specified B group vitamins: Secondary | ICD-10-CM

## 2016-04-14 DIAGNOSIS — E790 Hyperuricemia without signs of inflammatory arthritis and tophaceous disease: Secondary | ICD-10-CM

## 2016-04-14 DIAGNOSIS — D509 Iron deficiency anemia, unspecified: Secondary | ICD-10-CM

## 2016-04-14 LAB — BASIC METABOLIC PANEL
Anion gap: 8 (ref 5–15)
BUN: 35 mg/dL — ABNORMAL HIGH (ref 6–20)
CO2: 24 mmol/L (ref 22–32)
Calcium: 9.1 mg/dL (ref 8.9–10.3)
Chloride: 102 mmol/L (ref 101–111)
Creatinine, Ser: 1.14 mg/dL — ABNORMAL HIGH (ref 0.44–1.00)
GFR calc Af Amer: 50 mL/min — ABNORMAL LOW (ref >60)
GFR calc non Af Amer: 43 mL/min — ABNORMAL LOW (ref >60)
Glucose, Bld: 214 mg/dL — ABNORMAL HIGH (ref 65–99)
Potassium: 3.3 mmol/L — ABNORMAL LOW (ref 3.5–5.1)
Sodium: 134 mmol/L — ABNORMAL LOW (ref 135–145)

## 2016-04-14 LAB — URIC ACID: Uric Acid, Serum: 7.4 mg/dL — ABNORMAL HIGH (ref 2.3–6.6)

## 2016-04-14 NOTE — Telephone Encounter (Signed)
-----   Message from Lequita Asal, MD sent at 04/14/2016  4:05 PM EDT ----- Regarding: Please call patient  Is she taking potassium?  Let's have her on potassium chloride 10 meq a day.  M  ----- Message -----    From: Lab In [facility] Interface    Sent: 04/14/2016   4:00 PM      To: Lequita Asal, MD

## 2016-04-14 NOTE — Telephone Encounter (Signed)
Called pt to ask her about if she was on potassium.  She states that she does not think so. She states that her med list is accurate. It does not show potassium on the list. I have informed her that I will send in potassium rx and the instructions will be on bottle and she will get someone to pick it up. Spoke with [facility] and she states to give her 30 day supply and have her take 1 days until otherwise directed  By doctor. rx sent in electronically

## 2016-04-18 ENCOUNTER — Inpatient Hospital Stay: Payer: Medicare HMO

## 2016-04-18 ENCOUNTER — Inpatient Hospital Stay (HOSPITAL_BASED_OUTPATIENT_CLINIC_OR_DEPARTMENT_OTHER): Payer: Medicare HMO | Admitting: Hematology and Oncology

## 2016-04-18 VITALS — BP 178/72 | HR 57 | Temp 98.5°F | Resp 18 | Wt 168.3 lb

## 2016-04-18 DIAGNOSIS — E538 Deficiency of other specified B group vitamins: Secondary | ICD-10-CM | POA: Diagnosis not present

## 2016-04-18 DIAGNOSIS — E785 Hyperlipidemia, unspecified: Secondary | ICD-10-CM

## 2016-04-18 DIAGNOSIS — K5900 Constipation, unspecified: Secondary | ICD-10-CM

## 2016-04-18 DIAGNOSIS — C8300 Small cell B-cell lymphoma, unspecified site: Secondary | ICD-10-CM

## 2016-04-18 DIAGNOSIS — D696 Thrombocytopenia, unspecified: Secondary | ICD-10-CM

## 2016-04-18 DIAGNOSIS — D6481 Anemia due to antineoplastic chemotherapy: Secondary | ICD-10-CM

## 2016-04-18 DIAGNOSIS — D509 Iron deficiency anemia, unspecified: Secondary | ICD-10-CM

## 2016-04-18 DIAGNOSIS — Z79899 Other long term (current) drug therapy: Secondary | ICD-10-CM

## 2016-04-18 DIAGNOSIS — C8303 Small cell B-cell lymphoma, intra-abdominal lymph nodes: Secondary | ICD-10-CM | POA: Diagnosis not present

## 2016-04-18 DIAGNOSIS — Z7982 Long term (current) use of aspirin: Secondary | ICD-10-CM

## 2016-04-18 DIAGNOSIS — E790 Hyperuricemia without signs of inflammatory arthritis and tophaceous disease: Secondary | ICD-10-CM

## 2016-04-18 DIAGNOSIS — I129 Hypertensive chronic kidney disease with stage 1 through stage 4 chronic kidney disease, or unspecified chronic kidney disease: Secondary | ICD-10-CM

## 2016-04-18 DIAGNOSIS — N189 Chronic kidney disease, unspecified: Secondary | ICD-10-CM

## 2016-04-18 LAB — COMPREHENSIVE METABOLIC PANEL
ALT: 17 U/L (ref 14–54)
AST: 14 U/L — ABNORMAL LOW (ref 15–41)
Albumin: 2.3 g/dL — ABNORMAL LOW (ref 3.5–5.0)
Alkaline Phosphatase: 38 U/L (ref 38–126)
Anion gap: 9 (ref 5–15)
BUN: 37 mg/dL — ABNORMAL HIGH (ref 6–20)
CO2: 24 mmol/L (ref 22–32)
Calcium: 9.2 mg/dL (ref 8.9–10.3)
Chloride: 98 mmol/L — ABNORMAL LOW (ref 101–111)
Creatinine, Ser: 1.48 mg/dL — ABNORMAL HIGH (ref 0.44–1.00)
GFR calc Af Amer: 36 mL/min — ABNORMAL LOW (ref >60)
GFR calc non Af Amer: 31 mL/min — ABNORMAL LOW (ref >60)
Glucose, Bld: 110 mg/dL — ABNORMAL HIGH (ref 65–99)
Potassium: 4 mmol/L (ref 3.5–5.1)
Sodium: 131 mmol/L — ABNORMAL LOW (ref 135–145)
Total Bilirubin: 0.7 mg/dL (ref 0.3–1.2)
Total Protein: 11.2 g/dL — ABNORMAL HIGH (ref 6.5–8.1)

## 2016-04-18 LAB — CBC WITH DIFFERENTIAL/PLATELET
Basophils Absolute: 0 10*3/uL (ref 0–0.1)
Basophils Relative: 0 %
Eosinophils Absolute: 0 10*3/uL (ref 0–0.7)
Eosinophils Relative: 0 %
HCT: 31.5 % — ABNORMAL LOW (ref 35.0–47.0)
Hemoglobin: 10.5 g/dL — ABNORMAL LOW (ref 12.0–16.0)
Lymphocytes Relative: 22 %
Lymphs Abs: 1.4 10*3/uL (ref 1.0–3.6)
MCH: 23.4 pg — ABNORMAL LOW (ref 26.0–34.0)
MCHC: 33.4 g/dL (ref 32.0–36.0)
MCV: 70.1 fL — ABNORMAL LOW (ref 80.0–100.0)
Monocytes Absolute: 0.5 10*3/uL (ref 0.2–0.9)
Monocytes Relative: 8 %
Neutro Abs: 4.3 10*3/uL (ref 1.4–6.5)
Neutrophils Relative %: 70 %
Platelets: 76 10*3/uL — ABNORMAL LOW (ref 150–440)
RBC: 4.49 MIL/uL (ref 3.80–5.20)
RDW: 23.2 % — ABNORMAL HIGH (ref 11.5–14.5)
WBC: 6.2 10*3/uL (ref 3.6–11.0)

## 2016-04-18 LAB — URIC ACID: Uric Acid, Serum: 7 mg/dL — ABNORMAL HIGH (ref 2.3–6.6)

## 2016-04-18 MED ORDER — CYANOCOBALAMIN 1000 MCG/ML IJ SOLN
1000.0000 ug | Freq: Once | INTRAMUSCULAR | Status: AC
Start: 2016-04-18 — End: 2016-04-18
  Administered 2016-04-18: 1000 ug via INTRAMUSCULAR
  Filled 2016-04-18: qty 1

## 2016-04-18 MED ORDER — PREDNISONE 5 MG PO TABS: 5.0000 mg | ORAL_TABLET | Freq: Every day | ORAL | Status: DC

## 2016-04-18 NOTE — Progress Notes (Signed)
[facility] day:  04/18/2016  Chief Complaint: ELOWEN DEBRUYN is a 82 y.o. female with lymphoplasmacytic lymphoma who is seen for assessment on day 20 of cycle #1 chlorambucil and prednisone.  HPI: The patient was last seen in the medical oncology clinic on 04/11/2016.  At that time, her energy level was good. SQ nodules were resolving.  She had no evidence of tumor lysis syndrome.  She denied any hyperviscosity symptoms.  Uric acid was 7.4 (stable).  Her prednisone was decreased from 20 mg a day to 10 mg a day on 04/16/2016.  Symptomatically, she feels "ok".  She has had some pains that went away.  Her "knots" in her skin and on her head continue to fade away.  She is taking her allopurinol.  Her ibrutinib just arrived.  She is anxious to go to [employer] today as she is hungry.  She is drinking orange soda.  She denies any B symptoms.  She denies any hypervisosity symptoms (nasal bleeding, blurry vision, headache, confusion, vertigo, dizziness, tinnitus, diplopia, or ataxia).   She is voiding well.     Past Medical History  Diagnosis Date  . Hypertension   . Hyperlipidemia   . Malignant lymphoma, lymphoplasmacytic ([place]) 12/27/2013  . 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 Renal insufficiency     Past Surgical History  Procedure Laterality Date  . Hemorrhoid surgery    . Abdominal hysterectomy      Family History  Problem Relation Age of Onset  . Cancer Brother     throat ca  . Cancer Brother     bone cancer    Social History:  reports that she has never smoked. She has never used smokeless tobacco. She reports that she does not drink alcohol or use illicit drugs.  She lives in [place] with her grandson and his wife. Her emergency contact is Cecille Po, her grandaughter- [phone]).  [name]'s phone number [phone]).  She is accompanied by a family member today.  Allergies: No Known Allergies  Current  Medications: Current Outpatient Prescriptions  Medication Sig Dispense Refill  . allopurinol (ZYLOPRIM) 100 MG tablet Take 2 tablets (200 mg total) by mouth daily. 40 tablet 0  . atorvastatin (LIPITOR) 20 MG tablet Take 20 mg by mouth daily.    . chlorambucil (LEUKERAN) 2 MG tablet Take 3 tablets (6 mg total) by mouth daily. Give on an empty stomach 1 hour before or 2 hours after meals. 21 tablet 0  . ferrous sulfate 325 (65 FE) MG EC tablet Take 1 tablet by mouth a day with orange juice or vitamin C 30 tablet 1  . folic acid (FOLVITE) 1 MG tablet Take 1 tablet (1 mg total) by mouth daily. 30 tablet 1  . ibuprofen (ADVIL,MOTRIN) 600 MG tablet Take 600 mg by mouth every 6 (six) hours as needed.     Marland Kitchen lisinopril-hydrochlorothiazide (PRINZIDE,ZESTORETIC) 20-12.5 MG per tablet Take 1 tablet by mouth daily.    . metoprolol tartrate (LOPRESSOR) 25 MG tablet Take 25 mg by mouth 2 (two) times daily.  0  . omeprazole (PRILOSEC) 20 MG capsule Take 20 mg by mouth daily.  0  . ondansetron (ZOFRAN) 4 MG tablet Take 1 tablet (4 mg total) by mouth every 8 (eight) hours as needed for nausea or vomiting. 20 tablet 1  . polyethylene glycol (MIRALAX / GLYCOLAX) packet Take 17 g by mouth daily as needed for mild constipation. 14 each 1  . predniSONE (  DELTASONE) 20 MG tablet Take 2 tablets (40 mg total) by mouth daily with breakfast. (Patient taking differently: Take 20 mg by mouth daily with breakfast. Patient is taking 5m of prednisone) 30 tablet 0  . IMBRUVICA 140 MG capsul Take 3 capsules by mouth daily. Reported on 04/18/2016     No current facility-administered medications for this visit.   Facility-Administered Medications Ordered in Other Visits  Medication Dose Route Frequency Provider Last Rate Last Dose  . cyanocobalamin ((VITAMIN B-12)) injection 1,000 mcg  1,000 mcg Intramuscular Once MLequita Asal MD      . cyanocobalamin ((VITAMIN B-12)) injection 1,000 mcg  1,000 mcg Intramuscular Once  MLequita Asal MD        Review of Systems:  GENERAL: Feels"pretty good". No fevers or sweats. Weight down 4 pounds since last visit. PERFORMANCE STATUS (ECOG): 2 HEENT: No visual changes, runny nose, sore throat, mouth sores or tenderness. Lungs:  Breathing well off oxygen.  No cough or wheezing.  No hemoptysis. Cardiac: No chest pain, palpitations, orthopnea, or PND. GI:  Appetite 100%.No nausea, vomiting, diarrhea, constipation, melena or hematochezia. GU: No urgency, frequency, dysuria, or hematuria. Musculoskeletal: No back pain. No joint pain. No muscle tenderness. Extremities: No pain or swelling. Skin: Nodules in skin associated with disease, diminishing. No rashes. Neuro: No headache, numbness or weakness, balance or coordination issues. Endocrine: No diabetes, thyroid issues, hot flashes or night sweats. Psych:No mood changes, depression or anxiety. Pain: No pain. Review of systems: All other systems reviewed and found to be negative.  Physical Exam: Blood pressure 178/72, pulse 57, temperature 98.5 F (36.9 C), temperature source Tympanic, resp. rate 18, weight 168 lb 5.1 oz (76.35 kg). GENERAL: Elderly woman sitting comfortably in the exam room in clinic in no acute distress.  She gets onto the table easily. MENTAL STATUS: Alert and oriented to person, place and time. HEAD: Brown hair. Normocephalic, atraumatic, face symmetric, no Cushingoid features. EYES: Brown eyes. Pupils equal round and reactive to light and accomodation. No conjunctivitis or scleral icterus. ENT: Oropharynx clear without lesion. Tongue normal. Mucous membranes moist.  RESPIRATORY: Normal respiratory excursion. No rales, wheezes or rhonchi. CARDIOVASCULAR: Regular rate and rhythm without murmur, rub or gallop. ABDOMEN: Soft, non-tender with active bowel sounds and no appreciable hepatosplenomegaly.   SKIN: Extensive nodules in subcutaneous tissue (scalp, flank,  back, paravertebral, abdominal wall, left inguinal region).  Many nodules have continued to decrease in size (scalp barely palpable; multiple nodules on back 2-3 cm; largest mass 5 cm). EXTREMITIES: No edema, no skin discoloration or tenderness. No palpable cords. LYMPH NODES: No palpable cervical, supraclavicular, axillary or inguinal adenopathy  NEUROLOGICAL: Unremarkable. PSYCH: Appropriate   Appointment on 04/18/2016  Component Date Value Ref Range Status  . WBC 04/18/2016 6.2  3.6 - 11.0 K/uL Final  . RBC 04/18/2016 4.49  3.80 - 5.20 MIL/uL Final  . Hemoglobin 04/18/2016 10.5* 12.0 - 16.0 g/dL Final  . HCT 04/18/2016 31.5* 35.0 - 47.0 % Final  . MCV 04/18/2016 70.1* 80.0 - 100.0 fL Final  . MCH 04/18/2016 23.4* 26.0 - 34.0 pg Final  . MCHC 04/18/2016 33.4  32.0 - 36.0 g/dL Final  . RDW 04/18/2016 23.2* 11.5 - 14.5 % Final  . Platelets 04/18/2016 76* 150 - 440 K/uL Final  . Neutrophils Relative % 04/18/2016 70%   Final  . Neutro Abs 04/18/2016 4.3  1.4 - 6.5 K/uL Final  . Lymphocytes Relative 04/18/2016 22%   Final  . Lymphs Abs 04/18/2016 1.4  1.0 - 3.6 K/uL Final  . Monocytes Relative 04/18/2016 8%   Final  . Monocytes Absolute 04/18/2016 0.5  0.2 - 0.9 K/uL Final  . Eosinophils Relative 04/18/2016 0%   Final  . Eosinophils Absolute 04/18/2016 0.0  0 - 0.7 K/uL Final  . Basophils Relative 04/18/2016 0%   Final  . Basophils Absolute 04/18/2016 0.0  0 - 0.1 K/uL Final  . Sodium 04/18/2016 131* 135 - 145 mmol/L Final  . Potassium 04/18/2016 4.0  3.5 - 5.1 mmol/L Final  . Chloride 04/18/2016 98* 101 - 111 mmol/L Final  . CO2 04/18/2016 24  22 - 32 mmol/L Final  . Glucose, Bld 04/18/2016 110* 65 - 99 mg/dL Final  . BUN 04/18/2016 37* 6 - 20 mg/dL Final  . Creatinine, Ser 04/18/2016 1.48* 0.44 - 1.00 mg/dL Final  . Calcium 04/18/2016 9.2  8.9 - 10.3 mg/dL Final  . Total Protein 04/18/2016 11.2* 6.5 - 8.1 g/dL Final  . Albumin 04/18/2016 2.3* 3.5 - 5.0 g/dL Final  . AST  04/18/2016 14* 15 - 41 U/L Final  . ALT 04/18/2016 17  14 - 54 U/L Final  . Alkaline Phosphatase 04/18/2016 38  38 - 126 U/L Final  . Total Bilirubin 04/18/2016 0.7  0.3 - 1.2 mg/dL Final  . GFR calc non Af Amer 04/18/2016 31* >60 mL/min Final  . GFR calc Af Amer 04/18/2016 36* >60 mL/min Final   Comment: (NOTE) The eGFR has been calculated using the CKD EPI equation. This calculation has not been validated in all clinical situations. eGFR's persistently <60 mL/min signify possible Chronic Kidney Disease.   . Anion gap 04/18/2016 9  5 - 15 Final  . Uric Acid, Serum 04/18/2016 7.0* 2.3 - 6.6 mg/dL Final    Assessment:  MONETTA LICK is a 82 y.o. 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 She has massive subcutaneous deposits, pulmonary nodules, involvement of left kidney, and bowel. Disease was unresponsive to Rituxan in 2014.   Chest, abdomen, and pelvic CT scan on 03/17/2016 revealed progression of diffuse lymphoma with subcutaneous deposits throughout the subcutaneous fat of the chest, abdomen,and pelvis. There were multiple nodular infiltrates throughout the lungs, likely due to lymphoma but could also represent infection or atypical infection. There was massive retroperitoneal lymphadenopathy with diffuse retroperitoneal, mesenteric, and pelvic lymphadenopathy. There was wall thickening and pelvicsmall bowel probably representing lymphomas involvement. There was probable direct invasion of the left kidney.  Biopsy of the dominant SQ nodal conglomeration around the right flank on 03/18/2016 revealed a persistent low-grade CD5 positive B-cell lymphoma.  Flow cytometry was positive for CD20, CD79a, CD5, CD138. Neoplastic cells were positive for kappa and cyclin D1.  CD 23 was negative.   Additional molecular markers are pending.  Bone marrow aspirate and biopsy on 03/18/2016 revealed no evidence of lymphoma or plasma cell neoplasm (0.2% kappa  monoclonal plasma cells by flow analysis).  Marrow was hypercellular for age (50-60%) with trilineage hematopoiesis and no increased blasts. There was mild to moderate widespread increase in reticulin fibers. There was no storage iron detected.  The following studies were abnormal:  SPEP revealed a 5.0 gm/dl monoclonal spike.  IgG was 696, IgA 187, and IgM > 5850.  Beta2-microglobulin was 9.6 (0.6-2.4).  Serum viscosity was 5.5 (1.6-1.9).  Uric acid was 11.1 on 03/19/2016.     Normal studies included:  hepatitis B surface antigen, hepatitis B core antibody total, hepatitis C antibody, HIV testing, and LDH (123).  G6PD assay was 9.2 (4.6-13.5).  She has iron deficiency (ferritin 27; iron saturation 11%), B12 deficiency (147), and folate deficiency (5.9).  She has chronic renal insufficiency (BUN was 30-35, creatinine was 1.38 - 1.80 with a GFR 29-39 ml/min). She receives B12 weekly x 6 then monthly (last 04/04/2016).  She is on folic acid.   She is day 20 of cycle #1 chlorambucil and prednisone (began 03/30/2016).  She has mild thrombocytopenia (76,000).  Symptomatically, her energy level is good. SQ nodules continue to improve.  She has no evidence of tumor lysis syndrome.  She denies any hyperviscosity symptoms.  Uric acid is 7.0 (slightly improved).  Plan: 1.  Labs today:  CBC with diff, CMP, uric acid. 2.  Continue prednisone 10 mg a day.  Decrease to 5 mg a day on 04/23/2016. 3.  Continue allopurinol. 4.  Continue folic acid. 5.  B12 today and weekly x 1 then monthly. 6.  Continue ferrous sulfate.  If unable to tolerate, begin IV iron. 7.  Anticipate initiation of ibrutinib next week. 8.  Chest abdomen CT without contrast on 04/22/2016 9.  RTC on 04/22/2016 for labs (BMP, uric acid) 10.  RTC on 04/25/2016 for MD assessment, labs (CBC with diff, CMP, IgM, SPEP, uric acid), and review scans + B12   Lequita Asal, MD  04/18/2016, 4:06 PM

## 2016-04-18 NOTE — Progress Notes (Signed)
Patient is here today for follow up, she does mention she had pains but they went away. Also Dollie the daughter said she had papers sent over here from her employer for [facility] and they have not been sent back to her HR

## 2016-04-19 ENCOUNTER — Other Ambulatory Visit: Payer: Self-pay | Admitting: *Deleted

## 2016-04-19 DIAGNOSIS — C8300 Small cell B-cell lymphoma, unspecified site: Secondary | ICD-10-CM

## 2016-04-21 ENCOUNTER — Other Ambulatory Visit: Payer: Self-pay

## 2016-04-21 ENCOUNTER — Inpatient Hospital Stay: Payer: Medicare HMO

## 2016-04-21 ENCOUNTER — Other Ambulatory Visit
Admission: RE | Admit: 2016-04-21 | Discharge: 2016-04-21 | Disposition: A | Discharge status: Home / Self Care | Payer: Medicare HMO | Source: Ambulatory Visit | Attending: Hematology and Oncology | Admitting: Hematology and Oncology

## 2016-04-21 ENCOUNTER — Ambulatory Visit
Admission: RE | Admit: 2016-04-21 | Discharge: 2016-04-21 | Disposition: A | Discharge status: Home / Self Care | Payer: Medicare HMO | Source: Ambulatory Visit | Attending: Hematology and Oncology | Admitting: Hematology and Oncology

## 2016-04-21 DIAGNOSIS — J90 Pleural effusion, not elsewhere classified: Secondary | ICD-10-CM | POA: Insufficient documentation

## 2016-04-21 DIAGNOSIS — N1330 Unspecified hydronephrosis: Secondary | ICD-10-CM | POA: Insufficient documentation

## 2016-04-21 DIAGNOSIS — C8300 Small cell B-cell lymphoma, unspecified site: Secondary | ICD-10-CM

## 2016-04-21 DIAGNOSIS — C8303 Small cell B-cell lymphoma, intra-abdominal lymph nodes: Secondary | ICD-10-CM | POA: Diagnosis not present

## 2016-04-21 DIAGNOSIS — R590 Localized enlarged lymph nodes: Secondary | ICD-10-CM | POA: Insufficient documentation

## 2016-04-21 DIAGNOSIS — R918 Other nonspecific abnormal finding of lung field: Secondary | ICD-10-CM | POA: Insufficient documentation

## 2016-04-21 LAB — BASIC METABOLIC PANEL
Anion gap: 11 (ref 5–15)
BUN: 29 mg/dL — ABNORMAL HIGH (ref 6–20)
CO2: 23 mmol/L (ref 22–32)
Calcium: 9.3 mg/dL (ref 8.9–10.3)
Chloride: 102 mmol/L (ref 101–111)
Creatinine, Ser: 1.19 mg/dL — ABNORMAL HIGH (ref 0.44–1.00)
GFR calc Af Amer: 47 mL/min — ABNORMAL LOW (ref >60)
GFR calc non Af Amer: 41 mL/min — ABNORMAL LOW (ref >60)
Glucose, Bld: 101 mg/dL — ABNORMAL HIGH (ref 65–99)
Potassium: 3.7 mmol/L (ref 3.5–5.1)
Sodium: 136 mmol/L (ref 135–145)

## 2016-04-21 LAB — URIC ACID: Uric Acid, Serum: 7.6 mg/dL — ABNORMAL HIGH (ref 2.3–6.6)

## 2016-04-25 ENCOUNTER — Inpatient Hospital Stay (HOSPITAL_BASED_OUTPATIENT_CLINIC_OR_DEPARTMENT_OTHER): Payer: Medicare HMO | Admitting: Hematology and Oncology

## 2016-04-25 ENCOUNTER — Inpatient Hospital Stay: Payer: Medicare HMO

## 2016-04-25 ENCOUNTER — Inpatient Hospital Stay: Payer: Medicare HMO | Admitting: Hematology and Oncology

## 2016-04-25 ENCOUNTER — Ambulatory Visit: Payer: Medicare HMO

## 2016-04-25 ENCOUNTER — Other Ambulatory Visit: Payer: Medicare HMO

## 2016-04-25 ENCOUNTER — Encounter: Payer: Self-pay | Admitting: Hematology and Oncology

## 2016-04-25 ENCOUNTER — Other Ambulatory Visit: Payer: Self-pay | Admitting: Hematology and Oncology

## 2016-04-25 ENCOUNTER — Ambulatory Visit: Payer: Medicare HMO | Admitting: Hematology and Oncology

## 2016-04-25 VITALS — BP 182/78 | HR 56 | Temp 99.1°F | Resp 18 | Wt 173.2 lb

## 2016-04-25 DIAGNOSIS — C8300 Small cell B-cell lymphoma, unspecified site: Secondary | ICD-10-CM

## 2016-04-25 DIAGNOSIS — J90 Pleural effusion, not elsewhere classified: Secondary | ICD-10-CM

## 2016-04-25 DIAGNOSIS — Z79899 Other long term (current) drug therapy: Secondary | ICD-10-CM

## 2016-04-25 DIAGNOSIS — D696 Thrombocytopenia, unspecified: Secondary | ICD-10-CM

## 2016-04-25 DIAGNOSIS — N1330 Unspecified hydronephrosis: Secondary | ICD-10-CM | POA: Diagnosis not present

## 2016-04-25 DIAGNOSIS — E785 Hyperlipidemia, unspecified: Secondary | ICD-10-CM

## 2016-04-25 DIAGNOSIS — N189 Chronic kidney disease, unspecified: Secondary | ICD-10-CM

## 2016-04-25 DIAGNOSIS — E538 Deficiency of other specified B group vitamins: Secondary | ICD-10-CM

## 2016-04-25 DIAGNOSIS — D6481 Anemia due to antineoplastic chemotherapy: Secondary | ICD-10-CM

## 2016-04-25 DIAGNOSIS — R918 Other nonspecific abnormal finding of lung field: Secondary | ICD-10-CM

## 2016-04-25 DIAGNOSIS — R590 Localized enlarged lymph nodes: Secondary | ICD-10-CM | POA: Diagnosis not present

## 2016-04-25 DIAGNOSIS — Z808 Family history of malignant neoplasm of other organs or systems: Secondary | ICD-10-CM

## 2016-04-25 DIAGNOSIS — C8303 Small cell B-cell lymphoma, intra-abdominal lymph nodes: Secondary | ICD-10-CM

## 2016-04-25 DIAGNOSIS — Z7982 Long term (current) use of aspirin: Secondary | ICD-10-CM

## 2016-04-25 DIAGNOSIS — D509 Iron deficiency anemia, unspecified: Secondary | ICD-10-CM

## 2016-04-25 DIAGNOSIS — K5900 Constipation, unspecified: Secondary | ICD-10-CM

## 2016-04-25 DIAGNOSIS — I129 Hypertensive chronic kidney disease with stage 1 through stage 4 chronic kidney disease, or unspecified chronic kidney disease: Secondary | ICD-10-CM

## 2016-04-25 LAB — URIC ACID: Uric Acid, Serum: 7.1 mg/dL — ABNORMAL HIGH (ref 2.3–6.6)

## 2016-04-25 LAB — COMPREHENSIVE METABOLIC PANEL
ALT: 18 U/L (ref 14–54)
AST: 22 U/L (ref 15–41)
Albumin: 2.4 g/dL — ABNORMAL LOW (ref 3.5–5.0)
Alkaline Phosphatase: 39 U/L (ref 38–126)
Anion gap: 10 (ref 5–15)
BUN: 22 mg/dL — ABNORMAL HIGH (ref 6–20)
CO2: 23 mmol/L (ref 22–32)
Calcium: 9.1 mg/dL (ref 8.9–10.3)
Chloride: 101 mmol/L (ref 101–111)
Creatinine, Ser: 1.27 mg/dL — ABNORMAL HIGH (ref 0.44–1.00)
GFR calc Af Amer: 44 mL/min — ABNORMAL LOW (ref >60)
GFR calc non Af Amer: 38 mL/min — ABNORMAL LOW (ref >60)
Glucose, Bld: 160 mg/dL — ABNORMAL HIGH (ref 65–99)
Potassium: 3.7 mmol/L (ref 3.5–5.1)
Sodium: 134 mmol/L — ABNORMAL LOW (ref 135–145)
Total Bilirubin: 0.4 mg/dL (ref 0.3–1.2)
Total Protein: 10.5 g/dL — ABNORMAL HIGH (ref 6.5–8.1)

## 2016-04-25 LAB — CBC WITH DIFFERENTIAL/PLATELET
Basophils Absolute: 0 10*3/uL (ref 0–0.1)
Basophils Relative: 0 %
Eosinophils Absolute: 0.1 10*3/uL (ref 0–0.7)
Eosinophils Relative: 3 %
HCT: 31.4 % — ABNORMAL LOW (ref 35.0–47.0)
Hemoglobin: 10 g/dL — ABNORMAL LOW (ref 12.0–16.0)
Lymphocytes Relative: 38 %
Lymphs Abs: 1.8 10*3/uL (ref 1.0–3.6)
MCH: 22.4 pg — ABNORMAL LOW (ref 26.0–34.0)
MCHC: 31.9 g/dL — ABNORMAL LOW (ref 32.0–36.0)
MCV: 70.4 fL — ABNORMAL LOW (ref 80.0–100.0)
Monocytes Absolute: 0.5 10*3/uL (ref 0.2–0.9)
Monocytes Relative: 12 %
Neutro Abs: 2.3 10*3/uL (ref 1.4–6.5)
Neutrophils Relative %: 47 %
Platelets: 79 10*3/uL — ABNORMAL LOW (ref 150–440)
RBC: 4.46 MIL/uL (ref 3.80–5.20)
RDW: 23.7 % — ABNORMAL HIGH (ref 11.5–14.5)
WBC: 4.7 10*3/uL (ref 3.6–11.0)

## 2016-04-25 MED ORDER — CYANOCOBALAMIN 1000 MCG/ML IJ SOLN
1000.0000 ug | Freq: Once | INTRAMUSCULAR | Status: AC
  Administered 2016-04-25: 1000 ug via INTRAMUSCULAR
  Filled 2016-04-25: qty 1

## 2016-04-25 MED ORDER — ALLOPURINOL 100 MG PO TABS: 200.0000 mg | ORAL_TABLET | Freq: Every day | ORAL | 1 refills | Status: DC

## 2016-04-25 NOTE — Progress Notes (Signed)
Patient is here with granddaughter, no complaints today. Would like to get her b12 shot in the room

## 2016-04-25 NOTE — Progress Notes (Signed)
Dr. Mike Gip will tell pt when to start Ibrutinib. After she sees pt today. I went over education sheet for medications about taking with glass of water and take it about the same time every day. Do not crush or break the capsule.  Side effects possible: lowering plt, neutrophils, hgb. Counts.  Diarrhea, constipation, fatigue, muscle aches.nausea, swelling , cough to name a few.  All of the side effects I went over.  Told her to call if she feels anything different from her normal because at first she could not remember that she already got the med last week thru [employer].  Pt and her family that stays with her voiced understanding of the drug. Pt signed consent for the ibrutinib 140 mg capsules and she will take 3 daily every day.

## 2016-04-25 NOTE — Progress Notes (Signed)
[facility] day:  04/25/16  Chief Complaint: Kristy Solomon is a 82 y.o. female with lymphoplasmacytic lymphoma who is seen for assessment prior to initiation of Ibrutinib.  HPI: The patient was last seen in the medical oncology clinic on 04/18/2016.  At that time, her energy level was good. SQ nodules continued to improve.  She had no evidence of tumor lysis syndrome.  Uric acid was 7.0 (slightly improved).  Prednisone was decreased from 10 mg a day to 5 mg a day on 04/23/2016.  Chest, abdomen, and pelvic CT scan on 04/21/2016 revealed a partial response to therapy.  There were bilateral pulmonary nodules/masses, measuring up to 5.6 cm in the right lower lobe (improved).  There was trace left pleural effusion.  There was a 12.7 cm left renal/perirenal mass (decreased) with associated mild left hydronephrosis.  Retroperitoneal/left pelvic lymphadenopathy was mildly decreased.  Multifocal subcutaneous nodules, measuring up to 4.5 cm in the left lower anterior abdominal wall were stable to mildly improved.  Symptomatically, she feels well.  She denies any B symptoms.  She denies any hypervisosity symptoms (nasal bleeding, blurry vision, headache, confusion, vertigo, dizziness, tinnitus, diplopia, or ataxia).   She is taking her allopurinol and voiding well.     Past Medical History:  Diagnosis Date  . 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 Hyperlipidemia   . Hypertension   . Malignant lymphoma, lymphoplasmacytic ([place]) 12/27/2013  . Renal insufficiency     Past Surgical History:  Procedure Laterality Date  . ABDOMINAL HYSTERECTOMY    . HEMORRHOID SURGERY      Family History  Problem Relation Age of Onset  . Cancer Brother     throat ca  . Cancer Brother     bone cancer    Social History:  reports that she has never smoked. She has never used smokeless tobacco. She reports that she does not drink alcohol or use drugs.  She lives in  [place] with her grandson and his wife. Her emergency contact is Kristy Solomon, her grandaughter- [phone]).  Kristy Solomon's phone number [phone]).  She is accompanied by a family member today.  Allergies: No Known Allergies  Current Medications: Current Outpatient Prescriptions  Medication Sig Dispense Refill  . allopurinol (ZYLOPRIM) 100 MG tablet Take 2 tablets (200 mg total) by mouth daily. 40 tablet 0  . atorvastatin (LIPITOR) 20 MG tablet Take 20 mg by mouth daily.    . chlorambucil (LEUKERAN) 2 MG tablet Take 3 tablets (6 mg total) by mouth daily. Give on an empty stomach 1 hour before or 2 hours after meals. 21 tablet 0  . ferrous sulfate 325 (65 FE) MG EC tablet Take 1 tablet by mouth a day with orange juice or vitamin C 30 tablet 1  . folic acid (FOLVITE) 1 MG tablet Take 1 tablet (1 mg total) by mouth daily. 30 tablet 1  . ibuprofen (ADVIL,MOTRIN) 600 MG tablet Take 600 mg by mouth every 6 (six) hours as needed.     . IMBRUVICA 140 MG capsul Take 3 capsules by mouth daily. Reported on 04/18/2016    . lisinopril-hydrochlorothiazide (PRINZIDE,ZESTORETIC) 20-12.5 MG per tablet Take 1 tablet by mouth daily.    . metoprolol tartrate (LOPRESSOR) 25 MG tablet Take 25 mg by mouth 2 (two) times daily.  0  . omeprazole (PRILOSEC) 20 MG capsule Take 20 mg by mouth daily.  0  . ondansetron (ZOFRAN) 4 MG tablet Take 1 tablet (4 mg total)  by mouth every 8 (eight) hours as needed for nausea or vomiting. 20 tablet 1  . polyethylene glycol (MIRALAX / GLYCOLAX) packet Take 17 g by mouth daily as needed for mild constipation. 14 each 1  . predniSONE (DELTASONE) 20 MG tablet Take 2 tablets (40 mg total) by mouth daily with breakfast. (Patient taking differently: Take 20 mg by mouth daily with breakfast. Patient is taking 39m of prednisone) 30 tablet 0  . predniSONE (DELTASONE) 5 MG tablet Take 1 tablet (5 mg total) by mouth daily with breakfast. 20 tablet 0   No current facility-administered  medications for this visit.    Facility-Administered Medications Ordered in Other Visits  Medication Dose Route Frequency Provider Last Rate Last Dose  . cyanocobalamin ((VITAMIN B-12)) injection 1,000 mcg  1,000 mcg Intramuscular Once MLequita Asal MD      . cyanocobalamin ((VITAMIN B-12)) injection 1,000 mcg  1,000 mcg Intramuscular Once MLequita Asal MD        Review of Systems:  GENERAL: Feels "good". No fevers or sweats. Weight up 5 pounds since last visit. PERFORMANCE STATUS (ECOG): 2 HEENT: No visual changes, runny nose, sore throat, mouth sores or tenderness. Lungs:  No shortness of breath or cough.  No wheezing.  No hemoptysis. Cardiac: No chest pain, palpitations, orthopnea, or PND. GI:  Appetite 100%.Eating well.  No nausea, vomiting, diarrhea, constipation, melena or hematochezia. GU: No urgency, frequency, dysuria, or hematuria. Musculoskeletal: No back pain. No joint pain. No muscle tenderness. Extremities: No pain or swelling. Skin: Nodules in skin associated with disease, diminishing. No rashes. Neuro: No headache, numbness or weakness, balance or coordination issues. Endocrine: No diabetes, thyroid issues, hot flashes or night sweats. Psych:No mood changes, depression or anxiety. Pain: No pain. Review of systems: All other systems reviewed and found to be negative.  Physical Exam: Blood pressure (!) 182/78, pulse (!) 56, temperature 99.1 F (37.3 C), temperature source Tympanic, resp. rate 18, weight 173 lb 2.7 oz (78.5 kg). GENERAL: Elderly woman sitting comfortably in the exam room in clinic in no acute distress.  She gets onto the table easily. MENTAL STATUS: Alert and oriented to person, place and time. HEAD: Brown hair. Normocephalic, atraumatic, face symmetric, no Cushingoid features. EYES: Brown eyes. Pupils equal round and reactive to light and accomodation. No conjunctivitis or scleral icterus. ENT: Oropharynx clear  without lesion. Tongue normal. Mucous membranes moist.  RESPIRATORY: Normal respiratory excursion. No rales, wheezes or rhonchi. CARDIOVASCULAR: Regular rate and rhythm without murmur, rub or gallop. ABDOMEN: Soft, non-tender with active bowel sounds and no appreciable hepatosplenomegaly.   SKIN: Scattered nodules in subcutaneous tissue.  Scalp nodule resolved.  Multiple back nodules 2 cm.  Largest nodule right flank (5 cm). EXTREMITIES: No edema, no skin discoloration or tenderness. No palpable cords. LYMPH NODES: No palpable cervical, supraclavicular, axillary or inguinal adenopathy  NEUROLOGICAL: Unremarkable. PSYCH: Appropriate   Appointment on 04/25/2016  Component Date Value Ref Range Status  . WBC 04/25/2016 4.7  3.6 - 11.0 K/uL Final  . RBC 04/25/2016 4.46  3.80 - 5.20 MIL/uL Final  . Hemoglobin 04/25/2016 10.0* 12.0 - 16.0 g/dL Final  . HCT 04/25/2016 31.4* 35.0 - 47.0 % Final  . MCV 04/25/2016 70.4* 80.0 - 100.0 fL Final  . MCH 04/25/2016 22.4* 26.0 - 34.0 pg Final  . MCHC 04/25/2016 31.9* 32.0 - 36.0 g/dL Final  . RDW 04/25/2016 23.7* 11.5 - 14.5 % Final  . Platelets 04/25/2016 79* 150 - 440 K/uL Final  . Neutrophils  Relative % 04/25/2016 47%  % Final  . Neutro Abs 04/25/2016 2.3  1.4 - 6.5 K/uL Final  . Lymphocytes Relative 04/25/2016 38%  % Final  . Lymphs Abs 04/25/2016 1.8  1.0 - 3.6 K/uL Final  . Monocytes Relative 04/25/2016 12%  % Final  . Monocytes Absolute 04/25/2016 0.5  0.2 - 0.9 K/uL Final  . Eosinophils Relative 04/25/2016 3%  % Final  . Eosinophils Absolute 04/25/2016 0.1  0 - 0.7 K/uL Final  . Basophils Relative 04/25/2016 0%  % Final  . Basophils Absolute 04/25/2016 0.0  0 - 0.1 K/uL Final  . Sodium 04/25/2016 134* 135 - 145 mmol/L Final  . Potassium 04/25/2016 3.7  3.5 - 5.1 mmol/L Final  . Chloride 04/25/2016 101  101 - 111 mmol/L Final  . CO2 04/25/2016 23  22 - 32 mmol/L Final  . Glucose, Bld 04/25/2016 160* 65 - 99 mg/dL Final  . BUN  04/25/2016 22* 6 - 20 mg/dL Final  . Creatinine, Ser 04/25/2016 1.27* 0.44 - 1.00 mg/dL Final  . Calcium 04/25/2016 9.1  8.9 - 10.3 mg/dL Final  . Total Protein 04/25/2016 10.5* 6.5 - 8.1 g/dL Final  . Albumin 04/25/2016 2.4* 3.5 - 5.0 g/dL Final  . AST 04/25/2016 22  15 - 41 U/L Final  . ALT 04/25/2016 18  14 - 54 U/L Final  . Alkaline Phosphatase 04/25/2016 39  38 - 126 U/L Final  . Total Bilirubin 04/25/2016 0.4  0.3 - 1.2 mg/dL Final  . GFR calc non Af Amer 04/25/2016 38* >60 mL/min Final  . GFR calc Af Amer 04/25/2016 44* >60 mL/min Final   Comment: (NOTE) The eGFR has been calculated using the CKD EPI equation. This calculation has not been validated in all clinical situations. eGFR's persistently <60 mL/min signify possible Chronic Kidney Disease.   . Anion gap 04/25/2016 10  5 - 15 Final  . Uric Acid, Serum 04/25/2016 7.1* 2.3 - 6.6 mg/dL Final    Assessment:  Kristy Solomon is a 82 y.o. 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 She has massive subcutaneous deposits, pulmonary nodules, involvement of left kidney, and bowel. Disease was unresponsive to Rituxan in 2014.   Chest, abdomen, and pelvic CT scan on 03/17/2016 revealed progression of diffuse lymphoma with subcutaneous deposits throughout the subcutaneous fat of the chest, abdomen,and pelvis. There were multiple nodular infiltrates throughout the lungs, likely due to lymphoma but could also represent infection or atypical infection. There was massive retroperitoneal lymphadenopathy with diffuse retroperitoneal, mesenteric, and pelvic lymphadenopathy. There was wall thickening and pelvicsmall bowel probably representing lymphomas involvement. There was probable direct invasion of the left kidney.  Biopsy of the dominant SQ nodal conglomeration around the right flank on 03/18/2016 revealed a persistent low-grade CD5 positive B-cell lymphoma.  Flow cytometry was positive for CD20,  CD79a, CD5, CD138. Neoplastic cells were positive for kappa and cyclin D1.  CD 23 was negative.   Additional molecular markers are pending.  Bone marrow aspirate and biopsy on 03/18/2016 revealed no evidence of lymphoma or plasma cell neoplasm (0.2% kappa monoclonal plasma cells by flow analysis).  Marrow was hypercellular for age (50-60%) with trilineage hematopoiesis and no increased blasts. There was mild to moderate widespread increase in reticulin fibers. There was no storage iron detected.  The following studies were abnormal:  SPEP revealed a 5.0 gm/dl monoclonal spike.  IgG was 696, IgA 187, and IgM > 5850.  Beta2-microglobulin was 9.6 (0.6-2.4).  Serum viscosity was 5.5 (1.6-1.9).  Uric acid was 11.1 on 03/19/2016.     Normal studies included:  hepatitis B surface antigen, hepatitis B core antibody total, hepatitis C antibody, HIV testing, and LDH (123).  G6PD assay was 9.2 (4.6-13.5).   She has iron deficiency (ferritin 27; iron saturation 11%), B12 deficiency (147), and folate deficiency (5.9).  She has chronic renal insufficiency (BUN was 30-35, creatinine was 1.38 - 1.80 with a GFR 29-39 ml/min). She has received B12 weekly (began 03/28/2016; last 04/18/2016).  She is on folic acid.   She is day 27 of cycle #1 chlorambucil and prednisone (began 03/30/2016).  She has mild thrombocytopenia (79,000).  Chest, abdomen, and pelvic CT scan on 04/21/2016 revealed a partial response to therapy.  There were bilateral pulmonary nodules/masses, measuring up to 5.6 cm in the right lower lobe (improved).  There was trace left pleural effusion.  There was a 12.7 cm left renal/perirenal mass (decreased) with associated mild left hydronephrosis.  Retroperitoneal/left pelvic lymphadenopathy was mildly decreased.  Multifocal subcutaneous nodules, measuring up to 4.5 cm in the left lower anterior abdominal wall were stable to mildly improved.  Symptomatically, her energy level is good. SQ nodules continue  to improve.  She has no evidence of tumor lysis syndrome.  She denies any hyperviscosity symptoms.  Uric acid is 7.1 (stable).  Plan: 1.  Labs today:  CBC with diff, CMP, IgM, SPEP, uric acid, ferritin. 2.  Review scans- partial response. 3.  Discuss switch to Ibutinib 420 mg a day.  Side effects reviewed.  Chemotherapy teaching provided by nursing.  Patient consented to treatment. 4.  Continue prednisone 5 mg a day.  Decrease to 5 mg QOD on 04/30/2016. 5.  Continue allopurinol. 6.  Continue folic acid. 7.  B12 today and then monthly (09/18, 10/16, 11/13, 12/11). 8.  Continue ferrous sulfate.  If unable to tolerate, begin IV iron. 9.  RTC twice weekly (07/28, 07/31, and 08/04) for labs (BMP, uric acid; CBC weekly) 10.  RTC on 05/09/2016 for MD assessment and labs (CBC with diff, CMP, uric acid, folate, cortisol).   Lequita Asal, MD  04/25/2016, 4:08 PM

## 2016-04-26 ENCOUNTER — Other Ambulatory Visit: Payer: Self-pay | Admitting: *Deleted

## 2016-04-26 ENCOUNTER — Other Ambulatory Visit: Payer: Self-pay | Admitting: Hematology and Oncology

## 2016-04-26 DIAGNOSIS — C8300 Small cell B-cell lymphoma, unspecified site: Secondary | ICD-10-CM

## 2016-04-26 DIAGNOSIS — D509 Iron deficiency anemia, unspecified: Secondary | ICD-10-CM

## 2016-04-26 LAB — PROTEIN ELECTROPHORESIS, SERUM
A/G Ratio: 0.4 — ABNORMAL LOW (ref 0.7–1.7)
Albumin ELP: 3 g/dL (ref 2.9–4.4)
Alpha-1-Globulin: 0.3 g/dL (ref 0.0–0.4)
Alpha-2-Globulin: 0.6 g/dL (ref 0.4–1.0)
Beta Globulin: 1.3 g/dL (ref 0.7–1.3)
Gamma Globulin: 5.4 g/dL — ABNORMAL HIGH (ref 0.4–1.8)
Globulin, Total: 7.5 g/dL — ABNORMAL HIGH (ref 2.2–3.9)
M-Spike, %: 4.5 g/dL — ABNORMAL HIGH
Total Protein ELP: 10.5 g/dL — ABNORMAL HIGH (ref 6.0–8.5)

## 2016-04-26 LAB — IGM: IgM, Serum: >5850 mg/dL — ABNORMAL HIGH (ref 26–217)

## 2016-04-29 ENCOUNTER — Inpatient Hospital Stay: Payer: Medicare HMO

## 2016-04-29 DIAGNOSIS — C8303 Small cell B-cell lymphoma, intra-abdominal lymph nodes: Secondary | ICD-10-CM | POA: Diagnosis not present

## 2016-04-29 DIAGNOSIS — D509 Iron deficiency anemia, unspecified: Secondary | ICD-10-CM

## 2016-04-29 DIAGNOSIS — C8300 Small cell B-cell lymphoma, unspecified site: Secondary | ICD-10-CM

## 2016-04-29 LAB — BASIC METABOLIC PANEL
Anion gap: 8 (ref 5–15)
BUN: 26 mg/dL — ABNORMAL HIGH (ref 6–20)
CO2: 25 mmol/L (ref 22–32)
Calcium: 9.2 mg/dL (ref 8.9–10.3)
Chloride: 99 mmol/L — ABNORMAL LOW (ref 101–111)
Creatinine, Ser: 1.2 mg/dL — ABNORMAL HIGH (ref 0.44–1.00)
GFR calc Af Amer: 47 mL/min — ABNORMAL LOW (ref >60)
GFR calc non Af Amer: 40 mL/min — ABNORMAL LOW (ref >60)
Glucose, Bld: 105 mg/dL — ABNORMAL HIGH (ref 65–99)
Potassium: 3.6 mmol/L (ref 3.5–5.1)
Sodium: 132 mmol/L — ABNORMAL LOW (ref 135–145)

## 2016-04-29 LAB — URIC ACID: Uric Acid, Serum: 6.7 mg/dL — ABNORMAL HIGH (ref 2.3–6.6)

## 2016-05-02 ENCOUNTER — Inpatient Hospital Stay: Payer: Medicare HMO

## 2016-05-02 DIAGNOSIS — D509 Iron deficiency anemia, unspecified: Secondary | ICD-10-CM

## 2016-05-02 DIAGNOSIS — C8303 Small cell B-cell lymphoma, intra-abdominal lymph nodes: Secondary | ICD-10-CM | POA: Diagnosis not present

## 2016-05-02 DIAGNOSIS — C8300 Small cell B-cell lymphoma, unspecified site: Secondary | ICD-10-CM

## 2016-05-02 LAB — BASIC METABOLIC PANEL
Anion gap: 8 (ref 5–15)
BUN: 30 mg/dL — ABNORMAL HIGH (ref 6–20)
CO2: 26 mmol/L (ref 22–32)
Calcium: 9.3 mg/dL (ref 8.9–10.3)
Chloride: 98 mmol/L — ABNORMAL LOW (ref 101–111)
Creatinine, Ser: 1.32 mg/dL — ABNORMAL HIGH (ref 0.44–1.00)
GFR calc Af Amer: 42 mL/min — ABNORMAL LOW (ref >60)
GFR calc non Af Amer: 36 mL/min — ABNORMAL LOW (ref >60)
Glucose, Bld: 92 mg/dL (ref 65–99)
Potassium: 3.7 mmol/L (ref 3.5–5.1)
Sodium: 132 mmol/L — ABNORMAL LOW (ref 135–145)

## 2016-05-02 LAB — CBC WITH DIFFERENTIAL/PLATELET
Basophils Absolute: 0 10*3/uL (ref 0–0.1)
Basophils Relative: 1 %
Eosinophils Absolute: 0.2 10*3/uL (ref 0–0.7)
Eosinophils Relative: 3 %
HCT: 32.4 % — ABNORMAL LOW (ref 35.0–47.0)
Hemoglobin: 10.9 g/dL — ABNORMAL LOW (ref 12.0–16.0)
Lymphocytes Relative: 62 %
Lymphs Abs: 3.6 10*3/uL (ref 1.0–3.6)
MCH: 23.6 pg — ABNORMAL LOW (ref 26.0–34.0)
MCHC: 33.5 g/dL (ref 32.0–36.0)
MCV: 70.4 fL — ABNORMAL LOW (ref 80.0–100.0)
Monocytes Absolute: 1 10*3/uL — ABNORMAL HIGH (ref 0.2–0.9)
Monocytes Relative: 18 %
Neutro Abs: 0.9 10*3/uL — ABNORMAL LOW (ref 1.4–6.5)
Neutrophils Relative %: 16 %
Platelets: 80 10*3/uL — ABNORMAL LOW (ref 150–440)
RBC: 4.61 MIL/uL (ref 3.80–5.20)
RDW: 24 % — ABNORMAL HIGH (ref 11.5–14.5)
WBC: 5.7 10*3/uL (ref 3.6–11.0)

## 2016-05-02 LAB — URIC ACID: Uric Acid, Serum: 5.8 mg/dL (ref 2.3–6.6)

## 2016-05-06 ENCOUNTER — Inpatient Hospital Stay: Payer: Medicare HMO | Attending: Hematology and Oncology

## 2016-05-06 DIAGNOSIS — R200 Anesthesia of skin: Secondary | ICD-10-CM | POA: Insufficient documentation

## 2016-05-06 DIAGNOSIS — N2889 Other specified disorders of kidney and ureter: Secondary | ICD-10-CM | POA: Insufficient documentation

## 2016-05-06 DIAGNOSIS — C8300 Small cell B-cell lymphoma, unspecified site: Secondary | ICD-10-CM | POA: Insufficient documentation

## 2016-05-06 DIAGNOSIS — H538 Other visual disturbances: Secondary | ICD-10-CM | POA: Insufficient documentation

## 2016-05-06 DIAGNOSIS — E538 Deficiency of other specified B group vitamins: Secondary | ICD-10-CM | POA: Insufficient documentation

## 2016-05-06 DIAGNOSIS — E785 Hyperlipidemia, unspecified: Secondary | ICD-10-CM | POA: Insufficient documentation

## 2016-05-06 DIAGNOSIS — R590 Localized enlarged lymph nodes: Secondary | ICD-10-CM | POA: Insufficient documentation

## 2016-05-06 DIAGNOSIS — D6481 Anemia due to antineoplastic chemotherapy: Secondary | ICD-10-CM | POA: Insufficient documentation

## 2016-05-06 DIAGNOSIS — D696 Thrombocytopenia, unspecified: Secondary | ICD-10-CM | POA: Insufficient documentation

## 2016-05-06 DIAGNOSIS — R319 Hematuria, unspecified: Secondary | ICD-10-CM | POA: Diagnosis present

## 2016-05-06 DIAGNOSIS — R109 Unspecified abdominal pain: Secondary | ICD-10-CM | POA: Insufficient documentation

## 2016-05-06 DIAGNOSIS — R202 Paresthesia of skin: Secondary | ICD-10-CM | POA: Diagnosis not present

## 2016-05-06 DIAGNOSIS — R918 Other nonspecific abnormal finding of lung field: Secondary | ICD-10-CM | POA: Diagnosis not present

## 2016-05-06 DIAGNOSIS — D509 Iron deficiency anemia, unspecified: Secondary | ICD-10-CM

## 2016-05-06 DIAGNOSIS — I10 Essential (primary) hypertension: Secondary | ICD-10-CM | POA: Insufficient documentation

## 2016-05-06 DIAGNOSIS — Z79899 Other long term (current) drug therapy: Secondary | ICD-10-CM | POA: Insufficient documentation

## 2016-05-06 DIAGNOSIS — Z7951 Long term (current) use of inhaled steroids: Secondary | ICD-10-CM | POA: Insufficient documentation

## 2016-05-06 DIAGNOSIS — N1330 Unspecified hydronephrosis: Secondary | ICD-10-CM | POA: Diagnosis not present

## 2016-05-06 DIAGNOSIS — Z808 Family history of malignant neoplasm of other organs or systems: Secondary | ICD-10-CM | POA: Diagnosis not present

## 2016-05-06 LAB — URIC ACID: Uric Acid, Serum: 6.1 mg/dL (ref 2.3–6.6)

## 2016-05-06 LAB — BASIC METABOLIC PANEL
Anion gap: 10 (ref 5–15)
BUN: 29 mg/dL — ABNORMAL HIGH (ref 6–20)
CO2: 26 mmol/L (ref 22–32)
Calcium: 9.2 mg/dL (ref 8.9–10.3)
Chloride: 96 mmol/L — ABNORMAL LOW (ref 101–111)
Creatinine, Ser: 1.13 mg/dL — ABNORMAL HIGH (ref 0.44–1.00)
GFR calc Af Amer: 50 mL/min — ABNORMAL LOW (ref >60)
GFR calc non Af Amer: 43 mL/min — ABNORMAL LOW (ref >60)
Glucose, Bld: 120 mg/dL — ABNORMAL HIGH (ref 65–99)
Potassium: 3.1 mmol/L — ABNORMAL LOW (ref 3.5–5.1)
Sodium: 132 mmol/L — ABNORMAL LOW (ref 135–145)

## 2016-05-07 ENCOUNTER — Telehealth: Payer: Self-pay | Admitting: Hematology and Oncology

## 2016-05-07 MED ORDER — POTASSIUM CHLORIDE ER 10 MEQ PO TBCR: 10.0000 meq | EXTENDED_RELEASE_TABLET | Freq: Two times a day (BID) | ORAL | 0 refills | Status: DC

## 2016-05-07 NOTE — Telephone Encounter (Signed)
Re:  Hypokalemia  I spoke with the patient's daughter regarding her low potasium.  She denied any diarrhea or diuretic usage.  A prescription for potasium 10 meq BID was sent to her pharmacy.  Lequita Asal, MD

## 2016-05-08 ENCOUNTER — Encounter: Payer: Self-pay | Admitting: Hematology and Oncology

## 2016-05-08 NOTE — Progress Notes (Signed)
[facility] day:  05/09/16  Chief Complaint: Kristy Solomon is a 82 y.o. female with lymphoplasmacytic lymphoma who is seen for assessment on day 14 of Ibrutinib.  HPI: The patient was last seen in the medical oncology clinic on 04/25/2016.  At that time, she was doing well.  Scans revealed a partial response to chlorambucil and prednisone.  We discussed switch in therapy to ibrutinib.  She underwent chemotherapy teaching.  Prednisone was decreased from 5 mg a day to 5 mg QOD on 04/30/2016.  She has continued her allopurinol.  Restaging labs on 04/25/2016 revealed an IgM > 5850 mg/dL (stable).  SPEP revealed a 4.5 gm/dL spike (previously 5 gm/dL).  Labs on 05/06/2016 revealed a potassium of 3.1.  She was contacted regarding oral potassium supplementation.  A prescription was sent to her pharmacy.  Symptomatically, she is doing well.  She denies any side effects associated with Ibrutinib.  She states "I can't feel the lumps".  She has been taking her prednisone 5 mg QOD (last taken 05/08/2016).  She continues her allopurinol.   Past Medical History:  Diagnosis Date  . 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 Hyperlipidemia   . Hypertension   . Malignant lymphoma, lymphoplasmacytic ([place]) 12/27/2013  . Renal insufficiency     Past Surgical History:  Procedure Laterality Date  . ABDOMINAL HYSTERECTOMY    . HEMORRHOID SURGERY      Family History  Problem Relation Age of Onset  . Cancer Brother     throat ca  . Cancer Brother     bone cancer    Social History:  reports that she has never smoked. She has never used smokeless tobacco. She reports that she does not drink alcohol or use drugs.  She lives in [place] with her grandson and his wife. Her emergency contact is Cecille Po, her grandaughter- [phone]).  [name]'s phone number [phone]).  She is accompanied by her daughter today.  Allergies: No Known  Allergies  Current Medications: Current Outpatient Prescriptions  Medication Sig Dispense Refill  . allopurinol (ZYLOPRIM) 100 MG tablet Take 2 tablets (200 mg total) by mouth daily. 60 tablet 1  . atorvastatin (LIPITOR) 20 MG tablet Take 20 mg by mouth daily.    . ferrous sulfate 325 (65 FE) MG EC tablet Take 1 tablet by mouth a day with orange juice or vitamin C 30 tablet 1  . folic acid (FOLVITE) 1 MG tablet take 1 tablet by mouth once daily 30 tablet 1  . ibuprofen (ADVIL,MOTRIN) 600 MG tablet Take 600 mg by mouth every 6 (six) hours as needed.     . IMBRUVICA 140 MG capsul Take 3 capsules by mouth daily. Reported on 04/18/2016    . lisinopril-hydrochlorothiazide (PRINZIDE,ZESTORETIC) 20-12.5 MG per tablet Take 1 tablet by mouth daily.    . metoprolol tartrate (LOPRESSOR) 25 MG tablet Take 25 mg by mouth 2 (two) times daily.  0  . omeprazole (PRILOSEC) 20 MG capsule Take 20 mg by mouth daily.  0  . ondansetron (ZOFRAN) 4 MG tablet Take 1 tablet (4 mg total) by mouth every 8 (eight) hours as needed for nausea or vomiting. 20 tablet 1  . potassium chloride (K-DUR) 10 MEQ tablet Take 1 tablet (10 mEq total) by mouth 2 (two) times daily. 30 tablet 0  . predniSONE (DELTASONE) 5 MG tablet Take 1 tablet (5 mg total) by mouth daily with breakfast. 20 tablet 0  . chlorambucil (LEUKERAN) 2  MG tablet Take 3 tablets (6 mg total) by mouth daily. Give on an empty stomach 1 hour before or 2 hours after meals. 21 tablet 0  . polyethylene glycol (MIRALAX / GLYCOLAX) packet Take 17 g by mouth daily as needed for mild constipation. (Patient not taking: Reported on 05/09/2016) 14 each 1  . predniSONE (DELTASONE) 20 MG tablet Take 2 tablets (40 mg total) by mouth daily with breakfast. (Patient not taking: Reported on 05/09/2016) 30 tablet 0   No current facility-administered medications for this visit.    Facility-Administered Medications Ordered in Other Visits  Medication Dose Route Frequency Provider Last Rate  Last Dose  . cyanocobalamin ((VITAMIN B-12)) injection 1,000 mcg  1,000 mcg Intramuscular Once Lequita Asal, MD        Review of Systems:  GENERAL: Feels "good". No fevers or sweats. Weight up 2 pounds since last visit. PERFORMANCE STATUS (ECOG): 2 HEENT: No visual changes, runny nose, sore throat, mouth sores or tenderness. Lungs:  No shortness of breath or cough.  No wheezing.  No hemoptysis. Cardiac: No chest pain, palpitations, orthopnea, or PND. GI:  Eating well.  No nausea, vomiting, diarrhea, constipation, melena or hematochezia. GU: No urgency, frequency, dysuria, or hematuria. Musculoskeletal: No back pain. No joint pain. No muscle tenderness. Extremities: No pain or swelling. Skin: Nodules in skin associated with disease, "can't feel". No rashes. Neuro: No headache, numbness or weakness, balance or coordination issues. Endocrine: No diabetes, thyroid issues, hot flashes or night sweats. Psych:No mood changes, depression or anxiety. Pain: No pain. Review of systems: All other systems reviewed and found to be negative.  Physical Exam: Blood pressure (!) 159/68, pulse (!) 57, temperature 98.8 F (37.1 C), temperature source Tympanic, resp. rate 18, weight 172 lb 6.4 oz (78.2 kg). GENERAL: Elderly woman sitting comfortably in the exam room in clinic in no acute distress.  She gets onto the table easily. MENTAL STATUS: Alert and oriented to person, place and time. HEAD: Wearing a sparkled cap.  Brown hair. Normocephalic, atraumatic, face symmetric, no Cushingoid features. EYES: Brown eyes. Pupils equal round and reactive to light and accomodation. No conjunctivitis or scleral icterus. ENT: Oropharynx clear without lesion. Tongue normal. Mucous membranes moist.  RESPIRATORY: Normal respiratory excursion. No rales, wheezes or rhonchi. CARDIOVASCULAR: Regular rate and rhythm without murmur, rub or gallop. ABDOMEN: Soft, non-tender with active  bowel sounds and no appreciable hepatosplenomegaly.   SKIN: Scattered nodules in subcutaneous tissue.  Scalp nodule resolved.  Multiple back nodules 2 cm.  Largest nodule right flank (3 cm). EXTREMITIES: No edema, no skin discoloration or tenderness. No palpable cords. LYMPH NODES: No palpable cervical, supraclavicular, axillary or inguinal adenopathy  NEUROLOGICAL: Unremarkable. PSYCH: Appropriate   Appointment on 05/09/2016  Component Date Value Ref Range Status  . WBC 05/09/2016 5.2  3.6 - 11.0 K/uL Final  . RBC 05/09/2016 4.89  3.80 - 5.20 MIL/uL Final  . Hemoglobin 05/09/2016 11.5* 12.0 - 16.0 g/dL Final  . HCT 05/09/2016 34.9* 35.0 - 47.0 % Final  . MCV 05/09/2016 71.4* 80.0 - 100.0 fL Final  . MCH 05/09/2016 23.6* 26.0 - 34.0 pg Final  . MCHC 05/09/2016 33.1  32.0 - 36.0 g/dL Final  . RDW 05/09/2016 24.5* 11.5 - 14.5 % Final  . Platelets 05/09/2016 87* 150 - 440 K/uL Final  . Neutrophils Relative % 05/09/2016 29%  % Final  . Neutro Abs 05/09/2016 1.5  1.4 - 6.5 K/uL Final  . Lymphocytes Relative 05/09/2016 51%  % Final  .  Lymphs Abs 05/09/2016 2.7  1.0 - 3.6 K/uL Final  . Monocytes Relative 05/09/2016 17%  % Final  . Monocytes Absolute 05/09/2016 0.9  0.2 - 0.9 K/uL Final  . Eosinophils Relative 05/09/2016 2%  % Final  . Eosinophils Absolute 05/09/2016 0.1  0 - 0.7 K/uL Final  . Basophils Relative 05/09/2016 1%  % Final  . Basophils Absolute 05/09/2016 0.0  0 - 0.1 K/uL Final  . Sodium 05/09/2016 133* 135 - 145 mmol/L Final  . Potassium 05/09/2016 3.5  3.5 - 5.1 mmol/L Final  . Chloride 05/09/2016 99* 101 - 111 mmol/L Final  . CO2 05/09/2016 27  22 - 32 mmol/L Final  . Glucose, Bld 05/09/2016 114* 65 - 99 mg/dL Final  . BUN 05/09/2016 30* 6 - 20 mg/dL Final  . Creatinine, Ser 05/09/2016 1.40* 0.44 - 1.00 mg/dL Final  . Calcium 05/09/2016 8.9  8.9 - 10.3 mg/dL Final  . Total Protein 05/09/2016 9.5* 6.5 - 8.1 g/dL Final  . Albumin 05/09/2016 2.8* 3.5 - 5.0 g/dL Final   . AST 05/09/2016 15  15 - 41 U/L Final  . ALT 05/09/2016 12* 14 - 54 U/L Final  . Alkaline Phosphatase 05/09/2016 31* 38 - 126 U/L Final  . Total Bilirubin 05/09/2016 0.5  0.3 - 1.2 mg/dL Final  . GFR calc non Af Amer 05/09/2016 33* >60 mL/min Final  . GFR calc Af Amer 05/09/2016 39* >60 mL/min Final   Comment: (NOTE) The eGFR has been calculated using the CKD EPI equation. This calculation has not been validated in all clinical situations. eGFR's persistently <60 mL/min signify possible Chronic Kidney Disease.   . Anion gap 05/09/2016 7  5 - 15 Final    Assessment:  PETER DAQUILA is a 82 y.o. 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 She has massive subcutaneous deposits, pulmonary nodules, involvement of left kidney, and bowel. Disease was unresponsive to Rituxan in 2014.   Chest, abdomen, and pelvic CT scan on 03/17/2016 revealed progression of diffuse lymphoma with subcutaneous deposits throughout the subcutaneous fat of the chest, abdomen,and pelvis. There were multiple nodular infiltrates throughout the lungs, likely due to lymphoma but could also represent infection or atypical infection. There was massive retroperitoneal lymphadenopathy with diffuse retroperitoneal, mesenteric, and pelvic lymphadenopathy. There was wall thickening and pelvicsmall bowel probably representing lymphomas involvement. There was probable direct invasion of the left kidney.  Biopsy of the dominant SQ nodal conglomeration around the right flank on 03/18/2016 revealed a persistent low-grade CD5 positive B-cell lymphoma.  Flow cytometry was positive for CD20, CD79a, CD5, CD138. Neoplastic cells were positive for kappa and cyclin D1.  CD 23 was negative.   Additional molecular markers are pending.  Bone marrow aspirate and biopsy on 03/18/2016 revealed no evidence of lymphoma or plasma cell neoplasm (0.2% kappa monoclonal plasma cells by flow analysis).  Marrow  was hypercellular for age (50-60%) with trilineage hematopoiesis and no increased blasts. There was mild to moderate widespread increase in reticulin fibers. There was no storage iron detected.  The following studies were abnormal:  SPEP revealed a 5.0 gm/dl monoclonal spike.  IgG was 696, IgA 187, and IgM > 5850.  Beta2-microglobulin was 9.6 (0.6-2.4).  Serum viscosity was 5.5 (1.6-1.9).  Uric acid was 11.1 on 03/19/2016.   SPEP was 4.5 gm/dL and IgM > 5850 on 04/25/2016.  Normal studies included:  hepatitis B surface antigen, hepatitis B core antibody total, hepatitis C antibody, HIV testing, and LDH (123).  G6PD assay was 9.2 (  4.6-13.5).   She has iron deficiency (ferritin 27; iron saturation 11%), B12 deficiency (147), and folate deficiency (5.9).  She has chronic renal insufficiency (BUN was 30-35, creatinine was 1.38 - 1.80 with a GFR 29-39 ml/min). She has received B12 weekly (began 03/28/2016; last 04/18/2016).  She is on folic acid.   She received 1 week of chlorambucil with a tapering dose of prednisone (began 03/30/2016).  She is currently day 14 of ibrutinib (began 04/26/2016).  She has mild thrombocytopenia (87,000).  Chest, abdomen, and pelvic CT scan on 04/21/2016 revealed a partial response to therapy.  There were bilateral pulmonary nodules/masses, measuring up to 5.6 cm in the right lower lobe (improved).  There was trace left pleural effusion.  There was a 12.7 cm left renal/perirenal mass (decreased) with associated mild left hydronephrosis.  Retroperitoneal/left pelvic lymphadenopathy was mildly decreased.  Multifocal subcutaneous nodules, measuring up to 4.5 cm in the left lower anterior abdominal wall were stable to mildly improved.  Symptomatically, her energy level is good. SQ nodules continue to improve.  She has no evidence of tumor lysis syndrome.  She denies any hyperviscosity symptoms.  Uric acid is 5.9.  Plan: 1.  Labs today:  CBC with diff, CMP, uric acid, folate. 2.   Continue prednisone 5 mg QOD.  Discuss holding dose and check cortisol level at next blood draw. 3.  Continue ibutinib 420 mg a day.   4.  Continue allopurinol. 5.  Continue folic acid. 6.  Continue B12 monthly (09/18, 10/16, 11/13, 12/11). 7.  Continue ferrous sulfate.  Goal ferritin 100. 8.  RTC on 05/16/2016 early AM for labs (CBC with diff, BMP, uric acid, cortisol) 9.  RTC on 05/23/2016 for MD assess, labs (CBC with diff, CMP, LDH, uric acid) + B12   Lequita Asal, MD  05/09/2016, 4:14 PM

## 2016-05-09 ENCOUNTER — Inpatient Hospital Stay (HOSPITAL_BASED_OUTPATIENT_CLINIC_OR_DEPARTMENT_OTHER): Payer: Medicare HMO | Admitting: Hematology and Oncology

## 2016-05-09 ENCOUNTER — Other Ambulatory Visit: Payer: Self-pay | Admitting: *Deleted

## 2016-05-09 ENCOUNTER — Telehealth: Payer: Self-pay | Admitting: *Deleted

## 2016-05-09 ENCOUNTER — Encounter: Payer: Self-pay | Admitting: Hematology and Oncology

## 2016-05-09 ENCOUNTER — Inpatient Hospital Stay: Payer: Medicare HMO

## 2016-05-09 VITALS — BP 159/68 | HR 57 | Temp 98.8°F | Resp 18 | Wt 172.4 lb

## 2016-05-09 DIAGNOSIS — R109 Unspecified abdominal pain: Secondary | ICD-10-CM

## 2016-05-09 DIAGNOSIS — C8300 Small cell B-cell lymphoma, unspecified site: Secondary | ICD-10-CM

## 2016-05-09 DIAGNOSIS — R319 Hematuria, unspecified: Secondary | ICD-10-CM | POA: Diagnosis not present

## 2016-05-09 DIAGNOSIS — D509 Iron deficiency anemia, unspecified: Secondary | ICD-10-CM

## 2016-05-09 DIAGNOSIS — E790 Hyperuricemia without signs of inflammatory arthritis and tophaceous disease: Secondary | ICD-10-CM

## 2016-05-09 DIAGNOSIS — Z79899 Other long term (current) drug therapy: Secondary | ICD-10-CM

## 2016-05-09 DIAGNOSIS — Z7951 Long term (current) use of inhaled steroids: Secondary | ICD-10-CM

## 2016-05-09 DIAGNOSIS — I10 Essential (primary) hypertension: Secondary | ICD-10-CM

## 2016-05-09 DIAGNOSIS — E785 Hyperlipidemia, unspecified: Secondary | ICD-10-CM | POA: Diagnosis not present

## 2016-05-09 DIAGNOSIS — N1330 Unspecified hydronephrosis: Secondary | ICD-10-CM | POA: Diagnosis not present

## 2016-05-09 DIAGNOSIS — R918 Other nonspecific abnormal finding of lung field: Secondary | ICD-10-CM | POA: Diagnosis not present

## 2016-05-09 DIAGNOSIS — Z808 Family history of malignant neoplasm of other organs or systems: Secondary | ICD-10-CM

## 2016-05-09 DIAGNOSIS — N2889 Other specified disorders of kidney and ureter: Secondary | ICD-10-CM

## 2016-05-09 DIAGNOSIS — E538 Deficiency of other specified B group vitamins: Secondary | ICD-10-CM

## 2016-05-09 LAB — COMPREHENSIVE METABOLIC PANEL
ALT: 12 U/L — ABNORMAL LOW (ref 14–54)
AST: 15 U/L (ref 15–41)
Albumin: 2.8 g/dL — ABNORMAL LOW (ref 3.5–5.0)
Alkaline Phosphatase: 31 U/L — ABNORMAL LOW (ref 38–126)
Anion gap: 7 (ref 5–15)
BUN: 30 mg/dL — ABNORMAL HIGH (ref 6–20)
CO2: 27 mmol/L (ref 22–32)
Calcium: 8.9 mg/dL (ref 8.9–10.3)
Chloride: 99 mmol/L — ABNORMAL LOW (ref 101–111)
Creatinine, Ser: 1.4 mg/dL — ABNORMAL HIGH (ref 0.44–1.00)
GFR calc Af Amer: 39 mL/min — ABNORMAL LOW (ref >60)
GFR calc non Af Amer: 33 mL/min — ABNORMAL LOW (ref >60)
Glucose, Bld: 114 mg/dL — ABNORMAL HIGH (ref 65–99)
Potassium: 3.5 mmol/L (ref 3.5–5.1)
Sodium: 133 mmol/L — ABNORMAL LOW (ref 135–145)
Total Bilirubin: 0.5 mg/dL (ref 0.3–1.2)
Total Protein: 9.5 g/dL — ABNORMAL HIGH (ref 6.5–8.1)

## 2016-05-09 LAB — CBC WITH DIFFERENTIAL/PLATELET
Basophils Absolute: 0 10*3/uL (ref 0–0.1)
Basophils Relative: 1 %
Eosinophils Absolute: 0.1 10*3/uL (ref 0–0.7)
Eosinophils Relative: 2 %
HCT: 34.9 % — ABNORMAL LOW (ref 35.0–47.0)
Hemoglobin: 11.5 g/dL — ABNORMAL LOW (ref 12.0–16.0)
Lymphocytes Relative: 51 %
Lymphs Abs: 2.7 10*3/uL (ref 1.0–3.6)
MCH: 23.6 pg — ABNORMAL LOW (ref 26.0–34.0)
MCHC: 33.1 g/dL (ref 32.0–36.0)
MCV: 71.4 fL — ABNORMAL LOW (ref 80.0–100.0)
Monocytes Absolute: 0.9 10*3/uL (ref 0.2–0.9)
Monocytes Relative: 17 %
Neutro Abs: 1.5 10*3/uL (ref 1.4–6.5)
Neutrophils Relative %: 29 %
Platelets: 87 10*3/uL — ABNORMAL LOW (ref 150–440)
RBC: 4.89 MIL/uL (ref 3.80–5.20)
RDW: 24.5 % — ABNORMAL HIGH (ref 11.5–14.5)
WBC: 5.2 10*3/uL (ref 3.6–11.0)

## 2016-05-09 LAB — FOLATE: Folate: 77.5 ng/mL (ref >5.9)

## 2016-05-09 LAB — CORTISOL: Cortisol, Plasma: 8.6 ug/dL

## 2016-05-09 LAB — URIC ACID: Uric Acid, Serum: 5.9 mg/dL (ref 2.3–6.6)

## 2016-05-09 NOTE — Progress Notes (Signed)
Patient is here for a follow up, she has no complaints   Asking how much longer the patient will be on prednisone?

## 2016-05-09 NOTE — Telephone Encounter (Signed)
-----   Message from Lequita Asal, MD sent at 05/06/2016  5:05 PM EDT ----- Regarding: Please call patient  Is she taking potassium?  M  ----- Message ----- From: Interface, Lab In [place] Sent: 05/06/2016  10:35 AM To: Lequita Asal, MD

## 2016-05-09 NOTE — Telephone Encounter (Signed)
Per dr Mike Gip she called pt herself late Friday evening and told pt to tak 1 pill bid and sent potassium rx to her pharmacy

## 2016-05-13 ENCOUNTER — Inpatient Hospital Stay: Payer: Medicare HMO

## 2016-05-13 ENCOUNTER — Inpatient Hospital Stay (HOSPITAL_BASED_OUTPATIENT_CLINIC_OR_DEPARTMENT_OTHER): Payer: Medicare HMO | Admitting: Hematology and Oncology

## 2016-05-13 ENCOUNTER — Telehealth: Payer: Self-pay | Admitting: *Deleted

## 2016-05-13 VITALS — BP 178/69 | HR 66 | Temp 98.1°F | Resp 18 | Wt 157.8 lb

## 2016-05-13 DIAGNOSIS — H538 Other visual disturbances: Secondary | ICD-10-CM

## 2016-05-13 DIAGNOSIS — K921 Melena: Secondary | ICD-10-CM

## 2016-05-13 DIAGNOSIS — R319 Hematuria, unspecified: Secondary | ICD-10-CM

## 2016-05-13 DIAGNOSIS — C8300 Small cell B-cell lymphoma, unspecified site: Secondary | ICD-10-CM

## 2016-05-13 DIAGNOSIS — Z79899 Other long term (current) drug therapy: Secondary | ICD-10-CM

## 2016-05-13 DIAGNOSIS — I10 Essential (primary) hypertension: Secondary | ICD-10-CM

## 2016-05-13 DIAGNOSIS — R109 Unspecified abdominal pain: Secondary | ICD-10-CM

## 2016-05-13 DIAGNOSIS — R590 Localized enlarged lymph nodes: Secondary | ICD-10-CM | POA: Diagnosis not present

## 2016-05-13 DIAGNOSIS — Z7951 Long term (current) use of inhaled steroids: Secondary | ICD-10-CM

## 2016-05-13 DIAGNOSIS — R202 Paresthesia of skin: Secondary | ICD-10-CM

## 2016-05-13 DIAGNOSIS — R918 Other nonspecific abnormal finding of lung field: Secondary | ICD-10-CM

## 2016-05-13 DIAGNOSIS — E785 Hyperlipidemia, unspecified: Secondary | ICD-10-CM

## 2016-05-13 DIAGNOSIS — D696 Thrombocytopenia, unspecified: Secondary | ICD-10-CM

## 2016-05-13 DIAGNOSIS — R200 Anesthesia of skin: Secondary | ICD-10-CM

## 2016-05-13 DIAGNOSIS — Z808 Family history of malignant neoplasm of other organs or systems: Secondary | ICD-10-CM

## 2016-05-13 DIAGNOSIS — D6481 Anemia due to antineoplastic chemotherapy: Secondary | ICD-10-CM

## 2016-05-13 DIAGNOSIS — N2889 Other specified disorders of kidney and ureter: Secondary | ICD-10-CM

## 2016-05-13 DIAGNOSIS — N1330 Unspecified hydronephrosis: Secondary | ICD-10-CM

## 2016-05-13 LAB — CBC WITH DIFFERENTIAL/PLATELET
Basophils Absolute: 0 10*3/uL (ref 0–0.1)
Basophils Relative: 1 %
Eosinophils Absolute: 0.1 10*3/uL (ref 0–0.7)
Eosinophils Relative: 2 %
HCT: 36.2 % (ref 35.0–47.0)
Hemoglobin: 12 g/dL (ref 12.0–16.0)
Lymphocytes Relative: 44 %
Lymphs Abs: 2 10*3/uL (ref 1.0–3.6)
MCH: 23.5 pg — ABNORMAL LOW (ref 26.0–34.0)
MCHC: 33 g/dL (ref 32.0–36.0)
MCV: 71.3 fL — ABNORMAL LOW (ref 80.0–100.0)
Monocytes Absolute: 0.7 10*3/uL (ref 0.2–0.9)
Monocytes Relative: 17 %
Neutro Abs: 1.6 10*3/uL (ref 1.4–6.5)
Neutrophils Relative %: 36 %
Platelets: 78 10*3/uL — ABNORMAL LOW (ref 150–400)
RBC: 5.08 MIL/uL (ref 3.80–5.20)
RDW: 24.7 % — ABNORMAL HIGH (ref 11.5–14.5)
WBC: 4.4 10*3/uL (ref 3.6–11.0)

## 2016-05-13 LAB — URINALYSIS COMPLETE WITH MICROSCOPIC (ARMC ONLY)
Bacteria, UA: NONE SEEN
Bilirubin Urine: NEGATIVE
Glucose, UA: NEGATIVE mg/dL
Ketones, ur: NEGATIVE mg/dL
Nitrite: NEGATIVE
Protein, ur: 100 mg/dL — AB
Specific Gravity, Urine: 1.013 (ref 1.005–1.030)
pH: 6 (ref 5.0–8.0)

## 2016-05-13 LAB — COMPREHENSIVE METABOLIC PANEL
ALT: 13 U/L — ABNORMAL LOW (ref 14–54)
AST: 15 U/L (ref 15–41)
Albumin: 2.9 g/dL — ABNORMAL LOW (ref 3.5–5.0)
Alkaline Phosphatase: 34 U/L — ABNORMAL LOW (ref 38–126)
Anion gap: 8 (ref 5–15)
BUN: 32 mg/dL — ABNORMAL HIGH (ref 6–20)
CO2: 26 mmol/L (ref 22–32)
Calcium: 9.1 mg/dL (ref 8.9–10.3)
Chloride: 100 mmol/L — ABNORMAL LOW (ref 101–111)
Creatinine, Ser: 1.25 mg/dL — ABNORMAL HIGH (ref 0.44–1.00)
GFR calc Af Amer: 44 mL/min — ABNORMAL LOW (ref >60)
GFR calc non Af Amer: 38 mL/min — ABNORMAL LOW (ref >60)
Glucose, Bld: 94 mg/dL (ref 65–99)
Potassium: 3.8 mmol/L (ref 3.5–5.1)
Sodium: 134 mmol/L — ABNORMAL LOW (ref 135–145)
Total Bilirubin: 0.7 mg/dL (ref 0.3–1.2)
Total Protein: 9.9 g/dL — ABNORMAL HIGH (ref 6.5–8.1)

## 2016-05-13 LAB — URIC ACID: Uric Acid, Serum: 5.8 mg/dL (ref 2.3–6.6)

## 2016-05-13 NOTE — Telephone Encounter (Signed)
Daughter called back and will bring patient in for this appt

## 2016-05-13 NOTE — Progress Notes (Signed)
[facility] day:  05/13/16  Chief Complaint: Kristy Solomon is a 82 y.o. female with lymphoplasmacytic lymphoma who is seen for sick call visit on day18 of Ibrutinib.  HPI: The patient was last seen in the medical oncology clinic on 05/09/2016.  At that time, she was doing well.  She voiced no concerns.  Subcutaneous nodules were improving.  Labs were stable.  She notes several new symptoms.  She notes tingling in her toes like concrete.  Her vision is a little blurry.  She has a change in taste sensation.  She notes blood in her urine.  She has had a pain in her stomach like a "nagging" sensation.  She continues to eat.  She has not lost any weight.     Past Medical History:  Diagnosis Date  . 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 Hyperlipidemia   . Hypertension   . Malignant lymphoma, lymphoplasmacytic ([place]) 12/27/2013  . Renal insufficiency     Past Surgical History:  Procedure Laterality Date  . ABDOMINAL HYSTERECTOMY    . HEMORRHOID SURGERY      Family History  Problem Relation Age of Onset  . Cancer Brother     throat ca  . Cancer Brother     bone cancer    Social History:  reports that she has never smoked. She has never used smokeless tobacco. She reports that she does not drink alcohol or use drugs.  She lives in [place] with her grandson and his wife. Her emergency contact is Cecille Po, her grandaughter- [phone]).  [name]'s phone number [phone]).  She is accompanied by a family member today.  Allergies: No Known Allergies  Current Medications: Current Outpatient Prescriptions  Medication Sig Dispense Refill  . allopurinol (ZYLOPRIM) 100 MG tablet Take 2 tablets (200 mg total) by mouth daily. 60 tablet 1  . atorvastatin (LIPITOR) 20 MG tablet Take 20 mg by mouth daily.    . chlorambucil (LEUKERAN) 2 MG tablet Take 3 tablets (6 mg total) by mouth daily. Give on an empty stomach 1 hour before or 2  hours after meals. 21 tablet 0  . ferrous sulfate 325 (65 FE) MG EC tablet Take 1 tablet by mouth a day with orange juice or vitamin C 30 tablet 1  . folic acid (FOLVITE) 1 MG tablet take 1 tablet by mouth once daily 30 tablet 1  . ibuprofen (ADVIL,MOTRIN) 600 MG tablet Take 600 mg by mouth every 6 (six) hours as needed.     . IMBRUVICA 140 MG capsul Take 3 capsules by mouth daily. Reported on 04/18/2016    . lisinopril-hydrochlorothiazide (PRINZIDE,ZESTORETIC) 20-12.5 MG per tablet Take 1 tablet by mouth daily.    . metoprolol tartrate (LOPRESSOR) 25 MG tablet Take 25 mg by mouth 2 (two) times daily.  0  . omeprazole (PRILOSEC) 20 MG capsule Take 20 mg by mouth daily.  0  . ondansetron (ZOFRAN) 4 MG tablet Take 1 tablet (4 mg total) by mouth every 8 (eight) hours as needed for nausea or vomiting. 20 tablet 1  . polyethylene glycol (MIRALAX / GLYCOLAX) packet Take 17 g by mouth daily as needed for mild constipation. 14 each 1  . potassium chloride (K-DUR) 10 MEQ tablet Take 1 tablet (10 mEq total) by mouth 2 (two) times daily. 30 tablet 0  . predniSONE (DELTASONE) 20 MG tablet Take 2 tablets (40 mg total) by mouth daily with breakfast. 30 tablet 0  . predniSONE (  DELTASONE) 5 MG tablet Take 1 tablet (5 mg total) by mouth daily with breakfast. 20 tablet 0   No current facility-administered medications for this visit.    Facility-Administered Medications Ordered in Other Visits  Medication Dose Route Frequency Provider Last Rate Last Dose  . cyanocobalamin ((VITAMIN B-12)) injection 1,000 mcg  1,000 mcg Intramuscular Once Lequita Asal, MD        Review of Systems:  GENERAL: Feels "ok". No fevers or sweats. Weight stable. PERFORMANCE STATUS (ECOG): 2 HEENT: Blurry vision.  Change in taste sensation.  No runny nose, sore throat, mouth sores or tenderness. Lungs:  No shortness of breath or cough.  No wheezing.  No hemoptysis. Cardiac: No chest pain, palpitations, orthopnea, or  PND. GI:  Nagging pain in stomach.  Eating.  No nausea, vomiting, diarrhea, constipation, melena or hematochezia. GU: Blood in urine.  No urgency, frequency, or dysuria. Musculoskeletal: No back pain. No joint pain. No muscle tenderness. Extremities: No pain or swelling. Skin: Nodules in skin associated with disease, diminishing. No rashes. Neuro: Tingling in toes.  No headache, numbness or weakness, balance or coordination issues. Endocrine: No diabetes, thyroid issues, hot flashes or night sweats. Psych:No mood changes, depression or anxiety. Pain: No pain. Review of systems: All other systems reviewed and found to be negative.  Physical Exam: Blood pressure (!) 178/69, pulse 66, temperature 98.1 F (36.7 C), temperature source Tympanic, resp. rate 18, weight 157 lb 13.6 oz (71.6 kg). GENERAL: Elderly woman sitting comfortably in the exam room in clinic in no acute distress. MENTAL STATUS: Alert and oriented to person, place and time. HEAD: Brown hair. Normocephalic, atraumatic, face symmetric, no Cushingoid features. EYES: Brown eyes. Pupils equal round and reactive to light and accomodation. No conjunctivitis or scleral icterus. ENT: Oropharynx clear without lesion. Tongue normal. Mucous membranes moist.  RESPIRATORY: Normal respiratory excursion. No rales, wheezes or rhonchi. CARDIOVASCULAR: Regular rate and rhythm without murmur, rub or gallop. ABDOMEN: Soft, non-tender with active bowel sounds and no appreciable hepatosplenomegaly. No guarding or rebound tenderness.  SKIN: Scattered nodules in subcutaneous tissue.  Multiple back nodules < 2 cm.  EXTREMITIES: No edema, no skin discoloration or tenderness. No palpable cords. LYMPH NODES: No palpable cervical, supraclavicular, axillary or inguinal adenopathy  NEUROLOGICAL: Unremarkable. PSYCH: Appropriate   Appointment on 05/13/2016  Component Date Value Ref Range Status  . WBC 05/13/2016 4.4  3.6  - 11.0 K/uL Final  . RBC 05/13/2016 5.08  3.80 - 5.20 MIL/uL Final  . Hemoglobin 05/13/2016 12.0  12.0 - 16.0 g/dL Final  . HCT 05/13/2016 36.2  35.0 - 47.0 % Final  . MCV 05/13/2016 71.3* 80.0 - 100.0 fL Final  . MCH 05/13/2016 23.5* 26.0 - 34.0 pg Final  . MCHC 05/13/2016 33.0  32.0 - 36.0 g/dL Final  . RDW 05/13/2016 24.7* 11.5 - 14.5 % Final  . Platelets 05/13/2016 78* 150 - 400 K/uL Final  . Neutrophils Relative % 05/13/2016 36%  % Final  . Neutro Abs 05/13/2016 1.6  1.4 - 6.5 K/uL Final  . Lymphocytes Relative 05/13/2016 44%  % Final  . Lymphs Abs 05/13/2016 2.0  1.0 - 3.6 K/uL Final  . Monocytes Relative 05/13/2016 17%  % Final  . Monocytes Absolute 05/13/2016 0.7  0.2 - 0.9 K/uL Final  . Eosinophils Relative 05/13/2016 2%  % Final  . Eosinophils Absolute 05/13/2016 0.1  0 - 0.7 K/uL Final  . Basophils Relative 05/13/2016 1%  % Final  . Basophils Absolute 05/13/2016 0.0  0 - 0.1 K/uL Final  . Sodium 05/13/2016 134* 135 - 145 mmol/L Final  . Potassium 05/13/2016 3.8  3.5 - 5.1 mmol/L Final  . Chloride 05/13/2016 100* 101 - 111 mmol/L Final  . CO2 05/13/2016 26  22 - 32 mmol/L Final  . Glucose, Bld 05/13/2016 94  65 - 99 mg/dL Final  . BUN 05/13/2016 32* 6 - 20 mg/dL Final  . Creatinine, Ser 05/13/2016 1.25* 0.44 - 1.00 mg/dL Final  . Calcium 05/13/2016 9.1  8.9 - 10.3 mg/dL Final  . Total Protein 05/13/2016 9.9* 6.5 - 8.1 g/dL Final  . Albumin 05/13/2016 2.9* 3.5 - 5.0 g/dL Final  . AST 05/13/2016 15  15 - 41 U/L Final  . ALT 05/13/2016 13* 14 - 54 U/L Final  . Alkaline Phosphatase 05/13/2016 34* 38 - 126 U/L Final  . Total Bilirubin 05/13/2016 0.7  0.3 - 1.2 mg/dL Final  . GFR calc non Af Amer 05/13/2016 38* >60 mL/min Final  . GFR calc Af Amer 05/13/2016 44* >60 mL/min Final   Comment: (NOTE) The eGFR has been calculated using the CKD EPI equation. This calculation has not been validated in all clinical situations. eGFR's persistently <60 mL/min signify possible Chronic  Kidney Disease.   . Anion gap 05/13/2016 8  5 - 15 Final  . Uric Acid, Serum 05/13/2016 5.8  2.3 - 6.6 mg/dL Final    Assessment:  Kristy Solomon is a 82 y.o. 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 She has massive subcutaneous deposits, pulmonary nodules, involvement of left kidney, and bowel. Disease was unresponsive to Rituxan in 2014.   Chest, abdomen, and pelvic CT scan on 03/17/2016 revealed progression of diffuse lymphoma with subcutaneous deposits throughout the subcutaneous fat of the chest, abdomen,and pelvis. There were multiple nodular infiltrates throughout the lungs, likely due to lymphoma but could also represent infection or atypical infection. There was massive retroperitoneal lymphadenopathy with diffuse retroperitoneal, mesenteric, and pelvic lymphadenopathy. There was wall thickening and pelvicsmall bowel probably representing lymphomas involvement. There was probable direct invasion of the left kidney.  Biopsy of the dominant SQ nodal conglomeration around the right flank on 03/18/2016 revealed a persistent low-grade CD5 positive B-cell lymphoma.  Flow cytometry was positive for CD20, CD79a, CD5, CD138. Neoplastic cells were positive for kappa and cyclin D1.  CD 23 was negative.   Additional molecular markers are pending.  Bone marrow aspirate and biopsy on 03/18/2016 revealed no evidence of lymphoma or plasma cell neoplasm (0.2% kappa monoclonal plasma cells by flow analysis).  Marrow was hypercellular for age (50-60%) with trilineage hematopoiesis and no increased blasts. There was mild to moderate widespread increase in reticulin fibers. There was no storage iron detected.  The following studies were abnormal:  SPEP revealed a 5.0 gm/dl monoclonal spike.  IgG was 696, IgA 187, and IgM > 5850.  Beta2-microglobulin was 9.6 (0.6-2.4).  Serum viscosity was 5.5 (1.6-1.9).  Uric acid was 11.1 on 03/19/2016.     Normal studies  included:  hepatitis B surface antigen, hepatitis B core antibody total, hepatitis C antibody, HIV testing, and LDH (123).  G6PD assay was 9.2 (4.6-13.5).   She has iron deficiency (ferritin 27; iron saturation 11%), B12 deficiency (147), and folate deficiency (5.9).  She has chronic renal insufficiency (BUN was 30-35, creatinine was 1.38 - 1.80 with a GFR 29-39 ml/min). She has received B12 weekly (began 03/28/2016; last 04/18/2016).  She is on folic acid.   She received 1 week of chlorambucil with a  tapering dose of prednisone (began 03/30/2016).  She is currently day 18 of ibrutinib (began 04/26/2016).  She has mild thrombocytopenia (78,000).  SPEP was 4.5 gm/dL and IgM > 5850 on 04/25/2016.  Chest, abdomen, and pelvic CT scan on 04/21/2016 revealed a partial response to therapy.  There were bilateral pulmonary nodules/masses, measuring up to 5.6 cm in the right lower lobe (improved).  There was trace left pleural effusion.  There was a 12.7 cm left renal/perirenal mass (decreased) with associated mild left hydronephrosis.  Retroperitoneal/left pelvic lymphadenopathy was mildly decreased.  Multifocal subcutaneous nodules, measuring up to 4.5 cm in the left lower anterior abdominal wall were stable to mildly improved.  Symptomatically, she has blurry vision, mild abdominal discomfort, tingling in her toes, and hematuria.  Plan: 1.  Labs today:  CBC with diff, CMP,uric acid. 2.  Urinalysis and culture. 3.  Discuss current side effects.  Blurred vision seen in 10-15% of patients including dry eyes, abdominal discomfort (15-25%), and hematuria.  Hematuria may also be the result of a UTI from known left sided renal mass.  Discuss holding ibrutinib until symptoms resolve then decreasing dose by 1 pill to 280 mg a day.  Collect urine to rule out UTI. 4.  Change labs from Monday to Tuesday. 5.  Nurse call on Monday to assess symptoms 6.  RTC as previously scheduled   Lequita Asal, MD  05/13/2016,  2:27 PM

## 2016-05-13 NOTE — Progress Notes (Signed)
Patient has a CC of blood in her urine, she is also complaining of abdomen pain in the mid section and on the pain scale is was a 6/10. She mentions blurry vision, denies any bloody stools. She also has a bad taste in her mouth. Started a new medication about a month ago.

## 2016-05-13 NOTE — Telephone Encounter (Signed)
Having side effects from Imbruvica, Urine is blood red, vision is blurred, toes and feet numb/ funny feeling, abd pain, Reports stools are "black as soot" insomnia. Please advise

## 2016-05-13 NOTE — Telephone Encounter (Signed)
Per Dr Mike Gip, have patient come in this afternoon for CBC, CMP, URIC ACID, UA, URINE CULTURE and see md. I called pt who will see if her daughter can bring her in at 57. She will call me back

## 2016-05-14 LAB — URINE CULTURE

## 2016-05-16 ENCOUNTER — Inpatient Hospital Stay: Payer: Medicare HMO

## 2016-05-16 ENCOUNTER — Telehealth: Payer: Self-pay | Admitting: *Deleted

## 2016-05-16 ENCOUNTER — Other Ambulatory Visit: Payer: Self-pay | Admitting: *Deleted

## 2016-05-16 DIAGNOSIS — R319 Hematuria, unspecified: Secondary | ICD-10-CM

## 2016-05-16 DIAGNOSIS — R102 Pelvic and perineal pain: Secondary | ICD-10-CM

## 2016-05-16 NOTE — Telephone Encounter (Signed)
-----   Message from Lequita Asal, MD sent at 05/14/2016  4:06 PM EDT ----- Regarding: Patient neeeds urine recollected  Patient had hematuria on Friday. Multiple species in urine.   Needs recollection.  M  ----- Message ----- From: Interface, Lab In [place] Sent: 05/13/2016   1:50 PM To: Lequita Asal, MD

## 2016-05-16 NOTE — Telephone Encounter (Signed)
Called pt home twice and no answer first time, sec. Time pt was at md appt.  Called dollie pt's daughter and left her a message stating dr Mike Gip wanted me to check on her to see if she was feeling better.  Also the urine specimen came back as contamination which sometimes means she needs to clean better and staff needs to giver her instructions to clean before getting specimen.  If she could call me back to see how she is feeling and to set up repeat of urine testing and speak about dec. ibrutinib

## 2016-05-17 ENCOUNTER — Inpatient Hospital Stay: Payer: Medicare HMO

## 2016-05-17 DIAGNOSIS — R102 Pelvic and perineal pain: Secondary | ICD-10-CM

## 2016-05-17 DIAGNOSIS — C8300 Small cell B-cell lymphoma, unspecified site: Secondary | ICD-10-CM

## 2016-05-17 DIAGNOSIS — R319 Hematuria, unspecified: Secondary | ICD-10-CM

## 2016-05-17 LAB — URIC ACID: Uric Acid, Serum: 6 mg/dL (ref 2.3–6.6)

## 2016-05-17 LAB — COMPREHENSIVE METABOLIC PANEL
ALT: 17 U/L (ref 14–54)
AST: 16 U/L (ref 15–41)
Albumin: 3 g/dL — ABNORMAL LOW (ref 3.5–5.0)
Alkaline Phosphatase: 34 U/L — ABNORMAL LOW (ref 38–126)
Anion gap: 7 (ref 5–15)
BUN: 34 mg/dL — ABNORMAL HIGH (ref 6–20)
CO2: 25 mmol/L (ref 22–32)
Calcium: 9.1 mg/dL (ref 8.9–10.3)
Chloride: 102 mmol/L (ref 101–111)
Creatinine, Ser: 1.23 mg/dL — ABNORMAL HIGH (ref 0.44–1.00)
GFR calc Af Amer: 45 mL/min — ABNORMAL LOW (ref >60)
GFR calc non Af Amer: 39 mL/min — ABNORMAL LOW (ref >60)
Glucose, Bld: 91 mg/dL (ref 65–99)
Potassium: 3.8 mmol/L (ref 3.5–5.1)
Sodium: 134 mmol/L — ABNORMAL LOW (ref 135–145)
Total Bilirubin: 0.6 mg/dL (ref 0.3–1.2)
Total Protein: 9.4 g/dL — ABNORMAL HIGH (ref 6.5–8.1)

## 2016-05-17 LAB — URINALYSIS COMPLETE WITH MICROSCOPIC (ARMC ONLY)
Bacteria, UA: NONE SEEN
Bilirubin Urine: NEGATIVE
Glucose, UA: NEGATIVE mg/dL
Ketones, ur: NEGATIVE mg/dL
Nitrite: NEGATIVE
Protein, ur: 100 mg/dL — AB
Specific Gravity, Urine: 1.014 (ref 1.005–1.030)
pH: 6 (ref 5.0–8.0)

## 2016-05-17 LAB — CORTISOL: Cortisol, Plasma: 9.9 ug/dL

## 2016-05-18 ENCOUNTER — Telehealth: Payer: Self-pay | Admitting: *Deleted

## 2016-05-18 LAB — URINE CULTURE: Culture: NO GROWTH

## 2016-05-18 NOTE — Telephone Encounter (Signed)
Called pt's daughter, [name], twice with no answer. Left message with [name] regarding results and MD instructions. Instructed to call back if has further questions.

## 2016-05-18 NOTE — Telephone Encounter (Signed)
-----   Message from Lequita Asal, MD sent at 05/18/2016  9:11 AM EDT ----- Regarding: Please call patient's daughter  Cortisol level good. OK to stop steroids.  M  ----- Message ----- From: Interface, Lab In [place] Sent: 05/17/2016   9:16 AM To: Lequita Asal, MD

## 2016-05-19 ENCOUNTER — Other Ambulatory Visit: Payer: Self-pay | Admitting: *Deleted

## 2016-05-20 ENCOUNTER — Other Ambulatory Visit: Payer: Self-pay | Admitting: Hematology and Oncology

## 2016-05-23 ENCOUNTER — Inpatient Hospital Stay (HOSPITAL_BASED_OUTPATIENT_CLINIC_OR_DEPARTMENT_OTHER): Payer: Medicare HMO | Admitting: Hematology and Oncology

## 2016-05-23 ENCOUNTER — Encounter: Payer: Self-pay | Admitting: Hematology and Oncology

## 2016-05-23 ENCOUNTER — Inpatient Hospital Stay: Payer: Medicare HMO

## 2016-05-23 ENCOUNTER — Other Ambulatory Visit: Payer: Self-pay | Admitting: Hematology and Oncology

## 2016-05-23 VITALS — BP 159/69 | HR 53 | Temp 96.7°F | Resp 18 | Wt 159.8 lb

## 2016-05-23 DIAGNOSIS — H538 Other visual disturbances: Secondary | ICD-10-CM | POA: Diagnosis not present

## 2016-05-23 DIAGNOSIS — R319 Hematuria, unspecified: Secondary | ICD-10-CM

## 2016-05-23 DIAGNOSIS — C8300 Small cell B-cell lymphoma, unspecified site: Secondary | ICD-10-CM | POA: Diagnosis not present

## 2016-05-23 DIAGNOSIS — D696 Thrombocytopenia, unspecified: Secondary | ICD-10-CM | POA: Diagnosis not present

## 2016-05-23 DIAGNOSIS — R918 Other nonspecific abnormal finding of lung field: Secondary | ICD-10-CM

## 2016-05-23 DIAGNOSIS — E538 Deficiency of other specified B group vitamins: Secondary | ICD-10-CM

## 2016-05-23 DIAGNOSIS — R202 Paresthesia of skin: Secondary | ICD-10-CM

## 2016-05-23 DIAGNOSIS — E785 Hyperlipidemia, unspecified: Secondary | ICD-10-CM

## 2016-05-23 DIAGNOSIS — I10 Essential (primary) hypertension: Secondary | ICD-10-CM

## 2016-05-23 DIAGNOSIS — R200 Anesthesia of skin: Secondary | ICD-10-CM

## 2016-05-23 DIAGNOSIS — N2889 Other specified disorders of kidney and ureter: Secondary | ICD-10-CM

## 2016-05-23 DIAGNOSIS — R109 Unspecified abdominal pain: Secondary | ICD-10-CM

## 2016-05-23 DIAGNOSIS — Z7951 Long term (current) use of inhaled steroids: Secondary | ICD-10-CM

## 2016-05-23 DIAGNOSIS — Z808 Family history of malignant neoplasm of other organs or systems: Secondary | ICD-10-CM

## 2016-05-23 DIAGNOSIS — D6481 Anemia due to antineoplastic chemotherapy: Secondary | ICD-10-CM

## 2016-05-23 DIAGNOSIS — N1330 Unspecified hydronephrosis: Secondary | ICD-10-CM

## 2016-05-23 DIAGNOSIS — R590 Localized enlarged lymph nodes: Secondary | ICD-10-CM

## 2016-05-23 DIAGNOSIS — Z79899 Other long term (current) drug therapy: Secondary | ICD-10-CM

## 2016-05-23 LAB — CBC WITH DIFFERENTIAL/PLATELET
Basophils Absolute: 0.1 10*3/uL (ref 0–0.1)
Basophils Relative: 1 %
Eosinophils Absolute: 0.1 10*3/uL (ref 0–0.7)
Eosinophils Relative: 2 %
HCT: 36.6 % (ref 35.0–47.0)
Hemoglobin: 12 g/dL (ref 12.0–16.0)
Lymphocytes Relative: 52 %
Lymphs Abs: 2.7 10*3/uL (ref 1.0–3.6)
MCH: 23.6 pg — ABNORMAL LOW (ref 26.0–34.0)
MCHC: 32.7 g/dL (ref 32.0–36.0)
MCV: 72.2 fL — ABNORMAL LOW (ref 80.0–100.0)
Monocytes Absolute: 0.5 10*3/uL (ref 0.2–0.9)
Monocytes Relative: 10 %
Neutro Abs: 1.9 10*3/uL (ref 1.4–6.5)
Neutrophils Relative %: 35 %
Platelets: 79 10*3/uL — ABNORMAL LOW (ref 150–440)
RBC: 5.07 MIL/uL (ref 3.80–5.20)
RDW: 24.6 % — ABNORMAL HIGH (ref 11.5–14.5)
WBC: 5.3 10*3/uL (ref 3.6–11.0)

## 2016-05-23 LAB — COMPREHENSIVE METABOLIC PANEL
ALT: 16 U/L (ref 14–54)
AST: 16 U/L (ref 15–41)
Albumin: 3.1 g/dL — ABNORMAL LOW (ref 3.5–5.0)
Alkaline Phosphatase: 31 U/L — ABNORMAL LOW (ref 38–126)
Anion gap: 9 (ref 5–15)
BUN: 29 mg/dL — ABNORMAL HIGH (ref 6–20)
CO2: 25 mmol/L (ref 22–32)
Calcium: 9.3 mg/dL (ref 8.9–10.3)
Chloride: 100 mmol/L — ABNORMAL LOW (ref 101–111)
Creatinine, Ser: 1.11 mg/dL — ABNORMAL HIGH (ref 0.44–1.00)
GFR calc Af Amer: 51 mL/min — ABNORMAL LOW (ref >60)
GFR calc non Af Amer: 44 mL/min — ABNORMAL LOW (ref >60)
Glucose, Bld: 90 mg/dL (ref 65–99)
Potassium: 3.3 mmol/L — ABNORMAL LOW (ref 3.5–5.1)
Sodium: 134 mmol/L — ABNORMAL LOW (ref 135–145)
Total Bilirubin: 0.4 mg/dL (ref 0.3–1.2)
Total Protein: 9 g/dL — ABNORMAL HIGH (ref 6.5–8.1)

## 2016-05-23 LAB — URIC ACID: Uric Acid, Serum: 5.7 mg/dL (ref 2.3–6.6)

## 2016-05-23 MED ORDER — CYANOCOBALAMIN 1000 MCG/ML IJ SOLN
1000.0000 ug | Freq: Once | INTRAMUSCULAR | Status: AC
  Administered 2016-05-23: 1000 ug via INTRAMUSCULAR

## 2016-05-23 MED ORDER — POTASSIUM CHLORIDE ER 10 MEQ PO TBCR: 10.0000 meq | EXTENDED_RELEASE_TABLET | Freq: Every day | ORAL | 0 refills | Status: DC

## 2016-05-23 NOTE — Progress Notes (Signed)
Patient is here for follow up  

## 2016-05-23 NOTE — Progress Notes (Signed)
[facility] day:  05/23/16  Chief Complaint: Kristy Solomon is a 82 y.o. female with lymphoplasmacytic lymphoma who is seen for assessment on day 28 of Ibrutinib.  HPI: The patient was last seen in the medical oncology clinic on 05/13/2016.  At that time, she was seen for sick call visit.  She noted tingling in her toes like concrete.  Her vision was a little blurry.  She had a change in taste sensation.  She noted blood in her urine.  She had a pain in her stomach like a "nagging" sensation.   Labs were stable.  The etiology of her hematuria was unclear (possible infection or associated with left lymphomatous kidney mass).  A urinalysis and culture were obtained.  We discussed holding ibrutinib until her symptoms resolved.  Urinalysis revealed 3+ Hgb and too numerous to count RBCs/hpf.  Urine culture revealed multiple species.  Repeat urinalysis on 05/27/2016 was similar.  Urine culture revealed no growth.  During the interim, the patient's complaints from last visit have resolved.  She has continued to take her ibrutinib (never stopped).  She notes some stomach pain "near my belly button".  Urine is clear.  Bowel movements are normal.  She denies any nausea or vomiting.  She has had no fever.   Past Medical History:  Diagnosis Date  . 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 Hyperlipidemia   . Hypertension   . Malignant lymphoma, lymphoplasmacytic ([place]) 12/27/2013  . Renal insufficiency     Past Surgical History:  Procedure Laterality Date  . ABDOMINAL HYSTERECTOMY    . HEMORRHOID SURGERY      Family History  Problem Relation Age of Onset  . Cancer Brother     throat ca  . Cancer Brother     bone cancer    Social History:  reports that she has never smoked. She has never used smokeless tobacco. She reports that she does not drink alcohol or use drugs.  She lives in [place] with her grandson and his wife. Her emergency contact is  Cecille Po, her grandaughter- [phone]).  [name]'s phone number [phone]).  She is accompanied by her daughter today.  Allergies: No Known Allergies  Current Medications: Current Outpatient Prescriptions  Medication Sig Dispense Refill  . allopurinol (ZYLOPRIM) 100 MG tablet Take 2 tablets (200 mg total) by mouth daily. 60 tablet 1  . atorvastatin (LIPITOR) 20 MG tablet Take 20 mg by mouth daily.    . ferrous sulfate 325 (65 FE) MG EC tablet Take 1 tablet by mouth a day with orange juice or vitamin C 30 tablet 1  . folic acid (FOLVITE) 1 MG tablet take 1 tablet by mouth once daily 30 tablet 1  . ibuprofen (ADVIL,MOTRIN) 600 MG tablet Take 600 mg by mouth every 6 (six) hours as needed.     . IMBRUVICA 140 MG capsul Take 3 capsules by mouth daily. Reported on 04/18/2016    . lisinopril-hydrochlorothiazide (PRINZIDE,ZESTORETIC) 20-12.5 MG per tablet Take 1 tablet by mouth daily.    . metoprolol tartrate (LOPRESSOR) 25 MG tablet Take 25 mg by mouth 2 (two) times daily.  0  . omeprazole (PRILOSEC) 20 MG capsule Take 20 mg by mouth daily.  0  . ondansetron (ZOFRAN) 4 MG tablet Take 1 tablet (4 mg total) by mouth every 8 (eight) hours as needed for nausea or vomiting. 20 tablet 1  . polyethylene glycol (MIRALAX / GLYCOLAX) packet Take 17 g by mouth  daily as needed for mild constipation. 14 each 1  . potassium chloride (K-DUR) 10 MEQ tablet Take 1 tablet (10 mEq total) by mouth 2 (two) times daily. 30 tablet 0  . chlorambucil (LEUKERAN) 2 MG tablet Take 3 tablets (6 mg total) by mouth daily. Give on an empty stomach 1 hour before or 2 hours after meals. (Patient not taking: Reported on 05/23/2016) 21 tablet 0  . predniSONE (DELTASONE) 20 MG tablet Take 2 tablets (40 mg total) by mouth daily with breakfast. (Patient not taking: Reported on 05/23/2016) 30 tablet 0  . predniSONE (DELTASONE) 5 MG tablet Take 1 tablet (5 mg total) by mouth daily with breakfast. (Patient not taking: Reported on  05/23/2016) 20 tablet 0   No current facility-administered medications for this visit.    Facility-Administered Medications Ordered in Other Visits  Medication Dose Route Frequency Provider Last Rate Last Dose  . cyanocobalamin ((VITAMIN B-12)) injection 1,000 mcg  1,000 mcg Intramuscular Once Lequita Asal, MD        Review of Systems:  GENERAL: Feels "fine". No fevers or sweats. Weight down 2 pounds. PERFORMANCE STATUS (ECOG): 2 HEENT: No runny nose, sore throat, mouth sores or tenderness. Lungs:  No shortness of breath or cough.  No wheezing.  No hemoptysis. Cardiac: No chest pain, palpitations, orthopnea, or PND. GI:  Eating.  Abdominal discomfort near umbilicus.  Bowel movements normal.  No nausea, vomiting, diarrhea, constipation, melena or hematochezia. GU: Urine clear.  No urgency, frequency, or dysuria. Musculoskeletal: No back pain. No joint pain. No muscle tenderness. Extremities: No pain or swelling. Skin: Nodules in skin associated with disease, no longer noticed by patient No rashes. Neuro: Tingling in toes.  No headache, numbness or weakness, balance or coordination issues. Endocrine: No diabetes, thyroid issues, hot flashes or night sweats. Psych:No mood changes, depression or anxiety. Pain: No pain. Review of systems: All other systems reviewed and found to be negative.  Physical Exam: Blood pressure (!) 159/69, pulse (!) 53, temperature (!) 96.7 F (35.9 C), temperature source Tympanic, resp. rate 18, weight 159 lb 13.3 oz (72.5 kg). GENERAL: Elderly woman sitting comfortably in the exam room in clinic in no acute distress. MENTAL STATUS: Alert and oriented to person, place and time. HEAD: Brown hair. Normocephalic, atraumatic, face symmetric, no Cushingoid features. EYES: Brown eyes. Pupils equal round and reactive to light and accomodation. No conjunctivitis or scleral icterus. ENT: Oropharynx clear without lesion. Tongue normal.  Mucous membranes moist.  RESPIRATORY: Normal respiratory excursion. No rales, wheezes or rhonchi. CARDIOVASCULAR: Regular rate and rhythm without murmur, rub or gallop. ABDOMEN: Soft, non-tender with active bowel sounds and no appreciable hepatosplenomegaly. No guarding or rebound tenderness.  SKIN: Few nodules in right posterior subcutaneous tissue (< 2 cm).  EXTREMITIES: No edema, no skin discoloration or tenderness. No palpable cords. LYMPH NODES: No palpable cervical, supraclavicular, axillary or inguinal adenopathy  NEUROLOGICAL: Unremarkable. PSYCH: Appropriate   Appointment on 05/23/2016  Component Date Value Ref Range Status  . WBC 05/23/2016 5.3  3.6 - 11.0 K/uL Final  . RBC 05/23/2016 5.07  3.80 - 5.20 MIL/uL Final  . Hemoglobin 05/23/2016 12.0  12.0 - 16.0 g/dL Final  . HCT 05/23/2016 36.6  35.0 - 47.0 % Final  . MCV 05/23/2016 72.2* 80.0 - 100.0 fL Final  . MCH 05/23/2016 23.6* 26.0 - 34.0 pg Final  . MCHC 05/23/2016 32.7  32.0 - 36.0 g/dL Final  . RDW 05/23/2016 24.6* 11.5 - 14.5 % Final  . Platelets 05/23/2016  79* 150 - 440 K/uL Final  . Neutrophils Relative % 05/23/2016 35%  % Final  . Neutro Abs 05/23/2016 1.9  1.4 - 6.5 K/uL Final  . Lymphocytes Relative 05/23/2016 52%  % Final  . Lymphs Abs 05/23/2016 2.7  1.0 - 3.6 K/uL Final  . Monocytes Relative 05/23/2016 10%  % Final  . Monocytes Absolute 05/23/2016 0.5  0.2 - 0.9 K/uL Final  . Eosinophils Relative 05/23/2016 2%  % Final  . Eosinophils Absolute 05/23/2016 0.1  0 - 0.7 K/uL Final  . Basophils Relative 05/23/2016 1%  % Final  . Basophils Absolute 05/23/2016 0.1  0 - 0.1 K/uL Final  . Sodium 05/23/2016 134* 135 - 145 mmol/L Final  . Potassium 05/23/2016 3.3* 3.5 - 5.1 mmol/L Final  . Chloride 05/23/2016 100* 101 - 111 mmol/L Final  . CO2 05/23/2016 25  22 - 32 mmol/L Final  . Glucose, Bld 05/23/2016 90  65 - 99 mg/dL Final  . BUN 05/23/2016 29* 6 - 20 mg/dL Final  . Creatinine, Ser 05/23/2016 1.11*  0.44 - 1.00 mg/dL Final  . Calcium 05/23/2016 9.3  8.9 - 10.3 mg/dL Final  . Total Protein 05/23/2016 9.0* 6.5 - 8.1 g/dL Final  . Albumin 05/23/2016 3.1* 3.5 - 5.0 g/dL Final  . AST 05/23/2016 16  15 - 41 U/L Final  . ALT 05/23/2016 16  14 - 54 U/L Final  . Alkaline Phosphatase 05/23/2016 31* 38 - 126 U/L Final  . Total Bilirubin 05/23/2016 0.4  0.3 - 1.2 mg/dL Final  . GFR calc non Af Amer 05/23/2016 44* >60 mL/min Final  . GFR calc Af Amer 05/23/2016 51* >60 mL/min Final   Comment: (NOTE) The eGFR has been calculated using the CKD EPI equation. This calculation has not been validated in all clinical situations. eGFR's persistently <60 mL/min signify possible Chronic Kidney Disease.   . Anion gap 05/23/2016 9  5 - 15 Final    Assessment:  Kristy Solomon is a 82 y.o. 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 She has massive subcutaneous deposits, pulmonary nodules, involvement of left kidney, and bowel. Disease was unresponsive to Rituxan in 2014.   Chest, abdomen, and pelvic CT scan on 03/17/2016 revealed progression of diffuse lymphoma with subcutaneous deposits throughout the subcutaneous fat of the chest, abdomen,and pelvis. There were multiple nodular infiltrates throughout the lungs, likely due to lymphoma but could also represent infection or atypical infection. There was massive retroperitoneal lymphadenopathy with diffuse retroperitoneal, mesenteric, and pelvic lymphadenopathy. There was wall thickening and pelvicsmall bowel probably representing lymphomas involvement. There was probable direct invasion of the left kidney.  Biopsy of the dominant SQ nodal conglomeration around the right flank on 03/18/2016 revealed a persistent low-grade CD5 positive B-cell lymphoma.  Flow cytometry was positive for CD20, CD79a, CD5, CD138. Neoplastic cells were positive for kappa and cyclin D1.  CD 23 was negative.   Additional molecular markers are  pending.  Bone marrow aspirate and biopsy on 03/18/2016 revealed no evidence of lymphoma or plasma cell neoplasm (0.2% kappa monoclonal plasma cells by flow analysis).  Marrow was hypercellular for age (50-60%) with trilineage hematopoiesis and no increased blasts. There was mild to moderate widespread increase in reticulin fibers. There was no storage iron detected.  The following studies were abnormal:  SPEP revealed a 5.0 gm/dl monoclonal spike.  IgG was 696, IgA 187, and IgM > 5850.  Beta2-microglobulin was 9.6 (0.6-2.4).  Serum viscosity was 5.5 (1.6-1.9).  Uric acid was 11.1 on  03/19/2016.     Normal studies included:  hepatitis B surface antigen, hepatitis B core antibody total, hepatitis C antibody, HIV testing, and LDH (123).  G6PD assay was 9.2 (4.6-13.5).   She has iron deficiency (ferritin 27; iron saturation 11%), B12 deficiency (147), and folate deficiency (5.9).  She has chronic renal insufficiency (BUN was 30-35, creatinine was 1.38 - 1.80 with a GFR 29-39 ml/min).  She has received B12 weekly (began 03/28/2016; last 04/25/2016).  She is on folic acid.   She received 1 week of chlorambucil with a tapering dose of prednisone (began 03/30/2016).  Cortisol was 9.5 on 05/17/2016.  She is currently day 28 of ibrutinib (began 04/26/2016).  She has mild thrombocytopenia (79,000).  SPEP was 4.5 gm/dL and IgM > 5850 on 04/25/2016.  Chest, abdomen, and pelvic CT scan on 04/21/2016 revealed a partial response to therapy.  There were bilateral pulmonary nodules/masses, measuring up to 5.6 cm in the right lower lobe (improved).  There was trace left pleural effusion.  There was a 12.7 cm left renal/perirenal mass (decreased) with associated mild left hydronephrosis.  Retroperitoneal/left pelvic lymphadenopathy was mildly decreased.  Multifocal subcutaneous nodules, measuring up to 4.5 cm in the left lower anterior abdominal wall were stable to mildly improved.  Symptomatically, she she notes some  mid abdominal discomfort possibly due to oral iron.  Urinalysis revealed 3+ Hgb and too numerous to count RBCs/hpf possibly related to lymphomatous involvement of the left kidney.  There was no obvious infection.  Urine is grossly clear now.  Plan: 1.  Labs today:  CBC with diff, CMP,uric acid. 2.  Discontinue prednisone 5 mg QOD.  AM cortisol was normal 48 hours off of steroids. 3.  Trial off oral iron to determine if causing abdominal discomfort. 4.  Continue B12 today and monthly. 5.  Continue ibrutinib 420 mg a day. 6.  Consult Dr. Erlene Quan - hematuria 7.  RTC in 2 weeks for MD assess and labs (CBC with diff, CMP, uric acid, SPEP, IgM).   Lequita Asal, MD  05/23/2016, 4:12 PM

## 2016-05-23 NOTE — Addendum Note (Signed)
Addended by: Betti Cruz on: 05/23/2016 04:55 PM   Modules accepted: Orders

## 2016-05-25 ENCOUNTER — Other Ambulatory Visit: Payer: Self-pay | Admitting: *Deleted

## 2016-05-25 DIAGNOSIS — C8300 Small cell B-cell lymphoma, unspecified site: Secondary | ICD-10-CM

## 2016-05-26 ENCOUNTER — Ambulatory Visit: Payer: Medicare HMO

## 2016-06-07 ENCOUNTER — Inpatient Hospital Stay: Payer: Medicare HMO | Admitting: Hematology and Oncology

## 2016-06-07 ENCOUNTER — Inpatient Hospital Stay: Payer: Medicare HMO

## 2016-06-08 ENCOUNTER — Other Ambulatory Visit: Payer: Self-pay | Admitting: *Deleted

## 2016-06-09 ENCOUNTER — Inpatient Hospital Stay (HOSPITAL_BASED_OUTPATIENT_CLINIC_OR_DEPARTMENT_OTHER): Payer: Medicare HMO | Admitting: Hematology and Oncology

## 2016-06-09 ENCOUNTER — Other Ambulatory Visit: Payer: Self-pay | Admitting: *Deleted

## 2016-06-09 ENCOUNTER — Inpatient Hospital Stay: Payer: Medicare HMO | Attending: Hematology and Oncology

## 2016-06-09 VITALS — BP 178/83 | HR 60 | Temp 96.8°F | Wt 161.2 lb

## 2016-06-09 DIAGNOSIS — R7989 Other specified abnormal findings of blood chemistry: Secondary | ICD-10-CM

## 2016-06-09 DIAGNOSIS — Z79899 Other long term (current) drug therapy: Secondary | ICD-10-CM | POA: Insufficient documentation

## 2016-06-09 DIAGNOSIS — D630 Anemia in neoplastic disease: Secondary | ICD-10-CM | POA: Insufficient documentation

## 2016-06-09 DIAGNOSIS — R918 Other nonspecific abnormal finding of lung field: Secondary | ICD-10-CM | POA: Insufficient documentation

## 2016-06-09 DIAGNOSIS — N189 Chronic kidney disease, unspecified: Secondary | ICD-10-CM

## 2016-06-09 DIAGNOSIS — Z808 Family history of malignant neoplasm of other organs or systems: Secondary | ICD-10-CM | POA: Insufficient documentation

## 2016-06-09 DIAGNOSIS — R109 Unspecified abdominal pain: Secondary | ICD-10-CM

## 2016-06-09 DIAGNOSIS — C8300 Small cell B-cell lymphoma, unspecified site: Secondary | ICD-10-CM

## 2016-06-09 DIAGNOSIS — E538 Deficiency of other specified B group vitamins: Secondary | ICD-10-CM

## 2016-06-09 DIAGNOSIS — D509 Iron deficiency anemia, unspecified: Secondary | ICD-10-CM

## 2016-06-09 DIAGNOSIS — C880 Waldenstrom macroglobulinemia: Secondary | ICD-10-CM

## 2016-06-09 DIAGNOSIS — N1330 Unspecified hydronephrosis: Secondary | ICD-10-CM

## 2016-06-09 DIAGNOSIS — I129 Hypertensive chronic kidney disease with stage 1 through stage 4 chronic kidney disease, or unspecified chronic kidney disease: Secondary | ICD-10-CM | POA: Diagnosis not present

## 2016-06-09 DIAGNOSIS — N2889 Other specified disorders of kidney and ureter: Secondary | ICD-10-CM | POA: Insufficient documentation

## 2016-06-09 DIAGNOSIS — E785 Hyperlipidemia, unspecified: Secondary | ICD-10-CM | POA: Diagnosis not present

## 2016-06-09 DIAGNOSIS — D696 Thrombocytopenia, unspecified: Secondary | ICD-10-CM

## 2016-06-09 LAB — COMPREHENSIVE METABOLIC PANEL
ALT: 16 U/L (ref 14–54)
AST: 16 U/L (ref 15–41)
Albumin: 3.2 g/dL — ABNORMAL LOW (ref 3.5–5.0)
Alkaline Phosphatase: 35 U/L — ABNORMAL LOW (ref 38–126)
Anion gap: 6 (ref 5–15)
BUN: 28 mg/dL — ABNORMAL HIGH (ref 6–20)
CO2: 25 mmol/L (ref 22–32)
Calcium: 8.8 mg/dL — ABNORMAL LOW (ref 8.9–10.3)
Chloride: 104 mmol/L (ref 101–111)
Creatinine, Ser: 1.42 mg/dL — ABNORMAL HIGH (ref 0.44–1.00)
GFR calc Af Amer: 38 mL/min — ABNORMAL LOW (ref >60)
GFR calc non Af Amer: 33 mL/min — ABNORMAL LOW (ref >60)
Glucose, Bld: 97 mg/dL (ref 65–99)
Potassium: 4 mmol/L (ref 3.5–5.1)
Sodium: 135 mmol/L (ref 135–145)
Total Bilirubin: 0.6 mg/dL (ref 0.3–1.2)
Total Protein: 8.4 g/dL — ABNORMAL HIGH (ref 6.5–8.1)

## 2016-06-09 LAB — CBC WITH DIFFERENTIAL/PLATELET
Basophils Absolute: 0.1 10*3/uL (ref 0–0.1)
Basophils Relative: 1 %
Eosinophils Absolute: 0.1 10*3/uL (ref 0–0.7)
Eosinophils Relative: 3 %
HCT: 39.7 % (ref 35.0–47.0)
Hemoglobin: 13 g/dL (ref 12.0–16.0)
Lymphocytes Relative: 35 %
Lymphs Abs: 1.9 10*3/uL (ref 1.0–3.6)
MCH: 24.2 pg — ABNORMAL LOW (ref 26.0–34.0)
MCHC: 32.8 g/dL (ref 32.0–36.0)
MCV: 73.6 fL — ABNORMAL LOW (ref 80.0–100.0)
Monocytes Absolute: 0.6 10*3/uL (ref 0.2–0.9)
Monocytes Relative: 11 %
Neutro Abs: 2.6 10*3/uL (ref 1.4–6.5)
Neutrophils Relative %: 50 %
Platelets: 60 10*3/uL — ABNORMAL LOW (ref 150–440)
RBC: 5.39 MIL/uL — ABNORMAL HIGH (ref 3.80–5.20)
RDW: 24.9 % — ABNORMAL HIGH (ref 11.5–14.5)
WBC: 5.3 10*3/uL (ref 3.6–11.0)

## 2016-06-09 LAB — URIC ACID: Uric Acid, Serum: 5.4 mg/dL (ref 2.3–6.6)

## 2016-06-09 NOTE — Progress Notes (Signed)
[facility] day:  06/09/16  Chief Complaint: Kristy Solomon is a 82 y.o. female with lymphoplasmacytic lymphoma who is seen for assessment on day 45 of Ibrutinib.  HPI: The patient was last seen in the medical oncology clinic on 05/23/2016.  At that time, she noted some mid abdominal discomfort possibly due to oral iron.  Urinalysis revealed 3+ Hgb and too numerous to count RBCs/hpf possibly related to lymphomatous involvement of the left kidney.  There was no obvious infection.  Her prednisone was discontinued.  She had a trial off her oral iron to see if this improved her abdominal pain.  She continued her B12.  She continued ibrutinib. She was referred to urology.  She has an appointment soon.  Symptomatically, the patient notes no blood in her urine.  She notes pain "sometimes in my stomach".  She states that being off iron has helped.   Past Medical History:  Diagnosis Date  . 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 Hyperlipidemia   . Hypertension   . Malignant lymphoma, lymphoplasmacytic ([place]) 12/27/2013  . Renal insufficiency     Past Surgical History:  Procedure Laterality Date  . ABDOMINAL HYSTERECTOMY    . HEMORRHOID SURGERY      Family History  Problem Relation Age of Onset  . Cancer Brother     throat ca  . Cancer Brother     bone cancer    Social History:  reports that she has never smoked. She has never used smokeless tobacco. She reports that she does not drink alcohol or use drugs.  She lives in [place] with her grandson and his wife. Her emergency contact is Cecille Po, her grandaughter- [phone]).  [name]'s phone number [phone]).  She is accompanied by her daughter today.  Allergies: No Known Allergies  Current Medications: Current Outpatient Prescriptions  Medication Sig Dispense Refill  . allopurinol (ZYLOPRIM) 100 MG tablet Take 2 tablets (200 mg total) by mouth daily. 60 tablet 1  .  atorvastatin (LIPITOR) 20 MG tablet Take 20 mg by mouth daily.    . chlorambucil (LEUKERAN) 2 MG tablet Take 3 tablets (6 mg total) by mouth daily. Give on an empty stomach 1 hour before or 2 hours after meals. 21 tablet 0  . ferrous sulfate 325 (65 FE) MG EC tablet Take 1 tablet by mouth a day with orange juice or vitamin C 30 tablet 1  . folic acid (FOLVITE) 1 MG tablet take 1 tablet by mouth once daily 30 tablet 1  . hydrALAZINE (APRESOLINE) 25 MG tablet     . ibuprofen (ADVIL,MOTRIN) 600 MG tablet Take 600 mg by mouth every 6 (six) hours as needed.     . IMBRUVICA 140 MG capsul Take 3 capsules by mouth daily. Reported on 04/18/2016    . lisinopril-hydrochlorothiazide (PRINZIDE,ZESTORETIC) 20-12.5 MG per tablet Take 1 tablet by mouth daily.    . metoprolol tartrate (LOPRESSOR) 25 MG tablet Take 25 mg by mouth 2 (two) times daily.  0  . omeprazole (PRILOSEC) 20 MG capsule Take 20 mg by mouth daily.  0  . polyethylene glycol (MIRALAX / GLYCOLAX) packet Take 17 g by mouth daily as needed for mild constipation. 14 each 1  . potassium chloride (K-DUR) 10 MEQ tablet Take 1 tablet (10 mEq total) by mouth daily. 30 tablet 0   No current facility-administered medications for this visit.    Facility-Administered Medications Ordered in Other Visits  Medication Dose Route Frequency  Provider Last Rate Last Dose  . cyanocobalamin ((VITAMIN B-12)) injection 1,000 mcg  1,000 mcg Intramuscular Once Lequita Asal, MD        Review of Systems:  GENERAL: Feels "good". No fevers or sweats. Weight up 2 pounds. PERFORMANCE STATUS (ECOG): 2 HEENT: No runny nose, sore throat, mouth sores or tenderness. Lungs:  No shortness of breath or cough.  No wheezing.  No hemoptysis. Cardiac: No chest pain, palpitations, orthopnea, or PND. GI:  Rare abdominal pain.  Bowel movements normal.  No nausea, vomiting, diarrhea, constipation, melena or hematochezia. GU: Urine clear.  No further hematuria.  No urgency,  frequency, or dysuria. Musculoskeletal: No back pain. No joint pain. No muscle tenderness. Extremities: No pain or swelling. Skin: Nodules in skin resolved. No rashes. Neuro: Tingling in toes.  No headache, numbness or weakness, balance or coordination issues. Endocrine: No diabetes, thyroid issues, hot flashes or night sweats. Psych:No mood changes, depression or anxiety. Pain: No pain. Review of systems: All other systems reviewed and found to be negative.  Physical Exam: Blood pressure (!) 178/83, pulse 60, temperature (!) 96.8 F (36 C), temperature source Tympanic, weight 161 lb 2.5 oz (73.1 kg). GENERAL: Elderly woman sitting comfortably in the exam room in clinic in no acute distress. MENTAL STATUS: Alert and oriented to person, place and time. HEAD: Brown hair in curlers. Normocephalic, atraumatic, face symmetric, no Cushingoid features. EYES: Brown eyes. Pupils equal round and reactive to light and accomodation. No conjunctivitis or scleral icterus. ENT: Oropharynx clear without lesion. Tongue normal. Mucous membranes moist.  RESPIRATORY: Normal respiratory excursion. No rales, wheezes or rhonchi. CARDIOVASCULAR: Regular rate and rhythm without murmur, rub or gallop. ABDOMEN: Soft, non-tender with active bowel sounds and no appreciable hepatosplenomegaly. No guarding or rebound tenderness.  SKIN: Rare barely palpable nodule in right posterior subcutaneous tissues.  EXTREMITIES: No edema, no skin discoloration or tenderness. No palpable cords. LYMPH NODES: No palpable cervical, supraclavicular, axillary or inguinal adenopathy  NEUROLOGICAL: Unremarkable. PSYCH: Appropriate   Appointment on 06/09/2016  Component Date Value Ref Range Status  . WBC 06/09/2016 5.3  3.6 - 11.0 K/uL Final  . RBC 06/09/2016 5.39* 3.80 - 5.20 MIL/uL Final  . Hemoglobin 06/09/2016 13.0  12.0 - 16.0 g/dL Final  . HCT 06/09/2016 39.7  35.0 - 47.0 % Final  . MCV 06/09/2016  73.6* 80.0 - 100.0 fL Final  . MCH 06/09/2016 24.2* 26.0 - 34.0 pg Final  . MCHC 06/09/2016 32.8  32.0 - 36.0 g/dL Final  . RDW 06/09/2016 24.9* 11.5 - 14.5 % Final  . Platelets 06/09/2016 PENDING  150 - 400 K/uL Incomplete  . Neutrophils Relative % 06/09/2016 50%  % Final  . Neutro Abs 06/09/2016 2.6  1.4 - 6.5 K/uL Final  . Lymphocytes Relative 06/09/2016 35%  % Final  . Lymphs Abs 06/09/2016 1.9  1.0 - 3.6 K/uL Final  . Monocytes Relative 06/09/2016 11%  % Final  . Monocytes Absolute 06/09/2016 0.6  0.2 - 0.9 K/uL Final  . Eosinophils Relative 06/09/2016 3%  % Final  . Eosinophils Absolute 06/09/2016 0.1  0 - 0.7 K/uL Final  . Basophils Relative 06/09/2016 1%  % Final  . Basophils Absolute 06/09/2016 0.1  0 - 0.1 K/uL Final  . Sodium 06/09/2016 135  135 - 145 mmol/L Final  . Potassium 06/09/2016 4.0  3.5 - 5.1 mmol/L Final  . Chloride 06/09/2016 104  101 - 111 mmol/L Final  . CO2 06/09/2016 25  22 - 32 mmol/L Final  .  Glucose, Bld 06/09/2016 97  65 - 99 mg/dL Final  . BUN 06/09/2016 28* 6 - 20 mg/dL Final  . Creatinine, Ser 06/09/2016 1.42* 0.44 - 1.00 mg/dL Final  . Calcium 06/09/2016 8.8* 8.9 - 10.3 mg/dL Final  . Total Protein 06/09/2016 8.4* 6.5 - 8.1 g/dL Final  . Albumin 06/09/2016 3.2* 3.5 - 5.0 g/dL Final  . AST 06/09/2016 16  15 - 41 U/L Final  . ALT 06/09/2016 16  14 - 54 U/L Final  . Alkaline Phosphatase 06/09/2016 35* 38 - 126 U/L Final  . Total Bilirubin 06/09/2016 0.6  0.3 - 1.2 mg/dL Final  . GFR calc non Af Amer 06/09/2016 33* >60 mL/min Final  . GFR calc Af Amer 06/09/2016 38* >60 mL/min Final   Comment: (NOTE) The eGFR has been calculated using the CKD EPI equation. This calculation has not been validated in all clinical situations. eGFR's persistently <60 mL/min signify possible Chronic Kidney Disease.   . Anion gap 06/09/2016 6  5 - 15 Final  . Uric Acid, Serum 06/09/2016 5.4  2.3 - 6.6 mg/dL Final    Assessment:  SHUNDRA WIRSING is a 82 y.o. 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 She presented with massive subcutaneous deposits, pulmonary nodules, involvement of left kidney, and bowel. Disease was unresponsive to Rituxan in 2014.   Chest, abdomen, and pelvic CT scan on 03/17/2016 revealed progression of diffuse lymphoma with subcutaneous deposits throughout the subcutaneous fat of the chest, abdomen,and pelvis. There were multiple nodular infiltrates throughout the lungs, likely due to lymphoma but could also represent infection or atypical infection. There was massive retroperitoneal lymphadenopathy with diffuse retroperitoneal, mesenteric, and pelvic lymphadenopathy. There was wall thickening and pelvicsmall bowel probably representing lymphomas involvement. There was probable direct invasion of the left kidney.  Biopsy of the dominant SQ nodal conglomeration around the right flank on 03/18/2016 revealed a persistent low-grade CD5 positive B-cell lymphoma.  Flow cytometry was positive for CD20, CD79a, CD5, CD138. Neoplastic cells were positive for kappa and cyclin D1.  CD 23 was negative.   Additional molecular markers are pending.  Bone marrow aspirate and biopsy on 03/18/2016 revealed no evidence of lymphoma or plasma cell neoplasm (0.2% kappa monoclonal plasma cells by flow analysis).  Marrow was hypercellular for age (50-60%) with trilineage hematopoiesis and no increased blasts. There was mild to moderate widespread increase in reticulin fibers. There was no storage iron detected.  The following studies were abnormal:  Beta2-microglobulin was 9.6 (0.6-2.4).  Serum viscosity was 5.5 (1.6-1.9).  Uric acid was 11.1 on 03/19/2016 and 5.7 on 05/23/2016.     Normal studies included:  hepatitis B surface antigen, hepatitis B core antibody total, hepatitis C antibody, HIV testing, and LDH (123).  G6PD assay was 9.2 (4.6-13.5).   She has iron deficiency (ferritin 27; iron saturation 11%), B12  deficiency (147), and folate deficiency (5.9).  She has chronic renal insufficiency (BUN was 30-35, creatinine was 1.38 - 1.80 with a GFR 29-39 ml/min).  She has received B12 weekly (began 03/28/2016; last 05/23/2016).  She is on folic acid.   She received 1 week of chlorambucil with a tapering dose of prednisone (began 03/30/2016).  Cortisol was 9.5 on 05/17/2016.  She is currently day 45 of ibrutinib (began 04/26/2016).  She has mild thrombocytopenia (60,000).   SPEP is as follows:  5.0 gm/dL on 03/18/2016 and 4.5 on 04/25/2016. IgM was > 5850 on 03/18/2016, > 5850 on 04/25/2016, and 4652 on 06/09/2016.  Chest, abdomen, and  pelvic CT scan on 04/21/2016 revealed a partial response to therapy.  There were bilateral pulmonary nodules/masses, measuring up to 5.6 cm in the right lower lobe (improved).  There was trace left pleural effusion.  There was a 12.7 cm left renal/perirenal mass (decreased) with associated mild left hydronephrosis.  Retroperitoneal/left pelvic lymphadenopathy was mildly decreased.  Multifocal subcutaneous nodules, measuring up to 4.5 cm in the left lower anterior abdominal wall were stable to mildly improved.  Symptomatically, she has rare abdominal pain.  Discomfort has improved off oral iron.  She has a history of hematuria likely related to lymphomatous involvement of the left kidney.  She has a few tiny SQ nodules.  Plan: 1.  Labs today:  CBC with diff, CMP, uric acid, SPEP, IgM. 2.  Continue ibrutinib 420 mg a day. 3.  Continue B12 monthly (due 06/20/2016 and 07/18/2016). 4.  Labs on 06/20/2016 when she comes for B12 injection (CBC with diff, SPEP, BMP) 5.  RTC on 07/18/2016 for MD assessment, labs (CBC with diff, CMP, IgM, uric acid) + B12 injection   Lequita Asal, MD  06/09/2016, 4:21 PM

## 2016-06-10 LAB — IGM: IgM, Serum: 4652 mg/dL — ABNORMAL HIGH (ref 26–217)

## 2016-06-15 ENCOUNTER — Other Ambulatory Visit: Payer: Self-pay | Admitting: Hematology and Oncology

## 2016-06-18 ENCOUNTER — Other Ambulatory Visit: Payer: Self-pay | Admitting: Hematology and Oncology

## 2016-06-20 ENCOUNTER — Inpatient Hospital Stay: Payer: Medicare HMO

## 2016-06-20 ENCOUNTER — Other Ambulatory Visit: Payer: Self-pay

## 2016-06-20 DIAGNOSIS — C8300 Small cell B-cell lymphoma, unspecified site: Secondary | ICD-10-CM

## 2016-06-20 DIAGNOSIS — E538 Deficiency of other specified B group vitamins: Secondary | ICD-10-CM

## 2016-06-20 LAB — CBC WITH DIFFERENTIAL/PLATELET
Basophils Absolute: 0 10*3/uL (ref 0–0.1)
Basophils Relative: 1 %
Eosinophils Absolute: 0.1 10*3/uL (ref 0–0.7)
Eosinophils Relative: 2 %
HCT: 40.1 % (ref 35.0–47.0)
Hemoglobin: 13 g/dL (ref 12.0–16.0)
Lymphocytes Relative: 41 %
Lymphs Abs: 2.5 10*3/uL (ref 1.0–3.6)
MCH: 24.4 pg — ABNORMAL LOW (ref 26.0–34.0)
MCHC: 32.5 g/dL (ref 32.0–36.0)
MCV: 75 fL — ABNORMAL LOW (ref 80.0–100.0)
Monocytes Absolute: 0.6 10*3/uL (ref 0.2–0.9)
Monocytes Relative: 10 %
Neutro Abs: 2.8 10*3/uL (ref 1.4–6.5)
Neutrophils Relative %: 46 %
Platelets: 75 10*3/uL — ABNORMAL LOW (ref 150–440)
RBC: 5.35 MIL/uL — ABNORMAL HIGH (ref 3.80–5.20)
RDW: 24.1 % — ABNORMAL HIGH (ref 11.5–14.5)
WBC: 6.1 10*3/uL (ref 3.6–11.0)

## 2016-06-20 LAB — BASIC METABOLIC PANEL
Anion gap: 9 (ref 5–15)
BUN: 33 mg/dL — ABNORMAL HIGH (ref 6–20)
CO2: 24 mmol/L (ref 22–32)
Calcium: 9 mg/dL (ref 8.9–10.3)
Chloride: 103 mmol/L (ref 101–111)
Creatinine, Ser: 1.4 mg/dL — ABNORMAL HIGH (ref 0.44–1.00)
GFR calc Af Amer: 39 mL/min — ABNORMAL LOW (ref >60)
GFR calc non Af Amer: 33 mL/min — ABNORMAL LOW (ref >60)
Glucose, Bld: 122 mg/dL — ABNORMAL HIGH (ref 65–99)
Potassium: 3.9 mmol/L (ref 3.5–5.1)
Sodium: 136 mmol/L (ref 135–145)

## 2016-06-20 MED ORDER — CYANOCOBALAMIN 1000 MCG/ML IJ SOLN
1000.0000 ug | Freq: Once | INTRAMUSCULAR | Status: AC
  Administered 2016-06-20: 1000 ug via INTRAMUSCULAR
  Filled 2016-06-20: qty 1

## 2016-06-22 LAB — PROTEIN ELECTROPHORESIS, SERUM
A/G Ratio: 0.7 (ref 0.7–1.7)
Albumin ELP: 3.2 g/dL (ref 2.9–4.4)
Alpha-1-Globulin: 0.3 g/dL (ref 0.0–0.4)
Alpha-2-Globulin: 0.6 g/dL (ref 0.4–1.0)
Beta Globulin: 1 g/dL (ref 0.7–1.3)
Gamma Globulin: 2.9 g/dL — ABNORMAL HIGH (ref 0.4–1.8)
Globulin, Total: 4.8 g/dL — ABNORMAL HIGH (ref 2.2–3.9)
M-Spike, %: 2.3 g/dL — ABNORMAL HIGH
Total Protein ELP: 8 g/dL (ref 6.0–8.5)

## 2016-06-24 ENCOUNTER — Ambulatory Visit (INDEPENDENT_AMBULATORY_CARE_PROVIDER_SITE_OTHER): Payer: Medicare HMO | Admitting: Urology

## 2016-06-24 ENCOUNTER — Encounter: Payer: Self-pay | Admitting: Urology

## 2016-06-24 VITALS — BP 228/81 | HR 57 | Ht 65.0 in | Wt 166.0 lb

## 2016-06-24 DIAGNOSIS — N183 Chronic kidney disease, stage 3 unspecified: Secondary | ICD-10-CM

## 2016-06-24 DIAGNOSIS — N200 Calculus of kidney: Secondary | ICD-10-CM

## 2016-06-24 DIAGNOSIS — N2889 Other specified disorders of kidney and ureter: Secondary | ICD-10-CM | POA: Diagnosis not present

## 2016-06-24 DIAGNOSIS — R3129 Other microscopic hematuria: Secondary | ICD-10-CM

## 2016-06-24 LAB — URINALYSIS, COMPLETE
Bilirubin, UA: NEGATIVE
Glucose, UA: NEGATIVE
Ketones, UA: NEGATIVE
NITRITE UA: NEGATIVE
PH UA: 5 (ref 5.0–7.5)
Specific Gravity, UA: 1.02 (ref 1.005–1.030)
UUROB: 0.2 mg/dL (ref 0.2–1.0)

## 2016-06-24 LAB — MICROSCOPIC EXAMINATION

## 2016-06-24 NOTE — Progress Notes (Signed)
06/24/2016 9:50 AM   [name] 04/14/1929 [id number]  Referring provider: Jodi Marble, MD [address] [place], [place] [address]  Chief Complaint  Patient presents with  . New Patient (Initial Visit)    Kidney mass    HPI: 82 year old female referred by Dr. 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 She resents today for evaluation of microscopic hematuria.  Most recent imaging on 04/21/2016 shows a large approximately 12.7 x 9.2. Renal/periureteral invasive appearing lymphoma extending along the proximal left ureter with associated mild left hydronephrosis. The left kidney, there is a 1.6 cm nonobstructing lower pole stone. Her right kidney appears mildly atrophic with multiple large stones measuring up to 1.3 cm in the upper pole extending into the renal pelvis and proximal collecting system without hydronephrosis or evidence of obstruction.  Overall stone burden not remarkably changed since 2016.  She does have a personal history of kidney stones in the remote past, greater than 10 years ago. She states that she was able to pass the stones on her own and never required surgical intervention.   She has no urinary complaints today. She denies urgency, frequency, or incontinence. No gross hematuria. No issues with urinary tract infections.  She does have baseline chronic kidney disease, creatinine has been relatively stable around 1.1-1.4 but as high as 1.8 approximately one year ago. Most recent creatinine 5 days ago 1.40.   PMH: Past Medical History:  Diagnosis Date  . 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 Hyperlipidemia   . Hypertension   . Malignant lymphoma, lymphoplasmacytic ([place]) 12/27/2013  . Renal insufficiency     Surgical History: Past Surgical History:  Procedure Laterality Date  . ABDOMINAL HYSTERECTOMY    . HEMORRHOID SURGERY       Home Medications:    Medication List       Accurate as of 06/24/16  9:50 AM. Always use your most recent med list.          allopurinol 100 MG tablet Commonly known as:  ZYLOPRIM take 2 tablet by mouth once daily   atorvastatin 20 MG tablet Commonly known as:  LIPITOR Take 20 mg by mouth daily.   chlorambucil 2 MG tablet Commonly known as:  LEUKERAN Take 3 tablets (6 mg total) by mouth daily. Give on an empty stomach 1 hour before or 2 hours after meals.   ferrous sulfate 325 (65 FE) MG EC tablet Take 1 tablet by mouth a day with orange juice or vitamin C   folic acid 1 MG tablet Commonly known as:  FOLVITE take 1 tablet by mouth once daily   hydrALAZINE 25 MG tablet Commonly known as:  APRESOLINE   ibuprofen 600 MG tablet Commonly known as:  ADVIL,MOTRIN Take 600 mg by mouth every 6 (six) hours as needed.   IMBRUVICA 140 MG capsul Generic drug:  ibrutinib Take 3 capsules by mouth daily. Reported on 04/18/2016   lisinopril-hydrochlorothiazide 20-12.5 MG tablet Commonly known as:  PRINZIDE,ZESTORETIC Take 1 tablet by mouth daily.   metoprolol tartrate 25 MG tablet Commonly known as:  LOPRESSOR Take 25 mg by mouth 2 (two) times daily.   omeprazole 20 MG capsule Commonly known as:  PRILOSEC Take 20 mg by mouth daily.   polyethylene glycol packet Commonly known as:  MIRALAX / GLYCOLAX Take 17 g by mouth daily as needed for mild constipation.   potassium chloride 10 MEQ tablet Commonly  known as:  K-DUR take 1 tablet by mouth once daily       Allergies: No Known Allergies  Family History: Family History  Problem Relation Age of Onset  . Cancer Brother     throat ca  . Cancer Brother     bone cancer    Social History:  reports that she has never smoked. She has never used smokeless tobacco. She reports that she does not drink alcohol or use drugs.  ROS: UROLOGY Frequent Urination?: No Hard to postpone urination?: No Burning/pain with  urination?: No Get up at night to urinate?: No Leakage of urine?: No Urine stream starts and stops?: No Trouble starting stream?: No Do you have to strain to urinate?: No Blood in urine?: Yes Urinary tract infection?: No Sexually transmitted disease?: No Injury to kidneys or bladder?: No Painful intercourse?: No Weak stream?: No Currently pregnant?: No Vaginal bleeding?: No Last menstrual period?: n  Gastrointestinal Nausea?: No Vomiting?: No Indigestion/heartburn?: No Diarrhea?: No Constipation?: No  Constitutional Fever: No Night sweats?: No Weight loss?: No Fatigue?: No  Skin Skin rash/lesions?: No Itching?: No  Eyes Blurred vision?: No Double vision?: No  Ears/Nose/Throat Sore throat?: No Sinus problems?: No  Hematologic/Lymphatic Swollen glands?: No Easy bruising?: No  Cardiovascular Leg swelling?: No Chest pain?: No  Respiratory Cough?: No Shortness of breath?: No  Endocrine Excessive thirst?: No  Musculoskeletal Back pain?: No Joint pain?: No  Neurological Headaches?: No Dizziness?: No  Psychologic Depression?: No Anxiety?: No  Physical Exam: BP (!) 228/81   Pulse (!) 57   Ht 5\' 5"  (1.651 m)   Wt 166 lb (75.3 kg)   BMI 27.62 kg/m   Constitutional:  Alert and oriented, No acute distress.  Initially presented with daughter today, daughter left abruptly after a verbal altercation with her mother. HEENT: [place] AT, moist mucus membranes.  Trachea midline, no masses. Cardiovascular: No clubbing, cyanosis, or edema. Respiratory: Normal respiratory effort, no increased work of breathing. GI: Abdomen is soft, nontender, nondistended, no abdominal masses GU: No CVA tenderness.  Skin: No rashes, bruises or suspicious lesions.. Neurologic: Grossly intact, no focal deficits, moving all 4 extremities. Psychiatric: Normal mood and affect.  Laboratory Data: Lab Results  Component Value Date   WBC 6.1 06/20/2016   HGB 13.0 06/20/2016   HCT  40.1 06/20/2016   MCV 75.0 (L) 06/20/2016   PLT 75 (L) 06/20/2016    Lab Results  Component Value Date   CREATININE 1.40 (H) 06/20/2016    Urinalysis    Component Value Date/Time   COLORURINE YELLOW (A) 05/17/2016 0845   APPEARANCEUR CLOUDY (A) 05/17/2016 0845   APPEARANCEUR Hazy 04/12/2014 0111   LABSPEC 1.014 05/17/2016 0845   LABSPEC 1.019 04/12/2014 0111   PHURINE 6.0 05/17/2016 0845   GLUCOSEU NEGATIVE 05/17/2016 0845   GLUCOSEU Negative 04/12/2014 0111   HGBUR 3+ (A) 05/17/2016 0845   BILIRUBINUR NEGATIVE 05/17/2016 0845   BILIRUBINUR Negative 04/12/2014 0111   KETONESUR NEGATIVE 05/17/2016 0845   PROTEINUR 100 (A) 05/17/2016 0845   NITRITE NEGATIVE 05/17/2016 0845   LEUKOCYTESUR TRACE (A) 05/17/2016 0845   LEUKOCYTESUR 2+ 04/12/2014 0111    Pertinent Imaging: CLINICAL DATA:  Lymphoplasmacytic lymphoma, on oral chemotherapy  EXAM: CT CHEST, ABDOMEN AND PELVIS WITHOUT CONTRAST  TECHNIQUE: Multidetector CT imaging of the chest, abdomen and pelvis was performed following the standard protocol without IV contrast.  COMPARISON:  03/17/2016  FINDINGS: CT CHEST FINDINGS  Cardiovascular: Cardiomegaly.  No pericardial effusion.  Three vessel coronary atherosclerosis.  Atherosclerotic calcifications of the aortic arch.  Mediastinum/Nodes: No suspicious mediastinal lymphadenopathy.  Left axillary lymphadenopathy, measuring up to 1.6 cm short axis (series 2/ image 21), previously 1.7 cm. Additional small subcentimeter right axillary nodes, improved.  Lungs/Pleura: Multifocal bilateral pulmonary nodules/masses, improved. For example:  --4.5 x 3.5 cm right lower lobe mass (series 5/image 98), previously 5.6 x 5.4 cm  --5.6 x 3.9 cm left lower lobe mass (series 5/ image 111), previously 6.4 x 4.8 cm  --3.0 x 3.2 cm central left lower lobe mass (series 5/ image 83), previously 4.4 x 4.0 cm  Trace left pleural effusion.  No  pneumothorax.  Musculoskeletal: Multiple subcutaneous nodules, including a dominant 2.1 x 3.8 cm lesion in the left anterior chest (series 2/image 14), previously 2.4 x 3.9 cm.  Degenerative changes of the thoracic spine.  CT ABDOMEN PELVIS FINDINGS  Hepatobiliary: Unenhanced liver is grossly unremarkable.  Gallbladder is unremarkable. No intrahepatic or extrahepatic ductal dilatation.  Pancreas: Within normal limits.  Spleen: Normal in size.  Adrenals/Urinary Tract: Right adrenal gland is within normal limits.  Left renal gland is poorly visualized but grossly unremarkable.  9.2 x 12.7 cm left renal/ perirenal mass, previously 10.7 x 13.8 cm, corresponding to perirenal/para ureteral lymphoma. Abnormal soft tissue extends along the proximal left ureter (series 2/ image 39). Associated mild left hydronephrosis with nonobstructing calculi measuring up to 1.6 cm in the upper pole (series 2/image 32).  Multiple nonobstructing right renal calculi measuring up to 2.3 cm in the upper pole (series 2/ image 66). 2.0 cm calculus in the right proximal collecting system (series 2/image 70). No hydronephrosis.  Bladder is within normal limits.  Stomach/Bowel: Stomach is within normal limits.  No evidence of bowel obstruction.  Colonic diverticulosis, without evidence of diverticulitis.  Vascular/Lymphatic: Extensive atherosclerotic calcifications the abdominal aorta and branch vessels.  No evidence abdominal aortic aneurysm.  1.8 cm short axis left para-aortic node (series 2/ image 57), previously 2.2 cm.  3.7 cm short axis left external iliac/deep inguinal node (series 2/image 95), previously 4.1 cm  Reproductive: Status post hysterectomy.  Bilateral ovaries are unremarkable.  Other: No abdominopelvic ascites.  Musculoskeletal: Multifocal subcutaneous nodules, including a dominant 4.4 x 2.5 cm lesion along the left lower anterior abdominal wall  (series 2/image 88), previously 4.5 x 2.5 cm, grossly unchanged.  Mild degenerative changes of the thoracic spine.  IMPRESSION: Partial response to therapy.  Bilateral pulmonary nodules/masses, measuring up to 5.6 cm in the right lower lobe, improved. Trace left pleural effusion.  12.7 cm left renal/perirenal mass, decreased. Associated mild left hydronephrosis.  Retroperitoneal/left pelvic lymphadenopathy, mildly decreased.  Multifocal subcutaneous nodules, measuring up to 4.5 cm in the left lower anterior abdominal wall, stable versus mildly improved.  Additional ancillary findings as above.   Electronically Signed   By: Julian Hy M.D.   On: 04/21/2016 14:23  CT scan personally reviewed today. This is compared to CT scan from 12/2014.  Assessment & Plan:    1. Microscopic hematuria Likely secondary to infiltrating lymphoma involving left kidney and/or significant burden of nonobstructing nephrolithiasis.  Briefly discussed cystoscopy, however, given her all multiple medical comorbidities, she would not want to proceed to surgery if any tumor or pathology was identified. As such, we'll defer. - Urinalysis, Complete - CULTURE, URINE COMPREHENSIVE - DG Abd 1 View; Future  2. Nephrolithiasis Significant bilateral nonobstructing nephrolithiasis, right greater than left with near staghorn. This appears relatively stable over the past year.  Overall string burden is quite concerning  for complications such as eventual obstruction, infections, and renal compromise.  Since discussed in detail. Clearing her of her right-sided stone burden would likely involve percutaneous nephrolithotomy. She is not interested in surgical intervention. As such, we will continue to follow very closely. Plan for follow-up in 3 months with KUB.  3. Chronic kidney disease, stage 3 (moderate) Stable chronic CK D, we'll continue to follow closely  4. Left renal mass Infiltrative  lymphoma, being treated by oncology. Possible source of microscopic hematuria.  Return in about 3 months (around 09/23/2016) for with KUB .  Hollice Espy, MD  [facility] [address] [place], [place] [address] [phone]

## 2016-06-28 LAB — CULTURE, URINE COMPREHENSIVE

## 2016-07-02 ENCOUNTER — Encounter: Payer: Self-pay | Admitting: Hematology and Oncology

## 2016-07-10 ENCOUNTER — Other Ambulatory Visit: Payer: Self-pay | Admitting: Hematology and Oncology

## 2016-07-17 NOTE — Progress Notes (Addendum)
[facility] day:  07/18/16  Chief Complaint: Kristy Solomon is a 82 y.o. female with lymphoplasmacytic lymphoma who is seen for 5 week assessment on Ibrutinib.  HPI: The patient was last seen in the medical oncology clinic on 06/09/2016.  At that time, she had rare abdominal pain.  Discomfort had improved off oral iron.  She had a history of hematuria likely related to lymphomatous involvement of the left kidney.  She had a few tiny SQ nodules.  Labs on 06/20/2016 revealed a hematocrit of 40.1, hemoglobin 13.0, MCV 75, platelets 75,000, WBC 6100.  SPEP revealed an M-spike of 2.3 gm/dL.  Creatinine was 1.4 with a CrCl of 39 ml/min.  She saw Dr. Hollice Espy on 06/24/2016 revealed microscopic hematuria secondary to infiltrating lymphoma in the left kidney.  Decision was made defer cystoscopy given her multiple medical co-morbidities and not wanting to proceed with surgery if an abnormality was discovered.   She was also noted to have bilateral non-obstructing nephrolithiasis.  Right sided stone burden was significant.  She was not interested in percutaneous nephrolithotomy.  Plan was for follow-up in 3 months.  During the interim, she has felt "ok".  She denies any concerns.  She denies any headache or visual changes.   Past Medical History:  Diagnosis Date  . 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 Hyperlipidemia   . Hypertension   . Malignant lymphoma, lymphoplasmacytic ([place]) 12/27/2013  . Renal insufficiency     Past Surgical History:  Procedure Laterality Date  . ABDOMINAL HYSTERECTOMY    . HEMORRHOID SURGERY      Family History  Problem Relation Age of Onset  . Cancer Brother     throat ca  . Cancer Brother     bone cancer    Social History:  reports that she has never smoked. She has never used smokeless tobacco. She reports that she does not drink alcohol or use drugs.  She lives in [place] with her grandson and his  wife. Her emergency contact is Cecille Po, her grandaughter- [phone]).  [name]'s phone number [phone]).  She is accompanied by her son-in-law, Lake Bells, today.  Allergies: No Known Allergies  Current Medications: Current Outpatient Prescriptions  Medication Sig Dispense Refill  . allopurinol (ZYLOPRIM) 100 MG tablet take 2 tablet by mouth once daily 60 tablet 1  . atorvastatin (LIPITOR) 20 MG tablet Take 20 mg by mouth daily.    . chlorambucil (LEUKERAN) 2 MG tablet Take 3 tablets (6 mg total) by mouth daily. Give on an empty stomach 1 hour before or 2 hours after meals. 21 tablet 0  . ferrous sulfate 325 (65 FE) MG EC tablet Take 1 tablet by mouth a day with orange juice or vitamin C 30 tablet 1  . folic acid (FOLVITE) 1 MG tablet take 1 tablet by mouth once daily 30 tablet 1  . hydrALAZINE (APRESOLINE) 25 MG tablet     . ibuprofen (ADVIL,MOTRIN) 600 MG tablet Take 600 mg by mouth every 6 (six) hours as needed.     . IMBRUVICA 140 MG capsul Take 3 capsules by mouth daily. Reported on 04/18/2016    . lisinopril-hydrochlorothiazide (PRINZIDE,ZESTORETIC) 20-12.5 MG per tablet Take 1 tablet by mouth daily.    . metoprolol tartrate (LOPRESSOR) 25 MG tablet Take 25 mg by mouth 2 (two) times daily.  0  . omeprazole (PRILOSEC) 20 MG capsule Take 20 mg by mouth daily.  0  . polyethylene glycol (MIRALAX /  GLYCOLAX) packet Take 17 g by mouth daily as needed for mild constipation. 14 each 1  . potassium chloride (K-DUR) 10 MEQ tablet take 1 tablet by mouth once daily 30 tablet 0   No current facility-administered medications for this visit.    Facility-Administered Medications Ordered in Other Visits  Medication Dose Route Frequency Provider Last Rate Last Dose  . cyanocobalamin ((VITAMIN B-12)) injection 1,000 mcg  1,000 mcg Intramuscular Once Lequita Asal, MD        Review of Systems:  GENERAL: Feels "ok". No fevers or sweats. Weight up 12 pounds. PERFORMANCE STATUS (ECOG):  2 HEENT: No runny nose, sore throat, mouth sores or tenderness. Lungs:  No shortness of breath or cough.  No wheezing.  No hemoptysis. Cardiac: No chest pain, palpitations, orthopnea, or PND. GI:  Bowel movements normal.  No nausea, vomiting, diarrhea, constipation, melena or hematochezia. GU: No urgency, frequency, dysuria or hematuria. Musculoskeletal: No back pain. No joint pain. No muscle tenderness. Extremities: No pain or swelling. Skin: Nodules in skin resolved. No rashes. Neuro: No headache, numbness or weakness, balance or coordination issues. Endocrine: No diabetes, thyroid issues, hot flashes or night sweats. Psych:No mood changes, depression or anxiety. Pain: No pain. Review of systems: All other systems reviewed and found to be negative.  Physical Exam: Blood pressure (!) 211/68, pulse (!) 51, temperature (!) 96.9 F (36.1 C), temperature source Tympanic, resp. rate 18, weight 173 lb 4.5 oz (78.6 kg). GENERAL: Elderly woman sitting comfortably in the exam room in clinic in no acute distress. MENTAL STATUS: Alert and oriented to person, place and time. HEAD: Brown hair. Normocephalic, atraumatic, face symmetric, no Cushingoid features. EYES: Brown eyes. Pupils equal round and reactive to light and accomodation. No conjunctivitis or scleral icterus. ENT: Oropharynx clear without lesion. Tongue normal. Mucous membranes moist.  RESPIRATORY: Normal respiratory excursion. No rales, wheezes or rhonchi. CARDIOVASCULAR: Regular rate and rhythm without murmur, rub or gallop. ABDOMEN: Soft, non-tender with active bowel sounds and no appreciable hepatosplenomegaly. No guarding or rebound tenderness.  SKIN: No palpable nodules in subcutaneous tissues.  EXTREMITIES: No edema, no skin discoloration or tenderness. No palpable cords. LYMPH NODES: No palpable cervical, supraclavicular, axillary or inguinal adenopathy  NEUROLOGICAL: Unremarkable. PSYCH:  Appropriate   Appointment on 07/18/2016  Component Date Value Ref Range Status  . WBC 07/18/2016 5.1  3.6 - 11.0 K/uL Final  . RBC 07/18/2016 5.21* 3.80 - 5.20 MIL/uL Final  . Hemoglobin 07/18/2016 13.0  12.0 - 16.0 g/dL Final  . HCT 07/18/2016 40.7  35.0 - 47.0 % Final  . MCV 07/18/2016 78.1* 80.0 - 100.0 fL Final  . MCH 07/18/2016 25.0* 26.0 - 34.0 pg Final  . MCHC 07/18/2016 32.0  32.0 - 36.0 g/dL Final  . RDW 07/18/2016 20.9* 11.5 - 14.5 % Final  . Platelets 07/18/2016 PENDING  150 - 400 K/uL Incomplete  . Neutrophils Relative % 07/18/2016 PENDING  % Incomplete  . Neutro Abs 07/18/2016 PENDING  1.7 - 7.7 K/uL Incomplete  . Band Neutrophils 07/18/2016 PENDING  % Incomplete  . Lymphocytes Relative 07/18/2016 PENDING  % Incomplete  . Lymphs Abs 07/18/2016 PENDING  0.7 - 4.0 K/uL Incomplete  . Monocytes Relative 07/18/2016 PENDING  % Incomplete  . Monocytes Absolute 07/18/2016 PENDING  0.1 - 1.0 K/uL Incomplete  . Eosinophils Relative 07/18/2016 PENDING  % Incomplete  . Eosinophils Absolute 07/18/2016 PENDING  0.0 - 0.7 K/uL Incomplete  . Basophils Relative 07/18/2016 PENDING  % Incomplete  . Basophils Absolute 07/18/2016  PENDING  0.0 - 0.1 K/uL Incomplete  . WBC Morphology 07/18/2016 PENDING   Incomplete  . RBC Morphology 07/18/2016 PENDING   Incomplete  . Smear Review 07/18/2016 PENDING   Incomplete  . Other 07/18/2016 PENDING  % Incomplete  . nRBC 07/18/2016 PENDING  0 /100 WBC Incomplete  . Metamyelocytes Relative 07/18/2016 PENDING  % Incomplete  . Myelocytes 07/18/2016 PENDING  % Incomplete  . Promyelocytes Absolute 07/18/2016 PENDING  % Incomplete  . Blasts 07/18/2016 PENDING  % Incomplete  . Sodium 07/18/2016 137  135 - 145 mmol/L Final  . Potassium 07/18/2016 4.0  3.5 - 5.1 mmol/L Final  . Chloride 07/18/2016 105  101 - 111 mmol/L Final  . CO2 07/18/2016 26  22 - 32 mmol/L Final  . Glucose, Bld 07/18/2016 79  65 - 99 mg/dL Final  . BUN 07/18/2016 30* 6 - 20 mg/dL Final   . Creatinine, Ser 07/18/2016 1.35* 0.44 - 1.00 mg/dL Final  . Calcium 07/18/2016 9.0  8.9 - 10.3 mg/dL Final  . Total Protein 07/18/2016 8.3* 6.5 - 8.1 g/dL Final  . Albumin 07/18/2016 3.2* 3.5 - 5.0 g/dL Final  . AST 07/18/2016 17  15 - 41 U/L Final  . ALT 07/18/2016 14  14 - 54 U/L Final  . Alkaline Phosphatase 07/18/2016 41  38 - 126 U/L Final  . Total Bilirubin 07/18/2016 0.8  0.3 - 1.2 mg/dL Final  . GFR calc non Af Amer 07/18/2016 35* >60 mL/min Final  . GFR calc Af Amer 07/18/2016 40* >60 mL/min Final   Comment: (NOTE) The eGFR has been calculated using the CKD EPI equation. This calculation has not been validated in all clinical situations. eGFR's persistently <60 mL/min signify possible Chronic Kidney Disease.   . Anion gap 07/18/2016 6  5 - 15 Final    Assessment:  Kristy Solomon is a 82 y.o. 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 She presented with massive subcutaneous deposits, pulmonary nodules, involvement of left kidney, and bowel. Disease was unresponsive to Rituxan in 2014.   Chest, abdomen, and pelvic CT scan on 03/17/2016 revealed progression of diffuse lymphoma with subcutaneous deposits throughout the subcutaneous fat of the chest, abdomen,and pelvis. There were multiple nodular infiltrates throughout the lungs, likely due to lymphoma but could also represent infection or atypical infection. There was massive retroperitoneal lymphadenopathy with diffuse retroperitoneal, mesenteric, and pelvic lymphadenopathy. There was wall thickening and pelvicsmall bowel probably representing lymphomas involvement. There was probable direct invasion of the left kidney.  Biopsy of the dominant SQ nodal conglomeration around the right flank on 03/18/2016 revealed a persistent low-grade CD5 positive B-cell lymphoma.  Flow cytometry was positive for CD20, CD79a, CD5, CD138. Neoplastic cells were positive for kappa and cyclin D1.  CD 23  was negative.   Additional molecular markers are pending.  Bone marrow aspirate and biopsy on 03/18/2016 revealed no evidence of lymphoma or plasma cell neoplasm (0.2% kappa monoclonal plasma cells by flow analysis).  Marrow was hypercellular for age (50-60%) with trilineage hematopoiesis and no increased blasts. There was mild to moderate widespread increase in reticulin fibers. There was no storage iron detected.  The following studies were abnormal:  Beta2-microglobulin was 9.6 (0.6-2.4).  Serum viscosity was 5.5 (1.6-1.9).  Uric acid was 11.1 on 03/19/2016 and 5.7 on 05/23/2016.     Normal studies included:  hepatitis B surface antigen, hepatitis B core antibody total, hepatitis C antibody, HIV testing, and LDH (123).  G6PD assay was 9.2 (4.6-13.5).   She  has iron deficiency (ferritin 27; iron saturation 11%), B12 deficiency (147), and folate deficiency (5.9).  She has chronic renal insufficiency (BUN was 30-35, creatinine was 1.38 - 1.80 with a GFR 29-39 ml/min).  She has received B12 weekly (began 03/28/2016; last 06/20/2016).  She is on folic acid.   She received 1 week of chlorambucil with a tapering dose of prednisone (began 03/30/2016).  Cortisol was 9.5 on 05/17/2016.  She is currently day 45 of ibrutinib (began 04/26/2016).  She has mild thrombocytopenia (60,000).   SPEP is as follows:  5.0 gm/dL on 03/18/2016, 4.5 on 04/25/2016, and 2.3 on 06/20/2016. IgM was > 5850 on 03/18/2016, > 5850 on 04/25/2016, 4652 on 06/09/2016, and 3719 on 07/18/2016.  Chest, abdomen, and pelvic CT scan on 04/21/2016 revealed a partial response to therapy.  There were bilateral pulmonary nodules/masses, measuring up to 5.6 cm in the right lower lobe (improved).  There was trace left pleural effusion.  There was a 12.7 cm left renal/perirenal mass (decreased) with associated mild left hydronephrosis.  Retroperitoneal/left pelvic lymphadenopathy was mildly decreased.  Multifocal subcutaneous nodules, measuring up  to 4.5 cm in the left lower anterior abdominal wall were stable to mildly improved.  Symptomatically, she has poorly controlled hypertension.  Exam is unremarkable. Uric acid is 4.8.  Plan: 1.  Labs today:  CBC with diff, CMP, IgM, uric acid. 2.  Discuss concern for markedly elevated blood pressure.  Phone follow-up with PCP.  Discussed transport to ER for evaluation and management. 3.  Continue ibrutinib 420 mg a day. 4.  B12 today. 5.  RTC monthly for B12 x 2 with CBC with diff 6.  RTC in 3 months for MD assessment, labs (CBC with diff, CMP, SPEP, IgM, LDH, uric acid) + B12.   Lequita Asal, MD  07/18/2016, 3:51 PM

## 2016-07-18 ENCOUNTER — Ambulatory Visit: Payer: Medicare HMO

## 2016-07-18 ENCOUNTER — Emergency Department
Admission: EM | Admit: 2016-07-18 | Discharge: 2016-07-19 | Disposition: A | Discharge status: Home / Self Care | Payer: Medicare HMO | Attending: Emergency Medicine | Admitting: Emergency Medicine

## 2016-07-18 ENCOUNTER — Encounter: Payer: Self-pay | Admitting: Hematology and Oncology

## 2016-07-18 ENCOUNTER — Emergency Department: Payer: Medicare HMO

## 2016-07-18 ENCOUNTER — Inpatient Hospital Stay (HOSPITAL_BASED_OUTPATIENT_CLINIC_OR_DEPARTMENT_OTHER): Payer: Medicare HMO | Admitting: Hematology and Oncology

## 2016-07-18 ENCOUNTER — Inpatient Hospital Stay: Payer: Medicare HMO

## 2016-07-18 ENCOUNTER — Other Ambulatory Visit: Payer: Self-pay

## 2016-07-18 ENCOUNTER — Inpatient Hospital Stay: Payer: Medicare HMO | Attending: Hematology and Oncology

## 2016-07-18 ENCOUNTER — Encounter: Payer: Self-pay | Admitting: Emergency Medicine

## 2016-07-18 VITALS — BP 211/68 | HR 51 | Temp 96.9°F | Resp 18 | Wt 173.3 lb

## 2016-07-18 DIAGNOSIS — Z79899 Other long term (current) drug therapy: Secondary | ICD-10-CM | POA: Diagnosis not present

## 2016-07-18 DIAGNOSIS — C8300 Small cell B-cell lymphoma, unspecified site: Secondary | ICD-10-CM

## 2016-07-18 DIAGNOSIS — Z791 Long term (current) use of non-steroidal anti-inflammatories (NSAID): Secondary | ICD-10-CM | POA: Insufficient documentation

## 2016-07-18 DIAGNOSIS — D696 Thrombocytopenia, unspecified: Secondary | ICD-10-CM | POA: Diagnosis not present

## 2016-07-18 DIAGNOSIS — E538 Deficiency of other specified B group vitamins: Secondary | ICD-10-CM | POA: Diagnosis not present

## 2016-07-18 DIAGNOSIS — I10 Essential (primary) hypertension: Secondary | ICD-10-CM | POA: Diagnosis not present

## 2016-07-18 DIAGNOSIS — N132 Hydronephrosis with renal and ureteral calculous obstruction: Secondary | ICD-10-CM | POA: Insufficient documentation

## 2016-07-18 DIAGNOSIS — I129 Hypertensive chronic kidney disease with stage 1 through stage 4 chronic kidney disease, or unspecified chronic kidney disease: Secondary | ICD-10-CM | POA: Insufficient documentation

## 2016-07-18 DIAGNOSIS — N2889 Other specified disorders of kidney and ureter: Secondary | ICD-10-CM

## 2016-07-18 DIAGNOSIS — R918 Other nonspecific abnormal finding of lung field: Secondary | ICD-10-CM | POA: Diagnosis not present

## 2016-07-18 DIAGNOSIS — J90 Pleural effusion, not elsewhere classified: Secondary | ICD-10-CM | POA: Diagnosis not present

## 2016-07-18 DIAGNOSIS — R202 Paresthesia of skin: Secondary | ICD-10-CM | POA: Diagnosis not present

## 2016-07-18 DIAGNOSIS — D500 Iron deficiency anemia secondary to blood loss (chronic): Secondary | ICD-10-CM

## 2016-07-18 DIAGNOSIS — Z8572 Personal history of non-Hodgkin lymphomas: Secondary | ICD-10-CM | POA: Insufficient documentation

## 2016-07-18 DIAGNOSIS — D509 Iron deficiency anemia, unspecified: Secondary | ICD-10-CM | POA: Diagnosis not present

## 2016-07-18 LAB — BASIC METABOLIC PANEL
Anion gap: 8 (ref 5–15)
BUN: 29 mg/dL — ABNORMAL HIGH (ref 6–20)
CALCIUM: 8.9 mg/dL (ref 8.9–10.3)
CHLORIDE: 105 mmol/L (ref 101–111)
CO2: 24 mmol/L (ref 22–32)
CREATININE: 1.41 mg/dL — AB (ref 0.44–1.00)
GFR, EST AFRICAN AMERICAN: 38 mL/min — AB (ref >60)
GFR, EST NON AFRICAN AMERICAN: 33 mL/min — AB (ref >60)
Glucose, Bld: 103 mg/dL — ABNORMAL HIGH (ref 65–99)
Potassium: 3.7 mmol/L (ref 3.5–5.1)
SODIUM: 137 mmol/L (ref 135–145)

## 2016-07-18 LAB — COMPREHENSIVE METABOLIC PANEL
ALT: 14 U/L (ref 14–54)
AST: 17 U/L (ref 15–41)
Albumin: 3.2 g/dL — ABNORMAL LOW (ref 3.5–5.0)
Alkaline Phosphatase: 41 U/L (ref 38–126)
Anion gap: 6 (ref 5–15)
BUN: 30 mg/dL — ABNORMAL HIGH (ref 6–20)
CO2: 26 mmol/L (ref 22–32)
Calcium: 9 mg/dL (ref 8.9–10.3)
Chloride: 105 mmol/L (ref 101–111)
Creatinine, Ser: 1.35 mg/dL — ABNORMAL HIGH (ref 0.44–1.00)
GFR calc Af Amer: 40 mL/min — ABNORMAL LOW (ref >60)
GFR calc non Af Amer: 35 mL/min — ABNORMAL LOW (ref >60)
Glucose, Bld: 79 mg/dL (ref 65–99)
Potassium: 4 mmol/L (ref 3.5–5.1)
Sodium: 137 mmol/L (ref 135–145)
Total Bilirubin: 0.8 mg/dL (ref 0.3–1.2)
Total Protein: 8.3 g/dL — ABNORMAL HIGH (ref 6.5–8.1)

## 2016-07-18 LAB — CBC WITH DIFFERENTIAL/PLATELET
Basophils Absolute: 0.1 K/uL (ref 0–0.1)
Basophils Relative: 1 %
Eosinophils Absolute: 0.2 K/uL (ref 0–0.7)
Eosinophils Relative: 3 %
HCT: 40.7 % (ref 35.0–47.0)
Hemoglobin: 13 g/dL (ref 12.0–16.0)
Lymphocytes Relative: 41 %
Lymphs Abs: 2.1 K/uL (ref 1.0–3.6)
MCH: 25 pg — ABNORMAL LOW (ref 26.0–34.0)
MCHC: 32 g/dL (ref 32.0–36.0)
MCV: 78.1 fL — ABNORMAL LOW (ref 80.0–100.0)
Monocytes Absolute: 0.6 K/uL (ref 0.2–0.9)
Monocytes Relative: 12 %
Neutro Abs: 2.1 K/uL (ref 1.4–6.5)
Neutrophils Relative %: 43 %
Platelets: 61 K/uL — ABNORMAL LOW (ref 150–440)
RBC: 5.21 MIL/uL — ABNORMAL HIGH (ref 3.80–5.20)
RDW: 20.9 % — ABNORMAL HIGH (ref 11.5–14.5)
WBC: 5.1 K/uL (ref 3.6–11.0)

## 2016-07-18 LAB — CBC
HCT: 40.2 % (ref 35.0–47.0)
Hemoglobin: 12.8 g/dL (ref 12.0–16.0)
MCH: 25.2 pg — ABNORMAL LOW (ref 26.0–34.0)
MCHC: 32 g/dL (ref 32.0–36.0)
MCV: 78.8 fL — AB (ref 80.0–100.0)
PLATELETS: 62 10*3/uL — AB (ref 150–440)
RBC: 5.1 MIL/uL (ref 3.80–5.20)
RDW: 21.3 % — AB (ref 11.5–14.5)
WBC: 5 10*3/uL (ref 3.6–11.0)

## 2016-07-18 LAB — URIC ACID: Uric Acid, Serum: 4.8 mg/dL (ref 2.3–6.6)

## 2016-07-18 LAB — TROPONIN I

## 2016-07-18 MED ORDER — CYANOCOBALAMIN 1000 MCG/ML IJ SOLN
1000.0000 ug | Freq: Once | INTRAMUSCULAR | Status: AC
  Administered 2016-07-18: 1000 ug via INTRAMUSCULAR
  Filled 2016-07-18: qty 1

## 2016-07-18 NOTE — ED Triage Notes (Signed)
Pt ambulatory to triage room with steady gait, pt reports was sent by PCP due to elevated blood pressure. Pt does reports "it was 200 and something over 80." Pt reports right arm tingling x 4-5 days. Pt denies chest pain, dizziness, headache, blurry vision, sob, or abd pain. Pt alert and oriented, speaking in complete sentences, no increased work in breathing noted. Bilateral equal grips.

## 2016-07-18 NOTE — Progress Notes (Signed)
Rechecked pt's b/p and she states that she had it done last week in pharmacy [facility] and it was 200 on top and she does not remember the bottom.  I rechecked it manually and it was 210/88. I gave the numbers previous which were elevated to [facility] and she wants me to call PCP. Dr Elijio Miles is the PCP and called [phone] and was told by the triage nurse at this late in the day they are not seeing pt's but she could come tom at 11:45. I took this info to [place] and she states that the pt will need to go to ER with that high of b/p.  I went and spoke to pt and she states she will go to ER. I asked if I could get her a wheelchair and take her and she said no.  Her son inlaw will take her to church for 1 hour and then she will go to ER. I spoke to SIL and he states he is not going to take her there.  He works 3rd shift and he ahs to get sleep so pt can go to church or ER.  I went back to infusion and spoke to pt again and she states that there are alots of members of church that will be glad to take her to ER and she promises she will go there after church.  I did again warn her of the b/p being so elevated and that she could have stroke or Heart attack and that  Is why it is important to go to ER to manage her b/p until she can see PCP and she states she will go to ER today.

## 2016-07-18 NOTE — Progress Notes (Signed)
Patient's BP elevated today.  221/69 HR 51.  Recheck 211/68 HR 51.

## 2016-07-19 LAB — IGM: IgM, Serum: 3719 mg/dL — ABNORMAL HIGH (ref 26–217)

## 2016-07-19 NOTE — ED Notes (Signed)
Pt discharged to home.  Family member driving.  Discharge instructions reviewed.  Verbalized understanding.  No questions or concerns at this time.  Teach back verified.  Pt in NAD.  No items left in ED.   

## 2016-07-19 NOTE — ED Provider Notes (Signed)
[facility] Emergency Department Provider Note   ____________________________________________   First MD Initiated Contact with Patient 07/18/16 2337     (approximate)  I have reviewed the triage vital signs and the nursing notes.   HISTORY  Chief Complaint Hypertension    HPI Kristy Solomon is a 82 y.o. female who comes into the hospital today with elevated blood pressure. The patient reports that her blood pressure was 200 around 4 5 PM. The patient was at the cancer center for appointment and it was checked there when they noticed it being high. She reports that she had taken her blood pressure medication before she went to the appointment. She said that her right arms felt like pins and needles and it had felt that way for the past week. The patient denies any slurred speech or weakness in her arm. She denies headache, blurred vision or chest pain. She reports that the arm tingling does come and go in she's had it on and off a few times while she was here. The patient denies any nausea or vomiting. The cancer doctor sent the patient to the emergency department for evaluation of this elevated blood pressure.The patient did go to church prior to coming into the ER.   Past Medical History:  Diagnosis Date  . 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 Hyperlipidemia   . Hypertension   . 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 Renal insufficiency     Patient Active Problem List   Diagnosis Date Noted  . B12 deficiency 03/18/2016  . Folate deficiency 03/18/2016  . Iron deficiency anemia 03/18/2016  . Pneumonia 03/17/2016  . Pressure ulcer 03/17/2016  . Thrombocytopenia ([place]) 07/04/2014  . Acute renal failure ([place]) 07/04/2014  . Hyperuricemia 07/04/2014  . 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 Calculi, ureter 07/03/2012  . [place] hematuria 07/03/2012  . Hydronephrosis  07/03/2012  . 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 Calculus of kidney 07/02/2012  . Nonspecific finding on examination of urine 07/02/2012    Past Surgical History:  Procedure Laterality Date  . ABDOMINAL HYSTERECTOMY    . HEMORRHOID SURGERY      Prior to Admission medications   Medication Sig Start Date End Date Taking? Authorizing Provider  allopurinol (ZYLOPRIM) 100 MG tablet take 2 tablet by mouth once daily 06/18/16   Lequita Asal, MD  atorvastatin (LIPITOR) 20 MG tablet Take 20 mg by mouth daily. 12/24/13   Historical Provider, MD  chlorambucil (LEUKERAN) 2 MG tablet Take 3 tablets (6 mg total) by mouth daily. Give on an empty stomach 1 hour before or 2 hours after meals. 03/28/16   Lequita Asal, MD  ferrous sulfate 325 (65 FE) MG EC tablet Take 1 tablet by mouth a day with orange juice or vitamin C 03/23/16   Lequita Asal, MD  folic acid (FOLVITE) 1 MG tablet take 1 tablet by mouth once daily 07/10/16   Lequita Asal, MD  hydrALAZINE (APRESOLINE) 25 MG tablet  06/02/16   Historical Provider, MD  ibuprofen (ADVIL,MOTRIN) 600 MG tablet Take 600 mg by mouth every 6 (six) hours as needed.     Historical Provider, MD  IMBRUVICA 140 MG capsul Take 3 capsules by mouth daily. Reported on 04/18/2016 04/08/16   Historical Provider, MD  lisinopril-hydrochlorothiazide (PRINZIDE,ZESTORETIC) 20-12.5 MG per tablet Take 1 tablet by mouth daily. 12/24/13   Historical Provider,  MD  metoprolol tartrate (LOPRESSOR) 25 MG tablet Take 25 mg by mouth 2 (two) times daily. 03/05/16   Historical Provider, MD  omeprazole (PRILOSEC) 20 MG capsule Take 20 mg by mouth daily. 03/02/16   Historical Provider, MD  polyethylene glycol (MIRALAX / GLYCOLAX) packet Take 17 g by mouth daily as needed for mild constipation. 04/04/16   Lequita Asal, MD  potassium chloride (K-DUR) 10 MEQ tablet take 1 tablet by mouth once daily 06/15/16   Lequita Asal, MD      Allergies Review of patient's allergies indicates no known allergies.  Family History  Problem Relation Age of Onset  . Cancer Brother     throat ca  . Cancer Brother     bone cancer    Social History Social History  Substance Use Topics  . Smoking status: Never Smoker  . Smokeless tobacco: Never Used  . Alcohol use No    Review of Systems Constitutional: No fever/chills Eyes: No visual changes. ENT: No sore throat. Cardiovascular: Denies chest pain. Respiratory: Denies shortness of breath. Gastrointestinal: No abdominal pain.  No nausea, no vomiting.  No diarrhea.  No constipation. Genitourinary: Negative for dysuria. Musculoskeletal: Negative for back pain. Skin: Negative for rash. Neurological: Pins and needles to right arm  10-point ROS otherwise negative.  ____________________________________________   PHYSICAL EXAM:  VITAL SIGNS: ED Triage Vitals  Enc Vitals Group     BP 07/18/16 1925 124/69     Pulse Rate 07/18/16 1925 (!) 52     Resp 07/18/16 1925 16     Temp 07/18/16 1925 98.1 F (36.7 C)     Temp Source 07/18/16 1925 Oral     SpO2 07/18/16 1925 99 %     Weight 07/18/16 1925 173 lb (78.5 kg)     Height 07/18/16 1925 5\' 4"  (1.626 m)     Head Circumference --      Peak Flow --      Pain Score 07/18/16 2234 0     Pain Loc --      Pain Edu? --      Excl. in [place]? --     Constitutional: Alert and oriented. Well appearing and in no acute distress. Eyes: Conjunctivae are normal. PERRL. EOMI. Head: Atraumatic. Nose: No congestion/rhinnorhea. Mouth/Throat: Mucous membranes are moist.  Oropharynx non-erythematous. Cardiovascular: Normal rate, regular rhythm. Grossly normal heart sounds.  Good peripheral circulation. Respiratory: Normal respiratory effort.  No retractions. Lungs CTAB. Gastrointestinal: Soft and nontender. No distention. Positive bowel sounds Musculoskeletal: No lower extremity tenderness nor edema.   Neurologic:  Normal speech and  language. Cranial nerves II through XII are grossly intact with no focal motor or neuro deficits Skin:  Skin is warm, dry and intact.  Psychiatric: Mood and affect are normal.   ____________________________________________   LABS (all labs ordered are listed, but only abnormal results are displayed)  Labs Reviewed  BASIC METABOLIC PANEL - Abnormal; Notable for the following:       Result Value   Glucose, Bld 103 (*)    BUN 29 (*)    Creatinine, Ser 1.41 (*)    GFR calc non Af Amer 33 (*)    GFR calc Af Amer 38 (*)    All other components within normal limits  CBC - Abnormal; Notable for the following:    MCV 78.8 (*)    MCH 25.2 (*)    RDW 21.3 (*)    Platelets 62 (*)    All other components  within normal limits  TROPONIN I   ____________________________________________  EKG  ED ECG REPORT I, Loney Hering, the attending physician, personally viewed and interpreted this ECG.   Date: 07/18/2016  EKG Time: 1935  Rate: 53  Rhythm: sinus bradycardia  Axis: normal  Intervals:none  ST&T Change: Diffuse T-wave flattening with some flipped T waves in V6, which is consistent with her EKG from June 2017  ____________________________________________  RADIOLOGY  CT head ____________________________________________   PROCEDURES  Procedure(s) performed: None  Procedures  Critical Care performed: No  ____________________________________________   INITIAL IMPRESSION / ASSESSMENT AND PLAN / ED COURSE  Pertinent labs & imaging results that were available during my care of the patient were reviewed by me and considered in my medical decision making (see chart for details).  This is an 82 year old female who was sent from her cancer Center appointment with elevated blood pressure. When the patient did arrive to the emergency department her blood pressure did appear improved. She's had some right arm tingling she reports intermittently for multiple days but denies  any other symptoms at this time. The patient's blood work is unremarkable. She does have some chronic kidney disease but she has no other acute process seen on her blood work. The patient's troponin is negative but she denies any chest pain at this time. The patient does have low platelets but that has been consistent with her platelets in the past.  Clinical Course  Value Comment By Time  CT Head Wo Contrast No acute intracranial process; negative CT HEAD for age.  Biparietal scalp thickening versus scarring, recommend direct inspection.   Loney Hering, MD 10/16 2327   The patient did not receive any further medications prior to coming into the hospital but her blood pressures have been improved here in the emergency department. She denies any symptoms at this time. I feel that the patient will be appropriate for discharge to home as her CT scan is unremarkable and she has no other complaints. Looking at the patient's notes cancer Center she can be seen by her physician Dr. Elijio Miles in the office tomorrow. I will discharge the patient home and have her follow-up with her primary care physician. She has no other complaints at this time.  ____________________________________________   FINAL CLINICAL IMPRESSION(S) / ED DIAGNOSES  Final diagnoses:  Essential hypertension  Paresthesia      NEW MEDICATIONS STARTED DURING THIS VISIT:  Discharge Medication List as of 07/19/2016 12:29 AM       Note:  This document was prepared using Dragon voice recognition software and may include unintentional dictation errors.    Loney Hering, MD 07/19/16 [place] [place]

## 2016-08-15 ENCOUNTER — Inpatient Hospital Stay: Payer: Medicare HMO | Admitting: *Deleted

## 2016-08-15 ENCOUNTER — Inpatient Hospital Stay: Payer: Medicare HMO | Attending: Hematology and Oncology

## 2016-08-15 DIAGNOSIS — E538 Deficiency of other specified B group vitamins: Secondary | ICD-10-CM

## 2016-08-15 DIAGNOSIS — D630 Anemia in neoplastic disease: Secondary | ICD-10-CM

## 2016-08-15 DIAGNOSIS — Z79899 Other long term (current) drug therapy: Secondary | ICD-10-CM | POA: Diagnosis not present

## 2016-08-15 DIAGNOSIS — C8300 Small cell B-cell lymphoma, unspecified site: Secondary | ICD-10-CM

## 2016-08-15 DIAGNOSIS — D500 Iron deficiency anemia secondary to blood loss (chronic): Secondary | ICD-10-CM

## 2016-08-15 LAB — CBC WITH DIFFERENTIAL/PLATELET
Basophils Absolute: 0.1 10*3/uL (ref 0–0.1)
Basophils Relative: 1 %
Eosinophils Absolute: 0.2 10*3/uL (ref 0–0.7)
Eosinophils Relative: 4 %
HCT: 38.5 % (ref 35.0–47.0)
Hemoglobin: 12.3 g/dL (ref 12.0–16.0)
Lymphocytes Relative: 35 %
Lymphs Abs: 1.7 10*3/uL (ref 1.0–3.6)
MCH: 25.3 pg — ABNORMAL LOW (ref 26.0–34.0)
MCHC: 32 g/dL (ref 32.0–36.0)
MCV: 79.1 fL — ABNORMAL LOW (ref 80.0–100.0)
Monocytes Absolute: 0.7 10*3/uL (ref 0.2–0.9)
Monocytes Relative: 14 %
Neutro Abs: 2.3 10*3/uL (ref 1.4–6.5)
Neutrophils Relative %: 46 %
Platelets: 75 10*3/uL — ABNORMAL LOW (ref 150–440)
RBC: 4.86 MIL/uL (ref 3.80–5.20)
RDW: 18 % — ABNORMAL HIGH (ref 11.5–14.5)
WBC: 4.9 10*3/uL (ref 3.6–11.0)

## 2016-08-15 LAB — FERRITIN: Ferritin: 31 ng/mL (ref 11–307)

## 2016-08-15 MED ORDER — CYANOCOBALAMIN 1000 MCG/ML IJ SOLN
1000.0000 ug | Freq: Once | INTRAMUSCULAR | Status: AC
  Administered 2016-08-15: 1000 ug via INTRAMUSCULAR
  Filled 2016-08-15: qty 1

## 2016-08-20 ENCOUNTER — Other Ambulatory Visit: Payer: Self-pay | Admitting: Hematology and Oncology

## 2016-08-30 ENCOUNTER — Other Ambulatory Visit: Payer: Self-pay | Admitting: Hematology and Oncology

## 2016-09-12 ENCOUNTER — Inpatient Hospital Stay: Payer: Medicare HMO

## 2016-09-14 ENCOUNTER — Other Ambulatory Visit: Payer: Self-pay | Admitting: Hematology and Oncology

## 2016-09-15 ENCOUNTER — Ambulatory Visit: Payer: Medicare HMO | Admitting: Urology

## 2016-09-16 ENCOUNTER — Ambulatory Visit (INDEPENDENT_AMBULATORY_CARE_PROVIDER_SITE_OTHER): Payer: Medicare HMO | Admitting: Urology

## 2016-09-16 ENCOUNTER — Encounter: Payer: Self-pay | Admitting: Urology

## 2016-09-16 ENCOUNTER — Ambulatory Visit
Admission: RE | Admit: 2016-09-16 | Discharge: 2016-09-16 | Disposition: A | Discharge status: Home / Self Care | Payer: Medicare HMO | Source: Ambulatory Visit | Attending: Urology | Admitting: Urology

## 2016-09-16 ENCOUNTER — Ambulatory Visit: Payer: Medicare HMO

## 2016-09-16 VITALS — BP 158/67 | HR 56 | Ht 64.0 in | Wt 180.1 lb

## 2016-09-16 DIAGNOSIS — N183 Chronic kidney disease, stage 3 unspecified: Secondary | ICD-10-CM

## 2016-09-16 DIAGNOSIS — N200 Calculus of kidney: Secondary | ICD-10-CM | POA: Insufficient documentation

## 2016-09-16 DIAGNOSIS — N2889 Other specified disorders of kidney and ureter: Secondary | ICD-10-CM | POA: Diagnosis not present

## 2016-09-16 DIAGNOSIS — R3129 Other microscopic hematuria: Secondary | ICD-10-CM | POA: Diagnosis present

## 2016-09-16 NOTE — Progress Notes (Signed)
09/16/2016 1:25 PM   Kristy Solomon 07-15-1929 [id number]   Referring provider: Jodi Marble, MD [address] [place], [place] [address]  Chief Complaint  Patient presents with  . 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  Most recent imaging crossectional on 04/21/2016 shows a large approximately 12.7 x 9.2. Renal/periureteral invasive appearing lymphoma extending along the proximal left ureter with associated mild left hydronephrosis. The left kidney, there is a 1.6 cm nonobstructing lower pole stone. Her right kidney appears mildly atrophic with multiple large stones measuring up to 1.3 cm in the upper pole extending into the renal pelvis and proximal collecting system without hydronephrosis or evidence of obstruction.  Overall stone burden not remarkably changed since 2016.  Follow up KUB today essentially stable, large stone burden R>L.  She does have a personal history of kidney stones in the remote past, greater than 10 years ago. She states that she was able to pass the stones on her own and never required surgical intervention.  She has no urinary complaints today. She denies urgency, frequency, or incontinence. No gross hematuria. No issues with urinary tract infections.  She is a great mood today, states that she feels better every day!  She does have baseline chronic kidney disease, creatinine has been relatively stable around 1.1-1.4 but as high as 1.8 approximately one year ago.  Most recent creatinine 1.41 on 07/18/2016.   PMH: Past Medical History:  Diagnosis Date  . 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 Hyperlipidemia   . Hypertension   . 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 Renal insufficiency     Surgical History: Past Surgical History:  Procedure  Laterality Date  . ABDOMINAL HYSTERECTOMY    . HEMORRHOID SURGERY      Home Medications:  Allergies as of 09/16/2016   No Known Allergies     Medication List       Accurate as of 09/16/16  1:25 PM. Always use your most recent med list.          allopurinol 100 MG tablet Commonly known as:  ZYLOPRIM take 2 tablets by mouth once daily   atorvastatin 20 MG tablet Commonly known as:  LIPITOR Take 20 mg by mouth daily.   chlorambucil 2 MG tablet Commonly known as:  LEUKERAN Take 3 tablets (6 mg total) by mouth daily. Give on an empty stomach 1 hour before or 2 hours after meals.   ferrous sulfate 325 (65 FE) MG EC tablet Take 1 tablet by mouth a day with orange juice or vitamin C   folic acid 1 MG tablet Commonly known as:  FOLVITE take 1 tablet by mouth once daily   hydrALAZINE 25 MG tablet Commonly known as:  APRESOLINE   ibuprofen 600 MG tablet Commonly known as:  ADVIL,MOTRIN Take 600 mg by mouth every 6 (six) hours as needed.   IMBRUVICA 140 MG capsul Generic drug:  ibrutinib Take 3 capsules by mouth daily. Reported on 04/18/2016   lisinopril-hydrochlorothiazide 20-12.5 MG tablet Commonly known as:  PRINZIDE,ZESTORETIC Take 1 tablet by mouth daily.   metoprolol tartrate 25 MG tablet Commonly known as:  LOPRESSOR Take 25 mg by mouth 2 (two) times daily.   omeprazole 20 MG capsule Commonly known as:  PRILOSEC Take 20 mg by mouth daily.   polyethylene glycol packet Commonly known  as:  MIRALAX / GLYCOLAX Take 17 g by mouth daily as needed for mild constipation.   potassium chloride 10 MEQ tablet Commonly known as:  K-DUR take 1 tablet by mouth once daily       Allergies: No Known Allergies  Family History: Family History  Problem Relation Age of Onset  . Cancer Brother     throat ca  . Cancer Brother     bone cancer    Social History:  reports that she has never smoked. She has never used smokeless tobacco. She reports that she does not  drink alcohol or use drugs.  ROS: UROLOGY Frequent Urination?: No Hard to postpone urination?: No Burning/pain with urination?: No Get up at night to urinate?: No Leakage of urine?: No Urine stream starts and stops?: No Trouble starting stream?: No Do you have to strain to urinate?: No Blood in urine?: No Urinary tract infection?: No Sexually transmitted disease?: No Injury to kidneys or bladder?: No Painful intercourse?: No Weak stream?: No Currently pregnant?: No Vaginal bleeding?: No Last menstrual period?: n  Gastrointestinal Nausea?: No Vomiting?: No Indigestion/heartburn?: No Diarrhea?: No Constipation?: No  Constitutional Fever: No Night sweats?: No Weight loss?: No Fatigue?: No  Skin Skin rash/lesions?: No Itching?: No  Eyes Blurred vision?: No Double vision?: No  Ears/Nose/Throat Sore throat?: No Sinus problems?: No  Hematologic/Lymphatic Swollen glands?: No Easy bruising?: No  Cardiovascular Leg swelling?: No Chest pain?: No  Respiratory Cough?: No Shortness of breath?: No  Endocrine Excessive thirst?: No  Musculoskeletal Back pain?: No Joint pain?: No  Neurological Headaches?: No Dizziness?: No  Psychologic Depression?: No Anxiety?: No  Physical Exam: BP (!) 158/67 (BP Location: Left Arm, Patient Position: Sitting, Cuff Size: Large)   Pulse (!) 56   Ht 5\' 4"  (1.626 m)   Wt 180 lb 1.6 oz (81.7 kg)   BMI 30.91 kg/m   Constitutional:  Alert and oriented, No acute distress.  Unaccompanied today.  Well dressed with matching hat Jeralyn Bennett Nathen May. HEENT: [place] AT, moist mucus membranes.  Trachea midline, no masses. Cardiovascular: No clubbing, cyanosis, or edema. Respiratory: Normal respiratory effort, no increased work of breathing. GI: Abdomen is soft, nontender, nondistended, no abdominal masses GU: No CVA tenderness.  Skin: No rashes, bruises or suspicious lesions.. Neurologic: Grossly intact, no focal deficits, moving all 4  extremities. Psychiatric: Normal mood and affect.  Laboratory Data: Lab Results  Component Value Date   WBC 4.9 08/15/2016   HGB 12.3 08/15/2016   HCT 38.5 08/15/2016   MCV 79.1 (L) 08/15/2016   PLT 75 (L) 08/15/2016    Lab Results  Component Value Date   CREATININE 1.41 (H) 07/18/2016    Pertinent Imaging: CLINICAL DATA:  Microscopic hematuria, nephrolithiasis, hypertension, history lymphoma  EXAM: ABDOMEN - 1 VIEW  COMPARISON:  CT abdomen and pelvis 04/21/2016  FINDINGS: BILATERAL renal calculi.  Faint LEFT renal calculi, largest 11 x 9 mm projecting over lower pole LEFT kidney.  Numerous RIGHT renal calculi largest 27 x 17 mm projecting over renal pelvis.  Additional large upper to mid RIGHT renal calculus 19 x 16 mm.  No definite ureteral calcification.  Bowel gas pattern normal.  Bones demineralized.  Scattered atherosclerotic calcification.  IMPRESSION: Large BILATERAL renal calculi.   Electronically Signed   By: Lavonia Dana M.D.   On: 09/16/2016 09:02     KUB and previous CT scan personally reviewed today.   Assessment & Plan:    1. Microscopic hematuria Likely secondary to infiltrating lymphoma  involving left kidney and/or significant burden of nonobstructing nephrolithiasis. Deferring cystoscopy as would not want to intervene on findings  2. Nephrolithiasis Significant bilateral nonobstructing nephrolithiasis, right greater than left with near staghorn. This appears relatively stable over the past year and over past 3 month interval  Overall stone burden is quite concerning for complications such as eventual obstruction, infections, and renal compromise.    She continues to be asymptomatic, no issues with recurrent infections or flank pain. Creatinine remained stable. She will prefer to avoid any surgical intervention if possible. At this point, given her age and comorbidities, this seems like a reasonable approach.  Plan  for follow-up in 6 months with KUB.  She was advised to return sooner if she develops any of the aforementioned complications.  3. Chronic kidney disease, stage 3 (moderate) Stable chronic CK D, we'll continue to follow closely  4. 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 Possible source of microscopic hematuria.  Return in about 6 months (around 03/17/2017) for KUB, BMP .  Hollice Espy, MD  [facility] [address], [place], [place] [address] [phone]

## 2016-09-19 ENCOUNTER — Other Ambulatory Visit: Payer: Self-pay

## 2016-09-19 ENCOUNTER — Inpatient Hospital Stay: Payer: Medicare HMO

## 2016-09-19 ENCOUNTER — Inpatient Hospital Stay: Payer: Medicare HMO | Attending: Hematology and Oncology

## 2016-09-19 DIAGNOSIS — E538 Deficiency of other specified B group vitamins: Secondary | ICD-10-CM | POA: Diagnosis present

## 2016-09-19 DIAGNOSIS — D630 Anemia in neoplastic disease: Secondary | ICD-10-CM

## 2016-09-19 DIAGNOSIS — Z79899 Other long term (current) drug therapy: Secondary | ICD-10-CM | POA: Diagnosis not present

## 2016-09-19 LAB — CBC WITH DIFFERENTIAL/PLATELET
Basophils Absolute: 0.1 10*3/uL (ref 0–0.1)
Basophils Relative: 1 %
Eosinophils Absolute: 0.2 10*3/uL (ref 0–0.7)
Eosinophils Relative: 3 %
HCT: 38.6 % (ref 35.0–47.0)
Hemoglobin: 12.5 g/dL (ref 12.0–16.0)
Lymphocytes Relative: 18 %
Lymphs Abs: 1.3 10*3/uL (ref 1.0–3.6)
MCH: 25.9 pg — ABNORMAL LOW (ref 26.0–34.0)
MCHC: 32.4 g/dL (ref 32.0–36.0)
MCV: 80.1 fL (ref 80.0–100.0)
Monocytes Absolute: 0.8 10*3/uL (ref 0.2–0.9)
Monocytes Relative: 12 %
Neutro Abs: 4.6 10*3/uL (ref 1.4–6.5)
Neutrophils Relative %: 66 %
Platelets: 99 10*3/uL — ABNORMAL LOW (ref 150–440)
RBC: 4.82 MIL/uL (ref 3.80–5.20)
RDW: 16.8 % — ABNORMAL HIGH (ref 11.5–14.5)
WBC: 7.1 10*3/uL (ref 3.6–11.0)

## 2016-09-19 MED ORDER — CYANOCOBALAMIN 1000 MCG/ML IJ SOLN
1000.0000 ug | Freq: Once | INTRAMUSCULAR | Status: AC
  Administered 2016-09-19: 1000 ug via INTRAMUSCULAR
  Filled 2016-09-19: qty 1

## 2016-10-11 ENCOUNTER — Telehealth: Payer: Self-pay | Admitting: *Deleted

## 2016-10-11 NOTE — Telephone Encounter (Signed)
Pt called and said that she took her last imbruvica in near end of December but she has not got a supply from [facility].  She wanted me to check with cor[facility]an to see if she did not want her to have it anymore or what is going on.  Per Dr. Mike Gip she wants her to stay on it-no changes.  I called [facility] and spoke to staff and they have been trying to contact the pt and tried the home number alternate number and tried calling her daughter Daylene Katayama at home and cell.  I called the patient back and told her because they could not get in touch with her and they have been trying the pt as well as her daughter. I gave her th phone # to [facility] so she can call and speak to staff to get her next shipment of drug

## 2016-10-17 ENCOUNTER — Inpatient Hospital Stay: Payer: Medicare HMO

## 2016-10-17 ENCOUNTER — Inpatient Hospital Stay: Payer: Medicare HMO | Admitting: Hematology and Oncology

## 2016-10-24 ENCOUNTER — Encounter: Payer: Self-pay | Admitting: Hematology and Oncology

## 2016-10-24 ENCOUNTER — Inpatient Hospital Stay: Payer: Medicare HMO

## 2016-10-24 ENCOUNTER — Other Ambulatory Visit: Payer: Self-pay | Admitting: Hematology and Oncology

## 2016-10-24 ENCOUNTER — Inpatient Hospital Stay: Payer: Medicare HMO | Attending: Hematology and Oncology | Admitting: Hematology and Oncology

## 2016-10-24 VITALS — BP 168/69 | HR 49 | Temp 97.3°F | Wt 186.3 lb

## 2016-10-24 DIAGNOSIS — E538 Deficiency of other specified B group vitamins: Secondary | ICD-10-CM | POA: Insufficient documentation

## 2016-10-24 DIAGNOSIS — R918 Other nonspecific abnormal finding of lung field: Secondary | ICD-10-CM | POA: Insufficient documentation

## 2016-10-24 DIAGNOSIS — Z800 Family history of malignant neoplasm of digestive organs: Secondary | ICD-10-CM | POA: Insufficient documentation

## 2016-10-24 DIAGNOSIS — R590 Localized enlarged lymph nodes: Secondary | ICD-10-CM | POA: Insufficient documentation

## 2016-10-24 DIAGNOSIS — D509 Iron deficiency anemia, unspecified: Secondary | ICD-10-CM | POA: Diagnosis not present

## 2016-10-24 DIAGNOSIS — D630 Anemia in neoplastic disease: Secondary | ICD-10-CM | POA: Insufficient documentation

## 2016-10-24 DIAGNOSIS — C880 Waldenstrom macroglobulinemia: Secondary | ICD-10-CM | POA: Diagnosis not present

## 2016-10-24 DIAGNOSIS — N1330 Unspecified hydronephrosis: Secondary | ICD-10-CM | POA: Diagnosis not present

## 2016-10-24 DIAGNOSIS — Z808 Family history of malignant neoplasm of other organs or systems: Secondary | ICD-10-CM | POA: Diagnosis not present

## 2016-10-24 DIAGNOSIS — C8300 Small cell B-cell lymphoma, unspecified site: Secondary | ICD-10-CM

## 2016-10-24 DIAGNOSIS — D696 Thrombocytopenia, unspecified: Secondary | ICD-10-CM | POA: Diagnosis not present

## 2016-10-24 DIAGNOSIS — E785 Hyperlipidemia, unspecified: Secondary | ICD-10-CM | POA: Insufficient documentation

## 2016-10-24 DIAGNOSIS — Z79899 Other long term (current) drug therapy: Secondary | ICD-10-CM | POA: Diagnosis not present

## 2016-10-24 DIAGNOSIS — R319 Hematuria, unspecified: Secondary | ICD-10-CM | POA: Diagnosis not present

## 2016-10-24 DIAGNOSIS — I10 Essential (primary) hypertension: Secondary | ICD-10-CM | POA: Insufficient documentation

## 2016-10-24 DIAGNOSIS — D500 Iron deficiency anemia secondary to blood loss (chronic): Secondary | ICD-10-CM

## 2016-10-24 LAB — CBC WITH DIFFERENTIAL/PLATELET
Basophils Absolute: 0 10*3/uL (ref 0–0.1)
Basophils Relative: 2 %
Eosinophils Absolute: 0.1 10*3/uL (ref 0–0.7)
Eosinophils Relative: 3 %
HCT: 38.7 % (ref 35.0–47.0)
Hemoglobin: 12.4 g/dL (ref 12.0–16.0)
Lymphocytes Relative: 35 %
Lymphs Abs: 1.1 10*3/uL (ref 1.0–3.6)
MCH: 25.8 pg — ABNORMAL LOW (ref 26.0–34.0)
MCHC: 32.1 g/dL (ref 32.0–36.0)
MCV: 80.4 fL (ref 80.0–100.0)
Monocytes Absolute: 0.5 10*3/uL (ref 0.2–0.9)
Monocytes Relative: 15 %
Neutro Abs: 1.4 10*3/uL (ref 1.4–6.5)
Neutrophils Relative %: 45 %
Platelets: 76 10*3/uL — ABNORMAL LOW (ref 150–440)
RBC: 4.81 MIL/uL (ref 3.80–5.20)
RDW: 16.1 % — ABNORMAL HIGH (ref 11.5–14.5)
WBC: 3.2 10*3/uL — ABNORMAL LOW (ref 3.6–11.0)

## 2016-10-24 LAB — COMPREHENSIVE METABOLIC PANEL
ALT: 21 U/L (ref 14–54)
AST: 18 U/L (ref 15–41)
Albumin: 3.4 g/dL — ABNORMAL LOW (ref 3.5–5.0)
Alkaline Phosphatase: 48 U/L (ref 38–126)
Anion gap: 6 (ref 5–15)
BUN: 32 mg/dL — ABNORMAL HIGH (ref 6–20)
CO2: 29 mmol/L (ref 22–32)
Calcium: 9.1 mg/dL (ref 8.9–10.3)
Chloride: 102 mmol/L (ref 101–111)
Creatinine, Ser: 1.35 mg/dL — ABNORMAL HIGH (ref 0.44–1.00)
GFR calc Af Amer: 40 mL/min — ABNORMAL LOW (ref >60)
GFR calc non Af Amer: 35 mL/min — ABNORMAL LOW (ref >60)
Glucose, Bld: 96 mg/dL (ref 65–99)
Potassium: 3.9 mmol/L (ref 3.5–5.1)
Sodium: 137 mmol/L (ref 135–145)
Total Bilirubin: 0.5 mg/dL (ref 0.3–1.2)
Total Protein: 8.4 g/dL — ABNORMAL HIGH (ref 6.5–8.1)

## 2016-10-24 LAB — URIC ACID: Uric Acid, Serum: 5.2 mg/dL (ref 2.3–6.6)

## 2016-10-24 LAB — LACTATE DEHYDROGENASE: LDH: 139 U/L (ref 98–192)

## 2016-10-24 MED ORDER — CYANOCOBALAMIN 1000 MCG/ML IJ SOLN
1000.0000 ug | Freq: Once | INTRAMUSCULAR | Status: AC
  Administered 2016-10-24: 1000 ug via INTRAMUSCULAR
  Filled 2016-10-24: qty 1

## 2016-10-24 NOTE — Progress Notes (Signed)
[facility] day:  10/24/16  Chief Complaint: Kristy Solomon is a 83 y.o. female with lymphoplasmacytic lymphoma who is seen for 3 month assessment on Ibrutinib.  HPI: The patient was last seen in the medical oncology clinic on 07/18/2016.  At that time, she denied any concerns.  Blood pressure was poorly controlled.  She continued her monthly B12 (last 09/19/2016).  Labs on 07/18/2016 revealed a hematocrit of 40.7, hemoglobin 13.0, MCV 78.1, platelets 61,000, WBC 5100.  IgM was 3719.  Creatinine was 1.35 with a CrCl of 40 ml/min.  She saw Dr. Hollice Espy on 09/16/2016.  At that time she was doing well.  Microscopic hematuria was felt likely secondary to infiltrating lymphoma involving left kidney and/or significant burden of nonobstructing nephrolithiasis.  Plan was for follow-up in 6 months.  During the interim, she has continued to do well.  She denies any B symptoms.  She denied any adenopathy, bruising or bleeding, hematuria or infection.   Past Medical History:  Diagnosis Date  . 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 Hyperlipidemia   . Hypertension   . Malignant lymphoma, lymphoplasmacytic ([place]) 12/27/2013  . Renal insufficiency     Past Surgical History:  Procedure Laterality Date  . ABDOMINAL HYSTERECTOMY    . HEMORRHOID SURGERY      Family History  Problem Relation Age of Onset  . Cancer Brother     throat ca  . Cancer Brother     bone cancer    Social History:  reports that she has never smoked. She has never used smokeless tobacco. She reports that she does not drink alcohol or use drugs.  She lives in [place] with her grandson and his wife. Her emergency contact is Cecille Po, her grandaughter- [phone]).  [name]'s phone number [phone]).  She lives in [place].  She is alone today.  Allergies: No Known Allergies  Current Medications: Current Outpatient Prescriptions  Medication Sig Dispense  Refill  . allopurinol (ZYLOPRIM) 100 MG tablet take 2 tablets by mouth once daily 60 tablet 1  . amLODipine-olmesartan (AZOR) 5-40 MG tablet     . atorvastatin (LIPITOR) 20 MG tablet Take 20 mg by mouth daily.    . chlorambucil (LEUKERAN) 2 MG tablet Take 3 tablets (6 mg total) by mouth daily. Give on an empty stomach 1 hour before or 2 hours after meals. 21 tablet 0  . ferrous sulfate 325 (65 FE) MG EC tablet Take 1 tablet by mouth a day with orange juice or vitamin C 30 tablet 1  . folic acid (FOLVITE) 1 MG tablet take 1 tablet by mouth once daily 30 tablet 1  . gabapentin (NEURONTIN) 300 MG capsule     . hydrALAZINE (APRESOLINE) 25 MG tablet     . ibuprofen (ADVIL,MOTRIN) 600 MG tablet Take 600 mg by mouth every 6 (six) hours as needed.     . IMBRUVICA 140 MG capsul Take 3 capsules by mouth daily. Reported on 04/18/2016    . lisinopril-hydrochlorothiazide (PRINZIDE,ZESTORETIC) 20-12.5 MG per tablet Take 1 tablet by mouth daily.    . metoprolol tartrate (LOPRESSOR) 25 MG tablet Take 25 mg by mouth 2 (two) times daily.  0  . omeprazole (PRILOSEC) 20 MG capsule Take 20 mg by mouth daily.  0  . polyethylene glycol (MIRALAX / GLYCOLAX) packet Take 17 g by mouth daily as needed for mild constipation. 14 each 1  . potassium chloride (K-DUR) 10 MEQ tablet take 1 tablet  by mouth once daily 30 tablet 0   No current facility-administered medications for this visit.    Facility-Administered Medications Ordered in Other Visits  Medication Dose Route Frequency Provider Last Rate Last Dose  . cyanocobalamin ((VITAMIN B-12)) injection 1,000 mcg  1,000 mcg Intramuscular Once Lequita Asal, MD        Review of Systems:  GENERAL: Feels good. No fevers or sweats. Weight up 13 pounds. PERFORMANCE STATUS (ECOG): 2 HEENT: No runny nose, sore throat, mouth sores or tenderness. Lungs:  No shortness of breath or cough.  No wheezing.  No hemoptysis. Cardiac: No chest pain, palpitations, orthopnea, or  PND. GI: No nausea, vomiting, diarrhea, constipation, melena or hematochezia. GU: No urgency, frequency, dysuria or hematuria. Musculoskeletal: No back pain. No joint pain. No muscle tenderness. Extremities: No pain or swelling. Skin: No skin nodules. No rashes. Neuro: No headache, numbness or weakness, balance or coordination issues. Endocrine: No diabetes, thyroid issues, hot flashes or night sweats. Psych:No mood changes, depression or anxiety. Pain: No pain. Review of systems: All other systems reviewed and found to be negative.  Physical Exam: Blood pressure (!) 181/72, pulse (!) 49, temperature 97.3 F (36.3 C), temperature source Tympanic, weight 186 lb 4.6 oz (84.5 kg). GENERAL: Elderly woman sitting comfortably in the exam room in clinic in no acute distress. MENTAL STATUS: Alert and oriented to person, place and time. HEAD: Brown hair. Normocephalic, atraumatic, face symmetric, no Cushingoid features. EYES: Brown eyes. Pupils equal round and reactive to light and accomodation. No conjunctivitis or scleral icterus. ENT: Oropharynx clear without lesion. Tongue normal. Mucous membranes moist.  RESPIRATORY: Normal respiratory excursion. No rales, wheezes or rhonchi. CARDIOVASCULAR: Regular rate and rhythm without murmur, rub or gallop. ABDOMEN: Soft, non-tender with active bowel sounds and no appreciable hepatosplenomegaly. No guarding or rebound tenderness.  SKIN: No palpable nodules in subcutaneous tissues.  EXTREMITIES: No edema, no skin discoloration or tenderness. No palpable cords. LYMPH NODES: No palpable cervical, supraclavicular, axillary or inguinal adenopathy  NEUROLOGICAL: Unremarkable. PSYCH: Appropriate   Appointment on 10/24/2016  Component Date Value Ref Range Status  . WBC 10/24/2016 3.2* 3.6 - 11.0 K/uL Final  . RBC 10/24/2016 4.81  3.80 - 5.20 MIL/uL Final  . Hemoglobin 10/24/2016 12.4  12.0 - 16.0 g/dL Final  . HCT  10/24/2016 38.7  35.0 - 47.0 % Final  . MCV 10/24/2016 80.4  80.0 - 100.0 fL Final  . MCH 10/24/2016 25.8* 26.0 - 34.0 pg Final  . MCHC 10/24/2016 32.1  32.0 - 36.0 g/dL Final  . RDW 10/24/2016 16.1* 11.5 - 14.5 % Final  . Platelets 10/24/2016 76* 150 - 440 K/uL Final  . Neutrophils Relative % 10/24/2016 45  % Final  . Neutro Abs 10/24/2016 1.4  1.4 - 6.5 K/uL Final  . Lymphocytes Relative 10/24/2016 35  % Final  . Lymphs Abs 10/24/2016 1.1  1.0 - 3.6 K/uL Final  . Monocytes Relative 10/24/2016 15  % Final  . Monocytes Absolute 10/24/2016 0.5  0.2 - 0.9 K/uL Final  . Eosinophils Relative 10/24/2016 3  % Final  . Eosinophils Absolute 10/24/2016 0.1  0 - 0.7 K/uL Final  . Basophils Relative 10/24/2016 2  % Final  . Basophils Absolute 10/24/2016 0.0  0 - 0.1 K/uL Final  . Sodium 10/24/2016 137  135 - 145 mmol/L Final  . Potassium 10/24/2016 3.9  3.5 - 5.1 mmol/L Final  . Chloride 10/24/2016 102  101 - 111 mmol/L Final  . CO2 10/24/2016 29  22 - 32 mmol/L Final  . Glucose, Bld 10/24/2016 96  65 - 99 mg/dL Final  . BUN 10/24/2016 32* 6 - 20 mg/dL Final  . Creatinine, Ser 10/24/2016 1.35* 0.44 - 1.00 mg/dL Final  . Calcium 10/24/2016 9.1  8.9 - 10.3 mg/dL Final  . Total Protein 10/24/2016 8.4* 6.5 - 8.1 g/dL Final  . Albumin 10/24/2016 3.4* 3.5 - 5.0 g/dL Final  . AST 10/24/2016 18  15 - 41 U/L Final  . ALT 10/24/2016 21  14 - 54 U/L Final  . Alkaline Phosphatase 10/24/2016 48  38 - 126 U/L Final  . Total Bilirubin 10/24/2016 0.5  0.3 - 1.2 mg/dL Final  . GFR calc non Af Amer 10/24/2016 35* >60 mL/min Final  . GFR calc Af Amer 10/24/2016 40* >60 mL/min Final   Comment: (NOTE) The eGFR has been calculated using the CKD EPI equation. This calculation has not been validated in all clinical situations. eGFR's persistently <60 mL/min signify possible Chronic Kidney Disease.   . Anion gap 10/24/2016 6  5 - 15 Final  . LDH 10/24/2016 139  98 - 192 U/L Final    Assessment:  Kristy Solomon is a 83 y.o. 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 She presented with massive subcutaneous deposits, pulmonary nodules, involvement of left kidney, and bowel. Disease was unresponsive to Rituxan in 2014.   Chest, abdomen, and pelvic CT scan on 03/17/2016 revealed progression of diffuse lymphoma with subcutaneous deposits throughout the subcutaneous fat of the chest, abdomen,and pelvis. There were multiple nodular infiltrates throughout the lungs, likely due to lymphoma but could also represent infection or atypical infection. There was massive retroperitoneal lymphadenopathy with diffuse retroperitoneal, mesenteric, and pelvic lymphadenopathy. There was wall thickening and pelvicsmall bowel probably representing lymphomas involvement. There was probable direct invasion of the left kidney.  Biopsy of the dominant SQ nodal conglomeration around the right flank on 03/18/2016 revealed a persistent low-grade CD5 positive B-cell lymphoma.  Flow cytometry was positive for CD20, CD79a, CD5, CD138. Neoplastic cells were positive for kappa and cyclin D1.  CD 23 was negative.   Additional molecular markers are pending.  Bone marrow aspirate and biopsy on 03/18/2016 revealed no evidence of lymphoma or plasma cell neoplasm (0.2% kappa monoclonal plasma cells by flow analysis).  Marrow was hypercellular for age (50-60%) with trilineage hematopoiesis and no increased blasts. There was mild to moderate widespread increase in reticulin fibers. There was no storage iron detected.  The following studies were abnormal:  Beta2-microglobulin was 9.6 (0.6-2.4).  Serum viscosity was 5.5 (1.6-1.9).  Uric acid was 11.1 on 03/19/2016 and 5.7 on 05/23/2016.     Normal studies included:  hepatitis B surface antigen, hepatitis B core antibody total, hepatitis C antibody, HIV testing, and LDH (123).  G6PD assay was 9.2 (4.6-13.5).   She has iron deficiency (ferritin 27; iron  saturation 11%), B12 deficiency (147), and folate deficiency (5.9).  She has chronic renal insufficiency (BUN was 30-35, creatinine was 1.38 - 1.80 with a GFR 29-39 ml/min).  She has received B12 weekly (began 03/28/2016; last 06/20/2016).  She is on folic acid.   She received 1 week of chlorambucil with a tapering dose of prednisone (began 03/30/2016).  Cortisol was 9.5 on 05/17/2016.  She is currently day 45 of ibrutinib (began 04/26/2016).  She has mild thrombocytopenia (60,000).   SPEP has been followed:  5.0 gm/dL on 03/18/2016, 4.5 on 04/25/2016,  2.3 on 06/20/2016, and 2.0 on 10/24/2016.  IgM has been  followed:  > 5850 on 03/18/2016, > 5850 on 04/25/2016, 4652 on 06/09/2016, 719 on 07/18/2016, 3429 on 10/24/2016.  Chest, abdomen, and pelvic CT scan on 04/21/2016 revealed a partial response to therapy.  There were bilateral pulmonary nodules/masses, measuring up to 5.6 cm in the right lower lobe (improved).  There was trace left pleural effusion.  There was a 12.7 cm left renal/perirenal mass (decreased) with associated mild left hydronephrosis.  Retroperitoneal/left pelvic lymphadenopathy was mildly decreased.  Multifocal subcutaneous nodules, measuring up to 4.5 cm in the left lower anterior abdominal wall were stable to mildly improved.  Symptomatically, she feels good.  She denies any B symptoms.  Blood pressure remains elevated.  Exam is unremarkable. Uric acid is 5.2.  Plan: 1.  Labs today:  CBC with diff, CMP, IgM, uric acid. 2.  Continue ibrutinib 420 mg a day. 3.  B12 today 4.  RTC monthly x 2 for B12 5.  RTC in 2 weeks for labs (CBC with diff) 6.  RTC in 6 weeks for MD assessment and labs (CBC with diff, CMP, IgM)   Lequita Asal, MD  10/24/2016, 11:11 AM

## 2016-10-25 ENCOUNTER — Telehealth: Payer: Self-pay | Admitting: *Deleted

## 2016-10-25 LAB — PROTEIN ELECTROPHORESIS, SERUM
A/G Ratio: 0.7 (ref 0.7–1.7)
Albumin ELP: 3.3 g/dL (ref 2.9–4.4)
Alpha-1-Globulin: 0.2 g/dL (ref 0.0–0.4)
Alpha-2-Globulin: 0.6 g/dL (ref 0.4–1.0)
Beta Globulin: 1 g/dL (ref 0.7–1.3)
Gamma Globulin: 2.8 g/dL — ABNORMAL HIGH (ref 0.4–1.8)
Globulin, Total: 4.6 g/dL — ABNORMAL HIGH (ref 2.2–3.9)
M-Spike, %: 2 g/dL — ABNORMAL HIGH
PDF: 0
Total Protein ELP: 7.9 g/dL (ref 6.0–8.5)

## 2016-10-25 LAB — IGM: IgM, Serum: 3429 mg/dL — ABNORMAL HIGH (ref 26–217)

## 2016-10-25 NOTE — Telephone Encounter (Signed)
Called patient to inform her that her IgM is improving. Verbalized understanding.

## 2016-10-25 NOTE — Telephone Encounter (Signed)
-----   Message from Lequita Asal, MD sent at 10/25/2016  4:35 AM EST ----- Regarding: Please call patient with IgM result   ----- Message ----- From: Interface, Lab In [place] Sent: 10/24/2016  10:54 AM To: [facility] Provider Pool

## 2016-10-26 ENCOUNTER — Telehealth: Payer: Self-pay | Admitting: *Deleted

## 2016-10-26 NOTE — Telephone Encounter (Signed)
-----   Message from Lequita Asal, MD sent at 10/26/2016  4:31 AM EST ----- Regarding: Please call patient   Re:  decreasing M-spike.  M  ----- Message ----- From: Interface, Lab In [place] Sent: 10/24/2016  10:54 AM To: Lequita Asal, MD

## 2016-10-26 NOTE — Telephone Encounter (Signed)
Called patient to inform her that her M-spike is improving.  Verbalized understanding.

## 2016-11-04 ENCOUNTER — Telehealth: Payer: Self-pay | Admitting: *Deleted

## 2016-11-04 NOTE — Telephone Encounter (Signed)
Requesting to speak with Leana Roe, RN. Refuses to tell me why,just that she wants to discuss it with Tracie and asking for a call back within the next few minutes and then hung up the phone [phone]

## 2016-11-04 NOTE — Telephone Encounter (Signed)
Called patient who is asking that a copy of her appointment schedule be sent to her daughter, Rickard Patience.

## 2016-11-07 ENCOUNTER — Inpatient Hospital Stay: Payer: Medicare HMO | Attending: Hematology and Oncology

## 2016-11-07 DIAGNOSIS — E538 Deficiency of other specified B group vitamins: Secondary | ICD-10-CM | POA: Diagnosis present

## 2016-11-07 DIAGNOSIS — C8300 Small cell B-cell lymphoma, unspecified site: Secondary | ICD-10-CM

## 2016-11-07 LAB — CBC WITH DIFFERENTIAL/PLATELET
Basophils Absolute: 0 10*3/uL (ref 0–0.1)
Basophils Relative: 1 %
Eosinophils Absolute: 0.1 10*3/uL (ref 0–0.7)
Eosinophils Relative: 4 %
HCT: 38.1 % (ref 35.0–47.0)
Hemoglobin: 12.4 g/dL (ref 12.0–16.0)
Lymphocytes Relative: 41 %
Lymphs Abs: 1.5 10*3/uL (ref 1.0–3.6)
MCH: 26.3 pg (ref 26.0–34.0)
MCHC: 32.5 g/dL (ref 32.0–36.0)
MCV: 80.9 fL (ref 80.0–100.0)
Monocytes Absolute: 0.5 10*3/uL (ref 0.2–0.9)
Monocytes Relative: 14 %
Neutro Abs: 1.5 10*3/uL (ref 1.4–6.5)
Neutrophils Relative %: 40 %
Platelets: 81 10*3/uL — ABNORMAL LOW (ref 150–440)
RBC: 4.71 MIL/uL (ref 3.80–5.20)
RDW: 16.4 % — ABNORMAL HIGH (ref 11.5–14.5)
WBC: 3.7 10*3/uL (ref 3.6–11.0)

## 2016-11-09 ENCOUNTER — Other Ambulatory Visit: Payer: Self-pay | Admitting: Hematology and Oncology

## 2016-11-09 ENCOUNTER — Other Ambulatory Visit: Payer: Self-pay | Admitting: *Deleted

## 2016-11-09 NOTE — Telephone Encounter (Signed)
Okay to refill both per VO - Dr. Mike Gip.

## 2016-11-21 ENCOUNTER — Inpatient Hospital Stay: Payer: Medicare HMO

## 2016-11-21 DIAGNOSIS — E538 Deficiency of other specified B group vitamins: Secondary | ICD-10-CM

## 2016-11-21 MED ORDER — CYANOCOBALAMIN 1000 MCG/ML IJ SOLN
1000.0000 ug | Freq: Once | INTRAMUSCULAR | Status: AC
  Administered 2016-11-21: 1000 ug via INTRAMUSCULAR
  Filled 2016-11-21: qty 1

## 2016-12-05 ENCOUNTER — Ambulatory Visit: Payer: Medicare HMO | Admitting: Hematology and Oncology

## 2016-12-05 ENCOUNTER — Other Ambulatory Visit: Payer: Medicare HMO

## 2016-12-13 ENCOUNTER — Encounter: Payer: Self-pay | Admitting: Medical Oncology

## 2016-12-13 ENCOUNTER — Emergency Department
Admission: EM | Admit: 2016-12-13 | Discharge: 2016-12-13 | Disposition: A | Discharge status: Home / Self Care | Payer: Medicare HMO | Attending: Emergency Medicine | Admitting: Emergency Medicine

## 2016-12-13 DIAGNOSIS — I10 Essential (primary) hypertension: Secondary | ICD-10-CM | POA: Diagnosis not present

## 2016-12-13 DIAGNOSIS — M10071 Idiopathic gout, right ankle and foot: Secondary | ICD-10-CM | POA: Insufficient documentation

## 2016-12-13 DIAGNOSIS — Z79899 Other long term (current) drug therapy: Secondary | ICD-10-CM | POA: Diagnosis not present

## 2016-12-13 DIAGNOSIS — M79671 Pain in right foot: Secondary | ICD-10-CM | POA: Diagnosis present

## 2016-12-13 MED ORDER — OXYCODONE-ACETAMINOPHEN 5-325 MG PO TABS
1.0000 | ORAL_TABLET | Freq: Once | ORAL | Status: AC
  Administered 2016-12-13: 1 via ORAL
  Filled 2016-12-13: qty 1

## 2016-12-13 MED ORDER — ALLOPURINOL 100 MG PO TABS: 100.0000 mg | ORAL_TABLET | Freq: Every day | ORAL | 0 refills | Status: DC

## 2016-12-13 MED ORDER — ETODOLAC 200 MG PO CAPS
200.0000 mg | ORAL_CAPSULE | Freq: Two times a day (BID) | ORAL | 0 refills | Status: DC
Start: 2016-12-13 — End: 2017-12-28

## 2016-12-13 NOTE — ED Provider Notes (Signed)
[facility] Emergency Department Provider Note   ____________________________________________   None    (approximate)  I have reviewed the triage vital signs and the nursing notes.   HISTORY  Chief Complaint Gout    HPI Kristy Solomon is a 83 y.o. female patient complain about 3 days of redness, swelling, and pain to the right great toe.Patient has a history of gout. Patient states pain started after eating sausages for the last 2-3 days. Patient states she normally takes allopurinol but has been out of medication for over 2 weeks. Patient has not contacted her family doctor for refills of medications. Patient rates the pain as 8/10. Patient described a pain as "achy/sharp". No palliative measures for this complaint. Patient is accompanied by her son who states that she is noncompliant with food that triggers her gout attacks.  Past Medical History:  Diagnosis Date  . 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 Hyperlipidemia   . Hypertension   . Malignant lymphoma, lymphoplasmacytic ([place]) 12/27/2013  . Renal insufficiency     Patient Active Problem List   Diagnosis Date Noted  . B12 deficiency 03/18/2016  . Folate deficiency 03/18/2016  . Iron deficiency anemia 03/18/2016  . Pneumonia 03/17/2016  . Pressure ulcer 03/17/2016  . Thrombocytopenia ([place]) 07/04/2014  . Acute renal failure ([place]) 07/04/2014  . Hyperuricemia 07/04/2014  . 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 Malignant lymphoma, lymphoplasmacytic ([place]) 12/27/2013  . Calculi, ureter 07/03/2012  . Frank hematuria 07/03/2012  . Hydronephrosis 07/03/2012  . 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 Calculus of kidney 07/02/2012  . Nonspecific finding on examination of urine 07/02/2012    Past Surgical History:  Procedure Laterality Date  .  ABDOMINAL HYSTERECTOMY    . HEMORRHOID SURGERY      Prior to Admission medications   Medication Sig Start Date End Date Taking? Authorizing Provider  allopurinol (ZYLOPRIM) 100 MG tablet take 2 tablets by mouth once daily 11/09/16   Lequita Asal, MD  allopurinol (ZYLOPRIM) 100 MG tablet Take 1 tablet (100 mg total) by mouth daily. 12/13/16 12/13/17  Sable Feil, PA-C  amLODipine-olmesartan (AZOR) 5-40 MG tablet  08/16/16   Historical Provider, MD  atorvastatin (LIPITOR) 20 MG tablet Take 20 mg by mouth daily. 12/24/13   Historical Provider, MD  chlorambucil (LEUKERAN) 2 MG tablet Take 3 tablets (6 mg total) by mouth daily. Give on an empty stomach 1 hour before or 2 hours after meals. 03/28/16   Lequita Asal, MD  etodolac (LODINE) 200 MG capsule Take 1 capsule (200 mg total) by mouth 2 (two) times daily. 12/13/16   Sable Feil, PA-C  ferrous sulfate 325 (65 FE) MG EC tablet Take 1 tablet by mouth a day with orange juice or vitamin C 03/23/16   Lequita Asal, MD  folic acid (FOLVITE) 1 MG tablet take 1 tablet by mouth once daily 11/09/16   Lequita Asal, MD  gabapentin (NEURONTIN) 300 MG capsule  08/16/16   Historical Provider, MD  hydrALAZINE (APRESOLINE) 25 MG tablet  06/02/16   Historical Provider, MD  ibuprofen (ADVIL,MOTRIN) 600 MG tablet Take 600 mg by mouth every 6 (six) hours as needed.     Historical Provider, MD  IMBRUVICA 140 MG capsul Take 3 capsules by mouth daily. Reported on 04/18/2016 04/08/16   Historical Provider, MD  lisinopril-hydrochlorothiazide Reita May)  20-12.5 MG per tablet Take 1 tablet by mouth daily. 12/24/13   Historical Provider, MD  metoprolol tartrate (LOPRESSOR) 25 MG tablet Take 25 mg by mouth 2 (two) times daily. 03/05/16   Historical Provider, MD  omeprazole (PRILOSEC) 20 MG capsule Take 20 mg by mouth daily. 03/02/16   Historical Provider, MD  polyethylene glycol (MIRALAX / GLYCOLAX) packet Take 17 g by mouth daily as needed for mild  constipation. 04/04/16   Lequita Asal, MD  potassium chloride (K-DUR) 10 MEQ tablet take 1 tablet by mouth once daily 08/20/16   Lequita Asal, MD    Allergies Patient has no known allergies.  Family History  Problem Relation Age of Onset  . Cancer Brother     throat ca  . Cancer Brother     bone cancer    Social History Social History  Substance Use Topics  . Smoking status: Never Smoker  . Smokeless tobacco: Never Used  . Alcohol use No    Review of Systems Constitutional: No fever/chills Eyes: No visual changes. ENT: No sore throat. Cardiovascular: Denies chest pain. Respiratory: Denies shortness of breath. Gastrointestinal: No abdominal pain.  No nausea, no vomiting.  No diarrhea.  No constipation. Genitourinary: Negative for dysuria. Musculoskeletal: Right great toe pain Skin: Negative for rash. Neurological: Negative for headaches, focal weakness or numbness. Endocrine:Hypertension and hyperlipidemia. Hematological/Lymphatic:Anemia ____________________________________________   PHYSICAL EXAM:  VITAL SIGNS: ED Triage Vitals  Enc Vitals Group     BP 12/13/16 0856 (!) 184/60     Pulse Rate 12/13/16 0856 (!) 53     Resp 12/13/16 0856 16     Temp 12/13/16 0856 97.4 F (36.3 C)     Temp Source 12/13/16 0856 Oral     SpO2 12/13/16 0856 100 %     Weight 12/13/16 0852 186 lb (84.4 kg)     Height --      Head Circumference --      Peak Flow --      Pain Score 12/13/16 0852 10     Pain Loc --      Pain Edu? --      Excl. in [place]? --     Constitutional: Alert and oriented.Moderate distress Eyes: Conjunctivae are normal. PERRL. EOMI. Head: Atraumatic. Nose: No congestion/rhinnorhea. Mouth/Throat: Mucous membranes are moist.  Oropharynx non-erythematous. Neck: No stridor.  No cervical spine tenderness to palpation. Hematological/Lymphatic/Immunilogical: No cervical lymphadenopathy. Cardiovascular: Normal rate, regular rhythm. Grossly normal heart  sounds.  Good peripheral circulation. Elevated blood pressure Respiratory: Normal respiratory effort.  No retractions. Lungs CTAB. Gastrointestinal: Soft and nontender. No distention. No abdominal bruits. No CVA tenderness. Musculoskeletal:  edema and erythema to the right great toe. Neurologic:  Normal speech and language. No gross focal neurologic deficits are appreciated. No gait instability. Skin:  Skin is warm, dry and intact. No rash noted. Psychiatric: Mood and affect are normal. Speech and behavior are normal.  ____________________________________________   LABS (all labs ordered are listed, but only abnormal results are displayed)  Labs Reviewed - No data to display ____________________________________________  EKG   ____________________________________________  RADIOLOGY   ____________________________________________   PROCEDURES  Procedure(s) performed: None  Procedures  Critical Care performed: No  ____________________________________________   INITIAL IMPRESSION / ASSESSMENT AND PLAN / ED COURSE  Pertinent labs & imaging results that were available during my care of the patient were reviewed by me and considered in my medical decision making (see chart for details).  Gout right great toe. Patient given  discharge care instructions. Patient given a prescription for Indocin and allopurinol. Patient advised follow-up with family doctor one week for reevaluation.      ____________________________________________   FINAL CLINICAL IMPRESSION(S) / ED DIAGNOSES  Final diagnoses:  Acute idiopathic gout involving toe of right foot      NEW MEDICATIONS STARTED DURING THIS VISIT:  New Prescriptions   ALLOPURINOL (ZYLOPRIM) 100 MG TABLET    Take 1 tablet (100 mg total) by mouth daily.   ETODOLAC (LODINE) 200 MG CAPSULE    Take 1 capsule (200 mg total) by mouth 2 (two) times daily.     Note:  This document was prepared using Dragon voice recognition  software and may include unintentional dictation errors.    Sable Feil, PA-C 12/13/16 [place], MD 12/13/16 [phone]

## 2016-12-13 NOTE — ED Triage Notes (Signed)
Pt reports rt foot great toe gout pain, hx of same.

## 2016-12-13 NOTE — ED Notes (Signed)
See triage note  States she has a hx of gout  Developed increased pain to right great toe  Positive redness and swelling noted

## 2016-12-19 ENCOUNTER — Inpatient Hospital Stay (HOSPITAL_BASED_OUTPATIENT_CLINIC_OR_DEPARTMENT_OTHER): Payer: Medicare HMO | Admitting: Hematology and Oncology

## 2016-12-19 ENCOUNTER — Inpatient Hospital Stay: Payer: Medicare HMO | Attending: Hematology and Oncology

## 2016-12-19 ENCOUNTER — Other Ambulatory Visit: Payer: Self-pay | Admitting: *Deleted

## 2016-12-19 ENCOUNTER — Other Ambulatory Visit: Payer: Self-pay | Admitting: Hematology and Oncology

## 2016-12-19 ENCOUNTER — Ambulatory Visit: Payer: Medicare HMO

## 2016-12-19 ENCOUNTER — Encounter: Payer: Self-pay | Admitting: Hematology and Oncology

## 2016-12-19 ENCOUNTER — Inpatient Hospital Stay: Payer: Medicare HMO

## 2016-12-19 VITALS — BP 178/76 | HR 54 | Temp 97.4°F | Resp 18 | Ht 64.0 in | Wt 184.5 lb

## 2016-12-19 DIAGNOSIS — D630 Anemia in neoplastic disease: Secondary | ICD-10-CM | POA: Diagnosis not present

## 2016-12-19 DIAGNOSIS — I129 Hypertensive chronic kidney disease with stage 1 through stage 4 chronic kidney disease, or unspecified chronic kidney disease: Secondary | ICD-10-CM | POA: Insufficient documentation

## 2016-12-19 DIAGNOSIS — N189 Chronic kidney disease, unspecified: Secondary | ICD-10-CM | POA: Diagnosis not present

## 2016-12-19 DIAGNOSIS — D696 Thrombocytopenia, unspecified: Secondary | ICD-10-CM | POA: Diagnosis not present

## 2016-12-19 DIAGNOSIS — E785 Hyperlipidemia, unspecified: Secondary | ICD-10-CM | POA: Diagnosis not present

## 2016-12-19 DIAGNOSIS — M109 Gout, unspecified: Secondary | ICD-10-CM

## 2016-12-19 DIAGNOSIS — R319 Hematuria, unspecified: Secondary | ICD-10-CM

## 2016-12-19 DIAGNOSIS — R918 Other nonspecific abnormal finding of lung field: Secondary | ICD-10-CM | POA: Insufficient documentation

## 2016-12-19 DIAGNOSIS — Z8572 Personal history of non-Hodgkin lymphomas: Secondary | ICD-10-CM

## 2016-12-19 DIAGNOSIS — E538 Deficiency of other specified B group vitamins: Secondary | ICD-10-CM

## 2016-12-19 DIAGNOSIS — C8300 Small cell B-cell lymphoma, unspecified site: Secondary | ICD-10-CM

## 2016-12-19 DIAGNOSIS — C880 Waldenstrom macroglobulinemia: Secondary | ICD-10-CM | POA: Insufficient documentation

## 2016-12-19 DIAGNOSIS — R630 Anorexia: Secondary | ICD-10-CM

## 2016-12-19 DIAGNOSIS — R200 Anesthesia of skin: Secondary | ICD-10-CM

## 2016-12-19 DIAGNOSIS — Z79899 Other long term (current) drug therapy: Secondary | ICD-10-CM | POA: Diagnosis not present

## 2016-12-19 DIAGNOSIS — Z8579 Personal history of other malignant neoplasms of lymphoid, hematopoietic and related tissues: Secondary | ICD-10-CM

## 2016-12-19 LAB — CBC WITH DIFFERENTIAL/PLATELET
Basophils Absolute: 0 10*3/uL (ref 0–0.1)
Basophils Relative: 1 %
Eosinophils Absolute: 0.1 10*3/uL (ref 0–0.7)
Eosinophils Relative: 2 %
HCT: 41.5 % (ref 35.0–47.0)
Hemoglobin: 13.4 g/dL (ref 12.0–16.0)
Lymphocytes Relative: 29 %
Lymphs Abs: 1.1 10*3/uL (ref 1.0–3.6)
MCH: 26.1 pg (ref 26.0–34.0)
MCHC: 32.3 g/dL (ref 32.0–36.0)
MCV: 80.9 fL (ref 80.0–100.0)
Monocytes Absolute: 0.5 10*3/uL (ref 0.2–0.9)
Monocytes Relative: 13 %
Neutro Abs: 2 10*3/uL (ref 1.4–6.5)
Neutrophils Relative %: 55 %
Platelets: 94 10*3/uL — ABNORMAL LOW (ref 150–440)
RBC: 5.13 MIL/uL (ref 3.80–5.20)
RDW: 16.7 % — ABNORMAL HIGH (ref 11.5–14.5)
WBC: 3.7 10*3/uL (ref 3.6–11.0)

## 2016-12-19 LAB — COMPREHENSIVE METABOLIC PANEL
ALT: 32 U/L (ref 14–54)
AST: 29 U/L (ref 15–41)
Albumin: 3.3 g/dL — ABNORMAL LOW (ref 3.5–5.0)
Alkaline Phosphatase: 54 U/L (ref 38–126)
Anion gap: 5 (ref 5–15)
BUN: 41 mg/dL — ABNORMAL HIGH (ref 6–20)
CO2: 26 mmol/L (ref 22–32)
Calcium: 8.9 mg/dL (ref 8.9–10.3)
Chloride: 107 mmol/L (ref 101–111)
Creatinine, Ser: 1.34 mg/dL — ABNORMAL HIGH (ref 0.44–1.00)
GFR calc Af Amer: 41 mL/min — ABNORMAL LOW (ref >60)
GFR calc non Af Amer: 35 mL/min — ABNORMAL LOW (ref >60)
Glucose, Bld: 103 mg/dL — ABNORMAL HIGH (ref 65–99)
Potassium: 3.6 mmol/L (ref 3.5–5.1)
Sodium: 138 mmol/L (ref 135–145)
Total Bilirubin: 0.5 mg/dL (ref 0.3–1.2)
Total Protein: 8.1 g/dL (ref 6.5–8.1)

## 2016-12-19 MED ORDER — CYANOCOBALAMIN 1000 MCG/ML IJ SOLN
1000.0000 ug | Freq: Once | INTRAMUSCULAR | Status: AC
Start: 2016-12-19 — End: 2016-12-19
  Administered 2016-12-19: 1000 ug via INTRAMUSCULAR
  Filled 2016-12-19: qty 1

## 2016-12-19 NOTE — Progress Notes (Signed)
[facility]  Clinic day:  12/19/16  Chief Complaint: Kristy Solomon is a 83 y.o. female with lymphoplasmacytic lymphoma who is seen for 2 month assessment on Ibrutinib.  HPI: The patient was last seen in the medical oncology clinic on 10/24/2016.  At that time, she denied any concerns.  Blood pressure was poorly controlled.  She received her B12 during this visit.  She continued Ibrutinib.  She saw Dr. Vanna Scotland on 09/16/2016.  At that time she was doing well.  Microscopic hematuria was felt likely secondary to infiltrating lymphoma involving left kidney and/or significant burden of non-obstructing nephrolithiasis.  Plan was for follow-up in 6 months.  During the interim, she has continued to do well.  She denies any B symptoms.  She denied any adenopathy, bruising or bleeding, hematuria or infection.  She does report chronic tingling in her right hand radiating up her right arm.  She also reports right great toe gout, followed by her PCP.   Past Medical History:  Diagnosis Date  . 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 Hyperlipidemia   . Hypertension   . Malignant lymphoma, lymphoplasmacytic (HCC) 12/27/2013  . Renal insufficiency     Past Surgical History:  Procedure Laterality Date  . ABDOMINAL HYSTERECTOMY    . HEMORRHOID SURGERY      Family History  Problem Relation Age of Onset  . Cancer Brother     throat ca  . Cancer Brother     bone cancer    Social History:  reports that she has never smoked. She has never used smokeless tobacco. She reports that she does not drink alcohol or use drugs.  She lives in [place] with her grandson and his wife. Her emergency contact is Thayer Headings, her grandaughter- [phone]).  [name]'s phone number [phone]).  She lives in [place].  She is alone today.  Allergies: No Known Allergies  Current Medications: Current Outpatient Prescriptions  Medication Sig Dispense Refill  .  allopurinol (ZYLOPRIM) 100 MG tablet take 2 tablets by mouth once daily 60 tablet 1  . allopurinol (ZYLOPRIM) 100 MG tablet Take 1 tablet (100 mg total) by mouth daily. (Patient taking differently: Take 100 mg by mouth at bedtime. ) 30 tablet 0  . amLODipine-olmesartan (AZOR) 5-40 MG tablet     . atorvastatin (LIPITOR) 20 MG tablet Take 20 mg by mouth daily.    . chlorambucil (LEUKERAN) 2 MG tablet Take 3 tablets (6 mg total) by mouth daily. Give on an empty stomach 1 hour before or 2 hours after meals. 21 tablet 0  . etodolac (LODINE) 200 MG capsule Take 1 capsule (200 mg total) by mouth 2 (two) times daily. 10 capsule 0  . ferrous sulfate 325 (65 FE) MG EC tablet Take 1 tablet by mouth a day with orange juice or vitamin C 30 tablet 1  . folic acid (FOLVITE) 1 MG tablet take 1 tablet by mouth once daily 30 tablet 1  . gabapentin (NEURONTIN) 300 MG capsule     . hydrALAZINE (APRESOLINE) 25 MG tablet     . ibuprofen (ADVIL,MOTRIN) 600 MG tablet Take 600 mg by mouth every 6 (six) hours as needed.     . IMBRUVICA 140 MG capsul Take 3 capsules by mouth daily. Reported on 04/18/2016    . lisinopril-hydrochlorothiazide (PRINZIDE,ZESTORETIC) 20-12.5 MG per tablet Take 1 tablet by mouth daily.    . metoprolol tartrate (LOPRESSOR) 25 MG tablet Take 25 mg by mouth 2 (two)  times daily.  0  . omeprazole (PRILOSEC) 20 MG capsule Take 20 mg by mouth daily.  0  . polyethylene glycol (MIRALAX / GLYCOLAX) packet Take 17 g by mouth daily as needed for mild constipation. 14 each 1  . potassium chloride (K-DUR) 10 MEQ tablet take 1 tablet by mouth once daily (Patient not taking: Reported on 12/19/2016) 30 tablet 0   No current facility-administered medications for this visit.    Facility-Administered Medications Ordered in Other Visits  Medication Dose Route Frequency Provider Last Rate Last Dose  . cyanocobalamin ((VITAMIN B-12)) injection 1,000 mcg  1,000 mcg Intramuscular Once Lequita Asal, MD         Review of Systems:  GENERAL: Feels good. No fevers or sweats. Weight down 2 pounds (intentional). PERFORMANCE STATUS (ECOG): 2 HEENT: No runny nose, sore throat, mouth sores or tenderness. Lungs:  No shortness of breath or cough.  No wheezing.  No hemoptysis. Cardiac: No chest pain, palpitations, orthopnea, or PND. GI: No nausea, vomiting, diarrhea, constipation, melena or hematochezia. GU: No urgency, frequency, dysuria or hematuria. Musculoskeletal: No back pain. No joint pain. No muscle tenderness. Extremities: No pain or swelling. Skin: No skin nodules. No rashes. Neuro: Right hand and arm tingling.  No headache, numbness or weakness, balance or coordination issues. Endocrine: No diabetes, thyroid issues, hot flashes or night sweats. Psych:No mood changes, depression or anxiety. Pain: No pain. Review of systems: All other systems reviewed and found to be negative.  Physical Exam: Blood pressure (!) 178/76, pulse (!) 54, temperature 97.4 F (36.3 C), temperature source Tympanic, resp. rate 18, height _0  (1.626 m), weight 184 lb 8.4 oz (83.7 kg). GENERAL: Elderly woman sitting comfortably in the exam room in clinic in no acute distress. MENTAL STATUS: Alert and oriented to person, place and time. HEAD: Brown hair. Normocephalic, atraumatic, face symmetric, no Cushingoid features. EYES: Brown eyes. Pupils equal round and reactive to light and accomodation. No conjunctivitis or scleral icterus. ENT: Oropharynx clear without lesion. Tongue normal. Mucous membranes moist.  RESPIRATORY: Normal respiratory excursion. No rales, wheezes or rhonchi. CARDIOVASCULAR: Regular rate and rhythm without murmur, rub or gallop. ABDOMEN: Soft, non-tender with active bowel sounds and no appreciable hepatosplenomegaly. No guarding or rebound tenderness.  SKIN: No palpable nodules in subcutaneous tissues.  EXTREMITIES: No edema, no skin discoloration or  tenderness. No palpable cords. LYMPH NODES: No palpable cervical, supraclavicular, axillary or inguinal adenopathy  NEUROLOGICAL: Unremarkable. PSYCH: Appropriate   Appointment on 12/19/2016  Component Date Value Ref Range Status  . WBC 12/19/2016 3.7  3.6 - 11.0 K/uL Final  . RBC 12/19/2016 5.13  3.80 - 5.20 MIL/uL Final  . Hemoglobin 12/19/2016 13.4  12.0 - 16.0 g/dL Final  . HCT 12/19/2016 41.5  35.0 - 47.0 % Final  . MCV 12/19/2016 80.9  80.0 - 100.0 fL Final  . MCH 12/19/2016 26.1  26.0 - 34.0 pg Final  . MCHC 12/19/2016 32.3  32.0 - 36.0 g/dL Final  . RDW 12/19/2016 16.7* 11.5 - 14.5 % Final  . Platelets 12/19/2016 94* 150 - 440 K/uL Final  . Neutrophils Relative % 12/19/2016 55  % Final  . Neutro Abs 12/19/2016 2.0  1.4 - 6.5 K/uL Final  . Lymphocytes Relative 12/19/2016 29  % Final  . Lymphs Abs 12/19/2016 1.1  1.0 - 3.6 K/uL Final  . Monocytes Relative 12/19/2016 13  % Final  . Monocytes Absolute 12/19/2016 0.5  0.2 - 0.9 K/uL Final  . Eosinophils Relative 12/19/2016 2  %  Final  . Eosinophils Absolute 12/19/2016 0.1  0 - 0.7 K/uL Final  . Basophils Relative 12/19/2016 1  % Final  . Basophils Absolute 12/19/2016 0.0  0 - 0.1 K/uL Final  . Sodium 12/19/2016 138  135 - 145 mmol/L Final  . Potassium 12/19/2016 3.6  3.5 - 5.1 mmol/L Final  . Chloride 12/19/2016 107  101 - 111 mmol/L Final  . CO2 12/19/2016 26  22 - 32 mmol/L Final  . Glucose, Bld 12/19/2016 103* 65 - 99 mg/dL Final  . BUN 12/19/2016 41* 6 - 20 mg/dL Final  . Creatinine, Ser 12/19/2016 1.34* 0.44 - 1.00 mg/dL Final  . Calcium 12/19/2016 8.9  8.9 - 10.3 mg/dL Final  . Total Protein 12/19/2016 8.1  6.5 - 8.1 g/dL Final  . Albumin 12/19/2016 3.3* 3.5 - 5.0 g/dL Final  . AST 12/19/2016 29  15 - 41 U/L Final  . ALT 12/19/2016 32  14 - 54 U/L Final  . Alkaline Phosphatase 12/19/2016 54  38 - 126 U/L Final  . Total Bilirubin 12/19/2016 0.5  0.3 - 1.2 mg/dL Final  . GFR calc non Af Amer 12/19/2016 35* >60 mL/min  Final  . GFR calc Af Amer 12/19/2016 41* >60 mL/min Final   Comment: (NOTE) The eGFR has been calculated using the CKD EPI equation. This calculation has not been validated in all clinical situations. eGFR's persistently <60 mL/min signify possible Chronic Kidney Disease.   . Anion gap 12/19/2016 5  5 - 15 Final    Assessment:  Kristy Solomon is a 83 y.o. 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 She presented with massive subcutaneous deposits, pulmonary nodules, involvement of left kidney, and bowel. Disease was unresponsive to Rituxan in 2014.   Chest, abdomen, and pelvic CT scan on 03/17/2016 revealed progression of diffuse lymphoma with subcutaneous deposits throughout the subcutaneous fat of the chest, abdomen,and pelvis. There were multiple nodular infiltrates throughout the lungs, likely due to lymphoma but could also represent infection or atypical infection. There was massive retroperitoneal lymphadenopathy with diffuse retroperitoneal, mesenteric, and pelvic lymphadenopathy. There was wall thickening and pelvicsmall bowel probably representing lymphomas involvement. There was probable direct invasion of the left kidney.  Biopsy of the dominant SQ nodal conglomeration around the right flank on 03/18/2016 revealed a persistent low-grade CD5 positive B-cell lymphoma.  Flow cytometry was positive for CD20, CD79a, CD5, CD138. Neoplastic cells were positive for kappa and cyclin D1.  CD 23 was negative.   Additional molecular markers are pending.  Bone marrow aspirate and biopsy on 03/18/2016 revealed no evidence of lymphoma or plasma cell neoplasm (0.2% kappa monoclonal plasma cells by flow analysis).  Marrow was hypercellular for age (50-60%) with trilineage hematopoiesis and no increased blasts. There was mild to moderate widespread increase in reticulin fibers. There was no storage iron detected.  The following studies were abnormal:   Beta2-microglobulin was 9.6 (0.6-2.4).  Serum viscosity was 5.5 (1.6-1.9).  Uric acid was 11.1 on 03/19/2016 and 5.7 on 05/23/2016.     Normal studies included:  hepatitis B surface antigen, hepatitis B core antibody total, hepatitis C antibody, HIV testing, and LDH (123).  G6PD assay was 9.2 (4.6-13.5).   She has iron deficiency (ferritin 27; iron saturation 11%), B12 deficiency (147), and folate deficiency (5.9).  She has chronic renal insufficiency (BUN was 30-35, creatinine was 1.38 - 1.80 with a GFR 29-39 ml/min).  She has received B12 weekly (began 03/28/2016; last 06/20/2016).  She is on folic acid.  She received 1 week of chlorambucil with a tapering dose of prednisone (began 03/30/2016).  Cortisol was 9.5 on 05/17/2016.  She is currently day 45 of ibrutinib (began 04/26/2016).  She has mild thrombocytopenia (94,000).   SPEP has been followed:  5.0 gm/dL on 03/18/2016, 4.5 on 04/25/2016,  2.3 on 06/20/2016, 2.0 on 10/24/2016, and 1.5 on 12/19/2016.  IgM has been followed:  > 5850 on 03/18/2016, > 5850 on 04/25/2016, 4652 on 06/09/2016, 3719 on 07/18/2016, 3429 on 10/24/2016, and 2821 on 12/19/2016.  Chest, abdomen, and pelvic CT scan on 04/21/2016 revealed a partial response to therapy.  There were bilateral pulmonary nodules/masses, measuring up to 5.6 cm in the right lower lobe (improved).  There was trace left pleural effusion.  There was a 12.7 cm left renal/perirenal mass (decreased) with associated mild left hydronephrosis.  Retroperitoneal/left pelvic lymphadenopathy was mildly decreased.  Multifocal subcutaneous nodules, measuring up to 4.5 cm in the left lower anterior abdominal wall were stable to mildly improved.  Symptomatically, she feels good.  She denies any B symptoms.  Blood pressure remains elevated.  Exam is unremarkable.  Plan: 1.  Labs today:  CBC with diff, CMP, SPEP, IgM. 2.  Continue ibrutinib 420 mg a day. 3.  B12 today 4.  RTC monthly x 2 for B12 5.  RTC in 6  weeks for labs (CBC with diff) 6.  RTC in 3 months for MD assessment, labs (CBC with diff, CMP, SPEP, IgM), and B12.   Lucendia Herrlich, NP   I saw and evaluated the patient, participating in the key portions of the service and reviewing pertinent diagnostic studies and records.  I reviewed the nurse practitioner's note and agree with the findings and the plan.  The assessment and plan were discussed with the patient.  A few questions were asked by the patient and answered.   Lequita Asal, MD 12/19/2016, 4:53 PM

## 2016-12-19 NOTE — Progress Notes (Signed)
Pt taking meds for corcoran without c/o. She did not take her mornign b/p pill today. She is having more tingling in right hand and sometimes up her arm.

## 2016-12-20 LAB — PROTEIN ELECTROPHORESIS, SERUM
A/G Ratio: 0.9 (ref 0.7–1.7)
Albumin ELP: 3.4 g/dL (ref 2.9–4.4)
Alpha-1-Globulin: 0.2 g/dL (ref 0.0–0.4)
Alpha-2-Globulin: 0.6 g/dL (ref 0.4–1.0)
Beta Globulin: 0.9 g/dL (ref 0.7–1.3)
Gamma Globulin: 2.4 g/dL — ABNORMAL HIGH (ref 0.4–1.8)
Globulin, Total: 4 g/dL — ABNORMAL HIGH (ref 2.2–3.9)
M-Spike, %: 1.5 g/dL — ABNORMAL HIGH
Total Protein ELP: 7.4 g/dL (ref 6.0–8.5)

## 2016-12-20 LAB — IGM: IgM, Serum: 2821 mg/dL — ABNORMAL HIGH (ref 26–217)

## 2017-01-02 ENCOUNTER — Other Ambulatory Visit: Payer: Medicare HMO

## 2017-01-04 ENCOUNTER — Other Ambulatory Visit: Payer: Self-pay | Admitting: Hematology and Oncology

## 2017-01-16 ENCOUNTER — Ambulatory Visit: Payer: Medicare HMO

## 2017-01-16 ENCOUNTER — Inpatient Hospital Stay: Payer: Medicare HMO | Attending: Hematology and Oncology

## 2017-01-16 ENCOUNTER — Other Ambulatory Visit: Payer: Medicare HMO

## 2017-01-16 DIAGNOSIS — E538 Deficiency of other specified B group vitamins: Secondary | ICD-10-CM | POA: Insufficient documentation

## 2017-01-16 DIAGNOSIS — Z79899 Other long term (current) drug therapy: Secondary | ICD-10-CM | POA: Insufficient documentation

## 2017-01-16 MED ORDER — CYANOCOBALAMIN 1000 MCG/ML IJ SOLN
1000.0000 ug | Freq: Once | INTRAMUSCULAR | Status: AC
  Administered 2017-01-16: 1000 ug via INTRAMUSCULAR
  Filled 2017-01-16: qty 1

## 2017-01-19 ENCOUNTER — Ambulatory Visit: Payer: Medicare HMO

## 2017-01-19 ENCOUNTER — Telehealth: Payer: Self-pay | Admitting: *Deleted

## 2017-01-19 NOTE — Telephone Encounter (Signed)
Called patient and discussed that the imbruvica was now 1 pill a day instead of 3 and that she had to call biologics to have it shipped since she missed their call.  Number for biologics given to patient voiced understanding.

## 2017-01-30 ENCOUNTER — Telehealth: Payer: Self-pay | Admitting: *Deleted

## 2017-01-30 ENCOUNTER — Other Ambulatory Visit: Payer: Self-pay | Admitting: *Deleted

## 2017-01-30 ENCOUNTER — Inpatient Hospital Stay: Payer: Medicare HMO

## 2017-01-30 DIAGNOSIS — Z8579 Personal history of other malignant neoplasms of lymphoid, hematopoietic and related tissues: Secondary | ICD-10-CM

## 2017-01-30 DIAGNOSIS — C8280 Other types of follicular lymphoma, unspecified site: Secondary | ICD-10-CM

## 2017-01-30 DIAGNOSIS — E538 Deficiency of other specified B group vitamins: Secondary | ICD-10-CM | POA: Diagnosis not present

## 2017-01-30 LAB — CBC WITH DIFFERENTIAL/PLATELET
Basophils Absolute: 0.1 10*3/uL (ref 0–0.1)
Basophils Relative: 2 %
Eosinophils Absolute: 0.1 10*3/uL (ref 0–0.7)
Eosinophils Relative: 4 %
HCT: 40.8 % (ref 35.0–47.0)
Hemoglobin: 13.2 g/dL (ref 12.0–16.0)
Lymphocytes Relative: 33 %
Lymphs Abs: 0.9 10*3/uL — ABNORMAL LOW (ref 1.0–3.6)
MCH: 26.2 pg (ref 26.0–34.0)
MCHC: 32.3 g/dL (ref 32.0–36.0)
MCV: 81.2 fL (ref 80.0–100.0)
Monocytes Absolute: 0.4 10*3/uL (ref 0.2–0.9)
Monocytes Relative: 13 %
Neutro Abs: 1.3 10*3/uL — ABNORMAL LOW (ref 1.4–6.5)
Neutrophils Relative %: 48 %
Platelets: 74 10*3/uL — ABNORMAL LOW (ref 150–440)
RBC: 5.03 MIL/uL (ref 3.80–5.20)
RDW: 16.8 % — ABNORMAL HIGH (ref 11.5–14.5)
WBC: 2.7 10*3/uL — ABNORMAL LOW (ref 3.6–11.0)

## 2017-01-30 NOTE — Telephone Encounter (Signed)
Patient called - states she has not changed anything.  Informed her that her WBC is a little lower.  She says the Abreva that she takes in now in one pill instead of 3 pills.  That is the only change.  Verified phone numbers with her.  Also informed her that she has another lab appointment on Monday, May 7th @ 11:30.  Verbalized understanding.

## 2017-01-30 NOTE — Telephone Encounter (Signed)
Attempted to call patient regarding lab results.  All numbers for this patient have been disconnected or no longer in service.  Also attempted to call the granddaugher who is the emergency contact.  That number has also been disconnected or no longer in service.

## 2017-01-30 NOTE — Telephone Encounter (Signed)
-----   Message from Lequita Asal, MD sent at 01/30/2017  3:14 PM EDT ----- Regarding: Please call patient  Counts a little bit lower today than usual.  Any new medications or changes?  Let's recheck CBC with diff next week.  M  ----- Message ----- From: Interface, Lab In [place] Sent: 01/30/2017  10:54 AM To: Lequita Asal, MD

## 2017-01-30 NOTE — Telephone Encounter (Signed)
  Please make appointment for patient.  M

## 2017-01-30 NOTE — Telephone Encounter (Signed)
-----   Message from Lequita Asal, MD sent at 01/30/2017  3:14 PM EDT ----- Regarding: Please call patient  Counts a little bit lower today than usual.  Any new medications or changes?  Let's recheck CBC with diff next week.  M  ----- Message ----- From: Interface, Lab In [place] Sent: 01/30/2017  10:54 AM To: Lequita Asal, MD

## 2017-02-06 ENCOUNTER — Inpatient Hospital Stay: Payer: Medicare HMO | Attending: Hematology and Oncology

## 2017-02-13 ENCOUNTER — Inpatient Hospital Stay: Payer: Medicare HMO

## 2017-02-13 ENCOUNTER — Ambulatory Visit: Payer: Medicare HMO | Admitting: Hematology and Oncology

## 2017-02-13 ENCOUNTER — Ambulatory Visit: Payer: Medicare HMO

## 2017-02-13 ENCOUNTER — Other Ambulatory Visit: Payer: Medicare HMO

## 2017-03-13 ENCOUNTER — Inpatient Hospital Stay: Payer: Medicare HMO

## 2017-03-13 ENCOUNTER — Inpatient Hospital Stay: Payer: Medicare HMO | Attending: Hematology and Oncology

## 2017-03-13 ENCOUNTER — Encounter: Payer: Self-pay | Admitting: Hematology and Oncology

## 2017-03-13 ENCOUNTER — Other Ambulatory Visit: Payer: Self-pay | Admitting: Hematology and Oncology

## 2017-03-13 ENCOUNTER — Inpatient Hospital Stay (HOSPITAL_BASED_OUTPATIENT_CLINIC_OR_DEPARTMENT_OTHER): Payer: Medicare HMO | Admitting: Hematology and Oncology

## 2017-03-13 VITALS — BP 170/68 | HR 46 | Temp 98.5°F | Resp 18 | Wt 191.4 lb

## 2017-03-13 DIAGNOSIS — R918 Other nonspecific abnormal finding of lung field: Secondary | ICD-10-CM | POA: Insufficient documentation

## 2017-03-13 DIAGNOSIS — E785 Hyperlipidemia, unspecified: Secondary | ICD-10-CM | POA: Insufficient documentation

## 2017-03-13 DIAGNOSIS — E538 Deficiency of other specified B group vitamins: Secondary | ICD-10-CM

## 2017-03-13 DIAGNOSIS — N2889 Other specified disorders of kidney and ureter: Secondary | ICD-10-CM

## 2017-03-13 DIAGNOSIS — D630 Anemia in neoplastic disease: Secondary | ICD-10-CM

## 2017-03-13 DIAGNOSIS — Z79899 Other long term (current) drug therapy: Secondary | ICD-10-CM | POA: Diagnosis not present

## 2017-03-13 DIAGNOSIS — C8300 Small cell B-cell lymphoma, unspecified site: Secondary | ICD-10-CM

## 2017-03-13 DIAGNOSIS — I10 Essential (primary) hypertension: Secondary | ICD-10-CM | POA: Diagnosis not present

## 2017-03-13 DIAGNOSIS — C8350 Lymphoblastic (diffuse) lymphoma, unspecified site: Secondary | ICD-10-CM | POA: Diagnosis not present

## 2017-03-13 DIAGNOSIS — Z8579 Personal history of other malignant neoplasms of lymphoid, hematopoietic and related tissues: Secondary | ICD-10-CM

## 2017-03-13 DIAGNOSIS — D696 Thrombocytopenia, unspecified: Secondary | ICD-10-CM

## 2017-03-13 LAB — CBC WITH DIFFERENTIAL/PLATELET
Basophils Absolute: 0.1 10*3/uL (ref 0–0.1)
Basophils Relative: 2 %
Eosinophils Absolute: 0.1 10*3/uL (ref 0–0.7)
Eosinophils Relative: 2 %
HCT: 40.5 % (ref 35.0–47.0)
Hemoglobin: 13.3 g/dL (ref 12.0–16.0)
Lymphocytes Relative: 33 %
Lymphs Abs: 1.2 10*3/uL (ref 1.0–3.6)
MCH: 26.5 pg (ref 26.0–34.0)
MCHC: 32.8 g/dL (ref 32.0–36.0)
MCV: 80.8 fL (ref 80.0–100.0)
Monocytes Absolute: 0.6 10*3/uL (ref 0.2–0.9)
Monocytes Relative: 16 %
Neutro Abs: 1.7 10*3/uL (ref 1.4–6.5)
Neutrophils Relative %: 47 %
Platelets: 80 10*3/uL — ABNORMAL LOW (ref 150–440)
RBC: 5.02 MIL/uL (ref 3.80–5.20)
RDW: 16 % — ABNORMAL HIGH (ref 11.5–14.5)
WBC: 3.6 10*3/uL (ref 3.6–11.0)

## 2017-03-13 LAB — COMPREHENSIVE METABOLIC PANEL
ALT: 15 U/L (ref 14–54)
AST: 22 U/L (ref 15–41)
Albumin: 3.2 g/dL — ABNORMAL LOW (ref 3.5–5.0)
Alkaline Phosphatase: 51 U/L (ref 38–126)
Anion gap: 5 (ref 5–15)
BUN: 24 mg/dL — ABNORMAL HIGH (ref 6–20)
CO2: 26 mmol/L (ref 22–32)
Calcium: 9 mg/dL (ref 8.9–10.3)
Chloride: 105 mmol/L (ref 101–111)
Creatinine, Ser: 1.27 mg/dL — ABNORMAL HIGH (ref 0.44–1.00)
GFR calc Af Amer: 43 mL/min — ABNORMAL LOW (ref >60)
GFR calc non Af Amer: 37 mL/min — ABNORMAL LOW (ref >60)
Glucose, Bld: 147 mg/dL — ABNORMAL HIGH (ref 65–99)
Potassium: 3.7 mmol/L (ref 3.5–5.1)
Sodium: 136 mmol/L (ref 135–145)
Total Bilirubin: 0.6 mg/dL (ref 0.3–1.2)
Total Protein: 7.7 g/dL (ref 6.5–8.1)

## 2017-03-13 LAB — FOLATE: Folate: 42 ng/mL

## 2017-03-13 MED ORDER — CYANOCOBALAMIN 1000 MCG/ML IJ SOLN
1000.0000 ug | Freq: Once | INTRAMUSCULAR | Status: AC
  Administered 2017-03-13: 1000 ug via INTRAMUSCULAR
  Filled 2017-03-13: qty 1

## 2017-03-13 NOTE — Progress Notes (Signed)
[facility] day:  03/13/17  Chief Complaint: Kristy Solomon is a 83 y.o. female with lymphoplasmacytic lymphoma who is seen for 3 month assessment on Ibrutinib.  HPI: The patient was last seen in the medical oncology clinic on 12/19/2016.  At that time, she feels good.  She denied any B symptoms.  Blood pressure remained elevated.  Exam was unremarkable.  M-spike was 1.5 gm/dL.  IgM was 2821.  CBC on 01/30/2017 revealed a hematocrit of 40.8, hemoglobin 13.2, MCV 81.2, platelets 74,000, white count 2700 with an ANC of 1300.  During the interim, she has done well.  She denies any fevers, sweats or weight loss.  She has had issues with her blood pressure.  She states that she is not eating salt.  Blood pressure medications have been changed recently.   Past Medical History:  Diagnosis Date  . 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 Hyperlipidemia   . Hypertension   . Malignant lymphoma, lymphoplasmacytic ([place]) 12/27/2013  . Renal insufficiency     Past Surgical History:  Procedure Laterality Date  . ABDOMINAL HYSTERECTOMY    . HEMORRHOID SURGERY      Family History  Problem Relation Age of Onset  . Cancer Brother        throat ca  . Cancer Brother        bone cancer    Social History:  reports that she has never smoked. She has never used smokeless tobacco. She reports that she does not drink alcohol or use drugs.  She lives in [place] with her grandson and his wife. Her emergency contact is Cecille Po, her grandaughter- [phone]).  [name]'s phone number [phone]).  She lives in [place].  She is alone today.  Allergies: No Known Allergies  Current Medications: Current Outpatient Prescriptions  Medication Sig Dispense Refill  . allopurinol (ZYLOPRIM) 100 MG tablet Take 1 tablet (100 mg total) by mouth daily. (Patient taking differently: Take 100 mg by mouth at bedtime. ) 30 tablet 0  . amLODipine-olmesartan  (AZOR) 5-40 MG tablet     . atorvastatin (LIPITOR) 20 MG tablet Take 20 mg by mouth daily.    . folic acid (FOLVITE) 1 MG tablet take 1 tablet by mouth once daily 30 tablet 1  . gabapentin (NEURONTIN) 300 MG capsule     . hydrALAZINE (APRESOLINE) 25 MG tablet     . ibuprofen (ADVIL,MOTRIN) 600 MG tablet Take 600 mg by mouth every 6 (six) hours as needed.     . metoprolol tartrate (LOPRESSOR) 25 MG tablet Take 25 mg by mouth 2 (two) times daily.  0  . omeprazole (PRILOSEC) 20 MG capsule Take 20 mg by mouth daily.  0  . allopurinol (ZYLOPRIM) 100 MG tablet take 2 tablets by mouth once daily (Patient not taking: Reported on 03/13/2017) 60 tablet 1  . chlorambucil (LEUKERAN) 2 MG tablet Take 3 tablets (6 mg total) by mouth daily. Give on an empty stomach 1 hour before or 2 hours after meals. (Patient not taking: Reported on 03/13/2017) 21 tablet 0  . colchicine 0.6 MG tablet Take 0.6 mg by mouth as needed.  0  . etodolac (LODINE) 200 MG capsule Take 1 capsule (200 mg total) by mouth 2 (two) times daily. (Patient not taking: Reported on 03/13/2017) 10 capsule 0  . ferrous sulfate 325 (65 FE) MG EC tablet Take 1 tablet by mouth a day with orange juice or vitamin C (Patient not taking: Reported  on 03/13/2017) 30 tablet 1  . IMBRUVICA 140 MG capsul Take 3 capsules by mouth daily. Reported on 04/18/2016    . lisinopril-hydrochlorothiazide (PRINZIDE,ZESTORETIC) 20-12.5 MG per tablet Take 1 tablet by mouth daily.    . polyethylene glycol (MIRALAX / GLYCOLAX) packet Take 17 g by mouth daily as needed for mild constipation. (Patient not taking: Reported on 03/13/2017) 14 each 1  . potassium chloride (K-DUR) 10 MEQ tablet take 1 tablet by mouth once daily (Patient not taking: Reported on 12/19/2016) 30 tablet 0   No current facility-administered medications for this visit.    Facility-Administered Medications Ordered in Other Visits  Medication Dose Route Frequency Provider Last Rate Last Dose  . cyanocobalamin  ((VITAMIN B-12)) injection 1,000 mcg  1,000 mcg Intramuscular Once Lequita Asal, MD        Review of Systems:  GENERAL: Feels "fine". No fevers or sweats. Weight up 7 pounds. PERFORMANCE STATUS (ECOG): 2 HEENT: No runny nose, sore throat, mouth sores or tenderness. Lungs:  No shortness of breath or cough.  No wheezing.  No hemoptysis. Cardiac: No chest pain, palpitations, orthopnea, or PND.  Blood pressure issues. GI: No nausea, vomiting, diarrhea, constipation, melena or hematochezia. GU: No urgency, frequency, dysuria or hematuria. Musculoskeletal: No back pain. No joint pain. No muscle tenderness. Extremities: No pain or swelling. Skin: No skin nodules. No rashes. Neuro: No headache, numbness or weakness, balance or coordination issues. Endocrine: No diabetes, thyroid issues, hot flashes or night sweats. Psych:No mood changes, depression or anxiety. Pain: No pain. Review of systems: All other systems reviewed and found to be negative.  Physical Exam: 184 Blood pressure (!) 170/68, pulse (!) 46, temperature 98.5 F (36.9 C), temperature source Tympanic, resp. rate 18, weight 191 lb 7 oz (86.8 kg). GENERAL: Elderly woman sitting comfortably in the exam room in clinic in no acute distress. MENTAL STATUS: Alert and oriented to person, place and time. HEAD: Wearing a straw hat.  Brown hair. Normocephalic, atraumatic, face symmetric, no Cushingoid features. EYES: Brown eyes. Pupils equal round and reactive to light and accomodation. No conjunctivitis or scleral icterus. ENT: Oropharynx clear without lesion. Tongue normal. Mucous membranes moist.  RESPIRATORY: Normal respiratory excursion. No rales, wheezes or rhonchi. CARDIOVASCULAR: Regular rate and rhythm without murmur, rub or gallop. ABDOMEN: Soft, non-tender with active bowel sounds and no appreciable hepatosplenomegaly. No guarding or rebound tenderness.  SKIN: No palpable nodules in  subcutaneous tissues.  EXTREMITIES: No edema, no skin discoloration or tenderness. No palpable cords. LYMPH NODES: No palpable cervical, supraclavicular, axillary or inguinal adenopathy  NEUROLOGICAL: Unremarkable. PSYCH: Appropriate   Appointment on 03/13/2017  Component Date Value Ref Range Status  . WBC 03/13/2017 3.6  3.6 - 11.0 K/uL Final  . RBC 03/13/2017 5.02  3.80 - 5.20 MIL/uL Final  . Hemoglobin 03/13/2017 13.3  12.0 - 16.0 g/dL Final  . HCT 03/13/2017 40.5  35.0 - 47.0 % Final  . MCV 03/13/2017 80.8  80.0 - 100.0 fL Final  . MCH 03/13/2017 26.5  26.0 - 34.0 pg Final  . MCHC 03/13/2017 32.8  32.0 - 36.0 g/dL Final  . RDW 03/13/2017 16.0* 11.5 - 14.5 % Final  . Platelets 03/13/2017 80* 150 - 440 K/uL Final  . Neutrophils Relative % 03/13/2017 47  % Final  . Neutro Abs 03/13/2017 1.7  1.4 - 6.5 K/uL Final  . Lymphocytes Relative 03/13/2017 33  % Final  . Lymphs Abs 03/13/2017 1.2  1.0 - 3.6 K/uL Final  . Monocytes Relative  03/13/2017 16  % Final  . Monocytes Absolute 03/13/2017 0.6  0.2 - 0.9 K/uL Final  . Eosinophils Relative 03/13/2017 2  % Final  . Eosinophils Absolute 03/13/2017 0.1  0 - 0.7 K/uL Final  . Basophils Relative 03/13/2017 2  % Final  . Basophils Absolute 03/13/2017 0.1  0 - 0.1 K/uL Final  . Sodium 03/13/2017 136  135 - 145 mmol/L Final  . Potassium 03/13/2017 3.7  3.5 - 5.1 mmol/L Final  . Chloride 03/13/2017 105  101 - 111 mmol/L Final  . CO2 03/13/2017 26  22 - 32 mmol/L Final  . Glucose, Bld 03/13/2017 147* 65 - 99 mg/dL Final  . BUN 03/13/2017 24* 6 - 20 mg/dL Final  . Creatinine, Ser 03/13/2017 1.27* 0.44 - 1.00 mg/dL Final  . Calcium 03/13/2017 9.0  8.9 - 10.3 mg/dL Final  . Total Protein 03/13/2017 7.7  6.5 - 8.1 g/dL Final  . Albumin 03/13/2017 3.2* 3.5 - 5.0 g/dL Final  . AST 03/13/2017 22  15 - 41 U/L Final  . ALT 03/13/2017 15  14 - 54 U/L Final  . Alkaline Phosphatase 03/13/2017 51  38 - 126 U/L Final  . Total Bilirubin 03/13/2017 0.6   0.3 - 1.2 mg/dL Final  . GFR calc non Af Amer 03/13/2017 37* >60 mL/min Final  . GFR calc Af Amer 03/13/2017 43* >60 mL/min Final   Comment: (NOTE) The eGFR has been calculated using the CKD EPI equation. This calculation has not been validated in all clinical situations. eGFR's persistently <60 mL/min signify possible Chronic Kidney Disease.   . Anion gap 03/13/2017 5  5 - 15 Final    Assessment:  CHELLE CAYTON is a 83 y.o. 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 She presented with massive subcutaneous deposits, pulmonary nodules, involvement of left kidney, and bowel. Disease was unresponsive to Rituxan in 2014.   Chest, abdomen, and pelvic CT scan on 03/17/2016 revealed progression of diffuse lymphoma with subcutaneous deposits throughout the subcutaneous fat of the chest, abdomen,and pelvis. There were multiple nodular infiltrates throughout the lungs, likely due to lymphoma but could also represent infection or atypical infection. There was massive retroperitoneal lymphadenopathy with diffuse retroperitoneal, mesenteric, and pelvic lymphadenopathy. There was wall thickening and pelvicsmall bowel probably representing lymphomas involvement. There was probable direct invasion of the left kidney.  Biopsy of the dominant SQ nodal conglomeration around the right flank on 03/18/2016 revealed a persistent low-grade CD5 positive B-cell lymphoma.  Flow cytometry was positive for CD20, CD79a, CD5, CD138. Neoplastic cells were positive for kappa and cyclin D1.  CD 23 was negative.   Additional molecular markers are pending.  Bone marrow aspirate and biopsy on 03/18/2016 revealed no evidence of lymphoma or plasma cell neoplasm (0.2% kappa monoclonal plasma cells by flow analysis).  Marrow was hypercellular for age (50-60%) with trilineage hematopoiesis and no increased blasts. There was mild to moderate widespread increase in reticulin fibers. There was  no storage iron detected.  The following studies were abnormal:  Beta2-microglobulin was 9.6 (0.6-2.4).  Serum viscosity was 5.5 (1.6-1.9).  Uric acid was 11.1 on 03/19/2016 and 5.7 on 05/23/2016.     Normal studies included:  hepatitis B surface antigen, hepatitis B core antibody total, hepatitis C antibody, HIV testing, and LDH (123).  G6PD assay was 9.2 (4.6-13.5).   She has iron deficiency (ferritin 27; iron saturation 11%), B12 deficiency (147), and folate deficiency (5.9).  She has chronic renal insufficiency (BUN was 30-35, creatinine was 1.38 -  1.80 with a GFR 29-39 ml/min).  She receives B12 (began 03/28/2016; last 01/16/2017).  She is on folic acid.  Folate was 77.5 on 05/09/2016.   She received 1 week of chlorambucil with a tapering dose of prednisone (began 03/30/2016).  Cortisol was 9.5 on 05/17/2016.  She began ibrutinib on 04/26/2016.  She has mild thrombocytopenia (94,000).   SPEP has been followed:  5.0 gm/dL on 03/18/2016, 4.5 on 04/25/2016,  2.3 on 06/20/2016, 2.0 on 10/24/2016, and 1.5 on 12/19/2016.  IgM has been followed:  > 5850 on 03/18/2016, > 5850 on 04/25/2016, 4652 on 06/09/2016, 3719 on 07/18/2016, 3429 on 10/24/2016, and 2821 on 12/19/2016.  Chest, abdomen, and pelvic CT scan on 04/21/2016 revealed a partial response to therapy.  There were bilateral pulmonary nodules/masses, measuring up to 5.6 cm in the right lower lobe (improved).  There was trace left pleural effusion.  There was a 12.7 cm left renal/perirenal mass (decreased) with associated mild left hydronephrosis.  Retroperitoneal/left pelvic lymphadenopathy was mildly decreased.  Multifocal subcutaneous nodules, measuring up to 4.5 cm in the left lower anterior abdominal wall were stable to mildly improved.  She denies any B symptoms.  Blood pressure remains elevated.  Exam is unremarkable.  Platelet count is 80,000.  Plan: 1.  Labs today:  CBC with diff, CMP, SPEP, IgM, folate. 2.  Continue ibrutinib 420 mg  a day. 3.  B12 today. 4.  RTC monthly x 2 for B12 5.  RTC in 6 weeks for labs (CBC with diff) 6.  RTC in 3 months for MD assessment, labs (CBC with diff, CMP, SPEP, IgM), and B12.   Lequita Asal, MD  03/13/2017, 11:01 AM

## 2017-03-13 NOTE — Progress Notes (Signed)
Patient states she is not taking all of her BP medications.  She does take amlodipine and metoprolol.  Takes metoprolol at night.  Took the amlodipine this morning.  No lisinopril/HCTZ.  BP elevated today.  170/68 HR 46

## 2017-03-14 LAB — PROTEIN ELECTROPHORESIS, SERUM
A/G Ratio: 0.7 (ref 0.7–1.7)
Albumin ELP: 3 g/dL (ref 2.9–4.4)
Alpha-1-Globulin: 0.3 g/dL (ref 0.0–0.4)
Alpha-2-Globulin: 0.7 g/dL (ref 0.4–1.0)
Beta Globulin: 0.9 g/dL (ref 0.7–1.3)
Gamma Globulin: 2.2 g/dL — ABNORMAL HIGH (ref 0.4–1.8)
Globulin, Total: 4.1 g/dL — ABNORMAL HIGH (ref 2.2–3.9)
M-Spike, %: 1.4 g/dL — ABNORMAL HIGH
Total Protein ELP: 7.1 g/dL (ref 6.0–8.5)

## 2017-03-14 LAB — IGM: IgM, Serum: 2295 mg/dL — ABNORMAL HIGH (ref 26–217)

## 2017-03-17 ENCOUNTER — Ambulatory Visit
Admission: RE | Admit: 2017-03-17 | Discharge: 2017-03-17 | Disposition: A | Discharge status: Home / Self Care | Payer: Medicare HMO | Source: Ambulatory Visit | Attending: Urology | Admitting: Urology

## 2017-03-17 ENCOUNTER — Ambulatory Visit: Payer: Medicare HMO | Admitting: Urology

## 2017-03-17 DIAGNOSIS — N200 Calculus of kidney: Secondary | ICD-10-CM | POA: Insufficient documentation

## 2017-03-20 ENCOUNTER — Ambulatory Visit: Payer: Medicare HMO | Admitting: Urology

## 2017-04-12 ENCOUNTER — Inpatient Hospital Stay: Payer: Medicare HMO | Attending: Hematology and Oncology

## 2017-04-24 ENCOUNTER — Inpatient Hospital Stay: Payer: Medicare HMO | Attending: Hematology and Oncology

## 2017-05-10 ENCOUNTER — Inpatient Hospital Stay: Payer: Medicare HMO | Attending: Hematology and Oncology

## 2017-05-10 DIAGNOSIS — E538 Deficiency of other specified B group vitamins: Secondary | ICD-10-CM | POA: Insufficient documentation

## 2017-05-10 DIAGNOSIS — Z79899 Other long term (current) drug therapy: Secondary | ICD-10-CM | POA: Insufficient documentation

## 2017-05-12 ENCOUNTER — Inpatient Hospital Stay: Payer: Medicare HMO

## 2017-05-12 DIAGNOSIS — Z79899 Other long term (current) drug therapy: Secondary | ICD-10-CM | POA: Diagnosis not present

## 2017-05-12 DIAGNOSIS — E538 Deficiency of other specified B group vitamins: Secondary | ICD-10-CM | POA: Diagnosis present

## 2017-05-12 MED ORDER — CYANOCOBALAMIN 1000 MCG/ML IJ SOLN
1000.0000 ug | Freq: Once | INTRAMUSCULAR | Status: AC
  Administered 2017-05-12: 1000 ug via INTRAMUSCULAR

## 2017-06-12 ENCOUNTER — Inpatient Hospital Stay: Payer: Medicare HMO

## 2017-06-12 ENCOUNTER — Inpatient Hospital Stay: Payer: Medicare HMO | Admitting: Hematology and Oncology

## 2017-06-12 NOTE — Progress Notes (Unsigned)
[facility] day:  06/12/17  Chief Complaint: Kristy Solomon is a 83 y.o. female with lymphoplasmacytic lymphoma who is seen for 3 month assessment on Ibrutinib.  HPI: The patient was last seen in the medical oncology clinic on 03/13/2017.  At that time, she was doing well.  She denied any B symptoms.  She had issues with her blood pressure.  CBC revealed a hematocrit of 40.5, hemoglobin 13.3, platelets 80,000, WBC 3600 with an ANC of 1700.  M-spike was 1400.  IgM was 2295 (improved).  Folate was 42.  She received B12.  She missed her B12 in July.  She did not have a follow-up CBC.  She last received B12 on 05/12/2017.  During the interim,   Past Medical History:  Diagnosis Date  . 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 Hyperlipidemia   . Hypertension   . Malignant lymphoma, lymphoplasmacytic ([place]) 12/27/2013  . Renal insufficiency     Past Surgical History:  Procedure Laterality Date  . ABDOMINAL HYSTERECTOMY    . HEMORRHOID SURGERY      Family History  Problem Relation Age of Onset  . Cancer Brother        throat ca  . Cancer Brother        bone cancer    Social History:  reports that she has never smoked. She has never used smokeless tobacco. She reports that she does not drink alcohol or use drugs.  She lives in [place] with her grandson and his wife. Her emergency contact is Cecille Po, her grandaughter- [phone]).  [name]'s phone number [phone]).  She lives in [place].  She is alone today.  Allergies: No Known Allergies  Current Medications: Current Outpatient Prescriptions  Medication Sig Dispense Refill  . allopurinol (ZYLOPRIM) 100 MG tablet take 2 tablets by mouth once daily (Patient not taking: Reported on 03/13/2017) 60 tablet 1  . allopurinol (ZYLOPRIM) 100 MG tablet Take 1 tablet (100 mg total) by mouth daily. (Patient taking differently: Take 100 mg by mouth at bedtime. ) 30 tablet 0  .  amLODipine-olmesartan (AZOR) 5-40 MG tablet     . atorvastatin (LIPITOR) 20 MG tablet Take 20 mg by mouth daily.    . chlorambucil (LEUKERAN) 2 MG tablet Take 3 tablets (6 mg total) by mouth daily. Give on an empty stomach 1 hour before or 2 hours after meals. (Patient not taking: Reported on 03/13/2017) 21 tablet 0  . colchicine 0.6 MG tablet Take 0.6 mg by mouth as needed.  0  . etodolac (LODINE) 200 MG capsule Take 1 capsule (200 mg total) by mouth 2 (two) times daily. (Patient not taking: Reported on 03/13/2017) 10 capsule 0  . ferrous sulfate 325 (65 FE) MG EC tablet Take 1 tablet by mouth a day with orange juice or vitamin C (Patient not taking: Reported on 03/13/2017) 30 tablet 1  . folic acid (FOLVITE) 1 MG tablet take 1 tablet by mouth once daily 30 tablet 1  . gabapentin (NEURONTIN) 300 MG capsule     . hydrALAZINE (APRESOLINE) 25 MG tablet     . ibuprofen (ADVIL,MOTRIN) 600 MG tablet Take 600 mg by mouth every 6 (six) hours as needed.     . IMBRUVICA 140 MG capsul Take 3 capsules by mouth daily. Reported on 04/18/2016    . lisinopril-hydrochlorothiazide (PRINZIDE,ZESTORETIC) 20-12.5 MG per tablet Take 1 tablet by mouth daily.    . metoprolol tartrate (LOPRESSOR) 25 MG tablet Take 25  mg by mouth 2 (two) times daily.  0  . omeprazole (PRILOSEC) 20 MG capsule Take 20 mg by mouth daily.  0  . polyethylene glycol (MIRALAX / GLYCOLAX) packet Take 17 g by mouth daily as needed for mild constipation. (Patient not taking: Reported on 03/13/2017) 14 each 1  . potassium chloride (K-DUR) 10 MEQ tablet take 1 tablet by mouth once daily (Patient not taking: Reported on 12/19/2016) 30 tablet 0   No current facility-administered medications for this visit.    Facility-Administered Medications Ordered in Other Visits  Medication Dose Route Frequency Provider Last Rate Last Dose  . cyanocobalamin ((VITAMIN B-12)) injection 1,000 mcg  1,000 mcg Intramuscular Once Lequita Asal, MD        Review of  Systems:  GENERAL: Feels "fine". No fevers or sweats. Weight up 7 pounds. PERFORMANCE STATUS (ECOG): 2 HEENT: No runny nose, sore throat, mouth sores or tenderness. Lungs:  No shortness of breath or cough.  No wheezing.  No hemoptysis. Cardiac: No chest pain, palpitations, orthopnea, or PND.  Blood pressure issues. GI: No nausea, vomiting, diarrhea, constipation, melena or hematochezia. GU: No urgency, frequency, dysuria or hematuria. Musculoskeletal: No back pain. No joint pain. No muscle tenderness. Extremities: No pain or swelling. Skin: No skin nodules. No rashes. Neuro: No headache, numbness or weakness, balance or coordination issues. Endocrine: No diabetes, thyroid issues, hot flashes or night sweats. Psych:No mood changes, depression or anxiety. Pain: No pain. Review of systems: All other systems reviewed and found to be negative.  Physical Exam: 184 There were no vitals taken for this visit. GENERAL: Elderly woman sitting comfortably in the exam room in clinic in no acute distress. MENTAL STATUS: Alert and oriented to person, place and time. HEAD: Wearing a straw hat.  Brown hair. Normocephalic, atraumatic, face symmetric, no Cushingoid features. EYES: Brown eyes. Pupils equal round and reactive to light and accomodation. No conjunctivitis or scleral icterus. ENT: Oropharynx clear without lesion. Tongue normal. Mucous membranes moist.  RESPIRATORY: Normal respiratory excursion. No rales, wheezes or rhonchi. CARDIOVASCULAR: Regular rate and rhythm without murmur, rub or gallop. ABDOMEN: Soft, non-tender with active bowel sounds and no appreciable hepatosplenomegaly. No guarding or rebound tenderness.  SKIN: No palpable nodules in subcutaneous tissues.  EXTREMITIES: No edema, no skin discoloration or tenderness. No palpable cords. LYMPH NODES: No palpable cervical, supraclavicular, axillary or inguinal adenopathy  NEUROLOGICAL:  Unremarkable. PSYCH: Appropriate   No visits with results within 3 Day(s) from this visit.  Latest known visit with results is:  Appointment on 03/13/2017  Component Date Value Ref Range Status  . WBC 03/13/2017 3.6  3.6 - 11.0 K/uL Final  . RBC 03/13/2017 5.02  3.80 - 5.20 MIL/uL Final  . Hemoglobin 03/13/2017 13.3  12.0 - 16.0 g/dL Final  . HCT 03/13/2017 40.5  35.0 - 47.0 % Final  . MCV 03/13/2017 80.8  80.0 - 100.0 fL Final  . MCH 03/13/2017 26.5  26.0 - 34.0 pg Final  . MCHC 03/13/2017 32.8  32.0 - 36.0 g/dL Final  . RDW 03/13/2017 16.0* 11.5 - 14.5 % Final  . Platelets 03/13/2017 80* 150 - 440 K/uL Final  . Neutrophils Relative % 03/13/2017 47  % Final  . Neutro Abs 03/13/2017 1.7  1.4 - 6.5 K/uL Final  . Lymphocytes Relative 03/13/2017 33  % Final  . Lymphs Abs 03/13/2017 1.2  1.0 - 3.6 K/uL Final  . Monocytes Relative 03/13/2017 16  % Final  . Monocytes Absolute 03/13/2017 0.6  0.2 -  0.9 K/uL Final  . Eosinophils Relative 03/13/2017 2  % Final  . Eosinophils Absolute 03/13/2017 0.1  0 - 0.7 K/uL Final  . Basophils Relative 03/13/2017 2  % Final  . Basophils Absolute 03/13/2017 0.1  0 - 0.1 K/uL Final  . Sodium 03/13/2017 136  135 - 145 mmol/L Final  . Potassium 03/13/2017 3.7  3.5 - 5.1 mmol/L Final  . Chloride 03/13/2017 105  101 - 111 mmol/L Final  . CO2 03/13/2017 26  22 - 32 mmol/L Final  . Glucose, Bld 03/13/2017 147* 65 - 99 mg/dL Final  . BUN 03/13/2017 24* 6 - 20 mg/dL Final  . Creatinine, Ser 03/13/2017 1.27* 0.44 - 1.00 mg/dL Final  . Calcium 03/13/2017 9.0  8.9 - 10.3 mg/dL Final  . Total Protein 03/13/2017 7.7  6.5 - 8.1 g/dL Final  . Albumin 03/13/2017 3.2* 3.5 - 5.0 g/dL Final  . AST 03/13/2017 22  15 - 41 U/L Final  . ALT 03/13/2017 15  14 - 54 U/L Final  . Alkaline Phosphatase 03/13/2017 51  38 - 126 U/L Final  . Total Bilirubin 03/13/2017 0.6  0.3 - 1.2 mg/dL Final  . GFR calc non Af Amer 03/13/2017 37* >60 mL/min Final  . GFR calc Af Amer  03/13/2017 43* >60 mL/min Final   Comment: (NOTE) The eGFR has been calculated using the CKD EPI equation. This calculation has not been validated in all clinical situations. eGFR's persistently <60 mL/min signify possible Chronic Kidney Disease.   . Anion gap 03/13/2017 5  5 - 15 Final  . Total Protein ELP 03/13/2017 7.1  6.0 - 8.5 g/dL Final  . Albumin ELP 03/13/2017 3.0  2.9 - 4.4 g/dL Final  . Alpha-1-Globulin 03/13/2017 0.3  0.0 - 0.4 g/dL Final  . Alpha-2-Globulin 03/13/2017 0.7  0.4 - 1.0 g/dL Final  . Beta Globulin 03/13/2017 0.9  0.7 - 1.3 g/dL Final  . Gamma Globulin 03/13/2017 2.2* 0.4 - 1.8 g/dL Final  . M-Spike, % 03/13/2017 1.4* Not Observed g/dL Final  . SPE Interp. 03/13/2017 Comment   Final   Comment: (NOTE) The SPE pattern demonstrates a single peak (M-spike) in the gamma region which may represent monoclonal protein. This peak may also be caused by circulating immune complexes, cryoglobulins, C-reactive protein, fibrinogen or hemolysis.  If clinically indicated, the presence of a monoclonal gammopathy may be confirmed by immuno- fixation, as well as an evaluation of the urine for the presence of Bence-Jones protein. Performed At: [facility] [place], [place] [id number] Lindon Romp MD BM:[id number]   . Comment 03/13/2017 Comment   Final   Comment: (NOTE) Protein electrophoresis scan will follow via computer, mail, or courier delivery.   Marland Kitchen GLOBULIN, TOTAL 03/13/2017 4.1* 2.2 - 3.9 g/dL Corrected  . A/G Ratio 03/13/2017 0.7  0.7 - 1.7 Corrected  . IgM, Serum 03/13/2017 2295* 26 - 217 mg/dL Final   Comment: (NOTE) Results confirmed on dilution. Performed At: [address] [facility] [place], [place] [id number] Lindon Romp MD SL:[id number]   . Folate 03/13/2017 42.0  >5.9 ng/mL Final    Assessment:  Kristy Solomon is a 83 y.o. 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 She  presented with massive subcutaneous deposits, pulmonary nodules, involvement of left kidney, and bowel. Disease was unresponsive to Rituxan in 2014.   Chest, abdomen, and pelvic CT scan on 03/17/2016 revealed progression of diffuse lymphoma with subcutaneous deposits throughout the subcutaneous fat of the chest, abdomen,and  pelvis. There were multiple nodular infiltrates throughout the lungs, likely due to lymphoma but could also represent infection or atypical infection. There was massive retroperitoneal lymphadenopathy with diffuse retroperitoneal, mesenteric, and pelvic lymphadenopathy. There was wall thickening and pelvicsmall bowel probably representing lymphomas involvement. There was probable direct invasion of the left kidney.  Biopsy of the dominant SQ nodal conglomeration around the right flank on 03/18/2016 revealed a persistent low-grade CD5 positive B-cell lymphoma.  Flow cytometry was positive for CD20, CD79a, CD5, CD138. Neoplastic cells were positive for kappa and cyclin D1.  CD 23 was negative.   Additional molecular markers are pending.  Bone marrow aspirate and biopsy on 03/18/2016 revealed no evidence of lymphoma or plasma cell neoplasm (0.2% kappa monoclonal plasma cells by flow analysis).  Marrow was hypercellular for age (50-60%) with trilineage hematopoiesis and no increased blasts. There was mild to moderate widespread increase in reticulin fibers. There was no storage iron detected.  The following studies were abnormal:  Beta2-microglobulin was 9.6 (0.6-2.4).  Serum viscosity was 5.5 (1.6-1.9).  Uric acid was 11.1 on 03/19/2016 and 5.7 on 05/23/2016.     Normal studies included:  hepatitis B surface antigen, hepatitis B core antibody total, hepatitis C antibody, HIV testing, and LDH (123).  G6PD assay was 9.2 (4.6-13.5).   She has iron deficiency (ferritin 27; iron saturation 11%), B12 deficiency (147), and folate deficiency (5.9).  She has chronic renal insufficiency  (BUN was 30-35, creatinine was 1.38 - 1.80 with a GFR 29-39 ml/min).  She receives B12 (began 03/28/2016; last 01/16/2017).  She is on folic acid.  Folate was 77.5 on 05/09/2016.   She received 1 week of chlorambucil with a tapering dose of prednisone (began 03/30/2016).  Cortisol was 9.5 on 05/17/2016.  She began ibrutinib on 04/26/2016.  She has mild thrombocytopenia (94,000).   SPEP has been followed:  5.0 gm/dL on 03/18/2016, 4.5 on 04/25/2016,  2.3 on 06/20/2016, 2.0 on 10/24/2016, and 1.5 on 12/19/2016.  IgM has been followed:  > 5850 on 03/18/2016, > 5850 on 04/25/2016, 4652 on 06/09/2016, 3719 on 07/18/2016, 3429 on 10/24/2016, and 2821 on 12/19/2016.  Chest, abdomen, and pelvic CT scan on 04/21/2016 revealed a partial response to therapy.  There were bilateral pulmonary nodules/masses, measuring up to 5.6 cm in the right lower lobe (improved).  There was trace left pleural effusion.  There was a 12.7 cm left renal/perirenal mass (decreased) with associated mild left hydronephrosis.  Retroperitoneal/left pelvic lymphadenopathy was mildly decreased.  Multifocal subcutaneous nodules, measuring up to 4.5 cm in the left lower anterior abdominal wall were stable to mildly improved.  She denies any B symptoms.  Blood pressure remains elevated.  Exam is unremarkable.  Platelet count is 80,000.  Plan: 1.  Labs today:  CBC with diff, CMP, SPEP, IgM, ferritin.  2.  Continue ibrutinib 420 mg a day. 3.  B12 today. 4.  RTC monthly x 2 for B12 5.  RTC in 6 weeks for labs (CBC with diff) 6.  RTC in 3 months for MD assessment, labs (CBC with diff, CMP, SPEP, IgM), and B12.   Lequita Asal, MD  06/12/2017, 8:46 AM

## 2017-06-29 ENCOUNTER — Other Ambulatory Visit: Payer: Self-pay

## 2017-06-29 DIAGNOSIS — C8300 Small cell B-cell lymphoma, unspecified site: Secondary | ICD-10-CM

## 2017-06-30 ENCOUNTER — Other Ambulatory Visit: Payer: Self-pay | Admitting: *Deleted

## 2017-06-30 ENCOUNTER — Encounter: Payer: Self-pay | Admitting: Hematology and Oncology

## 2017-06-30 ENCOUNTER — Inpatient Hospital Stay (HOSPITAL_BASED_OUTPATIENT_CLINIC_OR_DEPARTMENT_OTHER): Payer: Medicare HMO | Admitting: Hematology and Oncology

## 2017-06-30 ENCOUNTER — Inpatient Hospital Stay: Payer: Medicare HMO

## 2017-06-30 ENCOUNTER — Inpatient Hospital Stay: Payer: Medicare HMO | Attending: Hematology and Oncology

## 2017-06-30 VITALS — BP 127/84 | HR 56 | Temp 98.0°F | Resp 18 | Wt 196.2 lb

## 2017-06-30 DIAGNOSIS — C880 Waldenstrom macroglobulinemia: Secondary | ICD-10-CM | POA: Insufficient documentation

## 2017-06-30 DIAGNOSIS — I129 Hypertensive chronic kidney disease with stage 1 through stage 4 chronic kidney disease, or unspecified chronic kidney disease: Secondary | ICD-10-CM

## 2017-06-30 DIAGNOSIS — R590 Localized enlarged lymph nodes: Secondary | ICD-10-CM

## 2017-06-30 DIAGNOSIS — E785 Hyperlipidemia, unspecified: Secondary | ICD-10-CM

## 2017-06-30 DIAGNOSIS — D6481 Anemia due to antineoplastic chemotherapy: Secondary | ICD-10-CM | POA: Insufficient documentation

## 2017-06-30 DIAGNOSIS — Z79899 Other long term (current) drug therapy: Secondary | ICD-10-CM | POA: Diagnosis not present

## 2017-06-30 DIAGNOSIS — Z800 Family history of malignant neoplasm of digestive organs: Secondary | ICD-10-CM

## 2017-06-30 DIAGNOSIS — N189 Chronic kidney disease, unspecified: Secondary | ICD-10-CM | POA: Diagnosis not present

## 2017-06-30 DIAGNOSIS — C8591 Non-Hodgkin lymphoma, unspecified, lymph nodes of head, face, and neck: Secondary | ICD-10-CM

## 2017-06-30 DIAGNOSIS — D696 Thrombocytopenia, unspecified: Secondary | ICD-10-CM | POA: Diagnosis not present

## 2017-06-30 DIAGNOSIS — C8300 Small cell B-cell lymphoma, unspecified site: Secondary | ICD-10-CM | POA: Insufficient documentation

## 2017-06-30 DIAGNOSIS — E538 Deficiency of other specified B group vitamins: Secondary | ICD-10-CM | POA: Diagnosis not present

## 2017-06-30 DIAGNOSIS — Z808 Family history of malignant neoplasm of other organs or systems: Secondary | ICD-10-CM | POA: Diagnosis not present

## 2017-06-30 DIAGNOSIS — R918 Other nonspecific abnormal finding of lung field: Secondary | ICD-10-CM | POA: Diagnosis not present

## 2017-06-30 DIAGNOSIS — D500 Iron deficiency anemia secondary to blood loss (chronic): Secondary | ICD-10-CM

## 2017-06-30 DIAGNOSIS — D509 Iron deficiency anemia, unspecified: Secondary | ICD-10-CM

## 2017-06-30 LAB — COMPREHENSIVE METABOLIC PANEL
ALT: 21 U/L (ref 14–54)
AST: 20 U/L (ref 15–41)
Albumin: 3.3 g/dL — ABNORMAL LOW (ref 3.5–5.0)
Alkaline Phosphatase: 46 U/L (ref 38–126)
Anion gap: 6 (ref 5–15)
BUN: 23 mg/dL — ABNORMAL HIGH (ref 6–20)
CO2: 25 mmol/L (ref 22–32)
Calcium: 8.8 mg/dL — ABNORMAL LOW (ref 8.9–10.3)
Chloride: 106 mmol/L (ref 101–111)
Creatinine, Ser: 1.26 mg/dL — ABNORMAL HIGH (ref 0.44–1.00)
GFR calc Af Amer: 43 mL/min — ABNORMAL LOW (ref >60)
GFR calc non Af Amer: 37 mL/min — ABNORMAL LOW (ref >60)
Glucose, Bld: 105 mg/dL — ABNORMAL HIGH (ref 65–99)
Potassium: 3.9 mmol/L (ref 3.5–5.1)
Sodium: 137 mmol/L (ref 135–145)
Total Bilirubin: 0.6 mg/dL (ref 0.3–1.2)
Total Protein: 7.1 g/dL (ref 6.5–8.1)

## 2017-06-30 LAB — CBC WITH DIFFERENTIAL/PLATELET
Basophils Absolute: 0 10*3/uL (ref 0–0.1)
Basophils Relative: 1 %
Eosinophils Absolute: 0.1 10*3/uL (ref 0–0.7)
Eosinophils Relative: 2 %
HCT: 42.1 % (ref 35.0–47.0)
Hemoglobin: 13.9 g/dL (ref 12.0–16.0)
Lymphocytes Relative: 25 %
Lymphs Abs: 0.8 10*3/uL — ABNORMAL LOW (ref 1.0–3.6)
MCH: 26.1 pg (ref 26.0–34.0)
MCHC: 33 g/dL (ref 32.0–36.0)
MCV: 79 fL — ABNORMAL LOW (ref 80.0–100.0)
Monocytes Absolute: 0.5 10*3/uL (ref 0.2–0.9)
Monocytes Relative: 17 %
Neutro Abs: 1.8 10*3/uL (ref 1.4–6.5)
Neutrophils Relative %: 55 %
Platelets: 82 10*3/uL — ABNORMAL LOW (ref 150–440)
RBC: 5.32 MIL/uL — ABNORMAL HIGH (ref 3.80–5.20)
RDW: 16.6 % — ABNORMAL HIGH (ref 11.5–14.5)
WBC: 3.3 10*3/uL — ABNORMAL LOW (ref 3.6–11.0)

## 2017-06-30 LAB — FERRITIN: Ferritin: 24 ng/mL (ref 11–307)

## 2017-06-30 LAB — VITAMIN B12: Vitamin B-12: 695 pg/mL (ref 180–914)

## 2017-06-30 MED ORDER — CYANOCOBALAMIN 1000 MCG/ML IJ SOLN
1000.0000 ug | Freq: Once | INTRAMUSCULAR | Status: AC
  Administered 2017-06-30: 1000 ug via INTRAMUSCULAR
  Filled 2017-06-30: qty 1

## 2017-06-30 NOTE — Progress Notes (Signed)
Patient states she is having problems sleeping.  States she feels SOB when lying down.  Has stopped her Gabapentin and amlodipine.

## 2017-06-30 NOTE — Progress Notes (Signed)
[facility] day:  06/30/17  Chief Complaint: Kristy Solomon is a 83 y.o. female with lymphoplasmacytic lymphoma who is seen for 3 month assessment on Ibrutinib.  HPI: The patient was last seen in the medical oncology clinic on 03/13/2017.  At that time, she was doing well.  She denied any B symptoms.  She had issues with her blood pressure.  CBC revealed a hematocrit of 40.5, hemoglobin 13.3, platelets 80,000, WBC 3600 with an ANC of 1700.  M-spike was 1400.  IgM was 2295 (improved).  Folate was 42.  She received B12.  She missed her B12 in July.  She did not have a follow-up CBC.  She last received B12 on 05/12/2017.  During the interim, she has done well. She denies physical complaints. She denies any B symptoms today. Patient is eating well and has gained 5 pounds since last visit.  She continues on Ibrutinib with no perceived side effects.    Past Medical History:  Diagnosis Date  . 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 Hyperlipidemia   . Hypertension   . Malignant lymphoma, lymphoplasmacytic ([place]) 12/27/2013  . Renal insufficiency     Past Surgical History:  Procedure Laterality Date  . ABDOMINAL HYSTERECTOMY    . HEMORRHOID SURGERY      Family History  Problem Relation Age of Onset  . Cancer Brother        throat ca  . Cancer Brother        bone cancer    Social History:  reports that she has never smoked. She has never used smokeless tobacco. She reports that she does not drink alcohol or use drugs.  She lives in [place] with her grandson and his wife. Her emergency contact is Cecille Po, her grandaughter- [phone]).  [name]'s phone number [phone]).  She lives in [place].  She has transportation issues.  She is alone today.  Allergies: No Known Allergies  Current Medications: Current Outpatient Prescriptions  Medication Sig Dispense Refill  . allopurinol (ZYLOPRIM) 100 MG tablet Take 1 tablet (100 mg  total) by mouth daily. (Patient taking differently: Take 100 mg by mouth at bedtime. ) 30 tablet 0  . atorvastatin (LIPITOR) 20 MG tablet Take 20 mg by mouth daily.    . folic acid (FOLVITE) 1 MG tablet take 1 tablet by mouth once daily 30 tablet 1  . hydrALAZINE (APRESOLINE) 25 MG tablet     . ibuprofen (ADVIL,MOTRIN) 600 MG tablet Take 600 mg by mouth every 6 (six) hours as needed.     . IMBRUVICA 140 MG capsul Take 3 capsules by mouth daily. Reported on 04/18/2016    . lisinopril-hydrochlorothiazide (PRINZIDE,ZESTORETIC) 20-12.5 MG per tablet Take 1 tablet by mouth daily.    . metoprolol tartrate (LOPRESSOR) 25 MG tablet Take 25 mg by mouth 2 (two) times daily.  0  . omeprazole (PRILOSEC) 20 MG capsule Take 20 mg by mouth daily.  0  . polyethylene glycol (MIRALAX / GLYCOLAX) packet Take 17 g by mouth daily as needed for mild constipation. 14 each 1  . allopurinol (ZYLOPRIM) 100 MG tablet take 2 tablets by mouth once daily (Patient not taking: Reported on 03/13/2017) 60 tablet 1  . amLODipine-olmesartan (AZOR) 5-40 MG tablet     . chlorambucil (LEUKERAN) 2 MG tablet Take 3 tablets (6 mg total) by mouth daily. Give on an empty stomach 1 hour before or 2 hours after meals. (Patient not taking: Reported on 03/13/2017)  21 tablet 0  . colchicine 0.6 MG tablet Take 0.6 mg by mouth as needed.  0  . etodolac (LODINE) 200 MG capsule Take 1 capsule (200 mg total) by mouth 2 (two) times daily. (Patient not taking: Reported on 03/13/2017) 10 capsule 0  . ferrous sulfate 325 (65 FE) MG EC tablet Take 1 tablet by mouth a day with orange juice or vitamin C (Patient not taking: Reported on 03/13/2017) 30 tablet 1  . gabapentin (NEURONTIN) 300 MG capsule     . potassium chloride (K-DUR) 10 MEQ tablet take 1 tablet by mouth once daily (Patient not taking: Reported on 12/19/2016) 30 tablet 0   No current facility-administered medications for this visit.    Facility-Administered Medications Ordered in Other Visits   Medication Dose Route Frequency Provider Last Rate Last Dose  . cyanocobalamin ((VITAMIN B-12)) injection 1,000 mcg  1,000 mcg Intramuscular Once Lequita Asal, MD        Review of Systems:  GENERAL: Feels "fine". No problems.  No fevers or sweats. Weight up 5 pounds. PERFORMANCE STATUS (ECOG): 2 HEENT: No runny nose, sore throat, mouth sores or tenderness. Lungs:  No shortness of breath or cough.  No wheezing.  No hemoptysis. Cardiac: No chest pain, palpitations, orthopnea, or PND.  Blood pressure issues. GI: No nausea, vomiting, diarrhea, constipation, melena or hematochezia. GU: No urgency, frequency, dysuria or hematuria. Musculoskeletal: No back pain. No joint pain. No muscle tenderness. Extremities: No pain or swelling. Skin: No skin nodules. No rashes. Neuro: No headache, numbness or weakness, balance or coordination issues. Endocrine: No diabetes, thyroid issues, hot flashes or night sweats. Psych:No mood changes, depression or anxiety. Pain: No pain. Review of systems: All other systems reviewed and found to be negative.  Physical Exam:  Blood pressure 127/84, pulse (!) 56, temperature 98 F (36.7 C), temperature source Tympanic, resp. rate 18, weight 196 lb 4 oz (89 kg). GENERAL: Elderly woman sitting comfortably in the exam room in clinic in no acute distress. MENTAL STATUS: Alert and oriented to person, place and time. HEAD: Curly black wig with white highlights. Normocephalic, atraumatic, face symmetric, no Cushingoid features. EYES: Brown eyes. Pupils equal round and reactive to light and accomodation. No conjunctivitis or scleral icterus. ENT: Oropharynx clear without lesion. Tongue normal. Mucous membranes moist.  RESPIRATORY: Normal respiratory excursion. No rales, wheezes or rhonchi. CARDIOVASCULAR: Regular rate and rhythm without murmur, rub or gallop. ABDOMEN: Soft, non-tender with active bowel sounds and no appreciable  hepatosplenomegaly. No guarding or rebound tenderness.  SKIN: No palpable nodules in subcutaneous tissues.  EXTREMITIES: No edema, no skin discoloration or tenderness. No palpable cords. LYMPH NODES: No palpable cervical, supraclavicular, axillary or inguinal adenopathy  NEUROLOGICAL: Unremarkable. PSYCH: Appropriate   Appointment on 06/30/2017  Component Date Value Ref Range Status  . WBC 06/30/2017 3.3* 3.6 - 11.0 K/uL Final  . RBC 06/30/2017 5.32* 3.80 - 5.20 MIL/uL Final  . Hemoglobin 06/30/2017 13.9  12.0 - 16.0 g/dL Final  . HCT 06/30/2017 42.1  35.0 - 47.0 % Final  . MCV 06/30/2017 79.0* 80.0 - 100.0 fL Final  . MCH 06/30/2017 26.1  26.0 - 34.0 pg Final  . MCHC 06/30/2017 33.0  32.0 - 36.0 g/dL Final  . RDW 06/30/2017 16.6* 11.5 - 14.5 % Final  . Platelets 06/30/2017 82* 150 - 440 K/uL Final  . Neutrophils Relative % 06/30/2017 55  % Final  . Neutro Abs 06/30/2017 1.8  1.4 - 6.5 K/uL Final  . Lymphocytes Relative 06/30/2017  25  % Final  . Lymphs Abs 06/30/2017 0.8* 1.0 - 3.6 K/uL Final  . Monocytes Relative 06/30/2017 17  % Final  . Monocytes Absolute 06/30/2017 0.5  0.2 - 0.9 K/uL Final  . Eosinophils Relative 06/30/2017 2  % Final  . Eosinophils Absolute 06/30/2017 0.1  0 - 0.7 K/uL Final  . Basophils Relative 06/30/2017 1  % Final  . Basophils Absolute 06/30/2017 0.0  0 - 0.1 K/uL Final  . Sodium 06/30/2017 137  135 - 145 mmol/L Final  . Potassium 06/30/2017 3.9  3.5 - 5.1 mmol/L Final  . Chloride 06/30/2017 106  101 - 111 mmol/L Final  . CO2 06/30/2017 25  22 - 32 mmol/L Final  . Glucose, Bld 06/30/2017 105* 65 - 99 mg/dL Final  . BUN 06/30/2017 23* 6 - 20 mg/dL Final  . Creatinine, Ser 06/30/2017 1.26* 0.44 - 1.00 mg/dL Final  . Calcium 06/30/2017 8.8* 8.9 - 10.3 mg/dL Final  . Total Protein 06/30/2017 7.1  6.5 - 8.1 g/dL Final  . Albumin 06/30/2017 3.3* 3.5 - 5.0 g/dL Final  . AST 06/30/2017 20  15 - 41 U/L Final  . ALT 06/30/2017 21  14 - 54 U/L Final  .  Alkaline Phosphatase 06/30/2017 46  38 - 126 U/L Final  . Total Bilirubin 06/30/2017 0.6  0.3 - 1.2 mg/dL Final  . GFR calc non Af Amer 06/30/2017 37* >60 mL/min Final  . GFR calc Af Amer 06/30/2017 43* >60 mL/min Final   Comment: (NOTE) The eGFR has been calculated using the CKD EPI equation. This calculation has not been validated in all clinical situations. eGFR's persistently <60 mL/min signify possible Chronic Kidney Disease.   . Anion gap 06/30/2017 6  5 - 15 Final    Assessment:  Kristy Solomon is a 83 y.o. 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 She presented with massive subcutaneous deposits, pulmonary nodules, involvement of left kidney, and bowel. Disease was unresponsive to Rituxan in 2014.   Chest, abdomen, and pelvic CT scan on 03/17/2016 revealed progression of diffuse lymphoma with subcutaneous deposits throughout the subcutaneous fat of the chest, abdomen,and pelvis. There were multiple nodular infiltrates throughout the lungs, likely due to lymphoma but could also represent infection or atypical infection. There was massive retroperitoneal lymphadenopathy with diffuse retroperitoneal, mesenteric, and pelvic lymphadenopathy. There was wall thickening and pelvicsmall bowel probably representing lymphomas involvement. There was probable direct invasion of the left kidney.  Biopsy of the dominant SQ nodal conglomeration around the right flank on 03/18/2016 revealed a persistent low-grade CD5 positive B-cell lymphoma.  Flow cytometry was positive for CD20, CD79a, CD5, CD138. Neoplastic cells were positive for kappa and cyclin D1.  CD 23 was negative.   Additional molecular markers are pending.  Bone marrow aspirate and biopsy on 03/18/2016 revealed no evidence of lymphoma or plasma cell neoplasm (0.2% kappa monoclonal plasma cells by flow analysis).  Marrow was hypercellular for age (50-60%) with trilineage hematopoiesis and no  increased blasts. There was mild to moderate widespread increase in reticulin fibers. There was no storage iron detected.  The following studies were abnormal:  Beta2-microglobulin was 9.6 (0.6-2.4).  Serum viscosity was 5.5 (1.6-1.9).  Uric acid was 11.1 on 03/19/2016 and 5.7 on 05/23/2016.     Normal studies included:  hepatitis B surface antigen, hepatitis B core antibody total, hepatitis C antibody, HIV testing, and LDH (123).  G6PD assay was 9.2 (4.6-13.5).   She has iron deficiency (ferritin 27; iron saturation 11%), B12  deficiency (147), and folate deficiency (5.9).  She has chronic renal insufficiency (BUN was 30-35, creatinine was 1.38 - 1.80 with a GFR 29-39 ml/min).  She receives B12 (began 03/28/2016; last 05/12/2017).  She is on folic acid.  Folate was 77.5 on 05/09/2016 and 42 on 03/13/2017.   She received 1 week of chlorambucil with a tapering dose of prednisone (began 03/30/2016).  Cortisol was 9.5 on 05/17/2016.  She began ibrutinib on 04/26/2016.  She has mild thrombocytopenia (94,000).   SPEP has been followed:  5.0 gm/dL on 03/18/2016, 4.5 on 04/25/2016,  2.3 on 06/20/2016, 2.0 on 10/24/2016, and 1.5 on 12/19/2016.  IgM has been followed:  > 5850 on 03/18/2016, > 5850 on 04/25/2016, 4652 on 06/09/2016, 3719 on 07/18/2016, 3429 on 10/24/2016, and 2821 on 12/19/2016.  Chest, abdomen, and pelvic CT scan on 04/21/2016 revealed a partial response to therapy.  There were bilateral pulmonary nodules/masses, measuring up to 5.6 cm in the right lower lobe (improved).  There was trace left pleural effusion.  There was a 12.7 cm left renal/perirenal mass (decreased) with associated mild left hydronephrosis.  Retroperitoneal/left pelvic lymphadenopathy was mildly decreased.  Multifocal subcutaneous nodules, measuring up to 4.5 cm in the left lower anterior abdominal wall were stable to mildly improved.  She denies any B symptoms. Exam is unremarkable.  WBC 3300 with ANC of 1800. Hemoglobin is  13.9, hematocrit 42.1, and platelets 82,000.  Plan: 1.  Labs today:  CBC with diff, CMP, SPEP, IgM, ferritin. 2.  Continue ibrutinib 420 mg a day. 3.  B12 injections today. 4.  RTC monthly x 2 for B12 injections 5.  RTC in 4 weeks for labs (CBC with diff); schedule on same day as B12 injection. 6.  RTC in 3 months for MD assessment, labs (CBC with diff, CMP, SPEP, IgM), and B12. 7.  Arrange for Lucianne Lei to transport patient. She cites that her missed appointments have been because she has transportation issues.    Honor Loh, NP  06/30/2017, 9:23 AM   I saw and evaluated the patient, participating in the key portions of the service and reviewing pertinent diagnostic studies and records.  I reviewed the nurse practitioner's note and agree with the findings and the plan.  The assessment and plan were discussed with the patient.  A few questions were asked by the patient and answered.   Lequita Asal, MD 06/30/2017, 9:23 AM

## 2017-07-01 LAB — IGM: IgM (Immunoglobulin M), Srm: 2500 mg/dL — ABNORMAL HIGH (ref 26–217)

## 2017-07-03 LAB — PROTEIN ELECTROPHORESIS, SERUM
A/G Ratio: 0.8 (ref 0.7–1.7)
Albumin ELP: 3 g/dL (ref 2.9–4.4)
Alpha-1-Globulin: 0.2 g/dL (ref 0.0–0.4)
Alpha-2-Globulin: 0.7 g/dL (ref 0.4–1.0)
Beta Globulin: 1 g/dL (ref 0.7–1.3)
Gamma Globulin: 2 g/dL — ABNORMAL HIGH (ref 0.4–1.8)
Globulin, Total: 3.9 g/dL (ref 2.2–3.9)
M-Spike, %: 1.1 g/dL — ABNORMAL HIGH
Total Protein ELP: 6.9 g/dL (ref 6.0–8.5)

## 2017-07-09 ENCOUNTER — Other Ambulatory Visit: Payer: Self-pay | Admitting: Hematology and Oncology

## 2017-07-10 ENCOUNTER — Telehealth: Payer: Self-pay | Admitting: Pharmacist

## 2017-07-10 DIAGNOSIS — C8300 Small cell B-cell lymphoma, unspecified site: Secondary | ICD-10-CM

## 2017-07-10 DIAGNOSIS — E790 Hyperuricemia without signs of inflammatory arthritis and tophaceous disease: Secondary | ICD-10-CM

## 2017-07-10 DIAGNOSIS — E538 Deficiency of other specified B group vitamins: Secondary | ICD-10-CM

## 2017-07-10 MED ORDER — IBRUTINIB 420 MG PO TABS: 420.0000 mg | ORAL_TABLET | Freq: Every day | ORAL | 2 refills | Status: DC

## 2017-07-10 MED FILL — IMBRUVICA 420 MG TAB: 420 | 28 days supply | Qty: 28 | Fill #0

## 2017-07-10 NOTE — Telephone Encounter (Signed)
Oral Oncology Patient Advocate Encounter   Received prescription refill from [facility] today for [name]'s Imbruvica. Called patient to see if we could fill at our pharmacy and she was fine with that. Her co-pay is $0.00.  Sent e-mail to [facility] to ask them to mail to her. Verified her address.  Called [facility] to let them know she is filling at another pharmacy.    [place] Patient Advocate [phone] 07/10/2017 3:05 PM

## 2017-07-10 NOTE — Telephone Encounter (Signed)
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 Medication started 04/26/2016.  CBC from 06/30/17 assessed, no relevant lab abnormalities. Prescription dose and frequency assessed.   Current medication list in Epic reviewed, no relevant DDIs with ibrutinib identified.  Prescription has been e-scribed to the [facility] and we will see if the insurance company will let it be filled there.  Oral Oncology Clinic will continue to follow patient.   Darl Pikes, PharmD, BCPS Hematology/Oncology Clinical Pharmacist [facility] [phone]  07/10/2017 11:07 AM

## 2017-07-10 NOTE — Telephone Encounter (Signed)
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 Medication started 04/26/2016.  Pt was previously filling at an outside pharmacy but the medication is now being filled at [facility]  Pt is doing well.  Reviewed administration, dosing, side effects, monitoring, drug-food interactions, safe handling, storage, and disposal. Patient will take 420 mg by mouth daily.  Side effects include but not limited to: rash, N/V/D, fatigue, peripheral edema    Reviewed with patient importance of keeping a medication schedule and plan for any missed doses.  Ms.Herringshaw voiced understanding and appreciation. All questions answered.  Patient knows to call the office with questions or concerns. Oral Oncology Clinic will continue to follow.  Thank you,  Darl Pikes, PharmD, BCPS Hematology/Oncology Clinical Pharmacist [facility] [phone]  07/10/2017 2:58 PM

## 2017-07-31 ENCOUNTER — Inpatient Hospital Stay: Payer: Medicare HMO

## 2017-07-31 ENCOUNTER — Inpatient Hospital Stay: Payer: Medicare HMO | Attending: Hematology and Oncology

## 2017-07-31 DIAGNOSIS — E538 Deficiency of other specified B group vitamins: Secondary | ICD-10-CM | POA: Diagnosis present

## 2017-07-31 DIAGNOSIS — Z79899 Other long term (current) drug therapy: Secondary | ICD-10-CM | POA: Diagnosis not present

## 2017-07-31 DIAGNOSIS — C8591 Non-Hodgkin lymphoma, unspecified, lymph nodes of head, face, and neck: Secondary | ICD-10-CM

## 2017-07-31 LAB — CBC WITH DIFFERENTIAL/PLATELET
Basophils Absolute: 0 10*3/uL (ref 0–0.1)
Basophils Relative: 1 %
Eosinophils Absolute: 0.1 10*3/uL (ref 0–0.7)
Eosinophils Relative: 2 %
HCT: 42.1 % (ref 35.0–47.0)
Hemoglobin: 13.6 g/dL (ref 12.0–16.0)
Lymphocytes Relative: 32 %
Lymphs Abs: 1 10*3/uL (ref 1.0–3.6)
MCH: 25.9 pg — ABNORMAL LOW (ref 26.0–34.0)
MCHC: 32.2 g/dL (ref 32.0–36.0)
MCV: 80.2 fL (ref 80.0–100.0)
Monocytes Absolute: 0.5 10*3/uL (ref 0.2–0.9)
Monocytes Relative: 16 %
Neutro Abs: 1.5 10*3/uL (ref 1.4–6.5)
Neutrophils Relative %: 49 %
Platelets: 87 10*3/uL — ABNORMAL LOW (ref 150–440)
RBC: 5.25 MIL/uL — ABNORMAL HIGH (ref 3.80–5.20)
RDW: 16.2 % — ABNORMAL HIGH (ref 11.5–14.5)
WBC: 3.1 10*3/uL — ABNORMAL LOW (ref 3.6–11.0)

## 2017-07-31 MED ORDER — CYANOCOBALAMIN 1000 MCG/ML IJ SOLN
1000.0000 ug | Freq: Once | INTRAMUSCULAR | Status: AC
  Administered 2017-07-31: 1000 ug via INTRAMUSCULAR

## 2017-08-15 MED FILL — IMBRUVICA 420 MG TAB: 420 | 28 days supply | Qty: 28 | Fill #1

## 2017-08-28 ENCOUNTER — Inpatient Hospital Stay: Payer: Medicare HMO | Attending: Hematology and Oncology

## 2017-08-28 ENCOUNTER — Inpatient Hospital Stay: Payer: Medicare HMO

## 2017-08-28 DIAGNOSIS — E538 Deficiency of other specified B group vitamins: Secondary | ICD-10-CM

## 2017-08-28 DIAGNOSIS — Z79899 Other long term (current) drug therapy: Secondary | ICD-10-CM | POA: Diagnosis not present

## 2017-08-28 MED ORDER — CYANOCOBALAMIN 1000 MCG/ML IJ SOLN
1000.0000 ug | Freq: Once | INTRAMUSCULAR | Status: AC
  Administered 2017-08-28: 1000 ug via INTRAMUSCULAR

## 2017-09-11 MED FILL — IMBRUVICA 420 MG TAB: 420 | 28 days supply | Qty: 28 | Fill #2

## 2017-09-29 ENCOUNTER — Ambulatory Visit: Payer: Medicare HMO | Admitting: Hematology and Oncology

## 2017-09-29 ENCOUNTER — Other Ambulatory Visit: Payer: Self-pay | Admitting: Hematology and Oncology

## 2017-09-29 ENCOUNTER — Inpatient Hospital Stay: Payer: Medicare HMO

## 2017-09-29 ENCOUNTER — Other Ambulatory Visit: Payer: Self-pay

## 2017-09-29 ENCOUNTER — Inpatient Hospital Stay: Payer: Medicare HMO | Attending: Hematology and Oncology | Admitting: Hematology and Oncology

## 2017-09-29 ENCOUNTER — Ambulatory Visit: Payer: Medicare HMO

## 2017-09-29 ENCOUNTER — Encounter: Payer: Self-pay | Admitting: Hematology and Oncology

## 2017-09-29 VITALS — BP 169/70 | HR 59 | Temp 98.5°F | Wt 205.0 lb

## 2017-09-29 DIAGNOSIS — C8300 Small cell B-cell lymphoma, unspecified site: Secondary | ICD-10-CM | POA: Insufficient documentation

## 2017-09-29 DIAGNOSIS — E785 Hyperlipidemia, unspecified: Secondary | ICD-10-CM | POA: Diagnosis not present

## 2017-09-29 DIAGNOSIS — E538 Deficiency of other specified B group vitamins: Secondary | ICD-10-CM | POA: Diagnosis not present

## 2017-09-29 DIAGNOSIS — C8591 Non-Hodgkin lymphoma, unspecified, lymph nodes of head, face, and neck: Secondary | ICD-10-CM

## 2017-09-29 DIAGNOSIS — Z7982 Long term (current) use of aspirin: Secondary | ICD-10-CM | POA: Insufficient documentation

## 2017-09-29 DIAGNOSIS — I10 Essential (primary) hypertension: Secondary | ICD-10-CM | POA: Diagnosis not present

## 2017-09-29 DIAGNOSIS — Z808 Family history of malignant neoplasm of other organs or systems: Secondary | ICD-10-CM | POA: Insufficient documentation

## 2017-09-29 DIAGNOSIS — D696 Thrombocytopenia, unspecified: Secondary | ICD-10-CM | POA: Diagnosis not present

## 2017-09-29 DIAGNOSIS — N2889 Other specified disorders of kidney and ureter: Secondary | ICD-10-CM

## 2017-09-29 DIAGNOSIS — D6481 Anemia due to antineoplastic chemotherapy: Secondary | ICD-10-CM

## 2017-09-29 DIAGNOSIS — D500 Iron deficiency anemia secondary to blood loss (chronic): Secondary | ICD-10-CM

## 2017-09-29 DIAGNOSIS — Z79899 Other long term (current) drug therapy: Secondary | ICD-10-CM | POA: Diagnosis not present

## 2017-09-29 LAB — CBC WITH DIFFERENTIAL/PLATELET
Basophils Absolute: 0 10*3/uL (ref 0–0.1)
Basophils Relative: 1 %
Eosinophils Absolute: 0.1 10*3/uL (ref 0–0.7)
Eosinophils Relative: 2 %
HCT: 44.2 % (ref 35.0–47.0)
Hemoglobin: 14.2 g/dL (ref 12.0–16.0)
Lymphocytes Relative: 28 %
Lymphs Abs: 1.1 10*3/uL (ref 1.0–3.6)
MCH: 26 pg (ref 26.0–34.0)
MCHC: 32.1 g/dL (ref 32.0–36.0)
MCV: 81 fL (ref 80.0–100.0)
Monocytes Absolute: 0.6 10*3/uL (ref 0.2–0.9)
Monocytes Relative: 15 %
Neutro Abs: 2.2 10*3/uL (ref 1.4–6.5)
Neutrophils Relative %: 54 %
Platelets: 84 10*3/uL — ABNORMAL LOW (ref 150–440)
RBC: 5.46 MIL/uL — ABNORMAL HIGH (ref 3.80–5.20)
RDW: 16.2 % — ABNORMAL HIGH (ref 11.5–14.5)
WBC: 4 10*3/uL (ref 3.6–11.0)

## 2017-09-29 LAB — COMPREHENSIVE METABOLIC PANEL
ALT: 15 U/L (ref 14–54)
AST: 19 U/L (ref 15–41)
Albumin: 3.3 g/dL — ABNORMAL LOW (ref 3.5–5.0)
Alkaline Phosphatase: 45 U/L (ref 38–126)
Anion gap: 8 (ref 5–15)
BUN: 22 mg/dL — ABNORMAL HIGH (ref 6–20)
CO2: 27 mmol/L (ref 22–32)
Calcium: 8.7 mg/dL — ABNORMAL LOW (ref 8.9–10.3)
Chloride: 104 mmol/L (ref 101–111)
Creatinine, Ser: 1.28 mg/dL — ABNORMAL HIGH (ref 0.44–1.00)
GFR calc Af Amer: 43 mL/min — ABNORMAL LOW (ref >60)
GFR calc non Af Amer: 37 mL/min — ABNORMAL LOW (ref >60)
Glucose, Bld: 102 mg/dL — ABNORMAL HIGH (ref 65–99)
Potassium: 4.2 mmol/L (ref 3.5–5.1)
Sodium: 139 mmol/L (ref 135–145)
Total Bilirubin: 0.8 mg/dL (ref 0.3–1.2)
Total Protein: 7.4 g/dL (ref 6.5–8.1)

## 2017-09-29 LAB — IRON AND TIBC
Iron: 51 ug/dL (ref 28–170)
Saturation Ratios: 15 % (ref 10.4–31.8)
TIBC: 335 ug/dL (ref 250–450)
UIBC: 284 ug/dL

## 2017-09-29 LAB — FERRITIN: Ferritin: 29 ng/mL (ref 11–307)

## 2017-09-29 MED ORDER — CYANOCOBALAMIN 1000 MCG/ML IJ SOLN
1000.0000 ug | Freq: Once | INTRAMUSCULAR | Status: AC
  Administered 2017-09-29: 1000 ug via INTRAMUSCULAR
  Filled 2017-09-29: qty 1

## 2017-09-29 NOTE — Progress Notes (Signed)
[facility] day:  09/29/17  Chief Complaint: Kristy Solomon is a 83 y.o. female with lymphoplasmacytic lymphoma who is seen for 3 month assessment on Ibrutinib.  HPI: The patient was last seen in the medical oncology clinic on 06/30/2017.  At that time, she was doing well.  She denied any B symptoms.  WBC was 3300 with ANC of 1800. Hemoglobin was 13.9, hematocrit 42.1, and platelets 82,000.  Ferritin was 24.  M-spike was 1.1 gm/dl.    She has received B12 monthly (06/30/2017 - 08/28/2017).  During the interim, patient has done well. She denies any physical concerns. Patient is getting ready to go to a Christmas party. Patient denies any B symptoms or interval infections. Patient is eating well. She has gained 9 pounds since her last visit.   She continues on her Ibrutinib as prescribed.    Past Medical History:  Diagnosis Date  . 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 Hyperlipidemia   . Hypertension   . Malignant lymphoma, lymphoplasmacytic ([place]) 12/27/2013  . Renal insufficiency     Past Surgical History:  Procedure Laterality Date  . ABDOMINAL HYSTERECTOMY    . HEMORRHOID SURGERY      Family History  Problem Relation Age of Onset  . Cancer Brother        throat ca  . Cancer Brother        bone cancer    Social History:  reports that  has never smoked. she has never used smokeless tobacco. She reports that she does not drink alcohol or use drugs.  She lives in [place] with her grandson and his wife. Her emergency contact is Cecille Po, her grandaughter- [phone]).  [name]'s phone number [phone]).  She lives in [place].  She has transportation issues.  She is alone today.  Allergies: No Known Allergies  Current Medications: Current Outpatient Medications  Medication Sig Dispense Refill  . allopurinol (ZYLOPRIM) 100 MG tablet take 2 tablets by mouth once daily 60 tablet 1  . amLODipine-olmesartan (AZOR)  5-40 MG tablet     . aspirin EC 81 MG tablet Take 81 mg by mouth daily.    Marland Kitchen atorvastatin (LIPITOR) 20 MG tablet Take 20 mg by mouth daily.    . Calcium Carbonate-Vitamin D 600-400 MG-UNIT tablet Take by mouth.    . chlorambucil (LEUKERAN) 2 MG tablet Take 3 tablets (6 mg total) by mouth daily. Give on an empty stomach 1 hour before or 2 hours after meals. 21 tablet 0  . colchicine 0.6 MG tablet Take 0.6 mg by mouth as needed.  0  . etodolac (LODINE) 200 MG capsule Take 1 capsule (200 mg total) by mouth 2 (two) times daily. 10 capsule 0  . ferrous sulfate 325 (65 FE) MG EC tablet Take 1 tablet by mouth a day with orange juice or vitamin C 30 tablet 1  . folic acid (FOLVITE) 1 MG tablet take 1 tablet by mouth once daily 30 tablet 1  . gabapentin (NEURONTIN) 300 MG capsule     . hydrALAZINE (APRESOLINE) 25 MG tablet     . hydrOXYzine (ATARAX/VISTARIL) 25 MG tablet Take 25 mg by mouth 3 (three) times daily as needed.    . Ibrutinib 420 MG TABS Take 420 mg by mouth daily. 30 tablet 2  . ibuprofen (ADVIL,MOTRIN) 600 MG tablet Take 600 mg by mouth every 6 (six) hours as needed.     Marland Kitchen lisinopril-hydrochlorothiazide (PRINZIDE,ZESTORETIC) 20-12.5 MG per tablet  Take 1 tablet by mouth daily.    . metoprolol tartrate (LOPRESSOR) 25 MG tablet Take 25 mg by mouth 2 (two) times daily.  0  . nystatin cream (MYCOSTATIN) Apply topically.    Marland Kitchen omeprazole (PRILOSEC) 20 MG capsule Take 20 mg by mouth daily.  0  . polyethylene glycol (MIRALAX / GLYCOLAX) packet Take 17 g by mouth daily as needed for mild constipation. 14 each 1  . potassium chloride (K-DUR) 10 MEQ tablet take 1 tablet by mouth once daily 30 tablet 0   No current facility-administered medications for this visit.    Facility-Administered Medications Ordered in Other Visits  Medication Dose Route Frequency Provider Last Rate Last Dose  . cyanocobalamin ((VITAMIN B-12)) injection 1,000 mcg  1,000 mcg Intramuscular Once Lequita Asal, MD         Review of Systems:  GENERAL: Feels "fine". No problems.  No fevers or sweats. Weight up 9 pounds. PERFORMANCE STATUS (ECOG): 2 HEENT: No runny nose, sore throat, mouth sores or tenderness. Lungs:  No shortness of breath or cough.  No wheezing.  No hemoptysis. Cardiac: No chest pain, palpitations, orthopnea, or PND.  Blood pressure issues. GI: No nausea, vomiting, diarrhea, constipation, melena or hematochezia. GU: No urgency, frequency, dysuria or hematuria. Musculoskeletal: No back pain. No joint pain. No muscle tenderness. Extremities: No pain or swelling. Skin: No skin nodules. No rashes. Neuro: No headache, numbness or weakness, balance or coordination issues. Endocrine: No diabetes, thyroid issues, hot flashes or night sweats. Psych:No mood changes, depression or anxiety. Pain: No pain. Review of systems: All other systems reviewed and found to be negative.  Physical Exam:  Blood pressure (!) 169/70, pulse (!) 59, temperature 98.5 F (36.9 C), temperature source Tympanic, weight 205 lb (93 kg). GENERAL: Elderly woman sitting comfortably in the exam room in clinic in no acute distress. MENTAL STATUS: Alert and oriented to person, place and time. HEAD: Wearing a red hat.  Curly black wig with white highlights. Normocephalic, atraumatic, face symmetric, no Cushingoid features. EYES: Brown eyes. Pupils equal round and reactive to light and accomodation. No conjunctivitis or scleral icterus. ENT: Oropharynx clear without lesion. Tongue normal. Mucous membranes moist.  RESPIRATORY: Normal respiratory excursion. No rales, wheezes or rhonchi. CARDIOVASCULAR: Regular rate and rhythm without murmur, rub or gallop. ABDOMEN: Soft, non-tender with active bowel sounds and no appreciable hepatosplenomegaly. No guarding or rebound tenderness.  SKIN: Red and white painted nails.  No palpable nodules in subcutaneous tissues.  EXTREMITIES: No edema, no skin  discoloration or tenderness. No palpable cords. LYMPH NODES: No palpable cervical, supraclavicular, axillary or inguinal adenopathy  NEUROLOGICAL: Unremarkable. PSYCH: Appropriate   Appointment on 09/29/2017  Component Date Value Ref Range Status  . WBC 09/29/2017 4.0  3.6 - 11.0 K/uL Final  . RBC 09/29/2017 5.46* 3.80 - 5.20 MIL/uL Final  . Hemoglobin 09/29/2017 14.2  12.0 - 16.0 g/dL Final  . HCT 09/29/2017 44.2  35.0 - 47.0 % Final  . MCV 09/29/2017 81.0  80.0 - 100.0 fL Final  . MCH 09/29/2017 26.0  26.0 - 34.0 pg Final  . MCHC 09/29/2017 32.1  32.0 - 36.0 g/dL Final  . RDW 09/29/2017 16.2* 11.5 - 14.5 % Final  . Platelets 09/29/2017 84* 150 - 440 K/uL Final  . Neutrophils Relative % 09/29/2017 54  % Final  . Neutro Abs 09/29/2017 2.2  1.4 - 6.5 K/uL Final  . Lymphocytes Relative 09/29/2017 28  % Final  . Lymphs Abs 09/29/2017 1.1  1.0 -  3.6 K/uL Final  . Monocytes Relative 09/29/2017 15  % Final  . Monocytes Absolute 09/29/2017 0.6  0.2 - 0.9 K/uL Final  . Eosinophils Relative 09/29/2017 2  % Final  . Eosinophils Absolute 09/29/2017 0.1  0 - 0.7 K/uL Final  . Basophils Relative 09/29/2017 1  % Final  . Basophils Absolute 09/29/2017 0.0  0 - 0.1 K/uL Final   Performed at [facility], [address] [place], [place] [address]  . Sodium 09/29/2017 139  135 - 145 mmol/L Final  . Potassium 09/29/2017 4.2  3.5 - 5.1 mmol/L Final  . Chloride 09/29/2017 104  101 - 111 mmol/L Final  . CO2 09/29/2017 27  22 - 32 mmol/L Final  . Glucose, Bld 09/29/2017 102* 65 - 99 mg/dL Final  . BUN 09/29/2017 22* 6 - 20 mg/dL Final  . Creatinine, Ser 09/29/2017 1.28* 0.44 - 1.00 mg/dL Final  . Calcium 09/29/2017 8.7* 8.9 - 10.3 mg/dL Final  . Total Protein 09/29/2017 7.4  6.5 - 8.1 g/dL Final  . Albumin 09/29/2017 3.3* 3.5 - 5.0 g/dL Final  . AST 09/29/2017 19  15 - 41 U/L Final  . ALT 09/29/2017 15  14 - 54 U/L Final  . Alkaline Phosphatase 09/29/2017 45  38 - 126 U/L Final  . Total  Bilirubin 09/29/2017 0.8  0.3 - 1.2 mg/dL Final  . GFR calc non Af Amer 09/29/2017 37* >60 mL/min Final  . GFR calc Af Amer 09/29/2017 43* >60 mL/min Final   Comment: (NOTE) The eGFR has been calculated using the CKD EPI equation. This calculation has not been validated in all clinical situations. eGFR's persistently <60 mL/min signify possible Chronic Kidney Disease.   Kristy Solomon gap 09/29/2017 8  5 - 15 Final   Performed at [facility], [address] [place],  [id number]    Assessment:  SHAHED Solomon is a 82 y.o. 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 She presented with massive subcutaneous deposits, pulmonary nodules, involvement of left kidney, and bowel. Disease was unresponsive to Rituxan in 2014.   Chest, abdomen, and pelvic CT scan on 03/17/2016 revealed progression of diffuse lymphoma with subcutaneous deposits throughout the subcutaneous fat of the chest, abdomen,and pelvis. There were multiple nodular infiltrates throughout the lungs, likely due to lymphoma but could also represent infection or atypical infection. There was massive retroperitoneal lymphadenopathy with diffuse retroperitoneal, mesenteric, and pelvic lymphadenopathy. There was wall thickening and pelvicsmall bowel probably representing lymphomas involvement. There was probable direct invasion of the left kidney.  Biopsy of the dominant SQ nodal conglomeration around the right flank on 03/18/2016 revealed a persistent low-grade CD5 positive B-cell lymphoma.  Flow cytometry was positive for CD20, CD79a, CD5, CD138. Neoplastic cells were positive for kappa and cyclin D1.  CD 23 was negative.   Additional molecular markers are pending.  Bone marrow aspirate and biopsy on 03/18/2016 revealed no evidence of lymphoma or plasma cell neoplasm (0.2% kappa monoclonal plasma cells by flow analysis).  Marrow was hypercellular for age (50-60%) with trilineage  hematopoiesis and no increased blasts. There was mild to moderate widespread increase in reticulin fibers. There was no storage iron detected.  The following studies were abnormal:  Beta2-microglobulin was 9.6 (0.6-2.4).  Serum viscosity was 5.5 (1.6-1.9).  Uric acid was 11.1 on 03/19/2016 and 5.7 on 05/23/2016.     Normal studies included:  hepatitis B surface antigen, hepatitis B core antibody total, hepatitis C antibody, HIV testing, and LDH (123).  G6PD  assay was 9.2 (4.6-13.5).   She has iron deficiency (ferritin 27; iron saturation 11%), B12 deficiency (147), and folate deficiency (5.9).  She has chronic renal insufficiency (BUN was 30-35, creatinine was 1.38 - 1.80 with a GFR 29-39 ml/min).  She receives B12 (began 03/28/2016; last 08/28/2017).  She is on folic acid.  Folate was 77.5 on 05/09/2016 and 42 on 03/13/2017.   She received 1 week of chlorambucil with a tapering dose of prednisone (began 03/30/2016).  Cortisol was 9.5 on 05/17/2016.  She began ibrutinib on 04/26/2016.  She has mild thrombocytopenia (94,000).   SPEP has been followed:  5.0 gm/dL on 03/18/2016, 4.5 on 04/25/2016,  2.3 on 06/20/2016, 2.0 on 10/24/2016, 1.5 on 12/19/2016, 1.4 on 03/13/2017, and 1.1 on 07/01/2017 .  IgM has been followed:  > 5850 on 03/18/2016, > 5850 on 04/25/2016, 4652 on 06/09/2016, 3719 on 07/18/2016, 3429 on 10/24/2016, 2821 on 12/19/2016, 2295 on 03/13/2017, 2500 on 06/30/2017, and 1890 09/29/2017.  Chest, abdomen, and pelvic CT scan on 04/21/2016 revealed a partial response to therapy.  There were bilateral pulmonary nodules/masses, measuring up to 5.6 cm in the right lower lobe (improved).  There was trace left pleural effusion.  There was a 12.7 cm left renal/perirenal mass (decreased) with associated mild left hydronephrosis.  Retroperitoneal/left pelvic lymphadenopathy was mildly decreased.  Multifocal subcutaneous nodules, measuring up to 4.5 cm in the left lower anterior abdominal wall were  stable to mildly improved.  She denies any B symptoms. Exam is unremarkable.  WBC 4000 with ANC of 2200. Hemoglobin is 14.2, hematocrit 44.2, and platelets 84,000.  Plan: 1.  Labs today:  CBC with diff, CMP, SPEP, IgM, ferritin, iron studies. 2.  Continue Ibrutinib 420 mg a day. 3.  Follow-up today with oral chemo pharmacist for specialty pharmacy routine follow up.  4.  B12 injection today. 5.  RTC monthly x 2 for B12 injections 6.  RTC in 6 weeks for labs (CBC with diff); schedule on same day as B12 injection. 7.  RTC in 3 months for MD assessment, labs (CBC with diff, CMP, SPEP, IgM), and B12. 8.  Arrange for Lucianne Lei to transport patient. She cites that her missed appointments have been because she has transportation issues.    Honor Loh, NP  09/29/2017, 11:02 AM   I saw and evaluated the patient, participating in the key portions of the service and reviewing pertinent diagnostic studies and records.  I reviewed the nurse practitioner's note and agree with the findings and the plan.  The assessment and plan were discussed with the patient.  A few questions were asked by the patient and answered.   Lequita Asal, MD 09/29/2017, 11:02 AM

## 2017-09-29 NOTE — Progress Notes (Signed)
Patient here today for follow up.   

## 2017-09-30 ENCOUNTER — Encounter: Payer: Self-pay | Admitting: Hematology and Oncology

## 2017-09-30 LAB — IGM: IgM (Immunoglobulin M), Srm: 1890 mg/dL — ABNORMAL HIGH (ref 26–217)

## 2017-10-02 LAB — PROTEIN ELECTROPHORESIS, SERUM
A/G Ratio: 0.8 (ref 0.7–1.7)
Albumin ELP: 3.1 g/dL (ref 2.9–4.4)
Alpha-1-Globulin: 0.3 g/dL (ref 0.0–0.4)
Alpha-2-Globulin: 0.7 g/dL (ref 0.4–1.0)
Beta Globulin: 0.9 g/dL (ref 0.7–1.3)
Gamma Globulin: 1.9 g/dL — ABNORMAL HIGH (ref 0.4–1.8)
Globulin, Total: 3.8 g/dL (ref 2.2–3.9)
M-Spike, %: 1.1 g/dL — ABNORMAL HIGH
Total Protein ELP: 6.9 g/dL (ref 6.0–8.5)

## 2017-10-05 ENCOUNTER — Other Ambulatory Visit: Payer: Self-pay | Admitting: Hematology and Oncology

## 2017-10-05 DIAGNOSIS — C8300 Small cell B-cell lymphoma, unspecified site: Secondary | ICD-10-CM

## 2017-10-09 MED FILL — IMBRUVICA 420 MG TAB: 420 | 28 days supply | Qty: 28 | Fill #0

## 2017-10-30 ENCOUNTER — Inpatient Hospital Stay: Payer: Medicare HMO | Attending: Hematology and Oncology

## 2017-10-30 ENCOUNTER — Inpatient Hospital Stay: Payer: Medicare HMO

## 2017-11-13 ENCOUNTER — Telehealth: Payer: Self-pay | Admitting: Hematology and Oncology

## 2017-11-13 MED FILL — IMBRUVICA 420 MG TAB: 420 | 28 days supply | Qty: 28 | Fill #1

## 2017-11-13 NOTE — Telephone Encounter (Signed)
Oral Oncology Patient Advocate Encounter  Got in touch with patient to set up her fill for her medication. She said she still had like 5 days worth. Sent e-mail to [facility] to please mail her medication out. Told her to call if she needs [place].   [facility] Patient Advocate [phone] 11/13/2017 3:03 PM

## 2017-11-16 ENCOUNTER — Ambulatory Visit: Payer: Commercial Managed Care - HMO | Admitting: Podiatry

## 2017-11-27 ENCOUNTER — Inpatient Hospital Stay: Payer: Medicare HMO

## 2017-11-30 ENCOUNTER — Inpatient Hospital Stay: Payer: Medicare HMO | Attending: Hematology and Oncology

## 2017-11-30 ENCOUNTER — Inpatient Hospital Stay: Payer: Medicare HMO

## 2017-11-30 DIAGNOSIS — E538 Deficiency of other specified B group vitamins: Secondary | ICD-10-CM | POA: Diagnosis not present

## 2017-11-30 MED ORDER — CYANOCOBALAMIN 1000 MCG/ML IJ SOLN
1000.0000 ug | Freq: Once | INTRAMUSCULAR | Status: AC
  Administered 2017-11-30: 1000 ug via INTRAMUSCULAR

## 2017-12-04 ENCOUNTER — Ambulatory Visit: Payer: Medicare HMO | Admitting: Podiatry

## 2017-12-05 MED FILL — IMBRUVICA 420 MG TAB: 420 | 28 days supply | Qty: 28 | Fill #2

## 2017-12-11 ENCOUNTER — Ambulatory Visit (INDEPENDENT_AMBULATORY_CARE_PROVIDER_SITE_OTHER): Payer: Medicare HMO | Admitting: Podiatry

## 2017-12-11 ENCOUNTER — Encounter: Payer: Self-pay | Admitting: Podiatry

## 2017-12-11 DIAGNOSIS — B351 Tinea unguium: Secondary | ICD-10-CM | POA: Diagnosis not present

## 2017-12-11 DIAGNOSIS — M79674 Pain in right toe(s): Secondary | ICD-10-CM

## 2017-12-11 DIAGNOSIS — M79675 Pain in left toe(s): Secondary | ICD-10-CM

## 2017-12-11 NOTE — Progress Notes (Signed)
   Subjective:    Patient ID: Kristy Solomon, female    DOB: 1929/07/25, 84 y.o.   MRN: [id number]  HPIthis patient presents the office with chief complaint of long thick painful nails.  Patient states nails are painful walking and wearing her shoes.  Patient is unable to self treat.  Patient has nail polish on her toenails from her last pedicure. Patient presents the office today for preventative foot care services.  She desires emporary removal of her first and second toenails, both feet. If possible.    Review of Systems  All other systems reviewed and are negative.      Objective:   Physical Exam General Appearance  Alert, conversant and in no acute stress.  Vascular  Dorsalis pedis  Are weakly  palpable  bilaterally. Posterior tibial pulses are non palpable due to swelling. Capillary return is within normal limits  bilaterally. Temperature is within normal limits  bilaterally.  Neurologic  Senn-Weinstein monofilament wire test within normal limits  bilaterally. Muscle power within normal limits bilaterally.  Nails Thick disfigured discolored nails with subungual debris  from hallux to fifth toes bilaterally. No evidence of bacterial infection or drainage bilaterally.  Orthopedic  No limitations of motion of motion feet .  No crepitus or effusions noted.  HAV  B/L.  Skin  normotropic skin with no porokeratosis noted bilaterally.  No signs of infections or ulcers noted.          Assessment & Plan:  Onychomycosis  B/L    IE.  Debride nails x 10.  RTC 3 months  atient was explained conservative treatment versus surgical treatment.  I recommended we treat her conservatively until she is unable to tolerate the pain from her toenails.   Gardiner Barefoot DPM

## 2017-12-19 ENCOUNTER — Telehealth: Payer: Self-pay

## 2017-12-19 NOTE — Telephone Encounter (Signed)
Oral Chemotherapy Pharmacy Student Encounter Follow-Up Form  Called patient today to follow up regarding patient's oral chemotherapy medication: Imbruvica (ibrutinib) 420mg  by mouth daily  Original Start date of oral chemotherapy: 04/26/2016   Pt reports no tablets/doses of Imbruvica missed in the last month.   Pt reports no side effects, new medications, or issues with the Imbruvica.    Patient knows to call the office with questions or concerns. Oral Oncology Clinic will continue to follow.  Karmen Stabs, PharmD Candidate 12/19/17

## 2017-12-22 ENCOUNTER — Other Ambulatory Visit: Payer: Self-pay | Admitting: Hematology and Oncology

## 2017-12-22 ENCOUNTER — Telehealth: Payer: Self-pay | Admitting: Pharmacist

## 2017-12-22 DIAGNOSIS — C8300 Small cell B-cell lymphoma, unspecified site: Secondary | ICD-10-CM

## 2017-12-22 NOTE — Telephone Encounter (Signed)
Oral Chemotherapy Pharmacist Encounter   Clinic received a call from the patient saying she was out of her medication. I looked toconfirmed tracking information, a 28 day supply of medication was delivered to the patient on 12/06/17. She should still have at least 12 day of medication.   I called a spoke with Kristy Solomon and she said she received her medication on 12/06/17 but she was out. I asked the patient to verify that the Imbruvica blister packet were out of medication. She stated that she has already thrown away the packet. I verified with the patient that she was taking her Imbruvica as instructed.   I will look into whether her insurance will let her medication be filled early. Will follow-up with patient.   Darl Pikes, PharmD, BCPS Hematology/Oncology Clinical Pharmacist [facility] [phone]  12/22/2017 11:40 AM

## 2017-12-22 NOTE — Telephone Encounter (Signed)
Oral Chemotherapy Pharmacist Encounter   Spoke with [facility] and they can fill her medication early on 12/26/17. She will have her medication in hand on 12/27/17. Spoke with Gaspar Bidding, NP and informed him of Ms. Fayad's back in therapy.   Darl Pikes, PharmD, BCPS Hematology/Oncology Clinical Pharmacist [facility] [phone]  12/22/2017 4:00 PM

## 2017-12-22 NOTE — Telephone Encounter (Signed)
Patient missing some of her medicine as she is completely out of medicine. She either lost it or inadvertently threw it away

## 2017-12-26 MED FILL — IMBRUVICA 420 MG TAB: 420 | 28 days supply | Qty: 28 | Fill #0

## 2017-12-27 ENCOUNTER — Other Ambulatory Visit: Payer: Self-pay | Admitting: *Deleted

## 2017-12-27 DIAGNOSIS — C8300 Small cell B-cell lymphoma, unspecified site: Secondary | ICD-10-CM

## 2017-12-28 ENCOUNTER — Emergency Department: Payer: Medicare HMO

## 2017-12-28 ENCOUNTER — Other Ambulatory Visit: Payer: Self-pay

## 2017-12-28 ENCOUNTER — Other Ambulatory Visit: Payer: Self-pay | Admitting: Hematology and Oncology

## 2017-12-28 ENCOUNTER — Inpatient Hospital Stay: Payer: Medicare HMO

## 2017-12-28 ENCOUNTER — Observation Stay
Admission: EM | Admit: 2017-12-28 | Discharge: 2017-12-29 | Disposition: A | Discharge status: Home / Self Care | Payer: Medicare HMO | Attending: Internal Medicine | Admitting: Internal Medicine

## 2017-12-28 ENCOUNTER — Inpatient Hospital Stay: Payer: Medicare HMO | Admitting: Hematology and Oncology

## 2017-12-28 DIAGNOSIS — N178 Other acute kidney failure: Secondary | ICD-10-CM | POA: Insufficient documentation

## 2017-12-28 DIAGNOSIS — I119 Hypertensive heart disease without heart failure: Secondary | ICD-10-CM | POA: Insufficient documentation

## 2017-12-28 DIAGNOSIS — K7689 Other specified diseases of liver: Secondary | ICD-10-CM | POA: Insufficient documentation

## 2017-12-28 DIAGNOSIS — Z800 Family history of malignant neoplasm of digestive organs: Secondary | ICD-10-CM | POA: Insufficient documentation

## 2017-12-28 DIAGNOSIS — J8410 Pulmonary fibrosis, unspecified: Secondary | ICD-10-CM | POA: Diagnosis not present

## 2017-12-28 DIAGNOSIS — I491 Atrial premature depolarization: Secondary | ICD-10-CM | POA: Insufficient documentation

## 2017-12-28 DIAGNOSIS — E538 Deficiency of other specified B group vitamins: Secondary | ICD-10-CM

## 2017-12-28 DIAGNOSIS — Z808 Family history of malignant neoplasm of other organs or systems: Secondary | ICD-10-CM | POA: Insufficient documentation

## 2017-12-28 DIAGNOSIS — Z7189 Other specified counseling: Secondary | ICD-10-CM | POA: Insufficient documentation

## 2017-12-28 DIAGNOSIS — D509 Iron deficiency anemia, unspecified: Secondary | ICD-10-CM | POA: Diagnosis not present

## 2017-12-28 DIAGNOSIS — E790 Hyperuricemia without signs of inflammatory arthritis and tophaceous disease: Secondary | ICD-10-CM

## 2017-12-28 DIAGNOSIS — N2889 Other specified disorders of kidney and ureter: Secondary | ICD-10-CM | POA: Diagnosis not present

## 2017-12-28 DIAGNOSIS — D696 Thrombocytopenia, unspecified: Secondary | ICD-10-CM | POA: Diagnosis not present

## 2017-12-28 DIAGNOSIS — K5289 Other specified noninfective gastroenteritis and colitis: Secondary | ICD-10-CM | POA: Diagnosis not present

## 2017-12-28 DIAGNOSIS — Z90710 Acquired absence of both cervix and uterus: Secondary | ICD-10-CM | POA: Insufficient documentation

## 2017-12-28 DIAGNOSIS — E860 Dehydration: Secondary | ICD-10-CM | POA: Diagnosis not present

## 2017-12-28 DIAGNOSIS — K529 Noninfective gastroenteritis and colitis, unspecified: Secondary | ICD-10-CM | POA: Diagnosis not present

## 2017-12-28 DIAGNOSIS — Z8572 Personal history of non-Hodgkin lymphomas: Secondary | ICD-10-CM | POA: Insufficient documentation

## 2017-12-28 DIAGNOSIS — I4581 Long QT syndrome: Secondary | ICD-10-CM | POA: Insufficient documentation

## 2017-12-28 DIAGNOSIS — Z7982 Long term (current) use of aspirin: Secondary | ICD-10-CM | POA: Diagnosis not present

## 2017-12-28 DIAGNOSIS — Z79899 Other long term (current) drug therapy: Secondary | ICD-10-CM | POA: Insufficient documentation

## 2017-12-28 DIAGNOSIS — I9589 Other hypotension: Secondary | ICD-10-CM | POA: Diagnosis not present

## 2017-12-28 DIAGNOSIS — K567 Ileus, unspecified: Secondary | ICD-10-CM | POA: Diagnosis not present

## 2017-12-28 DIAGNOSIS — C7800 Secondary malignant neoplasm of unspecified lung: Secondary | ICD-10-CM | POA: Diagnosis not present

## 2017-12-28 DIAGNOSIS — R109 Unspecified abdominal pain: Secondary | ICD-10-CM

## 2017-12-28 DIAGNOSIS — K56609 Unspecified intestinal obstruction, unspecified as to partial versus complete obstruction: Secondary | ICD-10-CM

## 2017-12-28 DIAGNOSIS — K449 Diaphragmatic hernia without obstruction or gangrene: Secondary | ICD-10-CM | POA: Diagnosis not present

## 2017-12-28 DIAGNOSIS — I700 Atherosclerosis of aorta: Secondary | ICD-10-CM | POA: Diagnosis not present

## 2017-12-28 DIAGNOSIS — E785 Hyperlipidemia, unspecified: Secondary | ICD-10-CM | POA: Diagnosis not present

## 2017-12-28 LAB — COMPREHENSIVE METABOLIC PANEL
ALBUMIN: 3.4 g/dL — AB (ref 3.5–5.0)
ALK PHOS: 47 U/L (ref 38–126)
ALT: 23 U/L (ref 14–54)
AST: 33 U/L (ref 15–41)
Anion gap: 12 (ref 5–15)
BUN: 33 mg/dL — ABNORMAL HIGH (ref 6–20)
CO2: 23 mmol/L (ref 22–32)
Calcium: 8.5 mg/dL — ABNORMAL LOW (ref 8.9–10.3)
Chloride: 106 mmol/L (ref 101–111)
Creatinine, Ser: 1.41 mg/dL — ABNORMAL HIGH (ref 0.44–1.00)
GFR calc Af Amer: 38 mL/min — ABNORMAL LOW (ref >60)
GFR calc non Af Amer: 33 mL/min — ABNORMAL LOW (ref >60)
GLUCOSE: 132 mg/dL — AB (ref 65–99)
Potassium: 4.3 mmol/L (ref 3.5–5.1)
SODIUM: 141 mmol/L (ref 135–145)
Total Bilirubin: 0.8 mg/dL (ref 0.3–1.2)
Total Protein: 7.8 g/dL (ref 6.5–8.1)

## 2017-12-28 LAB — LACTIC ACID, PLASMA: Lactic Acid, Venous: 1.7 mmol/L (ref 0.5–1.9)

## 2017-12-28 LAB — CBC
HCT: 47.7 % — ABNORMAL HIGH (ref 35.0–47.0)
HEMOGLOBIN: 15 g/dL (ref 12.0–16.0)
MCH: 25.6 pg — AB (ref 26.0–34.0)
MCHC: 31.5 g/dL — AB (ref 32.0–36.0)
MCV: 81.3 fL (ref 80.0–100.0)
Platelets: 102 10*3/uL — ABNORMAL LOW (ref 150–440)
RBC: 5.86 MIL/uL — ABNORMAL HIGH (ref 3.80–5.20)
RDW: 16.9 % — ABNORMAL HIGH (ref 11.5–14.5)
WBC: 11.5 10*3/uL — ABNORMAL HIGH (ref 3.6–11.0)

## 2017-12-28 LAB — LIPASE, BLOOD: Lipase: 30 U/L (ref 11–51)

## 2017-12-28 LAB — INFLUENZA PANEL BY PCR (TYPE A & B)
Influenza A By PCR: NEGATIVE
Influenza B By PCR: NEGATIVE

## 2017-12-28 MED ORDER — ONDANSETRON HCL 4 MG/2ML IJ SOLN: 4.0000 mg | Freq: Four times a day (QID) | INTRAMUSCULAR | Status: DC | PRN

## 2017-12-28 MED ORDER — PANTOPRAZOLE SODIUM 40 MG PO TBEC
40.0000 mg | DELAYED_RELEASE_TABLET | Freq: Every day | ORAL | Status: DC
  Administered 2017-12-29: 40 mg via ORAL
  Filled 2017-12-28: qty 1

## 2017-12-28 MED ORDER — ALLOPURINOL 100 MG PO TABS
200.0000 mg | ORAL_TABLET | Freq: Every day | ORAL | Status: DC
  Administered 2017-12-29: 200 mg via ORAL
  Filled 2017-12-28: qty 2

## 2017-12-28 MED ORDER — SODIUM CHLORIDE 0.9 % IV SOLN: Freq: Once | INTRAVENOUS | Status: DC

## 2017-12-28 MED ORDER — SODIUM CHLORIDE 0.9 % IV BOLUS
500.0000 mL | Freq: Once | INTRAVENOUS | Status: AC
  Administered 2017-12-28: 500 mL via INTRAVENOUS

## 2017-12-28 MED ORDER — HEPARIN SODIUM (PORCINE) 5000 UNIT/ML IJ SOLN
5000.0000 [IU] | Freq: Three times a day (TID) | INTRAMUSCULAR | Status: DC
  Administered 2017-12-28 – 2017-12-29 (×3): 5000 [IU] via SUBCUTANEOUS
  Filled 2017-12-28 (×3): qty 1

## 2017-12-28 MED ORDER — ATORVASTATIN CALCIUM 20 MG PO TABS
20.0000 mg | ORAL_TABLET | Freq: Every day | ORAL | Status: DC
  Administered 2017-12-29: 20 mg via ORAL
  Filled 2017-12-28: qty 1

## 2017-12-28 MED ORDER — FENTANYL CITRATE (PF) 100 MCG/2ML IJ SOLN
25.0000 ug | INTRAMUSCULAR | Status: DC | PRN
  Administered 2017-12-28: 25 ug via INTRAVENOUS
  Filled 2017-12-28: qty 2

## 2017-12-28 MED ORDER — LIDOCAINE HCL (PF) 4 % IJ SOLN
5.0000 mL | Freq: Once | INTRAMUSCULAR | Status: DC
  Filled 2017-12-28: qty 5

## 2017-12-28 MED ORDER — GABAPENTIN 300 MG PO CAPS
300.0000 mg | ORAL_CAPSULE | Freq: Three times a day (TID) | ORAL | Status: DC
  Administered 2017-12-28 – 2017-12-29 (×3): 300 mg via ORAL
  Filled 2017-12-28 (×3): qty 1

## 2017-12-28 MED ORDER — ONDANSETRON HCL 4 MG PO TABS: 4.0000 mg | ORAL_TABLET | Freq: Four times a day (QID) | ORAL | Status: DC | PRN

## 2017-12-28 MED ORDER — ACETAMINOPHEN 650 MG RE SUPP: 650.0000 mg | Freq: Four times a day (QID) | RECTAL | Status: DC | PRN

## 2017-12-28 MED ORDER — ALBUTEROL SULFATE (2.5 MG/3ML) 0.083% IN NEBU: 2.5000 mg | INHALATION_SOLUTION | RESPIRATORY_TRACT | Status: DC | PRN

## 2017-12-28 MED ORDER — ACETAMINOPHEN 325 MG PO TABS: 650.0000 mg | ORAL_TABLET | Freq: Four times a day (QID) | ORAL | Status: DC | PRN

## 2017-12-28 MED ORDER — ONDANSETRON HCL 4 MG/2ML IJ SOLN
4.0000 mg | Freq: Once | INTRAMUSCULAR | Status: AC
  Administered 2017-12-28: 4 mg via INTRAVENOUS
  Filled 2017-12-28: qty 2

## 2017-12-28 MED ORDER — HYDROCODONE-ACETAMINOPHEN 5-325 MG PO TABS: 1.0000 | ORAL_TABLET | ORAL | Status: DC | PRN

## 2017-12-28 MED ORDER — POTASSIUM CHLORIDE IN NACL 20-0.9 MEQ/L-% IV SOLN
INTRAVENOUS | Status: DC
  Administered 2017-12-28: via INTRAVENOUS
  Filled 2017-12-28 (×4): qty 1000

## 2017-12-28 MED ORDER — IOPAMIDOL (ISOVUE-300) INJECTION 61%
75.0000 mL | Freq: Once | INTRAVENOUS | Status: AC | PRN
  Administered 2017-12-28: 75 mL via INTRAVENOUS

## 2017-12-28 NOTE — ED Notes (Signed)
Admitting MD at bedside.

## 2017-12-28 NOTE — Progress Notes (Signed)
CODE SEPSIS - PHARMACY COMMUNICATION  **Broad Spectrum Antibiotics should be administered within 1 hour of Sepsis diagnosis**  Time Code Sepsis Called/Page Received:   Antibiotics Ordered: none  Time of 1st antibiotic administration:   Additional action taken by pharmacy: MD plan is d/c abx/code sepsis per RN  If necessary, Name of Provider/Nurse Contacted: Marja Kays ,PharmD Clinical Pharmacist  12/28/2017  6:57 PM

## 2017-12-28 NOTE — ED Notes (Addendum)
Pt given crackers with peanut butter and water, per EDP.

## 2017-12-28 NOTE — ED Notes (Signed)
Code sepsis called.

## 2017-12-28 NOTE — ED Triage Notes (Signed)
FIRST NURSE NOTE-here for weakness. Slumped over, does not appear well. Pulled next for triage.

## 2017-12-28 NOTE — ED Provider Notes (Signed)
[facility] Emergency Department Provider Note    First MD Initiated Contact with Patient 12/28/17 1620     (approximate)  I have reviewed the triage vital signs and the nursing notes.   HISTORY  Chief Complaint Emesis; Diarrhea; and Weakness    HPI Kristy Solomon is a 84 y.o. female presents with chief complaint of nausea vomiting as well as diarrhea and achy abdominal pain.  Did have sick exposure yesterday.  Woke up this morning.  No measured fevers but did feel achy.  No recent abdominal surgeries.  No recent antibiotics.  States the pain is achy and mild.  Past Medical History:  Diagnosis Date  . 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 Hyperlipidemia   . Hypertension   . 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 Renal insufficiency    Family History  Problem Relation Age of Onset  . Cancer Brother        throat ca  . Cancer Brother        bone cancer   Past Surgical History:  Procedure Laterality Date  . ABDOMINAL HYSTERECTOMY    . HEMORRHOID SURGERY     Patient Active Problem List   Diagnosis Date Noted  . Goals of care, counseling/discussion 12/28/2017  . B12 deficiency 03/18/2016  . Folate deficiency 03/18/2016  . Iron deficiency anemia 03/18/2016  . Pneumonia 03/17/2016  . Pressure ulcer 03/17/2016  . Thrombocytopenia ([place]) 07/04/2014  . Acute renal failure ([place]) 07/04/2014  . Hyperuricemia 07/04/2014  . 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 Calculi, ureter 07/03/2012  . Frank hematuria 07/03/2012  . Hydronephrosis 07/03/2012  . 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 Calculus of kidney 07/02/2012  . Nonspecific finding on examination of urine 07/02/2012      Prior to Admission medications     Medication Sig Start Date End Date Taking? Authorizing Provider  allopurinol (ZYLOPRIM) 100 MG tablet take 2 tablets by mouth once daily 11/09/16   Lequita Asal, MD  amLODipine-benazepril (LOTREL) 5-20 MG capsule  10/27/17   [provider]  amLODipine-olmesartan (AZOR) 5-40 MG tablet  08/16/16   [provider]  aspirin EC 81 MG tablet Take 81 mg by mouth daily. 06/27/12   [provider]  atorvastatin (LIPITOR) 20 MG tablet Take 20 mg by mouth daily. 12/24/13   [provider]  Calcium Carbonate-Vitamin D 600-400 MG-UNIT tablet Take by mouth.    [provider]  chlorambucil (LEUKERAN) 2 MG tablet Take 3 tablets (6 mg total) by mouth daily. Give on an empty stomach 1 hour before or 2 hours after meals. 03/28/16   Lequita Asal, MD  colchicine 0.6 MG tablet Take 0.6 mg by mouth as needed. 02/24/17   [provider]  etodolac (LODINE) 200 MG capsule Take 1 capsule (200 mg total) by mouth 2 (two) times daily. 12/13/16   Sable Feil, PA-C  ferrous sulfate 325 (65 FE) MG EC tablet Take 1 tablet by mouth a day with orange juice or vitamin C 03/23/16   Lequita Asal, MD  folic acid (FOLVITE) 1 MG tablet take 1 tablet by mouth once daily 07/09/17   Lequita Asal, MD  gabapentin (NEURONTIN) 300 MG capsule  08/16/16   [provider]  hydrALAZINE (APRESOLINE) 25 MG tablet  06/02/16  [provider]  hydrOXYzine (ATARAX/VISTARIL) 25 MG tablet Take 25 mg by mouth 3 (three) times daily as needed.    [provider]  ibuprofen (ADVIL,MOTRIN) 600 MG tablet Take 600 mg by mouth every 6 (six) hours as needed.     [provider]  IMBRUVICA 420 MG TABS TAKE 420 MG BY MOUTH DAILY. 12/22/17   Karen Kitchens, NP  lisinopril-hydrochlorothiazide (PRINZIDE,ZESTORETIC) 20-12.5 MG per tablet Take 1 tablet by mouth daily. 12/24/13   [provider]  metoprolol tartrate (LOPRESSOR) 25 MG tablet Take 25 mg by  mouth 2 (two) times daily. 03/05/16   [provider]  nystatin cream (MYCOSTATIN) Apply topically.    [provider]  omeprazole (PRILOSEC) 20 MG capsule Take 20 mg by mouth daily. 03/02/16   [provider]  polyethylene glycol (MIRALAX / GLYCOLAX) packet Take 17 g by mouth daily as needed for mild constipation. 04/04/16   Lequita Asal, MD  potassium chloride (K-DUR) 10 MEQ tablet take 1 tablet by mouth once daily 08/20/16   Lequita Asal, MD    Allergies Patient has no known allergies.    Social History Social History   Tobacco Use  . Smoking status: Never Smoker  . Smokeless tobacco: Never Used  Substance Use Topics  . Alcohol use: No  . Drug use: No    Review of Systems Patient denies headaches, rhinorrhea, blurry vision, numbness, shortness of breath, chest pain, edema, cough, abdominal pain, nausea, vomiting, diarrhea, dysuria, fevers, rashes or hallucinations unless otherwise stated above in HPI. ____________________________________________   PHYSICAL EXAM:  VITAL SIGNS: Vitals:   12/28/17 1604 12/28/17 1605  BP:  (!) 73/45  Pulse: 71 81  Resp: 20   Temp: 99.5 F (37.5 C)   SpO2:  97%    Constitutional: Alert and oriented. Well appearing and in no acute distress. Eyes: Conjunctivae are normal.  Head: Atraumatic. Nose: No congestion/rhinnorhea. Mouth/Throat: Mucous membranes are moist.   Neck: No stridor. Painless ROM.  Cardiovascular: Normal rate, regular rhythm. Grossly normal heart sounds.  Good peripheral circulation. Respiratory: Normal respiratory effort.  No retractions. Lungs CTAB. Gastrointestinal: Soft with mild ttp diffusely. No distention. No abdominal bruits. No CVA tenderness. Genitourinary:  Musculoskeletal: No lower extremity tenderness nor edema.  No joint effusions. Neurologic:  Normal speech and language. No gross focal neurologic deficits are appreciated. No facial droop Skin:  Skin is warm, dry and  intact. No rash noted. Psychiatric: Mood and affect are normal. Speech and behavior are normal.  ____________________________________________   LABS (all labs ordered are listed, but only abnormal results are displayed)  Results for orders placed or performed during the hospital encounter of 12/28/17 (from the past 24 hour(s))  CBC     Status: Abnormal   Collection Time: 12/28/17  4:07 PM  Result Value Ref Range   WBC 11.5 (H) 3.6 - 11.0 K/uL   RBC 5.86 (H) 3.80 - 5.20 MIL/uL   Hemoglobin 15.0 12.0 - 16.0 g/dL   HCT 47.7 (H) 35.0 - 47.0 %   MCV 81.3 80.0 - 100.0 fL   MCH 25.6 (L) 26.0 - 34.0 pg   MCHC 31.5 (L) 32.0 - 36.0 g/dL   RDW 16.9 (H) 11.5 - 14.5 %   Platelets 102 (L) 150 - 440 K/uL   ____________________________________________  EKG My review and personal interpretation at Time: 16:16   Indication: nv  Rate: 70  Rhythm: sinus  Axis: left Other: non specific st abn, no stemi ____________________________________________  RADIOLOGY  I personally reviewed all radiographic images ordered to evaluate for the above acute complaints and reviewed radiology reports and findings.  These findings were personally discussed with the patient.  Please see medical record for radiology report.  ____________________________________________   PROCEDURES  Procedure(s) performed:  Procedures    Critical Care performed: no ____________________________________________   INITIAL IMPRESSION / ASSESSMENT AND PLAN / ED COURSE  Pertinent labs & imaging results that were available during my care of the patient were reviewed by me and considered in my medical decision making (see chart for details).  DDX: SBO, enteritis, gastritis, mass, UTI, flulike illness  Kristy Solomon is a 84 y.o. who presents to the ED with was as described above.  Blood work ordered for above differential shows mild leukocytosis.  Symptoms most clinically consistent with enteritis but based on her abdominal  pain will order CT imaging to evaluate for acute intra-abdominal process.  Will provide IV fluids as well as IV pain medication and IV antiemetics.  Clinical Course as of Dec 29 2014  Thu Dec 28, 2017  1813 Will cancel code sepsis.  Blood pressure seems to have normalized without any IV fluid resuscitation her lactate is normal.  Initial hypotension likely erroneous.   [PR]  2015 Patient has been evaluated by Dr. Rosana Hoes of general surgery at bedside.  Not felt to be clinically consistent with small bowel obstruction given her symptoms more concerning for gastroenteritis.  Do feel patient will require admission the hospital for IV fluids and hydration given her low blood pressures and persistent diarrhea.  He agrees to follow along for serial abdominal exams.  Have discussed with the patient and available family all diagnostics and treatments performed thus far and all questions were answered to the best of my ability. The patient demonstrates understanding and agreement with plan.    [PR]    Clinical Course User Index [PR] Merlyn Lot, MD     As part of my medical decision making, I reviewed the following data within the [place] notes reviewed and incorporated, Labs reviewed, notes from prior ED visits and [place] Controlled Substance Database   ____________________________________________   FINAL CLINICAL IMPRESSION(S) / ED DIAGNOSES  Final diagnoses:  Gastroenteritis      NEW MEDICATIONS STARTED DURING THIS VISIT:  New Prescriptions   No medications on file     Note:  This document was prepared using Dragon voice recognition software and may include unintentional dictation errors.    Merlyn Lot, MD 12/28/17 2027

## 2017-12-28 NOTE — ED Notes (Signed)
EDP aware of blood pressure 82/57. Fluids running at 162ml/hr at this time. No change in fluid rate d/t low blood pressure. Will continue to monitor.

## 2017-12-28 NOTE — H&P (Signed)
Kristy Solomon at [place] NAME: Kristy Solomon    MR#:  [id number]  DATE OF BIRTH:  07-Oct-1928  DATE OF ADMISSION:  12/28/2017  PRIMARY CARE PHYSICIAN: Jodi Marble, MD   REQUESTING/REFERRING PHYSICIAN: Dr. Quentin Cornwall.  CHIEF COMPLAINT:   Chief Complaint  Patient presents with  . Emesis  . Diarrhea  . Weakness   Nausea vomiting and diarrhea, generalized weakness today HISTORY OF PRESENT ILLNESS:  Kristy Solomon  is a 84 y.o. female with a known history of hypertension, hyperlipidemia, anemia, lymphoma and the renal insufficiency.  The patient was sent to ED from home due to above chief complaints.  The patient is alert awake and oriented.  She complains of abdominal pain with nausea, vomiting and diarrhea starting this morning.  She also complains of a generalized weakness but denies any fever or chills, no melena or bloody stool.  She was found hypotension dehydration, given normal saline bolus in the ED.  CAT scan of the abdomen showed Small bowel obstruction with transition zone at distal ileal level.  PAST MEDICAL HISTORY:   Past Medical History:  Diagnosis Date  . 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 Hyperlipidemia   . Hypertension   . 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 Renal insufficiency     PAST SURGICAL HISTORY:   Past Surgical History:  Procedure Laterality Date  . ABDOMINAL HYSTERECTOMY    . HEMORRHOID SURGERY      SOCIAL HISTORY:   Social History   Tobacco Use  . Smoking status: Never Smoker  . Smokeless tobacco: Never Used  Substance Use Topics  . Alcohol use: No    FAMILY HISTORY:   Family History  Problem Relation Age of Onset  . Cancer Brother        throat ca  . Cancer Brother        bone cancer    DRUG ALLERGIES:  No Known Allergies  REVIEW OF SYSTEMS:   Review of Systems  Constitutional: Positive for malaise/fatigue. Negative for chills and fever.  HENT:  Negative for sore throat.   Eyes: Negative for blurred vision and double vision.  Respiratory: Negative for cough, hemoptysis, shortness of breath, wheezing and stridor.   Cardiovascular: Negative for chest pain, palpitations, orthopnea and leg swelling.  Gastrointestinal: Positive for abdominal pain, nausea and vomiting. Negative for blood in stool, diarrhea and melena.  Genitourinary: Negative for dysuria, flank pain and hematuria.  Musculoskeletal: Negative for back pain and joint pain.  Skin: Negative for rash.  Neurological: Negative for dizziness, sensory change, focal weakness, seizures, loss of consciousness, weakness and headaches.  Endo/Heme/Allergies: Negative for polydipsia.  Psychiatric/Behavioral: Negative for depression. The patient is not nervous/anxious.     MEDICATIONS AT HOME:   Prior to Admission medications   Medication Sig Start Date End Date Taking? Authorizing Provider  allopurinol (ZYLOPRIM) 100 MG tablet take 2 tablets by mouth once daily 11/09/16  Yes Corcoran, Melissa C, MD  amLODipine-benazepril (LOTREL) 5-20 MG capsule Take 1 capsule by mouth daily.    Yes [provider]  atorvastatin (LIPITOR) 20 MG tablet Take 20 mg by mouth daily. 12/24/13  Yes [provider]  colchicine 0.6 MG tablet Take 0.6 mg by mouth as needed. For gout   Yes [provider]  furosemide (LASIX) 20 MG tablet Take 20 mg by mouth daily.   Yes [provider]  gabapentin (NEURONTIN) 300 MG capsule Take 300 mg by mouth 3 (three)  times daily.    Yes [provider]  hydrALAZINE (APRESOLINE) 25 MG tablet Take 25 mg by mouth 3 (three) times daily.    Yes [provider]  IMBRUVICA 420 MG TABS TAKE 420 MG BY MOUTH DAILY. 12/22/17  Yes Karen Kitchens, NP  metoprolol tartrate (LOPRESSOR) 25 MG tablet Take 25 mg by mouth 2 (two) times daily. 03/05/16  Yes [provider]  omeprazole (PRILOSEC) 20 MG capsule Take 20 mg by mouth daily. 03/02/16   Yes [provider]      VITAL SIGNS:  Blood pressure (!) 82/57, pulse 65, temperature 99.5 F (37.5 C), temperature source Oral, resp. rate (!) 21, height 5\' 3"  (1.6 m), weight 190 lb (86.2 kg), SpO2 94 %.  PHYSICAL EXAMINATION:  Physical Exam  GENERAL:  84 y.o.-year-old patient lying in the bed with no acute distress.  EYES: Pupils equal, round, reactive to light and accommodation. No scleral icterus. Extraocular muscles intact.  HEENT: Head atraumatic, normocephalic. Oropharynx and nasopharynx clear.  NECK:  Supple, no jugular venous distention. No thyroid enlargement, no tenderness.  LUNGS: Normal breath sounds bilaterally, no wheezing, rales,rhonchi or crepitation. No use of accessory muscles of respiration.  CARDIOVASCULAR: S1, S2 normal. No murmurs, rubs, or gallops.  ABDOMEN: Soft, nontender, nondistended. Bowel sounds present. No organomegaly or mass.  EXTREMITIES: No pedal edema, cyanosis, or clubbing.  NEUROLOGIC: Cranial nerves II through XII are intact. Muscle strength 5/5 in all extremities. Sensation intact. Gait not checked.  PSYCHIATRIC: The patient is alert and oriented x 3.  SKIN: No obvious rash, lesion, or ulcer.   LABORATORY PANEL:   CBC Recent Labs  Lab 12/28/17 1607  WBC 11.5*  HGB 15.0  HCT 47.7*  PLT 102*   ------------------------------------------------------------------------------------------------------------------  Chemistries  Recent Labs  Lab 12/28/17 1607  NA 141  K 4.3  CL 106  CO2 23  GLUCOSE 132*  BUN 33*  CREATININE 1.41*  CALCIUM 8.5*  AST 33  ALT 23  ALKPHOS 47  BILITOT 0.8   ------------------------------------------------------------------------------------------------------------------  Cardiac Enzymes No results for input(s): TROPONINI in the last 168 hours. ------------------------------------------------------------------------------------------------------------------  RADIOLOGY:  Ct Abdomen Pelvis W  Contrast  Result Date: 12/28/2017 CLINICAL DATA:  Vomiting and diarrhea. History of lymphoplasmacytic lymphoma EXAM: CT ABDOMEN AND PELVIS WITH CONTRAST TECHNIQUE: Multidetector CT imaging of the abdomen and pelvis was performed using the standard protocol following bolus administration of intravenous contrast. CONTRAST:  1mL ISOVUE-300 IOPAMIDOL (ISOVUE-300) INJECTION 61% COMPARISON:  March 17, 2016 and April 21, 2016 CT abdomen and pelvis examinations. FINDINGS: Lower chest: There are areas of fibrosis in each lung base. There is chronic consolidation in the left base laterally, similar in appearance to prior study. A small focus of pneumonia in this area cannot be excluded. There is a hiatal hernia. There are foci of coronary artery calcification. Hepatobiliary: There is a 7 mm cyst in the dome of the liver on the right. There is a tiny granuloma in the anterior dome of the liver. No other liver lesions are evident. Gallbladder wall is not appreciably thickened. There is no biliary duct dilatation. Pancreas: No pancreatic mass or inflammatory focus. Spleen: Spleen is normal in size and contour. No splenic lesions are evident. Adrenals/Urinary Tract: The right adrenal appears normal. There is generalized hypertrophy of the left adrenal. There remains mass with in the left kidney extending into the left renal vein region. Currently, the area of mass in the left kidney measures 6.1 x 5.0 cm. This mass measured 13.7  x 10.7 cm 20 months prior. 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 There is no new renal mass on either side. There remain multiple calculi in the right kidney. There is a calculus in the right mid kidney posteriorly measuring 2.0 x 1.7 cm. There is a calculus extending through the mid kidney into the renal pelvis to the level of the ureteropelvic junction measuring 2.6 x 2.0 cm. Several smaller calculi are noted in the right kidney  is well. There is calcification along the periphery of a cystic area in the lateral mid right kidney measuring 2.0 x 2.1 cm, stable. There are multiple similar appearing calcifications on the left, largest measuring 1.4 x 1.3 cm. There are cystic areas in the left kidney, largest measuring approximately 2.2 x 2.2 cm. There is no appreciable hydronephrosis on either side. No ureteral calculi are evident. Urinary bladder is midline with wall thickness within normal limits. Stomach/Bowel: There are multiple loops of mildly dilated small bowel. There is a transition zone in the distal ileal region, indicative of a degree of bowel obstruction. No free air or portal venous air. No mass is associated with bowel currently by CT. Vascular/Lymphatic: There is atherosclerotic calcification throughout the aorta and iliac arteries. There is extensive mesenteric arterial calcification without mesenteric arterial obstruction. In the left kidney region, there are multiple enlarged lymph nodes which are difficult to separate from the mass which appears to arise from the left kidney. Suspect combination of mass and adenopathy due to lymphoma in the left adrenal. This area is much less pronounced than on prior study. There is a focal lymph node in the right external iliac node chain measuring 1.7 x 1.2 cm. There has been considerable overall resolution of adenopathy compared to prior study from 2017. Reproductive: Uterus absent.  No pelvic mass appreciable. Other: There are scattered nodular lesions throughout the abdominal wall, greatly diminished compared to previous study consistent with considerable resolution of foci of lymphoma in the abdominal wall. No abscess or ascites is evident in the abdomen or pelvis. No periappendiceal region inflammation evident. Musculoskeletal: There is degenerative change in the lumbar spine. No blastic or lytic bone lesions. No intramuscular lesions are evident. IMPRESSION: 1. Small bowel  obstruction with transition zone at distal ileal level. No bowel region mass evident on this study. 2. Areas felt to represent lymphoma again noted. There is a mass which appears to arise from the left kidney which is considerably smaller than on the previous study, likely due to renal involvement of lymphoma in this area. There are left perirenal lymph nodes, likely due to lymphoma, smaller compared to previous study. There is a right external iliac artery node chain enlarged lymph node, likely due to lymphoma. Subcutaneous deposits bilaterally are considerably smaller compared to 2017 but felt to represent residual foci of lymphoma. 3. Sizable calculi in each kidney without obstruction. No hydronephrosis. No ureteral calculus. 4. Marked aortoiliac atherosclerosis. Multiple mesenteric arterial vascular calcifications. No frank major mesenteric vascular obstruction. Foci of coronary artery calcification noted. 5. Bibasilar pulmonary fibrosis. Question pneumonia versus scarring left base. 6.  Uterus absent. 7.  Hiatal hernia present. Aortic Atherosclerosis (ICD10-I70.0). Electronically Signed   By: Lowella Grip III M.D.   On: 12/28/2017 18:25   Dg Chest Port 1 View  Result Date: 12/28/2017 CLINICAL DATA:  Vomiting, weakness, chills, history of lymphoplasmacytic lymphoma EXAM: PORTABLE CHEST 1 VIEW COMPARISON:  CT chest of 04/21/2016 and portable  chest x-ray of 03/17/2016 FINDINGS: The lung metastasis is described on prior CT the chest are not well seen on today's chest x-ray. There is haziness at the left lung base some of which is due to overlapping breast tissue. However effusion cannot be excluded. Mediastinal and hilar contours are stable and moderate cardiomegaly is stable. No acute bony abnormality is seen. IMPRESSION: 1. The previously demonstrated lung metastasis are not well seen by chest x-ray. 2. Stable moderate cardiomegaly. Difficult to assess the left lung base. Electronically Signed   By: Ivar Drape M.D.   On: 12/28/2017 16:58      IMPRESSION AND PLAN:   Acute gastroenteritis with ileus. The patient will be placed for observation. N.p.o. except meds, IV fluids support, Zofran as needed.  Hypotension.  The patient was treated with normal saline bolus, blood pressure is better but is still soft.  Continue normal saline IV.  Hold hypertension medication.  Acute renal failure with dehydration due to above.  Continue IV fluid support and follow-up BMP.  Hypertension.  Hold hypertension medication.  All the records are reviewed and case discussed with ED provider. Management plans discussed with the patient, grandson and they are in agreement.  CODE STATUS: Full code.  TOTAL TIME TAKING CARE OF THIS PATIENT: 48 minutes.    Demetrios Loll M.D on 12/28/2017 at 9:05 PM  Between 7am to 6pm - Pager - [phone]  After 6pm go to [email or web address] - Proofreader  [facility]  Office  [phone]  CC: Primary care physician; Jodi Marble, MD   Note: This dictation was prepared with Dragon dictation along with smaller phrase technology. Any transcriptional errors that result from this process are unin

## 2017-12-28 NOTE — ED Notes (Signed)
No fluids or IV abx, per EDP. Code sepsis to be cancelled.

## 2017-12-28 NOTE — Progress Notes (Deleted)
[facility] day:  12/28/17  Chief Complaint: Kristy Solomon is a 84 y.o. female with lymphoplasmacytic lymphoma who is seen for 3 month assessment on Ibrutinib.  HPI: The patient was last seen in the medical oncology clinic on 09/29/2017.  At that time, she denied any B symptoms. Exam was unremarkable.  WBC was 4000 with ANC of 2200. Hemoglobin was 14.2, hematocrit 44.2, and platelets 84,000.  She received B12.  She continued ibrutinib.  She has continued monthly B12 (last 11/30/2017).  During the interim,    Past Medical History:  Diagnosis Date  . 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 Hyperlipidemia   . Hypertension   . Malignant lymphoma, lymphoplasmacytic ([place]) 12/27/2013  . Renal insufficiency     Past Surgical History:  Procedure Laterality Date  . ABDOMINAL HYSTERECTOMY    . HEMORRHOID SURGERY      Family History  Problem Relation Age of Onset  . Cancer Brother        throat ca  . Cancer Brother        bone cancer    Social History:  reports that she has never smoked. She has never used smokeless tobacco. She reports that she does not drink alcohol or use drugs.  She lives in [place] with her grandson and his wife. Her emergency contact is Cecille Po, her grandaughter- [phone]).  Kristy Solomon phone number [phone]).  She lives in [place].  She has transportation issues.  She is alone today.  Allergies: No Known Allergies  Current Medications: Current Outpatient Medications  Medication Sig Dispense Refill  . allopurinol (ZYLOPRIM) 100 MG tablet take 2 tablets by mouth once daily 60 tablet 1  . amLODipine-benazepril (LOTREL) 5-20 MG capsule     . amLODipine-olmesartan (AZOR) 5-40 MG tablet     . aspirin EC 81 MG tablet Take 81 mg by mouth daily.    Marland Kitchen atorvastatin (LIPITOR) 20 MG tablet Take 20 mg by mouth daily.    . Calcium Carbonate-Vitamin D 600-400 MG-UNIT tablet Take by mouth.    . chlorambucil  (LEUKERAN) 2 MG tablet Take 3 tablets (6 mg total) by mouth daily. Give on an empty stomach 1 hour before or 2 hours after meals. 21 tablet 0  . colchicine 0.6 MG tablet Take 0.6 mg by mouth as needed.  0  . etodolac (LODINE) 200 MG capsule Take 1 capsule (200 mg total) by mouth 2 (two) times daily. 10 capsule 0  . ferrous sulfate 325 (65 FE) MG EC tablet Take 1 tablet by mouth a day with orange juice or vitamin C 30 tablet 1  . folic acid (FOLVITE) 1 MG tablet take 1 tablet by mouth once daily 30 tablet 1  . gabapentin (NEURONTIN) 300 MG capsule     . hydrALAZINE (APRESOLINE) 25 MG tablet     . hydrOXYzine (ATARAX/VISTARIL) 25 MG tablet Take 25 mg by mouth 3 (three) times daily as needed.    Marland Kitchen ibuprofen (ADVIL,MOTRIN) 600 MG tablet Take 600 mg by mouth every 6 (six) hours as needed.     . IMBRUVICA 420 MG TABS TAKE 420 MG BY MOUTH DAILY. 28 tablet 2  . lisinopril-hydrochlorothiazide (PRINZIDE,ZESTORETIC) 20-12.5 MG per tablet Take 1 tablet by mouth daily.    . metoprolol tartrate (LOPRESSOR) 25 MG tablet Take 25 mg by mouth 2 (two) times daily.  0  . nystatin cream (MYCOSTATIN) Apply topically.    Marland Kitchen omeprazole (PRILOSEC) 20 MG capsule Take 20  mg by mouth daily.  0  . polyethylene glycol (MIRALAX / GLYCOLAX) packet Take 17 g by mouth daily as needed for mild constipation. 14 each 1  . potassium chloride (K-DUR) 10 MEQ tablet take 1 tablet by mouth once daily 30 tablet 0   No current facility-administered medications for this visit.    Facility-Administered Medications Ordered in Other Visits  Medication Dose Route Frequency Provider Last Rate Last Dose  . cyanocobalamin ((VITAMIN B-12)) injection 1,000 mcg  1,000 mcg Intramuscular Once Lequita Asal, MD        Review of Systems:  GENERAL: Feels "fine". No problems.  No fevers or sweats. Weight up 9 pounds. PERFORMANCE STATUS (ECOG): 2 HEENT: No runny nose, sore throat, mouth sores or tenderness. Lungs:  No shortness of breath or  cough.  No wheezing.  No hemoptysis. Cardiac: No chest pain, palpitations, orthopnea, or PND.  Blood pressure issues. GI: No nausea, vomiting, diarrhea, constipation, melena or hematochezia. GU: No urgency, frequency, dysuria or hematuria. Musculoskeletal: No back pain. No joint pain. No muscle tenderness. Extremities: No pain or swelling. Skin: No skin nodules. No rashes. Neuro: No headache, numbness or weakness, balance or coordination issues. Endocrine: No diabetes, thyroid issues, hot flashes or night sweats. Psych:No mood changes, depression or anxiety. Pain: No pain. Review of systems: All other systems reviewed and found to be negative.  Physical Exam:  There were no vitals taken for this visit. GENERAL: Elderly woman sitting comfortably in the exam room in clinic in no acute distress. MENTAL STATUS: Alert and oriented to person, place and time. HEAD: Wearing a red hat.  Curly black wig with white highlights. Normocephalic, atraumatic, face symmetric, no Cushingoid features. EYES: Brown eyes. Pupils equal round and reactive to light and accomodation. No conjunctivitis or scleral icterus. ENT: Oropharynx clear without lesion. Tongue normal. Mucous membranes moist.  RESPIRATORY: Normal respiratory excursion. No rales, wheezes or rhonchi. CARDIOVASCULAR: Regular rate and rhythm without murmur, rub or gallop. ABDOMEN: Soft, non-tender with active bowel sounds and no appreciable hepatosplenomegaly. No guarding or rebound tenderness.  SKIN: Red and white painted nails.  No palpable nodules in subcutaneous tissues.  EXTREMITIES: No edema, no skin discoloration or tenderness. No palpable cords. LYMPH NODES: No palpable cervical, supraclavicular, axillary or inguinal adenopathy  NEUROLOGICAL: Unremarkable. PSYCH: Appropriate   No visits with results within 3 Day(s) from this visit.  Latest known visit with results is:  Appointment on 09/29/2017   Component Date Value Ref Range Status  . Ferritin 09/29/2017 29  11 - 307 ng/mL Final   Performed at [facility], [address] [place], [place] [address]  . IgM (Immunoglobulin M), Srm 09/29/2017 1,890* 26 - 217 mg/dL Final   Comment: (NOTE) Results confirmed on dilution. Performed At: [facility] [place], [place] [id number] Rush Farmer MD ML:[id number] Performed at [facility], [address] [place], [place] [address]   . Iron 09/29/2017 51  28 - 170 ug/dL Final  . TIBC 09/29/2017 335  250 - 450 ug/dL Final  . Saturation Ratios 09/29/2017 15  10.4 - 31.8 % Final  . UIBC 09/29/2017 284  ug/dL Final   Performed at [facility], [address], [place],  [address]  . Total Protein ELP 09/29/2017 6.9  6.0 - 8.5 g/dL Final  . Albumin ELP 09/29/2017 3.1  2.9 - 4.4 g/dL Final  . Alpha-1-Globulin 09/29/2017 0.3  0.0 - 0.4 g/dL Final  . Alpha-2-Globulin 09/29/2017 0.7  0.4 -  1.0 g/dL Final  . Beta Globulin 09/29/2017 0.9  0.7 - 1.3 g/dL Final  . Gamma Globulin 09/29/2017 1.9* 0.4 - 1.8 g/dL Final  . M-Spike, % 09/29/2017 1.1* Not Observed g/dL Final  . SPE Interp. 09/29/2017 Comment   Final   Comment: (NOTE) The SPE pattern demonstrates a single peak (M-spike) in the gamma region which may represent monoclonal protein. This peak may also be caused by circulating immune complexes, cryoglobulins, C-reactive protein, fibrinogen or hemolysis.  If clinically indicated, the presence of a monoclonal gammopathy may be confirmed by immuno- fixation, as well as an evaluation of the urine for the presence of Bence-Jones protein. Performed At: [facility] [place], [place] [id number] Rush Farmer MD EM:[id number]   . Comment 09/29/2017 Comment   Final   Comment: (NOTE) Protein electrophoresis scan will follow via computer, mail, or courier delivery.   Marland Kitchen GLOBULIN, TOTAL 09/29/2017 3.8  2.2 - 3.9 g/dL  Corrected  . A/G Ratio 09/29/2017 0.8  0.7 - 1.7 Corrected   Performed at [facility], [address] [place], [place] [address]  . WBC 09/29/2017 4.0  3.6 - 11.0 K/uL Final  . RBC 09/29/2017 5.46* 3.80 - 5.20 MIL/uL Final  . Hemoglobin 09/29/2017 14.2  12.0 - 16.0 g/dL Final  . HCT 09/29/2017 44.2  35.0 - 47.0 % Final  . MCV 09/29/2017 81.0  80.0 - 100.0 fL Final  . MCH 09/29/2017 26.0  26.0 - 34.0 pg Final  . MCHC 09/29/2017 32.1  32.0 - 36.0 g/dL Final  . RDW 09/29/2017 16.2* 11.5 - 14.5 % Final  . Platelets 09/29/2017 84* 150 - 440 K/uL Final  . Neutrophils Relative % 09/29/2017 54  % Final  . Neutro Abs 09/29/2017 2.2  1.4 - 6.5 K/uL Final  . Lymphocytes Relative 09/29/2017 28  % Final  . Lymphs Abs 09/29/2017 1.1  1.0 - 3.6 K/uL Final  . Monocytes Relative 09/29/2017 15  % Final  . Monocytes Absolute 09/29/2017 0.6  0.2 - 0.9 K/uL Final  . Eosinophils Relative 09/29/2017 2  % Final  . Eosinophils Absolute 09/29/2017 0.1  0 - 0.7 K/uL Final  . Basophils Relative 09/29/2017 1  % Final  . Basophils Absolute 09/29/2017 0.0  0 - 0.1 K/uL Final   Performed at [facility], [address], [place], [place] [address]  . Sodium 09/29/2017 139  135 - 145 mmol/L Final  . Potassium 09/29/2017 4.2  3.5 - 5.1 mmol/L Final  . Chloride 09/29/2017 104  101 - 111 mmol/L Final  . CO2 09/29/2017 27  22 - 32 mmol/L Final  . Glucose, Bld 09/29/2017 102* 65 - 99 mg/dL Final  . BUN 09/29/2017 22* 6 - 20 mg/dL Final  . Creatinine, Ser 09/29/2017 1.28* 0.44 - 1.00 mg/dL Final  . Calcium 09/29/2017 8.7* 8.9 - 10.3 mg/dL Final  . Total Protein 09/29/2017 7.4  6.5 - 8.1 g/dL Final  . Albumin 09/29/2017 3.3* 3.5 - 5.0 g/dL Final  . AST 09/29/2017 19  15 - 41 U/L Final  . ALT 09/29/2017 15  14 - 54 U/L Final  . Alkaline Phosphatase 09/29/2017 45  38 - 126 U/L Final  . Total Bilirubin 09/29/2017 0.8  0.3 - 1.2 mg/dL Final  . GFR calc non Af Amer 09/29/2017 37* >60 mL/min Final  . GFR calc  Af Amer 09/29/2017 43* >60 mL/min Final   Comment: (NOTE) The eGFR has been calculated using the CKD EPI equation. This calculation  has not been validated in all clinical situations. eGFR's persistently <60 mL/min signify possible Chronic Kidney Disease.   Georgiann Hahn gap 09/29/2017 8  5 - 15 Final   Performed at [facility], [address] [place], [place] [address]    Assessment:  ELNORA QUIZON is a 84 y.o. 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 She presented with massive subcutaneous deposits, pulmonary nodules, involvement of left kidney, and bowel. Disease was unresponsive to Rituxan in 2014.   Chest, abdomen, and pelvic CT scan on 03/17/2016 revealed progression of diffuse lymphoma with subcutaneous deposits throughout the subcutaneous fat of the chest, abdomen,and pelvis. There were multiple nodular infiltrates throughout the lungs, likely due to lymphoma but could also represent infection or atypical infection. There was massive retroperitoneal lymphadenopathy with diffuse retroperitoneal, mesenteric, and pelvic lymphadenopathy. There was wall thickening and pelvicsmall bowel probably representing lymphomas involvement. There was probable direct invasion of the left kidney.  Biopsy of the dominant SQ nodal conglomeration around the right flank on 03/18/2016 revealed a persistent low-grade CD5 positive B-cell lymphoma.  Flow cytometry was positive for CD20, CD79a, CD5, CD138. Neoplastic cells were positive for kappa and cyclin D1.  CD 23 was negative.   Additional molecular markers are pending.  Bone marrow aspirate and biopsy on 03/18/2016 revealed no evidence of lymphoma or plasma cell neoplasm (0.2% kappa monoclonal plasma cells by flow analysis).  Marrow was hypercellular for age (50-60%) with trilineage hematopoiesis and no increased blasts. There was mild to moderate widespread increase in reticulin fibers. There was no storage  iron detected.  The following studies were abnormal:  Beta2-microglobulin was 9.6 (0.6-2.4).  Serum viscosity was 5.5 (1.6-1.9).  Uric acid was 11.1 on 03/19/2016 and 5.7 on 05/23/2016.     Normal studies included:  hepatitis B surface antigen, hepatitis B core antibody total, hepatitis C antibody, HIV testing, and LDH (123).  G6PD assay was 9.2 (4.6-13.5).   She has iron deficiency (ferritin 27; iron saturation 11%), B12 deficiency (147), and folate deficiency (5.9).  She has chronic renal insufficiency (BUN was 30-35, creatinine was 1.38 - 1.80 with a GFR 29-39 ml/min).  She receives B12 (began 03/28/2016; last 11/30/2017).  She is on folic acid.  Folate was 77.5 on 05/09/2016 and 42 on 03/13/2017.   She received 1 week of chlorambucil with a tapering dose of prednisone (began 03/30/2016).  Cortisol was 9.5 on 05/17/2016.  She began ibrutinib on 04/26/2016.  She has mild thrombocytopenia (94,000).   SPEP has been followed:  5.0 gm/dL on 03/18/2016, 4.5 on 04/25/2016,  2.3 on 06/20/2016, 2.0 on 10/24/2016, 1.5 on 12/19/2016, 1.4 on 03/13/2017, 1.1 on 07/01/2017, and 1.1 on 09/29/2017 .  IgM has been followed:  > 5850 on 03/18/2016, > 5850 on 04/25/2016, 4652 on 06/09/2016, 3719 on 07/18/2016, 3429 on 10/24/2016, 2821 on 12/19/2016, 2295 on 03/13/2017, 2500 on 06/30/2017, and 1890 09/29/2017.  Chest, abdomen, and pelvic CT scan on 04/21/2016 revealed a partial response to therapy.  There were bilateral pulmonary nodules/masses, measuring up to 5.6 cm in the right lower lobe (improved).  There was trace left pleural effusion.  There was a 12.7 cm left renal/perirenal mass (decreased) with associated mild left hydronephrosis.  Retroperitoneal/left pelvic lymphadenopathy was mildly decreased.  Multifocal subcutaneous nodules, measuring up to 4.5 cm in the left lower anterior abdominal wall were stable to mildly improved.  She denies any B symptoms. Exam is unremarkable.  WBC 4000 with ANC of 2200.  Hemoglobin is 14.2, hematocrit 44.2, and  platelets 84,000.  Plan: 1.  Labs today:  CBC with diff, CMP, SPEP, IgM, uric acid, folate.  2.  Continue Ibrutinib 420 mg a day. 3.  Follow-up today with oral chemo pharmacist for specialty pharmacy routine follow up.  4.  Discuss plans for follow-up imaging (chest, abdomen, and pelvic CT scan).  5.  B12 injection today. 5.  RTC monthly x 2 for B12 injections 6.  RTC in 6 weeks for labs (CBC with diff); schedule on same day as B12 injection. 7.  RTC in 3 months for MD assessment, labs (CBC with diff, CMP, SPEP, IgM), and B12. 8.  Arrange for Lucianne Lei to transport patient. She cites that her missed appointments have been because she has transportation issues.    Lequita Asal, MD  12/28/2017, 5:15 AM   I saw and evaluated the patient, participating in the key portions of the service and reviewing pertinent diagnostic studies and records.  I reviewed the nurse practitioner's note and agree with the findings and the plan.  The assessment and plan were discussed with the patient.  A few questions were asked by the patient and answered.   Lequita Asal, MD 12/28/2017, 5:15 AM

## 2017-12-28 NOTE — ED Notes (Signed)
Per Dr. Rosana Hoes and EDP, no NG tube at this time.

## 2017-12-28 NOTE — Consult Note (Signed)
SURGICAL CONSULTATION NOTE (initial) - cpt: [id number]  HISTORY OF PRESENT ILLNESS (HPI):  84 y.o. female presented to [facility] ED today for evaluation of diarrhea with nausea and emesis. Patient reports she was feeling well when she went to sleep last night until she shortly after midnight began experiencing "upset stomach", followed by several episodes of non-bloody diarrhea and subsequent nausea and non-bloody non-bilious emesis, after which she said her abdomen felt "a little sore", but she specifically denies any abdominal pain or distention. She states that her last loose BM was ~5 pm this evening, and during this time she has continued to also pass flatus. Patient's last emesis was prior to ED presentation. She otherwise denies any prior bowel obstructions (despite prior hysterectomy), fever/chills, CP, or SOB. Of note, patient has been taking once daily oral ibrutinib for lymphoma, first diagnosed ~2015.  Surgery is consulted by ED physician Dr. Quentin Cornwall in this context for evaluation and management of possible partial SBO.  PAST MEDICAL HISTORY (PMH):  Past Medical History:  Diagnosis Date  . 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 Hyperlipidemia   . Hypertension   . 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 Renal insufficiency      PAST SURGICAL HISTORY ([place]):  Past Surgical History:  Procedure Laterality Date  . ABDOMINAL HYSTERECTOMY    . HEMORRHOID SURGERY       MEDICATIONS:  Prior to Admission medications   Medication Sig Start Date End Date Taking? Authorizing Provider  allopurinol (ZYLOPRIM) 100 MG tablet take 2 tablets by mouth once daily 11/09/16  Yes Corcoran, Melissa C, MD  amLODipine-benazepril (LOTREL) 5-20 MG capsule Take 1 capsule by mouth daily.    Yes [provider]  atorvastatin (LIPITOR) 20 MG tablet Take 20 mg by mouth daily. 12/24/13  Yes [provider]  colchicine 0.6 MG tablet Take 0.6 mg by mouth as needed. For gout   Yes  [provider]  furosemide (LASIX) 20 MG tablet Take 20 mg by mouth daily.   Yes [provider]  gabapentin (NEURONTIN) 300 MG capsule Take 300 mg by mouth 3 (three) times daily.    Yes [provider]  hydrALAZINE (APRESOLINE) 25 MG tablet Take 25 mg by mouth 3 (three) times daily.    Yes [provider]  IMBRUVICA 420 MG TABS TAKE 420 MG BY MOUTH DAILY. 12/22/17  Yes Karen Kitchens, NP  metoprolol tartrate (LOPRESSOR) 25 MG tablet Take 25 mg by mouth 2 (two) times daily. 03/05/16  Yes [provider]  omeprazole (PRILOSEC) 20 MG capsule Take 20 mg by mouth daily. 03/02/16  Yes [provider]     ALLERGIES:  No Known Allergies   SOCIAL HISTORY:  Social History   Socioeconomic History  . Marital status: Widowed    Spouse name: Not on file  . Number of children: Not on file  . Years of education: Not on file  . Highest education level: Not on file  Occupational History  . Not on file  Social Needs  . Financial resource strain: Not on file  . Food insecurity:    Worry: Not on file    Inability: Not on file  . Transportation needs:    Medical: Not on file    Non-medical: Not on file  Tobacco Use  . Smoking status: Never Smoker  . Smokeless tobacco: Never Used  Substance and Sexual Activity  . Alcohol use: No  . Drug use: No  . Sexual activity: Not on file  Lifestyle  . Physical activity:    Days per week: Not on file    Minutes per session: Not on file  . Stress: Not on file  Relationships  . Social connections:    Talks on phone: Not on file    Gets together: Not on file    Attends religious service: Not on file    Active member of club or organization: Not on file    Attends meetings of clubs or organizations: Not on file    Relationship status: Not on file  . Intimate partner violence:    Fear of current or ex partner: Not on file    Emotionally abused: Not on file    Physically abused: Not on file    Forced  sexual activity: Not on file  Other Topics Concern  . Not on file  Social History Narrative  . Not on file    The patient currently resides (home / rehab facility / nursing home): Home The patient normally is (ambulatory / bedbound): Ambulatory   FAMILY HISTORY:  Family History  Problem Relation Age of Onset  . Cancer Brother        throat ca  . Cancer Brother        bone cancer     REVIEW OF SYSTEMS:  Constitutional: denies weight loss, fever, chills, or sweats  Eyes: denies any other vision changes, history of eye injury  ENT: denies sore throat, hearing problems  Respiratory: denies shortness of breath, wheezing  Cardiovascular: denies chest pain, palpitations  Gastrointestinal: abdominal pain, N/V, and bowel function as per HPI Genitourinary: denies burning with urination or urinary frequency Musculoskeletal: denies any other joint pains or cramps  Skin: denies any other rashes or skin discolorations  Neurological: denies any other headache, dizziness, weakness  Psychiatric: denies any other depression, anxiety   All other review of systems were negative   VITAL SIGNS:  Temp:  [99.5 F (37.5 C)] 99.5 F (37.5 C) (03/28 1604) Pulse Rate:  [61-81] 65 (03/28 2027) Resp:  [16-28] 21 (03/28 2027) BP: (73-139)/(45-59) 82/57 (03/28 2027) SpO2:  [94 %-100 %] 94 % (03/28 2027) Weight:  [190 lb (86.2 kg)] 190 lb (86.2 kg) (03/28 1605)     Height: 5\' 3"  (160 cm) Weight: 190 lb (86.2 kg) BMI (Calculated): 33.67   INTAKE/OUTPUT:  This shift: Total I/O In: 500 [IV Piggyback:500] Out: -   Last 2 shifts: @IOLAST2SHIFTS @   PHYSICAL EXAM:  Constitutional:  -- Overweight body habitus  -- Awake, alert, and oriented x3, no apparent distress Eyes:  -- Pupils equally round and reactive to light  -- No scleral icterus, B/L no occular discharge Ear, nose, throat: -- Neck is FROM WNL -- No jugular venous distension  Pulmonary:  -- No wheezes or rhales -- Equal breath sounds  bilaterally -- Breathing non-labored at rest Cardiovascular:  -- S1, S2 present  -- No pericardial rubs  Gastrointestinal:  -- Abdomen soft and overweight but non-distended with mild Right-side abdominal tenderness to palpation, no guarding or rebound tenderness -- No abdominal masses appreciated, pulsatile or otherwise  Musculoskeletal and Integumentary:  -- Wounds or skin discoloration: None appreciated -- Extremities: B/L UE and LE FROM, hands and feet warm, no edema  Neurologic:  -- Motor function: Intact and symmetric -- Sensation: Intact and symmetric Psychiatric:  -- Mood and affect WNL  Labs:  CBC Latest Ref Rng & Units 12/28/2017 09/29/2017 07/31/2017  WBC 3.6 - 11.0 K/uL 11.5(H) 4.0 3.1(L)  Hemoglobin 12.0 -  16.0 g/dL 15.0 14.2 13.6  Hematocrit 35.0 - 47.0 % 47.7(H) 44.2 42.1  Platelets 150 - 440 K/uL 102(L) 84(L) 87(L)   CMP Latest Ref Rng & Units 12/28/2017 09/29/2017 06/30/2017  Glucose 65 - 99 mg/dL 132(H) 102(H) 105(H)  BUN 6 - 20 mg/dL 33(H) 22(H) 23(H)  Creatinine 0.44 - 1.00 mg/dL 1.41(H) 1.28(H) 1.26(H)  Sodium 135 - 145 mmol/L 141 139 137  Potassium 3.5 - 5.1 mmol/L 4.3 4.2 3.9  Chloride 101 - 111 mmol/L 106 104 106  CO2 22 - 32 mmol/L 23 27 25   Calcium 8.9 - 10.3 mg/dL 8.5(L) 8.7(L) 8.8(L)  Total Protein 6.5 - 8.1 g/dL 7.8 7.4 7.1  Total Bilirubin 0.3 - 1.2 mg/dL 0.8 0.8 0.6  Alkaline Phos 38 - 126 U/L 47 45 46  AST 15 - 41 U/L 33 19 20  ALT 14 - 54 U/L 23 15 21    Imaging studies:  CT Abdomen and Pelvis with Contrast (12/28/2017) - personally reviewed and discussed with patient, her grandson, and ED physician 1. There are multiple loops of mildly dilated small bowel. There is a transition zone in the distal ileal region, indicative of a degree of bowel obstruction. No free air or portal venous air. No mass is associated with bowel currently by CT.  2. Areas felt to represent lymphoma again noted. There is a mass which appears to arise from the left  kidney which is considerably smaller than on the previous study, likely due to renal involvement of lymphoma in this area. There are left perirenal lymph nodes, likely due to lymphoma, smaller compared to previous study. There is a right external iliac artery node chain enlarged lymph node, likely due to lymphoma. Subcutaneous deposits bilaterally are considerably smaller compared to 2017 but felt to represent residual foci of lymphoma.  3. Sizable calculi in each kidney without obstruction.  No hydronephrosis. No ureteral calculus.  4. Marked aortoiliac atherosclerosis. Multiple mesenteric arterial  vascular calcifications. No frank major mesenteric vascular obstruction.  Foci of coronary artery calcification noted.  5. Bibasilar pulmonary fibrosis.  Question pneumonia versus scarring left base. 6.  Uterus absent. 7.  Hiatal hernia present.   Assessment/Plan: (ICD-10's: K11.9) 84 y.o. female with what seems clinically much more compatible with gastroenteritis, despite radiology interpretation of CT, though less likely partial SBO cannot entirely be excluded, complicated by AKI, mild hypotension with mild tachycardia (all consistent with post-diarrheal and post-emesis acute hypovolemia), and by pertinent comorbidities including lymphoma (which on today's CT imaging appears to be controlled, even somewhat improved, with ongoing oral therapy), HTN, HLD, CKD, and chronic anemia.   - volume resuscitation/rehydration  - okay with clear liquids diet and advance as tolerated  - currently no indication for surgical intervention or NG tube  - continue to monitor abdominal exam and bowel function  - medical management of comorbidities per medical team  - DVT prophylaxis, ambulation encouraged  All of the above findings and recommendations were discussed with the patient and her grandson, and all of patient's and her family's questions were answered to their expressed satisfaction.  Thank  you for the opportunity to participate in this patient's care.   -- Marilynne Drivers Rosana Hoes, MD, [place]: [facility] - Partnering for exceptional care. Office: [phone]

## 2017-12-28 NOTE — ED Triage Notes (Signed)
Pt arrives POV with family. Pt lives by self. Daughter in law states this AM around 2:30 pt began vomiting. Pt states "it's coming out both ends." denies fever at home. Pt slumped over in triage, states weakness. States chills. States achy "all over: unsure if exposed to sick person.

## 2017-12-29 ENCOUNTER — Observation Stay: Payer: Medicare HMO

## 2017-12-29 ENCOUNTER — Other Ambulatory Visit: Payer: Self-pay

## 2017-12-29 DIAGNOSIS — K529 Noninfective gastroenteritis and colitis, unspecified: Secondary | ICD-10-CM | POA: Diagnosis not present

## 2017-12-29 LAB — CBC
HCT: 40.5 % (ref 35.0–47.0)
HEMOGLOBIN: 12.7 g/dL (ref 12.0–16.0)
MCH: 25.3 pg — AB (ref 26.0–34.0)
MCHC: 31.3 g/dL — ABNORMAL LOW (ref 32.0–36.0)
MCV: 80.8 fL (ref 80.0–100.0)
PLATELETS: 74 10*3/uL — AB (ref 150–440)
RBC: 5.02 MIL/uL (ref 3.80–5.20)
RDW: 16.4 % — ABNORMAL HIGH (ref 11.5–14.5)
WBC: 5.3 10*3/uL (ref 3.6–11.0)

## 2017-12-29 LAB — BASIC METABOLIC PANEL
Anion gap: 9 (ref 5–15)
BUN: 36 mg/dL — ABNORMAL HIGH (ref 6–20)
CALCIUM: 7.6 mg/dL — AB (ref 8.9–10.3)
CO2: 23 mmol/L (ref 22–32)
CREATININE: 1.57 mg/dL — AB (ref 0.44–1.00)
Chloride: 108 mmol/L (ref 101–111)
GFR calc Af Amer: 33 mL/min — ABNORMAL LOW (ref >60)
GFR calc non Af Amer: 29 mL/min — ABNORMAL LOW (ref >60)
GLUCOSE: 105 mg/dL — AB (ref 65–99)
Potassium: 3.9 mmol/L (ref 3.5–5.1)
Sodium: 140 mmol/L (ref 135–145)

## 2017-12-29 LAB — MAGNESIUM: MAGNESIUM: 1.8 mg/dL (ref 1.7–2.4)

## 2017-12-29 MED ORDER — IBRUTINIB 420 MG PO TABS: 420.0000 mg | ORAL_TABLET | Freq: Every day | ORAL | Status: DC

## 2017-12-29 MED ORDER — LOPERAMIDE HCL 2 MG PO TABS: 2.0000 mg | ORAL_TABLET | Freq: Four times a day (QID) | ORAL | 0 refills | Status: DC | PRN

## 2017-12-29 NOTE — Discharge Summary (Signed)
[facility] [place] at [facility], 84 y.o., DOB Aug 09, 1929, MRN [id number]. Admission date: 12/28/2017 Discharge Date 12/29/2017 Primary MD Jodi Marble, MD Admitting Physician Demetrios Loll, MD  Admission Diagnosis  Gastroenteritis [K52.9]  Discharge Diagnosis   Active Problems:   Acute gastroenteritis with ileus Hypotension due to dehydration Acute renal failure due to dehydration and diarrhea Essential hypertension History lymphoma Anemia Hyperlipidemia   Hospital Course Patient is 84-year African-American female who presented with nausea vomiting and diarrhea.  She underwent evaluation in the ED including a CT scan which showed a possible small bowel obstruction.  She was seen by surgery and they did not recommend any intervention.  She was provided supportive care IV fluids.  This morning her symptoms have resolved.  Abdominal x-ray shows resolution of small bowel obstruction.  Her diet has been advanced.  She will be stable to be discharged home if she tolerates her diet.            Consults  general surgery  Significant Tests:  See full reports for all details     Ct Abdomen Pelvis W Contrast  Result Date: 12/28/2017 CLINICAL DATA:  Vomiting and diarrhea. History of lymphoplasmacytic lymphoma EXAM: CT ABDOMEN AND PELVIS WITH CONTRAST TECHNIQUE: Multidetector CT imaging of the abdomen and pelvis was performed using the standard protocol following bolus administration of intravenous contrast. CONTRAST:  63mL ISOVUE-300 IOPAMIDOL (ISOVUE-300) INJECTION 61% COMPARISON:  March 17, 2016 and April 21, 2016 CT abdomen and pelvis examinations. FINDINGS: Lower chest: There are areas of fibrosis in each lung base. There is chronic consolidation in the left base laterally, similar in appearance to prior study. A small focus of pneumonia in this area cannot be excluded. There is a hiatal hernia. There are foci of coronary artery calcification.  Hepatobiliary: There is a 7 mm cyst in the dome of the liver on the right. There is a tiny granuloma in the anterior dome of the liver. No other liver lesions are evident. Gallbladder wall is not appreciably thickened. There is no biliary duct dilatation. Pancreas: No pancreatic mass or inflammatory focus. Spleen: Spleen is normal in size and contour. No splenic lesions are evident. Adrenals/Urinary Tract: The right adrenal appears normal. There is generalized hypertrophy of the left adrenal. There remains mass with in the left kidney extending into the left renal vein region. Currently, the area of mass in the left kidney measures 6.1 x 5.0 cm. This mass measured 13.7 x 10.7 cm 20 months prior. 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 There is no new renal mass on either side. There remain multiple calculi in the right kidney. There is a calculus in the right mid kidney posteriorly measuring 2.0 x 1.7 cm. There is a calculus extending through the mid kidney into the renal pelvis to the level of the ureteropelvic junction measuring 2.6 x 2.0 cm. Several smaller calculi are noted in the right kidney is well. There is calcification along the periphery of a cystic area in the lateral mid right kidney measuring 2.0 x 2.1 cm, stable. There are multiple similar appearing calcifications on the left, largest measuring 1.4 x 1.3 cm. There are cystic areas in the left kidney, largest measuring approximately 2.2 x 2.2 cm. There is no appreciable hydronephrosis on either side. No ureteral calculi are evident. Urinary bladder is midline with wall thickness within normal limits. Stomach/Bowel: There are multiple loops of  mildly dilated small bowel. There is a transition zone in the distal ileal region, indicative of a degree of bowel obstruction. No free air or portal venous air. No mass is associated with bowel currently by CT. Vascular/Lymphatic: There is  atherosclerotic calcification throughout the aorta and iliac arteries. There is extensive mesenteric arterial calcification without mesenteric arterial obstruction. In the left kidney region, there are multiple enlarged lymph nodes which are difficult to separate from the mass which appears to arise from the left kidney. Suspect combination of mass and adenopathy due to lymphoma in the left adrenal. This area is much less pronounced than on prior study. There is a focal lymph node in the right external iliac node chain measuring 1.7 x 1.2 cm. There has been considerable overall resolution of adenopathy compared to prior study from 2017. Reproductive: Uterus absent.  No pelvic mass appreciable. Other: There are scattered nodular lesions throughout the abdominal wall, greatly diminished compared to previous study consistent with considerable resolution of foci of lymphoma in the abdominal wall. No abscess or ascites is evident in the abdomen or pelvis. No periappendiceal region inflammation evident. Musculoskeletal: There is degenerative change in the lumbar spine. No blastic or lytic bone lesions. No intramuscular lesions are evident. IMPRESSION: 1. Small bowel obstruction with transition zone at distal ileal level. No bowel region mass evident on this study. 2. Areas felt to represent lymphoma again noted. There is a mass which appears to arise from the left kidney which is considerably smaller than on the previous study, likely due to renal involvement of lymphoma in this area. There are left perirenal lymph nodes, likely due to lymphoma, smaller compared to previous study. There is a right external iliac artery node chain enlarged lymph node, likely due to lymphoma. Subcutaneous deposits bilaterally are considerably smaller compared to 2017 but felt to represent residual foci of lymphoma. 3. Sizable calculi in each kidney without obstruction. No hydronephrosis. No ureteral calculus. 4. Marked aortoiliac  atherosclerosis. Multiple mesenteric arterial vascular calcifications. No frank major mesenteric vascular obstruction. Foci of coronary artery calcification noted. 5. Bibasilar pulmonary fibrosis. Question pneumonia versus scarring left base. 6.  Uterus absent. 7.  Hiatal hernia present. Aortic Atherosclerosis (ICD10-I70.0). Electronically Signed   By: Lowella Grip III M.D.   On: 12/28/2017 18:25   Dg Chest Port 1 View  Result Date: 12/28/2017 CLINICAL DATA:  Vomiting, weakness, chills, history of lymphoplasmacytic lymphoma EXAM: PORTABLE CHEST 1 VIEW COMPARISON:  CT chest of 04/21/2016 and portable chest x-ray of 03/17/2016 FINDINGS: The lung metastasis is described on prior CT the chest are not well seen on today's chest x-ray. There is haziness at the left lung base some of which is due to overlapping breast tissue. However effusion cannot be excluded. Mediastinal and hilar contours are stable and moderate cardiomegaly is stable. No acute bony abnormality is seen. IMPRESSION: 1. The previously demonstrated lung metastasis are not well seen by chest x-ray. 2. Stable moderate cardiomegaly. Difficult to assess the left lung base. Electronically Signed   By: Ivar Drape M.D.   On: 12/28/2017 16:58   Dg Abd 2 Views  Result Date: 12/29/2017 CLINICAL DATA:  Pain of unknown etiology.  Small-bowel obstruction EXAM: ABDOMEN - 2 VIEW COMPARISON:  CT 12/28/2017. Abdominal series 3 scratched it abdominal series 03/17/2017. FINDINGS: Interim near complete resolution of distention of small bowel. Air noted in the colon. No free air. Bilateral nephrolithiasis again noted. Aortic atherosclerotic vascular disease. Degenerative changes osteopenia lumbar spine. Contrast noted in the bladder  from prior recent CT. Bibasilar atelectasis/infiltrates. IMPRESSION: 1.  Interim near complete resolution of small bowel distention. 2.  Bilateral nephrolithiasis again noted. 3.  Bibasilar atelectasis/infiltrates. Electronically  Signed   By: Marcello Moores  Register   On: 12/29/2017 09:36       Today   Subjective:   Kristy Solomon feeling better nausea vomiting resolved o Objective:   Blood pressure (!) 166/64, pulse 80, temperature 98 F (36.7 C), temperature source Oral, resp. rate 16, height 5\' 3"  (1.6 m), weight 190 lb (86.2 kg), SpO2 100 %.  .  Intake/Output Summary (Last 24 hours) at 12/29/2017 1429 Last data filed at 12/29/2017 1422 Gross per 24 hour  Intake 1697 ml  Output 225 ml  Net 1472 ml    Exam VITAL SIGNS: Blood pressure (!) 166/64, pulse 80, temperature 98 F (36.7 C), temperature source Oral, resp. rate 16, height 5\' 3"  (1.6 m), weight 190 lb (86.2 kg), SpO2 100 %.  GENERAL:  84 y.o.-year-old patient lying in the bed with no acute distress.  EYES: Pupils equal, round, reactive to light and accommodation. No scleral icterus. Extraocular muscles intact.  HEENT: Head atraumatic, normocephalic. Oropharynx and nasopharynx clear.  NECK:  Supple, no jugular venous distention. No thyroid enlargement, no tenderness.  LUNGS: Normal breath sounds bilaterally, no wheezing, rales,rhonchi or crepitation. No use of accessory muscles of respiration.  CARDIOVASCULAR: S1, S2 normal. No murmurs, rubs, or gallops.  ABDOMEN: Soft, nontender, nondistended. Bowel sounds present. No organomegaly or mass.  EXTREMITIES: No pedal edema, cyanosis, or clubbing.  NEUROLOGIC: Cranial nerves II through XII are intact. Muscle strength 5/5 in all extremities. Sensation intact. Gait not checked.  PSYCHIATRIC: The patient is alert and oriented x 3.  SKIN: No obvious rash, lesion, or ulcer.   Data Review     CBC w Diff:  Lab Results  Component Value Date   WBC 5.3 12/29/2017   HGB 12.7 12/29/2017   HGB 9.5 (L) 03/19/2016   HGB 13.3 07/04/2014   HCT 40.5 12/29/2017   HCT 41.8 07/04/2014   PLT 74 (L) 12/29/2017   PLT 128 (L) 07/04/2014   LYMPHOPCT 28 09/29/2017   LYMPHOPCT 18.6 07/04/2014   MONOPCT 15 09/29/2017    MONOPCT 9.8 07/04/2014   EOSPCT 2 09/29/2017   EOSPCT 1.2 07/04/2014   BASOPCT 1 09/29/2017   BASOPCT 1.0 07/04/2014   CMP:  Lab Results  Component Value Date   NA 140 12/29/2017   NA 141 07/04/2014   K 3.9 12/29/2017   K 3.8 07/04/2014   CL 108 12/29/2017   CL 104 04/12/2014   CO2 23 12/29/2017   CO2 25 07/04/2014   BUN 36 (H) 12/29/2017   BUN 30.9 (H) 07/04/2014   CREATININE 1.57 (H) 12/29/2017   CREATININE 1.8 (H) 07/04/2014   PROT 7.8 12/28/2017   PROT 9.7 (H) 07/04/2014   ALBUMIN 3.4 (L) 12/28/2017   ALBUMIN 3.0 (L) 07/04/2014   BILITOT 0.8 12/28/2017   BILITOT 0.36 07/04/2014   ALKPHOS 47 12/28/2017   ALKPHOS 55 07/04/2014   AST 33 12/28/2017   AST 10 07/04/2014   ALT 23 12/28/2017   ALT 10 07/04/2014  .  Micro Results Recent Results (from the past 240 hour(s))  Blood Culture (routine x 2)     Status: None (Preliminary result)   Collection Time: 12/28/17  4:49 PM  Result Value Ref Range Status   Specimen Description BLOOD RIGHT ANTECUBITAL  Final   Special Requests   Final    BOTTLES  DRAWN AEROBIC AND ANAEROBIC Blood Culture adequate volume   Culture   Final    NO GROWTH < 12 HOURS Performed at [facility], [address] [place], [place] [address]    Report Status PENDING  Incomplete  Blood Culture (routine x 2)     Status: None (Preliminary result)   Collection Time: 12/28/17  4:49 PM  Result Value Ref Range Status   Specimen Description BLOOD BLOOD LEFT HAND  Final   Special Requests   Final    BOTTLES DRAWN AEROBIC AND ANAEROBIC Blood Culture results may not be optimal due to an excessive volume of blood received in culture bottles   Culture   Final    NO GROWTH < 12 HOURS Performed at [facility], [address], [place], [place] [address]    Report Status PENDING  Incomplete        Code Status Orders  (From admission, onward)        Start     Ordered   12/28/17 2227  Full code  Continuous     12/28/17 2226     Code Status History    Date Active Date Inactive Code Status Order ID Comments User Context   03/17/2016 0920 03/20/2016 1637 Full Code [id number]  Demetrios Loll, MD Inpatient          Follow-up Information    Jodi Marble, MD. Go on 01/02/2018.   Specialty:  Internal Medicine Why:  Tuesday at 1:15pm for hospital follow-up Contact information: [place] [address] [phone]           Discharge Medications   Allergies as of 12/29/2017   No Known Allergies     Medication List    TAKE these medications   allopurinol 100 MG tablet Commonly known as:  ZYLOPRIM take 2 tablets by mouth once daily   amLODipine-benazepril 5-20 MG capsule Commonly known as:  LOTREL Take 1 capsule by mouth daily.   atorvastatin 20 MG tablet Commonly known as:  LIPITOR Take 20 mg by mouth daily.   colchicine 0.6 MG tablet Take 0.6 mg by mouth as needed. For gout   furosemide 20 MG tablet Commonly known as:  LASIX Take 20 mg by mouth daily.   gabapentin 300 MG capsule Commonly known as:  NEURONTIN Take 300 mg by mouth 3 (three) times daily.   hydrALAZINE 25 MG tablet Commonly known as:  APRESOLINE Take 25 mg by mouth 3 (three) times daily.   IMBRUVICA 420 MG Tabs Generic drug:  Ibrutinib TAKE 420 MG BY MOUTH DAILY.   loperamide 2 MG tablet Commonly known as:  IMODIUM A-D Take 1 tablet (2 mg total) by mouth 4 (four) times daily as needed for diarrhea or loose stools.   metoprolol tartrate 25 MG tablet Commonly known as:  LOPRESSOR Take 25 mg by mouth 2 (two) times daily.   omeprazole 20 MG capsule Commonly known as:  PRILOSEC Take 20 mg by mouth daily.          Total Time in preparing paper work, data evaluation and todays exam - 23 minutes  Dustin Flock M.D on 12/29/2017 at 2:29 PM [place]  [phone]

## 2017-12-29 NOTE — Care Management Obs Status (Signed)
[facility] NOTIFICATION   Patient Details  Name: Kristy Solomon MRN: [id number] Date of Birth: 08/10/1929   Medicare Observation Status Notification Given:  Yes    Jolly Mango, RN 12/29/2017, 11:36 AM

## 2017-12-29 NOTE — Progress Notes (Signed)
CC: Gastroenteritis Subjective: This patient admitted to the hospital with possible gastroenteritis versus small bowel obstruction.  Her films been personally reviewed and considered including her CT scan and her labs.  Of note her history includes treatment for a lymphoma 7 8 or 9 years ago.  She had chemotherapy at that time states it did not work.  She sees an oncologist regularly.  She seems to be surprised that she has a kidney mass.  On CT scan it seems to be enlarged.  She has not been treated with a nasogastric tube but has not had any nausea or vomiting.  She passed gas and had a bowel movement last night but none since.  Denies any abdominal pain.  Objective: Vital signs in last 24 hours: Temp:  [98.2 F (36.8 C)-99.5 F (37.5 C)] 98.2 F (36.8 C) (03/29 0445) Pulse Rate:  [58-81] 63 (03/29 0445) Resp:  [14-28] 18 (03/29 0445) BP: (73-140)/(45-64) 140/52 (03/29 0445) SpO2:  [94 %-100 %] 96 % (03/29 0445) Weight:  [190 lb (86.2 kg)] 190 lb (86.2 kg) (03/28 1605) Last BM Date: 12/28/17  Intake/Output from previous day: 03/28 0701 - 03/29 0700 In: 1217 [P.O.:120; I.V.:597; IV Piggyback:500] Out: 225 [Urine:225] Intake/Output this shift: No intake/output data recorded.  Physical exam:  Awake and alert vital signs are stable Abdomen is soft nondistended nontympanitic and nontender  Lab Results: CBC  Recent Labs    12/28/17 1607 12/29/17 0544  WBC 11.5* 5.3  HGB 15.0 12.7  HCT 47.7* 40.5  PLT 102* 74*   BMET Recent Labs    12/28/17 1607 12/29/17 0544  NA 141 140  K 4.3 3.9  CL 106 108  CO2 23 23  GLUCOSE 132* 105*  BUN 33* 36*  CREATININE 1.41* 1.57*  CALCIUM 8.5* 7.6*   PT/INR No results for input(s): LABPROT, INR in the last 72 hours. ABG No results for input(s): PHART, HCO3 in the last 72 hours.  Invalid input(s): PCO2, PO2  Studies/Results: Ct Abdomen Pelvis W Contrast  Result Date: 12/28/2017 CLINICAL DATA:  Vomiting and diarrhea. History  of lymphoplasmacytic lymphoma EXAM: CT ABDOMEN AND PELVIS WITH CONTRAST TECHNIQUE: Multidetector CT imaging of the abdomen and pelvis was performed using the standard protocol following bolus administration of intravenous contrast. CONTRAST:  43mL ISOVUE-300 IOPAMIDOL (ISOVUE-300) INJECTION 61% COMPARISON:  March 17, 2016 and April 21, 2016 CT abdomen and pelvis examinations. FINDINGS: Lower chest: There are areas of fibrosis in each lung base. There is chronic consolidation in the left base laterally, similar in appearance to prior study. A small focus of pneumonia in this area cannot be excluded. There is a hiatal hernia. There are foci of coronary artery calcification. Hepatobiliary: There is a 7 mm cyst in the dome of the liver on the right. There is a tiny granuloma in the anterior dome of the liver. No other liver lesions are evident. Gallbladder wall is not appreciably thickened. There is no biliary duct dilatation. Pancreas: No pancreatic mass or inflammatory focus. Spleen: Spleen is normal in size and contour. No splenic lesions are evident. Adrenals/Urinary Tract: The right adrenal appears normal. There is generalized hypertrophy of the left adrenal. There remains mass with in the left kidney extending into the left renal vein region. Currently, the area of mass in the left kidney measures 6.1 x 5.0 cm. This mass measured 13.7 x 10.7 cm 20 months prior. 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 There  is no new renal mass on either side. There remain multiple calculi in the right kidney. There is a calculus in the right mid kidney posteriorly measuring 2.0 x 1.7 cm. There is a calculus extending through the mid kidney into the renal pelvis to the level of the ureteropelvic junction measuring 2.6 x 2.0 cm. Several smaller calculi are noted in the right kidney is well. There is calcification along the periphery of a cystic area in the  lateral mid right kidney measuring 2.0 x 2.1 cm, stable. There are multiple similar appearing calcifications on the left, largest measuring 1.4 x 1.3 cm. There are cystic areas in the left kidney, largest measuring approximately 2.2 x 2.2 cm. There is no appreciable hydronephrosis on either side. No ureteral calculi are evident. Urinary bladder is midline with wall thickness within normal limits. Stomach/Bowel: There are multiple loops of mildly dilated small bowel. There is a transition zone in the distal ileal region, indicative of a degree of bowel obstruction. No free air or portal venous air. No mass is associated with bowel currently by CT. Vascular/Lymphatic: There is atherosclerotic calcification throughout the aorta and iliac arteries. There is extensive mesenteric arterial calcification without mesenteric arterial obstruction. In the left kidney region, there are multiple enlarged lymph nodes which are difficult to separate from the mass which appears to arise from the left kidney. Suspect combination of mass and adenopathy due to lymphoma in the left adrenal. This area is much less pronounced than on prior study. There is a focal lymph node in the right external iliac node chain measuring 1.7 x 1.2 cm. There has been considerable overall resolution of adenopathy compared to prior study from 2017. Reproductive: Uterus absent.  No pelvic mass appreciable. Other: There are scattered nodular lesions throughout the abdominal wall, greatly diminished compared to previous study consistent with considerable resolution of foci of lymphoma in the abdominal wall. No abscess or ascites is evident in the abdomen or pelvis. No periappendiceal region inflammation evident. Musculoskeletal: There is degenerative change in the lumbar spine. No blastic or lytic bone lesions. No intramuscular lesions are evident. IMPRESSION: 1. Small bowel obstruction with transition zone at distal ileal level. No bowel region mass evident  on this study. 2. Areas felt to represent lymphoma again noted. There is a mass which appears to arise from the left kidney which is considerably smaller than on the previous study, likely due to renal involvement of lymphoma in this area. There are left perirenal lymph nodes, likely due to lymphoma, smaller compared to previous study. There is a right external iliac artery node chain enlarged lymph node, likely due to lymphoma. Subcutaneous deposits bilaterally are considerably smaller compared to 2017 but felt to represent residual foci of lymphoma. 3. Sizable calculi in each kidney without obstruction. No hydronephrosis. No ureteral calculus. 4. Marked aortoiliac atherosclerosis. Multiple mesenteric arterial vascular calcifications. No frank major mesenteric vascular obstruction. Foci of coronary artery calcification noted. 5. Bibasilar pulmonary fibrosis. Question pneumonia versus scarring left base. 6.  Uterus absent. 7.  Hiatal hernia present. Aortic Atherosclerosis (ICD10-I70.0). Electronically Signed   By: Lowella Grip III M.D.   On: 12/28/2017 18:25   Dg Chest Port 1 View  Result Date: 12/28/2017 CLINICAL DATA:  Vomiting, weakness, chills, history of lymphoplasmacytic lymphoma EXAM: PORTABLE CHEST 1 VIEW COMPARISON:  CT chest of 04/21/2016 and portable chest x-ray of 03/17/2016 FINDINGS: The lung metastasis is described on prior CT the chest are not well seen on today's chest x-ray. There is haziness  at the left lung base some of which is due to overlapping breast tissue. However effusion cannot be excluded. Mediastinal and hilar contours are stable and moderate cardiomegaly is stable. No acute bony abnormality is seen. IMPRESSION: 1. The previously demonstrated lung metastasis are not well seen by chest x-ray. 2. Stable moderate cardiomegaly. Difficult to assess the left lung base. Electronically Signed   By: Ivar Drape M.D.   On: 12/28/2017 16:58    Anti-infectives: Anti-infectives (From  admission, onward)   None      Assessment/Plan:  This patient admitted to the hospital with possible gastroenteritis versus small bowel obstruction.  She has a known history of lymphoma in the past and her CT scan suggests that there may be involvement of the small bowel as a possible cause of this small bowel obstruction.  She may be resolving her bowel obstruction however.  She has been treated without the need for a nasogastric tube.  She has not vomited.  With that in mind I will check a film this morning to see if there is any improvement in her bowel gas pattern.  She seems surprised that she has a kidney mass in spite of seeing her oncologist on a fairly regular basis.  I will discuss this with her admitting physicians and consider further oncology consult.  Florene Glen, MD, FACS  12/29/2017

## 2017-12-29 NOTE — Progress Notes (Signed)
Patient discharge teaching given, including activity, diet, follow-up appoints, and medications. Patient verbalized understanding of all discharge instructions. IV access was d/c'd. Vitals are stable. Skin is intact except as charted in most recent assessments. Pt to be escorted out by NT, to be driven home by family.  Kristy Solomon  

## 2017-12-29 NOTE — Plan of Care (Signed)
  Problem: Education: Goal: Knowledge of General Education information will improve 12/29/2017 0148 by Elonda Husky, RN Outcome: Progressing 12/29/2017 0147 by Elonda Husky, RN Outcome: Progressing   Problem: Health Behavior/Discharge Planning: Goal: Ability to manage health-related needs will improve 12/29/2017 0148 by Zaylei Mullane, Floyce Stakes, RN Outcome: Progressing 12/29/2017 0147 by Elonda Husky, RN Outcome: Progressing   Problem: Pain Managment: Goal: General experience of comfort will improve 12/29/2017 0148 by Elonda Husky, RN Outcome: Progressing 12/29/2017 0147 by Elonda Husky, RN Outcome: Progressing   Problem: Safety: Goal: Ability to remain free from injury will improve 12/29/2017 0148 by Elonda Husky, RN Outcome: Progressing 12/29/2017 0147 by Elonda Husky, RN Outcome: Progressing

## 2018-01-02 LAB — CULTURE, BLOOD (ROUTINE X 2)
CULTURE: NO GROWTH
Culture: NO GROWTH
Special Requests: ADEQUATE

## 2018-01-06 NOTE — Progress Notes (Signed)
[facility] day:  01/08/18  Chief Complaint: Kristy Solomon is a 84 y.o. female with lymphoplasmacytic lymphoma who is seen for 3 month assessment on Ibrutinib.  HPI: The patient was last seen in the medical oncology clinic on 09/29/2017.  At that time, she was doing well.  She denied any B symptoms.  WBC was 4000 with ANC of 2200. Hemoglobin was 14.2, hematocrit 44.2, and platelets 84,000. BUN was 22 with a creatinine of 1.28. Ferritin was 29.  IgM 1890 (26 - 217 mg/dL).  M-spike was 1.1 gm/dL.  She continued on Ibrutinib as prescribed.   Patient missed several of her scheduled monthly B12 injections. She returned on 11/30/2017 for her injection.  Patient was seen in the [facility] ER on 12/28/2017 for acute onset of nausea, vomiting, diarrhea, and weakness. Patient complained of diffuse abdominal pain. She presented HYPOtensive with a documented BP of 73/45. She received IVFs and antiemetics.   Abdomen and pelvic CT was concerning for small bowel obstruction with transitional zone at the distal ileum.  There was no mass associated with the obstruction.  LEFT renal mass measured 6.1 x 5.0 cm (previously 13.7 x 10.7 cm).  Suspect combination of mass and adenopathy due to lymphoma in the left adrenal. There were left perirenal lymph nodes, likely due to lymphoma, smaller compared to previous study. There was a 1.7 x 1.2 cm right external iliac lymph node, likely due to lymphoma. Subcutaneous deposits bilaterally were considerably smaller compared to 2017, but felt to represent residual foci of lymphoma.  There were sizable calculi (2.0 x 1.7 cm, 2.6 x 2.0 cm) in each kidney without obstruction.  She was seen in consult by Dr. Tama High, who elected to pursue conservative treatment approach. Patient was ultimately admitted overnight. Patient was seen in follow up consult by Dr. Phoebe Perch the next morning. Abdominal plain films on 12/29/2017 revealed near  complete resolution of the small bowel distention. Patient was discharged home with instructions to follow up with PCP and oncology.   In the interim, patient has been doing well since she was discharged from the hospital. Patient denies any acute concerns. She has no B symptoms. She denies recurrent infections. Patient denies any adenopathy. Patient denies urinary symptoms. Patient is eating well.  Weight is down 8 pounds. She denies pain in the clinic today.    Patient continues on her Ibrutinib as prescribed.    Past Medical History:  Diagnosis Date  . 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 Hyperlipidemia   . Hypertension   . Malignant lymphoma, lymphoplasmacytic ([place]) 12/27/2013  . Renal insufficiency     Past Surgical History:  Procedure Laterality Date  . ABDOMINAL HYSTERECTOMY    . HEMORRHOID SURGERY      Family History  Problem Relation Age of Onset  . Cancer Brother        throat ca  . Cancer Brother        bone cancer    Social History:  reports that she has never smoked. She has never used smokeless tobacco. She reports that she does not drink alcohol or use drugs.  She lives in [place] with her grandson and his wife. Her emergency contact is Cecille Po, her grandaughter- [phone]).  [name]'s phone number [phone]).  She lives in [place].  She has transportation issues.  She is alone today.  Allergies: No Known Allergies  Current Medications: Current Outpatient Medications  Medication Sig Dispense Refill  .  allopurinol (ZYLOPRIM) 100 MG tablet take 2 tablets by mouth once daily 60 tablet 1  . amLODipine-benazepril (LOTREL) 5-20 MG capsule Take 1 capsule by mouth daily.     Marland Kitchen atorvastatin (LIPITOR) 20 MG tablet Take 20 mg by mouth daily.    . colchicine 0.6 MG tablet Take 0.6 mg by mouth as needed. For gout  0  . furosemide (LASIX) 20 MG tablet Take 20 mg by mouth daily.    Marland Kitchen gabapentin (NEURONTIN) 300 MG capsule Take 300 mg by mouth 3 (three)  times daily.     . hydrALAZINE (APRESOLINE) 25 MG tablet Take 25 mg by mouth 3 (three) times daily.     . IMBRUVICA 420 MG TABS TAKE 420 MG BY MOUTH DAILY. 28 tablet 2  . loperamide (IMODIUM A-D) 2 MG tablet Take 1 tablet (2 mg total) by mouth 4 (four) times daily as needed for diarrhea or loose stools. 30 tablet 0  . metoprolol tartrate (LOPRESSOR) 25 MG tablet Take 25 mg by mouth 2 (two) times daily.  0  . omeprazole (PRILOSEC) 20 MG capsule Take 20 mg by mouth daily.  0   No current facility-administered medications for this visit.    Facility-Administered Medications Ordered in Other Visits  Medication Dose Route Frequency Provider Last Rate Last Dose  . cyanocobalamin ((VITAMIN B-12)) injection 1,000 mcg  1,000 mcg Intramuscular Once Lequita Asal, MD        Review of Systems:  GENERAL: Feels "fine". No problems.  No fevers or sweats. Weight down 8 pounds (fluctuates).  PERFORMANCE STATUS (ECOG): 2 HEENT: No runny nose, sore throat, mouth sores or tenderness. Lungs:  No shortness of breath or cough.  No wheezing.  No hemoptysis. Cardiac: No chest pain, palpitations, orthopnea, or PND.  Blood pressure issues. GI: No nausea, vomiting, diarrhea, constipation, melena or hematochezia. GU: No urgency, frequency, dysuria or hematuria. Large bilateral nephrolithiasis.  Musculoskeletal: No back pain. No joint pain. No muscle tenderness. Extremities: No pain or swelling. Skin: No skin nodules. No rashes. Neuro: No headache, numbness or weakness, balance or coordination issues. Endocrine: No diabetes, thyroid issues, hot flashes or night sweats. Psych:No mood changes, depression or anxiety. Pain: No focal pain. Review of systems: All other systems reviewed and found to be negative.  Physical Exam:  Blood pressure 138/72, pulse (!) 53, temperature 98.1 F (36.7 C), temperature source Tympanic, resp. rate 18, weight 197 lb 9 oz (89.6 kg). GENERAL: Elderly woman  sitting comfortably in the exam room in clinic in no acute distress. MENTAL STATUS: Alert and oriented to person, place and time. HEAD: Wearing a black hat.  Straight gray hair. Normocephalic, atraumatic, face symmetric, no Cushingoid features. EYES: Brown eyes. Pupils equal round and reactive to light and accomodation. No conjunctivitis or scleral icterus. ENT: Oropharynx clear without lesion. Tongue normal. Mucous membranes moist.  RESPIRATORY: Normal respiratory excursion. No rales, wheezes or rhonchi. CARDIOVASCULAR: Regular rate and rhythm without murmur, rub or gallop. ABDOMEN: Soft, non-tender with active bowel sounds and no appreciable hepatosplenomegaly. No guarding or rebound tenderness.  SKIN: Yellow painted nails.  No palpable nodules in subcutaneous tissues.  EXTREMITIES: No edema, no skin discoloration or tenderness. No palpable cords. LYMPH NODES: No palpable cervical, supraclavicular, axillary or inguinal adenopathy  NEUROLOGICAL: Unremarkable. PSYCH: Appropriate   No visits with results within 3 Day(s) from this visit.  Latest known visit with results is:  Admission on 12/28/2017, Discharged on 12/29/2017  Component Date Value Ref Range Status  . Lipase 12/28/2017  30  11 - 51 U/L Final   Performed at [facility], [address] [place], [place] [address]  . Sodium 12/28/2017 141  135 - 145 mmol/L Final  . Potassium 12/28/2017 4.3  3.5 - 5.1 mmol/L Final  . Chloride 12/28/2017 106  101 - 111 mmol/L Final  . CO2 12/28/2017 23  22 - 32 mmol/L Final  . Glucose, Bld 12/28/2017 132* 65 - 99 mg/dL Final  . BUN 12/28/2017 33* 6 - 20 mg/dL Final  . Creatinine, Ser 12/28/2017 1.41* 0.44 - 1.00 mg/dL Final  . Calcium 12/28/2017 8.5* 8.9 - 10.3 mg/dL Final  . Total Protein 12/28/2017 7.8  6.5 - 8.1 g/dL Final  . Albumin 12/28/2017 3.4* 3.5 - 5.0 g/dL Final  . AST 12/28/2017 33  15 - 41 U/L Final  . ALT 12/28/2017 23  14 - 54 U/L Final  . Alkaline  Phosphatase 12/28/2017 47  38 - 126 U/L Final  . Total Bilirubin 12/28/2017 0.8  0.3 - 1.2 mg/dL Final  . GFR calc non Af Amer 12/28/2017 33* >60 mL/min Final  . GFR calc Af Amer 12/28/2017 38* >60 mL/min Final   Comment: (NOTE) The eGFR has been calculated using the CKD EPI equation. This calculation has not been validated in all clinical situations. eGFR's persistently <60 mL/min signify possible Chronic Kidney Disease.   Georgiann Hahn gap 12/28/2017 12  5 - 15 Final   Performed at [facility], [address] [place], [place] [address]  . WBC 12/28/2017 11.5* 3.6 - 11.0 K/uL Final  . RBC 12/28/2017 5.86* 3.80 - 5.20 MIL/uL Final  . Hemoglobin 12/28/2017 15.0  12.0 - 16.0 g/dL Final  . HCT 12/28/2017 47.7* 35.0 - 47.0 % Final  . MCV 12/28/2017 81.3  80.0 - 100.0 fL Final  . MCH 12/28/2017 25.6* 26.0 - 34.0 pg Final  . MCHC 12/28/2017 31.5* 32.0 - 36.0 g/dL Final  . RDW 12/28/2017 16.9* 11.5 - 14.5 % Final  . Platelets 12/28/2017 102* 150 - 440 K/uL Final   Performed at [facility], [address] [place], [place] [address]  . Lactic Acid, Venous 12/28/2017 1.7  0.5 - 1.9 mmol/L Final   Performed at [facility], [address] [place], [place] [address]  . Specimen Description 12/28/2017 BLOOD RIGHT ANTECUBITAL   Final  . Special Requests 12/28/2017 BOTTLES DRAWN AEROBIC AND ANAEROBIC Blood Culture adequate volume   Final  . Culture 12/28/2017    Final                   Value:NO GROWTH 5 DAYS Performed at [facility], [address] [place], [place] [address]   . Report Status 12/28/2017 01/02/2018 FINAL   Final  . Specimen Description 12/28/2017 BLOOD BLOOD LEFT HAND   Final  . Special Requests 12/28/2017 BOTTLES DRAWN AEROBIC AND ANAEROBIC Blood Culture results may not be optimal due to an excessive volume of blood received in culture bottles   Final  . Culture 12/28/2017    Final                   Value:NO GROWTH 5 DAYS Performed at  [facility], [address], [place],  [address]   . Report Status 12/28/2017 01/02/2018 FINAL   Final  . Influenza A By PCR 12/28/2017 NEGATIVE  NEGATIVE Final  . Influenza B By PCR 12/28/2017 NEGATIVE  NEGATIVE Final   Comment: (NOTE) The Xpert Xpress Flu assay  is intended as an aid in the diagnosis of  influenza and should not be used as a sole basis for treatment.  This  assay is FDA approved for nasopharyngeal swab specimens only. Nasal  washings and aspirates are unacceptable for Xpert Xpress Flu testing. Performed at [facility], [address], [place], [place] [address]   . Sodium 12/29/2017 140  135 - 145 mmol/L Final  . Potassium 12/29/2017 3.9  3.5 - 5.1 mmol/L Final  . Chloride 12/29/2017 108  101 - 111 mmol/L Final  . CO2 12/29/2017 23  22 - 32 mmol/L Final  . Glucose, Bld 12/29/2017 105* 65 - 99 mg/dL Final  . BUN 12/29/2017 36* 6 - 20 mg/dL Final  . Creatinine, Ser 12/29/2017 1.57* 0.44 - 1.00 mg/dL Final  . Calcium 12/29/2017 7.6* 8.9 - 10.3 mg/dL Final  . GFR calc non Af Amer 12/29/2017 29* >60 mL/min Final  . GFR calc Af Amer 12/29/2017 33* >60 mL/min Final   Comment: (NOTE) The eGFR has been calculated using the CKD EPI equation. This calculation has not been validated in all clinical situations. eGFR's persistently <60 mL/min signify possible Chronic Kidney Disease.   Georgiann Hahn gap 12/29/2017 9  5 - 15 Final   Performed at [facility], [address] [place], [place] [address]  . WBC 12/29/2017 5.3  3.6 - 11.0 K/uL Final  . RBC 12/29/2017 5.02  3.80 - 5.20 MIL/uL Final  . Hemoglobin 12/29/2017 12.7  12.0 - 16.0 g/dL Final  . HCT 12/29/2017 40.5  35.0 - 47.0 % Final  . MCV 12/29/2017 80.8  80.0 - 100.0 fL Final  . MCH 12/29/2017 25.3* 26.0 - 34.0 pg Final  . MCHC 12/29/2017 31.3* 32.0 - 36.0 g/dL Final  . RDW 12/29/2017 16.4* 11.5 - 14.5 % Final  . Platelets 12/29/2017 74* 150 - 440 K/uL Final   Performed at [facility], [address], [place], [place] [address]  . Magnesium 12/29/2017 1.8  1.7 - 2.4 mg/dL Final   Performed at [facility], [address] [place], [place] [address]    Assessment:  Kristy Solomon is a 84 y.o. 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 She presented with massive subcutaneous deposits, pulmonary nodules, involvement of left kidney, and bowel. Disease was unresponsive to Rituxan in 2014.   Chest, abdomen, and pelvic CT scan on 03/17/2016 revealed progression of diffuse lymphoma with subcutaneous deposits throughout the subcutaneous fat of the chest, abdomen,and pelvis. There were multiple nodular infiltrates throughout the lungs, likely due to lymphoma but could also represent infection or atypical infection. There was massive retroperitoneal lymphadenopathy with diffuse retroperitoneal, mesenteric, and pelvic lymphadenopathy. There was wall thickening and pelvicsmall bowel probably representing lymphomas involvement. There was probable direct invasion of the left kidney.  Biopsy of the dominant SQ nodal conglomeration around the right flank on 03/18/2016 revealed a persistent low-grade CD5 positive B-cell lymphoma.  Flow cytometry was positive for CD20, CD79a, CD5, CD138. Neoplastic cells were positive for kappa and cyclin D1.  CD 23 was negative.   Additional molecular markers are pending.  Bone marrow aspirate and biopsy on 03/18/2016 revealed no evidence of lymphoma or plasma cell neoplasm (0.2% kappa monoclonal plasma cells by flow analysis).  Marrow was hypercellular for age (50-60%) with trilineage hematopoiesis and no increased blasts. There was mild to moderate widespread increase in reticulin fibers. There was no storage iron detected.  The following studies were abnormal:  Beta2-microglobulin was 9.6 (0.6-2.4).  Serum viscosity was 5.5 (1.6-1.9).  Uric acid was 11.1 on 03/19/2016 and 5.7 on 05/23/2016.      Normal studies included:  hepatitis B surface antigen, hepatitis B core antibody total, hepatitis C antibody, HIV testing, and LDH (123).  G6PD assay was 9.2 (4.6-13.5).   She has iron deficiency (ferritin 27; iron saturation 11%), B12 deficiency (147), and folate deficiency (5.9).  She has chronic renal insufficiency (BUN was 30-35, creatinine was 1.38 - 1.80 with a GFR 29-39 ml/min).  She receives B12 (began 03/28/2016; last 11/30/2017).  She is on folic acid.  Folate was 77.5 on 05/09/2016 and 42 on 03/13/2017.   She received 1 week of chlorambucil with a tapering dose of prednisone (began 03/30/2016).  Cortisol was 9.5 on 05/17/2016.  She began ibrutinib on 04/26/2016.  She has mild thrombocytopenia (94,000).   SPEP has been followed:  5.0 gm/dL on 03/18/2016, 4.5 on 04/25/2016,  2.3 on 06/20/2016, 2.0 on 10/24/2016, 1.5 on 12/19/2016, 1.4 on 03/13/2017, and 1.1 on 07/01/2017 .  IgM has been followed:  > 5850 on 03/18/2016, > 5850 on 04/25/2016, 4652 on 06/09/2016, 3719 on 07/18/2016, 3429 on 10/24/2016, 2821 on 12/19/2016, 2295 on 03/13/2017, 2500 on 06/30/2017, and 1890 09/29/2017.  Chest, abdomen, and pelvic CT on 04/21/2016 revealed a partial response to therapy.  There were bilateral pulmonary nodules/masses, measuring up to 5.6 cm in the right lower lobe (improved).  There was trace left pleural effusion.  There was a 12.7 cm left renal/perirenal mass (decreased) with associated mild left hydronephrosis.  Retroperitoneal/left pelvic lymphadenopathy was mildly decreased.  Multifocal subcutaneous nodules, measuring up to 4.5 cm in the left lower anterior abdominal wall were stable to mildly improved.  Abdomen and pelvic CT on 12/28/2017 was concerning for small bowel obstruction with transitional zone at the distal ileum.  There was no associated mass.  LEFT renal mass measured 6.1 x 5.0 cm (previously 13.7 x 10.7 cm).  Suspect combination of mass and adenopathy due to lymphoma in the left  adrenal. This area was much less pronounced than on prior study. There was a 1.7 x 1.2 cm lymph node in the RIGHT external iliac node chain.  Subcutaneous deposits bilaterally were considerably smaller compared to 2017.  There were sizable calculi (2.0 x 1.7 cm, 2.6 x 2.0 cm) in each kidney without obstruction.  Symptomatically, patient is doing well. She denies any B symptoms. Exam is unremarkable.  Plan: 1.  Labs today:  CBC with diff, CMP, SPEP, IgM, folate. 2.  Discuss interval admission for SBO vs. gastroenteritis with ileus. CT imaging demonstrated a reduction in size of LEFT renal mass and known adenopathy and cutaneous lesions due to lymphoma. Discuss improvement in disease and associated adenopathy.  Anticipate follow-up imaging in 6 months.  3.  Continue Ibrutinib 420 mg a day as previously prescribed.  4.  Follow-up today with oral chemotherapy pharmacist for specialty pharmacy routine follow up.  5.  B12 injection today, then monthly.  6.  Discuss follow-up with Dr. Hollice Espy regarding enlarging bilateral renal stones.  Risk for obstruction and infection. 7.  Discuss folate deficiency. Patient no longer taking. Will send refill in to her pharmacy.  8.  RTC in 3 months for MD assessment, labs (CBC with diff, CMP, SPEP, IgM), and B12.   Honor Loh, NP  01/08/2018, 10:50 AM   I saw and evaluated the patient, participating in the key portions of the service and reviewing pertinent diagnostic studies and records.  I reviewed the nurse  practitioner's note and agree with the findings and the plan.  The assessment and plan were discussed with the patient.  A few questions were asked by the patient and answered.   Lequita Asal, MD 01/08/2018, 10:50 AM

## 2018-01-08 ENCOUNTER — Inpatient Hospital Stay: Payer: Medicare HMO

## 2018-01-08 ENCOUNTER — Inpatient Hospital Stay: Payer: Medicare HMO | Attending: Hematology and Oncology | Admitting: Hematology and Oncology

## 2018-01-08 ENCOUNTER — Other Ambulatory Visit: Payer: Self-pay | Admitting: Urgent Care

## 2018-01-08 ENCOUNTER — Encounter: Payer: Self-pay | Admitting: Hematology and Oncology

## 2018-01-08 ENCOUNTER — Telehealth: Payer: Self-pay | Admitting: *Deleted

## 2018-01-08 ENCOUNTER — Other Ambulatory Visit: Payer: Self-pay | Admitting: Hematology and Oncology

## 2018-01-08 VITALS — BP 138/72 | HR 53 | Temp 98.1°F | Resp 18 | Wt 197.6 lb

## 2018-01-08 DIAGNOSIS — E611 Iron deficiency: Secondary | ICD-10-CM | POA: Insufficient documentation

## 2018-01-08 DIAGNOSIS — E538 Deficiency of other specified B group vitamins: Secondary | ICD-10-CM

## 2018-01-08 DIAGNOSIS — N2889 Other specified disorders of kidney and ureter: Secondary | ICD-10-CM

## 2018-01-08 DIAGNOSIS — E790 Hyperuricemia without signs of inflammatory arthritis and tophaceous disease: Secondary | ICD-10-CM

## 2018-01-08 DIAGNOSIS — N200 Calculus of kidney: Secondary | ICD-10-CM | POA: Diagnosis not present

## 2018-01-08 DIAGNOSIS — C8300 Small cell B-cell lymphoma, unspecified site: Secondary | ICD-10-CM

## 2018-01-08 DIAGNOSIS — Z7189 Other specified counseling: Secondary | ICD-10-CM

## 2018-01-08 DIAGNOSIS — N189 Chronic kidney disease, unspecified: Secondary | ICD-10-CM | POA: Insufficient documentation

## 2018-01-08 DIAGNOSIS — D500 Iron deficiency anemia secondary to blood loss (chronic): Secondary | ICD-10-CM

## 2018-01-08 LAB — COMPREHENSIVE METABOLIC PANEL
ALT: 19 U/L (ref 14–54)
AST: 21 U/L (ref 15–41)
Albumin: 3.4 g/dL — ABNORMAL LOW (ref 3.5–5.0)
Alkaline Phosphatase: 38 U/L (ref 38–126)
Anion gap: 9 (ref 5–15)
BUN: 25 mg/dL — ABNORMAL HIGH (ref 6–20)
CO2: 25 mmol/L (ref 22–32)
Calcium: 8.7 mg/dL — ABNORMAL LOW (ref 8.9–10.3)
Chloride: 103 mmol/L (ref 101–111)
Creatinine, Ser: 1.43 mg/dL — ABNORMAL HIGH (ref 0.44–1.00)
GFR calc Af Amer: 37 mL/min — ABNORMAL LOW (ref >60)
GFR calc non Af Amer: 32 mL/min — ABNORMAL LOW (ref >60)
Glucose, Bld: 85 mg/dL (ref 65–99)
Potassium: 4.3 mmol/L (ref 3.5–5.1)
Sodium: 137 mmol/L (ref 135–145)
Total Bilirubin: 0.7 mg/dL (ref 0.3–1.2)
Total Protein: 7.1 g/dL (ref 6.5–8.1)

## 2018-01-08 LAB — CBC WITH DIFFERENTIAL/PLATELET
Basophils Absolute: 0 10*3/uL (ref 0–0.1)
Basophils Relative: 1 %
Eosinophils Absolute: 0 10*3/uL (ref 0–0.7)
Eosinophils Relative: 1 %
HCT: 43.1 % (ref 35.0–47.0)
Hemoglobin: 13.9 g/dL (ref 12.0–16.0)
Lymphocytes Relative: 31 %
Lymphs Abs: 1.1 10*3/uL (ref 1.0–3.6)
MCH: 26.2 pg (ref 26.0–34.0)
MCHC: 32.3 g/dL (ref 32.0–36.0)
MCV: 81.1 fL (ref 80.0–100.0)
Monocytes Absolute: 0.6 10*3/uL (ref 0.2–0.9)
Monocytes Relative: 15 %
Neutro Abs: 1.9 10*3/uL (ref 1.4–6.5)
Neutrophils Relative %: 52 %
Platelets: 98 10*3/uL — ABNORMAL LOW (ref 150–440)
RBC: 5.32 MIL/uL — ABNORMAL HIGH (ref 3.80–5.20)
RDW: 16.2 % — ABNORMAL HIGH (ref 11.5–14.5)
WBC: 3.6 10*3/uL (ref 3.6–11.0)

## 2018-01-08 LAB — FOLATE: Folate: 11.4 ng/mL (ref >5.9)

## 2018-01-08 LAB — URIC ACID: Uric Acid, Serum: 7.9 mg/dL — ABNORMAL HIGH (ref 2.3–6.6)

## 2018-01-08 MED ORDER — FOLIC ACID 1 MG PO TABS: 1.0000 mg | ORAL_TABLET | Freq: Every day | ORAL | 1 refills | Status: DC

## 2018-01-08 MED ORDER — ALLOPURINOL 100 MG PO TABS: 100.0000 mg | ORAL_TABLET | Freq: Every day | ORAL | 3 refills | Status: DC

## 2018-01-08 MED ORDER — CYANOCOBALAMIN 1000 MCG/ML IJ SOLN
1000.0000 ug | Freq: Once | INTRAMUSCULAR | Status: AC
Start: 2018-01-08 — End: 2018-01-08
  Administered 2018-01-08: 1000 ug via INTRAMUSCULAR
  Filled 2018-01-08: qty 1

## 2018-01-08 NOTE — Telephone Encounter (Signed)
Called patient to inform her that her uric acid is elevated.  MD recommends allopurinol 100 mg daily.  Patient states she does not have any.  Will have Gaspar Bidding, NP send rx to pharmacy.

## 2018-01-08 NOTE — Progress Notes (Signed)
Patient here today as hospital follow up.  Patient states her appetite is still not back to 100%.  Otherwise, no complaints.

## 2018-01-09 LAB — PROTEIN ELECTROPHORESIS, SERUM
A/G Ratio: 0.8 (ref 0.7–1.7)
Albumin ELP: 3.1 g/dL (ref 2.9–4.4)
Alpha-1-Globulin: 0.2 g/dL (ref 0.0–0.4)
Alpha-2-Globulin: 0.6 g/dL (ref 0.4–1.0)
Beta Globulin: 0.9 g/dL (ref 0.7–1.3)
Gamma Globulin: 2 g/dL — ABNORMAL HIGH (ref 0.4–1.8)
Globulin, Total: 3.7 g/dL (ref 2.2–3.9)
M-Spike, %: 1.3 g/dL — ABNORMAL HIGH
Total Protein ELP: 6.8 g/dL (ref 6.0–8.5)

## 2018-01-09 LAB — IGM: IgM (Immunoglobulin M), Srm: 1769 mg/dL — ABNORMAL HIGH (ref 26–217)

## 2018-01-11 DIAGNOSIS — N200 Calculus of kidney: Secondary | ICD-10-CM | POA: Insufficient documentation

## 2018-02-05 ENCOUNTER — Inpatient Hospital Stay: Payer: Medicare HMO | Attending: Hematology and Oncology

## 2018-02-05 ENCOUNTER — Inpatient Hospital Stay: Payer: Medicare HMO

## 2018-02-12 MED FILL — IMBRUVICA 420 MG TAB: 420 | 28 days supply | Qty: 28 | Fill #1

## 2018-02-15 ENCOUNTER — Telehealth: Payer: Self-pay | Admitting: *Deleted

## 2018-02-15 NOTE — Telephone Encounter (Signed)
Patient called stating she needed to make an appointment.  I checked her appointment schedule and called her back to give her the dates times.  Patient appreciative.

## 2018-03-05 ENCOUNTER — Inpatient Hospital Stay: Payer: Medicare HMO | Attending: Hematology and Oncology

## 2018-03-12 MED FILL — IMBRUVICA 420 MG TAB: 420 | 28 days supply | Qty: 28 | Fill #2

## 2018-03-15 ENCOUNTER — Ambulatory Visit (INDEPENDENT_AMBULATORY_CARE_PROVIDER_SITE_OTHER): Payer: Medicare HMO | Admitting: Podiatry

## 2018-03-15 ENCOUNTER — Encounter: Payer: Self-pay | Admitting: Podiatry

## 2018-03-15 ENCOUNTER — Ambulatory Visit: Payer: Medicare HMO | Admitting: Podiatry

## 2018-03-15 DIAGNOSIS — M79675 Pain in left toe(s): Secondary | ICD-10-CM

## 2018-03-15 DIAGNOSIS — B351 Tinea unguium: Secondary | ICD-10-CM

## 2018-03-15 DIAGNOSIS — M79674 Pain in right toe(s): Secondary | ICD-10-CM | POA: Diagnosis not present

## 2018-03-15 NOTE — Progress Notes (Signed)
Complaint:  Visit Type: Patient returns to my office for continued preventative foot care services. Complaint: Patient states" my nails have grown long and thick and become painful to walk and wear shoes" . The patient presents for preventative foot care services. No changes to ROS  Podiatric Exam: Vascular: dorsalis pedis and posterior tibial pulses are palpable bilateral. Capillary return is immediate. Temperature gradient is WNL. Skin turgor WNL  Sensorium: Normal Semmes Weinstein monofilament test. Normal tactile sensation bilaterally. Nail Exam: Pt has thick disfigured discolored nails with subungual debris noted bilateral entire nail hallux through fifth toenails Ulcer Exam: There is no evidence of ulcer or pre-ulcerative changes or infection. Orthopedic Exam: Muscle tone and strength are WNL. No limitations in general ROM. No crepitus or effusions noted. Foot type and digits show no abnormalities. Bony prominences are unremarkable. Skin: No Porokeratosis. No infection or ulcers  Diagnosis:  Onychomycosis, , Pain in right toe, pain in left toes  Treatment & Plan Procedures and Treatment: Consent by patient was obtained for treatment procedures.   Debridement of mycotic and hypertrophic toenails, 1 through 5 bilateral and clearing of subungual debris. No ulceration, no infection noted.  Return Visit-Office Procedure: Patient instructed to return to the office for a follow up visit 3 months for continued evaluation and treatment.    Holton Sidman DPM 

## 2018-04-02 ENCOUNTER — Telehealth: Payer: Self-pay | Admitting: *Deleted

## 2018-04-02 NOTE — Telephone Encounter (Signed)
Appt for lab/MD/Inj were changed to 04/12/18 I spoke to her granddaughter Baker Janus and made her  Aware of the date and time change

## 2018-04-04 ENCOUNTER — Other Ambulatory Visit: Payer: Self-pay | Admitting: Urgent Care

## 2018-04-04 DIAGNOSIS — C8300 Small cell B-cell lymphoma, unspecified site: Secondary | ICD-10-CM

## 2018-04-09 ENCOUNTER — Ambulatory Visit: Payer: Medicare HMO

## 2018-04-09 ENCOUNTER — Other Ambulatory Visit: Payer: Medicare HMO

## 2018-04-09 ENCOUNTER — Ambulatory Visit: Payer: Medicare HMO | Admitting: Hematology and Oncology

## 2018-04-10 MED FILL — IMBRUVICA 420 MG TAB: 420 | 28 days supply | Qty: 28 | Fill #0

## 2018-04-11 ENCOUNTER — Other Ambulatory Visit: Payer: Self-pay | Admitting: Urgent Care

## 2018-04-11 DIAGNOSIS — E790 Hyperuricemia without signs of inflammatory arthritis and tophaceous disease: Secondary | ICD-10-CM

## 2018-04-11 NOTE — Progress Notes (Signed)
[facility] day:  04/12/18  Chief Complaint: Kristy Solomon is a 84 y.o. female with lymphoplasmacytic lymphoma who is seen for 3 month assessment on Ibrutinib.  HPI: The patient was last seen in the medical oncology clinic on 01/08/2018.  At that time, patient had recently been discharged from the hospital and was doing well.  She denied any acute complaints.  Despite eating well patient has lost 8 pounds.  She continued on Ibrutinib as prescribed. Exam was unremarkable.  M spike 1.3 (previously 1.1).  IgM 1769 (previously 1890).  Folic acid elevated at 7.9; she was started on allopurinol 100 mg daily.  BUN 25 and creatinine 1.43.  Despite ordered B12 injections, patient has not returned to clinic for scheduled B12 injections since her last visit. Last injection was given on 01/08/2018.  In the interim, patient has been doing well over the last few months. She denies any acute complaints today. Patient does make mention of a recent gout attack. (+) podagra in LEFT foot making it difficult for her to ambulate. Patient notes that this resolved without treatment.   Patient denies that she has experienced any B symptoms. She denies any interval infections. Patient denies bleeding; no hematochezia, melena, or gross hematuria. She has not appreciated any new areas of palpable adenopathy.   Patient advises that she maintains an adequate appetite. She is eating well. Weight today is 204 lb 2 oz (92.6 kg), which compared to her last visit to the clinic, represents a 7 pound increase.   Patient denies pain in the clinic today.  Past Medical History:  Diagnosis Date  . 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 Hyperlipidemia   . Hypertension   . Malignant lymphoma, lymphoplasmacytic ([place]) 12/27/2013  . Renal insufficiency     Past Surgical History:  Procedure Laterality Date  . ABDOMINAL HYSTERECTOMY    . HEMORRHOID SURGERY      Family History   Problem Relation Age of Onset  . Cancer Brother        throat ca  . Cancer Brother        bone cancer    Social History:  reports that she has never smoked. She has never used smokeless tobacco. She reports that she does not drink alcohol or use drugs.  She lives in [place] with her grandson and his wife. Her emergency contact is Cecille Po, her grandaughter- [phone]).  Kristy Solomon's phone number [phone]).  She lives in [place].  She has transportation issues.  She is alone today.  Allergies: No Known Allergies  Current Medications: Current Outpatient Medications  Medication Sig Dispense Refill  . allopurinol (ZYLOPRIM) 100 MG tablet Take 2 tablets (200 mg total) by mouth daily. 60 tablet 2  . amLODipine-benazepril (LOTREL) 5-20 MG capsule Take 1 capsule by mouth daily.     Marland Kitchen atorvastatin (LIPITOR) 20 MG tablet Take 20 mg by mouth daily.    . colchicine 0.6 MG tablet Take 0.6 mg by mouth as needed. For gout  0  . folic acid (FOLVITE) 1 MG tablet Take 1 tablet (1 mg total) by mouth daily. 90 tablet 1  . furosemide (LASIX) 20 MG tablet Take 20 mg by mouth daily.    Marland Kitchen gabapentin (NEURONTIN) 300 MG capsule Take 300 mg by mouth 3 (three) times daily.     . hydrALAZINE (APRESOLINE) 25 MG tablet Take 25 mg by mouth 3 (three) times daily.     . IMBRUVICA 420 MG TABS  TAKE 1 TABLET (420 MG) BY MOUTH DAILY. 28 tablet 2  . loperamide (IMODIUM A-D) 2 MG tablet Take 1 tablet (2 mg total) by mouth 4 (four) times daily as needed for diarrhea or loose stools. 30 tablet 0  . metoprolol tartrate (LOPRESSOR) 25 MG tablet Take 25 mg by mouth 2 (two) times daily.  0  . omeprazole (PRILOSEC) 20 MG capsule Take 20 mg by mouth daily.  0   No current facility-administered medications for this visit.    Facility-Administered Medications Ordered in Other Visits  Medication Dose Route Frequency Provider Last Rate Last Dose  . cyanocobalamin ((VITAMIN B-12)) injection 1,000 mcg  1,000 mcg  Intramuscular Once Lequita Asal, MD       Review of Systems  Constitutional: Positive for weight loss (weight up 7 pounds). Negative for diaphoresis, fever and malaise/fatigue.       "Im doing alright. I ain't got nothing to complain about".   HENT: Negative.   Eyes: Negative.   Respiratory: Negative for cough, hemoptysis, sputum production and shortness of breath.   Cardiovascular: Negative for chest pain, palpitations, orthopnea, leg swelling and PND.  Gastrointestinal: Negative for abdominal pain, blood in stool, constipation, diarrhea, melena, nausea and vomiting.  Genitourinary: Negative for dysuria, frequency, hematuria and urgency.  Musculoskeletal: Negative for back pain, falls, joint pain and myalgias.       PMH significant for gout - recent flare which manifested as LEFT sided podagra.   Skin: Negative for itching and rash.  Neurological: Negative for dizziness, tremors, weakness and headaches.  Endo/Heme/Allergies: Does not bruise/bleed easily.  Psychiatric/Behavioral: Negative for depression, memory loss and suicidal ideas. The patient is not nervous/anxious and does not have insomnia.   All other systems reviewed and are negative.  Performance status (ECOG): 2 - Symptomatic, <50% confined to bed   Vital signs BP (!) 197/81 Comment: Recheck  Pulse (!) 53   Temp (!) 97.4 F (36.3 C) (Tympanic)   Resp 18   Wt 204 lb 2 oz (92.6 kg)   BMI 36.16 kg/m   Physical Exam  Constitutional: She is oriented to person, place, and time and well-developed, well-nourished, and in no distress.  HENT:  Head: Normocephalic and atraumatic.  Eyes: Pupils are equal, round, and reactive to light. EOM are normal. No scleral icterus.  Neck: Normal range of motion. Neck supple. No tracheal deviation present. No thyromegaly present.  Cardiovascular: Normal rate, regular rhythm and normal heart sounds. Exam reveals no gallop and no friction rub.  No murmur heard. Pulmonary/Chest: Effort  normal and breath sounds normal. No respiratory distress. She has no wheezes. She has no rales.  Abdominal: Soft. Bowel sounds are normal. She exhibits no distension. There is no tenderness.  Musculoskeletal: Normal range of motion. She exhibits no edema or tenderness.  Lymphadenopathy:    She has no cervical adenopathy.    She has no axillary adenopathy.       Right: No inguinal and no supraclavicular adenopathy present.       Left: No inguinal and no supraclavicular adenopathy present.  Neurological: She is alert and oriented to person, place, and time.  Skin: Skin is warm and dry. No rash noted. No erythema.  Psychiatric: Mood, affect and judgment normal.  Nursing note and vitals reviewed.   Appointment on 04/12/2018  Component Date Value Ref Range Status  . IgM (Immunoglobulin M), Srm 04/12/2018 1,244* 26 - 217 mg/dL Final   Comment: (NOTE) Results confirmed on dilution. Performed At: [facility]  [place], [place] [id number] Rush Farmer MD XQ:[id number]   . Total Protein ELP 04/12/2018 6.4  6.0 - 8.5 g/dL Final  . Albumin ELP 04/12/2018 3.2  2.9 - 4.4 g/dL Final  . Alpha-1-Globulin 04/12/2018 0.3  0.0 - 0.4 g/dL Final  . Alpha-2-Globulin 04/12/2018 0.6  0.4 - 1.0 g/dL Final  . Beta Globulin 04/12/2018 0.9  0.7 - 1.3 g/dL Final  . Gamma Globulin 04/12/2018 1.4  0.4 - 1.8 g/dL Final  . M-Spike, % 04/12/2018 0.9* Not Observed g/dL Final  . SPE Interp. 04/12/2018 Comment   Final   Comment: (NOTE) The SPE pattern demonstrates a single peak (M-spike) in the gamma region which may represent monoclonal protein. This peak may also be caused by circulating immune complexes, cryoglobulins, C-reactive protein, fibrinogen or hemolysis.  If clinically indicated, the presence of a monoclonal gammopathy may be confirmed by immuno- fixation, as well as an evaluation of the urine for the presence of Bence-Jones protein. Performed At: [facility] [place], [place] [id number] Rush Farmer MD SH:[id number]   . Comment 04/12/2018 Comment   Final   Comment: (NOTE) Protein electrophoresis scan will follow via computer, mail, or courier delivery.   Marland Kitchen GLOBULIN, TOTAL 04/12/2018 3.2  2.2 - 3.9 g/dL Corrected  . A/G Ratio 04/12/2018 1.0  0.7 - 1.7 Corrected  . Sodium 04/12/2018 137  135 - 145 mmol/L Final  . Potassium 04/12/2018 4.2  3.5 - 5.1 mmol/L Final  . Chloride 04/12/2018 108  98 - 111 mmol/L Final   Please note change in reference range.  . CO2 04/12/2018 20* 22 - 32 mmol/L Final  . Glucose, Bld 04/12/2018 114* 70 - 99 mg/dL Final   Please note change in reference range.  . BUN 04/12/2018 43* 8 - 23 mg/dL Final   Please note change in reference range.  . Creatinine, Ser 04/12/2018 1.61* 0.44 - 1.00 mg/dL Final  . Calcium 04/12/2018 9.2  8.9 - 10.3 mg/dL Final  . Total Protein 04/12/2018 6.9  6.5 - 8.1 g/dL Final  . Albumin 04/12/2018 3.4* 3.5 - 5.0 g/dL Final  . AST 04/12/2018 16  15 - 41 U/L Final  . ALT 04/12/2018 14  0 - 44 U/L Final   Please note change in reference range.  . Alkaline Phosphatase 04/12/2018 51  38 - 126 U/L Final  . Total Bilirubin 04/12/2018 0.6  0.3 - 1.2 mg/dL Final  . GFR calc non Af Amer 04/12/2018 28* >60 mL/min Final  . GFR calc Af Amer 04/12/2018 32* >60 mL/min Final   Comment: (NOTE) The eGFR has been calculated using the CKD EPI equation. This calculation has not been validated in all clinical situations. eGFR's persistently <60 mL/min signify possible Chronic Kidney Disease.   Georgiann Hahn gap 04/12/2018 9  5 - 15 Final   Performed at [facility], [address] [place], [place] [address]  . WBC 04/12/2018 3.1* 3.6 - 11.0 K/uL Final  . RBC 04/12/2018 4.85  3.80 - 5.20 MIL/uL Final  . Hemoglobin 04/12/2018 12.6  12.0 - 16.0 g/dL Final  . HCT 04/12/2018 38.7  35.0 - 47.0 % Final  . MCV 04/12/2018 79.8* 80.0 - 100.0 fL Final  . MCH 04/12/2018 25.9* 26.0 - 34.0 pg Final  .  MCHC 04/12/2018 32.5  32.0 - 36.0 g/dL Final  . RDW 04/12/2018 16.3* 11.5 - 14.5 % Final  . Platelets 04/12/2018 84* 150 - 440 K/uL Final  . Neutrophils Relative %  04/12/2018 56  % Final  . Neutro Abs 04/12/2018 1.7  1.4 - 6.5 K/uL Final  . Lymphocytes Relative 04/12/2018 26  % Final  . Lymphs Abs 04/12/2018 0.8* 1.0 - 3.6 K/uL Final  . Monocytes Relative 04/12/2018 17  % Final  . Monocytes Absolute 04/12/2018 0.5  0.2 - 0.9 K/uL Final  . Eosinophils Relative 04/12/2018 0  % Final  . Eosinophils Absolute 04/12/2018 0.0  0 - 0.7 K/uL Final  . Basophils Relative 04/12/2018 1  % Final  . Basophils Absolute 04/12/2018 0.0  0 - 0.1 K/uL Final   Performed at [facility], [address] [place], [place] [address]  . Uric Acid, Serum 04/12/2018 7.2* 2.5 - 7.1 mg/dL Final   Comment: Please note change in reference range. Performed at [facility], [address], [place], [place] [address]     Assessment:  DUSTINA SCOGGIN is a 84 y.o. 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 She presented with massive subcutaneous deposits, pulmonary nodules, involvement of left kidney, and bowel. Disease was unresponsive to Rituxan in 2014.   Chest, abdomen, and pelvic CT scan on 03/17/2016 revealed progression of diffuse lymphoma with subcutaneous deposits throughout the subcutaneous fat of the chest, abdomen,and pelvis. There were multiple nodular infiltrates throughout the lungs, likely due to lymphoma but could also represent infection or atypical infection. There was massive retroperitoneal lymphadenopathy with diffuse retroperitoneal, mesenteric, and pelvic lymphadenopathy. There was wall thickening and pelvicsmall bowel probably representing lymphomas involvement. There was probable direct invasion of the left kidney.  Biopsy of the dominant SQ nodal conglomeration around the right flank on 03/18/2016 revealed a persistent low-grade CD5  positive B-cell lymphoma.  Flow cytometry was positive for CD20, CD79a, CD5, CD138. Neoplastic cells were positive for kappa and cyclin D1.  CD 23 was negative.   Additional molecular markers are pending.  Bone marrow aspirate and biopsy on 03/18/2016 revealed no evidence of lymphoma or plasma cell neoplasm (0.2% kappa monoclonal plasma cells by flow analysis).  Marrow was hypercellular for age (50-60%) with trilineage hematopoiesis and no increased blasts. There was mild to moderate widespread increase in reticulin fibers. There was no storage iron detected.  The following studies were abnormal:  Beta2-microglobulin was 9.6 (0.6-2.4).  Serum viscosity was 5.5 (1.6-1.9).  Uric acid was 11.1 on 03/19/2016 and 5.7 on 05/23/2016.     Normal studies included:  hepatitis B surface antigen, hepatitis B core antibody total, hepatitis C antibody, HIV testing, and LDH (123).  G6PD assay was 9.2 (4.6-13.5).   She has iron deficiency (ferritin 27; iron saturation 11%), B12 deficiency (147), and folate deficiency (5.9).  She has chronic renal insufficiency (BUN was 30-35, creatinine was 1.38 - 1.80 with a GFR 29-39 ml/min).  She began B12 injections on 03/28/2016 (last 01/08/2018).  She is on folic acid.  Folate was 77.5 on 05/09/2016, 42 on 03/13/2017, and 11.4 on 01/08/2018.   She received 1 week of chlorambucil with a tapering dose of prednisone (began 03/30/2016).  Cortisol was 9.5 on 05/17/2016.  She began ibrutinib on 04/26/2016.  She has mild thrombocytopenia (94,000).   SPEP has been followed:  5.0 gm/dL on 03/18/2016, 4.5 on 04/25/2016,  2.3 on 06/20/2016, 2.0 on 10/24/2016, 1.5 on 12/19/2016, 1.4 on 03/13/2017, 1.1 on 07/01/2017, and 1.3 on 01/08/2018. .  IgM has been followed:  > 5850 on 03/18/2016, > 5850 on 04/25/2016, 4652 on 06/09/2016, 3719 on 07/18/2016, 3429 on 10/24/2016, 2821 on 12/19/2016, 2295 on 03/13/2017, 2500 on  06/30/2017, 1890 09/29/2017, and 1769 on 01/08/2018.  Chest, abdomen, and  pelvic CT on 04/21/2016 revealed a partial response to therapy.  There were bilateral pulmonary nodules/masses, measuring up to 5.6 cm in the right lower lobe (improved).  There was trace left pleural effusion.  There was a 12.7 cm left renal/perirenal mass (decreased) with associated mild left hydronephrosis.  Retroperitoneal/left pelvic lymphadenopathy was mildly decreased.  Multifocal subcutaneous nodules, measuring up to 4.5 cm in the left lower anterior abdominal wall were stable to mildly improved.  Abdomen and pelvic CT on 12/28/2017 was concerning for small bowel obstruction with transitional zone at the distal ileum.  There was no associated mass.  LEFT renal mass measured 6.1 x 5.0 cm (previously 13.7 x 10.7 cm).  Suspect combination of mass and adenopathy due to lymphoma in the left adrenal. This area was much less pronounced than on prior study. There was a 1.7 x 1.2 cm lymph node in the RIGHT external iliac node chain.  Subcutaneous deposits bilaterally were considerably smaller compared to 2017.  There were sizable calculi (2.0 x 1.7 cm, 2.6 x 2.0 cm) in each kidney without obstruction.  Symptomatically, patient is doing well. She denies acute complaints today. Recent gout flare reported. She denies any B symptoms. No new areas of palpable adenopathy. Exam is unremarkable.WBC 3100 ([place] 1700). Platelets 84,000. BUN 43 and creatinine 1.61 (CrCl 27.1 mL/min). Uric acid elevated at 7.2.   Plan: 1. Labs today:  CBC with diff, CMP, SPEP, IgM, uric acid 2. Discuss elevated uric acid. Recent gout flare. Taking allopurinol 100 mg daily. Given renal insufficiency, dose was started low. ACR guidelines permit for gradual dose increase despite reduced renal function. Will increase dose to 200 mg daily and reassess at next RTC visit.  3. Continue ibrutinib 420 mg daily as prescribed 4. Follow-up today with oral chemotherapy pharmacist for specialty pharmacy routine follow up.  5. B12 injection today,  then continue monthly injections. 6. Schedule follow up CT of the abdomen and pelvis with contrast on 06/29/2018 for surveillance of LEFT renal mass and known adenopathy.  7. RTC in 3 months for MD assessment, labs (CBC with diff, CMP, SPEP, IgM,uric acid), B12 injection, and review of imaging.    Honor Loh, NP  04/12/2018, 11:19 AM

## 2018-04-12 ENCOUNTER — Telehealth: Payer: Self-pay | Admitting: Pharmacist

## 2018-04-12 ENCOUNTER — Inpatient Hospital Stay: Payer: Medicare HMO | Attending: Hematology and Oncology

## 2018-04-12 ENCOUNTER — Inpatient Hospital Stay: Payer: Medicare HMO

## 2018-04-12 ENCOUNTER — Inpatient Hospital Stay (HOSPITAL_BASED_OUTPATIENT_CLINIC_OR_DEPARTMENT_OTHER): Payer: Medicare HMO | Admitting: Urgent Care

## 2018-04-12 VITALS — BP 197/81 | HR 53 | Temp 97.4°F | Resp 18 | Wt 204.1 lb

## 2018-04-12 DIAGNOSIS — N2889 Other specified disorders of kidney and ureter: Secondary | ICD-10-CM | POA: Diagnosis not present

## 2018-04-12 DIAGNOSIS — E538 Deficiency of other specified B group vitamins: Secondary | ICD-10-CM

## 2018-04-12 DIAGNOSIS — R911 Solitary pulmonary nodule: Secondary | ICD-10-CM | POA: Insufficient documentation

## 2018-04-12 DIAGNOSIS — M10372 Gout due to renal impairment, left ankle and foot: Secondary | ICD-10-CM

## 2018-04-12 DIAGNOSIS — D696 Thrombocytopenia, unspecified: Secondary | ICD-10-CM

## 2018-04-12 DIAGNOSIS — E790 Hyperuricemia without signs of inflammatory arthritis and tophaceous disease: Secondary | ICD-10-CM

## 2018-04-12 DIAGNOSIS — E611 Iron deficiency: Secondary | ICD-10-CM

## 2018-04-12 DIAGNOSIS — C8300 Small cell B-cell lymphoma, unspecified site: Secondary | ICD-10-CM | POA: Diagnosis present

## 2018-04-12 DIAGNOSIS — D419 Neoplasm of uncertain behavior of unspecified urinary organ: Secondary | ICD-10-CM

## 2018-04-12 DIAGNOSIS — R635 Abnormal weight gain: Secondary | ICD-10-CM | POA: Diagnosis not present

## 2018-04-12 DIAGNOSIS — N189 Chronic kidney disease, unspecified: Secondary | ICD-10-CM | POA: Diagnosis not present

## 2018-04-12 LAB — CBC WITH DIFFERENTIAL/PLATELET
Basophils Absolute: 0 10*3/uL (ref 0–0.1)
Basophils Relative: 1 %
Eosinophils Absolute: 0 10*3/uL (ref 0–0.7)
Eosinophils Relative: 0 %
HCT: 38.7 % (ref 35.0–47.0)
Hemoglobin: 12.6 g/dL (ref 12.0–16.0)
Lymphocytes Relative: 26 %
Lymphs Abs: 0.8 10*3/uL — ABNORMAL LOW (ref 1.0–3.6)
MCH: 25.9 pg — ABNORMAL LOW (ref 26.0–34.0)
MCHC: 32.5 g/dL (ref 32.0–36.0)
MCV: 79.8 fL — ABNORMAL LOW (ref 80.0–100.0)
Monocytes Absolute: 0.5 10*3/uL (ref 0.2–0.9)
Monocytes Relative: 17 %
Neutro Abs: 1.7 10*3/uL (ref 1.4–6.5)
Neutrophils Relative %: 56 %
Platelets: 84 10*3/uL — ABNORMAL LOW (ref 150–440)
RBC: 4.85 MIL/uL (ref 3.80–5.20)
RDW: 16.3 % — ABNORMAL HIGH (ref 11.5–14.5)
WBC: 3.1 10*3/uL — ABNORMAL LOW (ref 3.6–11.0)

## 2018-04-12 LAB — URIC ACID: URIC ACID, SERUM: 7.2 mg/dL — AB (ref 2.5–7.1)

## 2018-04-12 LAB — COMPREHENSIVE METABOLIC PANEL
ALT: 14 U/L (ref 0–44)
AST: 16 U/L (ref 15–41)
Albumin: 3.4 g/dL — ABNORMAL LOW (ref 3.5–5.0)
Alkaline Phosphatase: 51 U/L (ref 38–126)
Anion gap: 9 (ref 5–15)
BUN: 43 mg/dL — ABNORMAL HIGH (ref 8–23)
CO2: 20 mmol/L — ABNORMAL LOW (ref 22–32)
Calcium: 9.2 mg/dL (ref 8.9–10.3)
Chloride: 108 mmol/L (ref 98–111)
Creatinine, Ser: 1.61 mg/dL — ABNORMAL HIGH (ref 0.44–1.00)
GFR calc Af Amer: 32 mL/min — ABNORMAL LOW (ref >60)
GFR calc non Af Amer: 28 mL/min — ABNORMAL LOW (ref >60)
Glucose, Bld: 114 mg/dL — ABNORMAL HIGH (ref 70–99)
Potassium: 4.2 mmol/L (ref 3.5–5.1)
Sodium: 137 mmol/L (ref 135–145)
Total Bilirubin: 0.6 mg/dL (ref 0.3–1.2)
Total Protein: 6.9 g/dL (ref 6.5–8.1)

## 2018-04-12 MED ORDER — CYANOCOBALAMIN 1000 MCG/ML IJ SOLN
1000.0000 ug | Freq: Once | INTRAMUSCULAR | Status: AC
  Administered 2018-04-12: 1000 ug via INTRAMUSCULAR
  Filled 2018-04-12: qty 1

## 2018-04-12 MED ORDER — ALLOPURINOL 100 MG PO TABS: 200.0000 mg | ORAL_TABLET | Freq: Every day | ORAL | 2 refills | Status: DC

## 2018-04-12 NOTE — Telephone Encounter (Signed)
Oral Chemotherapy Pharmacist Encounter  Follow-Up Form  Called patient today following her office visit to follow up regarding patient's oral chemotherapy medication: Imbruvica (ibrutinib)  Original Start date of oral chemotherapy: 04/2016  Pt reports 0 tablets/doses of ibrutinib missed in the last month.   Pt reports the following side effects: None reported  Recent labs reviewed: BP, elevated today, NP discussed with patient management  New medications?: None reported  Other Issues: None reported  Patient knows to call the office with questions or concerns. Oral Oncology Clinic will continue to follow.  Darl Pikes, PharmD, BCPS, [facility] Hematology/Oncology Clinical Pharmacist [facility] [phone]  04/12/2018 3:47 PM

## 2018-04-12 NOTE — Progress Notes (Signed)
Patient offers no complaints today regarding her lymphoma.

## 2018-04-13 LAB — PROTEIN ELECTROPHORESIS, SERUM
A/G Ratio: 1 (ref 0.7–1.7)
Albumin ELP: 3.2 g/dL (ref 2.9–4.4)
Alpha-1-Globulin: 0.3 g/dL (ref 0.0–0.4)
Alpha-2-Globulin: 0.6 g/dL (ref 0.4–1.0)
Beta Globulin: 0.9 g/dL (ref 0.7–1.3)
Gamma Globulin: 1.4 g/dL (ref 0.4–1.8)
Globulin, Total: 3.2 g/dL (ref 2.2–3.9)
M-Spike, %: 0.9 g/dL — ABNORMAL HIGH
Total Protein ELP: 6.4 g/dL (ref 6.0–8.5)

## 2018-04-13 LAB — IGM: IgM (Immunoglobulin M), Srm: 1244 mg/dL — ABNORMAL HIGH (ref 26–217)

## 2018-05-07 MED FILL — IMBRUVICA 420 MG TAB: 420 | 28 days supply | Qty: 28 | Fill #1

## 2018-05-10 ENCOUNTER — Inpatient Hospital Stay: Payer: Medicare HMO | Attending: Hematology and Oncology

## 2018-06-07 ENCOUNTER — Inpatient Hospital Stay: Payer: Medicare HMO | Attending: Hematology and Oncology

## 2018-06-07 MED FILL — IMBRUVICA 420 MG TAB: 420 | 28 days supply | Qty: 28 | Fill #2

## 2018-06-08 IMAGING — CT CT ABD-PELV W/ CM
3 of 5 series · 15 of 46 positions shown, 17 images · IV contrast (iopamidol)
Comparison: CT abdomen and pelvis 12/13/2014

CLINICAL DATA: Shortness of breath and right abdominal pain
radiating to the left side for a few weeks. History of lymphoma.

EXAM:
CT CHEST, ABDOMEN, AND PELVIS WITH CONTRAST
TECHNIQUE: Multidetector CT imaging of the chest, abdomen and pelvis was
performed following the standard protocol during bolus
administration of intravenous contrast.
CONTRAST:  75mL SETFTV-000 IOPAMIDOL (SETFTV-000) INJECTION 61%

[Series 2: cap with · axial · 0.85mm/px · z∈[-900,-376]mm · 11 of 127 slices shown, 13 images]
[im 11/127  soft-tissue]
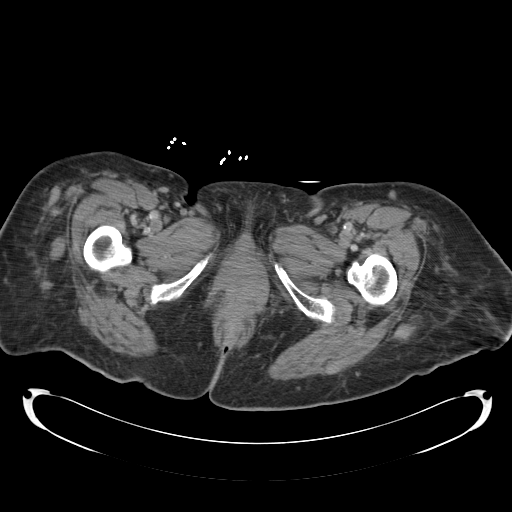
[im 11/127  bone]
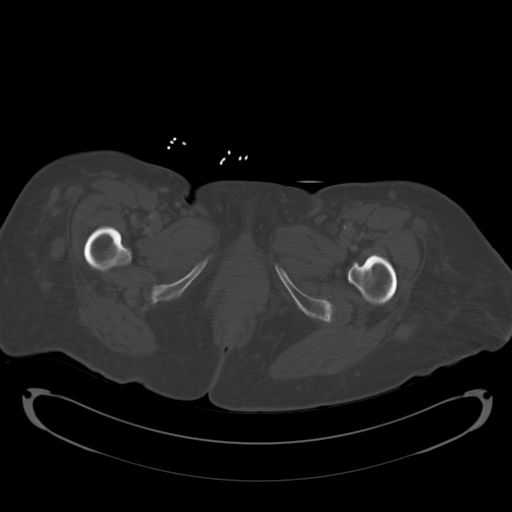
[im 22/127  soft-tissue]
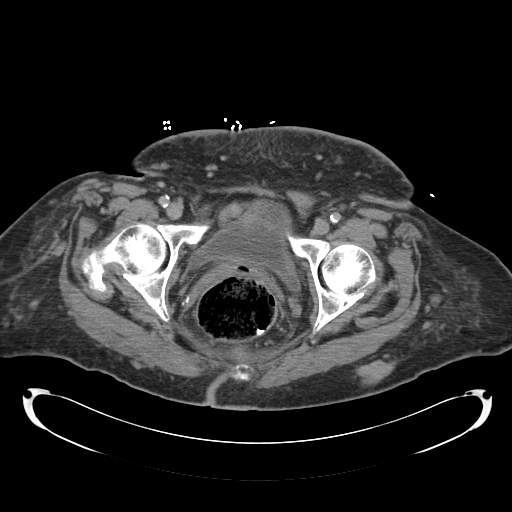
[im 32/127  soft-tissue]
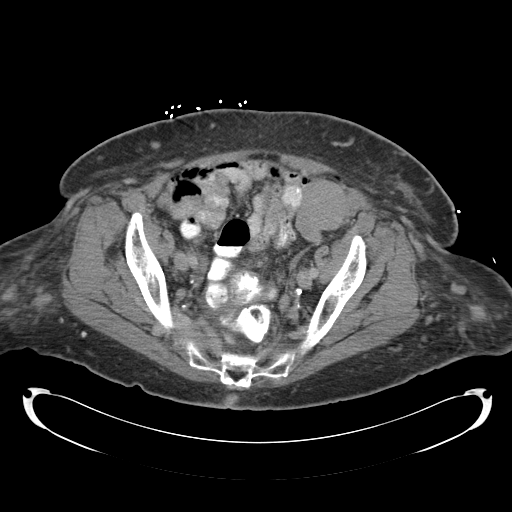
[im 43/127  soft-tissue]
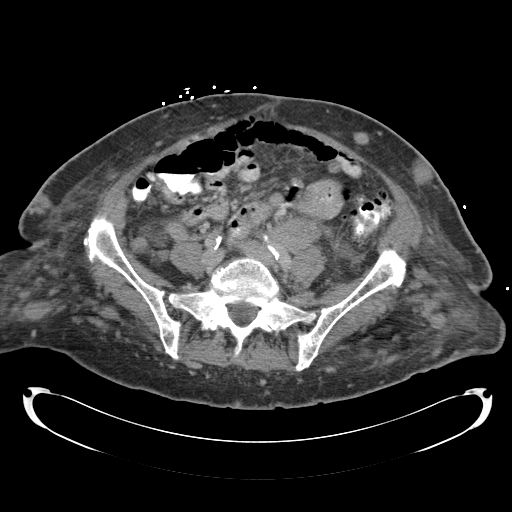
[im 53/127  soft-tissue]
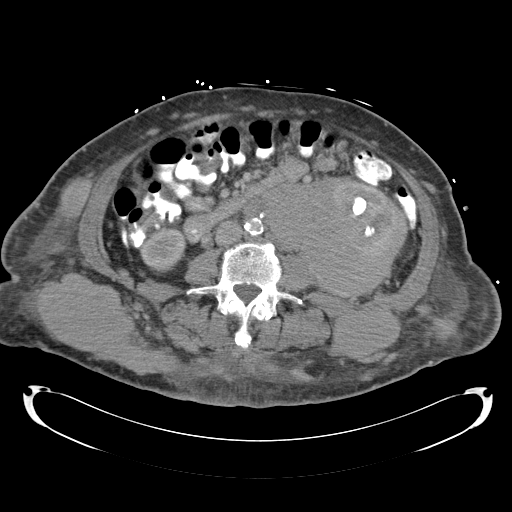
[im 64/127  soft-tissue]
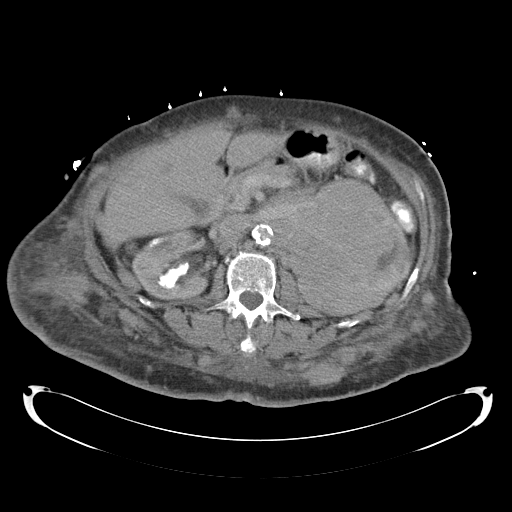
[im 74/127  soft-tissue]
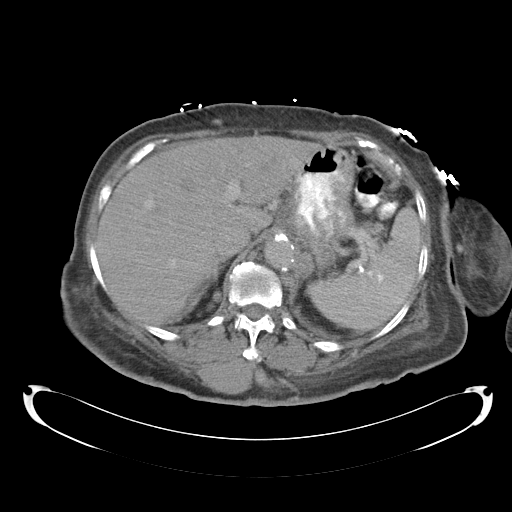
[im 85/127  soft-tissue]
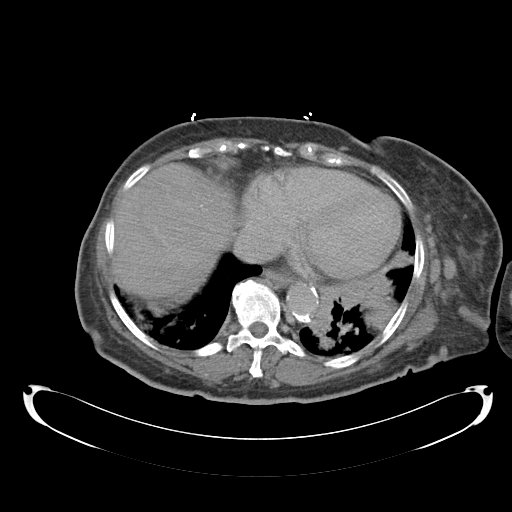
[im 95/127  soft-tissue]
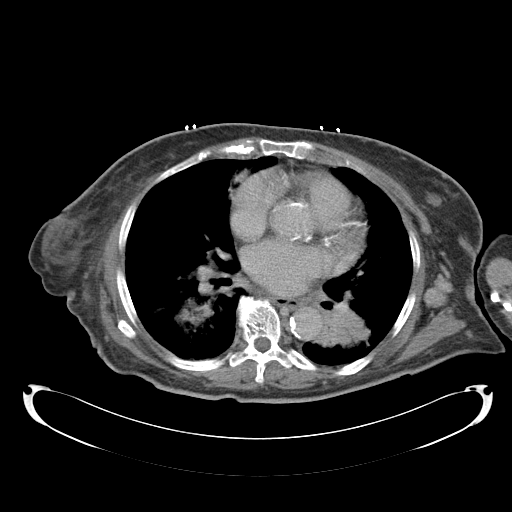
[im 95/127  bone]
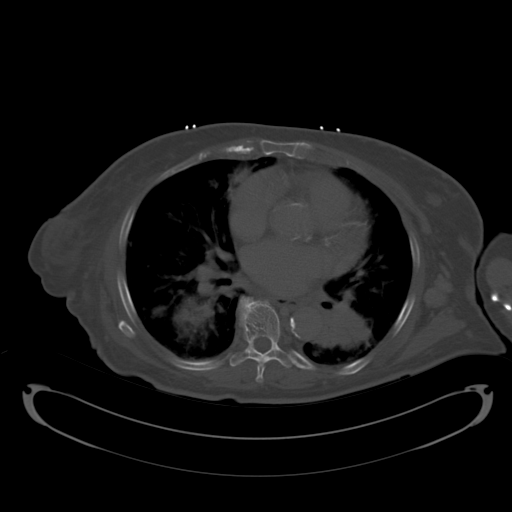
[im 106/127  soft-tissue]
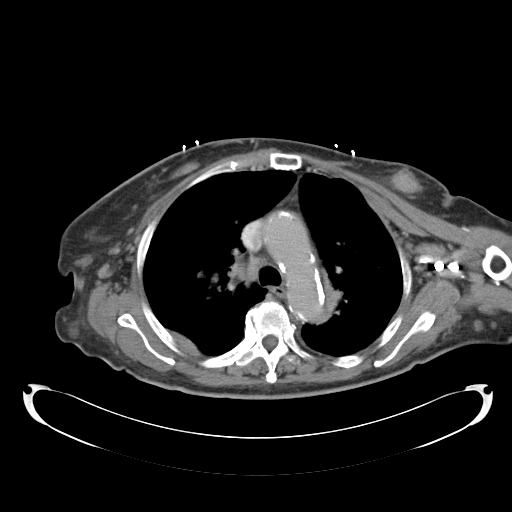
[im 116/127  soft-tissue]
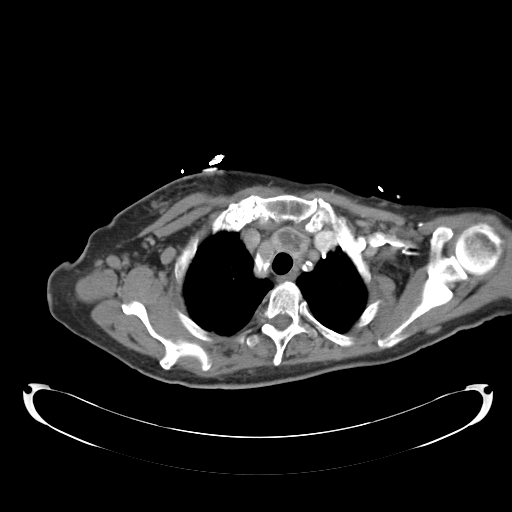

[Series 5: lung · axial · 0.85mm/px · 1 of 144 slices shown]
[im 11/144  bone]
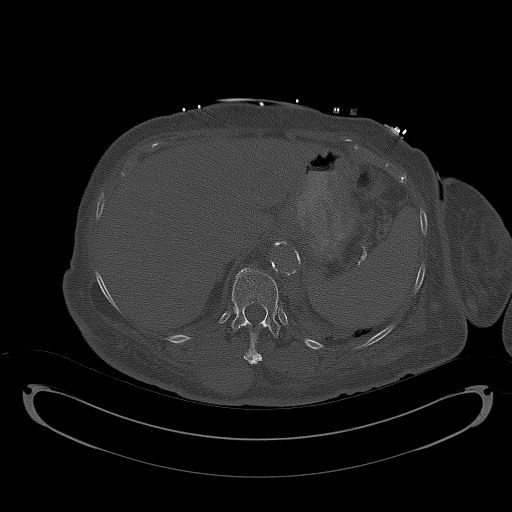

[Series 604: coronal mrp · coronal · 1.26mm/px · 3 of 133 slices shown]
[im 45/133  soft-tissue]
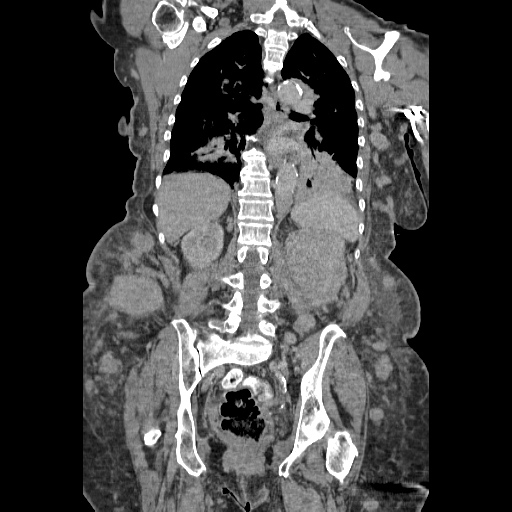
[im 74/133  soft-tissue]
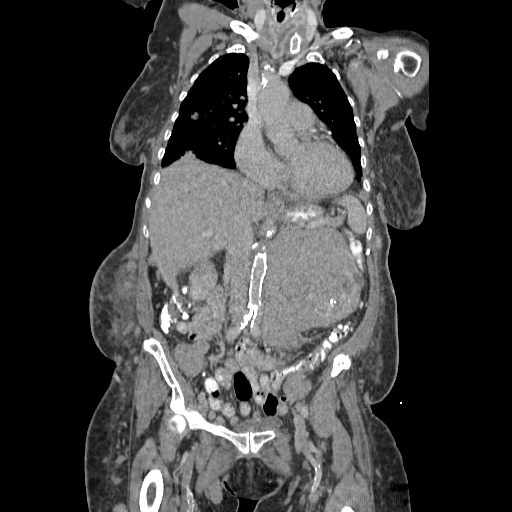
[im 89/133  soft-tissue]
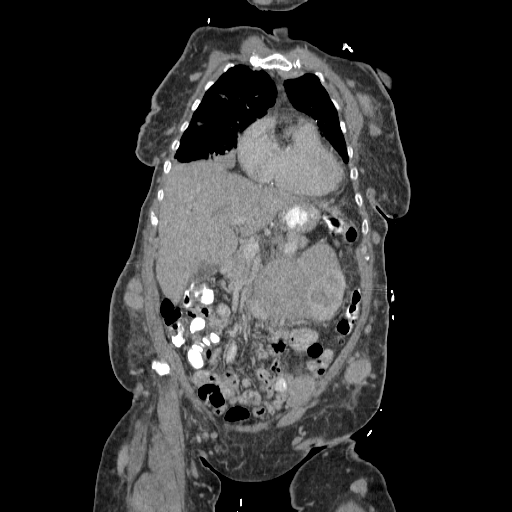

[15 of 46 positions shown; findings below may reference images not displayed]

FINDINGS: CT CHEST FINDINGS

Mediastinum/Lymph Nodes: Normal heart size. Coronary artery
calcifications. Normal caliber thoracic aorta. Esophagus is
decompressed. Enlarged and heterogeneous left thyroid nodule with
retrosternal extension.

Lungs/Pleura: Evaluation of lungs is limited due to motion artifact.
There are focal areas of masslike consolidation in the lung bases
with patchy nodular infiltrative lesions throughout both lungs.
There is significant progression since previous study. Changes could
be due 2 lymphoma, metastatic disease, or septic emboli with
multiple nodular areas of infection. Fungal infection could also
have this appearance.

Musculoskeletal: There soft tissue nodules demonstrated throughout
the subcutaneous fat of the chest wall with axillary and
supraclavicular lymphadenopathy. This likely represents
lymphoproliferative disorder with diffuse soft tissue lymphoma.
Similar findings are present on the previous study but there is
likely progression.

CT ABDOMEN PELVIS FINDINGS

Hepatobiliary: No masses or other significant abnormality.

Pancreas: No mass, inflammatory changes, or other significant
abnormality.

Spleen: Within normal limits in size and appearance.

Adrenals/Urinary Tract: Large stones demonstrated in both kidneys
without hydronephrosis.

Stomach/Bowel: Small esophageal hiatal hernia. Stomach is
decompressed. Small bowel are decompressed. Suggestion of small
bowel wall thickening in the pelvis likely to represent lymphomatous
involvement. No evidence of obstruction.

Vascular/Lymphatic: There are markedly enlarged lymph nodes
throughout the retroperitoneum, mesenteric, pelvis, and throughout
the subcutaneous fat. Massive retroperitoneal lymphadenopathy in the
left periaortic space causes displacement and deviation of the left
kidney with probable direct invasion of the left kidney. This mass
measures up to about 13.8 by 10.7 cm. There is progression since
previous study. Diffuse calcification of abdominal aorta without
aneurysm.

Reproductive: Uterus is surgically absent.

Other: None.

Musculoskeletal: No destructive bone lesions. Degenerative changes
in the spine.
IMPRESSION: Progression of diffuse lymphoma with subcutaneous deposits
demonstrated throughout the subcutaneous fat of the chest, abdomen,
and pelvis. Multiple nodular infiltrates throughout the lungs,
likely due to lymphoma but could also represent infection or
atypical infection. Massive retroperitoneal lymphadenopathy with
diffuse retroperitoneal, mesenteric, and pelvic lymphadenopathy
throughout. Wall thickening and pelvic small bowel probably
represents lymphomas involvement. Probable direct invasion of the
left kidney. Staghorn calculi in both kidneys.

## 2018-06-09 IMAGING — US US BIOPSY
1 series · 11 of 11 positions shown · non-contrast
Comparison: CT the chest, abdomen and pelvis - 03/17/2026

INDICATION: History of malignant lymphoplasmacytic lymphoma. Please perform
ultrasound-guided lymph node biopsy for tissue diagnostic purposes.

EXAM:
ULTRASOUND BIOPSY LYMPH NODE BIOPSY
TECHNIQUE: Informed written consent was obtained from the patient after a
discussion of the risks, benefits and alternatives to treatment.
Questions regarding the procedure were encouraged and answered.
Initial ultrasound scanning demonstrated a large mixed echogenic
solid mass within the subcutaneous tissues about the caudal lateral
aspect of the right flank compatible with the dominant nodal
conglomeration seen on preceding abdominal CT. An ultrasound image
was saved for documentation purposes. The procedure was planned. A
timeout was performed prior to the initiation of the procedure.

[Series 1: us biopsy · 0.09mm/px · 11 of 11 slices shown]
[im 1/11]
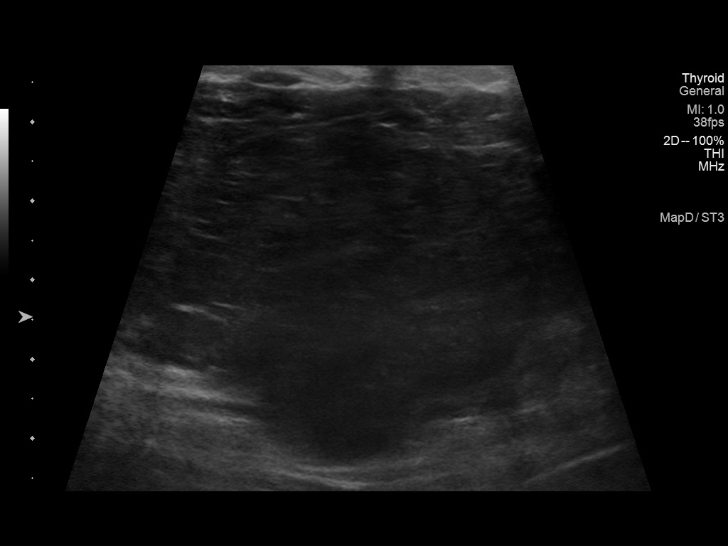
[im 2/11]
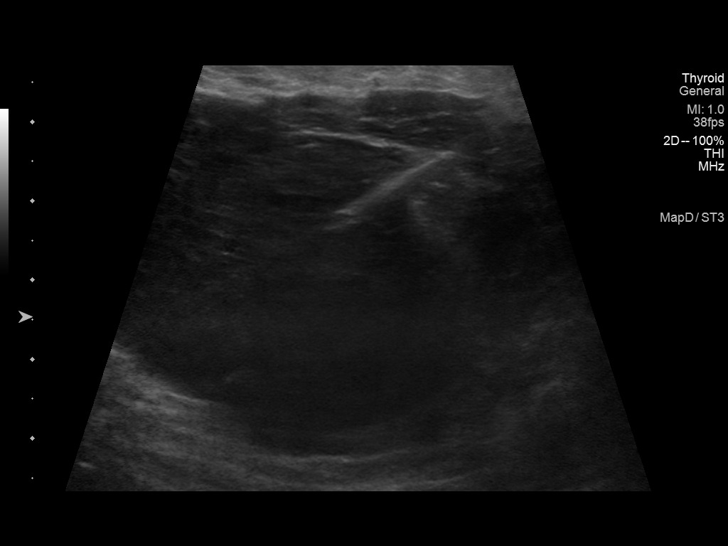
[im 3/11]
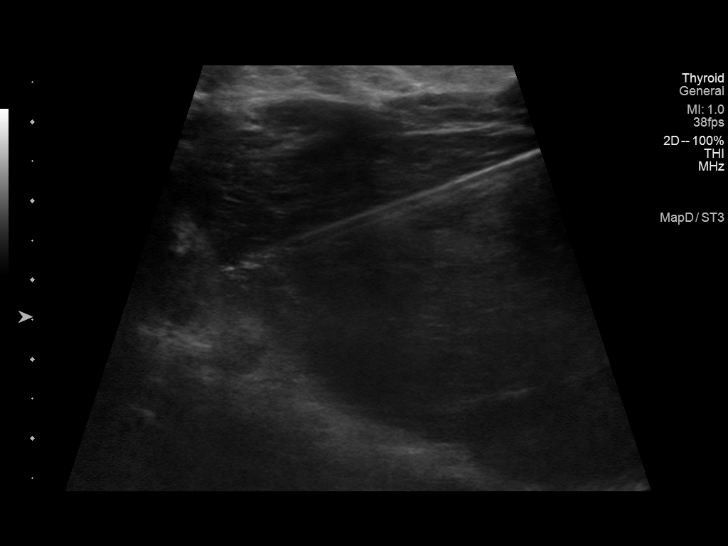
[im 4/11]
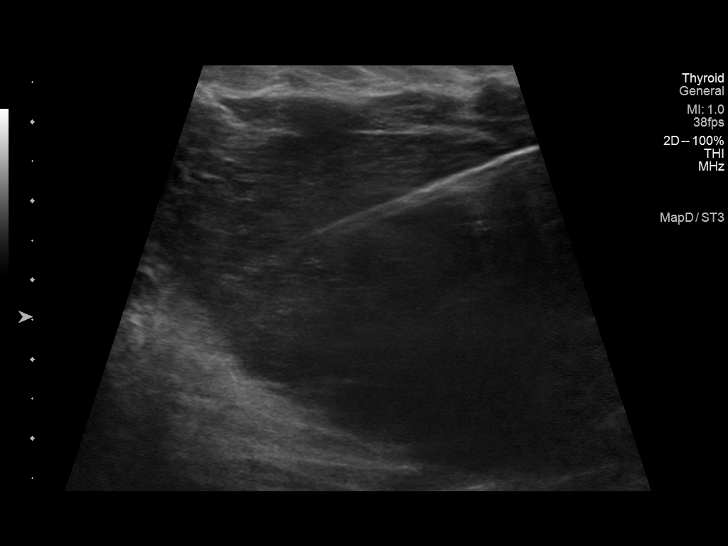
[im 5/11]
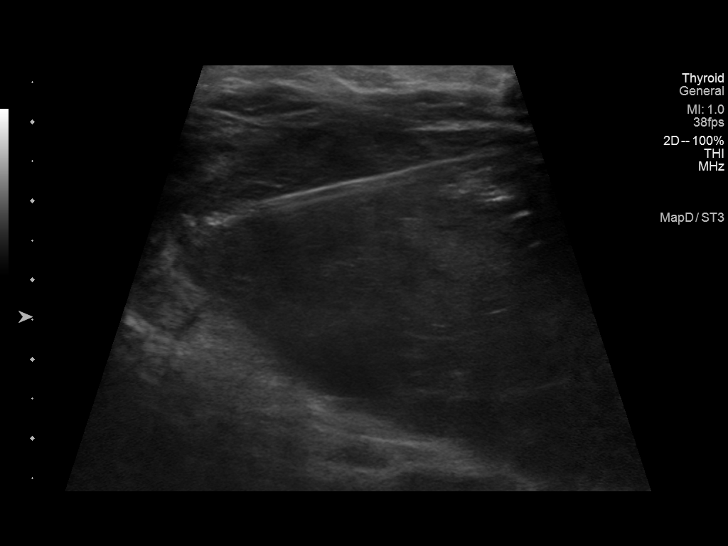
[im 6/11]
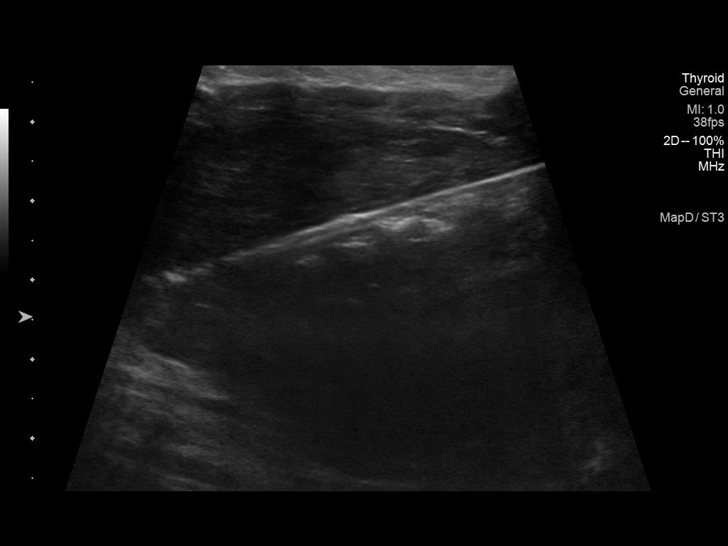
[im 7/11]
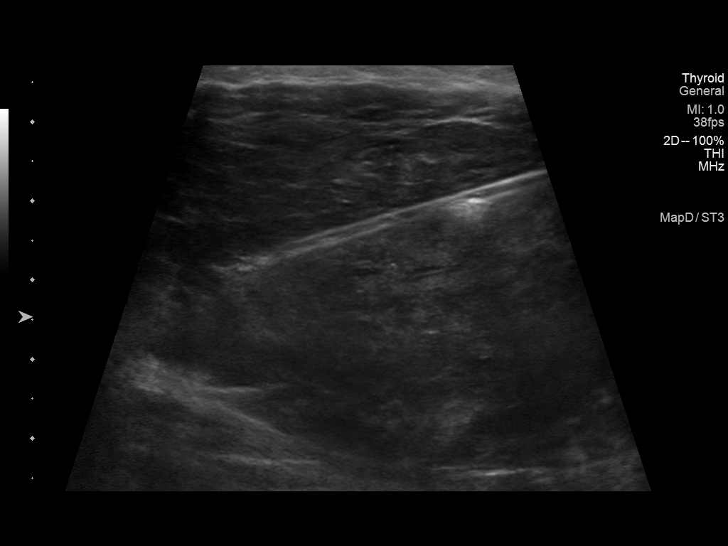
[im 8/11]
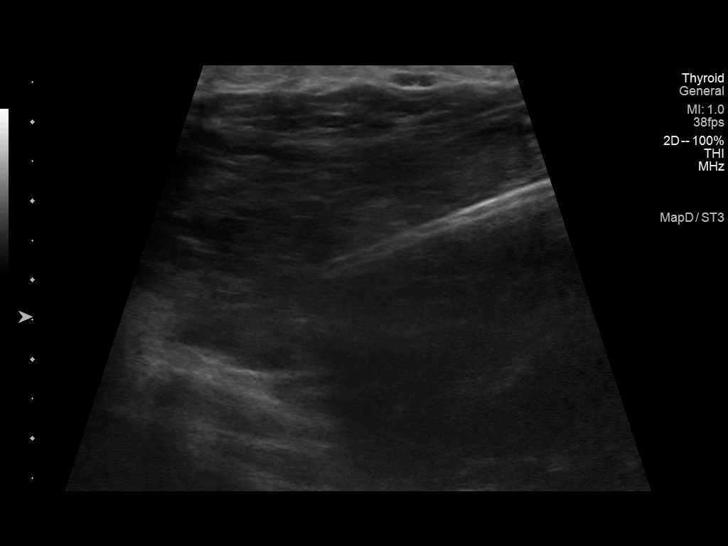
[im 9/11]
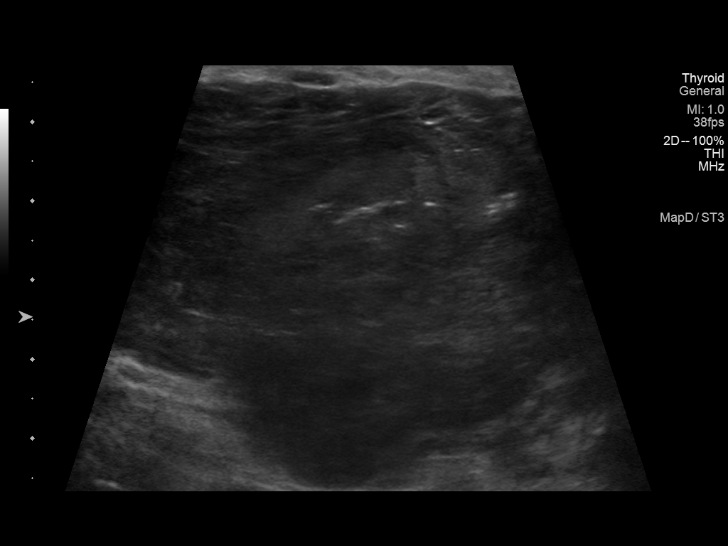
[im 10/11]
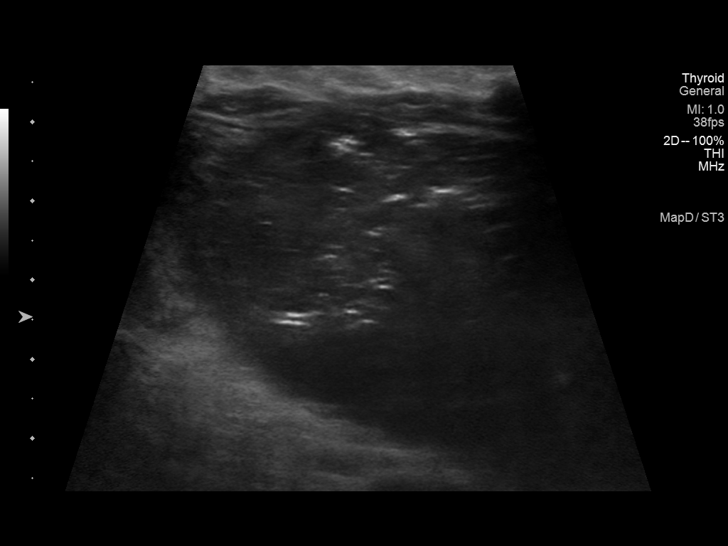
[im 11/11]
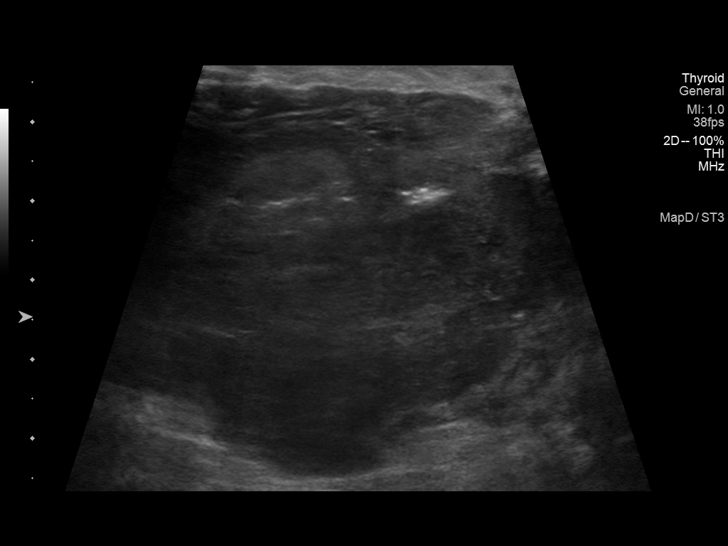

[11 of 11 positions shown; findings below may reference images not displayed]

MEDICATIONS:
None

ANESTHESIA/SEDATION:
Fentanyl 75 mcg IV; Versed 3 mg IV

Sedation Time: 25 minutes (total sedation time for both CT-guided
bone marrow biopsy and aspiration as well as subsequent
ultrasound-guided lymph node biopsy); The patient was continuously
monitored during the procedure by the interventional radiology nurse
under my direct supervision.

COMPLICATIONS:
None immediate.
The operative was prepped and draped in the usual sterile fashion,
and a sterile drape was applied covering the operative field. A
timeout was performed prior to the initiation of the procedure.
Local anesthesia was provided with 1% lidocaine with epinephrine.

Under direct ultrasound guidance, an 18 gauge core needle device was
utilized to obtain to obtain 6 core needle biopsies of the dominant
subcutaneous nodal conglomeration.

The samples were placed in saline and submitted to pathology. The
needle was removed and hemostasis was achieved with manual
compression. Post procedure scan was negative for significant
hematoma. A dressing was placed. The patient tolerated the procedure
well without immediate postprocedural complication.
IMPRESSION: Technically successful ultrasound guided biopsy of dominant
subcutaneous nodal conglomeration about the caudal aspect of the
right flank.

## 2018-06-18 ENCOUNTER — Ambulatory Visit: Payer: Medicare HMO | Admitting: Podiatry

## 2018-06-24 ENCOUNTER — Emergency Department: Payer: Medicare HMO

## 2018-06-24 ENCOUNTER — Other Ambulatory Visit: Payer: Self-pay

## 2018-06-24 ENCOUNTER — Emergency Department
Admission: EM | Admit: 2018-06-24 | Discharge: 2018-06-24 | Disposition: A | Discharge status: Home / Self Care | Payer: Medicare HMO | Attending: Emergency Medicine | Admitting: Emergency Medicine

## 2018-06-24 ENCOUNTER — Encounter: Payer: Self-pay | Admitting: Emergency Medicine

## 2018-06-24 DIAGNOSIS — Y999 Unspecified external cause status: Secondary | ICD-10-CM | POA: Diagnosis not present

## 2018-06-24 DIAGNOSIS — I10 Essential (primary) hypertension: Secondary | ICD-10-CM | POA: Insufficient documentation

## 2018-06-24 DIAGNOSIS — Y939 Activity, unspecified: Secondary | ICD-10-CM | POA: Insufficient documentation

## 2018-06-24 DIAGNOSIS — S80211A Abrasion, right knee, initial encounter: Secondary | ICD-10-CM | POA: Diagnosis not present

## 2018-06-24 DIAGNOSIS — M25561 Pain in right knee: Secondary | ICD-10-CM

## 2018-06-24 DIAGNOSIS — S8991XA Unspecified injury of right lower leg, initial encounter: Secondary | ICD-10-CM | POA: Diagnosis present

## 2018-06-24 DIAGNOSIS — W1843XA Slipping, tripping and stumbling without falling due to stepping from one level to another, initial encounter: Secondary | ICD-10-CM | POA: Insufficient documentation

## 2018-06-24 DIAGNOSIS — Z79899 Other long term (current) drug therapy: Secondary | ICD-10-CM | POA: Diagnosis not present

## 2018-06-24 DIAGNOSIS — Y929 Unspecified place or not applicable: Secondary | ICD-10-CM | POA: Diagnosis not present

## 2018-06-24 MED ORDER — BACITRACIN-NEOMYCIN-POLYMYXIN 400-5-5000 EX OINT
TOPICAL_OINTMENT | CUTANEOUS | Status: AC
  Administered 2018-06-24: 1
  Filled 2018-06-24: qty 1

## 2018-06-24 MED ORDER — BACITRACIN ZINC 500 UNIT/GM EX OINT: 1.0000 "application " | TOPICAL_OINTMENT | Freq: Once | CUTANEOUS | Status: DC

## 2018-06-24 MED ORDER — DICLOFENAC SODIUM 1 % TD GEL: 4.0000 g | Freq: Four times a day (QID) | TRANSDERMAL | 0 refills | Status: DC

## 2018-06-24 MED ORDER — DOUBLE ANTIBIOTIC 500-10000 UNIT/GM EX OINT
TOPICAL_OINTMENT | Freq: Once | CUTANEOUS | Status: DC
  Filled 2018-06-24 (×2): qty 1

## 2018-06-24 NOTE — ED Triage Notes (Signed)
Tripped on a step Wednesday landing on right knee. Abrasion noted. Still having pain so wanted to get checked out.  Ambulatory to triage. VSS

## 2018-06-24 NOTE — Discharge Instructions (Signed)
Please follow up with orthopedics if not improving over the week.  Put antibiotic ointment on your wound 2 times per day.  Rest, put ice on the knee for 20 minutes off and on throughout the day.

## 2018-06-24 NOTE — ED Notes (Addendum)
See triage note  States she tripped on step weds.  Landed on right knee  Ambulates with sl limp d/t pain  Positive swelling

## 2018-06-24 NOTE — ED Provider Notes (Signed)
[facility] Emergency Department Provider Note ____________________________________________  Time seen: Approximately 3:00 PM  I have reviewed the triage vital signs and the nursing notes.   HISTORY  Chief Complaint Knee Pain    HPI Kristy Solomon is a 84 y.o. female who presents to the emergency department for evaluation and treatment of right knee pain. She tripped on a step 4 days ago and continues to have pain. She reports having an abrasion and some swelling to the right knee. She has cleaned the wound with peroxide and applied antibiotic ointment, otherwise no alleviating measures.   Past Medical History:  Diagnosis Date  . 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 Hyperlipidemia   . Hypertension   . 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 Renal insufficiency     Patient Active Problem List   Diagnosis Date Noted  . Nephrolithiasis 01/11/2018  . Goals of care, counseling/discussion 12/28/2017  . Gastroenteritis 12/28/2017  . B12 deficiency 03/18/2016  . Folate deficiency 03/18/2016  . Iron deficiency anemia 03/18/2016  . Pneumonia 03/17/2016  . Pressure ulcer 03/17/2016  . Thrombocytopenia ([place]) 07/04/2014  . Acute renal failure ([place]) 07/04/2014  . Hyperuricemia 07/04/2014  . 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 Calculi, ureter 07/03/2012  . Frank hematuria 07/03/2012  . Hydronephrosis 07/03/2012  . 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 Calculus of kidney 07/02/2012  . Nonspecific finding on examination of urine 07/02/2012    Past Surgical History:  Procedure Laterality Date  . ABDOMINAL HYSTERECTOMY    . HEMORRHOID SURGERY      Prior to Admission medications   Medication Sig Start Date End Date Taking?  Authorizing Provider  allopurinol (ZYLOPRIM) 100 MG tablet Take 2 tablets (200 mg total) by mouth daily. 04/12/18   Karen Kitchens, NP  amLODipine-benazepril (LOTREL) 5-20 MG capsule Take 1 capsule by mouth daily.     [provider]  atorvastatin (LIPITOR) 20 MG tablet Take 20 mg by mouth daily. 12/24/13   [provider]  colchicine 0.6 MG tablet Take 0.6 mg by mouth as needed. For gout    [provider]  diclofenac sodium (VOLTAREN) 1 % GEL Apply 4 g topically 4 (four) times daily. 06/24/18   Garyn Waguespack, Johnette Abraham B, FNP  folic acid (FOLVITE) 1 MG tablet Take 1 tablet (1 mg total) by mouth daily. 01/08/18   Karen Kitchens, NP  furosemide (LASIX) 20 MG tablet Take 20 mg by mouth daily.    [provider]  gabapentin (NEURONTIN) 300 MG capsule Take 300 mg by mouth 3 (three) times daily.     [provider]  hydrALAZINE (APRESOLINE) 25 MG tablet Take 25 mg by mouth 3 (three) times daily.     [provider]  IMBRUVICA 420 MG TABS TAKE 1 TABLET (420 MG) BY MOUTH DAILY. 04/04/18   Karen Kitchens, NP  loperamide (IMODIUM A-D) 2 MG tablet Take 1 tablet (2 mg total) by mouth 4 (four) times daily as needed for diarrhea or loose stools. 12/29/17   Dustin Flock, MD  metoprolol tartrate (LOPRESSOR) 25 MG tablet Take 25 mg by mouth 2 (two) times daily. 03/05/16   [provider]  omeprazole (PRILOSEC) 20 MG capsule Take 20 mg by mouth daily. 03/02/16   [provider]    Allergies Patient has no  known allergies.  Family History  Problem Relation Age of Onset  . Cancer Brother        throat ca  . Cancer Brother        bone cancer    Social History Social History   Tobacco Use  . Smoking status: Never Smoker  . Smokeless tobacco: Never Used  Substance Use Topics  . Alcohol use: No  . Drug use: No    Review of Systems Constitutional: Negative for fever. Cardiovascular: Negative for chest pain. Respiratory: Negative for shortness of  breath. Musculoskeletal: Positive for right knee pain. Skin: Positive for abrasion to the right knee.  Neurological: Negative for decrease in sensation  ____________________________________________   PHYSICAL EXAM:  VITAL SIGNS: ED Triage Vitals  Enc Vitals Group     BP 06/24/18 1438 (!) 169/59     Pulse Rate 06/24/18 1438 (!) 51     Resp 06/24/18 1438 18     Temp 06/24/18 1438 98.7 F (37.1 C)     Temp Source 06/24/18 1438 Oral     SpO2 06/24/18 1438 100 %     Weight 06/24/18 1435 167 lb (75.8 kg)     Height 06/24/18 1435 5\' 3"  (1.6 m)     Head Circumference --      Peak Flow --      Pain Score 06/24/18 1435 6     Pain Loc --      Pain Edu? --      Excl. in [place]? --     Constitutional: Alert and oriented. Well appearing and in no acute distress. Eyes: Conjunctivae are clear without discharge or drainage Head: Atraumatic Neck: Supple Respiratory: No cough. Respirations are even and unlabored. Musculoskeletal: Right knee pain increases with full extension and attempt to flex. No deformity.  Neurologic: Motor and sensation intact  Skin: Abrasion to the prepatellar surface of the right knee.  Psychiatric: Affect and behavior are appropriate.  ____________________________________________   LABS (all labs ordered are listed, but only abnormal results are displayed)  Labs Reviewed - No data to display ____________________________________________  RADIOLOGY  Image of the right knee is reassuring. No acute abnormality identified by radiologist or myself. ____________________________________________   PROCEDURES  .Splint Application Date/Time: 05/12/1750 3:52 PM Performed by: Pernell Dupre, RN Authorized by: Victorino Dike, FNP   Consent:    Consent obtained:  Verbal   Consent given by:  Patient Procedure details:    Laterality:  Right   Location:  Knee   Supplies:  Elastic bandage Post-procedure details:    Pain:  Unchanged   Sensation:  Normal    Patient tolerance of procedure:  Tolerated well, no immediate complications    ____________________________________________   INITIAL IMPRESSION / ASSESSMENT AND PLAN / ED COURSE  Kristy Solomon is a 84 y.o. who presents to the emergency department for treatment and evaluation of right knee pain. She is able to ambulate but states that it is painful. Wound care completed while here. ACE bandage applied after x-ray was reassuring and negative for fracture and exam shows no laxity in the joint. She will be advised to apply voltaren gel on the knee, rest, ice, and follow up with orthopedics if not improving over the week. She is to return to the ER for symptoms that change or worsen or for new concerns.  Medications  neomycin-bacitracin-polymyxin (NEOSPORIN) [id number] ointment (1 application  Given 05/26/22 1548)    Pertinent labs & imaging results that were available during my  care of the patient were reviewed by me and considered in my medical decision making (see chart for details).  _________________________________________   FINAL CLINICAL IMPRESSION(S) / ED DIAGNOSES  Final diagnoses:  Acute pain of right knee  Abrasion, right knee, initial encounter    ED Discharge Orders         Ordered    diclofenac sodium (VOLTAREN) 1 % GEL  4 times daily     06/24/18 1559           If controlled substance prescribed during this visit, 12 month history viewed on the [facility] prior to issuing an initial prescription for Schedule II or III opiod.    Victorino Dike, FNP 06/24/18 1601    Carrie Mew, MD 06/24/18 1924

## 2018-06-29 ENCOUNTER — Ambulatory Visit: Admission: RE | Admit: 2018-06-29 | Payer: Medicare HMO | Source: Ambulatory Visit

## 2018-07-02 ENCOUNTER — Other Ambulatory Visit: Payer: Self-pay | Admitting: Urgent Care

## 2018-07-04 ENCOUNTER — Other Ambulatory Visit: Payer: Self-pay | Admitting: Urgent Care

## 2018-07-04 DIAGNOSIS — C8300 Small cell B-cell lymphoma, unspecified site: Secondary | ICD-10-CM

## 2018-07-06 ENCOUNTER — Inpatient Hospital Stay: Payer: Medicare HMO

## 2018-07-06 ENCOUNTER — Inpatient Hospital Stay: Payer: Medicare HMO | Attending: Hematology and Oncology

## 2018-07-06 ENCOUNTER — Inpatient Hospital Stay (HOSPITAL_BASED_OUTPATIENT_CLINIC_OR_DEPARTMENT_OTHER): Payer: Medicare HMO | Admitting: Hematology and Oncology

## 2018-07-06 ENCOUNTER — Other Ambulatory Visit: Payer: Self-pay

## 2018-07-06 ENCOUNTER — Other Ambulatory Visit: Payer: Self-pay | Admitting: Hematology and Oncology

## 2018-07-06 VITALS — BP 172/93 | HR 55 | Temp 96.4°F | Resp 18 | Wt 193.4 lb

## 2018-07-06 DIAGNOSIS — E790 Hyperuricemia without signs of inflammatory arthritis and tophaceous disease: Secondary | ICD-10-CM

## 2018-07-06 DIAGNOSIS — D419 Neoplasm of uncertain behavior of unspecified urinary organ: Secondary | ICD-10-CM

## 2018-07-06 DIAGNOSIS — N289 Disorder of kidney and ureter, unspecified: Secondary | ICD-10-CM | POA: Insufficient documentation

## 2018-07-06 DIAGNOSIS — M10372 Gout due to renal impairment, left ankle and foot: Secondary | ICD-10-CM

## 2018-07-06 DIAGNOSIS — Z9181 History of falling: Secondary | ICD-10-CM | POA: Diagnosis not present

## 2018-07-06 DIAGNOSIS — Z79899 Other long term (current) drug therapy: Secondary | ICD-10-CM | POA: Diagnosis not present

## 2018-07-06 DIAGNOSIS — M25561 Pain in right knee: Secondary | ICD-10-CM

## 2018-07-06 DIAGNOSIS — N189 Chronic kidney disease, unspecified: Secondary | ICD-10-CM | POA: Diagnosis not present

## 2018-07-06 DIAGNOSIS — C8300 Small cell B-cell lymphoma, unspecified site: Secondary | ICD-10-CM

## 2018-07-06 DIAGNOSIS — C880 Waldenstrom macroglobulinemia: Secondary | ICD-10-CM | POA: Diagnosis not present

## 2018-07-06 DIAGNOSIS — E538 Deficiency of other specified B group vitamins: Secondary | ICD-10-CM

## 2018-07-06 LAB — CBC WITH DIFFERENTIAL/PLATELET
BASOS ABS: 0.1 10*3/uL (ref 0–0.1)
BASOS PCT: 2 %
EOS ABS: 0 10*3/uL (ref 0–0.7)
Eosinophils Relative: 1 %
HEMATOCRIT: 43.9 % (ref 35.0–47.0)
HEMOGLOBIN: 14.1 g/dL (ref 12.0–16.0)
Lymphocytes Relative: 40 %
Lymphs Abs: 1.1 10*3/uL (ref 1.0–3.6)
MCH: 25.5 pg — ABNORMAL LOW (ref 26.0–34.0)
MCHC: 32.1 g/dL (ref 32.0–36.0)
MCV: 79.6 fL — ABNORMAL LOW (ref 80.0–100.0)
Monocytes Absolute: 0.4 10*3/uL (ref 0.2–0.9)
Monocytes Relative: 14 %
NEUTROS PCT: 43 %
Neutro Abs: 1.2 10*3/uL — ABNORMAL LOW (ref 1.4–6.5)
Platelets: 83 10*3/uL — ABNORMAL LOW (ref 150–440)
RBC: 5.51 MIL/uL — ABNORMAL HIGH (ref 3.80–5.20)
RDW: 16.5 % — AB (ref 11.5–14.5)
WBC: 2.8 10*3/uL — ABNORMAL LOW (ref 3.6–11.0)

## 2018-07-06 LAB — COMPREHENSIVE METABOLIC PANEL
ALT: 17 U/L (ref 0–44)
AST: 19 U/L (ref 15–41)
Albumin: 3.5 g/dL (ref 3.5–5.0)
Alkaline Phosphatase: 46 U/L (ref 38–126)
Anion gap: 7 (ref 5–15)
BILIRUBIN TOTAL: 0.9 mg/dL (ref 0.3–1.2)
BUN: 20 mg/dL (ref 8–23)
CALCIUM: 8.7 mg/dL — AB (ref 8.9–10.3)
CO2: 26 mmol/L (ref 22–32)
Chloride: 108 mmol/L (ref 98–111)
Creatinine, Ser: 1.4 mg/dL — ABNORMAL HIGH (ref 0.44–1.00)
GFR calc non Af Amer: 33 mL/min — ABNORMAL LOW (ref >60)
GFR, EST AFRICAN AMERICAN: 38 mL/min — AB (ref >60)
Glucose, Bld: 99 mg/dL (ref 70–99)
Potassium: 3.5 mmol/L (ref 3.5–5.1)
Sodium: 141 mmol/L (ref 135–145)
TOTAL PROTEIN: 7.1 g/dL (ref 6.5–8.1)

## 2018-07-06 LAB — URIC ACID: Uric Acid, Serum: 6.9 mg/dL (ref 2.5–7.1)

## 2018-07-06 LAB — FOLATE: Folate: 50.7 ng/mL (ref >5.9)

## 2018-07-06 MED ORDER — CYANOCOBALAMIN 1000 MCG/ML IJ SOLN
1000.0000 ug | Freq: Once | INTRAMUSCULAR | Status: AC
  Administered 2018-07-06: 1000 ug via INTRAMUSCULAR
  Filled 2018-07-06: qty 1

## 2018-07-06 NOTE — Progress Notes (Signed)
[facility] day:  07/06/18  Chief Complaint: Kristy Solomon is a 84 y.o. female with lymphoplasmacytic lymphoma who is seen for 3 month assessment on Ibrutinib.  HPI: The patient was last seen in the medical oncology clinic on 04/12/2018.  At that time, she was doing well. She denied acute complaints today. Recent gout flare was reported. She denied any B symptoms.  Exam revealed no adenopathy. WBC was 3100 ([place] 1700). Platelets were 84,000. BUN was 43 and creatinine 1.61 (CrCl 27.1 mL/min). Uric acid was 7.2.  She received B12.  She was scheduled for abdomen and pelvis CT with contrast on 06/29/2018 for surveillance of LEFT renal mass and known adenopathy.  She cancelled the appointment due not wanting to drink the contrast. Patient states, "I needed to get my mind right for it. That hasn't happened yet. I am getting sick right now thinking about it".   She was seen in the [facility] ER on 06/24/2018 after tripping on steps and landing on her right knee.  Knee films revealed no fracture.   During the interim, patient continues to have RIGHT knee pain. She states, "I think I am going to go to the ER again after I leave here. They gave me some gel, but it ain't healing. I think I am going back". Patient denies any recurrent gout flares. She states, "My flares are my fault. I was eating a lot of sausage". She denies other complaints today. Patient denies that she has experienced any B symptoms. She denies any interval infections. She denies any new areas of palpable adenopathy.   Patient advises that she maintains an adequate appetite. She is eating well. Weight today is 193 lb 6.4 oz (87.7 kg), which compared to her last visit to the clinic, represents an 11 pound weight loss. Patient notes that she is trying to eat better.   Patient denies pain in the clinic today.   Past Medical History:  Diagnosis Date  . 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  Hyperlipidemia   . Hypertension   . 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 Renal insufficiency     Past Surgical History:  Procedure Laterality Date  . ABDOMINAL HYSTERECTOMY    . HEMORRHOID SURGERY      Family History  Problem Relation Age of Onset  . Cancer Brother        throat ca  . Cancer Brother        bone cancer    Social History:  reports that she has never smoked. She has never used smokeless tobacco. She reports that she does not drink alcohol or use drugs.  She lives in [place] with her grandson and his wife. Her emergency contact is Cecille Po, her grandaughter- [phone]).  Kristy Solomon phone number [phone]).  She lives in [place].  She has transportation issues.  She is alone today.  Allergies: No Known Allergies  Current Medications: Current Outpatient Medications  Medication Sig Dispense Refill  . allopurinol (ZYLOPRIM) 100 MG tablet Take 2 tablets (200 mg total) by mouth daily. 60 tablet 2  . amLODipine-benazepril (LOTREL) 5-20 MG capsule Take 1 capsule by mouth daily.     Marland Kitchen atorvastatin (LIPITOR) 20 MG tablet Take 20 mg by mouth daily.    . Calcium Citrate-Vitamin D 315-250 MG-UNIT TABS Take by mouth.    . colchicine 0.6 MG tablet Take 0.6 mg by mouth as needed. For gout  0  . diclofenac sodium (VOLTAREN) 1 %  GEL Apply 4 g topically 4 (four) times daily. 1 Tube 0  . folic acid (FOLVITE) 1 MG tablet TAKE 1 TABLET BY MOUTH ONCE DAILY 90 tablet 0  . furosemide (LASIX) 20 MG tablet Take 20 mg by mouth daily.    Marland Kitchen gabapentin (NEURONTIN) 300 MG capsule Take 300 mg by mouth 3 (three) times daily.     . hydrALAZINE (APRESOLINE) 25 MG tablet Take 25 mg by mouth 3 (three) times daily.     . IMBRUVICA 420 MG TABS TAKE 1 TABLET (420 MG) BY MOUTH DAILY. 28 tablet 2  . loperamide (IMODIUM A-D) 2 MG tablet Take 1 tablet (2 mg total) by mouth 4 (four) times daily as needed for diarrhea or loose stools. 30 tablet 0  . metoprolol tartrate  (LOPRESSOR) 25 MG tablet Take 25 mg by mouth 2 (two) times daily.  0  . omeprazole (PRILOSEC) 20 MG capsule Take 20 mg by mouth daily.  0   No current facility-administered medications for this visit.    Facility-Administered Medications Ordered in Other Visits  Medication Dose Route Frequency Provider Last Rate Last Dose  . cyanocobalamin ((VITAMIN B-12)) injection 1,000 mcg  1,000 mcg Intramuscular Once Lequita Asal, MD        Review of Systems  Constitutional: Positive for weight loss (down 11 pounds). Negative for diaphoresis, fever and malaise/fatigue.       "I am doing alright except for my knee. I think I am going back to the ER after I leave here".   HENT: Negative.   Eyes: Negative.   Respiratory: Negative for cough, hemoptysis, sputum production and shortness of breath.   Cardiovascular: Negative for chest pain, palpitations, orthopnea, leg swelling and PND.  Gastrointestinal: Negative for abdominal pain, blood in stool, constipation, diarrhea, melena, nausea and vomiting.  Genitourinary: Negative for dysuria, frequency, hematuria and urgency.  Musculoskeletal: Positive for joint pain (RIGHT knee pain s/p fall x 3 weeks ago). Negative for back pain, falls and myalgias.       PMH (+) for gout; denies recent flares  Skin: Negative for itching and rash.  Neurological: Negative for dizziness, tremors, weakness and headaches.  Endo/Heme/Allergies: Does not bruise/bleed easily.  Psychiatric/Behavioral: Negative for depression, memory loss and suicidal ideas. The patient is not nervous/anxious and does not have insomnia.   All other systems reviewed and are negative.  Performance status (ECOG): 2 - Symptomatic, <50% confined to bed  Vital signs BP (!) 172/93 (BP Location: Left Arm, Patient Position: Sitting)   Pulse (!) 55   Temp (!) 96.4 F (35.8 C) (Tympanic)   Resp 18   Wt 193 lb 6.4 oz (87.7 kg)   BMI 34.26 kg/m   Physical Exam  Constitutional: She is oriented to  person, place, and time and well-developed, well-nourished, and in no distress.  HENT:  Head: Normocephalic and atraumatic.  Mouth/Throat: Oropharynx is clear and moist and mucous membranes are normal.  Grey hair  Eyes: Pupils are equal, round, and reactive to light. EOM are normal. No scleral icterus.  Glasses. Brown eyes.   Neck: Normal range of motion. Neck supple. No tracheal deviation present. No thyromegaly present.  Cardiovascular: Normal rate, regular rhythm, normal heart sounds and intact distal pulses. Exam reveals no gallop and no friction rub.  No murmur heard. Pulmonary/Chest: Effort normal and breath sounds normal. No respiratory distress. She has no wheezes. She has no rales.  Abdominal: Soft. Bowel sounds are normal. She exhibits no distension. There is no tenderness.  Musculoskeletal: Normal range of motion. She exhibits no edema.       Right knee: She exhibits ecchymosis (abrasion). She exhibits no swelling, no effusion and normal alignment. Tenderness found.  Lymphadenopathy:    She has no cervical adenopathy.    She has no axillary adenopathy.       Right: No inguinal and no supraclavicular adenopathy present.       Left: No inguinal and no supraclavicular adenopathy present.  Neurological: She is alert and oriented to person, place, and time.  Skin: Skin is warm and dry. No rash noted. No erythema.  Psychiatric: Mood, affect and judgment normal.  Nursing note and vitals reviewed.   Appointment on 07/06/2018  Component Date Value Ref Range Status  . Sodium 07/06/2018 141  135 - 145 mmol/L Final  . Potassium 07/06/2018 3.5  3.5 - 5.1 mmol/L Final  . Chloride 07/06/2018 108  98 - 111 mmol/L Final  . CO2 07/06/2018 26  22 - 32 mmol/L Final  . Glucose, Bld 07/06/2018 99  70 - 99 mg/dL Final  . BUN 07/06/2018 20  8 - 23 mg/dL Final  . Creatinine, Ser 07/06/2018 1.40* 0.44 - 1.00 mg/dL Final  . Calcium 07/06/2018 8.7* 8.9 - 10.3 mg/dL Final  . Total Protein 07/06/2018  7.1  6.5 - 8.1 g/dL Final  . Albumin 07/06/2018 3.5  3.5 - 5.0 g/dL Final  . AST 07/06/2018 19  15 - 41 U/L Final  . ALT 07/06/2018 17  0 - 44 U/L Final  . Alkaline Phosphatase 07/06/2018 46  38 - 126 U/L Final  . Total Bilirubin 07/06/2018 0.9  0.3 - 1.2 mg/dL Final  . GFR calc non Af Amer 07/06/2018 33* >60 mL/min Final  . GFR calc Af Amer 07/06/2018 38* >60 mL/min Final   Comment: (NOTE) The eGFR has been calculated using the CKD EPI equation. This calculation has not been validated in all clinical situations. eGFR's persistently <60 mL/min signify possible Chronic Kidney Disease.   . Anion gap 07/06/2018 7  5 - 15 Final   Performed at [facility], [address] [place], [place] [address]  . WBC 07/06/2018 2.8* 3.6 - 11.0 K/uL Final  . RBC 07/06/2018 5.51* 3.80 - 5.20 MIL/uL Final  . Hemoglobin 07/06/2018 14.1  12.0 - 16.0 g/dL Final  . HCT 07/06/2018 43.9  35.0 - 47.0 % Final  . MCV 07/06/2018 79.6* 80.0 - 100.0 fL Final  . MCH 07/06/2018 25.5* 26.0 - 34.0 pg Final  . MCHC 07/06/2018 32.1  32.0 - 36.0 g/dL Final  . RDW 07/06/2018 16.5* 11.5 - 14.5 % Final  . Platelets 07/06/2018 83* 150 - 440 K/uL Final  . Neutrophils Relative % 07/06/2018 43  % Final  . Neutro Abs 07/06/2018 1.2* 1.4 - 6.5 K/uL Final  . Lymphocytes Relative 07/06/2018 40  % Final  . Lymphs Abs 07/06/2018 1.1  1.0 - 3.6 K/uL Final  . Monocytes Relative 07/06/2018 14  % Final  . Monocytes Absolute 07/06/2018 0.4  0.2 - 0.9 K/uL Final  . Eosinophils Relative 07/06/2018 1  % Final  . Eosinophils Absolute 07/06/2018 0.0  0 - 0.7 K/uL Final  . Basophils Relative 07/06/2018 2  % Final  . Basophils Absolute 07/06/2018 0.1  0 - 0.1 K/uL Final   Performed at [facility], [address] [place], [place] [address]    Assessment:  Jenavee F Elzey is a 84 y.o. 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 She presented   with massive subcutaneous deposits, pulmonary  nodules, involvement of left kidney, and bowel. Disease was unresponsive to Rituxan in 2014.   Chest, abdomen, and pelvic CT scan on 03/17/2016 revealed progression of diffuse lymphoma with subcutaneous deposits throughout the subcutaneous fat of the chest, abdomen,and pelvis. There were multiple nodular infiltrates throughout the lungs, likely due to lymphoma but could also represent infection or atypical infection. There was massive retroperitoneal lymphadenopathy with diffuse retroperitoneal, mesenteric, and pelvic lymphadenopathy. There was wall thickening and pelvicsmall bowel probably representing lymphomas involvement. There was probable direct invasion of the left kidney.  Biopsy of the dominant SQ nodal conglomeration around the right flank on 03/18/2016 revealed a persistent low-grade CD5 positive B-cell lymphoma.  Flow cytometry was positive for CD20, CD79a, CD5, CD138. Neoplastic cells were positive for kappa and cyclin D1.  CD 23 was negative.   Additional molecular markers are pending.  Bone marrow aspirate and biopsy on 03/18/2016 revealed no evidence of lymphoma or plasma cell neoplasm (0.2% kappa monoclonal plasma cells by flow analysis).  Marrow was hypercellular for age (50-60%) with trilineage hematopoiesis and no increased blasts. There was mild to moderate widespread increase in reticulin fibers. There was no storage iron detected.  The following studies were abnormal:  Beta2-microglobulin was 9.6 (0.6-2.4).  Serum viscosity was 5.5 (1.6-1.9).  Uric acid was 11.1 on 03/19/2016 and 5.7 on 05/23/2016.     Normal studies included:  hepatitis B surface antigen, hepatitis B core antibody total, hepatitis C antibody, HIV testing, and LDH (123).  G6PD assay was 9.2 (4.6-13.5).   She has iron deficiency (ferritin 27; iron saturation 11%), B12 deficiency (147), and folate deficiency (5.9).  She has chronic renal insufficiency (BUN was 30-35, creatinine was 1.38 - 1.80 with a GFR  29-39 ml/min).  She began B12 injections on 03/28/2016 (last 04/12/2018).  She is on folic acid.  Folate was 77.5 on 05/09/2016, 42 on 03/13/2017, and 11.4 on 01/08/2018.   She received 1 week of chlorambucil with a tapering dose of prednisone (began 03/30/2016).  Cortisol was 9.5 on 05/17/2016.  She began ibrutinib on 04/26/2016.  She has mild thrombocytopenia (94,000).   SPEP has been followed:  5.0 gm/dL on 03/18/2016, 4.5 on 04/25/2016,  2.3 on 06/20/2016, 2.0 on 10/24/2016, 1.5 on 12/19/2016, 1.4 on 03/13/2017, 1.1 on 07/01/2017, 1.3 on 01/08/2018, 0.9 on 04/12/2018, and 1.0 on 07/06/2018. .  IgM has been followed:  > 5850 on 03/18/2016, > 5850 on 04/25/2016, 4652 on 06/09/2016, 3719 on 07/18/2016, 3429 on 10/24/2016, 2821 on 12/19/2016, 2295 on 03/13/2017, 2500 on 06/30/2017, 1890 09/29/2017, 1769 on 01/08/2018, 1244 on 04/12/2018, and 1509 on 07/06/2018.  Chest, abdomen, and pelvic CT on 04/21/2016 revealed a partial response to therapy.  There were bilateral pulmonary nodules/masses, measuring up to 5.6 cm in the right lower lobe (improved).  There was trace left pleural effusion.  There was a 12.7 cm left renal/perirenal mass (decreased) with associated mild left hydronephrosis.  Retroperitoneal/left pelvic lymphadenopathy was mildly decreased.  Multifocal subcutaneous nodules, measuring up to 4.5 cm in the left lower anterior abdominal wall were stable to mildly improved.  Abdomen and pelvic CT on 12/28/2017 was concerning for small bowel obstruction with transitional zone at the distal ileum.  There was no associated mass.  LEFT renal mass measured 6.1 x 5.0 cm (previously 13.7 x 10.7 cm).  Suspect combination of mass and adenopathy due to lymphoma in the left adrenal. This area was much less pronounced than on prior study. There was a 1.7  x 1.2 cm lymph node in the RIGHT external iliac node chain.  Subcutaneous deposits bilaterally were considerably smaller compared to 2017.  There were sizable  calculi (2.0 x 1.7 cm, 2.6 x 2.0 cm) in each kidney without obstruction.  Symptomatically, patient is doing well overall. She complains of pain in her RIGHT knee s/p a fall x 3 weeks ago. Area of abrasion noted, with no signs of infections. Denies recent gout flares. Feels generally well. No new areas of palpable adenopathy. No B symptoms. Exam grossly unremarkable.  WBC 2800 ([place] 1200).  Platelets 83,000.  BUN 1.40 (CrCl 29.7 mL/min).  SPEP reveals a M spike of 1.0.  IgM level elevated at 1509 mg/dL.  Plan: 1. Labs today:  CBC with diff, CMP, SPEP, IgM, uric acid, folate. 2. Waldenstrom's macroglobulinemia  IgM 1509 mg/dL. M-spike 1.0.  No overt symptoms. No recent infections.   Continues ibrutinib as previously prescribed with no perceived side effects. 3. Chronic renal insufficiency  BUN 20 creatinine 1.40 (CrCl 29.7 mL/min).  Renal function is stable.  Encouraged patient to continue adequate hydration.  Continue routine lab monitoring. 4. Elevated uric acid  Uric acid has improved to 6.9 mg/dL.  No recent gout flares.  Continues on daily allopurinol 100 mg. 5. B12 and folate deficiency  Continue daily folate supplementation.  Continue monthly parenteral B12 supplementation.  Next injection due today. 6. LEFT renal mass  Patient was to have interval CT imaging of her chest, abdomen, and pelvis prior to this visit.  She did not attend scheduled radiology studies.  Discuss need for interval CT imaging of the chest, abdomen, and pelvis due to known renal mass and adenopathy.  Patient amenable.  Will schedule. 7. RIGHT knee pain  Previously seen in the ED following fall x3 weeks ago  Plain films demonstrated no acute bony abnormality.  Patient is not wearing compression bandage as directed.  Patient wanting to go back to the ER today, however she was encouraged to follow-up with her PCP for ongoing evaluation.  Continue Voltaren gel applications previously  prescribed. 8. RTC following CT scan for MD assessment and review of imaging results.   9. RTC in 1 month for labs (CBC with differential). 10. RTC in 3 months for MD assessment, labs (CBC with differential, CMP, SPEP, IgM, uric acid).   Honor Loh, NP    I saw and evaluated the patient, participating in the key portions of the service and reviewing pertinent diagnostic studies and records.  I reviewed the nurse practitioner's note and agree with the findings and the plan.  The assessment and plan were discussed with the patient.  Additional diagnostic studies of CT scans are needed to clarify disease status and would change the clinical management.  Several questions were asked by the patient and answered.   Nolon Stalls, MD 07/06/2018, 10:59 AM

## 2018-07-06 NOTE — Progress Notes (Signed)
Here for follow up. Stated she is doing" ok except for R knee ( half dollar size abrasion  noted)-tripped and fell on knee 3 w ago  -went to ER- cleansed and bandaged- per pt -then stated Im going tback to Er to get more medicine for it-its not healing "

## 2018-07-07 LAB — IGM: IgM (Immunoglobulin M), Srm: 1509 mg/dL — ABNORMAL HIGH (ref 26–217)

## 2018-07-09 ENCOUNTER — Telehealth: Payer: Self-pay | Admitting: Pharmacist

## 2018-07-09 LAB — PROTEIN ELECTROPHORESIS, SERUM
A/G Ratio: 1 (ref 0.7–1.7)
ALBUMIN ELP: 3.4 g/dL (ref 2.9–4.4)
ALPHA-1-GLOBULIN: 0.2 g/dL (ref 0.0–0.4)
Alpha-2-Globulin: 0.6 g/dL (ref 0.4–1.0)
Beta Globulin: 0.9 g/dL (ref 0.7–1.3)
GLOBULIN, TOTAL: 3.3 g/dL (ref 2.2–3.9)
Gamma Globulin: 1.6 g/dL (ref 0.4–1.8)
M-Spike, %: 1 g/dL — ABNORMAL HIGH
TOTAL PROTEIN ELP: 6.7 g/dL (ref 6.0–8.5)

## 2018-07-09 MED FILL — IMBRUVICA 420 MG TAB: 420 | 28 days supply | Qty: 28 | Fill #0

## 2018-07-09 NOTE — Telephone Encounter (Signed)
Oral Chemotherapy Pharmacist Encounter  Follow-Up Form  Called patient today to follow up regarding patient's oral chemotherapy medication: Imbruvica (ibrutinib)  Original Start date of oral chemotherapy: 04/2016  Pt reports 2 tablets/doses of ibrutinib missed recently because she ran out of medication. The pharmacy attempted to reach Ms. Kazanjian multiple times without success. Dennison Nancy was able to coordinate getting a refill from the pharmacy, she will have medication tomorrow 07/10/18.  Pt reports the following side effects: no relevant lab abnormalities  Recent labs reviewed: CBC from 07/06/18  New medications?: None reported  Other Issues: None reported  Patient knows to call the office with questions or concerns. Oral Oncology Clinic will continue to follow.  Darl Pikes, PharmD, BCPS, [facility] Hematology/Oncology Clinical Pharmacist [facility] [phone]  07/09/2018 11:43 AM

## 2018-07-11 ENCOUNTER — Ambulatory Visit
Admission: RE | Admit: 2018-07-11 | Discharge: 2018-07-11 | Disposition: A | Discharge status: Home / Self Care | Payer: Medicare HMO | Source: Ambulatory Visit | Attending: Hematology and Oncology | Admitting: Hematology and Oncology

## 2018-07-11 DIAGNOSIS — I700 Atherosclerosis of aorta: Secondary | ICD-10-CM | POA: Insufficient documentation

## 2018-07-11 DIAGNOSIS — R222 Localized swelling, mass and lump, trunk: Secondary | ICD-10-CM | POA: Insufficient documentation

## 2018-07-11 DIAGNOSIS — C8300 Small cell B-cell lymphoma, unspecified site: Secondary | ICD-10-CM | POA: Diagnosis present

## 2018-07-11 DIAGNOSIS — R918 Other nonspecific abnormal finding of lung field: Secondary | ICD-10-CM | POA: Diagnosis not present

## 2018-07-12 ENCOUNTER — Telehealth: Payer: Self-pay | Admitting: *Deleted

## 2018-07-12 NOTE — Telephone Encounter (Signed)
Tried to call patient x4 and was unbale to reach her. I then called her  daughter Marzetta Merino) and she stated that her mother never had the  CT done and not to schedule anything until she calls the office back today.

## 2018-07-14 ENCOUNTER — Encounter: Payer: Self-pay | Admitting: Hematology and Oncology

## 2018-07-15 NOTE — Progress Notes (Signed)
[facility] day:  07/16/18  Chief Complaint: Kristy Solomon is a 84 y.o. 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  HPI: The patient was last seen in the medical oncology clinic on 07/06/2018.  At that time, patient complained on pain in her RIGHT knee following a mechanical fall x 3 weeks prior. No recurrent gout flares. Eating well, however she had unintentionally lost 11 pounds. Exam grossly unremarkable, with no appreciable LAD. WBC 2800 ([place] 1200). Platelets 83,000. BUN 1.40 (CrCl 29.7 mL/min). M-spike 1.0. IgM was elevated at 1509 mg/dL.   Interval non-contrast CT imaging of the chest, abdomen, and pelvis done on 07/11/2018 that demonstrated the following pertinent findings: 1. Hypodense 3.6 cm inferior LEFT thyroid nodule (stable).  2. New subpleural medial 0.8 cm apical RUL nodule. 3. New subpleural anterior 0.5 cm LUL nodule.  4. Basilar LLL mass measuring 4.6 x 3.3 cm (previously 4.2 x 3.2 cm) 5. Basilar RLL nodule measuring 1.4 cm (previously 0.6 cm).  6. LEFT upper breast mass measuring 2.4 x 2.0 cm (previously 3.8 x 2.1 cm).  7. Superficial RIGHT chest wall masses measuring up to 2.3 x 0.9 cm (previously 4.1 x 1.6 cm) 8. Several bulky stones in the RIGHT renal pelvis measuring up to 22 x 11 mm. 9. Non-obstructing 13 mm LEFT renal stone.  10. Coarsely calcified 2.3 cm anterior interpolar RIGHT renal cortical lesion. 11. Simple 1.8 cm anterior RIGHT renal cyst. 12. Infiltrative soft tissue density, replacing the LEFT renal sinus and partially encasing the kidney/vascular pedicle, spans up to 10.2 x 6.5 cm.  In the interim, patient has been feeling "ok". She denies any acute concerns today. She denies any chest pain or increased shortness of breath. Patient denies that she has experienced any B symptoms. She denies any interval infections.   Patient advises that  she maintains an adequate appetite. She is eating well. Weight today is 203 lb 1.6 oz (92.1 kg).  Patient denies pain in the clinic today.   Past Medical History:  Diagnosis Date  . 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 Hyperlipidemia   . Hypertension   . Malignant lymphoma, lymphoplasmacytic ([place]) 12/27/2013  . Renal insufficiency     Past Surgical History:  Procedure Laterality Date  . ABDOMINAL HYSTERECTOMY    . HEMORRHOID SURGERY      Family History  Problem Relation Age of Onset  . Cancer Brother        throat ca  . Cancer Brother        bone cancer    Social History:  reports that she has never smoked. She has never used smokeless tobacco. She reports that she does not drink alcohol or use drugs.  She lives in [place] with her grandson and his wife. Her emergency contact is Cecille Po, her grandaughter- [phone]).  [name]'s phone number [phone]).  She lives in [place].  She has transportation issues.  She is alone today.  Allergies: No Known Allergies  Current Medications: Current Outpatient Medications  Medication Sig Dispense Refill  . allopurinol (ZYLOPRIM) 100 MG tablet Take 2 tablets (200 mg total) by mouth daily. 60 tablet 2  . amLODipine-benazepril (LOTREL) 5-20 MG capsule Take 1 capsule by mouth daily.     Marland Kitchen atorvastatin (LIPITOR) 20 MG tablet Take 20 mg by mouth daily.    . Calcium Citrate-Vitamin D 315-250 MG-UNIT TABS Take by mouth.    Marland Kitchen  colchicine 0.6 MG tablet Take 0.6 mg by mouth as needed. For gout  0  . folic acid (FOLVITE) 1 MG tablet TAKE 1 TABLET BY MOUTH ONCE DAILY 90 tablet 0  . furosemide (LASIX) 20 MG tablet Take 20 mg by mouth daily.    Marland Kitchen gabapentin (NEURONTIN) 300 MG capsule Take 300 mg by mouth 3 (three) times daily.     . hydrALAZINE (APRESOLINE) 25 MG tablet Take 25 mg by mouth 3 (three) times daily.     . IMBRUVICA 420 MG TABS TAKE 1 TABLET (420 MG) BY MOUTH DAILY. 28 tablet 2  . metoprolol tartrate (LOPRESSOR) 25 MG  tablet Take 25 mg by mouth 2 (two) times daily.  0  . diclofenac sodium (VOLTAREN) 1 % GEL Apply 4 g topically 4 (four) times daily. (Patient not taking: Reported on 07/16/2018) 1 Tube 0  . loperamide (IMODIUM A-D) 2 MG tablet Take 1 tablet (2 mg total) by mouth 4 (four) times daily as needed for diarrhea or loose stools. (Patient not taking: Reported on 07/16/2018) 30 tablet 0  . omeprazole (PRILOSEC) 20 MG capsule Take 20 mg by mouth daily.  0   No current facility-administered medications for this visit.    Facility-Administered Medications Ordered in Other Visits  Medication Dose Route Frequency Provider Last Rate Last Dose  . cyanocobalamin ((VITAMIN B-12)) injection 1,000 mcg  1,000 mcg Intramuscular Once Lequita Asal, MD        Review of Systems  Constitutional: Negative for diaphoresis, fever, malaise/fatigue and weight loss.  HENT: Negative.   Eyes: Negative.   Respiratory: Negative for cough, hemoptysis, sputum production and shortness of breath.   Cardiovascular: Negative for chest pain, palpitations, orthopnea, leg swelling and PND.  Gastrointestinal: Negative for abdominal pain, blood in stool, constipation, diarrhea, melena, nausea and vomiting.  Genitourinary: Negative for dysuria, frequency, hematuria and urgency.       (+) nephrolithiasis. (+) LEFT renal mass.   Musculoskeletal: Positive for joint pain (RIGHT knee pain s/p fall 3-4 weeks ago). Negative for back pain, falls and myalgias.       PMH (+) for gout; denies recent flares  Skin: Negative for itching and rash.  Neurological: Negative for dizziness, tremors, weakness and headaches.  Endo/Heme/Allergies: Does not bruise/bleed easily.  Psychiatric/Behavioral: Negative for depression, memory loss and suicidal ideas. The patient is not nervous/anxious and does not have insomnia.   All other systems reviewed and are negative.  Performance status (ECOG): 2 - Symptomatic, <50% confined to bed  Vital signs BP  (!) 169/90 (BP Location: Left Arm, Patient Position: Sitting)   Pulse (!) 57   Temp 98.7 F (37.1 C) (Tympanic)   Resp 18   Wt 203 lb 1.6 oz (92.1 kg)   BMI 35.98 kg/m   Physical Exam  Constitutional: She is oriented to person, place, and time and well-developed, well-nourished, and in no distress. No distress.  HENT:  Head: Normocephalic and atraumatic.  Mouth/Throat: Mucous membranes are normal.  Gray hair.  Eyes: Conjunctivae and EOM are normal. Right eye exhibits no discharge. Left eye exhibits no discharge. No scleral icterus.  Glasses. Brown eyes.   Cardiovascular: Bradycardia present.  Neurological: She is alert and oriented to person, place, and time. Gait normal.  Skin: She is not diaphoretic.  Psychiatric: Mood, affect and judgment normal.  Nursing note and vitals reviewed.   No visits with results within 3 Day(s) from this visit.  Latest known visit with results is:  Appointment on 07/06/2018  Component Date Value Ref Range Status  . Folate 07/06/2018 50.7  >5.9 ng/mL Final   Comment: RESULT CONFIRMED BY MANUAL DILUTION DAS Performed at [facility], [address] [place], [place] [address]   . Uric Acid, Serum 07/06/2018 6.9  2.5 - 7.1 mg/dL Final   Performed at [facility], [address] [place], [place] [address]  . IgM (Immunoglobulin M), Srm 07/06/2018 1,509* 26 - 217 mg/dL Final   Comment: (NOTE) Results confirmed on dilution. Performed At: [facility] [place], [place] [id number] Rush Farmer MD [id number]   . Total Protein ELP 07/06/2018 6.7  6.0 - 8.5 g/dL Final  . Albumin ELP 07/06/2018 3.4  2.9 - 4.4 g/dL Final  . Alpha-1-Globulin 07/06/2018 0.2  0.0 - 0.4 g/dL Final  . Alpha-2-Globulin 07/06/2018 0.6  0.4 - 1.0 g/dL Final  . Beta Globulin 07/06/2018 0.9  0.7 - 1.3 g/dL Final  . Gamma Globulin 07/06/2018 1.6  0.4 - 1.8 g/dL Final  . M-Spike, % 07/06/2018 1.0* Not Observed g/dL Final  . SPE  Interp. 07/06/2018 Comment   Final   Comment: (NOTE) The SPE pattern demonstrates a single peak (M-spike) in the gamma region which may represent monoclonal protein. This peak may also be caused by circulating immune complexes, cryoglobulins, C-reactive protein, fibrinogen or hemolysis.  If clinically indicated, the presence of a monoclonal gammopathy may be confirmed by immuno- fixation, as well as an evaluation of the urine for the presence of Bence-Jones protein. Performed At: [facility] [place], [place] [id number] Rush Farmer MD [id number]   . Comment 07/06/2018 Comment   Final   Comment: (NOTE) Protein electrophoresis scan will follow via computer, mail, or courier delivery.   Marland Kitchen GLOBULIN, TOTAL 07/06/2018 3.3  2.2 - 3.9 g/dL Corrected  . A/G Ratio 07/06/2018 1.0  0.7 - 1.7 Corrected  . Sodium 07/06/2018 141  135 - 145 mmol/L Final  . Potassium 07/06/2018 3.5  3.5 - 5.1 mmol/L Final  . Chloride 07/06/2018 108  98 - 111 mmol/L Final  . CO2 07/06/2018 26  22 - 32 mmol/L Final  . Glucose, Bld 07/06/2018 99  70 - 99 mg/dL Final  . BUN 07/06/2018 20  8 - 23 mg/dL Final  . Creatinine, Ser 07/06/2018 1.40* 0.44 - 1.00 mg/dL Final  . Calcium 07/06/2018 8.7* 8.9 - 10.3 mg/dL Final  . Total Protein 07/06/2018 7.1  6.5 - 8.1 g/dL Final  . Albumin 07/06/2018 3.5  3.5 - 5.0 g/dL Final  . AST 07/06/2018 19  15 - 41 U/L Final  . ALT 07/06/2018 17  0 - 44 U/L Final  . Alkaline Phosphatase 07/06/2018 46  38 - 126 U/L Final  . Total Bilirubin 07/06/2018 0.9  0.3 - 1.2 mg/dL Final  . GFR calc non Af Amer 07/06/2018 33* >60 mL/min Final  . GFR calc Af Amer 07/06/2018 38* >60 mL/min Final   Comment: (NOTE) The eGFR has been calculated using the CKD EPI equation. This calculation has not been validated in all clinical situations. eGFR's persistently <60 mL/min signify possible Chronic Kidney Disease.   Georgiann Hahn gap 07/06/2018 7  5 - 15 Final   Performed at [facility], [address] [place], [place] [address]  . WBC 07/06/2018 2.8* 3.6 - 11.0 K/uL Final  . RBC 07/06/2018 5.51* 3.80 - 5.20 MIL/uL Final  . Hemoglobin 07/06/2018 14.1  12.0 - 16.0 g/dL Final  . HCT 07/06/2018 43.9  35.0 -  47.0 % Final  . MCV 07/06/2018 79.6* 80.0 - 100.0 fL Final  . MCH 07/06/2018 25.5* 26.0 - 34.0 pg Final  . MCHC 07/06/2018 32.1  32.0 - 36.0 g/dL Final  . RDW 07/06/2018 16.5* 11.5 - 14.5 % Final  . Platelets 07/06/2018 83* 150 - 440 K/uL Final  . Neutrophils Relative % 07/06/2018 43  % Final  . Neutro Abs 07/06/2018 1.2* 1.4 - 6.5 K/uL Final  . Lymphocytes Relative 07/06/2018 40  % Final  . Lymphs Abs 07/06/2018 1.1  1.0 - 3.6 K/uL Final  . Monocytes Relative 07/06/2018 14  % Final  . Monocytes Absolute 07/06/2018 0.4  0.2 - 0.9 K/uL Final  . Eosinophils Relative 07/06/2018 1  % Final  . Eosinophils Absolute 07/06/2018 0.0  0 - 0.7 K/uL Final  . Basophils Relative 07/06/2018 2  % Final  . Basophils Absolute 07/06/2018 0.1  0 - 0.1 K/uL Final   Performed at [facility], [address], [place], [place] [address]    Assessment:  ANJALI MANZELLA is a 84 y.o. 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 She presented with massive subcutaneous deposits, pulmonary nodules, involvement of left kidney, and bowel. Disease was unresponsive to Rituxan in 2014.   Chest, abdomen, and pelvic CT scan on 03/17/2016 revealed progression of diffuse lymphoma with subcutaneous deposits throughout the subcutaneous fat of the chest, abdomen,and pelvis. There were multiple nodular infiltrates throughout the lungs, likely due to lymphoma but could also represent infection or atypical infection. There was massive retroperitoneal lymphadenopathy with diffuse retroperitoneal, mesenteric, and pelvic lymphadenopathy. There was wall thickening and pelvicsmall bowel probably representing lymphomas involvement. There was probable  direct invasion of the left kidney.  Biopsy of the dominant SQ nodal conglomeration around the right flank on 03/18/2016 revealed a persistent low-grade CD5 positive B-cell lymphoma.  Flow cytometry was positive for CD20, CD79a, CD5, CD138. Neoplastic cells were positive for kappa and cyclin D1.  CD 23 was negative.   Additional molecular markers are pending.  Bone marrow aspirate and biopsy on 03/18/2016 revealed no evidence of lymphoma or plasma cell neoplasm (0.2% kappa monoclonal plasma cells by flow analysis).  Marrow was hypercellular for age (50-60%) with trilineage hematopoiesis and no increased blasts. There was mild to moderate widespread increase in reticulin fibers. There was no storage iron detected.  The following studies were abnormal:  Beta2-microglobulin was 9.6 (0.6-2.4).  Serum viscosity was 5.5 (1.6-1.9).  Uric acid was 11.1 on 03/19/2016 and 5.7 on 05/23/2016.     Normal studies included:  hepatitis B surface antigen, hepatitis B core antibody total, hepatitis C antibody, HIV testing, and LDH (123).  G6PD assay was 9.2 (4.6-13.5).   She has iron deficiency (ferritin 27; iron saturation 11%), B12 deficiency (147), and folate deficiency (5.9).  She has chronic renal insufficiency (BUN was 30-35, creatinine was 1.38 - 1.80 with a GFR 29-39 ml/min).  She began B12 injections on 03/28/2016 (last 04/12/2018).  She is on folic acid.  Folate was 77.5 on 05/09/2016, 42 on 03/13/2017, and 11.4 on 01/08/2018.   She received 1 week of chlorambucil with a tapering dose of prednisone (began 03/30/2016).  Cortisol was 9.5 on 05/17/2016.  She began ibrutinib on 04/26/2016.  She has mild thrombocytopenia (94,000).   SPEP has been followed:  5.0 gm/dL on 03/18/2016, 4.5 on 04/25/2016,  2.3 on 06/20/2016, 2.0 on 10/24/2016, 1.5 on 12/19/2016, 1.4 on 03/13/2017, 1.1 on 07/01/2017, 1.3 on 01/08/2018, 0.9 on 04/12/2018, and 1.0 on 07/06/2018. Marland Kitchen  IgM has been followed:  > 5850 on 03/18/2016, > 5850 on  04/25/2016, 4652 on 06/09/2016, 3719 on 07/18/2016, 3429 on 10/24/2016, 2821 on 12/19/2016, 2295 on 03/13/2017, 2500 on 06/30/2017, 1890 09/29/2017, 1769 on 01/08/2018, 1244 on 04/12/2018, and 1509 on 07/06/2018.  Chest, abdomen, and pelvic CT on 04/21/2016 revealed a partial response to therapy.  There were bilateral pulmonary nodules/masses, measuring up to 5.6 cm in the right lower lobe (improved).  There was trace left pleural effusion.  There was a 12.7 cm left renal/perirenal mass (decreased) with associated mild left hydronephrosis.  Retroperitoneal/left pelvic lymphadenopathy was mildly decreased.  Multifocal subcutaneous nodules, measuring up to 4.5 cm in the left lower anterior abdominal wall were stable to mildly improved.  Abdomen and pelvic CT on 12/28/2017 was concerning for small bowel obstruction with transitional zone at the distal ileum.  There was no associated mass.  LEFT renal mass measured 6.1 x 5.0 cm (previously 13.7 x 10.7 cm).  Suspect combination of mass and adenopathy due to lymphoma in the left adrenal. This area was much less pronounced than on prior study. There was a 1.7 x 1.2 cm lymph node in the RIGHT external iliac node chain.  Subcutaneous deposits bilaterally were considerably smaller compared to 2017.  There were sizable calculi (2.0 x 1.7 cm, 2.6 x 2.0 cm) in each kidney without obstruction.  Chest, abdomen, and pelvic CT on 07/11/2018 revealed a hypodense 3.6 cm inferior LEFT thyroid nodule (stable), new subpleural medial 0.8 cm apical RUL nodule, new subpleural anterior 0.5 cm LUL nodule, basilar LLL mass measuring 4.6 x 3.3 cm (previously 4.2 x 3.2 cm) and a basilar RLL nodule measuring 1.4 cm (previously 0.6 cm). The LEFT upper breast mass measuring 2.4 x 2.0 cm (previously 3.8 x 2.1 cm). There were Superficial RIGHT chest wall masses measuring up to 2.3 x 0.9 cm (previously 4.1 x 1.6 cm). There were several bulky stones in the RIGHT renal pelvis measuring up to 22 x  11 mm, and a non-obstructing 13 mm LEFT renal stone. There was a coarsely calcified 2.3 cm anterior interpolar RIGHT renal cortical lesion, and a simple 1.8 cm anterior RIGHT renal cyst. The infiltrative soft tissue density, replacing the LEFT renal sinus and partially encasing the kidney/vascular pedicle, spanned up to 10.2 x 6.5 cm.   Symptomatically, she is doing well. She denies any acute concerns. No increased shortness of breath. No B symptoms or interval infections. Exam is stable.   Plan: 1. Review interval CT imaging of the chest, abdomen, and pelvis.  Imaging personally reviewed and felt to be consistent with the dictated radiology report. Imaging reviewed with patient.   Several acute and subacute findings in the chest, abdomen, and pelvis (mixed response). 2. Waldenstrm's macroglobulinemia  IgM 1509 mg/dL.  M spike 1.0.  No overt symptoms.  No recent infections.  Discuss need for change in therapy.  Reviewed available options, which include immunotherapy and intravenous chemotherapy.  Patient wishing to be considered for oral chemotherapeutic agent.   She notes that she is not interested in being treated with further IV interventions.    Discuss plan to reach out to academic center to discuss potential oral treatment options for this patient.  Patient wishes to consider clinical trial for oral therapy.  Will review with clinical research department to assess for potential trials that are currently available. 3. LEFT renal mass  Noted to be essentially stable in size; spans up to 10.2 x 6.5 cm.  Soft tissue mass partially  encases the kidney and renal vascular pedicle.  Etiology felt secondary to lymphoplasmacytic lymphoma/Waldenstrom's secondary to prior response to therapy. 4. Chronic renal insufficiency  BUN 20 and creatinine 1.40 (CrCl 29.7 mL/min).  Renal function is stable.  Patient encouraged to maintain adequate hydration.  Continue routine lab  monitoring. 5. Elevated uric acid  Uric acid improved to 6.9 mg/dL.  No recent gout flares  Continues on daily allopurinol 100 mg. 6. B12 and folate deficiency  Continue daily folate supplementation.  Continue monthly parenteral B12 supplementation (last injection 07/06/2018). 7. RTC in 1 month for labs (CBC with differential). 8. RTC in 3 months for MD assessment, labs (CBC with differential, CMP, SPEP, IgM, uric acid).   Honor Loh, NP  07/16/18, 12:11 PM  I saw and evaluated the patient, participating in the key portions of the service and reviewing pertinent diagnostic studies and records.  I reviewed the nurse practitioner's note and agree with the findings and the plan.  The assessment and plan were discussed with the patient.  Multiple questions were asked by the patient and answered.   Nolon Stalls, MD 07/16/18, 12:11 PM

## 2018-07-16 ENCOUNTER — Other Ambulatory Visit: Payer: Self-pay

## 2018-07-16 ENCOUNTER — Encounter: Payer: Self-pay | Admitting: Hematology and Oncology

## 2018-07-16 ENCOUNTER — Inpatient Hospital Stay (HOSPITAL_BASED_OUTPATIENT_CLINIC_OR_DEPARTMENT_OTHER): Payer: Medicare HMO | Admitting: Hematology and Oncology

## 2018-07-16 VITALS — BP 169/90 | HR 57 | Temp 98.7°F | Resp 18 | Wt 203.1 lb

## 2018-07-16 DIAGNOSIS — N189 Chronic kidney disease, unspecified: Secondary | ICD-10-CM | POA: Diagnosis not present

## 2018-07-16 DIAGNOSIS — E538 Deficiency of other specified B group vitamins: Secondary | ICD-10-CM

## 2018-07-16 DIAGNOSIS — C880 Waldenstrom macroglobulinemia: Secondary | ICD-10-CM | POA: Diagnosis not present

## 2018-07-16 DIAGNOSIS — N289 Disorder of kidney and ureter, unspecified: Secondary | ICD-10-CM | POA: Diagnosis not present

## 2018-07-16 DIAGNOSIS — E790 Hyperuricemia without signs of inflammatory arthritis and tophaceous disease: Secondary | ICD-10-CM

## 2018-07-16 DIAGNOSIS — C8300 Small cell B-cell lymphoma, unspecified site: Secondary | ICD-10-CM

## 2018-07-16 NOTE — Progress Notes (Signed)
Here today for follow up " I feel ok today" per pt.

## 2018-08-03 MED FILL — IMBRUVICA 420 MG TAB: 420 | 28 days supply | Qty: 28 | Fill #1

## 2018-08-06 ENCOUNTER — Inpatient Hospital Stay: Payer: Medicare HMO | Attending: Hematology and Oncology

## 2018-08-06 ENCOUNTER — Inpatient Hospital Stay: Payer: Medicare HMO

## 2018-08-06 DIAGNOSIS — E538 Deficiency of other specified B group vitamins: Secondary | ICD-10-CM | POA: Insufficient documentation

## 2018-08-06 DIAGNOSIS — C880 Waldenstrom macroglobulinemia: Secondary | ICD-10-CM | POA: Insufficient documentation

## 2018-08-06 DIAGNOSIS — C8300 Small cell B-cell lymphoma, unspecified site: Secondary | ICD-10-CM

## 2018-08-06 LAB — CBC WITH DIFFERENTIAL/PLATELET
Abs Immature Granulocytes: 0.04 10*3/uL (ref 0.00–0.07)
Basophils Absolute: 0.1 10*3/uL (ref 0.0–0.1)
Basophils Relative: 2 %
Eosinophils Absolute: 0 10*3/uL (ref 0.0–0.5)
Eosinophils Relative: 1 %
HCT: 43.1 % (ref 36.0–46.0)
Hemoglobin: 13 g/dL (ref 12.0–15.0)
Immature Granulocytes: 1 %
Lymphocytes Relative: 25 %
Lymphs Abs: 1.1 10*3/uL (ref 0.7–4.0)
MCH: 24.9 pg — ABNORMAL LOW (ref 26.0–34.0)
MCHC: 30.2 g/dL (ref 30.0–36.0)
MCV: 82.6 fL (ref 80.0–100.0)
Monocytes Absolute: 0.7 10*3/uL (ref 0.1–1.0)
Monocytes Relative: 15 %
Neutro Abs: 2.5 10*3/uL (ref 1.7–7.7)
Neutrophils Relative %: 56 %
Platelets: 91 10*3/uL — ABNORMAL LOW (ref 150–400)
RBC: 5.22 MIL/uL — ABNORMAL HIGH (ref 3.87–5.11)
RDW: 16.2 % — ABNORMAL HIGH (ref 11.5–15.5)
WBC: 4.4 10*3/uL (ref 4.0–10.5)
nRBC: 0 % (ref 0.0–0.2)

## 2018-08-06 MED ORDER — CYANOCOBALAMIN 1000 MCG/ML IJ SOLN
1000.0000 ug | Freq: Once | INTRAMUSCULAR | Status: AC
  Administered 2018-08-06: 1000 ug via INTRAMUSCULAR

## 2018-08-27 MED FILL — IMBRUVICA 420 MG TAB: 420 | 28 days supply | Qty: 28 | Fill #2

## 2018-09-03 ENCOUNTER — Inpatient Hospital Stay: Payer: Medicare HMO | Attending: Hematology and Oncology

## 2018-09-03 DIAGNOSIS — E538 Deficiency of other specified B group vitamins: Secondary | ICD-10-CM | POA: Diagnosis not present

## 2018-09-03 DIAGNOSIS — C880 Waldenstrom macroglobulinemia: Secondary | ICD-10-CM | POA: Diagnosis present

## 2018-09-03 DIAGNOSIS — N2889 Other specified disorders of kidney and ureter: Secondary | ICD-10-CM | POA: Insufficient documentation

## 2018-09-03 DIAGNOSIS — I129 Hypertensive chronic kidney disease with stage 1 through stage 4 chronic kidney disease, or unspecified chronic kidney disease: Secondary | ICD-10-CM | POA: Insufficient documentation

## 2018-09-03 DIAGNOSIS — N189 Chronic kidney disease, unspecified: Secondary | ICD-10-CM | POA: Insufficient documentation

## 2018-09-03 DIAGNOSIS — M25552 Pain in left hip: Secondary | ICD-10-CM | POA: Diagnosis not present

## 2018-09-03 MED ORDER — CYANOCOBALAMIN 1000 MCG/ML IJ SOLN
1000.0000 ug | Freq: Once | INTRAMUSCULAR | Status: AC
  Administered 2018-09-03: 1000 ug via INTRAMUSCULAR

## 2018-09-11 ENCOUNTER — Inpatient Hospital Stay (HOSPITAL_BASED_OUTPATIENT_CLINIC_OR_DEPARTMENT_OTHER): Payer: Medicare HMO | Admitting: Hematology and Oncology

## 2018-09-11 ENCOUNTER — Other Ambulatory Visit: Payer: Self-pay | Admitting: Hematology and Oncology

## 2018-09-11 ENCOUNTER — Inpatient Hospital Stay: Payer: Medicare HMO

## 2018-09-11 VITALS — BP 156/98 | HR 57 | Temp 98.1°F | Resp 18 | Wt 203.2 lb

## 2018-09-11 DIAGNOSIS — N289 Disorder of kidney and ureter, unspecified: Secondary | ICD-10-CM

## 2018-09-11 DIAGNOSIS — C880 Waldenstrom macroglobulinemia: Secondary | ICD-10-CM

## 2018-09-11 DIAGNOSIS — I10 Essential (primary) hypertension: Secondary | ICD-10-CM | POA: Diagnosis not present

## 2018-09-11 DIAGNOSIS — C8300 Small cell B-cell lymphoma, unspecified site: Secondary | ICD-10-CM

## 2018-09-11 DIAGNOSIS — E790 Hyperuricemia without signs of inflammatory arthritis and tophaceous disease: Secondary | ICD-10-CM

## 2018-09-11 DIAGNOSIS — Z7189 Other specified counseling: Secondary | ICD-10-CM

## 2018-09-11 DIAGNOSIS — M25552 Pain in left hip: Secondary | ICD-10-CM

## 2018-09-11 LAB — CBC WITH DIFFERENTIAL/PLATELET
Abs Immature Granulocytes: 0.01 10*3/uL (ref 0.00–0.07)
Basophils Absolute: 0.1 10*3/uL (ref 0.0–0.1)
Basophils Relative: 1 %
Eosinophils Absolute: 0 10*3/uL (ref 0.0–0.5)
Eosinophils Relative: 1 %
HCT: 43.4 % (ref 36.0–46.0)
Hemoglobin: 13.2 g/dL (ref 12.0–15.0)
Immature Granulocytes: 0 %
Lymphocytes Relative: 29 %
Lymphs Abs: 1.1 10*3/uL (ref 0.7–4.0)
MCH: 25.1 pg — ABNORMAL LOW (ref 26.0–34.0)
MCHC: 30.4 g/dL (ref 30.0–36.0)
MCV: 82.7 fL (ref 80.0–100.0)
Monocytes Absolute: 0.4 10*3/uL (ref 0.1–1.0)
Monocytes Relative: 10 %
Neutro Abs: 2.2 10*3/uL (ref 1.7–7.7)
Neutrophils Relative %: 59 %
Platelets: 92 10*3/uL — ABNORMAL LOW (ref 150–400)
RBC: 5.25 MIL/uL — ABNORMAL HIGH (ref 3.87–5.11)
RDW: 15.8 % — ABNORMAL HIGH (ref 11.5–15.5)
WBC: 3.8 10*3/uL — ABNORMAL LOW (ref 4.0–10.5)
nRBC: 0 % (ref 0.0–0.2)

## 2018-09-11 LAB — COMPREHENSIVE METABOLIC PANEL
ALT: 14 U/L (ref 0–44)
AST: 14 U/L — ABNORMAL LOW (ref 15–41)
Albumin: 3.5 g/dL (ref 3.5–5.0)
Alkaline Phosphatase: 42 U/L (ref 38–126)
Anion gap: 7 (ref 5–15)
BUN: 28 mg/dL — ABNORMAL HIGH (ref 8–23)
CO2: 25 mmol/L (ref 22–32)
Calcium: 8.9 mg/dL (ref 8.9–10.3)
Chloride: 107 mmol/L (ref 98–111)
Creatinine, Ser: 1.47 mg/dL — ABNORMAL HIGH (ref 0.44–1.00)
GFR calc Af Amer: 37 mL/min — ABNORMAL LOW (ref >60)
GFR calc non Af Amer: 32 mL/min — ABNORMAL LOW (ref >60)
Glucose, Bld: 99 mg/dL (ref 70–99)
Potassium: 3.7 mmol/L (ref 3.5–5.1)
Sodium: 139 mmol/L (ref 135–145)
Total Bilirubin: 0.7 mg/dL (ref 0.3–1.2)
Total Protein: 6.9 g/dL (ref 6.5–8.1)

## 2018-09-11 LAB — URIC ACID: Uric Acid, Serum: 8.2 mg/dL — ABNORMAL HIGH (ref 2.5–7.1)

## 2018-09-11 MED ORDER — ALLOPURINOL 100 MG PO TABS: 200.0000 mg | ORAL_TABLET | Freq: Every day | ORAL | 1 refills | Status: DC

## 2018-09-11 NOTE — Progress Notes (Signed)
Patient's BP elevated today 174/112.  Patient states she has not taken her BP meds this morning.  She is also having pain in her left hip that started this morning 6/10.

## 2018-09-11 NOTE — Progress Notes (Signed)
[facility] day:  09/11/18  Chief Complaint: Kristy Solomon is a 84 y.o. female with lymphoplasmacytic lymphoma who is seen for a 1 month assessment on Ibrutinib.  HPI: The patient was last seen in the medical oncology clinic on 07/16/2018.  At that time, she was doing well. She denied any acute concerns.  She denied any increased shortness of breath. She denied any B symptoms or interval infections. Exam was stable.   CT scans revealed a mixed response.  Bilateral superficial chest wall masses had decreased in size since the chest CT of 04/21/2016.  The poorly marginated subsolid bilateral pulmonary nodules and masses had mildly increased.  The infiltrative left retroperitoneal soft tissue density replacing the left renal sinus and partially encasing the left kidney and left renal vascular pedicle was stable since 12/28/2017   We discussed a change in therapy. She declined IV treatment and only wanted to be consiidered for pills.  She continued ibrutinib.  During the interim, she has done well.  She denies any fevers, sweats or weight loss.  She denies any adenopathy, brusing or bleeding.  She denies any early satiety.  She denies any recurrence of cutaneous masses.  She notes new left hip pain today.  She denies any trauma.   Past Medical History:  Diagnosis Date  . 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 Hyperlipidemia   . Hypertension   . Malignant lymphoma, lymphoplasmacytic ([place]) 12/27/2013  . Renal insufficiency     Past Surgical History:  Procedure Laterality Date  . ABDOMINAL HYSTERECTOMY    . HEMORRHOID SURGERY      Family History  Problem Relation Age of Onset  . Cancer Brother        throat ca  . Cancer Brother        bone cancer    Social History:  reports that she has never smoked. She has never used smokeless tobacco. She reports that she does not drink alcohol or use drugs.  She lives in [place] with her grandson  and his wife. Her emergency contact is Cecille Po, her grandaughter- [phone]).  [name]'s phone number [phone]).  She lives in [place].  She has transportation issues.  She states that her grandson's wife can assist with travel (needs to be arranged).  She is alone today.  Allergies: No Known Allergies  Current Medications: Current Outpatient Medications  Medication Sig Dispense Refill  . allopurinol (ZYLOPRIM) 100 MG tablet Take 2 tablets (200 mg total) by mouth daily. 60 tablet 2  . amLODipine-benazepril (LOTREL) 5-20 MG capsule Take 1 capsule by mouth daily.     Marland Kitchen atorvastatin (LIPITOR) 20 MG tablet Take 20 mg by mouth daily.    . Calcium Citrate-Vitamin D 315-250 MG-UNIT TABS Take by mouth.    . colchicine 0.6 MG tablet Take 0.6 mg by mouth as needed. For gout  0  . folic acid (FOLVITE) 1 MG tablet TAKE 1 TABLET BY MOUTH ONCE DAILY 90 tablet 0  . furosemide (LASIX) 20 MG tablet Take 20 mg by mouth daily.    Marland Kitchen gabapentin (NEURONTIN) 300 MG capsule Take 300 mg by mouth 3 (three) times daily.     . hydrALAZINE (APRESOLINE) 25 MG tablet Take 25 mg by mouth 3 (three) times daily.     . IMBRUVICA 420 MG TABS TAKE 1 TABLET (420 MG) BY MOUTH DAILY. 28 tablet 2  . metoprolol tartrate (LOPRESSOR) 25 MG tablet Take 25 mg by mouth 2 (  two) times daily.  0  . omeprazole (PRILOSEC) 20 MG capsule Take 20 mg by mouth daily.  0  . diclofenac sodium (VOLTAREN) 1 % GEL Apply 4 g topically 4 (four) times daily. (Patient not taking: Reported on 07/16/2018) 1 Tube 0  . loperamide (IMODIUM A-D) 2 MG tablet Take 1 tablet (2 mg total) by mouth 4 (four) times daily as needed for diarrhea or loose stools. (Patient not taking: Reported on 07/16/2018) 30 tablet 0   No current facility-administered medications for this visit.    Facility-Administered Medications Ordered in Other Visits  Medication Dose Route Frequency Provider Last Rate Last Dose  . cyanocobalamin ((VITAMIN B-12)) injection 1,000 mcg   1,000 mcg Intramuscular Once Lequita Asal, MD        Review of Systems  Constitutional: Negative.  Negative for chills, diaphoresis, fever, malaise/fatigue and weight loss (stable).       Feels "fine".  HENT: Negative.  Negative for congestion, ear discharge, ear pain, nosebleeds, sinus pain, sore throat and tinnitus.   Eyes: Negative.  Negative for double vision, photophobia, pain, discharge and redness.  Respiratory: Negative.  Negative for cough, hemoptysis and sputum production.   Cardiovascular: Negative.  Negative for chest pain, palpitations, orthopnea and leg swelling.  Gastrointestinal: Negative.  Negative for abdominal pain, blood in stool, constipation, diarrhea, melena, nausea and vomiting.  Genitourinary: Negative for dysuria, frequency, hematuria and urgency.       Known nephrolithiasis- asymptomatic. (+) LEFT renal mass.   Musculoskeletal: Positive for joint pain (left hip pain- new today). Negative for back pain, falls and neck pain.       PMH (+) for gout; denies recent flares  Skin: Negative.  Negative for itching and rash.  Neurological: Negative.  Negative for dizziness, tingling, tremors, sensory change, speech change, focal weakness, weakness and headaches.  Endo/Heme/Allergies: Negative.  Does not bruise/bleed easily.  Psychiatric/Behavioral: Negative for depression and memory loss. The patient is not nervous/anxious and does not have insomnia.   All other systems reviewed and are negative.  Performance status (ECOG):1  Vital signs BP (!) 174/112 (BP Location: Left Arm, Patient Position: Sitting) Comment: Patient has not taken BP meds today  Pulse (!) 57   Temp 98.1 F (36.7 C) (Tympanic)   Resp 18   Wt 203 lb 4 oz (92.2 kg)   BMI 36.00 kg/m   Physical Exam  Constitutional: She is oriented to person, place, and time and well-developed, well-nourished, and in no distress. No distress.  HENT:  Head: Normocephalic and atraumatic.  Mouth/Throat:  Oropharynx is clear and moist and mucous membranes are normal. No oropharyngeal exudate.  Wearing a cap.  Gray hair.  Eyes: Pupils are equal, round, and reactive to light. Conjunctivae and EOM are normal. No scleral icterus.  Glasses. Brown eyes.   Neck: Normal range of motion. Neck supple. No JVD present.  Cardiovascular: Regular rhythm, normal heart sounds and intact distal pulses. Bradycardia present. Exam reveals no gallop.  No murmur heard. Pulmonary/Chest: Breath sounds normal. No respiratory distress. She has no wheezes. She has no rales.  Abdominal: Soft. Bowel sounds are normal. She exhibits no distension and no mass. There is no abdominal tenderness. There is no rebound and no guarding.  Musculoskeletal: Normal range of motion.        General: No tenderness or edema.  Lymphadenopathy:    She has no cervical adenopathy.  Neurological: She is alert and oriented to person, place, and time. Gait normal.  Skin: Skin is  warm and dry. No rash noted. She is not diaphoretic. No erythema. No pallor.  No palpable SQ masses.  Psychiatric: Mood, affect and judgment normal.  Nursing note and vitals reviewed.   No visits with results within 3 Day(s) from this visit.  Latest known visit with results is:  Appointment on 08/06/2018  Component Date Value Ref Range Status  . WBC 08/06/2018 4.4  4.0 - 10.5 K/uL Final  . RBC 08/06/2018 5.22* 3.87 - 5.11 MIL/uL Final  . Hemoglobin 08/06/2018 13.0  12.0 - 15.0 g/dL Final  . HCT 08/06/2018 43.1  36.0 - 46.0 % Final  . MCV 08/06/2018 82.6  80.0 - 100.0 fL Final  . MCH 08/06/2018 24.9* 26.0 - 34.0 pg Final  . MCHC 08/06/2018 30.2  30.0 - 36.0 g/dL Final  . RDW 08/06/2018 16.2* 11.5 - 15.5 % Final  . Platelets 08/06/2018 91* 150 - 400 K/uL Final   Comment: REPEATED TO VERIFY SPECIMEN CHECKED FOR CLOTS   . nRBC 08/06/2018 0.0  0.0 - 0.2 % Final  . Neutrophils Relative % 08/06/2018 56  % Final  . Neutro Abs 08/06/2018 2.5  1.7 - 7.7 K/uL Final  .  Lymphocytes Relative 08/06/2018 25  % Final  . Lymphs Abs 08/06/2018 1.1  0.7 - 4.0 K/uL Final  . Monocytes Relative 08/06/2018 15  % Final  . Monocytes Absolute 08/06/2018 0.7  0.1 - 1.0 K/uL Final  . Eosinophils Relative 08/06/2018 1  % Final  . Eosinophils Absolute 08/06/2018 0.0  0.0 - 0.5 K/uL Final  . Basophils Relative 08/06/2018 2  % Final  . Basophils Absolute 08/06/2018 0.1  0.0 - 0.1 K/uL Final  . Immature Granulocytes 08/06/2018 1  % Final  . Abs Immature Granulocytes 08/06/2018 0.04  0.00 - 0.07 K/uL Final   Performed at [facility], [address] [place], [place] [address]    Assessment:  ADDISYN LECLAIRE is a 84 y.o. 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 She presented with massive subcutaneous deposits, pulmonary nodules, involvement of left kidney, and bowel. Disease was unresponsive to Rituxan in 2014.   Chest, abdomen, and pelvic CT scan on 03/17/2016 revealed progression of diffuse lymphoma with subcutaneous deposits throughout the subcutaneous fat of the chest, abdomen,and pelvis. There were multiple nodular infiltrates throughout the lungs, likely due to lymphoma but could also represent infection or atypical infection. There was massive retroperitoneal lymphadenopathy with diffuse retroperitoneal, mesenteric, and pelvic lymphadenopathy. There was wall thickening and pelvic small bowel probably representing lymphomas involvement. There was probable direct invasion of the left kidney.  Biopsy of the dominant SQ nodal conglomeration around the right flank on 03/18/2016 revealed a persistent low-grade CD5 positive B-cell lymphoma.  Flow cytometry was positive for CD20, CD79a, CD5, CD138. Neoplastic cells were positive for kappa and cyclin D1.  CD 23 was negative.   Additional molecular markers are pending.  Bone marrow aspirate and biopsy on 03/18/2016 revealed no evidence of lymphoma or plasma cell neoplasm (0.2%  kappa monoclonal plasma cells by flow analysis).  Marrow was hypercellular for age (50-60%) with trilineage hematopoiesis and no increased blasts. There was mild to moderate widespread increase in reticulin fibers. There was no storage iron detected.  The following studies were abnormal:  Beta2-microglobulin was 9.6 (0.6-2.4).  Serum viscosity was 5.5 (1.6-1.9).  Uric acid was 11.1 on 03/19/2016 and 5.7 on 05/23/2016.     Normal studies included:  hepatitis B surface antigen, hepatitis B core antibody total, hepatitis C antibody, HIV  testing, and LDH (123).  G6PD assay was 9.2 (4.6-13.5).   She has iron deficiency (ferritin 27; iron saturation 11%), B12 deficiency (147), and folate deficiency (5.9).  She has chronic renal insufficiency (BUN was 30-35, creatinine was 1.38 - 1.80 with a GFR 29-39 ml/min).  She began B12 injections on 03/28/2016 (last 09/03/2018).  She is on folic acid.  Folate was 77.5 on 05/09/2016, 42 on 03/13/2017, and 11.4 on 01/08/2018.   She received 1 week of chlorambucil with a tapering dose of prednisone (began 03/30/2016).  Cortisol was 9.5 on 05/17/2016.  She began ibrutinib on 04/26/2016.  She has mild thrombocytopenia (94,000).   SPEP has been followed:  5.0 gm/dL on 03/18/2016, 4.5 on 04/25/2016,  2.3 on 06/20/2016, 2.0 on 10/24/2016, 1.5 on 12/19/2016, 1.4 on 03/13/2017, 1.1 on 07/01/2017, 1.3 on 01/08/2018, 0.9 on 04/12/2018, and 1.0 on 07/06/2018. .  IgM has been followed:  > 5850 on 03/18/2016, > 5850 on 04/25/2016, 4652 on 06/09/2016, 3719 on 07/18/2016, 3429 on 10/24/2016, 2821 on 12/19/2016, 2295 on 03/13/2017, 2500 on 06/30/2017, 1890 09/29/2017, 1769 on 01/08/2018, 1244 on 04/12/2018, 1509 on 07/06/2018, and 1567 on 09/11/2018.  Serum viscosity has been followed: 5.5 on 03/18/2016 and 1.6 on 09/11/2018.  Chest, abdomen, and pelvic CT on 04/21/2016 revealed a partial response to therapy.  There were bilateral pulmonary nodules/masses, measuring up to 5.6 cm in the  right lower lobe (improved).  There was trace left pleural effusion.  There was a 12.7 cm left renal/perirenal mass (decreased) with associated mild left hydronephrosis.  Retroperitoneal/left pelvic lymphadenopathy was mildly decreased.  Multifocal subcutaneous nodules, measuring up to 4.5 cm in the left lower anterior abdominal wall were stable to mildly improved.  Abdomen and pelvic CT on 12/28/2017 was concerning for small bowel obstruction with transitional zone at the distal ileum.  There was no associated mass.  LEFT renal mass measured 6.1 x 5.0 cm (previously 13.7 x 10.7 cm).  Suspect combination of mass and adenopathy due to lymphoma in the left adrenal. This area was much less pronounced than on prior study. There was a 1.7 x 1.2 cm lymph node in the RIGHT external iliac node chain.  Subcutaneous deposits bilaterally were considerably smaller compared to 2017.  There were sizable calculi (2.0 x 1.7 cm, 2.6 x 2.0 cm) in each kidney without obstruction.  Chest, abdomen, and pelvic CT on 07/11/2018 revealed a hypodense 3.6 cm inferior LEFT thyroid nodule (stable), new subpleural medial 0.8 cm apical RUL nodule, new subpleural anterior 0.5 cm LUL nodule, basilar LLL mass measuring 4.6 x 3.3 cm (previously 4.2 x 3.2 cm) and a basilar RLL nodule measuring 1.4 cm (previously 0.6 cm). The LEFT upper breast mass measuring 2.4 x 2.0 cm (previously 3.8 x 2.1 cm). There were superficial RIGHT chest wall masses measuring up to 2.3 x 0.9 cm (previously 4.1 x 1.6 cm). There were several bulky stones in the RIGHT renal pelvis measuring up to 22 x 11 mm, and a non-obstructing 13 mm LEFT renal stone. There was a coarsely calcified 2.3 cm anterior interpolar RIGHT renal cortical lesion, and a simple 1.8 cm anterior RIGHT renal cyst. The infiltrative soft tissue density, replacing the LEFT renal sinus and partially encasing the kidney/vascular pedicle, spanned up to 10.2 x 6.5 cm.   Symptomatically, she denies any B  symptoms.  She feels "fine".  Exam is stable.   Plan: 1. Labs today:  CBC with diff, CMP, IgM, uric acid, serum viscosity, hepatitis B and C serologies. 2.  Waldenstrm's macroglobulinemia:  Discuss prior converation regarding need for change in treatment given mixed response.  Patient states that she feels fine.  Denies need to change therapy.  Review images from chest, abdomen, and pelvic CT scan with patient.  Discuss patients' prior conversation regarding no IV treatment.  Discuss consideration bendamustine and Rituxan (BR) at reduced dose secondary to age. Potential side effects reviewed. Patient declines.  Review regimen involving Rituxan, Velcade and Decadron.  Discuss SQ Velcade weekly.  Potential side effects of tretment reviewed.  Discuss obtaining hepatitis serologies (hepatitis B surface antigen, hepatitis B core antibody) in anticipation of Rituxan.  Discuss obtaining second opinion with Dr. Lonia Blood at [facility].  Patient in agreement.  She will need to work out transportation issues.  3. LEFT renal mass Etiology felt secondary to lymphoplasmacytic lymphoma/Waldenstrom's secondary to prior response to therapy. 4. Left hip pain:  No trauma.  Discuss imaging (plain films).  Patient would like to defer evaluation. 5.  Hypertension:  Discuss elevated blood pressure on clinic visits.  Unclear if related to renal mass, essential hypertension or white coat hypertension.   Repeat BP today. 6.  Consult Dr. Lonia Blood at [facility] re: second opinion regarding treatment options.  Patient to call with available times for she has for transportation (coordinate with [facility]). 7.  RTC in 1 month for MD assessment, labs (CBC with diff, CMP, uric acid).  A total of (> 20) minutes of face-to-face time was spent with the patient with greater than 50% of that time in counseling and care-coordination.    Lequita Asal, MD  09/11/18, 8:55 AM

## 2018-09-12 ENCOUNTER — Telehealth: Payer: Self-pay | Admitting: *Deleted

## 2018-09-12 LAB — IGM: IgM (Immunoglobulin M), Srm: 1567 mg/dL — ABNORMAL HIGH (ref 26–217)

## 2018-09-12 LAB — HEPATITIS B CORE ANTIBODY, TOTAL: Hep B Core Total Ab: NEGATIVE

## 2018-09-12 LAB — HEPATITIS B SURFACE ANTIGEN: Hepatitis B Surface Ag: NEGATIVE

## 2018-09-12 LAB — HEPATITIS C ANTIBODY: HCV Ab: <0.1 s/co ratio (ref 0.0–0.9)

## 2018-09-12 NOTE — Telephone Encounter (Signed)
Called patient to inform her that looking at her lab results yesterday her uric acid is elevated.  She apparently has not been taking her Allopurinol as stated at yesterday's visit.  She should have run out of her prescription in October.  A new prescription has been called to her pharmacy.  Patient advised to pick up prescription and begin taking.  Patient verbalized understanding and states she will pick it up and get started back on it.

## 2018-09-12 NOTE — Telephone Encounter (Signed)
-----   Message from Karen Kitchens, NP sent at 09/11/2018  4:35 PM EST ----- She needs to make sure she goes and picks it up. She would have ran out in October based on the Rx.   Gaspar Bidding  ----- Message ----- From: Shirlean Kelly, RN Sent: 09/11/2018   3:22 PM EST To: Karen Kitchens, NP  I went over her medications with her this morning.  She assured me she was taking everything listed.  Is there something you want me to relay to her? ----- Message ----- From: Karen Kitchens, NP Sent: 09/11/2018  12:45 PM EST To: Shirlean Kelly, RN  No way she is taking her allopurinol. Level is up. Was only given 3 months in 04/2018.   BG

## 2018-09-13 LAB — VISCOSITY, SERUM: Viscosity, Serum: 1.6 rel.saline (ref 1.6–1.9)

## 2018-09-15 ENCOUNTER — Emergency Department: Payer: Medicare HMO

## 2018-09-15 ENCOUNTER — Emergency Department
Admission: EM | Admit: 2018-09-15 | Discharge: 2018-09-15 | Disposition: A | Discharge status: Home / Self Care | Payer: Medicare HMO | Attending: Emergency Medicine | Admitting: Emergency Medicine

## 2018-09-15 ENCOUNTER — Encounter: Payer: Self-pay | Admitting: Emergency Medicine

## 2018-09-15 ENCOUNTER — Other Ambulatory Visit: Payer: Self-pay

## 2018-09-15 DIAGNOSIS — Z79899 Other long term (current) drug therapy: Secondary | ICD-10-CM | POA: Diagnosis not present

## 2018-09-15 DIAGNOSIS — M1612 Unilateral primary osteoarthritis, left hip: Secondary | ICD-10-CM

## 2018-09-15 DIAGNOSIS — M25552 Pain in left hip: Secondary | ICD-10-CM

## 2018-09-15 DIAGNOSIS — I10 Essential (primary) hypertension: Secondary | ICD-10-CM | POA: Insufficient documentation

## 2018-09-15 MED ORDER — DEXAMETHASONE SODIUM PHOSPHATE 10 MG/ML IJ SOLN
10.0000 mg | Freq: Once | INTRAMUSCULAR | Status: AC
  Administered 2018-09-15: 10 mg via INTRAMUSCULAR
  Filled 2018-09-15: qty 1

## 2018-09-15 MED ORDER — PREDNISONE 10 MG PO TABS: ORAL_TABLET | ORAL | 0 refills | Status: DC

## 2018-09-15 MED ORDER — DICLOFENAC SODIUM 1 % TD GEL: 4.0000 g | Freq: Two times a day (BID) | TRANSDERMAL | 0 refills | Status: DC | PRN

## 2018-09-15 NOTE — ED Triage Notes (Signed)
L hip pain x 4 days. Denies fall or injury. Points to L buttock as site of pain.

## 2018-09-15 NOTE — ED Provider Notes (Signed)
[facility] Emergency Department Provider Note   ____________________________________________   First MD Initiated Contact with Patient 09/15/18 1011     (approximate)  I have reviewed the triage vital signs and the nursing notes.   HISTORY  Chief Complaint Hip Pain   HPI Kristy Solomon is a 84 y.o. female Kristy Solomon to the ED with complaint of left hip pain for 4 days.  She denies any fall or injury.  She states that she got up from the sofa 4 days ago and felt a "catch".  Since that time she has continued to have pain and increased pain with ambulation.  She denies any prior injury to her hip.  She states that over-the-counter medication has not helped with her pain.  She rates her pain as an 8 out of 10.   Past Medical History:  Diagnosis Date  . 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 Hyperlipidemia   . Hypertension   . 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 Renal insufficiency     Patient Active Problem List   Diagnosis Date Noted  . Nephrolithiasis 01/11/2018  . Goals of care, counseling/discussion 12/28/2017  . Gastroenteritis 12/28/2017  . B12 deficiency 03/18/2016  . Folate deficiency 03/18/2016  . Iron deficiency anemia 03/18/2016  . Pneumonia 03/17/2016  . Pressure ulcer 03/17/2016  . Thrombocytopenia ([place]) 07/04/2014  . Acute renal failure ([place]) 07/04/2014  . Hyperuricemia 07/04/2014  . 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 Calculi, ureter 07/03/2012  . Frank hematuria 07/03/2012  . Hydronephrosis 07/03/2012  . 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 Calculus of kidney 07/02/2012  . Nonspecific finding on examination of urine 07/02/2012    Past Surgical History:  Procedure Laterality Date  . ABDOMINAL  HYSTERECTOMY    . HEMORRHOID SURGERY      Prior to Admission medications   Medication Sig Start Date End Date Taking? Authorizing Provider  allopurinol (ZYLOPRIM) 100 MG tablet Take 2 tablets (200 mg total) by mouth daily. 09/11/18   Karen Kitchens, NP  amLODipine-benazepril (LOTREL) 5-20 MG capsule Take 1 capsule by mouth daily.     [provider]  atorvastatin (LIPITOR) 20 MG tablet Take 20 mg by mouth daily. 12/24/13   [provider]  Calcium Citrate-Vitamin D 315-250 MG-UNIT TABS Take by mouth. 06/27/12   [provider]  colchicine 0.6 MG tablet Take 0.6 mg by mouth as needed. For gout    [provider]  diclofenac sodium (VOLTAREN) 1 % GEL Apply 4 g topically 2 (two) times daily as needed. 09/15/18   Johnn Hai, PA-C  folic acid (FOLVITE) 1 MG tablet TAKE 1 TABLET BY MOUTH ONCE DAILY 07/03/18   Lequita Asal, MD  furosemide (LASIX) 20 MG tablet Take 20 mg by mouth daily.    [provider]  gabapentin (NEURONTIN) 300 MG capsule Take 300 mg by mouth 3 (three) times daily.     [provider]  hydrALAZINE (APRESOLINE) 25 MG tablet Take 25 mg by mouth 3 (three) times daily.     [provider]  IMBRUVICA 420 MG TABS TAKE 1 TABLET (420 MG) BY MOUTH DAILY. 07/04/18   Karen Kitchens, NP  loperamide (IMODIUM A-D) 2 MG tablet Take 1 tablet (2 mg total) by mouth 4 (four) times daily as needed for  diarrhea or loose stools. Patient not taking: Reported on 07/16/2018 12/29/17   Dustin Flock, MD  metoprolol tartrate (LOPRESSOR) 25 MG tablet Take 25 mg by mouth 2 (two) times daily. 03/05/16   [provider]  omeprazole (PRILOSEC) 20 MG capsule Take 20 mg by mouth daily. 03/02/16   [provider]  predniSONE (DELTASONE) 10 MG tablet Take 1 tablet once a day starting Sunday for 3 days 09/15/18   Johnn Hai, PA-C    Allergies Patient has no known allergies.  Family History  Problem Relation Age of  Onset  . Cancer Brother        throat ca  . Cancer Brother        bone cancer    Social History Social History   Tobacco Use  . Smoking status: Never Smoker  . Smokeless tobacco: Never Used  Substance Use Topics  . Alcohol use: No  . Drug use: No    Review of Systems Constitutional: No fever/chills Eyes: No visual changes. Cardiovascular: Denies chest pain. Respiratory: Denies shortness of breath. Gastrointestinal: No abdominal pain.  No nausea, no vomiting.  No diarrhea.  No constipation. Genitourinary: Negative for dysuria. Musculoskeletal: Positive for left hip pain. Skin: Negative for rash. Neurological: Negative for headaches, focal weakness or numbness. ____________________________________________   PHYSICAL EXAM:  VITAL SIGNS: ED Triage Vitals  Enc Vitals Group     BP 09/15/18 0942 109/75     Pulse Rate 09/15/18 0942 (!) 56     Resp 09/15/18 0942 18     Temp 09/15/18 0942 98.1 F (36.7 C)     Temp Source 09/15/18 0942 Oral     SpO2 09/15/18 0942 100 %     Weight 09/15/18 0945 203 lb (92.1 kg)     Height 09/15/18 0945 5\' 3"  (1.6 m)     Head Circumference --      Peak Flow --      Pain Score 09/15/18 0944 8     Pain Loc --      Pain Edu? --      Excl. in [place]? --    Constitutional: Alert and oriented. Well appearing and in no acute distress.  Patient is lying on the stretcher and does not appear to be in acute distress. Eyes: Conjunctivae are normal.  Head: Atraumatic. Nose: No congestion/rhinnorhea. Neck: No stridor.   Cardiovascular: Normal rate, regular rhythm. Grossly normal heart sounds.  Good peripheral circulation. Respiratory: Normal respiratory effort.  No retractions. Lungs CTAB. Gastrointestinal: Soft and nontender. No distention.  Bowel sounds normoactive 4 quadrants.  No CVA tenderness. Musculoskeletal: On examination of the left hip there is no gross deformity and no soft tissue evidence of injury.  There is no edema or abrasions seen.   Range of motion is restricted secondary to patient's pain.  There was discomfort with abduction, abduction and extension.  No crepitus was appreciated.  Motor sensory function intact.  Capillary refills less than 3 seconds and pulses present.  Skin is intact without discoloration. Neurologic:  Normal speech and language. No gross focal neurologic deficits are appreciated.  Gait was not tested secondary to patient's pain. Skin:  Skin is warm, dry and intact.  Psychiatric: Mood and affect are normal. Speech and behavior are normal.  ____________________________________________   LABS (all labs ordered are listed, but only abnormal results are displayed)  Labs Reviewed - No data to display   RADIOLOGY  ED MD interpretation:   Left hip x-ray is negative for acute  fracture.  Vascular calcifications were noted.  Official radiology report(s): Dg Hip Unilat W Or Wo Pelvis 2-3 Views Left  Result Date: 09/15/2018 CLINICAL DATA:  84 year old female with left hip pain for the past 4 days. No fall or recent injury. EXAM: DG HIP (WITH OR WITHOUT PELVIS) 2-3V LEFT COMPARISON:  CT scan of the abdomen and pelvis 07/11/2018 FINDINGS: There is no evidence of hip fracture or dislocation. There is no evidence of arthropathy or other focal bone abnormality. Atherosclerotic vascular calcifications noted incidentally. IMPRESSION: No acute fracture or malalignment. Scattered atherosclerotic vascular calcifications. Electronically Signed   By: Jacqulynn Cadet M.D.   On: 09/15/2018 11:14  ____________________________________________   PROCEDURES  Procedure(s) performed: None  Procedures  Critical Care performed: No  ____________________________________________   INITIAL IMPRESSION / ASSESSMENT AND PLAN / ED COURSE  As part of my medical decision making, I reviewed the following data within the electronic MEDICAL RECORD NUMBER Notes from prior ED visits and [place] Controlled Substance  Database  ----------------------------------------- 11:07 AM on 09/15/2018 ----------------------------------------- Patient was given Decadron 10 mg IM while in the department.  X-rays were reassuring that there was no acute fracture.  Patient constantly complained that she wanted medication to prevent this from ever happening to her again and was disgruntled and knowing that she had some degenerative changes but not an acute fracture.  Patient strongly requested that she be given a pill.  I explained to her that pain medication and muscle relaxants were contraindicated at her age and the fact that she lives alone.  We also discussed the fact that this would increase her chances for falling and increased injury.  Patient was adamant that she received a pill.  After talking with the family it was agreed upon that she would take prednisone 10 mg for 3 days starting on Sunday.  Is also given a prescription for Voltaren gel to apply to her hip twice a day.  Patient is to follow-up with her PCP if any continued problems.  Patient was discharged to return home with daughter.   ____________________________________________   FINAL CLINICAL IMPRESSION(S) / ED DIAGNOSES  Final diagnoses:  Left hip pain  Osteoarthritis of left hip, unspecified osteoarthritis type     ED Discharge Orders         Ordered    diclofenac sodium (VOLTAREN) 1 % GEL  2 times daily PRN     09/15/18 1153    predniSONE (DELTASONE) 10 MG tablet     12 /14/19 1201           Note:  This document was prepared using Dragon voice recognition software and may include unintentional dictation errors.    Johnn Hai, PA-C 09/15/18 [place], [place], MD 09/20/18 [phone]

## 2018-09-15 NOTE — ED Notes (Signed)
Left hip pain - pt able to bear weight. Denies injury. +2 pedal pulse

## 2018-09-15 NOTE — Discharge Instructions (Addendum)
Follow-up with your primary care provider, Dr. Elijio Miles if any continued problems.  X-rays today do not show any fracture or injury to the bone.  You were given a injection of Decadron which should help with pain as well as inflammation.  Follow-up with your primary care provider for any continued pain medication.  You may use ice or heat to your hip as needed for discomfort.  Also Voltaren gel was sent to your pharmacy that you may apply to your hip as needed for discomfort.  Also a prescription for prednisone was sent to your pharmacy.  Begin taking this medication on Sunday.  Is 1 pill once a day for the next 3 days.

## 2018-09-17 ENCOUNTER — Observation Stay
Admission: EM | Admit: 2018-09-17 | Discharge: 2018-09-19 | Disposition: A | Discharge status: Home Health | Payer: Medicare HMO | Attending: Internal Medicine | Admitting: Internal Medicine

## 2018-09-17 ENCOUNTER — Other Ambulatory Visit: Payer: Self-pay

## 2018-09-17 ENCOUNTER — Encounter: Payer: Self-pay | Admitting: Emergency Medicine

## 2018-09-17 ENCOUNTER — Emergency Department: Payer: Medicare HMO

## 2018-09-17 DIAGNOSIS — E876 Hypokalemia: Secondary | ICD-10-CM | POA: Insufficient documentation

## 2018-09-17 DIAGNOSIS — I313 Pericardial effusion (noninflammatory): Secondary | ICD-10-CM | POA: Diagnosis not present

## 2018-09-17 DIAGNOSIS — M549 Dorsalgia, unspecified: Secondary | ICD-10-CM | POA: Diagnosis present

## 2018-09-17 DIAGNOSIS — Z23 Encounter for immunization: Secondary | ICD-10-CM | POA: Insufficient documentation

## 2018-09-17 DIAGNOSIS — R109 Unspecified abdominal pain: Secondary | ICD-10-CM | POA: Insufficient documentation

## 2018-09-17 DIAGNOSIS — C8800 Waldenstrom macroglobulinemia not having achieved remission: Secondary | ICD-10-CM | POA: Diagnosis present

## 2018-09-17 DIAGNOSIS — I071 Rheumatic tricuspid insufficiency: Secondary | ICD-10-CM | POA: Insufficient documentation

## 2018-09-17 DIAGNOSIS — I499 Cardiac arrhythmia, unspecified: Secondary | ICD-10-CM

## 2018-09-17 DIAGNOSIS — I1310 Hypertensive heart and chronic kidney disease without heart failure, with stage 1 through stage 4 chronic kidney disease, or unspecified chronic kidney disease: Secondary | ICD-10-CM | POA: Diagnosis not present

## 2018-09-17 DIAGNOSIS — Z79899 Other long term (current) drug therapy: Secondary | ICD-10-CM | POA: Insufficient documentation

## 2018-09-17 DIAGNOSIS — R008 Other abnormalities of heart beat: Secondary | ICD-10-CM | POA: Diagnosis not present

## 2018-09-17 DIAGNOSIS — E785 Hyperlipidemia, unspecified: Secondary | ICD-10-CM | POA: Diagnosis present

## 2018-09-17 DIAGNOSIS — I10 Essential (primary) hypertension: Secondary | ICD-10-CM | POA: Diagnosis present

## 2018-09-17 DIAGNOSIS — M25552 Pain in left hip: Secondary | ICD-10-CM | POA: Diagnosis present

## 2018-09-17 DIAGNOSIS — N179 Acute kidney failure, unspecified: Secondary | ICD-10-CM | POA: Insufficient documentation

## 2018-09-17 DIAGNOSIS — D631 Anemia in chronic kidney disease: Secondary | ICD-10-CM | POA: Insufficient documentation

## 2018-09-17 DIAGNOSIS — K219 Gastro-esophageal reflux disease without esophagitis: Secondary | ICD-10-CM | POA: Diagnosis present

## 2018-09-17 DIAGNOSIS — I493 Ventricular premature depolarization: Secondary | ICD-10-CM | POA: Diagnosis not present

## 2018-09-17 DIAGNOSIS — C8300 Small cell B-cell lymphoma, unspecified site: Secondary | ICD-10-CM | POA: Diagnosis not present

## 2018-09-17 DIAGNOSIS — N183 Chronic kidney disease, stage 3 (moderate): Secondary | ICD-10-CM | POA: Insufficient documentation

## 2018-09-17 DIAGNOSIS — R001 Bradycardia, unspecified: Secondary | ICD-10-CM | POA: Diagnosis present

## 2018-09-17 DIAGNOSIS — I491 Atrial premature depolarization: Secondary | ICD-10-CM | POA: Insufficient documentation

## 2018-09-17 DIAGNOSIS — I498 Other specified cardiac arrhythmias: Secondary | ICD-10-CM | POA: Diagnosis present

## 2018-09-17 LAB — BASIC METABOLIC PANEL
Anion gap: 7 (ref 5–15)
BUN: 33 mg/dL — ABNORMAL HIGH (ref 8–23)
CO2: 22 mmol/L (ref 22–32)
Calcium: 8.2 mg/dL — ABNORMAL LOW (ref 8.9–10.3)
Chloride: 108 mmol/L (ref 98–111)
Creatinine, Ser: 1.49 mg/dL — ABNORMAL HIGH (ref 0.44–1.00)
GFR calc Af Amer: 36 mL/min — ABNORMAL LOW (ref >60)
GFR calc non Af Amer: 31 mL/min — ABNORMAL LOW (ref >60)
GLUCOSE: 98 mg/dL (ref 70–99)
Potassium: 3.6 mmol/L (ref 3.5–5.1)
Sodium: 137 mmol/L (ref 135–145)

## 2018-09-17 NOTE — ED Triage Notes (Signed)
PT to ER via EMS from home.  Per EMS pt walked to their stretcher.  Pt was seen here on Saturday for hip pain, evaluated and discharged.  Pt states she has been taking her pain medication as prescribed, but continues to have escalation in her pain.

## 2018-09-17 NOTE — ED Provider Notes (Signed)
[facility] Emergency Department Provider Note   ____________________________________________   First MD Initiated Contact with Patient 09/17/18 2156     (approximate)  I have reviewed the triage vital signs and the nursing notes.   HISTORY  Chief Complaint Hip Pain    HPI Kristy Solomon is a 84 y.o. female patient seen earlier for hip pain went home with some prednisone but that has not helped the pain is getting worse.  Patient reports pain is actually behind her hip.  Is worse with palpation or movement.  X-ray and CT last time were negative.  X-ray this time also negative.  Incidentally patient is bradycardic.  She reports she feels fine.  No dizziness or lightheadedness or weakness or any other complaints.  Her blood pressure initially was high but it is come down to 751 systolic.  Heart rate comes up as soon as she moves or starts to talk.  Just stays low if she is laying still.   Past Medical History:  Diagnosis Date  . 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 Hyperlipidemia   . Hypertension   . 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 Renal insufficiency     Patient Active Problem List   Diagnosis Date Noted  . Nephrolithiasis 01/11/2018  . Goals of care, counseling/discussion 12/28/2017  . Gastroenteritis 12/28/2017  . B12 deficiency 03/18/2016  . Folate deficiency 03/18/2016  . Iron deficiency anemia 03/18/2016  . Pneumonia 03/17/2016  . Pressure ulcer 03/17/2016  . Thrombocytopenia ([place]) 07/04/2014  . Acute renal failure ([place]) 07/04/2014  . Hyperuricemia 07/04/2014  . 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 Calculi, ureter 07/03/2012  . Frank hematuria 07/03/2012  . Hydronephrosis 07/03/2012  . 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 Neoplasm of uncertain behavior of other lymphatic and hematopoietic  tissues(238.79) 07/03/2012  . Calculus of kidney 07/02/2012  . Nonspecific finding on examination of urine 07/02/2012    Past Surgical History:  Procedure Laterality Date  . ABDOMINAL HYSTERECTOMY    . HEMORRHOID SURGERY      Prior to Admission medications   Medication Sig Start Date End Date Taking? Authorizing Provider  allopurinol (ZYLOPRIM) 100 MG tablet Take 2 tablets (200 mg total) by mouth daily. 09/11/18   Karen Kitchens, NP  amLODipine-benazepril (LOTREL) 5-20 MG capsule Take 1 capsule by mouth daily.     [provider]  atorvastatin (LIPITOR) 20 MG tablet Take 20 mg by mouth daily. 12/24/13   [provider]  Calcium Citrate-Vitamin D 315-250 MG-UNIT TABS Take by mouth. 06/27/12   [provider]  colchicine 0.6 MG tablet Take 0.6 mg by mouth as needed. For gout    [provider]  diclofenac sodium (VOLTAREN) 1 % GEL Apply 4 g topically 2 (two) times daily as needed. 09/15/18   Johnn Hai, PA-C  folic acid (FOLVITE) 1 MG tablet TAKE 1 TABLET BY MOUTH ONCE DAILY 07/03/18   Lequita Asal, MD  furosemide (LASIX) 20 MG tablet Take 20 mg by mouth daily.    [provider]  gabapentin (NEURONTIN) 300 MG capsule Take 300 mg by mouth 3 (three) times daily.     [provider]  hydrALAZINE (APRESOLINE) 25 MG tablet Take 25 mg by mouth 3 (three) times daily.     [provider]  IMBRUVICA 420 MG TABS TAKE 1 TABLET (420 MG) BY MOUTH DAILY. 07/04/18  Karen Kitchens, NP  loperamide (IMODIUM A-D) 2 MG tablet Take 1 tablet (2 mg total) by mouth 4 (four) times daily as needed for diarrhea or loose stools. Patient not taking: Reported on 07/16/2018 12/29/17   Dustin Flock, MD  metoprolol tartrate (LOPRESSOR) 25 MG tablet Take 25 mg by mouth 2 (two) times daily. 03/05/16   [provider]  omeprazole (PRILOSEC) 20 MG capsule Take 20 mg by mouth daily. 03/02/16   [provider]  predniSONE (DELTASONE) 10 MG  tablet Take 1 tablet once a day starting Sunday for 3 days 09/15/18   Johnn Hai, PA-C    Allergies Patient has no known allergies.  Family History  Problem Relation Age of Onset  . Cancer Brother        throat ca  . Cancer Brother        bone cancer    Social History Social History   Tobacco Use  . Smoking status: Never Smoker  . Smokeless tobacco: Never Used  Substance Use Topics  . Alcohol use: No  . Drug use: No    Review of Systems  Constitutional: No fever/chills Eyes: No visual changes. ENT: No sore throat. Cardiovascular: Denies chest pain. Respiratory: Denies shortness of breath. Gastrointestinal: No abdominal pain.  No nausea, no vomiting.  No diarrhea.  No constipation. Genitourinary: Negative for dysuria. Musculoskeletal: Negative for back pain. Skin: Negative for rash. Neurological: Negative for headaches, focal weakness or numbness.   ____________________________________________   PHYSICAL EXAM:  VITAL SIGNS: ED Triage Vitals  Enc Vitals Group     BP 09/17/18 2204 (!) 207/89     Pulse Rate 09/17/18 2204 (!) 51     Resp 09/17/18 2204 18     Temp 09/17/18 2204 98 F (36.7 C)     Temp src --      SpO2 09/17/18 2159 98 %     Weight 09/17/18 2201 203 lb (92.1 kg)     Height 09/17/18 2201 5\' 3"  (1.6 m)     Head Circumference --      Peak Flow --      Pain Score 09/17/18 2201 9     Pain Loc --      Pain Edu? --      Excl. in [place]? --     Constitutional: Alert and oriented. Well appearing and in no acute distress. Eyes: Conjunctivae are normal.  Head: Atraumatic. Nose: No congestion/rhinnorhea. Mouth/Throat: Mucous membranes are moist.  Oropharynx non-erythematous. Neck: No stridor.   Cardiovascular: Normal rate, regular rhythm. Grossly normal heart sounds.  Good peripheral circulation. Respiratory: Normal respiratory effort.  No retractions. Lungs CTAB. Gastrointestinal: Soft and nontender. No distention. No abdominal bruits. No CVA  tenderness.  There is tenderness on palpation posteriorly over the left hip.  Musculoskeletal: No lower extremity tenderness nor edema.  Neurologic:  Normal speech and language. No gross focal neurologic deficits are appreciated.  Straight leg raise is negative Skin:  Skin is warm, dry and intact. No rash noted. Psychiatric: Mood and affect are normal. Speech and behavior are normal.  ____________________________________________   LABS (all labs ordered are listed, but only abnormal results are displayed)  Labs Reviewed  BASIC METABOLIC PANEL - Abnormal; Notable for the following components:      Result Value   BUN 33 (*)    Creatinine, Ser 1.49 (*)    Calcium 8.2 (*)    GFR calc non Af Amer 31 (*)    GFR calc Af Wyvonnia Lora  36 (*)    All other components within normal limits  CBC WITH DIFFERENTIAL/PLATELET - Abnormal; Notable for the following components:   MCH 25.6 (*)    RDW 16.1 (*)    Platelets 87 (*)    All other components within normal limits   ____________________________________________  EKG   ____________________________________________  RADIOLOGY  ED MD interpretation: X-ray negative for any acute problems.  Radiology read the film and I reviewed it  Official radiology report(s): Dg Pelvis 1-2 Views  Result Date: 09/17/2018 CLINICAL DATA:  Left hip pain EXAM: PELVIS - 1-2 VIEW COMPARISON:  12/29/2017 radiographs, CT from 12/28/2017 FINDINGS: There is no evidence of pelvic fracture or diastasis. Facet arthropathy at L5-S1 with joint space narrowing sclerosis. No pelvic bone lesions are seen. IMPRESSION: 1. No radiographic findings for the patient's left hip pain. 2. No acute fracture nor joint dislocation. 3. Lower lumbar facet arthropathy. Electronically Signed   By: Ashley Royalty M.D.   On: 09/17/2018 23:02    ____________________________________________   PROCEDURES  Procedure(s) performed:   Procedures  Critical Care performed:    ____________________________________________   INITIAL IMPRESSION / ASSESSMENT AND PLAN / ED COURSE  Patient reports pain continues to worsen.  Her GFR is too bad for me to try nonsteroidals especially at her age and with her blood pressure.  Patient is aware of the risk of narcotics.  I discussed that with her in detail.  She still wants something for pain.  I will try some low-dose Vicodin half a pill 4 times a day which she can increase to 1 pill 4 times a day if needed.  We will have her follow-up with orthopedics possibly they can inject her painful area.   Patient is not raising her heart rate anymore still staying in the 40s all the time I will get her in the hospital   ____________________________________________   FINAL CLINICAL IMPRESSION(S) / ED DIAGNOSES  Final diagnoses:  Left hip pain  Bradycardia     ED Discharge Orders    None       Note:  This document was prepared using Dragon voice recognition software and may include unintentional dictation errors.    Nena Polio, MD 09/18/18 [phone]

## 2018-09-17 NOTE — Discharge Instructions (Addendum)
Please return for worse pain.  Please follow-up with your regular doctor to make sure your heart rate is doing well.  Try to see him tomorrow please follow-up with Dr. Posey Pronto orthopedics.  He may be able to help more than we can with the hip pain.  Try the Vicodin if needed.  Be careful it can make you woozy.  It can also make you constipated.  Do not fall if you are taking it.  Try half a pill 4 times a day if that does not work you can go up to 1 pill 4 times a day.  I probably would not take more Vicodin than that.  Until you see your regular doctor please cut the metoprolol dose in half to 1 take 1/2 pill twice a day instead of 1 whole pill.  That should make your heart rate pick up a little.  HHPT Fall precaution.

## 2018-09-18 ENCOUNTER — Encounter: Payer: Self-pay | Admitting: Hematology and Oncology

## 2018-09-18 ENCOUNTER — Observation Stay (HOSPITAL_BASED_OUTPATIENT_CLINIC_OR_DEPARTMENT_OTHER)
Admit: 2018-09-18 | Discharge: 2018-09-18 | Disposition: A | Discharge status: Home / Self Care | Payer: Medicare HMO | Attending: Internal Medicine | Admitting: Internal Medicine

## 2018-09-18 ENCOUNTER — Other Ambulatory Visit: Payer: Self-pay

## 2018-09-18 DIAGNOSIS — I10 Essential (primary) hypertension: Secondary | ICD-10-CM | POA: Diagnosis present

## 2018-09-18 DIAGNOSIS — M545 Low back pain: Secondary | ICD-10-CM | POA: Diagnosis not present

## 2018-09-18 DIAGNOSIS — R9431 Abnormal electrocardiogram [ECG] [EKG]: Secondary | ICD-10-CM | POA: Diagnosis not present

## 2018-09-18 DIAGNOSIS — R001 Bradycardia, unspecified: Secondary | ICD-10-CM | POA: Diagnosis present

## 2018-09-18 DIAGNOSIS — K219 Gastro-esophageal reflux disease without esophagitis: Secondary | ICD-10-CM | POA: Diagnosis present

## 2018-09-18 DIAGNOSIS — I493 Ventricular premature depolarization: Secondary | ICD-10-CM | POA: Diagnosis not present

## 2018-09-18 DIAGNOSIS — E785 Hyperlipidemia, unspecified: Secondary | ICD-10-CM | POA: Diagnosis present

## 2018-09-18 DIAGNOSIS — I499 Cardiac arrhythmia, unspecified: Secondary | ICD-10-CM

## 2018-09-18 DIAGNOSIS — M549 Dorsalgia, unspecified: Secondary | ICD-10-CM | POA: Diagnosis present

## 2018-09-18 DIAGNOSIS — I498 Other specified cardiac arrhythmias: Secondary | ICD-10-CM | POA: Diagnosis present

## 2018-09-18 LAB — CBC WITH DIFFERENTIAL/PLATELET
Abs Immature Granulocytes: 0.05 10*3/uL (ref 0.00–0.07)
Basophils Absolute: 0 10*3/uL (ref 0.0–0.1)
Basophils Relative: 1 %
Eosinophils Absolute: 0 10*3/uL (ref 0.0–0.5)
Eosinophils Relative: 0 %
HCT: 42.5 % (ref 36.0–46.0)
Hemoglobin: 13.1 g/dL (ref 12.0–15.0)
Immature Granulocytes: 1 %
LYMPHS ABS: 1.6 10*3/uL (ref 0.7–4.0)
Lymphocytes Relative: 28 %
MCH: 25.6 pg — ABNORMAL LOW (ref 26.0–34.0)
MCHC: 30.8 g/dL (ref 30.0–36.0)
MCV: 83.2 fL (ref 80.0–100.0)
MONOS PCT: 10 %
Monocytes Absolute: 0.5 10*3/uL (ref 0.1–1.0)
Neutro Abs: 3.4 10*3/uL (ref 1.7–7.7)
Neutrophils Relative %: 60 %
PLATELETS: 87 10*3/uL — AB (ref 150–400)
RBC: 5.11 MIL/uL (ref 3.87–5.11)
RDW: 16.1 % — ABNORMAL HIGH (ref 11.5–15.5)
Smear Review: UNDETERMINED
WBC: 5.6 10*3/uL (ref 4.0–10.5)
nRBC: 0 % (ref 0.0–0.2)

## 2018-09-18 LAB — CBC
HCT: 46.5 % — ABNORMAL HIGH (ref 36.0–46.0)
Hemoglobin: 14.2 g/dL (ref 12.0–15.0)
MCH: 25.2 pg — ABNORMAL LOW (ref 26.0–34.0)
MCHC: 30.5 g/dL (ref 30.0–36.0)
MCV: 82.4 fL (ref 80.0–100.0)
NRBC: 0 % (ref 0.0–0.2)
PLATELETS: 80 10*3/uL — AB (ref 150–400)
RBC: 5.64 MIL/uL — ABNORMAL HIGH (ref 3.87–5.11)
RDW: 16.1 % — ABNORMAL HIGH (ref 11.5–15.5)
WBC: 4.3 10*3/uL (ref 4.0–10.5)

## 2018-09-18 LAB — ECHOCARDIOGRAM COMPLETE
Height: 63 in
WEIGHTICAEL: 3220.8 [oz_av]

## 2018-09-18 LAB — BASIC METABOLIC PANEL
Anion gap: 8 (ref 5–15)
BUN: 28 mg/dL — ABNORMAL HIGH (ref 8–23)
CO2: 23 mmol/L (ref 22–32)
Calcium: 8.4 mg/dL — ABNORMAL LOW (ref 8.9–10.3)
Chloride: 108 mmol/L (ref 98–111)
Creatinine, Ser: 1.3 mg/dL — ABNORMAL HIGH (ref 0.44–1.00)
GFR calc Af Amer: 43 mL/min — ABNORMAL LOW (ref >60)
GFR, EST NON AFRICAN AMERICAN: 37 mL/min — AB (ref >60)
Glucose, Bld: 92 mg/dL (ref 70–99)
Potassium: 3.3 mmol/L — ABNORMAL LOW (ref 3.5–5.1)
Sodium: 139 mmol/L (ref 135–145)

## 2018-09-18 LAB — TROPONIN I: Troponin I: 0.03 ng/mL (ref <0.03)

## 2018-09-18 LAB — TSH: TSH: 2.132 u[IU]/mL (ref 0.350–4.500)

## 2018-09-18 LAB — MAGNESIUM: Magnesium: 1.9 mg/dL (ref 1.7–2.4)

## 2018-09-18 MED ORDER — PANTOPRAZOLE SODIUM 40 MG PO TBEC
40.0000 mg | DELAYED_RELEASE_TABLET | Freq: Every day | ORAL | Status: DC
  Administered 2018-09-18 – 2018-09-19 (×2): 40 mg via ORAL
  Filled 2018-09-18 (×2): qty 1

## 2018-09-18 MED ORDER — PNEUMOCOCCAL VAC POLYVALENT 25 MCG/0.5ML IJ INJ
0.5000 mL | INJECTION | INTRAMUSCULAR | Status: AC
  Administered 2018-09-19: 0.5 mL via INTRAMUSCULAR
  Filled 2018-09-18: qty 0.5

## 2018-09-18 MED ORDER — ACETAMINOPHEN 650 MG RE SUPP: 650.0000 mg | Freq: Four times a day (QID) | RECTAL | Status: DC | PRN

## 2018-09-18 MED ORDER — GABAPENTIN 300 MG PO CAPS
300.0000 mg | ORAL_CAPSULE | Freq: Three times a day (TID) | ORAL | Status: DC
  Administered 2018-09-18 – 2018-09-19 (×5): 300 mg via ORAL
  Filled 2018-09-18 (×5): qty 1

## 2018-09-18 MED ORDER — PREDNISONE 10 MG PO TABS: 10.0000 mg | ORAL_TABLET | Freq: Every day | ORAL | Status: DC

## 2018-09-18 MED ORDER — PREDNISONE 20 MG PO TABS: 20.0000 mg | ORAL_TABLET | Freq: Every day | ORAL | Status: DC

## 2018-09-18 MED ORDER — PREDNISONE 20 MG PO TABS: 40.0000 mg | ORAL_TABLET | Freq: Every day | ORAL | Status: DC

## 2018-09-18 MED ORDER — ACETAMINOPHEN 325 MG PO TABS
650.0000 mg | ORAL_TABLET | Freq: Four times a day (QID) | ORAL | Status: DC | PRN
  Administered 2018-09-18 – 2018-09-19 (×3): 650 mg via ORAL
  Filled 2018-09-18 (×3): qty 2

## 2018-09-18 MED ORDER — BENAZEPRIL HCL 20 MG PO TABS
20.0000 mg | ORAL_TABLET | Freq: Every day | ORAL | Status: DC
  Administered 2018-09-18: 20 mg via ORAL
  Filled 2018-09-18 (×2): qty 1

## 2018-09-18 MED ORDER — INFLUENZA VAC SPLIT HIGH-DOSE 0.5 ML IM SUSY
0.5000 mL | PREFILLED_SYRINGE | INTRAMUSCULAR | Status: AC
  Administered 2018-09-19: 0.5 mL via INTRAMUSCULAR
  Filled 2018-09-18: qty 0.5

## 2018-09-18 MED ORDER — AMLODIPINE BESYLATE 5 MG PO TABS
5.0000 mg | ORAL_TABLET | Freq: Every day | ORAL | Status: DC
  Administered 2018-09-18: 5 mg via ORAL
  Filled 2018-09-18: qty 1

## 2018-09-18 MED ORDER — POTASSIUM CHLORIDE CRYS ER 20 MEQ PO TBCR
40.0000 meq | EXTENDED_RELEASE_TABLET | Freq: Once | ORAL | Status: AC
  Administered 2018-09-18: 40 meq via ORAL
  Filled 2018-09-18: qty 2

## 2018-09-18 MED ORDER — ONDANSETRON HCL 4 MG/2ML IJ SOLN: 4.0000 mg | Freq: Four times a day (QID) | INTRAMUSCULAR | Status: DC | PRN

## 2018-09-18 MED ORDER — ENOXAPARIN SODIUM 40 MG/0.4ML ~~LOC~~ SOLN
40.0000 mg | SUBCUTANEOUS | Status: DC
  Administered 2018-09-19: 40 mg via SUBCUTANEOUS
  Filled 2018-09-18: qty 0.4

## 2018-09-18 MED ORDER — ATORVASTATIN CALCIUM 20 MG PO TABS
20.0000 mg | ORAL_TABLET | Freq: Every day | ORAL | Status: DC
  Administered 2018-09-18 – 2018-09-19 (×2): 20 mg via ORAL
  Filled 2018-09-18 (×2): qty 1

## 2018-09-18 MED ORDER — IBRUTINIB 420 MG PO TABS: 420.0000 mg | ORAL_TABLET | Freq: Every day | ORAL | Status: DC

## 2018-09-18 MED ORDER — HYDRALAZINE HCL 25 MG PO TABS
25.0000 mg | ORAL_TABLET | Freq: Three times a day (TID) | ORAL | Status: DC
  Administered 2018-09-18 (×3): 25 mg via ORAL
  Filled 2018-09-18 (×3): qty 1

## 2018-09-18 MED ORDER — PREDNISONE 20 MG PO TABS
40.0000 mg | ORAL_TABLET | Freq: Every day | ORAL | Status: DC
  Administered 2018-09-18 – 2018-09-19 (×2): 40 mg via ORAL
  Filled 2018-09-18 (×2): qty 2

## 2018-09-18 MED ORDER — ONDANSETRON HCL 4 MG PO TABS: 4.0000 mg | ORAL_TABLET | Freq: Four times a day (QID) | ORAL | Status: DC | PRN

## 2018-09-18 MED ORDER — ENOXAPARIN SODIUM 30 MG/0.3ML ~~LOC~~ SOLN
30.0000 mg | SUBCUTANEOUS | Status: DC
  Administered 2018-09-18: 30 mg via SUBCUTANEOUS
  Filled 2018-09-18: qty 0.3

## 2018-09-18 NOTE — Care Management Note (Signed)
Case Management Note  Patient Details  Name: JONITA HIROTA MRN: [id number] Date of Birth: 07/31/1929  Subjective/Objective:     Patient is from home alone.  Placed in observation for left hip pain.  Patient also has periods of bradycardia.  Starting on steroids.  Independent in all adls, denies issues accessing medical care, obtaining medications or with transportation.  Current with PCP.  No discharge needs identified at present by care manager or members of care team                 Action/Plan:   Expected Discharge Date:                  Expected Discharge Plan:  Home/Self Care  In-House Referral:     Discharge planning Services  CM Consult  Post Acute Care Choice:    Choice offered to:     DME Arranged:    DME Agency:     HH Arranged:    HH Agency:     Status of Service:  In process, will continue to follow  If discussed at Long Length of Stay Meetings, dates discussed:    Additional Comments:  Elza Rafter, RN 09/18/2018, 11:43 AM

## 2018-09-18 NOTE — Progress Notes (Addendum)
The patient has no complaints. Sinus bradycardia is better.  Vital signs are stable.  Physical examination is unremarkable except irregular heart rate.  Lab reviewed. A/P: Bradycardia, possible due to Lopressor, Hold Lopressor.    No indication for pacemaker at this time and follow-up echo per Dr. Saunders Revel Hypokalemia.  Potassium supplement.  Magnesium is normal. Back pain  Continue prednisone and the pain control.  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  Time spent 20 minutes.

## 2018-09-18 NOTE — ED Notes (Addendum)
ED TO INPATIENT HANDOFF REPORT  Name/Age/Gender Kristy Solomon 84 y.o. female  Code Status Code Status History    Date Active Date Inactive Code Status Order ID Comments User Context   12/28/2017 2226 12/29/2017 2048 Full Code [id number]  Demetrios Loll, MD Inpatient   03/17/2016 0920 03/20/2016 1637 Full Code [id number]  Demetrios Loll, MD Inpatient      Home/SNF/Other home  Chief Complaint Hip pain  Level of Care/Admitting Diagnosis ED Disposition    ED Disposition Condition [place]: [place] [782956]  Level of Care: Med-Surg [16]  Diagnosis: Bigeminy [213086]  Admitting Physician: Lance Coon [[id number]]  Attending Physician: Lance Coon [phone]  Bed request comments: 2a  PT Class (Do Not Modify): Observation [104]  PT Acc Code (Do Not Modify): Observation [10022]       Medical History Past Medical History:  Diagnosis Date  . 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 Hyperlipidemia   . Hypertension   . 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 Renal insufficiency     Allergies No Known Allergies  IV Location/Drains/Wounds Patient Lines/Drains/Airways Status   Active Line/Drains/Airways    Name:   Placement date:   Placement time:   Site:   Days:   Peripheral IV 09/17/18 Right Antecubital   09/17/18    2251    Antecubital   1   Pressure Ulcer 03/17/16 Stage II -  Partial thickness loss of dermis presenting as a shallow open ulcer with a red, pink wound bed without slough. pink   03/17/16    0910     915   Wound / Incision (Open or Dehisced) 03/18/16 Other (Comment) Hip Right   03/18/16    -    Hip   914   Wound / Incision (Open or Dehisced) 03/18/16 Other (Comment) Coccyx Medial   03/18/16    -    Coccyx   914          Labs/Imaging Results for orders placed or performed during the hospital encounter of 09/17/18 (from the past 48 hour(s))  Basic metabolic panel     Status: Abnormal   Collection  Time: 09/17/18 10:52 PM  Result Value Ref Range   Sodium 137 135 - 145 mmol/L   Potassium 3.6 3.5 - 5.1 mmol/L   Chloride 108 98 - 111 mmol/L   CO2 22 22 - 32 mmol/L   Glucose, Bld 98 70 - 99 mg/dL   BUN 33 (H) 8 - 23 mg/dL   Creatinine, Ser 1.49 (H) 0.44 - 1.00 mg/dL   Calcium 8.2 (L) 8.9 - 10.3 mg/dL   GFR calc non Af Amer 31 (L) >60 mL/min   GFR calc Af Amer 36 (L) >60 mL/min   Anion gap 7 5 - 15    Comment: Performed at [facility], [address] [place], [place] [address]  CBC with Differential     Status: Abnormal   Collection Time: 09/17/18 10:52 PM  Result Value Ref Range   WBC 5.6 4.0 - 10.5 K/uL   RBC 5.11 3.87 - 5.11 MIL/uL   Hemoglobin 13.1 12.0 - 15.0 g/dL   HCT 42.5 36.0 - 46.0 %   MCV 83.2 80.0 - 100.0 fL   MCH 25.6 (L) 26.0 - 34.0 pg   MCHC 30.8 30.0 - 36.0 g/dL   RDW 16.1 (H) 11.5 - 15.5 %   Platelets 87 (L) 150 - 400 K/uL    Comment: Immature  Platelet Fraction may be clinically indicated, consider ordering this additional test [id number]    nRBC 0.0 0.0 - 0.2 %   Neutrophils Relative % 60 %   Neutro Abs 3.4 1.7 - 7.7 K/uL   Lymphocytes Relative 28 %   Lymphs Abs 1.6 0.7 - 4.0 K/uL   Monocytes Relative 10 %   Monocytes Absolute 0.5 0.1 - 1.0 K/uL   Eosinophils Relative 0 %   Eosinophils Absolute 0.0 0.0 - 0.5 K/uL   Basophils Relative 1 %   Basophils Absolute 0.0 0.0 - 0.1 K/uL   WBC Morphology HYPERSEGMENTED NEUT    Smear Review PLATELET CLUMPS NOTED ON SMEAR, UNABLE TO ESTIMATE    Immature Granulocytes 1 %   Abs Immature Granulocytes 0.05 0.00 - 0.07 K/uL   Polychromasia PRESENT     Comment: Performed at [facility], [address] [place], [place] [address]   Dg Pelvis 1-2 Views  Result Date: 09/17/2018 CLINICAL DATA:  Left hip pain EXAM: PELVIS - 1-2 VIEW COMPARISON:  12/29/2017 radiographs, CT from 12/28/2017 FINDINGS: There is no evidence of pelvic fracture or diastasis. Facet arthropathy at L5-S1 with joint space  narrowing sclerosis. No pelvic bone lesions are seen. IMPRESSION: 1. No radiographic findings for the patient's left hip pain. 2. No acute fracture nor joint dislocation. 3. Lower lumbar facet arthropathy. Electronically Signed   By: Ashley Royalty M.D.   On: 09/17/2018 23:02    Pending Labs Unresulted Labs (From admission, onward)    Start     Ordered   Signed and Held  CBC  (enoxaparin (LOVENOX)    CrCl >/= 30 ml/min)  Once,   R    Comments:  Baseline for enoxaparin therapy IF NOT ALREADY DRAWN.  Notify MD if PLT < 100 K.    Signed and Held   Signed and Held  Creatinine, serum  (enoxaparin (LOVENOX)    CrCl >/= 30 ml/min)  Once,   R    Comments:  Baseline for enoxaparin therapy IF NOT ALREADY DRAWN.    Signed and Held   Signed and Held  Creatinine, serum  (enoxaparin (LOVENOX)    CrCl >/= 30 ml/min)  Weekly,   R    Comments:  while on enoxaparin therapy    Signed and Held   Signed and Held  Basic metabolic panel  Tomorrow morning,   R     Signed and Held   Signed and Held  CBC  Tomorrow morning,   R     Signed and Held   Signed and Held  Troponin I - Once  Once,   R     Signed and Held          Vitals/Pain Today's Vitals   09/17/18 2230 09/17/18 2300 09/17/18 2330 09/18/18 0000  BP: (!) 221/69 (!) 159/76 (!) 152/70 (!) 162/68  Pulse: (!) 45 (!) 46 (!) 47 (!) 44  Resp: (!) 22 15 14 14   Temp:      SpO2: 98% 98% 97% 97%  Weight:      Height:      PainSc:        Isolation Precautions No active isolations  Medications Medications  predniSONE (DELTASONE) tablet 40 mg (has no administration in time range)    Followed by  predniSONE (DELTASONE) tablet 20 mg (has no administration in time range)    Followed by  predniSONE (DELTASONE) tablet 10 mg (has no administration in time range)    Mobility Moderate assist

## 2018-09-18 NOTE — Progress Notes (Signed)
Changed Enoxaparin 30 mg SQ daily to 40 mg SQ daily as patient wt > 45 kg and CrCl > 30 mL/min   Paticia Stack, PharmD Pharmacy Resident  09/18/2018 8:26 AM

## 2018-09-18 NOTE — Progress Notes (Signed)
*  PRELIMINARY RESULTS* Echocardiogram 2D Echocardiogram has been performed.  Kristy Solomon 09/18/2018, 2:59 PM

## 2018-09-18 NOTE — Progress Notes (Signed)
Advanced Care Plan.  Purpose of Encounter: CODE STATUS. Parties in Attendance: The patient and me. Patient's Decisional Capacity: Yes. Medical Story: Kristy Solomon  is a 84 y.o. female  with history of hypertension, hyperlipidemia, renal insufficiency, malignant lymphoma and anemia.  The patient is admitted for bradycardia and back pain.  I discussed with the patient about her current condition, prognosis and CODE STATUS.  She wants to be resuscitated and intubated if she has cardiopulmonary arrest. Plan:  Code Status: Full code. Time spent discussing advance care planning: 16-17 minutes.

## 2018-09-18 NOTE — Plan of Care (Signed)
  Problem: Spiritual Needs Goal: Ability to function at adequate level Outcome: Progressing   Problem: Education: Goal: Knowledge of General Education information will improve Description Including pain rating scale, medication(s)/side effects and non-pharmacologic comfort measures Outcome: Progressing   Problem: Safety: Goal: Ability to remain free from injury will improve Outcome: Progressing

## 2018-09-18 NOTE — H&P (Signed)
[place] at [place] NAME: Kristy Solomon    MR#:  [id number]  DATE OF BIRTH:  05-27-1929  DATE OF ADMISSION:  09/17/2018  PRIMARY CARE PHYSICIAN: Jodi Marble, MD   REQUESTING/REFERRING PHYSICIAN: Cinda Quest, MD  CHIEF COMPLAINT:   Chief Complaint  Patient presents with  . Hip Pain    HISTORY OF PRESENT ILLNESS:  Kristy Solomon  is a 84 y.o. female who presents with chief complaint as above.  Patient presents to the ED for left low back/lateral hip pain.  Imaging in the ED shows no acute explanation for her pain.  She was seen over the weekend and given 10 mg of prednisone daily for 3 days, which she states has not helped.  While here in the ED she was found to be bradycardic and in intermittent bigeminy.  She is on Lopressor 25 mg twice daily, but also states that she has never been told she had any issues with arrhythmias or bradycardia.  Hospitalist were called for evaluation  PAST MEDICAL HISTORY:   Past Medical History:  Diagnosis Date  . 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 Hyperlipidemia   . Hypertension   . 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 Renal insufficiency      PAST SURGICAL HISTORY:   Past Surgical History:  Procedure Laterality Date  . ABDOMINAL HYSTERECTOMY    . HEMORRHOID SURGERY       SOCIAL HISTORY:   Social History   Tobacco Use  . Smoking status: Never Smoker  . Smokeless tobacco: Never Used  Substance Use Topics  . Alcohol use: No     FAMILY HISTORY:   Family History  Problem Relation Age of Onset  . Cancer Brother        throat ca  . Cancer Brother        bone cancer     DRUG ALLERGIES:  No Known Allergies  MEDICATIONS AT HOME:   Prior to Admission medications   Medication Sig Start Date End Date Taking? Authorizing Provider  allopurinol (ZYLOPRIM) 100 MG tablet Take 2 tablets (200 mg total) by mouth daily. 09/11/18  Yes Karen Kitchens, NP   atorvastatin (LIPITOR) 20 MG tablet Take 20 mg by mouth daily. 12/24/13  Yes [provider]  Calcium Citrate-Vitamin D 315-250 MG-UNIT TABS Take 1 tablet by mouth daily.  06/27/12  Yes [provider]  folic acid (FOLVITE) 1 MG tablet TAKE 1 TABLET BY MOUTH ONCE DAILY 07/03/18  Yes Corcoran, Melissa C, MD  gabapentin (NEURONTIN) 300 MG capsule Take 300 mg by mouth 3 (three) times daily.    Yes [provider]  hydrALAZINE (APRESOLINE) 25 MG tablet Take 25 mg by mouth 3 (three) times daily.    Yes [provider]  IMBRUVICA 420 MG TABS TAKE 1 TABLET (420 MG) BY MOUTH DAILY. Patient taking differently: Take 420 mg by mouth daily.  07/04/18  Yes Karen Kitchens, NP  amLODipine-benazepril (LOTREL) 5-20 MG capsule Take 1 capsule by mouth daily.     [provider]  colchicine 0.6 MG tablet Take 0.6 mg by mouth as needed. For gout    [provider]  diclofenac sodium (VOLTAREN) 1 % GEL Apply 4 g topically 2 (two) times daily as needed. 09/15/18   Johnn Hai, PA-C  furosemide (LASIX) 20 MG tablet Take 20 mg by mouth daily.    [provider]  loperamide (IMODIUM A-D) 2 MG tablet Take  1 tablet (2 mg total) by mouth 4 (four) times daily as needed for diarrhea or loose stools. Patient not taking: Reported on 07/16/2018 12/29/17   Dustin Flock, MD  metoprolol tartrate (LOPRESSOR) 25 MG tablet Take 25 mg by mouth 2 (two) times daily. 03/05/16   [provider]  omeprazole (PRILOSEC) 20 MG capsule Take 20 mg by mouth daily. 03/02/16   [provider]  predniSONE (DELTASONE) 10 MG tablet Take 1 tablet once a day starting Sunday for 3 days Patient not taking: Reported on 09/18/2018 09/15/18   Johnn Hai, PA-C    REVIEW OF SYSTEMS:  Review of Systems  Constitutional: Negative for chills, fever, malaise/fatigue and weight loss.  HENT: Negative for ear pain, hearing loss and tinnitus.   Eyes: Negative for blurred vision,  double vision, pain and redness.  Respiratory: Negative for cough, hemoptysis and shortness of breath.   Cardiovascular: Negative for chest pain, palpitations, orthopnea and leg swelling.  Gastrointestinal: Negative for abdominal pain, constipation, diarrhea, nausea and vomiting.  Genitourinary: Negative for dysuria, frequency and hematuria.  Musculoskeletal: Positive for back pain. Negative for joint pain and neck pain.  Skin:       No acne, rash, or lesions  Neurological: Negative for dizziness, tremors, focal weakness and weakness.  Endo/Heme/Allergies: Negative for polydipsia. Does not bruise/bleed easily.  Psychiatric/Behavioral: Negative for depression. The patient is not nervous/anxious and does not have insomnia.      VITAL SIGNS:   Vitals:   09/17/18 2230 09/17/18 2300 09/17/18 2330 09/18/18 0000  BP: (!) 221/69 (!) 159/76 (!) 152/70 (!) 162/68  Pulse: (!) 45 (!) 46 (!) 47 (!) 44  Resp: (!) 22 15 14 14   Temp:      SpO2: 98% 98% 97% 97%  Weight:      Height:       Wt Readings from Last 3 Encounters:  09/17/18 92.1 kg  09/15/18 92.1 kg  09/11/18 92.2 kg    PHYSICAL EXAMINATION:  Physical Exam  Vitals reviewed. Constitutional: She is oriented to person, place, and time. She appears well-developed and well-nourished. No distress.  HENT:  Head: Normocephalic and atraumatic.  Mouth/Throat: Oropharynx is clear and moist.  Eyes: Pupils are equal, round, and reactive to light. Conjunctivae and EOM are normal. No scleral icterus.  Neck: Normal range of motion. Neck supple. No JVD present. No thyromegaly present.  Cardiovascular: Intact distal pulses. Exam reveals no gallop and no friction rub.  No murmur heard. Bradycardia, intermittent bigeminy  Respiratory: Effort normal and breath sounds normal. No respiratory distress. She has no wheezes. She has no rales.  GI: Soft. Bowel sounds are normal. She exhibits no distension. There is no abdominal tenderness.   Musculoskeletal: Normal range of motion.        General: Tenderness (Left lateral low back pain) present. No edema.     Comments: No arthritis, no gout  Lymphadenopathy:    She has no cervical adenopathy.  Neurological: She is alert and oriented to person, place, and time. No cranial nerve deficit.  No dysarthria, no aphasia  Skin: Skin is warm and dry. No rash noted. No erythema.  Psychiatric: She has a normal mood and affect. Her behavior is normal. Judgment and thought content normal.    LABORATORY PANEL:   CBC Recent Labs  Lab 09/17/18 2252  WBC 5.6  HGB 13.1  HCT 42.5  PLT 87*   ------------------------------------------------------------------------------------------------------------------  Chemistries  Recent Labs  Lab 09/11/18 0943 09/17/18 2252  NA 139  137  K 3.7 3.6  CL 107 108  CO2 25 22  GLUCOSE 99 98  BUN 28* 33*  CREATININE 1.47* 1.49*  CALCIUM 8.9 8.2*  AST 14*  --   ALT 14  --   ALKPHOS 42  --   BILITOT 0.7  --    ------------------------------------------------------------------------------------------------------------------  Cardiac Enzymes No results for input(s): TROPONINI in the last 168 hours. ------------------------------------------------------------------------------------------------------------------  RADIOLOGY:  Dg Pelvis 1-2 Views  Result Date: 09/17/2018 CLINICAL DATA:  Left hip pain EXAM: PELVIS - 1-2 VIEW COMPARISON:  12/29/2017 radiographs, CT from 12/28/2017 FINDINGS: There is no evidence of pelvic fracture or diastasis. Facet arthropathy at L5-S1 with joint space narrowing sclerosis. No pelvic bone lesions are seen. IMPRESSION: 1. No radiographic findings for the patient's left hip pain. 2. No acute fracture nor joint dislocation. 3. Lower lumbar facet arthropathy. Electronically Signed   By: Ashley Royalty M.D.   On: 09/17/2018 23:02    EKG:   Orders placed or performed during the hospital encounter of 09/17/18  . ED EKG   . ED EKG    IMPRESSION AND PLAN:  Principal Problem:   Bradycardia -unclear etiology, though it is possible she simply needs a reduction in her Lopressor dose.  Still, she has been on this dose for some time and reports not having had bradycardia prior to this.  We will get an echocardiogram and a cardiology consult Active Problems:   Back pain -the initial dose of prednisone was very low, we will increase this dose with a taper starting at 40 mg.  Imaging here did not show any acute pathology to explain her pain   HTN (hypertension) -home dose antihypertensives   HLD (hyperlipidemia) -Home dose antilipid   GERD (gastroesophageal reflux disease) -home dose PPI   Malignant lymphoma, lymphoplasmacytic (HCC) -continue chemotherapy at currently ordered dose  Chart review performed and case discussed with ED provider. Labs, imaging and/or ECG reviewed by provider and discussed with patient/family. Management plans discussed with the patient and/or family.  DVT PROPHYLAXIS: SubQ lovenox   GI PROPHYLAXIS:  PPI   ADMISSION STATUS: Observation  CODE STATUS: Full Code Status History    Date Active Date Inactive Code Status Order ID Comments User Context   12/28/2017 2226 12/29/2017 2048 Full Code [id number]  Demetrios Loll, MD Inpatient   03/17/2016 0920 03/20/2016 1637 Full Code [id number]  Demetrios Loll, MD Inpatient      TOTAL TIME TAKING CARE OF THIS PATIENT: 40 minutes.   Folashade Gamboa [place] 09/18/2018, 1:17 AM  [employer]  [phone]  CC: Primary care physician; Jodi Marble, MD  Note:  This document was prepared using Dragon voice recognition software and may include unintentional dictation errors.

## 2018-09-18 NOTE — Consult Note (Addendum)
Cardiology Consultation:   Patient ID: [name] MRN: [id number]; DOB: 24-Sep-1929  Admit date: 09/17/2018 Date of Consult: 09/18/2018  Primary Care Provider: Jodi Marble, MD Primary Cardiologist: New - Consult by Hayslee Casebolt. Primary Electrophysiologist:  None    Patient Profile:   Kristy Solomon is a 84 y.o. female with a hx of hypertension, hyperlipidemia, lymphoma, chronic kidney disease, and anemia who is being seen today for the evaluation of bradycardia at the request of Dr. Bridgett Larsson.  History of Present Illness:   Kristy Solomon was in her usual state of health until the last 3 to 4 days when she developed severe left lower flank/back pain attributed to her hip.  She was seen in the ED on 09/15/2018, where she was given a 3-day course of prednisone.  She reported continued pain, prompting her to return to the ED yesterday.  She was noted to be bradycardic with heart rates in the 40s at times.  She also had intermittent PVCs and episodes of ventricular bigeminy.  Kristy Solomon denies a history of prior cardiac disease.  She has not felt any palpitations or lightheadedness.  She also denies chest pain, shortness of breath, orthopnea, and edema.  She is on metoprolol at home (appears to have fallen of her medication list in Epic) as well as several other antihypertensive medications.  She last took her medicines yesterday before coming to the emergency department.  At this time, her only complaint is of continued left flank/low back and hip pain.  Past Medical History:  Diagnosis Date  . 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 Hyperlipidemia   . Hypertension   . 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 Renal insufficiency     Past Surgical History:  Procedure Laterality Date  . ABDOMINAL HYSTERECTOMY    . HEMORRHOID SURGERY       Home Medications:  Prior to Admission medications   Medication Sig Start Date Valine Drozdowski Date Taking? Authorizing Provider    allopurinol (ZYLOPRIM) 100 MG tablet Take 2 tablets (200 mg total) by mouth daily. 09/11/18  Yes Karen Kitchens, NP  atorvastatin (LIPITOR) 20 MG tablet Take 20 mg by mouth daily. 12/24/13  Yes [provider]  Calcium Citrate-Vitamin D 315-250 MG-UNIT TABS Take 1 tablet by mouth daily.  06/27/12  Yes [provider]  folic acid (FOLVITE) 1 MG tablet TAKE 1 TABLET BY MOUTH ONCE DAILY 07/03/18  Yes Corcoran, Melissa C, MD  gabapentin (NEURONTIN) 300 MG capsule Take 300 mg by mouth 3 (three) times daily.    Yes [provider]  hydrALAZINE (APRESOLINE) 25 MG tablet Take 25 mg by mouth 3 (three) times daily.    Yes [provider]  IMBRUVICA 420 MG TABS TAKE 1 TABLET (420 MG) BY MOUTH DAILY. Patient taking differently: Take 420 mg by mouth daily.  07/04/18  Yes Karen Kitchens, NP  colchicine 0.6 MG tablet Take 0.6 mg by mouth as needed (gout).     [provider]  diclofenac sodium (VOLTAREN) 1 % GEL Apply 4 g topically 2 (two) times daily as needed. 09/15/18   Johnn Hai, PA-C    Inpatient Medications: Scheduled Meds: . amLODipine  5 mg Oral Daily  . atorvastatin  20 mg Oral Daily  . benazepril  20 mg Oral Daily  . [START ON 09/19/2018] enoxaparin (LOVENOX) injection  40 mg Subcutaneous Q24H  . gabapentin  300 mg Oral TID  . hydrALAZINE  25 mg Oral TID  . Ibrutinib  420 mg Oral Daily  . [START ON 09/19/2018] Influenza vac split quadrivalent PF  0.5 mL Intramuscular Tomorrow-1000  . pantoprazole  40 mg Oral Daily  . [START ON 09/19/2018] pneumococcal 23 valent vaccine  0.5 mL Intramuscular Tomorrow-1000  . predniSONE  40 mg Oral Q breakfast   Followed by  . [START ON 09/21/2018] predniSONE  20 mg Oral Q breakfast   Followed by  . [START ON 09/23/2018] predniSONE  10 mg Oral Q breakfast   Continuous Infusions:  PRN Meds: acetaminophen **OR** acetaminophen, ondansetron **OR** ondansetron (ZOFRAN) IV  Allergies:   No Known  Allergies  Social History:   Social History   Tobacco Use  . Smoking status: Never Smoker  . Smokeless tobacco: Never Used  Substance Use Topics  . Alcohol use: No  . Drug use: No     Family History:   Family History  Problem Relation Age of Onset  . Cancer Brother        throat ca  . Cancer Brother        bone cancer     ROS:  Please see the history of present illness. All other ROS reviewed and negative.     Physical Exam/Data:   Vitals:   09/18/18 0227 09/18/18 0252 09/18/18 0514 09/18/18 0737  BP: (!) 71/57 134/82 (!) 145/72 137/81  Pulse: (!) 48  (!) 45 (!) 49  Resp:    18  Temp: (!) 97.5 F (36.4 C)   98.2 F (36.8 C)  TempSrc: Oral     SpO2: (!) 60%  95% 99%  Weight: 91.3 kg     Height: 5\' 3"  (1.6 m)       Intake/Output Summary (Last 24 hours) at 09/18/2018 1155 Last data filed at 09/18/2018 1021 Gross per 24 hour  Intake -  Output 1000 ml  Net -1000 ml   Filed Weights   09/17/18 2201 09/18/18 0227  Weight: 92.1 kg 91.3 kg   Body mass index is 35.66 kg/m.  General:  Well nourished, well developed, in no acute distress HEENT: normal Lymph: no adenopathy Neck: no JVD Endocrine:  No thryomegaly Vascular: No carotid bruits; FA pulses 2+ bilaterally without bruits  Cardiac:  Bradycardic but regular without murmurs, rubs, or gallops. Lungs:  clear to auscultation bilaterally, no wheezing, rhonchi or rales  Abd: soft, nontender, no hepatomegaly  Ext: no edema Musculoskeletal:  No deformities, BUE and BLE strength normal and equal.  Pain in the left flank/hip noted when trying to sit up. Skin: warm and dry  Neuro:  CNs 2-12 intact, no focal abnormalities noted Psych:  Normal affect   EKG:  Not available for review.  (Addendum: EKG located.  This demonstrates sinus bradycardia with LAFB and LVH). Telemetry:  Telemetry was personally reviewed and demonstrates:  Sinus bradycardia with PAC's and PVC's, including runs of ventricular  bigeminy.  Relevant CV Studies: TTE (03/19/16): - Left ventricle: The cavity size was normal. Wall thickness was   increased in a pattern of moderate LVH. Systolic function was   vigorous. The estimated ejection fraction was in the range of 65%   to 70%. Wall motion was normal; there were no regional wall   motion abnormalities. Doppler parameters are consistent with   abnormal left ventricular relaxation (grade 1 diastolic   dysfunction). - Mitral valve: There was mild regurgitation. - Left atrium: The atrium was mildly dilated. - Right atrium: The atrium was dilated. - Tricuspid valve: There was mild-moderate regurgitation. - Pulmonary arteries: Systolic  pressure was mildly to moderately   increased. PA peak pressure: 53 mm Hg (S). - Pericardium, extracardiac: A trivial pericardial effusion was   identified circumferential to the heart.  Laboratory Data:  Chemistry Recent Labs  Lab 09/17/18 2252 09/18/18 0255  NA 137 139  K 3.6 3.3*  CL 108 108  CO2 22 23  GLUCOSE 98 92  BUN 33* 28*  CREATININE 1.49* 1.30*  CALCIUM 8.2* 8.4*  GFRNONAA 31* 37*  GFRAA 36* 43*  ANIONGAP 7 8    No results for input(s): PROT, ALBUMIN, AST, ALT, ALKPHOS, BILITOT in the last 168 hours. Hematology Recent Labs  Lab 09/17/18 2252 09/18/18 0255  WBC 5.6 4.3  RBC 5.11 5.64*  HGB 13.1 14.2  HCT 42.5 46.5*  MCV 83.2 82.4  MCH 25.6* 25.2*  MCHC 30.8 30.5  RDW 16.1* 16.1*  PLT 87* 80*   Cardiac Enzymes Recent Labs  Lab 09/18/18 0255  TROPONINI 0.03*   No results for input(s): TROPIPOC in the last 168 hours.  BNPNo results for input(s): BNP, PROBNP in the last 168 hours.  DDimer No results for input(s): DDIMER in the last 168 hours.  Radiology/Studies:  Dg Pelvis 1-2 Views  Result Date: 09/17/2018 CLINICAL DATA:  Left hip pain EXAM: PELVIS - 1-2 VIEW COMPARISON:  12/29/2017 radiographs, CT from 12/28/2017 FINDINGS: There is no evidence of pelvic fracture or diastasis. Facet  arthropathy at L5-S1 with joint space narrowing sclerosis. No pelvic bone lesions are seen. IMPRESSION: 1. No radiographic findings for the patient's left hip pain. 2. No acute fracture nor joint dislocation. 3. Lower lumbar facet arthropathy. Electronically Signed   By: Ashley Royalty M.D.   On: 09/17/2018 23:02   Dg Hip Unilat W Or Wo Pelvis 2-3 Views Left  Result Date: 09/15/2018 CLINICAL DATA:  84 year old female with left hip pain for the past 4 days. No fall or recent injury. EXAM: DG HIP (WITH OR WITHOUT PELVIS) 2-3V LEFT COMPARISON:  CT scan of the abdomen and pelvis 07/11/2018 FINDINGS: There is no evidence of hip fracture or dislocation. There is no evidence of arthropathy or other focal bone abnormality. Atherosclerotic vascular calcifications noted incidentally. IMPRESSION: No acute fracture or malalignment. Scattered atherosclerotic vascular calcifications. Electronically Signed   By: Jacqulynn Cadet M.D.   On: 09/15/2018 11:14    Assessment and Plan:   Sinus bradycardia Not new; EKG on 07/18/16 shows sinus bradycardia with HR in the 50's.  No EKG available for review during this admission but telemetry shows some sinus bradycardia with HR in the upper 40's.  Frequent PVC's may also be contributing to apparent bradycardia on pulse checks.  Patient is asymptomatic.  Echocardiogram is pending.  Follow-up echocardiogram.  Obtain TSH.  Continue to hold metoprolol and other AV-nodal blocking agents.  No indication for pacemaker at this time.  Continue telemetry monitoring overnight.  Left hip/flank pain  Per internal medicine.    For questions or updates, please contact [place] Please consult [email or web address] for contact info under [facility] Cardiology.    Signed, Nelva Bush, MD  09/18/2018 11:55 AM

## 2018-09-18 NOTE — Care Management Obs Status (Signed)
[place] NOTIFICATION   Patient Details  Name: Kristy Solomon MRN: [id number] Date of Birth: Sep 09, 1929   Medicare Observation Status Notification Given:  Yes    Elza Rafter, RN 09/18/2018, 11:35 AM

## 2018-09-19 DIAGNOSIS — M545 Low back pain: Secondary | ICD-10-CM | POA: Diagnosis not present

## 2018-09-19 DIAGNOSIS — I493 Ventricular premature depolarization: Secondary | ICD-10-CM | POA: Diagnosis not present

## 2018-09-19 DIAGNOSIS — R001 Bradycardia, unspecified: Secondary | ICD-10-CM | POA: Diagnosis not present

## 2018-09-19 DIAGNOSIS — C8300 Small cell B-cell lymphoma, unspecified site: Secondary | ICD-10-CM

## 2018-09-19 DIAGNOSIS — E782 Mixed hyperlipidemia: Secondary | ICD-10-CM | POA: Diagnosis not present

## 2018-09-19 DIAGNOSIS — I10 Essential (primary) hypertension: Secondary | ICD-10-CM

## 2018-09-19 LAB — BASIC METABOLIC PANEL
Anion gap: 6 (ref 5–15)
BUN: 40 mg/dL — ABNORMAL HIGH (ref 8–23)
CALCIUM: 8.3 mg/dL — AB (ref 8.9–10.3)
CO2: 23 mmol/L (ref 22–32)
Chloride: 109 mmol/L (ref 98–111)
Creatinine, Ser: 1.74 mg/dL — ABNORMAL HIGH (ref 0.44–1.00)
GFR calc Af Amer: 30 mL/min — ABNORMAL LOW (ref >60)
GFR calc non Af Amer: 26 mL/min — ABNORMAL LOW (ref >60)
Glucose, Bld: 157 mg/dL — ABNORMAL HIGH (ref 70–99)
Potassium: 3.9 mmol/L (ref 3.5–5.1)
Sodium: 138 mmol/L (ref 135–145)

## 2018-09-19 MED ORDER — ENOXAPARIN SODIUM 30 MG/0.3ML ~~LOC~~ SOLN: 30.0000 mg | SUBCUTANEOUS | Status: DC

## 2018-09-19 MED ORDER — SODIUM CHLORIDE 0.9 % IV SOLN
INTRAVENOUS | Status: DC
  Administered 2018-09-19: 75 mL/h via INTRAVENOUS

## 2018-09-19 MED ORDER — PREDNISONE 10 MG PO TABS: ORAL_TABLET | ORAL | 0 refills | Status: DC

## 2018-09-19 NOTE — Discharge Summary (Addendum)
[facility] [place] NAME: Kristy Solomon    MR#:  [id number]  DATE OF BIRTH:  01/30/1929  DATE OF ADMISSION:  09/17/2018   ADMITTING PHYSICIAN: Lance Coon, MD  DATE OF DISCHARGE: 09/19/2018  PRIMARY CARE PHYSICIAN: Jodi Marble, MD   ADMISSION DIAGNOSIS:  Bradycardia [R00.1] Left hip pain [M25.552] DISCHARGE DIAGNOSIS:  Principal Problem:   Bradycardia Active Problems:   Malignant lymphoma, lymphoplasmacytic ([facility])   HLD (hyperlipidemia)   HTN (hypertension)   GERD (gastroesophageal reflux disease)   Back pain   Bigeminy  SECONDARY DIAGNOSIS:   Past Medical History:  Diagnosis Date  . 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 Hyperlipidemia   . Hypertension   . 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 Renal insufficiency    HOSPITAL COURSE:  Bradycardia, better. Hold Lopressor.   Echo showed ejection fraction 60 to 65%.  No indication for pacemaker at this time per Dr. Rockey Situ.  Hypokalemia.    Improved with potassium supplement.  Magnesium is normal.  ARF on CKD stage III.  She has been treated with IV fluid support.  Follow-up BMP with PCP.  Back pain  Continue prednisone and the pain control.  Follow-up orthopedic surgeon Dr. Posey Pronto as outpatient.  HTN (hypertension) -blood pressure is soft, no need hypertension medication. HLD (hyperlipidemia) -Home dose antilipid GERD (gastroesophageal reflux disease) -home dose PPI Malignant lymphoma, lymphoplasmacytic (HCC) -continue chemotherapy at currently ordered dose Weakness and back pain.  Need home health and PT. I discussed with Dr. Rockey Situ. DISCHARGE CONDITIONS:  Stable, discharge to home with home health and PT today. CONSULTS OBTAINED:  Treatment Team:  Nelva Bush, MD DRUG ALLERGIES:  No Known Allergies DISCHARGE MEDICATIONS:   Allergies as of 09/19/2018   No Known Allergies     Medication List    STOP taking these  medications   hydrALAZINE 25 MG tablet Commonly known as:  APRESOLINE     TAKE these medications   allopurinol 100 MG tablet Commonly known as:  ZYLOPRIM Take 2 tablets (200 mg total) by mouth daily.   atorvastatin 20 MG tablet Commonly known as:  LIPITOR Take 20 mg by mouth daily.   Calcium Citrate-Vitamin D 315-250 MG-UNIT Tabs Take 1 tablet by mouth daily.   colchicine 0.6 MG tablet Take 0.6 mg by mouth as needed (gout).   diclofenac sodium 1 % Gel Commonly known as:  VOLTAREN Apply 4 g topically 2 (two) times daily as needed.   folic acid 1 MG tablet Commonly known as:  FOLVITE TAKE 1 TABLET BY MOUTH ONCE DAILY   gabapentin 300 MG capsule Commonly known as:  NEURONTIN Take 300 mg by mouth 3 (three) times daily.   IMBRUVICA 420 MG Tabs Generic drug:  Ibrutinib TAKE 1 TABLET (420 MG) BY MOUTH DAILY. What changed:  See the new instructions.   predniSONE 10 MG tablet Commonly known as:  DELTASONE 20 mg po daily for 2 days, then, 10 mg po daily for 2 days.        DISCHARGE INSTRUCTIONS:  See AVS.  If you experience worsening of your admission symptoms, develop shortness of breath, life threatening emergency, suicidal or homicidal thoughts you must seek medical attention immediately by calling 911 or calling your MD immediately  if symptoms less severe.  You Must read complete instructions/literature along with all the possible adverse reactions/side effects for all the Medicines you take and that have been prescribed to you. Take any new Medicines after  you have completely understood and accpet all the possible adverse reactions/side effects.   Please note  You were cared for by a hospitalist during your hospital stay. If you have any questions about your discharge medications or the care you received while you were in the hospital after you are discharged, you can call the unit and asked to speak with the hospitalist on call if the hospitalist that took care of  you is not available. Once you are discharged, your primary care physician will handle any further medical issues. Please note that NO REFILLS for any discharge medications will be authorized once you are discharged, as it is imperative that you return to your primary care physician (or establish a relationship with a primary care physician if you do not have one) for your aftercare needs so that they can reassess your need for medications and monitor your lab values.    On the day of Discharge:  VITAL SIGNS:  Blood pressure (!) 128/50, pulse (!) 50, temperature 98.2 F (36.8 C), temperature source Oral, resp. rate 16, height 5\' 3"  (1.6 m), weight 91.3 kg, SpO2 95 %. PHYSICAL EXAMINATION:  GENERAL:  84 y.o.-year-old patient lying in the bed with no acute distress.  EYES: Pupils equal, round, reactive to light and accommodation. No scleral icterus. Extraocular muscles intact.  HEENT: Head atraumatic, normocephalic. Oropharynx and nasopharynx clear.  NECK:  Supple, no jugular venous distention. No thyroid enlargement, no tenderness.  LUNGS: Normal breath sounds bilaterally, no wheezing, rales,rhonchi or crepitation. No use of accessory muscles of respiration.  CARDIOVASCULAR: S1, S2 normal. No murmurs, rubs, or gallops.  ABDOMEN: Soft, non-tender, non-distended. Bowel sounds present. No organomegaly or mass.  EXTREMITIES: No pedal edema, cyanosis, or clubbing.  NEUROLOGIC: Cranial nerves II through XII are intact. Muscle strength 5/5 in all extremities. Sensation intact. Gait not checked.  PSYCHIATRIC: The patient is alert and oriented x 3.  SKIN: No obvious rash, lesion, or ulcer.  DATA REVIEW:   CBC Recent Labs  Lab 09/18/18 0255  WBC 4.3  HGB 14.2  HCT 46.5*  PLT 80*    Chemistries  Recent Labs  Lab 09/18/18 0255 09/19/18 0452  NA 139 138  K 3.3* 3.9  CL 108 109  CO2 23 23  GLUCOSE 92 157*  BUN 28* 40*  CREATININE 1.30* 1.74*  CALCIUM 8.4* 8.3*  MG 1.9  --       Microbiology Results  Results for orders placed or performed during the hospital encounter of 12/28/17  Blood Culture (routine x 2)     Status: None   Collection Time: 12/28/17  4:49 PM  Result Value Ref Range Status   Specimen Description BLOOD RIGHT ANTECUBITAL  Final   Special Requests   Final    BOTTLES DRAWN AEROBIC AND ANAEROBIC Blood Culture adequate volume   Culture   Final    NO GROWTH 5 DAYS Performed at [facility], [address] [place], [place] [address]    Report Status 01/02/2018 FINAL  Final  Blood Culture (routine x 2)     Status: None   Collection Time: 12/28/17  4:49 PM  Result Value Ref Range Status   Specimen Description BLOOD BLOOD LEFT HAND  Final   Special Requests   Final    BOTTLES DRAWN AEROBIC AND ANAEROBIC Blood Culture results may not be optimal due to an excessive volume of blood received in culture bottles   Culture   Final    NO GROWTH 5 DAYS Performed  at [facility], [address] [place], [place] [address]    Report Status 01/02/2018 FINAL  Final    RADIOLOGY:  No results found.   Management plans discussed with the patient, family and they are in agreement.  CODE STATUS: Full Code   TOTAL TIME TAKING CARE OF THIS PATIENT: 32 minutes.    Demetrios Loll M.D on 09/19/2018 at 3:45 PM  Between 7am to 6pm - Pager - [phone]  After 6pm go to [email or web address] - Proofreader  [facility]  Office  [phone]  CC: Primary care physician; Jodi Marble, MD   Note: This dictation was prepared with Dragon dictation along with smaller phrase technology. Any transcriptional errors that result from this process are unintentional.

## 2018-09-19 NOTE — Progress Notes (Signed)
Progress Note  Patient Name: Kristy Solomon Date of Encounter: 09/19/2018  Primary Cardiologist: [facility]- Dr. END  Subjective   Reports hip pain is much improved Overall feels well No recent bowel movements, has not been ambulating out of the bed Review of telemetry shows improved heart rate, still with frequent PVCs She is asymptomatic reports that she feels well She does report blood pressure was elevated at home in the past Blood pressure medication has been held in the hospital with systolic pressures up to high 161 systolic  Inpatient Medications    Scheduled Meds: . atorvastatin  20 mg Oral Daily  . [START ON 09/20/2018] enoxaparin (LOVENOX) injection  30 mg Subcutaneous Q24H  . gabapentin  300 mg Oral TID  . Ibrutinib  420 mg Oral Daily  . pantoprazole  40 mg Oral Daily  . predniSONE  40 mg Oral Q breakfast   Followed by  . [START ON 09/21/2018] predniSONE  20 mg Oral Q breakfast   Followed by  . [START ON 09/23/2018] predniSONE  10 mg Oral Q breakfast   Continuous Infusions: . sodium chloride 75 mL/hr (09/19/18 1023)   PRN Meds: acetaminophen **OR** acetaminophen, ondansetron **OR** ondansetron (ZOFRAN) IV   Vital Signs    Vitals:   09/18/18 2310 09/19/18 0539 09/19/18 0743 09/19/18 1716  BP: 114/61 94/60 (!) 128/50 119/62  Pulse: (!) 50 (!) 54 (!) 50 (!) 58  Resp:   16 20  Temp: 98.2 F (36.8 C) 98.4 F (36.9 C) 98.2 F (36.8 C) 98.5 F (36.9 C)  TempSrc: Oral Oral Oral Oral  SpO2: 100% 100% 95% 99%  Weight:      Height:        Intake/Output Summary (Last 24 hours) at 09/19/2018 1751 Last data filed at 09/19/2018 1300 Gross per 24 hour  Intake 600 ml  Output 100 ml  Net 500 ml   Filed Weights   09/17/18 2201 09/18/18 0227  Weight: 92.1 kg 91.3 kg    Telemetry    Sinus bradycardia with PVCs- Personally Reviewed  ECG      Physical Exam  Constitutional:  oriented to person, place, and time. No distress.  Obese HENT:  Head: Grossly  normal Eyes:  no discharge. No scleral icterus.  Neck: No JVD, no carotid bruits  Cardiovascular: Regular rate and rhythm, no murmurs appreciated Ectopy appreciated Pulmonary/Chest: Clear to auscultation bilaterally, no wheezes or rails Abdominal: Soft.  no distension.  no tenderness.  Musculoskeletal: Normal range of motion Neurological:  normal muscle tone. Coordination normal. No atrophy Skin: Skin warm and dry Psychiatric: normal affect, pleasant   Labs    Chemistry Recent Labs  Lab 09/17/18 2252 09/18/18 0255 09/19/18 0452  NA 137 139 138  K 3.6 3.3* 3.9  CL 108 108 109  CO2 22 23 23   GLUCOSE 98 92 157*  BUN 33* 28* 40*  CREATININE 1.49* 1.30* 1.74*  CALCIUM 8.2* 8.4* 8.3*  GFRNONAA 31* 37* 26*  GFRAA 36* 43* 30*  ANIONGAP 7 8 6      Hematology Recent Labs  Lab 09/17/18 2252 09/18/18 0255  WBC 5.6 4.3  RBC 5.11 5.64*  HGB 13.1 14.2  HCT 42.5 46.5*  MCV 83.2 82.4  MCH 25.6* 25.2*  MCHC 30.8 30.5  RDW 16.1* 16.1*  PLT 87* 80*    Cardiac Enzymes Recent Labs  Lab 09/18/18 0255  TROPONINI 0.03*   No results for input(s): TROPIPOC in the last 168 hours.   BNPNo results for input(s):  BNP, PROBNP in the last 168 hours.   DDimer No results for input(s): DDIMER in the last 168 hours.   Radiology    Dg Pelvis 1-2 Views  Result Date: 09/17/2018 CLINICAL DATA:  Left hip pain EXAM: PELVIS - 1-2 VIEW COMPARISON:  12/29/2017 radiographs, CT from 12/28/2017 FINDINGS: There is no evidence of pelvic fracture or diastasis. Facet arthropathy at L5-S1 with joint space narrowing sclerosis. No pelvic bone lesions are seen. IMPRESSION: 1. No radiographic findings for the patient's left hip pain. 2. No acute fracture nor joint dislocation. 3. Lower lumbar facet arthropathy. Electronically Signed   By: Ashley Royalty M.D.   On: 09/17/2018 23:02    Cardiac Studies   Echocardiogram Left ventricle: The cavity size was normal. Wall thickness was   increased in a pattern of  moderate to severe LVH. Systolic   function was normal. The estimated ejection fraction was in the   range of 60% to 65%. Wall motion was normal; there were no   regional wall motion abnormalities. Doppler parameters are   consistent with abnormal left ventricular relaxation (grade 1   diastolic dysfunction). Doppler parameters are consistent with   high ventricular filling pressure. - Left atrium: The atrium was moderately dilated. - Right ventricle: The cavity size was normal. Wall thickness was   normal. Systolic function was normal. - Right atrium: The atrium was mildly dilated. - Tricuspid valve: There was moderate regurgitation. - Pericardium, extracardiac: A trivial pericardial effusion was   identified.  Home  Patient Profile     Kristy Solomon is a 84 y.o. female with a hx of hypertension, hyperlipidemia, lymphoma, chronic kidney disease, and anemia who is being seen today for the evaluation of bradycardia  Assessment & Plan    Sinus bradycardia Metoprolol held with improvement of rate out of the 40s now in the 50s PVCs likely contributing to erroneous pulse measurements, artificially low She is asymptomatic, blood pressure stable (holding blood pressure medication for low blood pressure) -Would not continue the metoprolol, DC metoprolol on discharge and would not restart  Hypertension Would hold her blood pressure medication at this time given low blood pressure Would follow-up as outpatient whether this needs to be restarted  Hip pain Resolved, has been ambulating even yesterday to the bathroom without any pain   [facility] will sign off.   Medication Recommendations: Hold beta-blocker and blood pressure pill Other recommendations (labs, testing, etc): Monitor blood pressure at home Follow up as an outpatient: With primary care   Total encounter time more than 25 minutes  Greater than 50% was spent in counseling and coordination of care with the  patient   For questions or updates, please contact [place] Please consult [email or web address] for contact info under        Signed, Ida Rogue, MD  09/19/2018, 5:51 PM

## 2018-09-19 NOTE — Progress Notes (Addendum)
Pt discharged to home in stable condition, all discharge instructions reviewed with and given to pt.  Pt transported via wheelchair with transporters x1 at chairside. Script not available on chart, pt needs Rx for prednisone to be called in to [facility] at [phone].  Please call pt at [phone] to notify script has been called in.

## 2018-09-19 NOTE — Care Management Note (Signed)
Case Management Note  Patient Details  Name: Kristy Solomon MRN: [id number] Date of Birth: April 29, 1929  Subjective/Objective:      Offered home health services again today as patient is discharging.  She states she would like to use [facility].  List of agengies with ratings given to patient.  Made referral to [facility] for SN, PT and aide.     He is aware of patient discharge today.             Action/Plan:   Expected Discharge Date:  09/19/18               Expected Discharge Plan:  [place]  In-House Referral:     Discharge planning Services  CM Consult  Post Acute Care Choice:  Home Health Choice offered to:  Patient  DME Arranged:    DME Agency:     HH Arranged:  RN, PT, Nurse's Aide [place] Agency:  [facility]  Status of Service:  Completed, signed off  If discussed at [place] of Stay Meetings, dates discussed:    Additional Comments:  Elza Rafter, RN 09/19/2018, 3:05 PM

## 2018-09-20 ENCOUNTER — Other Ambulatory Visit: Payer: Self-pay | Admitting: Urgent Care

## 2018-09-20 ENCOUNTER — Telehealth: Payer: Self-pay | Admitting: Pharmacist

## 2018-09-20 DIAGNOSIS — C8300 Small cell B-cell lymphoma, unspecified site: Secondary | ICD-10-CM

## 2018-09-20 NOTE — Telephone Encounter (Signed)
Oral Chemotherapy Pharmacist Encounter  Follow-Up Form  Following up regarding patient's oral chemotherapy medication: Imbruvica (ibrutinib)  Original Start date of oral chemotherapy: 04/2016  [facility] called patient for follow-up. Pt reported no missed doses.   Pt reports the following side effects: None reported  Recent labs reviewed: CBC from 09/17/18  DDI reviewed: no relevant DDI  Other Issues: None reported  Patient knows to call the office with questions or concerns. Oral Oncology Clinic will continue to follow.  Darl Pikes, PharmD, BCPS, [facility] Hematology/Oncology Clinical Pharmacist [facility] [place] [facility] [phone]  09/20/2018 10:33 AM

## 2018-09-20 NOTE — Progress Notes (Signed)
Spoke with on call MD Dr Posey Pronto, script and pharmacy info given, he will call in to [facility] as requested by pt. Also pt states the Dr Bridgett Larsson advised her to continue taking Hydralazine at home, discharge paperwork says discontinue this med. Pt's ride can't wait for this nurse to contact MD for clarification.  This nurse advised pt to follow printed discharge instructions and follow up with MD appt on 09/21/18 as scheduled and have issue addressed at that time.  Pt and family agrees with this plan. [id number]

## 2018-09-27 MED FILL — IMBRUVICA 420 MG TAB: 420 | 28 days supply | Qty: 28 | Fill #0

## 2018-09-30 ENCOUNTER — Emergency Department: Payer: Medicare HMO

## 2018-09-30 ENCOUNTER — Other Ambulatory Visit: Payer: Self-pay

## 2018-09-30 ENCOUNTER — Emergency Department
Admission: EM | Admit: 2018-09-30 | Discharge: 2018-10-01 | Disposition: A | Discharge status: Home / Self Care | Payer: Medicare HMO | Attending: Emergency Medicine | Admitting: Emergency Medicine

## 2018-09-30 ENCOUNTER — Encounter: Payer: Self-pay | Admitting: Emergency Medicine

## 2018-09-30 DIAGNOSIS — Z79899 Other long term (current) drug therapy: Secondary | ICD-10-CM | POA: Diagnosis not present

## 2018-09-30 DIAGNOSIS — I10 Essential (primary) hypertension: Secondary | ICD-10-CM

## 2018-09-30 DIAGNOSIS — Z8579 Personal history of other malignant neoplasms of lymphoid, hematopoietic and related tissues: Secondary | ICD-10-CM | POA: Diagnosis not present

## 2018-09-30 DIAGNOSIS — M25552 Pain in left hip: Secondary | ICD-10-CM | POA: Diagnosis present

## 2018-09-30 MED ORDER — ACETAMINOPHEN 500 MG PO TABS
1000.0000 mg | ORAL_TABLET | Freq: Once | ORAL | Status: AC
  Administered 2018-09-30: 1000 mg via ORAL

## 2018-09-30 MED ORDER — ACETAMINOPHEN 500 MG PO TABS
ORAL_TABLET | ORAL | Status: AC
  Administered 2018-09-30: 1000 mg via ORAL
  Filled 2018-09-30: qty 2

## 2018-09-30 NOTE — ED Notes (Signed)
Patient transported to CT 

## 2018-09-30 NOTE — ED Triage Notes (Signed)
Patient presents to Emergency Department via EMS with complaints of left hip pain.  Seen at treated here and at primary. Pt reports being unable to get arthritis strength tylenol ands regular tylenol doesn't help.  Pt was ambulatory without aid at home, per EMS slow but able to navigate stairs.

## 2018-09-30 NOTE — ED Notes (Signed)
Pain assessed, pt reports uses tylenol without relief  Pt reports some relief with prednisolone, willing to try extra-strength tylenol, or and anti-inflammatory, Dr Karma Greaser notified, orders recieved

## 2018-09-30 NOTE — ED Notes (Addendum)
Pt reports left hip pain "worse in the fold", CMS intact to both lower limbs

## 2018-09-30 NOTE — ED Provider Notes (Signed)
[facility] Emergency Department Provider Note  ____________________________________________   First MD Initiated Contact with Patient 09/30/18 2256     (approximate)  I have reviewed the triage vital signs and the nursing notes.   HISTORY  Chief Complaint Hip Pain    HPI Kristy Solomon is a 84 y.o. female with medical history as listed below who presents for evaluation of left hip pain.  This is her fourth ED visit and 6 months and at least the third for the pain in her left hip.  She reports that is been gradually worsening over about 3 weeks but has no history of fall or other traumatic injury.  She was admitted to the hospital on her last ED visit which was within the last week because of bradycardia, not because of the hip pain which was her initial chief complaint to the emergency department.  She did not receive an orthopedics consult at that time.  She says that she did follow-up with her primary care doctor who "had nothing to say" about the hip pain.  She has tried prednisone in the past and Tylenol but has not been able to get a prescription filled for Tylenol 3.  She reports that the hip was hurting all day today and that she has to walk very slowly as a result of the pain.  Nothing in particular makes it better.  She denies fever/chills, chest pain, shortness of breath, nausea, vomiting, and abdominal pain.  She says that she has taken her medicine today including her antihypertensives, which we asked because her blood pressure is significantly elevated at triage.  Past Medical History:  Diagnosis Date  . 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 Hyperlipidemia   . Hypertension   . 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 Renal insufficiency     Patient Active Problem List   Diagnosis Date Noted  . HLD (hyperlipidemia) 09/18/2018  . HTN (hypertension) 09/18/2018  . GERD (gastroesophageal reflux disease) 09/18/2018  . Back pain  09/18/2018  . Bradycardia 09/18/2018  . Bigeminy 09/18/2018  . Nephrolithiasis 01/11/2018  . Goals of care, counseling/discussion 12/28/2017  . Gastroenteritis 12/28/2017  . B12 deficiency 03/18/2016  . Folate deficiency 03/18/2016  . Iron deficiency anemia 03/18/2016  . Pneumonia 03/17/2016  . Pressure ulcer 03/17/2016  . Thrombocytopenia ([place]) 07/04/2014  . Acute renal failure ([place]) 07/04/2014  . Hyperuricemia 07/04/2014  . 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 Calculi, ureter 07/03/2012  . Frank hematuria 07/03/2012  . Hydronephrosis 07/03/2012  . 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 Calculus of kidney 07/02/2012  . Nonspecific finding on examination of urine 07/02/2012    Past Surgical History:  Procedure Laterality Date  . ABDOMINAL HYSTERECTOMY    . HEMORRHOID SURGERY      Prior to Admission medications   Medication Sig Start Date End Date Taking? Authorizing Provider  allopurinol (ZYLOPRIM) 100 MG tablet Take 2 tablets (200 mg total) by mouth daily. 09/11/18   Karen Kitchens, NP  atorvastatin (LIPITOR) 20 MG tablet Take 20 mg by mouth daily. 12/24/13   [provider]  Calcium Citrate-Vitamin D 315-250 MG-UNIT TABS Take 1 tablet by mouth daily.  06/27/12   [provider]  colchicine 0.6 MG tablet Take 0.6 mg by mouth as needed (gout).     [provider]  diclofenac sodium (VOLTAREN) 1 % GEL Apply 4 g topically 2 (two) times daily as needed. 09/15/18   Johnn Hai, PA-C  folic acid (FOLVITE) 1 MG tablet TAKE 1 TABLET BY MOUTH ONCE DAILY 07/03/18   Lequita Asal, MD  gabapentin (NEURONTIN) 300 MG capsule Take 300 mg by mouth 3 (three) times daily.     [provider]  hydrALAZINE (APRESOLINE) 25 MG tablet Take 1 tablet  (25 mg total) by mouth 3 (three) times daily. 10/01/18 10/01/19  Hinda Kehr, MD  IMBRUVICA 420 MG TABS TAKE 1 TABLET (420 MG) BY MOUTH DAILY. 09/20/18   Karen Kitchens, NP  predniSONE (DELTASONE) 10 MG tablet 20 mg po daily for 2 days, then, 10 mg po daily for 2 days. 09/19/18   Demetrios Loll, MD  traMADol Veatrice [place]) 50 MG tablet Take 1-2 tablets by mouth every 6 hours as needed for moderate to severe pain 10/01/18   Hinda Kehr, MD    Allergies Patient has no known allergies.  Family History  Problem Relation Age of Onset  . Cancer Brother        throat ca  . Cancer Brother        bone cancer    Social History Social History   Tobacco Use  . Smoking status: Never Smoker  . Smokeless tobacco: Never Used  Substance Use Topics  . Alcohol use: No  . Drug use: No    Review of Systems Constitutional: No fever/chills Eyes: No visual changes. ENT: No sore throat. Cardiovascular: Denies chest pain. Respiratory: Denies shortness of breath. Gastrointestinal: No abdominal pain.  No nausea, no vomiting.  No diarrhea.  No constipation. Genitourinary: Negative for dysuria. Musculoskeletal: Chronic persistent pain in the left hip made worse with weightbearing as described above.  No history of trauma. Integumentary: Negative for rash. Neurological: Negative for headaches, focal weakness or numbness.   ____________________________________________   PHYSICAL EXAM:  VITAL SIGNS: ED Triage Vitals  Enc Vitals Group     BP 09/30/18 2249 (!) 223/94     Pulse Rate 09/30/18 2249 68     Resp 09/30/18 2249 18     Temp 09/30/18 2249 98.3 F (36.8 C)     Temp Source 09/30/18 2249 Oral     SpO2 09/30/18 2249 98 %     Weight 09/30/18 2250 89.8 kg (198 lb)     Height 09/30/18 2250 1.6 m (5\' 3" )     Head Circumference --      Peak Flow --      Pain Score 09/30/18 2250 10     Pain Loc --      Pain Edu? --      Excl. in [place]? --     Constitutional: Alert and oriented. Well appearing and  in no acute distress. Eyes: Conjunctivae are normal.  Head: Atraumatic. Nose: No congestion/rhinnorhea. Mouth/Throat: Mucous membranes are moist. Neck: No stridor.  No meningeal signs.   Cardiovascular: Normal rate, regular rhythm. Good peripheral circulation. Grossly normal heart sounds. Respiratory: Normal respiratory effort.  No retractions. Lungs CTAB. Gastrointestinal: Soft and nontender. No distention.  Musculoskeletal: Pain with palpation of and around the left hip and the left proximal femur.  No skin changes, rash, erythema, etc.  She is able to bear weight but does so only reluctantly and with encouragement.  No gross deformity. Neurologic:  Normal speech and language. No gross focal neurologic deficits are appreciated.  Skin:  Skin is warm, dry and intact.  No rash noted. Psychiatric: Mood and affect are normal. Speech and behavior are normal.  ____________________________________________   LABS (all labs ordered are listed, but only abnormal results are displayed)  Labs Reviewed  CBC WITH DIFFERENTIAL/PLATELET - Abnormal; Notable for the following components:      Result Value   RBC 5.93 (*)    HCT 48.7 (*)    MCH 25.3 (*)    RDW 15.9 (*)    Platelets 92 (*)    All other components within normal limits  BASIC METABOLIC PANEL - Abnormal; Notable for the following components:   Glucose, Bld 154 (*)    Creatinine, Ser 1.31 (*)    Calcium 8.5 (*)    GFR calc non Af Amer 37 (*)    GFR calc Af Amer 42 (*)    All other components within normal limits   ____________________________________________  EKG  None - EKG not ordered by ED physician ____________________________________________  RADIOLOGY I, Hinda Kehr, personally viewed and evaluated these images (plain radiographs) as part of my medical decision making, as well as reviewing the written report by the radiologist.  ED MD interpretation: Unremarkable radiographs of the left hip with some mild degenerative  changes but no acute abnormalities.  No acute abnormalities identified on CT hip.  Official radiology report(s): Ct Hip Left Wo Contrast  Result Date: 10/01/2018 CLINICAL DATA:  Subacute onset of left hip pain. EXAM: CT OF THE LEFT HIP WITHOUT CONTRAST TECHNIQUE: Multidetector CT imaging of the left hip was performed according to the standard protocol. Multiplanar CT image reconstructions were also generated. COMPARISON:  Left hip radiographs performed earlier today at 10:59 p.m., and CT of the chest, abdomen and pelvis performed 07/11/2018 FINDINGS: Bones/Joint/Cartilage There is no evidence of fracture or dislocation. The left femoral neck appears intact. The left femoral head remains seated at the acetabulum. The visualized portions of the left sacroiliac joint are grossly unremarkable. The cartilage is not well assessed. No significant joint effusion is identified. Ligaments Suboptimally assessed by CT. Muscles and Tendons The visualized musculature is grossly unremarkable in appearance. No focal tendon abnormalities are identified. Soft tissues Scattered vascular calcifications are seen. The visualized portions of the pelvis are grossly unremarkable. IMPRESSION: 1. No evidence of fracture or dislocation. 2. Scattered vascular calcifications seen. Electronically Signed   By: Garald Balding M.D.   On: 10/01/2018 00:42   Dg Hip Unilat With Pelvis 2-3 Views Left  Result Date: 09/30/2018 CLINICAL DATA:  Acute onset of left groin pain. EXAM: DG HIP (WITH OR WITHOUT PELVIS) 2-3V LEFT COMPARISON:  Left hip radiographs performed 09/15/2018 FINDINGS: There is no evidence of fracture or dislocation. Both femoral heads are seated normally within their respective acetabula. The proximal left femur appears intact. Mild degenerative change is noted at the lower lumbar spine and left sacroiliac joint. The visualized bowel gas pattern is grossly unremarkable in appearance. Scattered vascular calcifications are seen.  IMPRESSION: No evidence of fracture or dislocation. Electronically Signed   By: Garald Balding M.D.   On: 09/30/2018 23:21    ____________________________________________   PROCEDURES  Critical Care performed: No   Procedure(s) performed:    Procedures   ____________________________________________   INITIAL IMPRESSION / ASSESSMENT AND PLAN / ED COURSE  As part of my medical decision making, I reviewed the following data within the [place] notes reviewed and incorporated, Labs reviewed , Old chart reviewed, Radiograph reviewed , Notes from prior ED visits and [place] Controlled Substance Database  Differential diagnosis includes, but is not limited to, musculoskeletal strain, osteoarthritis, fracture/dislocation, less likely infection or internal hematoma.  The patient's symptoms sound consistent and chronic and she was bearing weight for the paramedics even though she is reluctant to do so.  She is substantially hypertensive tonight and there is a report that she may have stopped her medications recently, I will investigate through chart review.  I am checking basic lab work to make sure there is no evidence of acute kidney injury as result of the hypertension but I think it unlikely.  She is here only for her hip pain.  X-rays are pending and I will plan to CT of the hip if the x-rays are nondiagnostic, but I have already explained to her that most likely we will not be able to diagnose a specific cause of the hip pain nor make it go away tonight and that she would likely require outpatient follow-up.  She does not seem to understand why this would be the case and repeatedly told me she wants to know why her hip is hurting and I assured her I will do my best to look for any acute causes.  Clinical Course as of Oct 02 711  Mon Oct 01, 2018  0003 Improved creatinine compared to the last one from several days ago which was about 1.7  Creatinine(!): 1.31 [CF]    0014 Reassuring CBC  CBC with Differential/Platelet(!) [CF]  5009 CT Hip Left Wo Contrast [CF]  3818 The patient remains hypertensive but asymptomatic from the hypertension.  As documented above, her creatinine is better than it has been in the past.I reviewed the medical record extensively including the last admission which was about 12 days ago.  At that time she was both mildly hypotensive and bradycardic so all of her antihypertensives were discontinued.  This seems to include primarily metoprolol and hydralazine, apparently both of which she had been on for years.I believe that we are seeing the effects of her being off of all of her antihypertensives.  Given that her heart rate is in the 60s I will avoid beta-blockers and calcium channel blockers, but I do believe that she needs to be on an antihypertensive.  I will start her back on hydralazine because she apparently tolerated that in the past and the main reason for her prior admission was the bradycardia.  I will give her a dose of hydralazine tonight and write her a new prescription at her prior dose.  She will need to follow-up with her primary care doctor.   [CF]  2993 Of note, I verified in the computer on the prior admission that she was taking hydralazine 25 mg 3 times a day, so that is what I will start her back on.   [CF]  [id number] The patient has been resting comfortably overnight.  I will discharge her today for close outpatient follow-up.  I went over the results with her and she still does not quite understand why I cannot tell her what is causing the long-term hip pain but she seems comfortable with the plan for follow-up.   [CF]    Clinical Course User Index [CF] Hinda Kehr, MD    ____________________________________________  FINAL CLINICAL IMPRESSION(S) / ED DIAGNOSES  Final diagnoses:  Left hip pain  Essential hypertension     MEDICATIONS GIVEN DURING THIS VISIT:  Medications  acetaminophen (TYLENOL) tablet 1,000  mg (1,000 mg Oral Given 09/30/18 2324)  hydrALAZINE (APRESOLINE) tablet 25 mg (25 mg Oral Given  10/01/18 0157)  traMADol (ULTRAM) tablet 100 mg (100 mg Oral Given 10/01/18 0200)     ED Discharge Orders         Ordered    hydrALAZINE (APRESOLINE) 25 MG tablet  3 times daily     10/01/18 0057    traMADol (ULTRAM) 50 MG tablet     10/01/18 0057           Note:  This document was prepared using Dragon voice recognition software and may include unintentional dictation errors.    Hinda Kehr, MD 10/01/18 [phone]

## 2018-10-01 DIAGNOSIS — M25552 Pain in left hip: Secondary | ICD-10-CM | POA: Diagnosis not present

## 2018-10-01 LAB — BASIC METABOLIC PANEL
Anion gap: 9 (ref 5–15)
BUN: 21 mg/dL (ref 8–23)
CO2: 23 mmol/L (ref 22–32)
Calcium: 8.5 mg/dL — ABNORMAL LOW (ref 8.9–10.3)
Chloride: 103 mmol/L (ref 98–111)
Creatinine, Ser: 1.31 mg/dL — ABNORMAL HIGH (ref 0.44–1.00)
GFR calc Af Amer: 42 mL/min — ABNORMAL LOW (ref >60)
GFR, EST NON AFRICAN AMERICAN: 37 mL/min — AB (ref >60)
Glucose, Bld: 154 mg/dL — ABNORMAL HIGH (ref 70–99)
Potassium: 3.9 mmol/L (ref 3.5–5.1)
Sodium: 135 mmol/L (ref 135–145)

## 2018-10-01 LAB — CBC WITH DIFFERENTIAL/PLATELET
Abs Immature Granulocytes: 0.05 10*3/uL (ref 0.00–0.07)
Basophils Absolute: 0 10*3/uL (ref 0.0–0.1)
Basophils Relative: 1 %
EOS PCT: 0 %
Eosinophils Absolute: 0 10*3/uL (ref 0.0–0.5)
HCT: 48.7 % — ABNORMAL HIGH (ref 36.0–46.0)
Hemoglobin: 15 g/dL (ref 12.0–15.0)
Immature Granulocytes: 1 %
Lymphocytes Relative: 17 %
Lymphs Abs: 0.9 10*3/uL (ref 0.7–4.0)
MCH: 25.3 pg — AB (ref 26.0–34.0)
MCHC: 30.8 g/dL (ref 30.0–36.0)
MCV: 82.1 fL (ref 80.0–100.0)
Monocytes Absolute: 0.5 10*3/uL (ref 0.1–1.0)
Monocytes Relative: 9 %
Neutro Abs: 3.9 10*3/uL (ref 1.7–7.7)
Neutrophils Relative %: 72 %
Platelets: 92 10*3/uL — ABNORMAL LOW (ref 150–400)
RBC: 5.93 MIL/uL — ABNORMAL HIGH (ref 3.87–5.11)
RDW: 15.9 % — ABNORMAL HIGH (ref 11.5–15.5)
WBC: 5.4 10*3/uL (ref 4.0–10.5)
nRBC: 0 % (ref 0.0–0.2)

## 2018-10-01 MED ORDER — TRAMADOL HCL 50 MG PO TABS
100.0000 mg | ORAL_TABLET | Freq: Once | ORAL | Status: AC
  Administered 2018-10-01: 100 mg via ORAL
  Filled 2018-10-01: qty 2

## 2018-10-01 MED ORDER — HYDRALAZINE HCL 50 MG PO TABS
25.0000 mg | ORAL_TABLET | ORAL | Status: AC
  Administered 2018-10-01: 25 mg via ORAL
  Filled 2018-10-01: qty 1

## 2018-10-01 MED ORDER — TRAMADOL HCL 50 MG PO TABS: ORAL_TABLET | ORAL | 0 refills | Status: DC

## 2018-10-01 MED ORDER — HYDRALAZINE HCL 25 MG PO TABS: 25.0000 mg | ORAL_TABLET | Freq: Three times a day (TID) | ORAL | 2 refills | Status: DC

## 2018-10-01 NOTE — ED Notes (Signed)
Pt requesting "a snack", pt oriented to NPO status until medical clearance

## 2018-10-01 NOTE — ED Notes (Signed)
Patient transferred to restroom with assistance.

## 2018-10-01 NOTE — ED Notes (Signed)
fam member is coming to pick up patient.

## 2018-10-01 NOTE — Discharge Instructions (Signed)
As we discussed, your work-up was reassuring today.  Given the ongoing pain in your hip, I obtained a CT scan to make sure there was nothing subtle in the bones or the tissues that was causing your pain that was missed by the x-rays you have had recently.  Fortunately the CT scan looked essentially normal except for some age-related arthritis.  There is nothing we can give you that we will stop the pain.  We recommend that you take 2 extra strength Tylenol tablets (1000 mg) no more often than every 6 hours while you are awake.  Additionally, I prescribed you some tramadol, which is a stronger pain medication that you can take particularly in the evening which should help relieve some of the discomfort.  Again, though, it will not make the pain go away.  We recommend that you follow-up with either Dr. Sharlet Salina or with Dr. Sabra Heck (or one of his orthopedics colleagues).  Dr. Jefm Bryant, whom you have seen in the past for thigh pain, may also be able to help you.  Dr. Sharlet Salina is a physiatrist who may be able to help you with the ongoing chronic pain in your hip.  Alternatively the orthopedic specialist may have some other treatments that can help.  We do not recommend you continue to take prednisone or nonsteroidal anti-inflammatory medication such as Aleve or ibuprofen given your age and that your kidneys do not work quite as well as they did when you were younger.  Please follow-up with these specialists, particularly Dr. Sharlet Salina, and with your primary care doctor to discuss additional treatment options.  Also, please note that I have started you back on a blood pressure pill.  All of your blood pressure medicine was discontinued 2 weeks ago when you were in the hospital, but being off of all the medication has resulted in your blood pressure becoming too high.  Even though your blood work is reassuring today, you do need to be on at least one medication.  I have restarted you on the hydralazine and encourage you  to fill the prescription.  Please take this paperwork to your primary care doctor and discuss with him whether or not you need to continue on this medication or whether you may need a different treatment plan.

## 2018-10-04 ENCOUNTER — Telehealth: Payer: Self-pay

## 2018-10-04 ENCOUNTER — Telehealth: Payer: Self-pay | Admitting: *Deleted

## 2018-10-04 NOTE — Telephone Encounter (Signed)
Received call from [facility] @ Dr. Lonia Blood office today 10-04-18 regarding referral.  Patient is scheduled to see Dr. Lonia Blood on January 17th @ 51 am.  Marliss Coots will notify patient.

## 2018-10-04 NOTE — Telephone Encounter (Signed)
-----   Message from Britt Bottom, [place] sent at 10/04/2018  8:40 AM EST ----- Good morning, I spoke with pt this am she mentioned that she won't be able to make it in to see [place] tomorrow. Pt is having hip mobility issues and wants to reschedule.  Attempted to transfer call for schedule no answer/ Pt aware that she will be contacted to change appointment.  Please contact pt to reschedule.  Thank you, Lenda Kelp

## 2018-10-04 NOTE — Telephone Encounter (Signed)
Spoke with patient Does not wish to r/s at this time Will call back when ready

## 2018-10-04 NOTE — Progress Notes (Deleted)
   Cardiology Office Note Date:  10/04/2018  Patient ID:  Kristy Solomon, Kristy Solomon 1929-07-13, MRN [id number] PCP:  Jodi Marble, MD  Cardiologist:  Dr. Saunders Revel, MD  ***refresh   Chief Complaint: ***  History of Present Illness: Kristy Solomon is a 85 y.o. female with history of ***   Past Medical History:  Diagnosis Date  . 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 Hyperlipidemia   . Hypertension   . 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 Renal insufficiency     Past Surgical History:  Procedure Laterality Date  . ABDOMINAL HYSTERECTOMY    . HEMORRHOID SURGERY      No outpatient medications have been marked as taking for the 10/05/18 encounter (Appointment) with Rise Mu, PA-C.    Allergies:   Patient has no known allergies.   Social History:  The patient  reports that she has never smoked. She has never used smokeless tobacco. She reports that she does not drink alcohol or use drugs.   Family History:  The patient's family history includes Cancer in her brother and brother.  ROS:   ROS   PHYSICAL EXAM: *** VS:  There were no vitals taken for this visit. BMI: There is no height or weight on file to calculate BMI.  Physical Exam   EKG:  Was ordered and interpreted by me today. Shows ***  Recent Labs: 09/11/2018: ALT 14 09/18/2018: Magnesium 1.9; TSH 2.132 09/30/2018: BUN 21; Creatinine, Ser 1.31; Hemoglobin 15.0; Platelets 92; Potassium 3.9; Sodium 135  No results found for requested labs within last 8760 hours.   Estimated Creatinine Clearance: 32.2 mL/min (A) (by C-G formula based on SCr of 1.31 mg/dL (H)).   Wt Readings from Last 3 Encounters:  09/30/18 198 lb (89.8 kg)  09/18/18 201 lb 4.8 oz (91.3 kg)  09/15/18 203 lb (92.1 kg)     Other studies reviewed: Additional studies/records reviewed today include: summarized above  ASSESSMENT AND PLAN:  1. ***  Disposition: F/u with Dr. Saunders Revel or an APP in ***  Current medicines are  reviewed at length with the patient today.  The patient did not have any concerns regarding medicines.  Signed, Christell Faith, PA-C 10/04/2018 7:47 AM     [facility] [address] [place] [address] [place], [place] [address] [phone]

## 2018-10-05 ENCOUNTER — Ambulatory Visit: Payer: Medicare HMO | Admitting: Physician Assistant

## 2018-10-05 NOTE — Telephone Encounter (Signed)
Dr. Lonia Blood office was going to contact the patient.  I am sure Kristy Solomon will discuss this issue with them.  [facility] will not provide transportation outside of [facility].

## 2018-10-05 NOTE — Telephone Encounter (Signed)
  Please ensure patient has a ride.  She said she could only go if a family member took her.  If she can't go on that date, ask Dr Lonia Blood' office to coordinate a day and a time for her.  M

## 2018-10-08 ENCOUNTER — Other Ambulatory Visit: Payer: Self-pay

## 2018-10-08 DIAGNOSIS — C8300 Small cell B-cell lymphoma, unspecified site: Secondary | ICD-10-CM

## 2018-10-09 ENCOUNTER — Inpatient Hospital Stay: Payer: Medicare Other | Admitting: Hematology and Oncology

## 2018-10-09 ENCOUNTER — Inpatient Hospital Stay: Payer: Medicare Other

## 2018-10-09 ENCOUNTER — Inpatient Hospital Stay: Payer: Medicare Other | Attending: Hematology and Oncology

## 2018-10-09 NOTE — Progress Notes (Deleted)
[facility] day:  10/09/18  Chief Complaint: Kristy Solomon is a 85 y.o. female with lymphoplasmacytic lymphoma who is seen for a 1 month assessment on Ibrutinib.  HPI: The patient was last seen in the medical oncology clinic on 09/11/2018.  At that time,  she denied any B symptoms.  She felt "fine".  Exam was stable. We reviewed imaging studies which revealed a mixed response.  We discussed Rituxan, Velcade, and Decadron.  Hepatitis B and C testing was negative.  We discussed second opinion with Dr. Lonia Blood at [facility].  Patient was in agreement.  During the interim,    Past Medical History:  Diagnosis Date  . 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 Hyperlipidemia   . Hypertension   . Malignant lymphoma, lymphoplasmacytic ([place]) 12/27/2013  . Renal insufficiency     Past Surgical History:  Procedure Laterality Date  . ABDOMINAL HYSTERECTOMY    . HEMORRHOID SURGERY      Family History  Problem Relation Age of Onset  . Cancer Brother        throat ca  . Cancer Brother        bone cancer    Social History:  reports that she has never smoked. She has never used smokeless tobacco. She reports that she does not drink alcohol or use drugs.  She lives in [place] with her grandson and his wife. Her emergency contact is Cecille Po, her grandaughter- [phone]).  [name]'s phone number [phone]).  She lives in [place].  She has transportation issues.  She states that her grandson's wife can assist with travel (needs to be arranged).  She is alone today.  Allergies: No Known Allergies  Current Medications: Current Outpatient Medications  Medication Sig Dispense Refill  . allopurinol (ZYLOPRIM) 100 MG tablet Take 2 tablets (200 mg total) by mouth daily. 180 tablet 1  . atorvastatin (LIPITOR) 20 MG tablet Take 20 mg by mouth daily.    . Calcium Citrate-Vitamin D 315-250 MG-UNIT TABS Take 1 tablet by mouth daily.     . colchicine  0.6 MG tablet Take 0.6 mg by mouth as needed (gout).   0  . diclofenac sodium (VOLTAREN) 1 % GEL Apply 4 g topically 2 (two) times daily as needed. 1 Tube 0  . folic acid (FOLVITE) 1 MG tablet TAKE 1 TABLET BY MOUTH ONCE DAILY 90 tablet 0  . gabapentin (NEURONTIN) 300 MG capsule Take 300 mg by mouth 3 (three) times daily.     . hydrALAZINE (APRESOLINE) 25 MG tablet Take 1 tablet (25 mg total) by mouth 3 (three) times daily. 90 tablet 2  . IMBRUVICA 420 MG TABS TAKE 1 TABLET (420 MG) BY MOUTH DAILY. 28 tablet 2  . predniSONE (DELTASONE) 10 MG tablet 20 mg po daily for 2 days, then, 10 mg po daily for 2 days. 6 tablet 0  . traMADol (ULTRAM) 50 MG tablet Take 1-2 tablets by mouth every 6 hours as needed for moderate to severe pain 20 tablet 0   No current facility-administered medications for this visit.    Facility-Administered Medications Ordered in Other Visits  Medication Dose Route Frequency Provider Last Rate Last Dose  . cyanocobalamin ((VITAMIN B-12)) injection 1,000 mcg  1,000 mcg Intramuscular Once Lequita Asal, MD        Review of Systems  Constitutional: Negative.  Negative for chills, diaphoresis, fever, malaise/fatigue and weight loss (stable).       Feels "fine".  HENT: Negative.  Negative for congestion, ear discharge, ear pain, nosebleeds, sinus pain, sore throat and tinnitus.   Eyes: Negative.  Negative for double vision, photophobia, pain, discharge and redness.  Respiratory: Negative.  Negative for cough, hemoptysis and sputum production.   Cardiovascular: Negative.  Negative for chest pain, palpitations, orthopnea and leg swelling.  Gastrointestinal: Negative.  Negative for abdominal pain, blood in stool, constipation, diarrhea, melena, nausea and vomiting.  Genitourinary: Negative for dysuria, frequency, hematuria and urgency.       Known nephrolithiasis- asymptomatic. (+) LEFT renal mass.   Musculoskeletal: Positive for joint pain (left hip pain- new today).  Negative for back pain, falls and neck pain.       PMH (+) for gout; denies recent flares  Skin: Negative.  Negative for itching and rash.  Neurological: Negative.  Negative for dizziness, tingling, tremors, sensory change, speech change, focal weakness, weakness and headaches.  Endo/Heme/Allergies: Negative.  Does not bruise/bleed easily.  Psychiatric/Behavioral: Negative for depression and memory loss. The patient is not nervous/anxious and does not have insomnia.   All other systems reviewed and are negative.  Performance status (ECOG):1  Vital signs There were no vitals taken for this visit.  Physical Exam  Constitutional: She is oriented to person, place, and time and well-developed, well-nourished, and in no distress. No distress.  HENT:  Head: Normocephalic and atraumatic.  Mouth/Throat: Oropharynx is clear and moist and mucous membranes are normal. No oropharyngeal exudate.  Wearing a cap.  Gray hair.  Eyes: Pupils are equal, round, and reactive to light. Conjunctivae and EOM are normal. No scleral icterus.  Glasses. Brown eyes.   Neck: Normal range of motion. Neck supple. No JVD present.  Cardiovascular: Regular rhythm, normal heart sounds and intact distal pulses. Bradycardia present. Exam reveals no gallop.  No murmur heard. Pulmonary/Chest: Breath sounds normal. No respiratory distress. She has no wheezes. She has no rales.  Abdominal: Soft. Bowel sounds are normal. She exhibits no distension and no mass. There is no abdominal tenderness. There is no rebound and no guarding.  Musculoskeletal: Normal range of motion.        General: No tenderness or edema.  Lymphadenopathy:    She has no cervical adenopathy.  Neurological: She is alert and oriented to person, place, and time. Gait normal.  Skin: Skin is warm and dry. No rash noted. She is not diaphoretic. No erythema. No pallor.  No palpable SQ masses.  Psychiatric: Mood, affect and judgment normal.  Nursing note and  vitals reviewed.   No visits with results within 3 Day(s) from this visit.  Latest known visit with results is:  Admission on 09/30/2018, Discharged on 10/01/2018  Component Date Value Ref Range Status  . WBC 09/30/2018 5.4  4.0 - 10.5 K/uL Final  . RBC 09/30/2018 5.93* 3.87 - 5.11 MIL/uL Final  . Hemoglobin 09/30/2018 15.0  12.0 - 15.0 g/dL Final  . HCT 09/30/2018 48.7* 36.0 - 46.0 % Final  . MCV 09/30/2018 82.1  80.0 - 100.0 fL Final  . MCH 09/30/2018 25.3* 26.0 - 34.0 pg Final  . MCHC 09/30/2018 30.8  30.0 - 36.0 g/dL Final  . RDW 09/30/2018 15.9* 11.5 - 15.5 % Final  . Platelets 09/30/2018 92* 150 - 400 K/uL Final   Comment: Immature Platelet Fraction may be clinically indicated, consider ordering this additional test [id number]   . nRBC 09/30/2018 0.0  0.0 - 0.2 % Final  . Neutrophils Relative % 09/30/2018 72  % Final  . Neutro  Abs 09/30/2018 3.9  1.7 - 7.7 K/uL Final  . Lymphocytes Relative 09/30/2018 17  % Final  . Lymphs Abs 09/30/2018 0.9  0.7 - 4.0 K/uL Final  . Monocytes Relative 09/30/2018 9  % Final  . Monocytes Absolute 09/30/2018 0.5  0.1 - 1.0 K/uL Final  . Eosinophils Relative 09/30/2018 0  % Final  . Eosinophils Absolute 09/30/2018 0.0  0.0 - 0.5 K/uL Final  . Basophils Relative 09/30/2018 1  % Final  . Basophils Absolute 09/30/2018 0.0  0.0 - 0.1 K/uL Final  . Immature Granulocytes 09/30/2018 1  % Final  . Abs Immature Granulocytes 09/30/2018 0.05  0.00 - 0.07 K/uL Final   Performed at [facility], [address] [place], [place] [address]  . Sodium 09/30/2018 135  135 - 145 mmol/L Final  . Potassium 09/30/2018 3.9  3.5 - 5.1 mmol/L Final  . Chloride 09/30/2018 103  98 - 111 mmol/L Final  . CO2 09/30/2018 23  22 - 32 mmol/L Final  . Glucose, Bld 09/30/2018 154* 70 - 99 mg/dL Final  . BUN 09/30/2018 21  8 - 23 mg/dL Final  . Creatinine, Ser 09/30/2018 1.31* 0.44 - 1.00 mg/dL Final  . Calcium 09/30/2018 8.5* 8.9 - 10.3 mg/dL Final  . GFR calc non  Af Amer 09/30/2018 37* >60 mL/min Final  . GFR calc Af Amer 09/30/2018 42* >60 mL/min Final  . Anion gap 09/30/2018 9  5 - 15 Final   Performed at [facility], [address] [place], [place] [address]    Assessment:  Kristy Solomon is a 85 y.o. 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 She presented with massive subcutaneous deposits, pulmonary nodules, involvement of left kidney, and bowel. Disease was unresponsive to Rituxan in 2014.   Chest, abdomen, and pelvic CT scan on 03/17/2016 revealed progression of diffuse lymphoma with subcutaneous deposits throughout the subcutaneous fat of the chest, abdomen,and pelvis. There were multiple nodular infiltrates throughout the lungs, likely due to lymphoma but could also represent infection or atypical infection. There was massive retroperitoneal lymphadenopathy with diffuse retroperitoneal, mesenteric, and pelvic lymphadenopathy. There was wall thickening and pelvic small bowel probably representing lymphomas involvement. There was probable direct invasion of the left kidney.  Biopsy of the dominant SQ nodal conglomeration around the right flank on 03/18/2016 revealed a persistent low-grade CD5 positive B-cell lymphoma.  Flow cytometry was positive for CD20, CD79a, CD5, CD138. Neoplastic cells were positive for kappa and cyclin D1.  CD 23 was negative.   Additional molecular markers are pending.  Bone marrow aspirate and biopsy on 03/18/2016 revealed no evidence of lymphoma or plasma cell neoplasm (0.2% kappa monoclonal plasma cells by flow analysis).  Marrow was hypercellular for age (50-60%) with trilineage hematopoiesis and no increased blasts. There was mild to moderate widespread increase in reticulin fibers. There was no storage iron detected.  The following studies were abnormal:  Beta2-microglobulin was 9.6 (0.6-2.4).  Serum viscosity was 5.5 (1.6-1.9).  Uric acid was 11.1 on 03/19/2016  and 5.7 on 05/23/2016.     Normal studies included:  hepatitis B surface antigen, hepatitis B core antibody total, hepatitis C antibody, HIV testing, and LDH (123).  G6PD assay was 9.2 (4.6-13.5).   She has iron deficiency (ferritin 27; iron saturation 11%), B12 deficiency (147), and folate deficiency (5.9).  She has chronic renal insufficiency (BUN was 30-35, creatinine was 1.38 - 1.80 with a GFR 29-39 ml/min).  She began B12 injections on 03/28/2016 (last 09/03/2018).  She is on folic acid.  Folate was 77.5 on 05/09/2016, 42 on 03/13/2017, and 11.4 on 01/08/2018.   She received 1 week of chlorambucil with a tapering dose of prednisone (began 03/30/2016).  Cortisol was 9.5 on 05/17/2016.  She began ibrutinib on 04/26/2016.  She has mild thrombocytopenia (94,000).   SPEP has been followed:  5.0 gm/dL on 03/18/2016, 4.5 on 04/25/2016,  2.3 on 06/20/2016, 2.0 on 10/24/2016, 1.5 on 12/19/2016, 1.4 on 03/13/2017, 1.1 on 07/01/2017, 1.3 on 01/08/2018, 0.9 on 04/12/2018, and 1.0 on 07/06/2018. .  IgM has been followed:  > 5850 on 03/18/2016, > 5850 on 04/25/2016, 4652 on 06/09/2016, 3719 on 07/18/2016, 3429 on 10/24/2016, 2821 on 12/19/2016, 2295 on 03/13/2017, 2500 on 06/30/2017, 1890 09/29/2017, 1769 on 01/08/2018, 1244 on 04/12/2018, 1509 on 07/06/2018, and 1567 on 09/11/2018.  Serum viscosity has been followed: 5.5 on 03/18/2016 and 1.6 on 09/11/2018.  Chest, abdomen, and pelvic CT on 04/21/2016 revealed a partial response to therapy.  There were bilateral pulmonary nodules/masses, measuring up to 5.6 cm in the right lower lobe (improved).  There was trace left pleural effusion.  There was a 12.7 cm left renal/perirenal mass (decreased) with associated mild left hydronephrosis.  Retroperitoneal/left pelvic lymphadenopathy was mildly decreased.  Multifocal subcutaneous nodules, measuring up to 4.5 cm in the left lower anterior abdominal wall were stable to mildly improved.  Abdomen and pelvic CT on  12/28/2017 was concerning for small bowel obstruction with transitional zone at the distal ileum.  There was no associated mass.  LEFT renal mass measured 6.1 x 5.0 cm (previously 13.7 x 10.7 cm).  Suspect combination of mass and adenopathy due to lymphoma in the left adrenal. This area was much less pronounced than on prior study. There was a 1.7 x 1.2 cm lymph node in the RIGHT external iliac node chain.  Subcutaneous deposits bilaterally were considerably smaller compared to 2017.  There were sizable calculi (2.0 x 1.7 cm, 2.6 x 2.0 cm) in each kidney without obstruction.  Chest, abdomen, and pelvic CT on 07/11/2018 revealed a hypodense 3.6 cm inferior LEFT thyroid nodule (stable), new subpleural medial 0.8 cm apical RUL nodule, new subpleural anterior 0.5 cm LUL nodule, basilar LLL mass measuring 4.6 x 3.3 cm (previously 4.2 x 3.2 cm) and a basilar RLL nodule measuring 1.4 cm (previously 0.6 cm). The LEFT upper breast mass measuring 2.4 x 2.0 cm (previously 3.8 x 2.1 cm). There were superficial RIGHT chest wall masses measuring up to 2.3 x 0.9 cm (previously 4.1 x 1.6 cm). There were several bulky stones in the RIGHT renal pelvis measuring up to 22 x 11 mm, and a non-obstructing 13 mm LEFT renal stone. There was a coarsely calcified 2.3 cm anterior interpolar RIGHT renal cortical lesion, and a simple 1.8 cm anterior RIGHT renal cyst. The infiltrative soft tissue density, replacing the LEFT renal sinus and partially encasing the kidney/vascular pedicle, spanned up to 10.2 x 6.5 cm.   Symptomatically,  she denies any B symptoms.  She feels "fine".  Exam is stable.   Plan: 1. Labs today:  CBC with diff, CMP, uric acid. 2.  3. CBC with diff, CMP, IgM, uric acid, serum viscosity, hepatitis B and C serologies. 4. Waldenstrm's macroglobulinemia:  Discuss prior converation regarding need for change in treatment given mixed response.  Patient states that she feels fine.  Denies need to change  therapy.  Review images from chest, abdomen, and pelvic CT scan with patient.  Discuss patients' prior conversation regarding no  IV treatment.  Discuss consideration bendamustine and Rituxan (BR) at reduced dose secondary to age. Potential side effects reviewed. Patient declines.  Review regimen involving Rituxan, Velcade and Decadron.  Discuss SQ Velcade weekly.  Potential side effects of tretment reviewed.  Discuss obtaining hepatitis serologies (hepatitis B surface antigen, hepatitis B core antibody) in anticipation of Rituxan.  Discuss obtaining second opinion with Dr. Lonia Blood at [facility].  Patient in agreement.  She will need to work out transportation issues.  5. LEFT renal mass Etiology felt secondary to lymphoplasmacytic lymphoma/Waldenstrom's secondary to prior response to therapy. 6. Left hip pain:  No trauma.  Discuss imaging (plain films).  Patient would like to defer evaluation. 5.  Hypertension:  Discuss elevated blood pressure on clinic visits.  Unclear if related to renal mass, essential hypertension or white coat hypertension.   Repeat BP today. 6.  Consult Dr. Lonia Blood at [facility] re: second opinion regarding treatment options.  Patient to call with available times for she has for transportation (coordinate with [facility]). 7.  RTC in 1 month for MD assessment, labs (CBC with diff, CMP, uric acid).  A total of (> 20) minutes of face-to-face time was spent with the patient with greater than 50% of that time in counseling and care-coordination.    Lequita Asal, MD  10/09/18, 4:56 AM

## 2018-10-26 ENCOUNTER — Telehealth: Payer: Self-pay

## 2018-10-26 MED FILL — IMBRUVICA 420 MG TAB: 420 | 28 days supply | Qty: 28 | Fill #1

## 2018-10-26 NOTE — Telephone Encounter (Signed)
Talked to patient regarding missed appt. With [facility]. Pt reports she was sick at the time and would like to reschedule. Informed patient I would contact facility to reschedule.

## 2018-10-26 NOTE — Telephone Encounter (Signed)
Left VM with referral line to reschedule appt. Pt number and facility number provided.

## 2018-11-01 ENCOUNTER — Telehealth: Payer: Self-pay

## 2018-11-01 NOTE — Telephone Encounter (Signed)
Spoke with patient on 10/31/2018 to confirm if she has heard back from [facility]. Pt denies. Contacted [facility] and left another message to attempt to reschedule new patient appt. With Dr. Lonia Blood.

## 2018-11-05 ENCOUNTER — Telehealth: Payer: Self-pay

## 2018-11-05 NOTE — Telephone Encounter (Signed)
Message received from Dan Europe stating Marliss Coots from [facility] called regarding appt. For patient. Reports she spoke with patient on Friday and states patient would prefer not to schedule at this time until she sees Dr. Mike Gip again.

## 2018-11-06 ENCOUNTER — Inpatient Hospital Stay: Payer: Medicare Other | Attending: Hematology and Oncology

## 2018-11-21 MED FILL — IMBRUVICA 420 MG TAB: 420 | 28 days supply | Qty: 28 | Fill #2

## 2018-12-03 ENCOUNTER — Telehealth: Payer: Self-pay

## 2018-12-03 NOTE — Telephone Encounter (Signed)
Received call from [facility] - Dr. Lonia Blood' Office from [place] regarding appointment for patient scheduled for 01/02/2019 at 1230. Reports she can be reached directly at [phone].   Contacted patient to inform of appointment date and time. Informed patient of importance of attending appt. Or rescheduling as this is her third time and she will be blocked from their schedule if she misses appt. Number provided to patient if rescheduling is needed. Pt verbalizes understanding and denies any questions at this time.

## 2018-12-13 ENCOUNTER — Other Ambulatory Visit: Payer: Self-pay | Admitting: Urgent Care

## 2018-12-13 DIAGNOSIS — C8300 Small cell B-cell lymphoma, unspecified site: Secondary | ICD-10-CM

## 2018-12-18 ENCOUNTER — Inpatient Hospital Stay: Payer: Medicare Other | Attending: Hematology and Oncology

## 2018-12-21 MED FILL — IMBRUVICA 420 MG TAB: 420 | 28 days supply | Qty: 28 | Fill #0

## 2019-01-01 ENCOUNTER — Telehealth: Payer: Self-pay

## 2019-01-01 NOTE — Telephone Encounter (Signed)
-----   Message from Lequita Asal, MD sent at 12/31/2018  4:47 PM EDT ----- Regarding: Please call patient  She did not see Dr Lonia Blood at [facility].  She needs to reschedule if possible or see [place].  M

## 2019-01-01 NOTE — Telephone Encounter (Addendum)
Spoke with Kristy Solomon to see if she been to see Kristy Solomon at the [facility] office. She confirms that Kristy Solomon will be out to do a home visit today. Kristy Solomon reports his called her yesterday to confirm her appointment time and date. Kristy Solomon was understanding and agreeable to allow his office to come out today. I will follow up with this patient later in the week.

## 2019-01-14 ENCOUNTER — Other Ambulatory Visit: Payer: Self-pay

## 2019-01-14 MED FILL — IMBRUVICA 420 MG TAB: 420 | 28 days supply | Qty: 28 | Fill #1

## 2019-01-15 ENCOUNTER — Other Ambulatory Visit: Payer: Medicare Other

## 2019-01-15 ENCOUNTER — Inpatient Hospital Stay: Payer: Medicare Other

## 2019-01-15 ENCOUNTER — Ambulatory Visit: Payer: Medicare Other | Admitting: Hematology and Oncology

## 2019-01-15 ENCOUNTER — Other Ambulatory Visit: Payer: Self-pay

## 2019-01-16 ENCOUNTER — Other Ambulatory Visit: Payer: Self-pay | Admitting: Hematology and Oncology

## 2019-01-16 ENCOUNTER — Encounter: Payer: Self-pay | Admitting: Hematology and Oncology

## 2019-01-16 ENCOUNTER — Inpatient Hospital Stay: Payer: Medicare Other

## 2019-01-16 ENCOUNTER — Inpatient Hospital Stay: Payer: Medicare Other | Attending: Hematology and Oncology | Admitting: Hematology and Oncology

## 2019-01-16 VITALS — BP 124/69 | HR 44 | Temp 97.7°F | Resp 18 | Ht 63.0 in | Wt 201.3 lb

## 2019-01-16 DIAGNOSIS — R911 Solitary pulmonary nodule: Secondary | ICD-10-CM

## 2019-01-16 DIAGNOSIS — C880 Waldenstrom macroglobulinemia: Secondary | ICD-10-CM | POA: Diagnosis present

## 2019-01-16 DIAGNOSIS — R001 Bradycardia, unspecified: Secondary | ICD-10-CM | POA: Diagnosis not present

## 2019-01-16 DIAGNOSIS — C8300 Small cell B-cell lymphoma, unspecified site: Secondary | ICD-10-CM

## 2019-01-16 DIAGNOSIS — E538 Deficiency of other specified B group vitamins: Secondary | ICD-10-CM

## 2019-01-16 DIAGNOSIS — Z7189 Other specified counseling: Secondary | ICD-10-CM

## 2019-01-16 DIAGNOSIS — N289 Disorder of kidney and ureter, unspecified: Secondary | ICD-10-CM

## 2019-01-16 LAB — CBC WITH DIFFERENTIAL/PLATELET
Abs Immature Granulocytes: 0.01 10*3/uL (ref 0.00–0.07)
Basophils Absolute: 0.1 10*3/uL (ref 0.0–0.1)
Basophils Relative: 1 %
Eosinophils Absolute: 0.1 10*3/uL (ref 0.0–0.5)
Eosinophils Relative: 1 %
HCT: 44.3 % (ref 36.0–46.0)
Hemoglobin: 13.8 g/dL (ref 12.0–15.0)
Immature Granulocytes: 0 %
Lymphocytes Relative: 32 %
Lymphs Abs: 1.6 10*3/uL (ref 0.7–4.0)
MCH: 26.5 pg (ref 26.0–34.0)
MCHC: 31.2 g/dL (ref 30.0–36.0)
MCV: 85.2 fL (ref 80.0–100.0)
Monocytes Absolute: 0.6 10*3/uL (ref 0.1–1.0)
Monocytes Relative: 12 %
Neutro Abs: 2.7 10*3/uL (ref 1.7–7.7)
Neutrophils Relative %: 54 %
Platelets: 103 10*3/uL — ABNORMAL LOW (ref 150–400)
RBC: 5.2 MIL/uL — ABNORMAL HIGH (ref 3.87–5.11)
RDW: 15.9 % — ABNORMAL HIGH (ref 11.5–15.5)
WBC: 5.1 10*3/uL (ref 4.0–10.5)
nRBC: 0 % (ref 0.0–0.2)

## 2019-01-16 LAB — COMPREHENSIVE METABOLIC PANEL
ALT: 12 U/L (ref 0–44)
AST: 16 U/L (ref 15–41)
Albumin: 3.4 g/dL — ABNORMAL LOW (ref 3.5–5.0)
Alkaline Phosphatase: 46 U/L (ref 38–126)
Anion gap: 6 (ref 5–15)
BUN: 36 mg/dL — ABNORMAL HIGH (ref 8–23)
CO2: 26 mmol/L (ref 22–32)
Calcium: 8.9 mg/dL (ref 8.9–10.3)
Chloride: 104 mmol/L (ref 98–111)
Creatinine, Ser: 1.65 mg/dL — ABNORMAL HIGH (ref 0.44–1.00)
GFR calc Af Amer: 32 mL/min — ABNORMAL LOW (ref >60)
GFR calc non Af Amer: 28 mL/min — ABNORMAL LOW (ref >60)
Glucose, Bld: 98 mg/dL (ref 70–99)
Potassium: 3.9 mmol/L (ref 3.5–5.1)
Sodium: 136 mmol/L (ref 135–145)
Total Bilirubin: 0.9 mg/dL (ref 0.3–1.2)
Total Protein: 7.2 g/dL (ref 6.5–8.1)

## 2019-01-16 LAB — FOLATE: Folate: 12.6 ng/mL (ref >5.9)

## 2019-01-16 LAB — LACTATE DEHYDROGENASE: LDH: 152 U/L (ref 98–192)

## 2019-01-16 LAB — VITAMIN B12: Vitamin B-12: 393 pg/mL (ref 180–914)

## 2019-01-16 LAB — URIC ACID: Uric Acid, Serum: 6.8 mg/dL (ref 2.5–7.1)

## 2019-01-16 NOTE — Progress Notes (Signed)
No new changes noted today 

## 2019-01-16 NOTE — Patient Instructions (Signed)
Revlimid Lenalidomide Oral Capsules What is this medicine? LENALIDOMIDE (len a LID oh mide) is a chemotherapy drug that targets specific proteins within cancer cells and stops the cancer cell from growing. It is used to treat multiple myeloma, certain types of lymphoma, and some myelodysplastic syndromes that cause severe anemia requiring blood transfusions. This medicine may be used for other purposes; ask your health care provider or pharmacist if you have questions. COMMON BRAND NAME(S): Revlimid What should I tell my health care provider before I take this medicine? They need to know if you have any of these conditions: -blood clots in the legs or the lungs -high blood pressure -high cholesterol -infection -irregular monthly periods or menstrual cycles -kidney disease -liver disease -smoke tobacco -thyroid disease -an unusual or allergic reaction to lenalidomide, thalidomide, other medicines, foods, dyes, or preservatives -pregnant or trying to get pregnant -breast-feeding How should I use this medicine? Take this medicine by mouth with a glass of water. Follow the directions on the prescription label. Do not cut, crush, or chew this medicine. Take your medicine at regular intervals. Do not take it more often than directed. Do not stop taking except on your doctor's advice. A MedGuide will be given with each prescription and refill. Read this guide carefully each time. The MedGuide may change frequently. Talk to your pediatrician regarding the use of this medicine in children. Special care may be needed. Overdosage: If you think you have taken too much of this medicine contact a poison control center or emergency room at once. NOTE: This medicine is only for you. Do not share this medicine with others. What if I miss a dose? If you miss a dose, take it as soon as you can. If your next dose is to be taken in less than 12 hours, then do not take the missed dose. Take the next dose at  your regular time. Do not take double or extra doses. What may interact with this medicine? This medicine may interact with the following medications: -digoxin -medicines that increase the risk of thrombosis like estrogens or erythropoietic agents (e.g., epoetin alfa and darbepoetin alfa) -warfarin This list may not describe all possible interactions. Give your health care provider a list of all the medicines, herbs, non-prescription drugs, or dietary supplements you use. Also tell them if you smoke, drink alcohol, or use illegal drugs. Some items may interact with your medicine. What should I watch for while using this medicine? You may need blood work done while you are taking this medicine. This medicine is available only through a special program. Doctors, pharmacies, and patients must meet all of the conditions of the program. Your health care provider will help you get signed up with the program if you need this medicine. Through the program you will only receive up to a 28 day supply of the medicine at one time. You will need a new prescription for each refill. This medicine can cause birth defects. Do not get pregnant while taking this drug. Females with child-bearing potential will need to have 2 negative pregnancy tests before starting this medicine. Pregnancy testing must be done every 2 to 4 weeks as directed while taking this medicine. Use 2 reliable forms of birth control together while you are taking this medicine and for 4 weeks after you stop taking this medicine. If you think that you might be pregnant talk to your doctor right away. Do not breast-feed an infant while taking this medicine. Men must use a latex condom   during sexual contact with a woman while taking this medicine and for 4 weeks after you stop taking this medicine. A latex condom is needed even if you have had a vasectomy. Contact your doctor right away if your partner becomes pregnant. Do not donate sperm while taking  this medicine and for 4 weeks after you stop taking this medicine. Do not give blood while taking the medicine and for 4 weeks after completion of treatment to avoid exposing pregnant women to the medicine through the donated blood. Talk to your doctor about your risk of cancer. You may be more at risk for certain types of cancers if you take this medicine. What side effects may I notice from receiving this medicine? Side effects that you should report to your doctor or health care professional as soon as possible: -allergic reactions like skin rash, itching or hives, swelling of the face, lips, or tongue -breathing problems -chest pain or tightness -fast, irregular heartbeat -fever with rash, swollen lymph nodes, or swelling of the face -low blood counts - this medicine may decrease the number of white blood cells, red blood cells and platelets. You may be at increased risk for infections and bleeding. -seizures -signs and symptoms of bleeding such as bloody or black, tarry stools; red or dark-brown urine; spitting up blood or brown material that looks like coffee grounds; red spots on the skin; unusual bruising or bleeding from the eye, gums, or nose -signs and symptoms of a blood clot such as breathing problems; changes in vision; chest pain; severe, sudden headache; pain, swelling, warmth in the leg; trouble speaking; sudden numbness or weakness of the face, arm or leg -signs and symptoms of liver injury like dark yellow or brown urine; general ill feeling or flu-like symptoms; light-colored stools; loss of appetite; nausea; right upper belly pain; unusually weak or tired; yellowing of the eyes or skin -signs and symptoms of a stroke like changes in vision; confusion; trouble speaking or understanding; severe headaches; sudden numbness or weakness of the face, arm or leg; trouble walking; dizziness; loss of balance or coordination -sweating -vomiting Side effects that usually do not require  medical attention (report to your doctor or health care professional if they continue or are bothersome): -constipation -cough -diarrhea -joint pain -muscle cramps -swelling of the arms, legs, or skin -tiredness -trouble sleeping This list may not describe all possible side effects. Call your doctor for medical advice about side effects. You may report side effects to FDA at 1-800-FDA-1088. Where should I keep my medicine? Keep out of the reach of children. Store at room temperature between 15 and 30 degrees C (59 and 86 degrees F). Throw away any unused medicine after the expiration date. NOTE: This sheet is a summary. It may not cover all possible information. If you have questions about this medicine, talk to your doctor, pharmacist, or health care provider.  2019 [employer] (2018-03-05 15:00:38)  

## 2019-01-16 NOTE — Progress Notes (Signed)
[facility]     [address]     [place], [place] [address]     Phone: [phone]      Fax: [phone]        Clinic day:  01/16/19  Chief Complaint: Kristy Solomon is a 85 y.o. female with lymphoplasmacytic lymphoma who is seen for a 5 month assessment on Ibrutinib.  HPI: The patient was last seen in the medical oncology clinic on 09/11/2018.  At that time, she denied any B symptoms.  She felt "fine".  Exam was stable.  CT scans revealed a mixed response with decrease in superficial chest masses, mild increase in bilateral pulmonary nodules, and stable left renal mass.  We discussed switch from ibrutinib to bendamustine and Rituxan (BR).  She was hesistant.  We discussed referral to Dr Lonia Blood for second opinion.  The patient had a telephone visit with Dr Lonia Blood on 01/14/2019.  Notes reviewed.  She felt well and did not have any of the SQ nodules that she had on presentation. Her last IgM in 09/2018 was 1.5 gm. Her last CBC showed a mild thrombocytopenia.  ANC and hemoglobin were normal.  Given her age and feeling well, he recommended continuing ibrutinib and trending her CBC and IgM paraprotein every 4 months.  If she progresses and develops new symptoms, she can be started on Revlimid.  Due to her age, the goal of treatment is control, not deep remission, as long as she remains asymptomatic.   During the interim, she has felt "okay".  She denies any fevers, sweats or weight loss.  She denies any subcutaneous nodules.  She denies any shortness of breath.  She is eating well.  She denies any change in urine output.  She states it is been a while since she had a gout flare.  She remains on allopurinol.   Past Medical History:  Diagnosis Date  . 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 Hyperlipidemia   . Hypertension   . Malignant lymphoma, lymphoplasmacytic ([place]) 12/27/2013  . Renal insufficiency     Past Surgical History:  Procedure Laterality  Date  . ABDOMINAL HYSTERECTOMY    . HEMORRHOID SURGERY      Family History  Problem Relation Age of Onset  . Cancer Brother        throat ca  . Cancer Brother        bone cancer    Social History:  reports that she has never smoked. She has never used smokeless tobacco. She reports that she does not drink alcohol or use drugs.  She lives in [place] with her grandson and his wife. Her emergency contact is Cecille Po, her grandaughter- [phone]).  [name]'s phone number [phone]).  She lives in [place].  She has transportation issues.  She states that her grandson's wife can assist with travel (needs to be arranged).  She is alone today.  Allergies: No Known Allergies  Current Medications: Current Outpatient Medications  Medication Sig Dispense Refill  . allopurinol (ZYLOPRIM) 100 MG tablet Take 2 tablets (200 mg total) by mouth daily. 180 tablet 1  . amLODipine-benazepril (LOTREL) 5-20 MG capsule amlodipine 5 mg-benazepril 20 mg capsule  amLODIPine Besy-Benazepril HCl 5-20 MG Oral Capsule QTY: 90 capsule Days: 90 Refills: 1  Written: 09/21/18 Patient Instructions: 1 every morning    . atorvastatin (LIPITOR) 20 MG tablet Take 20 mg by mouth daily.    . Calcium Citrate-Vitamin D 315-250 MG-UNIT TABS Take 1  tablet by mouth daily.     . diclofenac sodium (VOLTAREN) 1 % GEL Apply 4 g topically 2 (two) times daily as needed. 1 Tube 0  . folic acid (FOLVITE) 1 MG tablet TAKE 1 TABLET BY MOUTH ONCE DAILY 90 tablet 0  . furosemide (LASIX) 20 MG tablet furosemide 20 mg tablet  take 1 tablet by mouth every morning    . gabapentin (NEURONTIN) 300 MG capsule Take 300 mg by mouth 3 (three) times daily.     . hydrALAZINE (APRESOLINE) 25 MG tablet Take 1 tablet (25 mg total) by mouth 3 (three) times daily. 90 tablet 2  . IMBRUVICA 420 MG TABS TAKE 1 TABLET (420 MG) BY MOUTH DAILY. 28 tablet 2  . metoprolol tartrate (LOPRESSOR) 25 MG tablet metoprolol tartrate 25 mg tablet  take 1  tablet by mouth twice a day    . predniSONE (DELTASONE) 10 MG tablet 20 mg po daily for 2 days, then, 10 mg po daily for 2 days. 6 tablet 0  . traMADol (ULTRAM) 50 MG tablet Take 1-2 tablets by mouth every 6 hours as needed for moderate to severe pain 20 tablet 0  . colchicine 0.6 MG tablet Take 0.6 mg by mouth as needed (gout).   0   No current facility-administered medications for this visit.    Facility-Administered Medications Ordered in Other Visits  Medication Dose Route Frequency Provider Last Rate Last Dose  . cyanocobalamin ((VITAMIN B-12)) injection 1,000 mcg  1,000 mcg Intramuscular Once Lequita Asal, MD        Review of Systems  Constitutional: Positive for weight loss (2 pounds). Negative for chills, diaphoresis, fever and malaise/fatigue.       Feels "ok".  HENT: Negative.  Negative for congestion, ear discharge, ear pain, nosebleeds, sinus pain, sore throat and tinnitus.   Eyes: Negative.  Negative for blurred vision, double vision, photophobia, pain, discharge and redness.  Respiratory: Negative.  Negative for cough, hemoptysis, sputum production and shortness of breath.   Cardiovascular: Negative.  Negative for chest pain, palpitations, leg swelling and PND.  Gastrointestinal: Negative.  Negative for abdominal pain, blood in stool, constipation, diarrhea, heartburn, melena, nausea and vomiting.  Genitourinary: Negative for frequency, hematuria and urgency.       H/o nephrolithiasis. No hematuria.  LEFT renal mass.   Musculoskeletal: Negative.  Negative for back pain, falls, joint pain, myalgias and neck pain.       H/o gout.  No recent flares.  Skin: Negative.  Negative for itching and rash.       No SQ nodules.  Neurological: Negative.  Negative for dizziness, tingling, tremors, sensory change, speech change, focal weakness, weakness and headaches.  Endo/Heme/Allergies: Negative.  Does not bruise/bleed easily.  Psychiatric/Behavioral: Negative.  Negative for  depression and memory loss. The patient is not nervous/anxious and does not have insomnia.   All other systems reviewed and are negative.  Performance status (ECOG):1  Vital signs BP 124/69 (BP Location: Left Arm, Patient Position: Sitting)   Pulse (!) 44 Comment: patient states this her normal  Temp 97.7 F (36.5 C) (Tympanic)   Resp 18   Ht 5\' 3"  (1.6 m)   Wt 201 lb 4.5 oz (91.3 kg)   SpO2 100%   BMI 35.66 kg/m   Physical Exam  Constitutional: She is oriented to person, place, and time and well-developed, well-nourished, and in no distress. No distress.  HENT:  Head: Normocephalic and atraumatic.  Mouth/Throat: Oropharynx is clear and moist and  mucous membranes are normal. No oropharyngeal exudate.  Wearing a brown hat.  Gray hair.  Eyes: Pupils are equal, round, and reactive to light. Conjunctivae and EOM are normal. No scleral icterus.  Glasses. Brown eyes.   Neck: Normal range of motion. Neck supple.  Cardiovascular: Regular rhythm, normal heart sounds and intact distal pulses. Bradycardia present. Exam reveals no gallop.  No murmur heard. Pulmonary/Chest: Effort normal and breath sounds normal. No respiratory distress. She has no wheezes. She has no rales.  Abdominal: Soft. Bowel sounds are normal. She exhibits no distension and no mass. There is no abdominal tenderness. There is no rebound and no guarding.  Musculoskeletal: Normal range of motion.        General: No tenderness or edema (chronic lower extremity changes).  Lymphadenopathy:    She has no cervical adenopathy.  Neurological: She is alert and oriented to person, place, and time.  Skin: Skin is warm and dry. No rash noted. She is not diaphoretic. No erythema. No pallor.  No palpable SQ masses.  Psychiatric: Mood, affect and judgment normal.  Nursing note and vitals reviewed.   Appointment on 01/16/2019  Component Date Value Ref Range Status  . LDH 01/16/2019 152  98 - 192 U/L Final   Performed at [facility], [address] [place], [place] [address]  . Uric Acid, Serum 01/16/2019 6.8  2.5 - 7.1 mg/dL Final   Performed at [facility], [address] [place], [place] [address]  . WBC 01/16/2019 5.1  4.0 - 10.5 K/uL Final  . RBC 01/16/2019 5.20* 3.87 - 5.11 MIL/uL Final  . Hemoglobin 01/16/2019 13.8  12.0 - 15.0 g/dL Final  . HCT 01/16/2019 44.3  36.0 - 46.0 % Final  . MCV 01/16/2019 85.2  80.0 - 100.0 fL Final  . MCH 01/16/2019 26.5  26.0 - 34.0 pg Final  . MCHC 01/16/2019 31.2  30.0 - 36.0 g/dL Final  . RDW 01/16/2019 15.9* 11.5 - 15.5 % Final  . Platelets 01/16/2019 103* 150 - 400 K/uL Final   Comment: Immature Platelet Fraction may be clinically indicated, consider ordering this additional test [id number]   . nRBC 01/16/2019 0.0  0.0 - 0.2 % Final   Performed at [facility], [address] [place], [place] [address]  . Neutrophils Relative % 01/16/2019 PENDING  % Incomplete  . Neutro Abs 01/16/2019 PENDING  1.7 - 7.7 K/uL Incomplete  . Band Neutrophils 01/16/2019 PENDING  % Incomplete  . Lymphocytes Relative 01/16/2019 PENDING  % Incomplete  . Lymphs Abs 01/16/2019 PENDING  0.7 - 4.0 K/uL Incomplete  . Monocytes Relative 01/16/2019 PENDING  % Incomplete  . Monocytes Absolute 01/16/2019 PENDING  0.1 - 1.0 K/uL Incomplete  . Eosinophils Relative 01/16/2019 PENDING  % Incomplete  . Eosinophils Absolute 01/16/2019 PENDING  0.0 - 0.5 K/uL Incomplete  . Basophils Relative 01/16/2019 PENDING  % Incomplete  . Basophils Absolute 01/16/2019 PENDING  0.0 - 0.1 K/uL Incomplete  . WBC Morphology 01/16/2019 PENDING   Incomplete  . RBC Morphology 01/16/2019 PENDING   Incomplete  . Smear Review 01/16/2019 PENDING   Incomplete  . Other 01/16/2019 PENDING  % Incomplete  . nRBC 01/16/2019 PENDING  0 /100 WBC Incomplete  . Metamyelocytes Relative 01/16/2019 PENDING  % Incomplete  . Myelocytes 01/16/2019 PENDING  % Incomplete  . Promyelocytes Relative  01/16/2019 PENDING  % Incomplete  . Blasts 01/16/2019 PENDING  % Incomplete  . Sodium 01/16/2019 136  135 -  145 mmol/L Final  . Potassium 01/16/2019 3.9  3.5 - 5.1 mmol/L Final  . Chloride 01/16/2019 104  98 - 111 mmol/L Final  . CO2 01/16/2019 26  22 - 32 mmol/L Final  . Glucose, Bld 01/16/2019 98  70 - 99 mg/dL Final  . BUN 01/16/2019 36* 8 - 23 mg/dL Final  . Creatinine, Ser 01/16/2019 1.65* 0.44 - 1.00 mg/dL Final  . Calcium 01/16/2019 8.9  8.9 - 10.3 mg/dL Final  . Total Protein 01/16/2019 7.2  6.5 - 8.1 g/dL Final  . Albumin 01/16/2019 3.4* 3.5 - 5.0 g/dL Final  . AST 01/16/2019 16  15 - 41 U/L Final  . ALT 01/16/2019 12  0 - 44 U/L Final  . Alkaline Phosphatase 01/16/2019 46  38 - 126 U/L Final  . Total Bilirubin 01/16/2019 0.9  0.3 - 1.2 mg/dL Final  . GFR calc non Af Amer 01/16/2019 28* >60 mL/min Final  . GFR calc Af Amer 01/16/2019 32* >60 mL/min Final  . Anion gap 01/16/2019 6  5 - 15 Final   Performed at [facility], [address] [place], [place] [address]    Assessment:  Kristy Solomon is a 85 y.o. 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 She presented with massive subcutaneous deposits, pulmonary nodules, involvement of left kidney, and bowel. Disease was unresponsive to Rituxan in 2014.   Chest, abdomen, and pelvic CT scan on 03/17/2016 revealed progression of diffuse lymphoma with subcutaneous deposits throughout the subcutaneous fat of the chest, abdomen,and pelvis. There were multiple nodular infiltrates throughout the lungs, likely due to lymphoma but could also represent infection or atypical infection. There was massive retroperitoneal lymphadenopathy with diffuse retroperitoneal, mesenteric, and pelvic lymphadenopathy. There was wall thickening and pelvic small bowel probably representing lymphomas involvement. There was probable direct invasion of the left kidney.  Biopsy of the dominant SQ nodal  conglomeration around the right flank on 03/18/2016 revealed a persistent low-grade CD5 positive B-cell lymphoma.  Flow cytometry was positive for CD20, CD79a, CD5, CD138. Neoplastic cells were positive for kappa and cyclin D1.  CD 23 was negative.   Additional molecular markers are pending.  Bone marrow aspirate and biopsy on 03/18/2016 revealed no evidence of lymphoma or plasma cell neoplasm (0.2% kappa monoclonal plasma cells by flow analysis).  Marrow was hypercellular for age (50-60%) with trilineage hematopoiesis and no increased blasts. There was mild to moderate widespread increase in reticulin fibers. There was no storage iron detected.  The following studies were abnormal:  Beta2-microglobulin was 9.6 (0.6-2.4).  Serum viscosity was 5.5 (1.6-1.9).  Uric acid was 11.1 on 03/19/2016 and 5.7 on 05/23/2016.     Normal studies included:  hepatitis B surface antigen, hepatitis B core antibody total, hepatitis C antibody, HIV testing, and LDH (123).  G6PD assay was 9.2 (4.6-13.5).   She has iron deficiency (ferritin 27; iron saturation 11%), B12 deficiency (147), and folate deficiency (5.9).  She has chronic renal insufficiency (BUN was 30-35, creatinine was 1.38 - 1.80 with a GFR 29-39 ml/min).  She began B12 injections on 03/28/2016 (last 09/03/2018).  She is on folic acid.  Folate was 77.5 on 05/09/2016, 42 on 03/13/2017, and 11.4 on 01/08/2018.   She received 1 week of chlorambucil with a tapering dose of prednisone (began 03/30/2016).  Cortisol was 9.5 on 05/17/2016.  She began ibrutinib on 04/26/2016.  She has mild thrombocytopenia (94,000).   SPEP has been followed:  5.0 gm/dL on 03/18/2016, 4.5 on 04/25/2016,  2.3  on 06/20/2016, 2.0 on 10/24/2016, 1.5 on 12/19/2016, 1.4 on 03/13/2017, 1.1 on 07/01/2017, 1.3 on 01/08/2018, 0.9 on 04/12/2018, and 1.0 on 07/06/2018. .  IgM has been followed:  > 5850 on 03/18/2016, > 5850 on 04/25/2016, 4652 on 06/09/2016, 3719 on 07/18/2016, 3429 on 10/24/2016,  2821 on 12/19/2016, 2295 on 03/13/2017, 2500 on 06/30/2017, 1890 09/29/2017, 1769 on 01/08/2018, 1244 on 04/12/2018, 1509 on 07/06/2018, 1567 on 09/11/2018, and 1281 on 01/16/2019.  Serum viscosity has been followed: 5.5 on 03/18/2016 and 1.6 on 09/11/2018.  Chest, abdomen, and pelvic CT on 04/21/2016 revealed a partial response to therapy.  There were bilateral pulmonary nodules/masses, measuring up to 5.6 cm in the right lower lobe (improved).  There was trace left pleural effusion.  There was a 12.7 cm left renal/perirenal mass (decreased) with associated mild left hydronephrosis.  Retroperitoneal/left pelvic lymphadenopathy was mildly decreased.  Multifocal subcutaneous nodules, measuring up to 4.5 cm in the left lower anterior abdominal wall were stable to mildly improved.  Abdomen and pelvic CT on 12/28/2017 was concerning for small bowel obstruction with transitional zone at the distal ileum.  There was no associated mass.  LEFT renal mass measured 6.1 x 5.0 cm (previously 13.7 x 10.7 cm).  Suspect combination of mass and adenopathy due to lymphoma in the left adrenal. This area was much less pronounced than on prior study. There was a 1.7 x 1.2 cm lymph node in the RIGHT external iliac node chain.  Subcutaneous deposits bilaterally were considerably smaller compared to 2017.  There were sizable calculi (2.0 x 1.7 cm, 2.6 x 2.0 cm) in each kidney without obstruction.  Chest, abdomen, and pelvic CT on 07/11/2018 revealed a hypodense 3.6 cm inferior LEFT thyroid nodule (stable), new subpleural medial 0.8 cm apical RUL nodule, new subpleural anterior 0.5 cm LUL nodule, basilar LLL mass measuring 4.6 x 3.3 cm (previously 4.2 x 3.2 cm) and a basilar RLL nodule measuring 1.4 cm (previously 0.6 cm). The LEFT upper breast mass measuring 2.4 x 2.0 cm (previously 3.8 x 2.1 cm). There were superficial RIGHT chest wall masses measuring up to 2.3 x 0.9 cm (previously 4.1 x 1.6 cm). There were several bulky stones  in the RIGHT renal pelvis measuring up to 22 x 11 mm, and a non-obstructing 13 mm LEFT renal stone. There was a coarsely calcified 2.3 cm anterior interpolar RIGHT renal cortical lesion, and a simple 1.8 cm anterior RIGHT renal cyst. The infiltrative soft tissue density, replacing the LEFT renal sinus and partially encasing the kidney/vascular pedicle, spanned up to 10.2 x 6.5 cm.   Symptomatically, she denies any B symptoms.  Exam reveals no adenopathy, hepatomegaly or SQ nodules.  She has asymptomatic bradycardia (pulse 44).  IgM is 1281 (improved).  Plan: 1. Labs today:  CBC with diff, CMP, IgM, LDH, uric acid. 2. Waldenstrm's macroglobulinemia  Clinically doing well on ibrutinib.  Re-review last scans: new lung nodules, smaller SQ chest wall and breast mass, and large left renal mass.  Review consult with Dr. Lonia Blood.  Discuss plan for Revlimid if disease progresses and develops new symptoms.  3. LEFT renal mass Mass encasing left kidney likely due to lymphoplasmacytic lymphoma/Waldenstrm's secondary to response to therapy. Continue to monitor. 4.   Renal insufficiency  Creatinine 1.65.    Creatinine 1.31-1.74 last 4 months.  Patient with know left renal involvement and nephrolithiasis.  Discuss importance of fluids.  Follow-up BMP in 2 weeks. 5.   Bradycardia  Pulse 44 confirmed on recheck.  Etiology likely  secondary to beta-blocker.  Vito Berger contacted PCP.  Patient told to hold metoprolol.  Patient to follow-up with PCP. 6.   RTC in 2 weeks for BMP. 7.   RTC in 3 months for MD assessment and labs (CBC with diff, CMP, IgM, LDH, uric acid).   I discussed the assessment and treatment plan with the patient.  The patient was provided an opportunity to ask questions and all were answered.  The patient agreed with the plan and demonstrated an understanding of the instructions.  The patient was advised to call back or seek an in person evaluation if the symptoms worsen or if the  condition fails to improve as anticipated.  I provided 25 minutes of face-to-face time during this this encounter and > 50% was spent counseling as documented under my assessment and plan.   Lequita Asal, MD, PhD  01/16/2019, 11:38 AM

## 2019-01-17 LAB — IGM: IgM (Immunoglobulin M), Srm: 1281 mg/dL — ABNORMAL HIGH (ref 26–217)

## 2019-01-21 ENCOUNTER — Telehealth: Payer: Self-pay

## 2019-01-21 NOTE — Telephone Encounter (Signed)
Spoke with Dr Conley [place] / nurse to inform them that Kristy Solomon HR was in the 40's with office visit today. The patient was supposed to have stopped Metoprolol 25 mg in December 2019. The patient does report she has been still taking the medication. I informed her and written the information down on paper to stop Metoprolol today. The patient was understanding and agreeable to stop medication.

## 2019-01-28 ENCOUNTER — Other Ambulatory Visit: Payer: Self-pay

## 2019-01-29 ENCOUNTER — Inpatient Hospital Stay: Payer: Medicare Other

## 2019-01-29 ENCOUNTER — Other Ambulatory Visit: Payer: Self-pay

## 2019-01-30 ENCOUNTER — Inpatient Hospital Stay: Payer: Medicare Other

## 2019-01-30 VITALS — BP 190/77 | HR 55 | Temp 98.0°F | Resp 18

## 2019-01-30 DIAGNOSIS — C880 Waldenstrom macroglobulinemia: Secondary | ICD-10-CM | POA: Diagnosis not present

## 2019-01-30 DIAGNOSIS — E538 Deficiency of other specified B group vitamins: Secondary | ICD-10-CM

## 2019-01-30 DIAGNOSIS — C8300 Small cell B-cell lymphoma, unspecified site: Secondary | ICD-10-CM

## 2019-01-30 LAB — BASIC METABOLIC PANEL
Anion gap: 7 (ref 5–15)
BUN: 42 mg/dL — ABNORMAL HIGH (ref 8–23)
CO2: 24 mmol/L (ref 22–32)
Calcium: 8.7 mg/dL — ABNORMAL LOW (ref 8.9–10.3)
Chloride: 107 mmol/L (ref 98–111)
Creatinine, Ser: 1.7 mg/dL — ABNORMAL HIGH (ref 0.44–1.00)
GFR calc Af Amer: 31 mL/min — ABNORMAL LOW (ref >60)
GFR calc non Af Amer: 27 mL/min — ABNORMAL LOW (ref >60)
Glucose, Bld: 78 mg/dL (ref 70–99)
Potassium: 3.7 mmol/L (ref 3.5–5.1)
Sodium: 138 mmol/L (ref 135–145)

## 2019-01-30 MED ORDER — CYANOCOBALAMIN 1000 MCG/ML IJ SOLN
1000.0000 ug | Freq: Once | INTRAMUSCULAR | Status: AC
  Administered 2019-01-30: 1000 ug via INTRAMUSCULAR

## 2019-02-21 MED FILL — IMBRUVICA 420 MG TAB: 420 | 28 days supply | Qty: 28 | Fill #2

## 2019-03-15 ENCOUNTER — Other Ambulatory Visit: Payer: Self-pay | Admitting: *Deleted

## 2019-03-15 ENCOUNTER — Telehealth: Payer: Self-pay

## 2019-03-15 DIAGNOSIS — C8300 Small cell B-cell lymphoma, unspecified site: Secondary | ICD-10-CM

## 2019-03-15 MED ORDER — IMBRUVICA 420 MG PO TABS: 420.0000 mg | ORAL_TABLET | Freq: Every day | ORAL | 2 refills | Status: DC

## 2019-03-15 NOTE — Telephone Encounter (Signed)
Medication refill Ibrutinib 420 mg # 28 with 2 refills. Patient was made aware.

## 2019-03-15 NOTE — Telephone Encounter (Signed)
Comprehensive metabolic panel Order: [id number] Status:  Final result Visible to patient:  No (not released) Next appt:  05/01/2019 at 10:30 AM in Oncology ([facility]) Dx:  Malignant lymphoma, lymphoplasmacytic.Kristy Solomon. (important suggestion)  Newer results are available. Click to view them now.   Ref Range & Units 59mo ago (01/16/19) 27mo ago (09/30/18) 45mo ago (09/19/18)  Sodium 135 - 145 mmol/L 136  135  138   Potassium 3.5 - 5.1 mmol/L 3.9  3.9  3.9   Chloride 98 - 111 mmol/L 104  103  109   CO2 22 - 32 mmol/L 26  23  23    Glucose, Bld 70 - 99 mg/dL 98  154High   157High    BUN 8 - 23 mg/dL 36High   21  40High    Creatinine, Ser 0.44 - 1.00 mg/dL 1.65High   1.31High   1.74High    Calcium 8.9 - 10.3 mg/dL 8.9  8.5Low   8.3Low    Total Protein 6.5 - 8.1 g/dL 7.2     Albumin 3.5 - 5.0 g/dL 3.4Low      AST 15 - 41 U/L 16     ALT 0 - 44 U/L 12     Alkaline Phosphatase 38 - 126 U/L 46     Total Bilirubin 0.3 - 1.2 mg/dL 0.9     GFR calc non Af Amer >60 mL/min 28Low   37Low   26Low    GFR calc Af Amer >60 mL/min 32Low   42Low   30Low    Anion gap 5 - 15 6  9  CM  6 CM   Comment: Performed at [facility], [address], [place], [place] [address]  Resulting Agency  [facility] CLIN LAB [facility] CLIN LAB [facility] CLIN LAB      Specimen Collected: 01/16/19 11:02 Last Resulted: 01/16/19 11:29     Lab Flowsheet   Order Details   View Encounter   Lab and Collection Details   Routing   Result History     CM=Additional comments      Other Results from 01/16/2019  Folate Order: [id number]  Status:  Final result Visible to patient:  No (not released) Next appt:  05/01/2019 at 10:30 AM in Oncology ([facility]) Dx:  Folate deficiency; B12 deficiency  Ref Range & Units 46mo ago 41mo ago 56yr ago  Folate >5.9 ng/mL 12.6  50.7 CM  11.4 CM   Comment: Performed at [facility], [address] [place], [place] [address]  Resulting Agency  [facility] CLIN LAB [facility] CLIN LAB [facility] CLIN LAB       Specimen Collected: 01/16/19 11:02 Last Resulted: 01/16/19 15:56     Lab Flowsheet   Order Details   View Encounter   Lab and Collection Details   Routing   Result History     CM=Additional comments        Vitamin B12 Order: [id number]  Status:  Final result Visible to patient:  No (not released) Next appt:  05/01/2019 at 10:30 AM in Oncology ([facility]) Dx:  Folate deficiency; B12 deficiency  Ref Range & Units 13mo ago 64yr ago 25yr ago  Vitamin B-12 180 - 914 pg/mL 393  695 CM  147Low  CM   Comment: (NOTE)  This assay is not validated for testing neonatal or  myeloproliferative syndrome specimens for Vitamin B12 levels.  Performed at [facility], [place] [address], [place], [place]  [address]   Resulting Agency  [facility] CLIN  LAB [place] CLIN LAB [place] CLIN LAB      Specimen Collected: 01/16/19 11:02 Last Resulted: 01/16/19 20:23     Lab Flowsheet   Order Details   View Encounter   Lab and Collection Details   Routing   Result History     CM=Additional comments        Lactate dehydrogenase Order: [id number]  Status:  Final result Visible to patient:  No (not released) Next appt:  05/01/2019 at 10:30 AM in Oncology ([facility]) Dx:  Malignant lymphoma, lymphoplasmacytic...  Ref Range & Units 64mo ago (01/16/19) 5yr ago (10/24/16) 17yr ago (03/18/16)  LDH 98 - 192 U/L 152  139  123   Comment: Performed at [facility], [address], [place], [place] [address]  Resulting Agency  [facility] CLIN LAB [place] CLIN LAB [facility] CLIN LAB      Specimen Collected: 01/16/19 11:02 Last Resulted: 01/16/19 11:29     Lab Flowsheet   Order Details   View Encounter   Lab and Collection Details   Routing   Result History           Contains abnormal data IgM Order: [id number]  Status:  Final result Visible to patient:  No (not released) Next appt:  05/01/2019 at 10:30 AM in Oncology ([facility]) Dx:  Malignant lymphoma, lymphoplasmacytic...  Ref Range &  Units 36mo ago 3mo ago 41mo ago  IgM (Immunoglobulin M), Srm 26 - 217 mg/dL 1,281High   1,567High  CM  1,509High  CM   Comment: (NOTE)  Results confirmed on  dilution.  Performed At: [facility]  [address] [place], [place] [id number]  Rush Farmer MD JS:[id number]   Resulting Agency  [facility] CLIN LAB [facility] CLIN LAB [facility] CL[facility] LAB      Specimen Collected: 01/16/19 11:02 Last Resulted: 01/17/19 05:37     Lab Flowsheet   Order Details   View Encounter   Lab and Collection Details   Routing   Result History     CM=Additional comments        Uric acid Order: [id number]  Status:  Final result Visible to patient:  No (not released) Next appt:  05/01/2019 at 10:30 AM in Oncology ([facility]) Dx:  Malignant lymphoma, lymphoplasmacytic...  Ref Range & Units 34mo ago 16mo ago 48mo ago  Uric Acid, Serum 2.5 - 7.1 mg/dL 6.8  8.2High  CM  6.9 CM   Comment: Performed at [facility], [address], [place], [place] [address]  Resulting Agency  [facility] CLIN LAB [facility] CLIN LAB [facility] CLIN LAB      Specimen Collected: 01/16/19 11:02 Last Resulted: 01/16/19 11:29     Lab Flowsheet   Order Details   View Encounter   Lab and Collection Details   Routing   Result History     CM=Additional comments        Contains abnormal data CBC with Differential Order: [id number]  Status:  Final result Visible to patient:  No (not released) Next appt:  05/01/2019 at 10:30 AM in Oncology ([facility]) Dx:  Malignant lymphoma, lymphoplasmacytic...  Ref Range & Units 12mo ago (01/16/19) 55mo ago (09/30/18) 35mo ago (09/18/18)  WBC 4.0 - 10.5 K/uL 5.1  5.4  4.3   RBC 3.87 - 5.11 MIL/uL 5.20High   5.93High   5.64High    Hemoglobin 12.0 - 15.0 g/dL 13.8  15.0  14.2   HCT 36.0 - 46.0 % 44.3  48.7High   46.5High  MCV 80.0 - 100.0 fL 85.2  82.1  82.4   MCH 26.0 - 34.0 pg 26.5  25.3Low   25.2Low    MCHC 30.0 - 36.0 g/dL 31.2  30.8  30.5   RDW 11.5 - 15.5 % 15.9High   15.9High    16.1High    Platelets 150 - 400 K/uL 103Low   92Low  CM  80Low  CM   Comment: Immature Platelet Fraction may be  clinically indicated, consider  ordering this additional test  [id number]   nRBC 0.0 - 0.2 % 0.0  0.0  0.0 CM   Neutrophils Relative % % 54  72    Neutro Abs 1.7 - 7.7 K/uL 2.7  3.9    Lymphocytes Relative % 32  17    Lymphs Abs 0.7 - 4.0 K/uL 1.6  0.9    Monocytes Relative % 12  9    Monocytes Absolute 0.1 - 1.0 K/uL 0.6  0.5    Eosinophils Relative % 1  0    Eosinophils Absolute 0.0 - 0.5 K/uL 0.1  0.0    Basophils Relative % 1  1    Basophils Absolute 0.0 - 0.1 K/uL 0.1  0.0    Immature Granulocytes % 0  1    Abs Immature Granulocytes 0.00 - 0.07 K/uL 0.01  0.05 CM    Comment: Performed at [facility], [address] [place], [place] [address]  Resulting Agency  [facility] CLIN LAB [facility] CLIN LAB [facility] CLIN LAB      Specimen Collected: 01/16/19 11:02 Last Resulted: 01/16/19 12:31

## 2019-03-25 MED FILL — IMBRUVICA 420 MG TAB: 420 | 28 days supply | Qty: 28 | Fill #0

## 2019-04-22 MED FILL — IMBRUVICA 420 MG TAB: 420 | 28 days supply | Qty: 28 | Fill #1

## 2019-04-29 NOTE — Progress Notes (Signed)
[facility]  [address] [place], [place] [address] Phone: [phone]  Fax: [phone]   Clinic Day:  05/01/2019  Referring physician: Jodi Marble, MD  Chief Complaint: Kristy Solomon is a 85 y.o. female with lymphoplasmacytic lymphoma who is seen for a 3 month assessment on Ibrutinib.  HPI: The patient was last seen in the medical oncology clinic on 01/16/2019. At that time, she denied any B symptoms.  Exam revealed no adenopathy, hepatomegaly or SQ nodules.  She had asymptomatic bradycardia (pulse 44).  IgM was 1281 (improved).  During the interim, she been doing "fine." She denies any fevers, sweats, or weight loss. Weight is up 9lbs in the clinic today. She denies any visual or skin changes. She denies any bowel issues, bleeding, or bone and joint issues. She reports chronic, bilateral lower extremity edema.   Blood pressure in the clinic today is 125/74 with a pulse of 44. She denies any dizziness or lightheadedness. Metoprolol was discontinued 3 months ago. She is to follow-up with her PCP next week.    Past Medical History:  Diagnosis Date   Anemia in neoplastic disease 06/27/2014   Hyperlipidemia    Hypertension    Malignant lymphoma, lymphoplasmacytic ([place]) 12/27/2013   Renal insufficiency     Past Surgical History:  Procedure Laterality Date   ABDOMINAL HYSTERECTOMY     HEMORRHOID SURGERY      Family History  Problem Relation Age of Onset   Cancer Brother        throat ca   Cancer Brother        bone cancer    Social History:  reports that she has never smoked. She has never used smokeless tobacco. She reports that she does not drink alcohol or use drugs. She lives in [place] with her grandson and his wife. Her emergency contact is Cecille Po, her grandaughter- [phone]).  [name]'s phone number [phone]).  She lives in [place].  She has transportation issues.  She states that her grandson's  wife can assist with travel (needs to be arranged).  She is alone today.  Allergies: No Known Allergies  Current Medications: Current Outpatient Medications  Medication Sig Dispense Refill   allopurinol (ZYLOPRIM) 100 MG tablet Take 2 tablets (200 mg total) by mouth daily. 180 tablet 1   amLODipine-benazepril (LOTREL) 5-20 MG capsule amlodipine 5 mg-benazepril 20 mg capsule  amLODIPine Besy-Benazepril HCl 5-20 MG Oral Capsule QTY: 90 capsule Days: 90 Refills: 1  Written: 09/21/18 Patient Instructions: 1 every morning     atorvastatin (LIPITOR) 20 MG tablet Take 20 mg by mouth daily.     Calcium Citrate-Vitamin D 315-250 MG-UNIT TABS Take 1 tablet by mouth daily.      colchicine 0.6 MG tablet Take 0.6 mg by mouth as needed (gout).   0   diclofenac sodium (VOLTAREN) 1 % GEL Apply 4 g topically 2 (two) times daily as needed. 1 Tube 0   folic acid (FOLVITE) 1 MG tablet TAKE 1 TABLET BY MOUTH ONCE DAILY 90 tablet 0   furosemide (LASIX) 20 MG tablet furosemide 20 mg tablet  take 1 tablet by mouth every morning     gabapentin (NEURONTIN) 300 MG capsule Take 300 mg by mouth 3 (three) times daily.      hydrALAZINE (APRESOLINE) 25 MG tablet Take 1 tablet (25 mg total) by mouth 3 (three) times daily. 90 tablet 2   Ibrutinib (IMBRUVICA) 420 MG TABS Take 420 mg by mouth daily. [place]  tablet 2   omeprazole (PRILOSEC) 20 MG capsule TK 1 C PO D     potassium chloride (MICRO-K) 10 MEQ CR capsule TK 1 C PO QAM     metoprolol tartrate (LOPRESSOR) 25 MG tablet metoprolol tartrate 25 mg tablet  take 1 tablet by mouth twice a day     No current facility-administered medications for this visit.    Facility-Administered Medications Ordered in Other Visits  Medication Dose Route Frequency Provider Last Rate Last Dose   cyanocobalamin ((VITAMIN B-12)) injection 1,000 mcg  1,000 mcg Intramuscular Once Lequita Asal, MD        Review of Systems  Constitutional: Negative for chills, diaphoresis,  fever, malaise/fatigue and weight loss (Up 9lbs).       Feels "fine."  HENT: Negative.  Negative for congestion, ear discharge, ear pain, nosebleeds, sinus pain, sore throat and tinnitus.   Eyes: Negative.  Negative for blurred vision, double vision, photophobia, pain, discharge and redness.  Respiratory: Negative.  Negative for cough, hemoptysis, sputum production and shortness of breath.   Cardiovascular: Positive for leg swelling (BLE). Negative for chest pain, palpitations and PND.  Gastrointestinal: Negative.  Negative for abdominal pain, blood in stool, constipation, diarrhea, heartburn, melena, nausea and vomiting.  Genitourinary: Negative for dysuria, frequency, hematuria and urgency.       H/o nephrolithiasis. No hematuria.  LEFT renal mass.   Musculoskeletal: Negative.  Negative for back pain, falls, joint pain, myalgias and neck pain.       H/o gout.  No recent flares.  Skin: Negative.  Negative for itching and rash.       No SQ nodules.  Neurological: Negative.  Negative for dizziness, tingling, tremors, sensory change, speech change, focal weakness, weakness and headaches.  Endo/Heme/Allergies: Negative.  Does not bruise/bleed easily.  Psychiatric/Behavioral: Negative.  Negative for depression and memory loss. The patient is not nervous/anxious and does not have insomnia.   All other systems reviewed and are negative.  Performance status (ECOG): 0  Vitals Blood pressure 125/74, pulse (!) 44, temperature 97.9 F (36.6 C), temperature source Tympanic, resp. rate (!) 22, weight 210 lb 12.2 oz (95.6 kg), SpO2 100 %.   Physical Exam  Constitutional: She is oriented to person, place, and time. She appears well-developed and well-nourished. No distress.  HENT:  Head: Normocephalic and atraumatic.  Mouth/Throat: Oropharynx is clear and moist. No oropharyngeal exudate.  Shoulder-length gray hair. Mask.  Eyes: Pupils are equal, round, and reactive to light. Conjunctivae and EOM are  normal. No scleral icterus.  Glasses.   Neck: Normal range of motion. Neck supple.  Cardiovascular: Regular rhythm and normal heart sounds. Bradycardia present.  No murmur heard. Pulmonary/Chest: Effort normal and breath sounds normal. No respiratory distress. She has no wheezes.  Abdominal: Soft. Bowel sounds are normal. She exhibits no distension. There is no abdominal tenderness.  Musculoskeletal: Normal range of motion.        General: Edema (BLE R>L, 2-3+ chronic pitting edema) present.  Lymphadenopathy:    She has no cervical adenopathy.    She has no axillary adenopathy.       Right: No supraclavicular adenopathy present.       Left: No supraclavicular adenopathy present.  Neurological: She is alert and oriented to person, place, and time.  Skin: Skin is warm and dry. She is not diaphoretic. No erythema.  Psychiatric: She has a normal mood and affect. Her behavior is normal. Judgment and thought content normal.  Nursing note and vitals  reviewed.   Appointment on 05/01/2019  Component Date Value Ref Range Status   Uric Acid, Serum 05/01/2019 6.7  2.5 - 7.1 mg/dL Final   Performed at [facility], [address], [place], [place] [address]   LDH 05/01/2019 152  98 - 192 U/L Final   Performed at [facility], [address], [place], [place] [address]   Sodium 05/01/2019 138  135 - 145 mmol/L Final   Potassium 05/01/2019 3.8  3.5 - 5.1 mmol/L Final   Chloride 05/01/2019 108  98 - 111 mmol/L Final   CO2 05/01/2019 22  22 - 32 mmol/L Final   Glucose, Bld 05/01/2019 95  70 - 99 mg/dL Final   BUN 05/01/2019 34* 8 - 23 mg/dL Final   Creatinine, Ser 05/01/2019 1.69* 0.44 - 1.00 mg/dL Final   Calcium 05/01/2019 8.3* 8.9 - 10.3 mg/dL Final   Total Protein 05/01/2019 6.7  6.5 - 8.1 g/dL Final   Albumin 05/01/2019 3.3* 3.5 - 5.0 g/dL Final   AST 05/01/2019 14* 15 - 41 U/L Final   ALT 05/01/2019 15  0 - 44 U/L Final   Alkaline Phosphatase  05/01/2019 39  38 - 126 U/L Final   Total Bilirubin 05/01/2019 0.6  0.3 - 1.2 mg/dL Final   GFR calc non Af Amer 05/01/2019 27* >60 mL/min Final   GFR calc Af Amer 05/01/2019 31* >60 mL/min Final   Anion gap 05/01/2019 8  5 - 15 Final   Performed at [facility], [address] [place], [place] [address]   WBC 05/01/2019 4.8  4.0 - 10.5 K/uL Final   RBC 05/01/2019 4.72  3.87 - 5.11 MIL/uL Final   Hemoglobin 05/01/2019 12.6  12.0 - 15.0 g/dL Final   HCT 05/01/2019 40.3  36.0 - 46.0 % Final   MCV 05/01/2019 85.4  80.0 - 100.0 fL Final   MCH 05/01/2019 26.7  26.0 - 34.0 pg Final   MCHC 05/01/2019 31.3  30.0 - 36.0 g/dL Final   RDW 05/01/2019 16.1* 11.5 - 15.5 % Final   Platelets 05/01/2019 91* 150 - 400 K/uL Final   Comment: Immature Platelet Fraction may be clinically indicated, consider ordering this additional test [id number]    nRBC 05/01/2019 0.0  0.0 - 0.2 % Final   Neutrophils Relative % 05/01/2019 53  % Final   Neutro Abs 05/01/2019 2.6  1.7 - 7.7 K/uL Final   Lymphocytes Relative 05/01/2019 34  % Final   Lymphs Abs 05/01/2019 1.6  0.7 - 4.0 K/uL Final   Monocytes Relative 05/01/2019 11  % Final   Monocytes Absolute 05/01/2019 0.5  0.1 - 1.0 K/uL Final   Eosinophils Relative 05/01/2019 1  % Final   Eosinophils Absolute 05/01/2019 0.1  0.0 - 0.5 K/uL Final   Basophils Relative 05/01/2019 1  % Final   Basophils Absolute 05/01/2019 0.0  0.0 - 0.1 K/uL Final   Immature Granulocytes 05/01/2019 0  % Final   Abs Immature Granulocytes 05/01/2019 0.02  0.00 - 0.07 K/uL Final   Performed at [facility], [address] [place], [place] [address]    Assessment:  Kristy Solomon is a 85 y.o. 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 She presented with massive subcutaneous deposits, pulmonary nodules, involvement of left kidney, and bowel. Disease was unresponsive to Rituxan in 2014.   Chest,  abdomen, and pelvic CT scan on 03/17/2016 revealed progression of diffuse lymphoma with subcutaneous deposits throughout the  subcutaneous fat of the chest, abdomen,and pelvis. There were multiple nodular infiltrates throughout the lungs, likely due to lymphoma but could also represent infection or atypical infection. There was massive retroperitoneal lymphadenopathy with diffuse retroperitoneal, mesenteric, and pelvic lymphadenopathy. There was wall thickening and pelvic small bowel probably representing lymphomas involvement. There was probable direct invasion of the left kidney.  Biopsy of the dominant SQ nodal conglomeration around the right flank on 03/18/2016 revealed a persistent low-grade CD5 positive B-cell lymphoma.  Flow cytometry was positive for CD20, CD79a, CD5, CD138. Neoplastic cells were positive for kappa and cyclin D1.  CD 23 was negative.   Additional molecular markers are pending.  Bone marrow aspirate and biopsy on 03/18/2016 revealed no evidence of lymphoma or plasma cell neoplasm (0.2% kappa monoclonal plasma cells by flow analysis).  Marrow was hypercellular for age (50-60%) with trilineage hematopoiesis and no increased blasts. There was mild to moderate widespread increase in reticulin fibers. There was no storage iron detected.  The following studies were abnormal:  Beta2-microglobulin was 9.6 (0.6-2.4).  Serum viscosity was 5.5 (1.6-1.9).  Uric acid was 11.1 on 03/19/2016 and 5.7 on 05/23/2016.     Normal studies included:  hepatitis B surface antigen, hepatitis B core antibody total, hepatitis C antibody, HIV testing, and LDH (123).  G6PD assay was 9.2 (4.6-13.5).   She has iron deficiency (ferritin 27; iron saturation 11%), B12 deficiency (147), and folate deficiency (5.9).  She has chronic renal insufficiency (BUN was 30-35, creatinine was 1.38 - 1.80 with a GFR 29-39 ml/min).  She began B12 injections on 03/28/2016 (last 01/30/2019).  She is on folic acid.  Folate  was 77.5 on 05/09/2016, 42 on 03/13/2017, and 11.4 on 01/08/2018.   She received 1 week of chlorambucil with a tapering dose of prednisone (began 03/30/2016).  Cortisol was 9.5 on 05/17/2016.  She began ibrutinib on 04/26/2016.  She has mild thrombocytopenia (94,000).   SPEP has been followed:  5.0 gm/dL on 03/18/2016, 4.5 on 04/25/2016,  2.3 on 06/20/2016, 2.0 on 10/24/2016, 1.5 on 12/19/2016, 1.4 on 03/13/2017, 1.1 on 07/01/2017, 1.3 on 01/08/2018, 0.9 on 04/12/2018, and 1.0 on 07/06/2018.  IgM has been followed:  > 5850 on 03/18/2016, > 5850 on 04/25/2016, 4652 on 06/09/2016, 3719 on 07/18/2016, 3429 on 10/24/2016, 2821 on 12/19/2016, 2295 on 03/13/2017, 2500 on 06/30/2017, 1890 09/29/2017, 1769 on 01/08/2018, 1244 on 04/12/2018, 1509 on 07/06/2018, 1567 on 09/11/2018, and 1281 on 01/16/2019.  Serum viscosity has been followed: 5.5 on 03/18/2016 and 1.6 on 09/11/2018.  Chest, abdomen, and pelvic CT on 04/21/2016 revealed a partial response to therapy.  There were bilateral pulmonary nodules/masses, measuring up to 5.6 cm in the right lower lobe (improved).  There was trace left pleural effusion.  There was a 12.7 cm left renal/perirenal mass (decreased) with associated mild left hydronephrosis.  Retroperitoneal/left pelvic lymphadenopathy was mildly decreased.  Multifocal subcutaneous nodules, measuring up to 4.5 cm in the left lower anterior abdominal wall were stable to mildly improved.  Abdomen and pelvic CT on 12/28/2017 was concerning for small bowel obstruction with transitional zone at the distal ileum.  There was no associated mass.  LEFT renal mass measured 6.1 x 5.0 cm (previously 13.7 x 10.7 cm).  Suspect combination of mass and adenopathy due to lymphoma in the left adrenal. This area was much less pronounced than on prior study. There was a 1.7 x 1.2 cm lymph node in the RIGHT external iliac node chain.  Subcutaneous deposits bilaterally were considerably smaller compared to  2017.   There were sizable calculi (2.0 x 1.7 cm, 2.6 x 2.0 cm) in each kidney without obstruction.  Chest, abdomen, and pelvic CT on 07/11/2018 revealed a hypodense 3.6 cm inferior LEFT thyroid nodule (stable), new subpleural medial 0.8 cm apical RUL nodule, new subpleural anterior 0.5 cm LUL nodule, basilar LLL mass measuring 4.6 x 3.3 cm (previously 4.2 x 3.2 cm) and a basilar RLL nodule measuring 1.4 cm (previously 0.6 cm). The LEFT upper breast mass measuring 2.4 x 2.0 cm (previously 3.8 x 2.1 cm). There were superficial RIGHT chest wall masses measuring up to 2.3 x 0.9 cm (previously 4.1 x 1.6 cm). There were several bulky stones in the RIGHT renal pelvis measuring up to 22 x 11 mm, and a non-obstructing 13 mm LEFT renal stone. There was a coarsely calcified 2.3 cm anterior interpolar RIGHT renal cortical lesion, and a simple 1.8 cm anterior RIGHT renal cyst. The infiltrative soft tissue density, replacing the LEFT renal sinus and partially encasing the kidney/vascular pedicle, spanned up to 10.2 x 6.5 cm.   Symptomatically, she is doing well.  She denies any B symptoms.  Exam reveals no adenopathy or hepatosplenomegaly.  She has bilateral 2-3+ lower extremity edema.  Plan: 1.   Labs today:  CBC with diff, CMP, IgM, LDH, uric acid, B12, folate, ferritin, iron studies. 2.   Waldenstrm's macroglobulinemia  Clinically, she continues to do well on ibrutinib.   Exam reveals no palpable adenopathy or SQ masses.  CBC is stable with a hematocrit of 40.3, hemoglobin 12.6, platelets 91,000, white count 4800 (ANC 2600).  Schedule chest, abdomen, and pelvis CT without contrast on 07/12/2019.  Review plan for continuation of ibrutinib unless disease progresses. 3.   LEFT renal mass She has a known mass encasing the left kidney due to Waldenstrm's.   Discussed the reevaluation of this mass with upcoming imaging studies. 4.   Renal insufficiency             Creatinine 1.69.               Creatinine 1.31-1.74  last 4 months.             She has known left renal involvement with Waldenstrom's as well as nephrolithiasis.             Avoid contrast secondary to renal function.  Continue to monitor. 5.   Bradycardia             Pulse 44 confirmed on recheck.             Etiology initially felt secondary to beta-blocker.             She continues to have bradycardia.    She is asymptomatic.    Follow-up scheduled with PCP. 6.   Bilateral lower extremity edema  Bilateral duplex today r/o DVT. 7.   B12 deficiency  Patient has missed B12 secondary to COVID-19 (last 01/30/2019).  B12 today and monthly x 6. 8.   Folate deficiency  Folate 16.4 today.  Continue oral folate. 9.   RTC after CT scan for MD assessment, labs (CBC with diff, CMP, LDH, uric acid, SPEP, IgM), and review of imaging.  Addendum: Bilateral lower extremity duplex today revealed no evidence of DVT.  I discussed the assessment and treatment plan with the patient.  The patient was provided an opportunity to ask questions and all were answered.  The patient agreed with the plan and demonstrated an understanding of the instructions.  The patient was advised to call back if the symptoms worsen or if the condition fails to improve as anticipated.   Lequita Asal, MD, PhD    05/01/2019, 11:51 AM  I, Cloyde Reams Dorshimer, am acting as Education administrator for [employer]. Mike Gip, MD, PhD.  I, [place]oua Schreiner C. Mike Gip, MD, have reviewed the above documentation for accuracy and completeness, and I agree with the above.

## 2019-04-30 ENCOUNTER — Other Ambulatory Visit: Payer: Self-pay | Admitting: Hematology and Oncology

## 2019-04-30 DIAGNOSIS — D500 Iron deficiency anemia secondary to blood loss (chronic): Secondary | ICD-10-CM

## 2019-04-30 DIAGNOSIS — E538 Deficiency of other specified B group vitamins: Secondary | ICD-10-CM

## 2019-05-01 ENCOUNTER — Ambulatory Visit
Admission: RE | Admit: 2019-05-01 | Discharge: 2019-05-01 | Disposition: A | Discharge status: Home / Self Care | Payer: Medicare Other | Source: Ambulatory Visit | Attending: Hematology and Oncology | Admitting: Hematology and Oncology

## 2019-05-01 ENCOUNTER — Telehealth: Payer: Self-pay

## 2019-05-01 ENCOUNTER — Inpatient Hospital Stay: Payer: Medicare Other

## 2019-05-01 ENCOUNTER — Encounter: Payer: Self-pay | Admitting: Hematology and Oncology

## 2019-05-01 ENCOUNTER — Inpatient Hospital Stay: Payer: Medicare Other | Attending: Hematology and Oncology

## 2019-05-01 ENCOUNTER — Inpatient Hospital Stay (HOSPITAL_BASED_OUTPATIENT_CLINIC_OR_DEPARTMENT_OTHER): Payer: Medicare Other | Admitting: Hematology and Oncology

## 2019-05-01 ENCOUNTER — Other Ambulatory Visit: Payer: Self-pay

## 2019-05-01 VITALS — BP 125/74 | HR 44 | Temp 97.9°F | Resp 22 | Wt 210.8 lb

## 2019-05-01 DIAGNOSIS — D500 Iron deficiency anemia secondary to blood loss (chronic): Secondary | ICD-10-CM

## 2019-05-01 DIAGNOSIS — N189 Chronic kidney disease, unspecified: Secondary | ICD-10-CM | POA: Insufficient documentation

## 2019-05-01 DIAGNOSIS — D419 Neoplasm of uncertain behavior of unspecified urinary organ: Secondary | ICD-10-CM

## 2019-05-01 DIAGNOSIS — C880 Waldenstrom macroglobulinemia: Secondary | ICD-10-CM | POA: Insufficient documentation

## 2019-05-01 DIAGNOSIS — E538 Deficiency of other specified B group vitamins: Secondary | ICD-10-CM | POA: Diagnosis not present

## 2019-05-01 DIAGNOSIS — R600 Localized edema: Secondary | ICD-10-CM | POA: Diagnosis present

## 2019-05-01 DIAGNOSIS — C8300 Small cell B-cell lymphoma, unspecified site: Secondary | ICD-10-CM

## 2019-05-01 DIAGNOSIS — Z7189 Other specified counseling: Secondary | ICD-10-CM

## 2019-05-01 LAB — COMPREHENSIVE METABOLIC PANEL
ALT: 15 U/L (ref 0–44)
AST: 14 U/L — ABNORMAL LOW (ref 15–41)
Albumin: 3.3 g/dL — ABNORMAL LOW (ref 3.5–5.0)
Alkaline Phosphatase: 39 U/L (ref 38–126)
Anion gap: 8 (ref 5–15)
BUN: 34 mg/dL — ABNORMAL HIGH (ref 8–23)
CO2: 22 mmol/L (ref 22–32)
Calcium: 8.3 mg/dL — ABNORMAL LOW (ref 8.9–10.3)
Chloride: 108 mmol/L (ref 98–111)
Creatinine, Ser: 1.69 mg/dL — ABNORMAL HIGH (ref 0.44–1.00)
GFR calc Af Amer: 31 mL/min — ABNORMAL LOW (ref >60)
GFR calc non Af Amer: 27 mL/min — ABNORMAL LOW (ref >60)
Glucose, Bld: 95 mg/dL (ref 70–99)
Potassium: 3.8 mmol/L (ref 3.5–5.1)
Sodium: 138 mmol/L (ref 135–145)
Total Bilirubin: 0.6 mg/dL (ref 0.3–1.2)
Total Protein: 6.7 g/dL (ref 6.5–8.1)

## 2019-05-01 LAB — CBC WITH DIFFERENTIAL/PLATELET
Abs Immature Granulocytes: 0.02 10*3/uL (ref 0.00–0.07)
Basophils Absolute: 0 10*3/uL (ref 0.0–0.1)
Basophils Relative: 1 %
Eosinophils Absolute: 0.1 10*3/uL (ref 0.0–0.5)
Eosinophils Relative: 1 %
HCT: 40.3 % (ref 36.0–46.0)
Hemoglobin: 12.6 g/dL (ref 12.0–15.0)
Immature Granulocytes: 0 %
Lymphocytes Relative: 34 %
Lymphs Abs: 1.6 10*3/uL (ref 0.7–4.0)
MCH: 26.7 pg (ref 26.0–34.0)
MCHC: 31.3 g/dL (ref 30.0–36.0)
MCV: 85.4 fL (ref 80.0–100.0)
Monocytes Absolute: 0.5 10*3/uL (ref 0.1–1.0)
Monocytes Relative: 11 %
Neutro Abs: 2.6 10*3/uL (ref 1.7–7.7)
Neutrophils Relative %: 53 %
Platelets: 91 10*3/uL — ABNORMAL LOW (ref 150–400)
RBC: 4.72 MIL/uL (ref 3.87–5.11)
RDW: 16.1 % — ABNORMAL HIGH (ref 11.5–15.5)
WBC: 4.8 10*3/uL (ref 4.0–10.5)
nRBC: 0 % (ref 0.0–0.2)

## 2019-05-01 LAB — IRON AND TIBC
Iron: 70 ug/dL (ref 28–170)
Saturation Ratios: 22 % (ref 10.4–31.8)
TIBC: 320 ug/dL (ref 250–450)
UIBC: 250 ug/dL

## 2019-05-01 LAB — VITAMIN B12: Vitamin B-12: 337 pg/mL (ref 180–914)

## 2019-05-01 LAB — FERRITIN: Ferritin: 43 ng/mL (ref 11–307)

## 2019-05-01 LAB — URIC ACID: Uric Acid, Serum: 6.7 mg/dL (ref 2.5–7.1)

## 2019-05-01 LAB — LACTATE DEHYDROGENASE: LDH: 152 U/L (ref 98–192)

## 2019-05-01 LAB — FOLATE: Folate: 16.4 ng/mL (ref >5.9)

## 2019-05-01 MED ORDER — CYANOCOBALAMIN 1000 MCG/ML IJ SOLN
1000.0000 ug | Freq: Once | INTRAMUSCULAR | Status: AC
  Administered 2019-05-01: 1000 ug via INTRAMUSCULAR

## 2019-05-01 NOTE — Patient Instructions (Signed)

## 2019-05-01 NOTE — Telephone Encounter (Signed)
Contacted Dr. Bailey Mech office and talked with nurse, Caryl Pina. Made her aware of manual HR (44) while in office. Informed her patient denies any symptoms at this time. Advised nurse on BP meds patient states she is currently taking. Caryl Pina made appt. For patient for Monday, 05/06/19, at 1445. Patient made aware and educated patient if she develops any symptoms (dizziness, lightheadedness, chest pain, etc.) then to go to ED. Patient verbalizes understanding and denies any further questions or concerns.

## 2019-05-01 NOTE — Progress Notes (Signed)
Patient here for follow up. Denies any concerns.  

## 2019-05-02 LAB — IGM: IgM (Immunoglobulin M), Srm: 1094 mg/dL — ABNORMAL HIGH (ref 26–217)

## 2019-05-07 DIAGNOSIS — R600 Localized edema: Secondary | ICD-10-CM | POA: Insufficient documentation

## 2019-05-22 MED FILL — IMBRUVICA 420 MG TAB: 420 | 28 days supply | Qty: 28 | Fill #2

## 2019-05-29 ENCOUNTER — Other Ambulatory Visit: Payer: Self-pay

## 2019-05-29 ENCOUNTER — Inpatient Hospital Stay: Payer: Medicare HMO | Attending: Hematology and Oncology

## 2019-05-29 ENCOUNTER — Inpatient Hospital Stay: Payer: Medicare HMO | Attending: Internal Medicine

## 2019-05-29 DIAGNOSIS — E538 Deficiency of other specified B group vitamins: Secondary | ICD-10-CM

## 2019-05-29 MED ORDER — CYANOCOBALAMIN 1000 MCG/ML IJ SOLN
1000.0000 ug | Freq: Once | INTRAMUSCULAR | Status: AC
  Administered 2019-05-29: 14:00:00 1000 ug via INTRAMUSCULAR
  Filled 2019-05-29: qty 1

## 2019-06-26 ENCOUNTER — Inpatient Hospital Stay: Payer: Medicare HMO | Attending: Hematology and Oncology

## 2019-06-26 ENCOUNTER — Other Ambulatory Visit: Payer: Self-pay | Admitting: Hematology and Oncology

## 2019-06-26 ENCOUNTER — Other Ambulatory Visit: Payer: Self-pay

## 2019-06-26 DIAGNOSIS — E538 Deficiency of other specified B group vitamins: Secondary | ICD-10-CM

## 2019-06-26 MED ORDER — CYANOCOBALAMIN 1000 MCG/ML IJ SOLN: 1000.0000 ug | Freq: Once | INTRAMUSCULAR | Status: DC

## 2019-06-26 MED ORDER — CYANOCOBALAMIN 1000 MCG/ML IJ SOLN
1000.0000 ug | Freq: Once | INTRAMUSCULAR | Status: AC
  Administered 2019-06-26: 1000 ug via INTRAMUSCULAR

## 2019-07-08 ENCOUNTER — Other Ambulatory Visit: Payer: Self-pay | Admitting: Hematology and Oncology

## 2019-07-08 DIAGNOSIS — C8300 Small cell B-cell lymphoma, unspecified site: Secondary | ICD-10-CM

## 2019-07-08 MED FILL — IMBRUVICA 420 MG TAB: 420 | 28 days supply | Qty: 28 | Fill #0

## 2019-07-12 ENCOUNTER — Ambulatory Visit
Admission: RE | Admit: 2019-07-12 | Discharge: 2019-07-12 | Disposition: A | Discharge status: Home / Self Care | Payer: Medicare HMO | Source: Ambulatory Visit | Attending: Hematology and Oncology | Admitting: Hematology and Oncology

## 2019-07-12 ENCOUNTER — Other Ambulatory Visit: Payer: Self-pay

## 2019-07-12 DIAGNOSIS — C8300 Small cell B-cell lymphoma, unspecified site: Secondary | ICD-10-CM | POA: Insufficient documentation

## 2019-07-19 ENCOUNTER — Encounter: Payer: Self-pay | Admitting: Hematology and Oncology

## 2019-07-19 NOTE — Progress Notes (Signed)
No new changes noted. The patient Name and DOB has been verified.

## 2019-07-22 ENCOUNTER — Encounter: Payer: Self-pay | Admitting: Hematology and Oncology

## 2019-07-22 ENCOUNTER — Inpatient Hospital Stay: Payer: Medicare HMO | Attending: Hematology and Oncology | Admitting: Hematology and Oncology

## 2019-07-22 ENCOUNTER — Inpatient Hospital Stay: Payer: Medicare HMO

## 2019-07-22 ENCOUNTER — Other Ambulatory Visit: Payer: Self-pay

## 2019-07-22 ENCOUNTER — Ambulatory Visit
Admission: EM | Admit: 2019-07-22 | Discharge: 2019-07-22 | Disposition: A | Discharge status: Home / Self Care | Payer: Medicare HMO | Attending: Family Medicine | Admitting: Family Medicine

## 2019-07-22 ENCOUNTER — Telehealth: Payer: Self-pay

## 2019-07-22 VITALS — BP 103/83 | HR 50 | Temp 98.2°F | Resp 20 | Wt 208.6 lb

## 2019-07-22 DIAGNOSIS — R001 Bradycardia, unspecified: Secondary | ICD-10-CM | POA: Diagnosis present

## 2019-07-22 DIAGNOSIS — D696 Thrombocytopenia, unspecified: Secondary | ICD-10-CM | POA: Diagnosis not present

## 2019-07-22 DIAGNOSIS — C8300 Small cell B-cell lymphoma, unspecified site: Secondary | ICD-10-CM

## 2019-07-22 DIAGNOSIS — N289 Disorder of kidney and ureter, unspecified: Secondary | ICD-10-CM | POA: Diagnosis not present

## 2019-07-22 DIAGNOSIS — N189 Chronic kidney disease, unspecified: Secondary | ICD-10-CM | POA: Diagnosis not present

## 2019-07-22 DIAGNOSIS — D419 Neoplasm of uncertain behavior of unspecified urinary organ: Secondary | ICD-10-CM | POA: Diagnosis not present

## 2019-07-22 DIAGNOSIS — N2889 Other specified disorders of kidney and ureter: Secondary | ICD-10-CM | POA: Insufficient documentation

## 2019-07-22 DIAGNOSIS — Z79899 Other long term (current) drug therapy: Secondary | ICD-10-CM | POA: Diagnosis not present

## 2019-07-22 DIAGNOSIS — C8510 Unspecified B-cell lymphoma, unspecified site: Secondary | ICD-10-CM | POA: Insufficient documentation

## 2019-07-22 DIAGNOSIS — E785 Hyperlipidemia, unspecified: Secondary | ICD-10-CM | POA: Insufficient documentation

## 2019-07-22 DIAGNOSIS — N179 Acute kidney failure, unspecified: Secondary | ICD-10-CM | POA: Diagnosis not present

## 2019-07-22 DIAGNOSIS — E538 Deficiency of other specified B group vitamins: Secondary | ICD-10-CM

## 2019-07-22 DIAGNOSIS — I10 Essential (primary) hypertension: Secondary | ICD-10-CM | POA: Insufficient documentation

## 2019-07-22 DIAGNOSIS — D500 Iron deficiency anemia secondary to blood loss (chronic): Secondary | ICD-10-CM

## 2019-07-22 DIAGNOSIS — Z791 Long term (current) use of non-steroidal anti-inflammatories (NSAID): Secondary | ICD-10-CM | POA: Insufficient documentation

## 2019-07-22 DIAGNOSIS — C880 Waldenstrom macroglobulinemia: Secondary | ICD-10-CM | POA: Diagnosis not present

## 2019-07-22 LAB — CBC WITH DIFFERENTIAL/PLATELET
Abs Immature Granulocytes: 0.01 10*3/uL (ref 0.00–0.07)
Basophils Absolute: 0 10*3/uL (ref 0.0–0.1)
Basophils Relative: 1 %
Eosinophils Absolute: 0.1 10*3/uL (ref 0.0–0.5)
Eosinophils Relative: 2 %
HCT: 42.8 % (ref 36.0–46.0)
Hemoglobin: 12.9 g/dL (ref 12.0–15.0)
Immature Granulocytes: 0 %
Lymphocytes Relative: 31 %
Lymphs Abs: 1.5 10*3/uL (ref 0.7–4.0)
MCH: 25.7 pg — ABNORMAL LOW (ref 26.0–34.0)
MCHC: 30.1 g/dL (ref 30.0–36.0)
MCV: 85.3 fL (ref 80.0–100.0)
Monocytes Absolute: 0.6 10*3/uL (ref 0.1–1.0)
Monocytes Relative: 12 %
Neutro Abs: 2.5 10*3/uL (ref 1.7–7.7)
Neutrophils Relative %: 54 %
Platelets: 87 10*3/uL — ABNORMAL LOW (ref 150–400)
RBC: 5.02 MIL/uL (ref 3.87–5.11)
RDW: 15.3 % (ref 11.5–15.5)
WBC: 4.7 10*3/uL (ref 4.0–10.5)
nRBC: 0 % (ref 0.0–0.2)

## 2019-07-22 LAB — COMPREHENSIVE METABOLIC PANEL
ALT: 18 U/L (ref 0–44)
AST: 19 U/L (ref 15–41)
Albumin: 3.5 g/dL (ref 3.5–5.0)
Alkaline Phosphatase: 48 U/L (ref 38–126)
Anion gap: 7 (ref 5–15)
BUN: 54 mg/dL — ABNORMAL HIGH (ref 8–23)
CO2: 27 mmol/L (ref 22–32)
Calcium: 8.5 mg/dL — ABNORMAL LOW (ref 8.9–10.3)
Chloride: 103 mmol/L (ref 98–111)
Creatinine, Ser: 2.33 mg/dL — ABNORMAL HIGH (ref 0.44–1.00)
GFR calc Af Amer: 21 mL/min — ABNORMAL LOW (ref >60)
GFR calc non Af Amer: 18 mL/min — ABNORMAL LOW (ref >60)
Glucose, Bld: 135 mg/dL — ABNORMAL HIGH (ref 70–99)
Potassium: 4.2 mmol/L (ref 3.5–5.1)
Sodium: 137 mmol/L (ref 135–145)
Total Bilirubin: 0.2 mg/dL — ABNORMAL LOW (ref 0.3–1.2)
Total Protein: 7.3 g/dL (ref 6.5–8.1)

## 2019-07-22 LAB — LACTATE DEHYDROGENASE: LDH: 170 U/L (ref 98–192)

## 2019-07-22 LAB — URIC ACID: Uric Acid, Serum: 8.5 mg/dL — ABNORMAL HIGH (ref 2.5–7.1)

## 2019-07-22 MED ORDER — CYANOCOBALAMIN 1000 MCG/ML IJ SOLN
1000.0000 ug | Freq: Once | INTRAMUSCULAR | Status: AC
  Administered 2019-07-22: 1000 ug via INTRAMUSCULAR

## 2019-07-22 NOTE — Patient Instructions (Signed)

## 2019-07-22 NOTE — Progress Notes (Signed)
Patient here for follow up. Denies any concerns.  

## 2019-07-22 NOTE — ED Triage Notes (Signed)
Pt was at cancer center for an appointment and was found to have a low and irregular pulse. States she feels "fine" and is at her baseline but was told to come here for eval.

## 2019-07-22 NOTE — Telephone Encounter (Signed)
Labs have been faxed over to Dr Kristy Solomon office i have also called and left a message on Dr Kristy Solomon Nurse voice mail to inform her of the increase creatinine levels and Per Dr Mike Gip she would like for the patient to be schedule as soon as possible.

## 2019-07-22 NOTE — Discharge Instructions (Addendum)
Stop Benazepril. Stop Lasix. No Metoprolol. Cardiology to call tomorrow.  Take care  Dr. Lacinda Axon

## 2019-07-22 NOTE — ED Provider Notes (Signed)
[facility]    CSN: [id number] Arrival date & time: 07/22/19  1428  History   Chief Complaint Chief Complaint  Patient presents with  . Irregular Heart Beat   HPI  85 year old female with a complicated past medical history presents with irregular heartbeat from the cancer center.  Patient reports that she feels well.  Denies chest pain.  Denies shortness of breath.  Patient states that she was sent over from the cancer center for an abnormal heartbeat/rhythm.  Patient has had similar issues with bradycardia noted in the EMR from a prior hospitalization.  She has seen cardiology in the hospital previously.  Metoprolol was stopped.  Patient denies abdominal pain.  No other symptoms or complaints at this time.  PMH, Surgical Hx, Family Hx, Social History reviewed and updated as below.  Past Medical History:  Diagnosis Date  . 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 Hyperlipidemia   . Hypertension   . 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 Renal insufficiency     Patient Active Problem List   Diagnosis Date Noted  . Bilateral edema of lower extremity 05/07/2019  . HLD (hyperlipidemia) 09/18/2018  . HTN (hypertension) 09/18/2018  . GERD (gastroesophageal reflux disease) 09/18/2018  . Back pain 09/18/2018  . Bradycardia 09/18/2018  . Bigeminy 09/18/2018  . Nephrolithiasis 01/11/2018  . Goals of care, counseling/discussion 12/28/2017  . Gastroenteritis 12/28/2017  . B12 deficiency 03/18/2016  . Folate deficiency 03/18/2016  . Iron deficiency anemia 03/18/2016  . Pneumonia 03/17/2016  . Pressure ulcer 03/17/2016  . Thrombocytopenia ([place]) 07/04/2014  . Acute renal failure ([place]) 07/04/2014  . Hyperuricemia 07/04/2014  . 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 Calculi, ureter 07/03/2012  . Frank hematuria 07/03/2012  . Hydronephrosis 07/03/2012  . 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 Calculus of kidney 07/02/2012  . Nonspecific finding on examination of urine 07/02/2012    Past Surgical History:  Procedure Laterality Date  . ABDOMINAL HYSTERECTOMY    . HEMORRHOID SURGERY      OB History   No obstetric history on file.      Home Medications    Prior to Admission medications   Medication Sig Start Date End Date Taking? Authorizing Provider  allopurinol (ZYLOPRIM) 100 MG tablet Take 2 tablets (200 mg total) by mouth daily. 09/11/18   Karen Kitchens, NP  amLODipine-benazepril (LOTREL) 5-20 MG capsule amlodipine 5 mg-benazepril 20 mg capsule  amLODIPine Besy-Benazepril HCl 5-20 MG Oral Capsule QTY: 90 capsule Days: 90 Refills: 1  Written: 09/21/18 Patient Instructions: 1 every morning 09/21/18   [provider]  atorvastatin (LIPITOR) 20 MG tablet Take 20 mg by mouth daily. 12/24/13   [provider]  Calcium Citrate-Vitamin D 315-250 MG-UNIT TABS Take 1 tablet by mouth daily.  06/27/12   [provider]  colchicine 0.6 MG tablet Take 0.6 mg by mouth as needed (gout).     [provider]  diclofenac sodium (VOLTAREN) 1 % GEL Apply 4 g topically 2 (two) times daily as needed. Patient not taking: Reported on 07/19/2019 09/15/18   Johnn Hai, PA-C  folic acid (FOLVITE) 1 MG tablet TAKE 1 TABLET BY MOUTH ONCE DAILY 07/03/18   Lequita Asal, MD  gabapentin (NEURONTIN) 300 MG capsule Take 300 mg by mouth 3 (three) times daily.  [provider]  hydrALAZINE (APRESOLINE) 25 MG tablet Take 1 tablet (25 mg total) by mouth 3 (three) times daily. 10/01/18 10/01/19  Hinda Kehr, MD  IMBRUVICA 420 MG TABS TAKE 420 MG BY MOUTH DAILY. 07/08/19   Lequita Asal, MD  omeprazole (PRILOSEC) 20 MG capsule TK 1 C PO D 04/15/19   [provider]  potassium chloride  (MICRO-K) 10 MEQ CR capsule TK 1 C PO QAM 04/25/19   [provider]    Family History Family History  Problem Relation Age of Onset  . Cancer Brother        throat ca  . Cancer Brother        bone cancer    Social History Social History   Tobacco Use  . Smoking status: Never Smoker  . Smokeless tobacco: Never Used  Substance Use Topics  . Alcohol use: No  . Drug use: No     Allergies   Patient has no known allergies.   Review of Systems Review of Systems  Constitutional: Negative for fever.  Respiratory: Negative.   Cardiovascular: Negative.    Physical Exam Triage Vital Signs ED Triage Vitals  Enc Vitals Group     BP 07/22/19 1448 120/60     Pulse Rate 07/22/19 1448 (!) 55     Resp 07/22/19 1448 18     Temp 07/22/19 1448 98 F (36.7 C)     Temp Source 07/22/19 1448 Oral     SpO2 07/22/19 1448 96 %     Weight 07/22/19 1445 208 lb 8.9 oz (94.6 kg)     Height 07/22/19 1445 5\' 4"  (1.626 m)     Head Circumference --      Peak Flow --      Pain Score 07/22/19 1445 0     Pain Loc --      Pain Edu? --      Excl. in [place]? --    Updated Vital Signs BP 120/60 (BP Location: Left Arm)   Pulse (!) 55 Comment: irregular  Temp 98 F (36.7 C) (Oral)   Resp 18   Ht 5\' 4"  (1.626 m)   Wt 94.6 kg   SpO2 96%   BMI 35.80 kg/m   Visual Acuity Right Eye Distance:   Left Eye Distance:   Bilateral Distance:    Right Eye Near:   Left Eye Near:    Bilateral Near:     Physical Exam Vitals signs and nursing note reviewed.  Constitutional:      General: She is not in acute distress.    Appearance: Normal appearance. She is not ill-appearing.  HENT:     Head: Normocephalic and atraumatic.  Eyes:     General:        Right eye: No discharge.        Left eye: No discharge.     Conjunctiva/sclera: Conjunctivae normal.  Cardiovascular:     Rate and Rhythm: Bradycardia present.     Heart sounds: No murmur.     Comments: Regularly regular.  Pulmonary:      Effort: Pulmonary effort is normal.     Breath sounds: Normal breath sounds. No wheezing, rhonchi or rales.  Neurological:     Mental Status: She is alert.  Psychiatric:        Mood and Affect: Mood normal.        Behavior: Behavior normal.    UC Treatments / Results  Labs (all labs ordered are listed, but  only abnormal results are displayed) Labs Reviewed - No data to display  EKG Interpretation: Sinus bradycardia with a rate of 78.  Frequent PVCs.  Fusion complexes noted.  Left axis deviation.  Radiology No results found.  Procedures Procedures (including critical care time)  Medications Ordered in UC Medications - No data to display  Initial Impression / Assessment and Plan / UC Course  I have reviewed the triage vital signs and the nursing notes.  Pertinent labs & imaging results that were available during my care of the patient were reviewed by me and considered in my medical decision making (see chart for details).    85 year old female presents with bradycardia.  Laboratory studies obtained today at the cancer center and revealed an increase in her baseline creatinine.  Creatinine 2.33 today.  Likely has acute kidney injury.  Discussed case with cardiology who was more concerned about renal insufficiency as opposed to her EKG findings.  They advised to stop benazepril, Lasix, and make sure she is no longer taking metoprolol.  Increase fluid intake.  Cardiology to call for appointment this week.  Her nephrologist has been contacted per the electronic medical record.  Patient being transported back to the cancer center.  Final Clinical Impressions(s) / UC Diagnoses   Final diagnoses:  AKI (acute kidney injury) ([place])  Bradycardia     Discharge Instructions     Stop Benazepril. Stop Lasix. No Metoprolol. Cardiology to call tomorrow.  Take care  Dr. Lacinda Axon    ED Prescriptions    None     PDMP not reviewed this encounter.   Coral Spikes, [place] 07/22/19 1604

## 2019-07-22 NOTE — Telephone Encounter (Signed)
Incoming call from Dr. Lacinda Axon ([phone]) with [facility]. [facility] with bradycardia.   Ekg printed and shown to Dr. Rockey Situ. Reviewed labs, Cr elevated, per labs pt appears dry.   Dr. Lacinda Axon reports that patient is feeling well. BP 120/60.  Advice per Dr. Rockey Situ Stop Lasix Hydrate Stop benazepril Could start beta blocker as she is not currently taking.  Make referral to our clinic to be seen later this week by [facility].

## 2019-07-22 NOTE — Progress Notes (Signed)
[facility]  [address], [address] [place], [place] [address] Phone: [phone]  Fax: [phone]   Clinic Day:  07/22/2019  Referring physician: Jodi Marble, MD  Chief Complaint: Kristy Solomon is a 85 y.o. female with lymphoplasmacytic lymphoma who is seen for a 3 month assessment on Ibrutinib.  HPI:  The patient was last seen in the medical oncology clinic on 05/01/2019. At that time, she was doing well.  She denied any B symptoms.  Exam revealed no adenopathy or hepatosplenomegaly.  She had bilateral 2-3+ lower extremity edema.  Hematocrit was 40.3, hemoglobin 12.6, MCV 85.4, platelets 91,000, white count 4800 with an ANC of 2600.  LDH was 152.  Ferritin was 43 with an iron saturation of 22% and a TIBC of 320.  B12 was 337 and folate 16.4.  IgM was 1094.  Bilateral lower extremity venous doppler ultrasound on 05/01/2019 revealed no evidence of deep venous thrombosis in either lower extremity.  Chest abdomen and pelvis CT on 07/12/2019 revealed stable left breast lesion. The other subcutaneous lesions involving the right chest wall had decreased in size since the prior examination.  There were no new or progressive findings.  There was improved CT appearance of the lungs. There was persistent patchy airspace opacities and scarring type changes but no discrete mass or nodule. Here was no mediastinal or hilar adenopathy. There was stable soft tissue density infiltrating the left kidney and left renal sinus and vascular pedicle region. There was stable bilateral renal calculi. There was no abdominal/pelvic lymphadenopathy or inguinal adenopathy. There was stable severe atherosclerotic disease.   During the interim, she has felt fine. She continues to have edema in her bilateral lower extremities.  She was placed on Lasix 20 mg on 01/16/2019 at [facility].  She continues on ibrutinib and denies missed doses.   There is some concern about possible atrial  fibrillation as her heart rate was irregular both mechanical and manually. Her last echocardiogram was measured with an EF of 60-65%. She is referred to urgent care for an EKG.    Past Medical History:  Diagnosis Date   Anemia in neoplastic disease 06/27/2014   Hyperlipidemia    Hypertension    Malignant lymphoma, lymphoplasmacytic ([place]) 12/27/2013   Renal insufficiency     Past Surgical History:  Procedure Laterality Date   ABDOMINAL HYSTERECTOMY     HEMORRHOID SURGERY      Family History  Problem Relation Age of Onset   Cancer Brother        throat ca   Cancer Brother        bone cancer    Social History:  reports that she has never smoked. She has never used smokeless tobacco. She reports that she does not drink alcohol or use drugs. She lives in [place] with her grandson and his wife. Her emergency contact is Cecille Po, her grandaughter- [phone]).  [name]'s phone number [phone]).  She lives in [place].  She has transportation issues.  She states that her grandson's wife can assist with travel (needs to be arranged). The patient is alone today.   Allergies: No Known Allergies  Current Medications: Current Outpatient Medications  Medication Sig Dispense Refill   allopurinol (ZYLOPRIM) 100 MG tablet Take 2 tablets (200 mg total) by mouth daily. 180 tablet 1   amLODipine-benazepril (LOTREL) 5-20 MG capsule amlodipine 5 mg-benazepril 20 mg capsule  amLODIPine Besy-Benazepril HCl 5-20 MG Oral Capsule QTY: 90 capsule Days: 90 Refills: 1  Written: 09/21/18  Patient Instructions: 1 every morning     atorvastatin (LIPITOR) 20 MG tablet Take 20 mg by mouth daily.     Calcium Citrate-Vitamin D 315-250 MG-UNIT TABS Take 1 tablet by mouth daily.      colchicine 0.6 MG tablet Take 0.6 mg by mouth as needed (gout).   0   folic acid (FOLVITE) 1 MG tablet TAKE 1 TABLET BY MOUTH ONCE DAILY 90 tablet 0   furosemide (LASIX) 20 MG tablet furosemide 20 mg  tablet  take 1 tablet by mouth every morning     gabapentin (NEURONTIN) 300 MG capsule Take 300 mg by mouth 3 (three) times daily.      hydrALAZINE (APRESOLINE) 25 MG tablet Take 1 tablet (25 mg total) by mouth 3 (three) times daily. 90 tablet 2   IMBRUVICA 420 MG TABS TAKE 420 MG BY MOUTH DAILY. 28 tablet 2   omeprazole (PRILOSEC) 20 MG capsule TK 1 C PO D     potassium chloride (MICRO-K) 10 MEQ CR capsule TK 1 C PO QAM     diclofenac sodium (VOLTAREN) 1 % GEL Apply 4 g topically 2 (two) times daily as needed. (Patient not taking: Reported on 07/19/2019) 1 Tube 0   metoprolol tartrate (LOPRESSOR) 25 MG tablet metoprolol tartrate 25 mg tablet  take 1 tablet by mouth twice a day     No current facility-administered medications for this visit.    Facility-Administered Medications Ordered in Other Visits  Medication Dose Route Frequency Provider Last Rate Last Dose   cyanocobalamin ((VITAMIN B-12)) injection 1,000 mcg  1,000 mcg Intramuscular Once Lequita Asal, MD        Review of Systems  Constitutional: Positive for weight loss (2 pounds). Negative for chills, diaphoresis, fever and malaise/fatigue.       Feels "ok".  "I am fine".  No complaints.  HENT: Negative.  Negative for congestion, ear discharge, ear pain, nosebleeds, sinus pain, sore throat and tinnitus.   Eyes: Negative.  Negative for blurred vision, double vision, photophobia, pain, discharge and redness.  Respiratory: Negative.  Negative for cough, hemoptysis, sputum production and shortness of breath.   Cardiovascular: Positive for leg swelling (BLE, improved; now able to wear shoes). Negative for chest pain, palpitations, orthopnea, claudication and PND.  Gastrointestinal: Negative.  Negative for abdominal pain, blood in stool, constipation, diarrhea, heartburn, melena, nausea and vomiting.  Genitourinary: Negative for dysuria, frequency, hematuria and urgency.       H/o nephrolithiasis. No hematuria.  LEFT renal  mass.   Musculoskeletal: Negative.  Negative for back pain, falls, joint pain, myalgias and neck pain.       H/o gout.  No recent flares.  Skin: Negative.  Negative for itching and rash.       No SQ nodules.  Neurological: Negative.  Negative for dizziness, tingling, tremors, sensory change, speech change, focal weakness, weakness and headaches.  Endo/Heme/Allergies: Negative.  Does not bruise/bleed easily.  Psychiatric/Behavioral: Negative.  Negative for depression and memory loss. The patient is not nervous/anxious and does not have insomnia.   All other systems reviewed and are negative.  Performance status (ECOG):  0  Vitals Blood pressure 103/83, pulse (!) 50, temperature 98.2 F (36.8 C), temperature source Tympanic, resp. rate 20, weight 208 lb 8.9 oz (94.6 kg), SpO2 100 %.   Physical Exam  Constitutional: She is oriented to person, place, and time. She appears well-developed and well-nourished. No distress. Face mask in place.  HENT:  Head: Normocephalic and atraumatic.  Mouth/Throat: Oropharynx is clear and moist. No oropharyngeal exudate.  Shoulder-length gray hair. Mask.  Eyes: Pupils are equal, round, and reactive to light. Conjunctivae and EOM are normal. No scleral icterus.  Glasses.   Neck: Normal range of motion. Neck supple.  Cardiovascular: Normal heart sounds. An irregularly irregular rhythm present. Bradycardia present.  No murmur heard. Pulmonary/Chest: Effort normal and breath sounds normal. No respiratory distress. She has no wheezes.  Abdominal: Soft. Bowel sounds are normal. She exhibits no distension. There is no abdominal tenderness.  Musculoskeletal: Normal range of motion.        General: Edema (bilateral lower extremity R > L, 2+) present.  Lymphadenopathy:       Head (right side): No preauricular, no posterior auricular and no occipital adenopathy present.       Head (left side): No preauricular, no posterior auricular and no occipital adenopathy  present.    She has no cervical adenopathy.    She has no axillary adenopathy.       Right: No inguinal and no supraclavicular adenopathy present.       Left: No inguinal and no supraclavicular adenopathy present.  Neurological: She is alert and oriented to person, place, and time.  Skin: Skin is warm and dry. She is not diaphoretic. No erythema.  Psychiatric: She has a normal mood and affect. Her behavior is normal. Judgment and thought content normal.  Nursing note and vitals reviewed.   Imaging studies: 03/17/2016:  Chest, abdomen, and pelvis CT revealed progression of diffuse lymphoma with subcutaneous deposits throughout the subcutaneous fat of the chest, abdomen,and pelvis. There were multiple nodular infiltrates throughout the lungs, likely due to lymphoma but could also represent infection or atypical infection. There was massive retroperitoneal lymphadenopathy with diffuse retroperitoneal, mesenteric, and pelvic lymphadenopathy. There was wall thickening and pelvic small bowel probably representing lymphomas involvement. There was probable direct invasion of the left kidney. 04/21/2016:  Chest, abdomen, and pelvis CT revealed a partial response to therapy.  There were bilateral pulmonary nodules/masses, measuring up to 5.6 cm in the right lower lobe (improved).  There was trace left pleural effusion.  There was a 12.7 cm left renal/perirenal mass (decreased) with associated mild left hydronephrosis.  Retroperitoneal/left pelvic lymphadenopathy was mildly decreased.  Multifocal subcutaneous nodules, measuring up to 4.5 cm in the left lower anterior abdominal wall were stable to mildly improved. 12/28/2017:  Abdomen and pelvis CT was concerning for small bowel obstruction with transitional zone at the distal ileum.  There was no associated mass.  LEFT renal mass measured 6.1 x 5.0 cm (previously 13.7 x 10.7 cm).  Suspect combination of mass and adenopathy due to lymphoma in the left  adrenal. This area was much less pronounced than on prior study. There was a 1.7 x 1.2 cm lymph node in the RIGHT external iliac node chain.  Subcutaneous deposits bilaterally were considerably smaller compared to 2017.  There were sizable calculi (2.0 x 1.7 cm, 2.6 x 2.0 cm) in each kidney without obstruction. 07/11/2018:  Chest, abdomen, and pelvis CT revealed a hypodense 3.6 cm inferior LEFT thyroid nodule (stable), new subpleural medial 0.8 cm apical RUL nodule, new subpleural anterior 0.5 cm LUL nodule, basilar LLL mass measuring 4.6 x 3.3 cm (previously 4.2 x 3.2 cm) and a basilar RLL nodule measuring 1.4 cm (previously 0.6 cm). The LEFT upper breast mass measuring 2.4 x 2.0 cm (previously 3.8 x 2.1 cm). There were superficial RIGHT chest wall masses measuring up to 2.3 x 0.9 cm (  previously 4.1 x 1.6 cm). There were several bulky stones in the RIGHT renal pelvis measuring up to 22 x 11 mm, and a non-obstructing 13 mm LEFT renal stone. There was a coarsely calcified 2.3 cm anterior interpolar RIGHT renal cortical lesion, and a simple 1.8 cm anterior RIGHT renal cyst. The infiltrative soft tissue density, replacing the LEFT renal sinus and partially encasing the kidney/vascular pedicle, spanned up to 10.2 x 6.5 cm.  07/12/2019:  Chest abdomen and pelvis CT revealed stable left breast lesion. The other subcutaneous lesions involving the right chest wall had decreased in size since the prior examination.  There were no new or progressive findings.  There was improved CT appearance of the lungs. There was persistent patchy airspace opacities and scarring type changes but no discrete mass or nodule. Here was no mediastinal or hilar adenopathy. There was stable soft tissue density infiltrating the left kidney and left renal sinus and vascular pedicle region. There was stable bilateral renal calculi. There was no abdominal/pelvic lymphadenopathy or inguinal adenopathy. There was stable severe atherosclerotic  disease.    Appointment on 07/22/2019  Component Date Value Ref Range Status   Uric Acid, Serum 07/22/2019 8.5* 2.5 - 7.1 mg/dL Final   Performed at [facility], [address] [place], [place] [address]   LDH 07/22/2019 170  98 - 192 U/L Final   Performed at [facility], [address] [place], [place] [address]   Sodium 07/22/2019 137  135 - 145 mmol/L Final   Potassium 07/22/2019 4.2  3.5 - 5.1 mmol/L Final   Chloride 07/22/2019 103  98 - 111 mmol/L Final   CO2 07/22/2019 27  22 - 32 mmol/L Final   Glucose, Bld 07/22/2019 135* 70 - 99 mg/dL Final   BUN 07/22/2019 54* 8 - 23 mg/dL Final   Creatinine, Ser 07/22/2019 2.33* 0.44 - 1.00 mg/dL Final   Calcium 07/22/2019 8.5* 8.9 - 10.3 mg/dL Final   Total Protein 07/22/2019 7.3  6.5 - 8.1 g/dL Final   Albumin 07/22/2019 3.5  3.5 - 5.0 g/dL Final   AST 07/22/2019 19  15 - 41 U/L Final   ALT 07/22/2019 18  0 - 44 U/L Final   Alkaline Phosphatase 07/22/2019 48  38 - 126 U/L Final   Total Bilirubin 07/22/2019 0.2* 0.3 - 1.2 mg/dL Final   GFR calc non Af Amer 07/22/2019 18* >60 mL/min Final   GFR calc Af Amer 07/22/2019 21* >60 mL/min Final   Anion gap 07/22/2019 7  5 - 15 Final   Performed at [facility], [address], [place], [place] [address]   WBC 07/22/2019 4.7  4.0 - 10.5 K/uL Final   RBC 07/22/2019 5.02  3.87 - 5.11 MIL/uL Final   Hemoglobin 07/22/2019 12.9  12.0 - 15.0 g/dL Final   HCT 07/22/2019 42.8  36.0 - 46.0 % Final   MCV 07/22/2019 85.3  80.0 - 100.0 fL Final   MCH 07/22/2019 25.7* 26.0 - 34.0 pg Final   MCHC 07/22/2019 30.1  30.0 - 36.0 g/dL Final   RDW 07/22/2019 15.3  11.5 - 15.5 % Final   Platelets 07/22/2019 87* 150 - 400 K/uL Final   Comment: Immature Platelet Fraction may be clinically indicated, consider ordering this additional test [id number]    nRBC 07/22/2019 0.0  0.0 - 0.2 % Final   Neutrophils Relative % 07/22/2019 54  % Final    Neutro Abs 07/22/2019 2.5  1.7 - 7.7 K/uL  Final   Lymphocytes Relative 07/22/2019 31  % Final   Lymphs Abs 07/22/2019 1.5  0.7 - 4.0 K/uL Final   Monocytes Relative 07/22/2019 12  % Final   Monocytes Absolute 07/22/2019 0.6  0.1 - 1.0 K/uL Final   Eosinophils Relative 07/22/2019 2  % Final   Eosinophils Absolute 07/22/2019 0.1  0.0 - 0.5 K/uL Final   Basophils Relative 07/22/2019 1  % Final   Basophils Absolute 07/22/2019 0.0  0.0 - 0.1 K/uL Final   Immature Granulocytes 07/22/2019 0  % Final   Abs Immature Granulocytes 07/22/2019 0.01  0.00 - 0.07 K/uL Final   Performed at [facility], [address], [place], [place] [address]    Assessment:  MARIACELESTE HERRERA is a 85 y.o. 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 She presented with massive subcutaneous deposits, pulmonary nodules, involvement of left kidney, and bowel. Disease was unresponsive to Rituxan in 2014.   Chest, abdomen, and pelvis CT on 03/17/2016 revealed progression of diffuse lymphoma with subcutaneous deposits throughout the subcutaneous fat of the chest, abdomen,and pelvis. There were multiple nodular infiltrates throughout the lungs, likely due to lymphoma but could also represent infection or atypical infection. There was massive retroperitoneal lymphadenopathy with diffuse retroperitoneal, mesenteric, and pelvic lymphadenopathy. There was wall thickening and pelvic small bowel probably representing lymphomas involvement. There was probable direct invasion of the left kidney.  Biopsy of the dominant SQ nodal conglomeration around the right flank on 03/18/2016 revealed a persistent low-grade CD5 positive B-cell lymphoma.  Flow cytometry was positive for CD20, CD79a, CD5, CD138. Neoplastic cells were positive for kappa and cyclin D1.  CD 23 was negative.   Additional molecular markers are pending.  Bone marrow aspirate and biopsy on 03/18/2016 revealed no  evidence of lymphoma or plasma cell neoplasm (0.2% kappa monoclonal plasma cells by flow analysis).  Marrow was hypercellular for age (50-60%) with trilineage hematopoiesis and no increased blasts. There was mild to moderate widespread increase in reticulin fibers. There was no storage iron detected.  The following studies were abnormal:  Beta2-microglobulin was 9.6 (0.6-2.4).  Serum viscosity was 5.5 (1.6-1.9).  Uric acid was 11.1 on 03/19/2016 and 5.7 on 05/23/2016.     Normal studies included:  hepatitis B surface antigen, hepatitis B core antibody total, hepatitis C antibody, HIV testing, and LDH (123).  G6PD assay was 9.2 (4.6-13.5).   She has iron deficiency (ferritin 27; iron saturation 11%), B12 deficiency (147), and folate deficiency (5.9).  She has chronic renal insufficiency (BUN was 30-35, creatinine was 1.38 - 1.80 with a GFR 29-39 ml/min).  She began B12 injections on 03/28/2016 (last 01/30/2019).  She is on folic acid.  Folate was 77.5 on 05/09/2016, 42 on 03/13/2017, and 11.4 on 01/08/2018.   She received 1 week of chlorambucil with a tapering dose of prednisone (began 03/30/2016).  Cortisol was 9.5 on 05/17/2016.  She began ibrutinib on 04/26/2016.  She has mild thrombocytopenia (94,000).   SPEP has been followed:  5.0 gm/dL on 03/18/2016, 4.5 on 04/25/2016,  2.3 on 06/20/2016, 2.0 on 10/24/2016, 1.5 on 12/19/2016, 1.4 on 03/13/2017, 1.1 on 07/01/2017, 1.3 on 01/08/2018, 0.9 on 04/12/2018, and 1.0 on 07/06/2018.  IgM has been followed:  > 5850 on 03/18/2016, > 5850 on 04/25/2016, 4652 on 06/09/2016, 3719 on 07/18/2016, 3429 on 10/24/2016, 2821 on 12/19/2016, 2295 on 03/13/2017, 2500 on 06/30/2017, 1890 09/29/2017, 1769 on 01/08/2018, 1244 on 04/12/2018, 1509 on 07/06/2018, 1567 on 09/11/2018, 1281 on 01/16/2019, and 1094 on  05/01/2019.  Serum viscosity has been followed: 5.5 on 03/18/2016 and 1.6 on 09/11/2018.  Chest abdomen and pelvis CT on 07/12/2019 revealed stable left  breast lesion. The other subcutaneous lesions involving the right chest wall had decreased in size since the prior examination.  There were no new or progressive findings.  There was improved CT appearance of the lungs. There was persistent patchy airspace opacities and scarring type changes but no discrete mass or nodule. Here was no mediastinal or hilar adenopathy. There was stable soft tissue density infiltrating the left kidney and left renal sinus and vascular pedicle region. There was stable bilateral renal calculi. There was no abdominal/pelvic lymphadenopathy or inguinal adenopathy. There was stable severe atherosclerotic disease.   Symptomatically, she denies any complaint.  Exam reveals an irregular rhythm.  Creatinine is 2.33.  Plan: 1.   Labs today:  CBC with diff, CMP, LDH, uric acid, SPEP, IgM. 2.   Waldenstrm's macroglobulinemia  Clinically, she continues to do well on ibrutinib.  Exam continues to reveal no palpable adenopathy or SQ masses.  Review interval chest, abdomen, pelvic CT scan on 07/12/2019.   Images personally reviewed.  Agree with radiology interpretation.   Left breast lesion as well asSQ lesions in the right chest wall have decreased.   No new findings.  Discuss plan to continue current ibrutinib. 3.   Thrombocytopenia  Hematocrit 40.3, hemoglobin 12.6, platelets 91,000, WBC 4800 (ANC 2600) on 05/01/2019.  Hematocrit 42.8, hemoglobin 12.9, platelets 87,000, WBC 4700 (ANC 2500) today.  Continue to observe without intervention 4.   LEFT renal mass Imaging studies in 07/12/2019 revealed a stable soft tissue density infiltrating the left kidney and left renal sinus as well as vascular pedicle region.   Mass encasing the left kidney is c/w Waldenstrm's.   5.   Renal insufficiency             Creatinine 2.33 today.               Creatinine 1.31-1.74 last 4 months.             She has known left renal involvement with Waldenstrom's as well as  nephrolithiasis.   Imaging suggests stable disease.             Discuss follow-up with Dr Juleen China.  Continue to avoid IV contrast secondary to renal function.  Continue to monitor. 6.   Irregular rhythm             Pulse irregular in clinic.  Patient agreeable to evaluation in urgent care with EKG. 7.   Bilateral lower extremity edema  Edema has improved.  Minimal residual ankle edema today. 8.   B12 deficiency  Last B12 on 06/26/2019.  Vitamin B12 injection today and monthly x 6. 9.   Folate deficiency  Folate 16.4 on 05/01/2019.  Continue surveillance. 10.    Irregular rhythm  Patient sent to [facility] for EKG  re:  irregular rhythm. 11.   Contact Alyson Hollice Espy re: dosing of ibrutinib and renal function. 12.   Please schedule appt wit Dr Juleen China (established patient) ASAP re:  increased creatinine. 13.   RTC in 3 months for MD assessment and labs (CBC with diff, CMP, SPEP, IgM, LDH, uric acid).  Addendum:  EKG at Urgent Care revealed sinus bradycardia with AV dissociation and accelerated junctional beat and accelerated junctional rhythm rhythm with accelerated PVCs  I discussed the assessment and treatment plan with the patient.  The patient was provided an  opportunity to ask questions and all were answered.  The patient agreed with the plan and demonstrated an understanding of the instructions.  The patient was advised to call back if the symptoms worsen or if the condition fails to improve as anticipated.  I provided 26 minutes (1:33 PM - 1:59 PM) of face-to-face time during this this encounter and > 50% was spent counseling as documented under my assessment and plan.    Lequita Asal, MD, PhD    07/22/2019, 1:59 PM  I, Jacqualyn Posey, am acting as a Education administrator for [employer]. Mike Gip, MD.   I, Emalene Welte C. Mike Gip, MD, have reviewed the above documentation for accuracy and completeness, and I agree with the above.

## 2019-07-23 LAB — PROTEIN ELECTROPHORESIS, SERUM
A/G Ratio: 1 (ref 0.7–1.7)
Albumin ELP: 3.3 g/dL (ref 2.9–4.4)
Alpha-1-Globulin: 0.2 g/dL (ref 0.0–0.4)
Alpha-2-Globulin: 0.8 g/dL (ref 0.4–1.0)
Beta Globulin: 0.8 g/dL (ref 0.7–1.3)
Gamma Globulin: 1.5 g/dL (ref 0.4–1.8)
Globulin, Total: 3.3 g/dL (ref 2.2–3.9)
M-Spike, %: 0.7 g/dL — ABNORMAL HIGH
Total Protein ELP: 6.6 g/dL (ref 6.0–8.5)

## 2019-07-23 NOTE — Telephone Encounter (Signed)
Spoke to patient. Made her an appt for this Friday.   She asked about getting a ride to her appt. I told her I would look into it and give her a call back.

## 2019-07-24 ENCOUNTER — Inpatient Hospital Stay: Payer: Medicare HMO

## 2019-07-24 ENCOUNTER — Telehealth: Payer: Self-pay

## 2019-07-24 NOTE — Telephone Encounter (Signed)
Spoke with the patient to see if she was taking allopurinol , per dr Mike Gip the patient states yes she is. I have informed Dr Mike Gip.

## 2019-07-24 NOTE — Telephone Encounter (Signed)
Reached out to cancer center to see how patient normally gets to appts. She reported that this is a service for cancer center patients only.   I made call back to patient to see if she was able to find number for [facility] service so I could get her a ride. Number for medicare transportation is [phone]. I made call since it is less than 3 days (they usually require 3 business day notice). Since this is a referral from ED I went ahead and made call.   Lucianne Lei to pick up patient between 8:45 and 9:15 at her home and scheduled to pick her up from hospital at 11:15AM for return trip. Ref # [id number]  Returned call to patient to make her aware. She was appreciative of help.

## 2019-07-26 ENCOUNTER — Other Ambulatory Visit: Payer: Self-pay

## 2019-07-26 ENCOUNTER — Ambulatory Visit (INDEPENDENT_AMBULATORY_CARE_PROVIDER_SITE_OTHER): Payer: Medicare HMO | Admitting: Cardiology

## 2019-07-26 ENCOUNTER — Encounter: Payer: Self-pay | Admitting: Cardiology

## 2019-07-26 ENCOUNTER — Ambulatory Visit (INDEPENDENT_AMBULATORY_CARE_PROVIDER_SITE_OTHER): Payer: Medicare HMO

## 2019-07-26 VITALS — BP 160/80 | HR 68 | Ht 64.0 in | Wt 208.0 lb

## 2019-07-26 DIAGNOSIS — N289 Disorder of kidney and ureter, unspecified: Secondary | ICD-10-CM | POA: Diagnosis not present

## 2019-07-26 DIAGNOSIS — I493 Ventricular premature depolarization: Secondary | ICD-10-CM

## 2019-07-26 DIAGNOSIS — I10 Essential (primary) hypertension: Secondary | ICD-10-CM | POA: Diagnosis not present

## 2019-07-26 DIAGNOSIS — R600 Localized edema: Secondary | ICD-10-CM

## 2019-07-26 MED ORDER — HYDRALAZINE HCL 50 MG PO TABS: 50.0000 mg | ORAL_TABLET | Freq: Three times a day (TID) | ORAL | 5 refills | Status: DC

## 2019-07-26 NOTE — Patient Instructions (Signed)
Medication Instructions:  Your physician has recommended you make the following change in your medication:   1) STOP Amlodipine.  2) INCREASE Hydralazine to 50mg  three times daily. An Rx has been sent to your pharmacy.  *If you need a refill on your cardiac medications before your next appointment, please call your pharmacy*  Lab Work: None ordered If you have labs (blood work) drawn today and your tests are completely normal, you will receive your results only by: Marland Kitchen MyChart Message (if you have MyChart) OR . A paper copy in the mail If you have any lab test that is abnormal or we need to change your treatment, we will call you to review the results.  Testing/Procedures: Your physician has requested that you have an echocardiogram. Echocardiography is a painless test that uses sound waves to create images of your heart. It provides your doctor with information about the size and shape of your heart and how well your heart's chambers and valves are working. This procedure takes approximately one hour. There are no restrictions for this procedure.   Your physician has recommended that you wear an zio monitor. Zio monitors are medical devices that record the heart's electrical activity. Doctors most often [place] these monitors to diagnose arrhythmias. Arrhythmias are problems with the speed or rhythm of the heartbeat. The monitor is a small, portable device. You can wear one while you do your normal daily activities. This is usually used to diagnose what is causing palpitations/syncope (passing out).    Follow-Up: At [facility], you and your health needs are our priority.  As part of our continuing mission to provide you with exceptional heart care, we have created designated Provider Care Teams.  These Care Teams include your primary Cardiologist (physician) and Advanced Practice Providers (APPs -  Physician Assistants and Nurse Practitioners) who all work together to provide you with the care  you need, when you need it.  Your next appointment:   2-3 months  The format for your next appointment:   In Person  Provider:    You may see Dr. Mylo Red- Charlestine Night or one of the following Advanced Practice Providers on your designated Care Team:    Murray Hodgkins, NP  Christell Faith, PA-C  Marrianne Mood, PA-C   Other Instructions Your physician has recommended that you wear a Zio monitor. This monitor is a medical device that records the heart's electrical activity. Doctors most often use these monitors to diagnose arrhythmias. Arrhythmias are problems with the speed or rhythm of the heartbeat. The monitor is a small device applied to your chest. You can wear one while you do your normal daily activities. While wearing this monitor if you have any symptoms to push the button and record what you felt. Once you have worn this monitor for the period of time provider prescribed (Usually 14 days), you will return the monitor device in the postage paid box. Once it is returned they will download the data collected and provide [place] with a report which the provider will then review and we will call you with those results. Important tips:  1. Avoid showering during the first 24 hours of wearing the monitor. 2. Avoid excessive sweating to help maximize wear time. 3. Do not submerge the device, no hot tubs, and no swimming pools. 4. Keep any lotions or oils away from the patch. 5. After 24 hours you may shower with the patch on. Take brief showers with your back facing the shower head.  6. Do  not remove patch once it has been placed because that will interrupt data and decrease adhesive wear time. 7. Push the button when you have any symptoms and write down what you were feeling. 8. Once you have completed wearing your monitor, remove and place into box which has postage paid and place in your outgoing mailbox.  9. If for some reason you have misplaced your box then call our office and we can provide  another box and/or mail it off for you.

## 2019-07-26 NOTE — Progress Notes (Signed)
Cardiology Office Note:    Date:  07/26/2019   ID:  Kristy Solomon, DOB 10/22/1928, MRN [id number]  PCP:  Jodi Marble, MD  Cardiologist:  No primary care provider on file.  Electrophysiologist:  None   Referring MD: Jodi Marble, MD   Chief Complaint  Patient presents with  . New Patient (Initial Visit)    patient c/o swelling in both legs. Meds reviewed verbally with patient.     History of Present Illness:    Kristy Solomon is a 85 y.o. female with a hx of hypertension, hyperlipidemia, lymphoma, renal insufficiency who presents due to irregular heartbeat.  4 days ago, patient presented to the [facility] for heart regular checkup.  She has a history of lymphoma and was told it is in remission.  She follows up every 3 to 4 months at the cancer center.  Valuation revealed irregular pulse and patient was sent to the [facility] urgent care center.  EKG was performed which showed sinus bradycardia with frequent PVCs and occasional junctional rhythm.  Her lab work revealed a creatinine of 2.3 which is higher than her baseline.  She has a history of lower extremity edema and was taking Lasix.  Lasix benazepril were stopped and patient advised to follow-up with cardiology.  Denies any history of heart disease.  Able to walk around by herself with no chest pain or shortness of breath.  Had an echocardiogram last year which showed normal ejection fraction with EF 60 to 65% and grade 1 diastolic dysfunction.  She otherwise states feeling fine with no concerns or complaints at this time.  Past Medical History:  Diagnosis Date  . 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 Hyperlipidemia   . Hypertension   . 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 Renal insufficiency     Past Surgical History:  Procedure Laterality Date  . ABDOMINAL HYSTERECTOMY    . HEMORRHOID SURGERY      Current Medications: Current Meds  Medication Sig  . allopurinol (ZYLOPRIM) 100 MG  tablet Take 2 tablets (200 mg total) by mouth daily.  Marland Kitchen atorvastatin (LIPITOR) 20 MG tablet Take 20 mg by mouth daily.  . Calcium Citrate-Vitamin D 315-250 MG-UNIT TABS Take 1 tablet by mouth daily.   . colchicine 0.6 MG tablet Take 0.6 mg by mouth as needed (gout).   Marland Kitchen diclofenac sodium (VOLTAREN) 1 % GEL Apply 4 g topically 2 (two) times daily as needed.  . folic acid (FOLVITE) 1 MG tablet TAKE 1 TABLET BY MOUTH ONCE DAILY  . gabapentin (NEURONTIN) 300 MG capsule Take 300 mg by mouth 3 (three) times daily.   . hydrALAZINE (APRESOLINE) 50 MG tablet Take 1 tablet (50 mg total) by mouth 3 (three) times daily.  . IMBRUVICA 420 MG TABS TAKE 420 MG BY MOUTH DAILY.  Marland Kitchen omeprazole (PRILOSEC) 20 MG capsule TK 1 C PO D  . potassium chloride (MICRO-K) 10 MEQ CR capsule TK 1 C PO QAM  . [DISCONTINUED] amLODipine-benazepril (LOTREL) 5-20 MG capsule amlodipine 5 mg-benazepril 20 mg capsule  amLODIPine Besy-Benazepril HCl 5-20 MG Oral Capsule QTY: 90 capsule Days: 90 Refills: 1  Written: 09/21/18 Patient Instructions: 1 every morning  . [DISCONTINUED] hydrALAZINE (APRESOLINE) 25 MG tablet Take 1 tablet (25 mg total) by mouth 3 (three) times daily.     Allergies:   Patient has no known allergies.   Social History   Socioeconomic History  . Marital status: Widowed    Spouse name: Not on  file  . Number of children: Not on file  . Years of education: Not on file  . Highest education level: Not on file  Occupational History  . Not on file  Social Needs  . Financial resource strain: Not on file  . Food insecurity    Worry: Not on file    Inability: Not on file  . Transportation needs    Medical: Not on file    Non-medical: Not on file  Tobacco Use  . Smoking status: Never Smoker  . Smokeless tobacco: Never Used  Substance and Sexual Activity  . Alcohol use: No  . Drug use: No  . Sexual activity: Not on file  Lifestyle  . Physical activity    Days per week: Not on file    Minutes per  session: Not on file  . Stress: Not on file  Relationships  . Social Herbalist on phone: Not on file    Gets together: Not on file    Attends religious service: Not on file    Active member of club or organization: Not on file    Attends meetings of clubs or organizations: Not on file    Relationship status: Not on file  Other Topics Concern  . Not on file  Social History Narrative  . Not on file     Family History: The patient's family history includes Cancer in her brother and brother.  ROS:   Please see the history of present illness.     All other systems reviewed and are negative.  EKGs/Labs/Other Studies Reviewed:    The following studies were reviewed today: Echocardiogram 09/18/2018 Study Conclusions  - Left ventricle: The cavity size was normal. Wall thickness was   increased in a pattern of moderate to severe LVH. Systolic   function was normal. The estimated ejection fraction was in the   range of 60% to 65%. Wall motion was normal; there were no   regional wall motion abnormalities. Doppler parameters are   consistent with abnormal left ventricular relaxation (grade 1   diastolic dysfunction). Doppler parameters are consistent with   high ventricular filling pressure. - Left atrium: The atrium was moderately dilated. - Right ventricle: The cavity size was normal. Wall thickness was   normal. Systolic function was normal. - Right atrium: The atrium was mildly dilated. - Tricuspid valve: There was moderate regurgitation. - Pericardium, extracardiac: A trivial pericardial effusion was   identified.  EKG:  EKG is  ordered today.  The ekg ordered today demonstrates sinus bradycardia heart rate around 51, frequent PVCs, occasional PACs.  Recent Labs: 09/18/2018: Magnesium 1.9; TSH 2.132 07/22/2019: ALT 18; BUN 54; Creatinine, Ser 2.33; Hemoglobin 12.9; Platelets 87; Potassium 4.2; Sodium 137  Recent Lipid Panel No results found for: CHOL, TRIG,  HDL, CHOLHDL, VLDL, LDLCALC, LDLDIRECT  Physical Exam:    VS:  BP (!) 160/80 (BP Location: Right Arm, Patient Position: Sitting, Cuff Size: Normal)   Pulse 68   Ht 5\' 4"  (1.626 m)   Wt 208 lb (94.3 kg)   BMI 35.70 kg/m     Wt Readings from Last 3 Encounters:  07/26/19 208 lb (94.3 kg)  07/22/19 208 lb 8.9 oz (94.6 kg)  07/19/19 208 lb 8.9 oz (94.6 kg)     GEN:  Well nourished, well developed in no acute distress HEENT: Normal NECK: No JVD; No carotid bruits LYMPHATICS: No lymphadenopathy CARDIAC: Regular bradycardic, occasional skipped beats., no murmurs, rubs, gallops RESPIRATORY:  Clear  to auscultation without rales, wheezing or rhonchi  ABDOMEN: Soft, non-tender, non-distended MUSCULOSKELETAL:  1+ edema; No deformity  SKIN: Warm and dry NEUROLOGIC:  Alert and oriented x 3 PSYCHIATRIC:  Normal affect   ASSESSMENT:   EKG in the office reveals sinus bradycardia with heart rate 81, occasional PVCs.  Physical shows 1+ edema in the lower extremity.  Last creatinine was 2.3. 1. Frequent PVCs   2. Leg edema   3. Essential hypertension   4. Renal dysfunction    PLAN:    We will get a Zio patch to evaluate PVC burden.  Repeat echocardiogram in light of frequent PVCs to monitor ejection fraction or any new structural abnormalities.  We will stop amlodipine and benazepril as this will help with her lower extremity edema and renal dysfunction.  We will increase hydralazine dosage to 50 mg 3 times daily.  Patient states having a blood pressure machine at home.  I advised her to check her blood pressure frequently.  We will up after above tests.  Total encounter time more than 60 minutes  Greater than 50% was spent in counseling and coordination of care with the patient  This note was generated in part or whole with voice recognition software. Voice recognition is usually quite accurate but there are transcription errors that can and very often do occur. I apologize for any  typographical errors that were not detected and corrected.    Medication Adjustments/Labs and Tests Ordered: Current medicines are reviewed at length with the patient today.  Concerns regarding medicines are outlined above.  Orders Placed This Encounter  Procedures  . LONG TERM MONITOR (3-14 DAYS)  . EKG 12-Lead  . ECHOCARDIOGRAM COMPLETE   Meds ordered this encounter  Medications  . hydrALAZINE (APRESOLINE) 50 MG tablet    Sig: Take 1 tablet (50 mg total) by mouth 3 (three) times daily.    Dispense:  90 tablet    Refill:  5    Patient Instructions  Medication Instructions:  Your physician has recommended you make the following change in your medication:   1) STOP Amlodipine.  2) INCREASE Hydralazine to 50mg  three times daily. An Rx has been sent to your pharmacy.  *If you need a refill on your cardiac medications before your next appointment, please call your pharmacy*  Lab Work: None ordered If you have labs (blood work) drawn today and your tests are completely normal, you will receive your results only by: Marland Kitchen MyChart Message (if you have MyChart) OR . A paper copy in the mail If you have any lab test that is abnormal or we need to change your treatment, we will call you to review the results.  Testing/Procedures: Your physician has requested that you have an echocardiogram. Echocardiography is a painless test that uses sound waves to create images of your heart. It provides your doctor with information about the size and shape of your heart and how well your heart's chambers and valves are working. This procedure takes approximately one hour. There are no restrictions for this procedure.   Your physician has recommended that you wear an zio monitor. Zio monitors are medical devices that record the heart's electrical activity. Doctors most often [place] these monitors to diagnose arrhythmias. Arrhythmias are problems with the speed or rhythm of the heartbeat. The monitor is a  small, portable device. You can wear one while you do your normal daily activities. This is usually used to diagnose what is causing palpitations/syncope (passing out).  Follow-Up: At [facility], you and your health needs are our priority.  As part of our continuing mission to provide you with exceptional heart care, we have created designated Provider Care Teams.  These Care Teams include your primary Cardiologist (physician) and Advanced Practice Providers (APPs -  Physician Assistants and Nurse Practitioners) who all work together to provide you with the care you need, when you need it.  Your next appointment:   2-3 months  The format for your next appointment:   In Person  Provider:    You may see Dr. Mylo Red- Charlestine Night or one of the following Advanced Practice Providers on your designated Care Team:    Murray Hodgkins, NP  Christell Faith, PA-C  Marrianne Mood, PA-C   Other Instructions Your physician has recommended that you wear a Zio monitor. This monitor is a medical device that records the heart's electrical activity. Doctors most often use these monitors to diagnose arrhythmias. Arrhythmias are problems with the speed or rhythm of the heartbeat. The monitor is a small device applied to your chest. You can wear one while you do your normal daily activities. While wearing this monitor if you have any symptoms to push the button and record what you felt. Once you have worn this monitor for the period of time provider prescribed (Usually 14 days), you will return the monitor device in the postage paid box. Once it is returned they will download the data collected and provide [place] with a report which the provider will then review and we will call you with those results. Important tips:  1. Avoid showering during the first 24 hours of wearing the monitor. 2. Avoid excessive sweating to help maximize wear time. 3. Do not submerge the device, no hot tubs, and no swimming pools. 4. Keep  any lotions or oils away from the patch. 5. After 24 hours you may shower with the patch on. Take brief showers with your back facing the shower head.  6. Do not remove patch once it has been placed because that will interrupt data and decrease adhesive wear time. 7. Push the button when you have any symptoms and write down what you were feeling. 8. Once you have completed wearing your monitor, remove and place into box which has postage paid and place in your outgoing mailbox.  9. If for some reason you have misplaced your box then call our office and we can provide another box and/or mail it off for you.           Signed, Kate Sable, MD  07/26/2019 10:41 AM    [place]

## 2019-07-28 DIAGNOSIS — N289 Disorder of kidney and ureter, unspecified: Secondary | ICD-10-CM | POA: Insufficient documentation

## 2019-07-31 MED FILL — IMBRUVICA 420 MG TAB: 420 | 28 days supply | Qty: 28 | Fill #1

## 2019-08-07 DIAGNOSIS — I493 Ventricular premature depolarization: Secondary | ICD-10-CM | POA: Diagnosis not present

## 2019-08-16 ENCOUNTER — Other Ambulatory Visit: Payer: Self-pay

## 2019-08-19 ENCOUNTER — Inpatient Hospital Stay: Payer: Medicare HMO

## 2019-08-19 ENCOUNTER — Inpatient Hospital Stay: Payer: Medicare HMO | Attending: Hematology and Oncology

## 2019-08-19 DIAGNOSIS — E538 Deficiency of other specified B group vitamins: Secondary | ICD-10-CM | POA: Insufficient documentation

## 2019-08-20 ENCOUNTER — Inpatient Hospital Stay: Payer: Medicare HMO

## 2019-08-20 ENCOUNTER — Other Ambulatory Visit: Payer: Self-pay

## 2019-08-20 DIAGNOSIS — E538 Deficiency of other specified B group vitamins: Secondary | ICD-10-CM

## 2019-08-20 MED ORDER — CYANOCOBALAMIN 1000 MCG/ML IJ SOLN
1000.0000 ug | Freq: Once | INTRAMUSCULAR | Status: AC
  Administered 2019-08-20: 1000 ug via INTRAMUSCULAR
  Filled 2019-08-20: qty 1

## 2019-08-21 ENCOUNTER — Inpatient Hospital Stay: Payer: Medicare HMO

## 2019-08-26 MED FILL — IMBRUVICA 420 MG TAB: 420 | 28 days supply | Qty: 28 | Fill #2

## 2019-09-05 ENCOUNTER — Other Ambulatory Visit: Payer: Self-pay | Admitting: Cardiology

## 2019-09-05 DIAGNOSIS — I493 Ventricular premature depolarization: Secondary | ICD-10-CM

## 2019-09-05 DIAGNOSIS — R600 Localized edema: Secondary | ICD-10-CM

## 2019-09-18 ENCOUNTER — Inpatient Hospital Stay: Payer: Medicare HMO

## 2019-09-18 ENCOUNTER — Inpatient Hospital Stay: Payer: Medicare HMO | Attending: Hematology and Oncology

## 2019-09-18 ENCOUNTER — Other Ambulatory Visit: Payer: Self-pay

## 2019-09-18 DIAGNOSIS — E538 Deficiency of other specified B group vitamins: Secondary | ICD-10-CM | POA: Diagnosis not present

## 2019-09-18 MED ORDER — CYANOCOBALAMIN 1000 MCG/ML IJ SOLN
1000.0000 ug | Freq: Once | INTRAMUSCULAR | Status: AC
  Administered 2019-09-18: 1000 ug via INTRAMUSCULAR
  Filled 2019-09-18: qty 1

## 2019-09-19 ENCOUNTER — Other Ambulatory Visit: Payer: Self-pay | Admitting: Hematology and Oncology

## 2019-09-19 DIAGNOSIS — C8300 Small cell B-cell lymphoma, unspecified site: Secondary | ICD-10-CM

## 2019-09-20 ENCOUNTER — Other Ambulatory Visit: Payer: Medicare HMO

## 2019-09-24 MED FILL — IMBRUVICA 420 MG TAB: 420 | 28 days supply | Qty: 28 | Fill #0

## 2019-09-30 ENCOUNTER — Ambulatory Visit: Payer: Medicare HMO | Admitting: Cardiology

## 2019-10-02 ENCOUNTER — Other Ambulatory Visit: Payer: Self-pay | Admitting: Cardiology

## 2019-10-02 DIAGNOSIS — I493 Ventricular premature depolarization: Secondary | ICD-10-CM

## 2019-10-10 ENCOUNTER — Other Ambulatory Visit: Payer: Self-pay

## 2019-10-10 ENCOUNTER — Ambulatory Visit (INDEPENDENT_AMBULATORY_CARE_PROVIDER_SITE_OTHER): Payer: Medicare HMO

## 2019-10-10 DIAGNOSIS — I493 Ventricular premature depolarization: Secondary | ICD-10-CM | POA: Diagnosis not present

## 2019-10-10 DIAGNOSIS — I517 Cardiomegaly: Secondary | ICD-10-CM | POA: Diagnosis not present

## 2019-10-16 ENCOUNTER — Inpatient Hospital Stay: Payer: Medicare HMO

## 2019-10-18 ENCOUNTER — Other Ambulatory Visit: Payer: Self-pay

## 2019-10-18 ENCOUNTER — Ambulatory Visit (INDEPENDENT_AMBULATORY_CARE_PROVIDER_SITE_OTHER): Payer: Medicare HMO | Admitting: Cardiology

## 2019-10-18 ENCOUNTER — Encounter: Payer: Self-pay | Admitting: Cardiology

## 2019-10-18 VITALS — BP 149/102 | HR 107 | Ht 64.0 in | Wt 221.8 lb

## 2019-10-18 DIAGNOSIS — I5189 Other ill-defined heart diseases: Secondary | ICD-10-CM | POA: Diagnosis not present

## 2019-10-18 DIAGNOSIS — I10 Essential (primary) hypertension: Secondary | ICD-10-CM | POA: Diagnosis not present

## 2019-10-18 DIAGNOSIS — I4891 Unspecified atrial fibrillation: Secondary | ICD-10-CM

## 2019-10-18 DIAGNOSIS — I493 Ventricular premature depolarization: Secondary | ICD-10-CM

## 2019-10-18 DIAGNOSIS — R600 Localized edema: Secondary | ICD-10-CM

## 2019-10-18 DIAGNOSIS — Z79899 Other long term (current) drug therapy: Secondary | ICD-10-CM

## 2019-10-18 MED ORDER — APIXABAN 2.5 MG PO TABS: 2.5000 mg | ORAL_TABLET | Freq: Two times a day (BID) | ORAL | 6 refills | Status: DC

## 2019-10-18 MED ORDER — FUROSEMIDE 40 MG PO TABS: 40.0000 mg | ORAL_TABLET | Freq: Every day | ORAL | 6 refills | Status: DC

## 2019-10-18 MED ORDER — HYDRALAZINE HCL 50 MG PO TABS: 50.0000 mg | ORAL_TABLET | Freq: Three times a day (TID) | ORAL | 6 refills | Status: DC

## 2019-10-18 MED ORDER — METOPROLOL SUCCINATE ER 25 MG PO TB24: 25.0000 mg | ORAL_TABLET | Freq: Every day | ORAL | 6 refills | Status: DC

## 2019-10-18 NOTE — Progress Notes (Signed)
Cardiology Office Note:    Date:  10/18/2019   ID:  Kristy Solomon, DOB 1928/11/11, MRN [id number]  PCP:  Jodi Marble, MD  Cardiologist:  No primary care provider on file.  Electrophysiologist:  None   Referring MD: Jodi Marble, MD   Chief Complaint  Patient presents with  . other    2 month follow up and discuss results of Zio and Echo. Meds reviewed by the pt. verbally. Pt. c/o LE edema and some shortness of breath.     History of Present Illness:    Kristy Solomon is a 86 y.o. female with a hx of hypertension, hyperlipidemia, lymphoma, renal insufficiency who presents for follow-up.  She was last seen due to irregular heartbeat.  She presented to the [facility] for follow-up due to history of lymphoma.  ECG performed showed sinus bradycardia with frequent PVCs.  Lower extremity edema was noted and patient started taking Lasix but ran out.  Cardiac monitor and echocardiogram was ordered.  Patient presents for results.  Cardiac monitor showed frequent ventricular ectopies, 20% burden.  Echo shows grade 2 diastolic dysfunction  Past Medical History:  Diagnosis Date  . 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 Hyperlipidemia   . Hypertension   . 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 Renal insufficiency     Past Surgical History:  Procedure Laterality Date  . ABDOMINAL HYSTERECTOMY    . HEMORRHOID SURGERY      Current Medications: Current Meds  Medication Sig  . allopurinol (ZYLOPRIM) 100 MG tablet Take 2 tablets (200 mg total) by mouth daily.  Marland Kitchen atorvastatin (LIPITOR) 20 MG tablet Take 20 mg by mouth daily.  . Calcium Citrate-Vitamin D 315-250 MG-UNIT TABS Take 1 tablet by mouth daily.   . colchicine 0.6 MG tablet Take 0.6 mg by mouth as needed (gout).   Marland Kitchen diclofenac sodium (VOLTAREN) 1 % GEL Apply 4 g topically 2 (two) times daily as needed.  . folic acid (FOLVITE) 1 MG tablet TAKE 1 TABLET BY MOUTH ONCE DAILY  . gabapentin  (NEURONTIN) 300 MG capsule Take 300 mg by mouth 3 (three) times daily.   . hydrALAZINE (APRESOLINE) 50 MG tablet Take 1 tablet (50 mg total) by mouth 3 (three) times daily.  . IMBRUVICA 420 MG TABS TAKE 420 MG BY MOUTH DAILY.  Marland Kitchen omeprazole (PRILOSEC) 20 MG capsule TK 1 C PO D  . potassium chloride (MICRO-K) 10 MEQ CR capsule TK 1 C PO QAM  . [DISCONTINUED] hydrALAZINE (APRESOLINE) 50 MG tablet Take 1 tablet (50 mg total) by mouth 3 (three) times daily.     Allergies:   Patient has no known allergies.   Social History   Socioeconomic History  . Marital status: Widowed    Spouse name: Not on file  . Number of children: Not on file  . Years of education: Not on file  . Highest education level: Not on file  Occupational History  . Not on file  Tobacco Use  . Smoking status: Never Smoker  . Smokeless tobacco: Never Used  Substance and Sexual Activity  . Alcohol use: No  . Drug use: No  . Sexual activity: Not on file  Other Topics Concern  . Not on file  Social History Narrative  . Not on file   Social Determinants of Health   Financial Resource Strain:   . Difficulty of Paying Living Expenses: Not on file  Food Insecurity:   . Worried About [employer] of  Food in the Last Year: Not on file  . Ran Out of Food in the Last Year: Not on file  Transportation Needs:   . Lack of Transportation (Medical): Not on file  . Lack of Transportation (Non-Medical): Not on file  Physical Activity:   . Days of Exercise per Week: Not on file  . Minutes of Exercise per Session: Not on file  Stress:   . Feeling of Stress : Not on file  Social Connections:   . Frequency of Communication with Friends and Family: Not on file  . Frequency of Social Gatherings with Friends and Family: Not on file  . Attends Religious Services: Not on file  . Active Member of Clubs or Organizations: Not on file  . Attends Archivist Meetings: Not on file  . Marital Status: Not on file     Family  History: The patient's family history includes Cancer in her brother and brother.  ROS:   Please see the history of present illness.     All other systems reviewed and are negative.  EKGs/Labs/Other Studies Reviewed:    The following studies were reviewed today:  Cardiac monitor 07/26/2019 Patient had a min HR of 36 bpm, max HR of 171 bpm, and avg HR of 67 bpm. Predominant underlying rhythm was Sinus Rhythm. First Degree AV Block was present. occasional Supraventricular Tachycardia runs occurred, the run with the fastest interval lasting 4 beats with a max rate of 171 bpm, Isolated SVEs were frequent (5.6%, [id number]), SVE Couplets were rare (<1.0%, 4699),  and SVE Triplets were rare (<1.0%, 508). Isolated VEs were frequent (20.3%, [id number]), VE Couplets were rare (<1.0%, 2113), and VE Triplets were rare (<1.0%, 986).   TTE 10/10/2019  1. Left ventricular ejection fraction, by visual estimation, is 60 to 65%. The left ventricle has normal function. There is mild to moderately increased left ventricular hypertrophy.  2. The left ventricle has no regional wall motion abnormalities.  3. Left ventricular diastolic parameters are consistent with Grade II diastolic dysfunction (pseudonormalization).  4. Global right ventricle has normal systolic function.The right ventricular size is normal. No increase in right ventricular wall thickness.  5. Left atrial size was moderately dilated.  6. Normal pulmonary artery systolic pressure.  7. Frequent PVCs noted.  8. Trivial pericardial effusion is present.  EKG:  EKG is  ordered today.  The ekg ordered today demonstrates atrial fibrillation heart rate 107.  Occasional PVCs.  Recent Labs: 07/22/2019: ALT 18; BUN 54; Creatinine, Ser 2.33; Hemoglobin 12.9; Platelets 87; Potassium 4.2; Sodium 137  Recent Lipid Panel No results found for: CHOL, TRIG, HDL, CHOLHDL, VLDL, LDLCALC, LDLDIRECT  Physical Exam:    VS:  BP (!) 149/102 (BP Location: Right Arm,  Patient Position: Sitting, Cuff Size: Normal)   Pulse (!) 107   Ht 5\' 4"  (1.626 m)   Wt 221 lb 12 oz (100.6 kg)   SpO2 99%   BMI 38.06 kg/m     Wt Readings from Last 3 Encounters:  10/18/19 221 lb 12 oz (100.6 kg)  07/26/19 208 lb (94.3 kg)  07/22/19 208 lb 8.9 oz (94.6 kg)     GEN:  Well nourished, well developed in no acute distress HEENT: Normal NECK: No JVD; No carotid bruits LYMPHATICS: No lymphadenopathy CARDIAC: Regular bradycardic, occasional skipped beats., no murmurs, rubs, gallops RESPIRATORY:  Clear to auscultation without rales, wheezing or rhonchi  ABDOMEN: Soft, non-tender, non-distended MUSCULOSKELETAL:  1+ edema; No deformity  SKIN: Warm and dry NEUROLOGIC:  Alert and oriented x 3 PSYCHIATRIC:  Normal affect   ASSESSMENT:    1. Atrial fibrillation, unspecified type ([place])   2. Frequent PVCs   3. Essential hypertension   4. Diastolic dysfunction   5. Leg edema   6. Medication management    PLAN:     1.  A. fib noted on EKG today.  CHA2DS2-VASc score is 41 (age, htn, gender).  Start Toprol-XL 25 daily, Eliquis 2.5 mg twice daily (age and elevated Cr).  2.  frequent PVCs with 20% burden noted on cardiac monitor.  Echo shows normal ejection function with grade 2 diastolic dysfunction.  Start Toprol-XL 25 mg daily.  3.  Blood pressure is improved but still elevated.  Continue hydralazine 50 mg 3 times daily.  Start Toprol-XL 25 mg daily.  4.  Echocardiogram shows grade 2 diastolic dysfunction.  Diastolic dysfunction likely secondary to hypertension.  Management of blood pressure as above.  5.  Extremity edema possibly due to diastolic dysfunction.  Start Lasix 40 mg daily.  Check BMP in 2 weeks.  [place] up in 1 month  Total encounter time more than 45 minutes  Greater than 50% was spent in counseling and coordination of care with the patient  This note was generated in part or whole with voice recognition software. Voice recognition is usually quite  accurate but there are transcription errors that can and very often do occur. I apologize for any typographical errors that were not detected and corrected.    Medication Adjustments/Labs and Tests Ordered: Current medicines are reviewed at length with the patient today.  Concerns regarding medicines are outlined above.  Orders Placed This Encounter  Procedures  . Basic metabolic panel  . EKG 12-Lead   Meds ordered this encounter  Medications  . apixaban (ELIQUIS) 2.5 MG TABS tablet    Sig: Take 1 tablet (2.5 mg total) by mouth 2 (two) times daily.    Dispense:  60 tablet    Refill:  6  . metoprolol succinate (TOPROL-XL) 25 MG 24 hr tablet    Sig: Take 1 tablet (25 mg total) by mouth daily.    Dispense:  30 tablet    Refill:  6  . hydrALAZINE (APRESOLINE) 50 MG tablet    Sig: Take 1 tablet (50 mg total) by mouth 3 (three) times daily.    Dispense:  90 tablet    Refill:  6  . furosemide (LASIX) 40 MG tablet    Sig: Take 1 tablet (40 mg total) by mouth daily.    Dispense:  30 tablet    Refill:  6    Patient Instructions  Medication Instructions:  - Your physician has recommended you make the following change in your medication:   1) Start eliquis 2.5 mg- take 1 tablet (2.5 mg) by mouth twice a day (every 12 hours)  2) Start toprol xl (metoprolol succinate) 25 mg- take 1 tablet (25 mg) by mouth once a day  3) Start lasix (furosemide) 40 mg- take 1 tablet (40 mg) by mouth once a day  4) You should already be taking Hydralazine 50 mg- take 1 tablet (50 mg) by mouth three times a day  Please bring all of your medication bottles to your next appointment   *If you need a refill on your cardiac medications before your next appointment, please call your pharmacy*  Lab Work: - Your physician recommends that you return for lab work in: 2 weeks (around 11/01/19)- BMP  Come to the [facility] entrance at [facility], 1st desk on the right to check in Monday - Friday (7:30 am- 5:30  pm) You do not need to be fasting  If you have labs (blood work) drawn today and your tests are completely normal, you will receive your results only by: Marland Kitchen MyChart Message (if you have MyChart) OR . A paper copy in the mail If you have any lab test that is abnormal or we need to change your treatment, we will call you to review the results.  Testing/Procedures: - none ordered  Follow-Up: At [facility], you and your health needs are our priority.  As part of our continuing mission to provide you with exceptional heart care, we have created designated Provider Care Teams.  These Care Teams include your primary Cardiologist (physician) and Advanced Practice Providers (APPs -  Physician Assistants and Nurse Practitioners) who all work together to provide you with the care you need, when you need it.  Your next appointment:   1 month(s)  The format for your next appointment:   In Person  Provider:   Kate Sable, MD  Other Instructions n/a     Signed, Kate Sable, MD  10/18/2019 3:44 PM    [place]

## 2019-10-18 NOTE — Patient Instructions (Addendum)
Medication Instructions:  - Your physician has recommended you make the following change in your medication:   1) Start eliquis 2.5 mg- take 1 tablet (2.5 mg) by mouth twice a day (every 12 hours)  2) Start toprol xl (metoprolol succinate) 25 mg- take 1 tablet (25 mg) by mouth once a day  3) Start lasix (furosemide) 40 mg- take 1 tablet (40 mg) by mouth once a day  4) You should already be taking Hydralazine 50 mg- take 1 tablet (50 mg) by mouth three times a day  Please bring all of your medication bottles to your next appointment   *If you need a refill on your cardiac medications before your next appointment, please call your pharmacy*  Lab Work: - Your physician recommends that you return for lab work in: 2 weeks (around 11/01/19)- BMP  Come to the [place] entrance at [facility], 1st desk on the right to check in Monday - Friday (7:30 am- 5:30 pm) You do not need to be fasting  If you have labs (blood work) drawn today and your tests are completely normal, you will receive your results only by: Marland Kitchen MyChart Message (if you have MyChart) OR . A paper copy in the mail If you have any lab test that is abnormal or we need to change your treatment, we will call you to review the results.  Testing/Procedures: - none ordered  Follow-Up: At [facility], you and your health needs are our priority.  As part of our continuing mission to provide you with exceptional heart care, we have created designated Provider Care Teams.  These Care Teams include your primary Cardiologist (physician) and Advanced Practice Providers (APPs -  Physician Assistants and Nurse Practitioners) who all work together to provide you with the care you need, when you need it.  Your next appointment:   1 month(s)  The format for your next appointment:   In Person  Provider:   Kate Sable, MD  Other Instructions n/a

## 2019-10-19 NOTE — Progress Notes (Signed)
[facility]  [address], [address] [place], [place] [address] Phone: [phone]  Fax: [phone]   Clinic Day:  10/21/2019  Referring physician: Jodi Marble, MD  Chief Complaint: Kristy Solomon is a 86 y.o. female with lymphoplasmacytic lymphoma who is seen for 3 month assessment   HPI: The patient was last seen in the medical oncology clinic on 07/22/2019. At that time, she denied any complaint.  Exam revealed an irregular rhythm.  Creatinine was 2.33. Hematocrit was 42.8, hemoglobin 12.9, MCV 85.3, platelets 87,000, WBC 4700 with an ANC 2500.  M-spike was 0.7 gm/dl.  Uric acid was 8.1 (mildly elevated). LDH was 170.    She received monthly B12 (07/22/2019 - 09/18/2019).   She was seen for acute kidney injury at [facility] on 07/22/2019. EKG interpretation showed sinus bradycardia with a rate of 78 with frequent PVCs and fusion complexes.  She was to stop her benazepril and furosemide.  Echo on 10/10/2019 revealed an EF of 60-65%.  She was seen by Dr. Garen Lah in cardiology on 10/18/2019.  EKG in clinic revealed atrial fibrillation.  CHA2DS2-VASc score was 13 (age, HTN, gender).  She started Toprol-XL 25 mg daily and Eliquis 2.5 mg BID. She was to start Lasix 40 mg a day for lower extremity edema.  She has a follow up in 1 month.  During the interim, she's been doing the best she can with COVID going on. She denies any new symptoms or concerns.  Symptomatically, she is doing fine. She has had no gout flares. She is unable to wear shoes due to her chronic BLE edema.  She is still taking her ibrutinib without any complications.  She has had no issues receiving her medication in the mail.  She is aware that she has an irregular heart rhythm.   Past Medical History:  Diagnosis Date  . 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 Hyperlipidemia   . Hypertension   . Malignant lymphoma, lymphoplasmacytic ([place]) 12/27/2013  . Renal insufficiency      Past Surgical History:  Procedure Laterality Date  . ABDOMINAL HYSTERECTOMY    . HEMORRHOID SURGERY      Family History  Problem Relation Age of Onset  . Cancer Brother        throat ca  . Cancer Brother        bone cancer    Social History:  reports that she has never smoked. She has never used smokeless tobacco. She reports that she does not drink alcohol or use drugs. She lives in [place] with her grandson and his wife. Her emergency contact is Cecille Po, her grandaughter- [phone]). [name]'s phone number [phone]). She lives in [place]. She has transportation issues. She states that her grandson's wife can assist with travel (needs to be arranged). The patient is alone today.  Allergies: No Known Allergies  Current Medications: Current Outpatient Medications  Medication Sig Dispense Refill  . allopurinol (ZYLOPRIM) 100 MG tablet Take 2 tablets (200 mg total) by mouth daily. 180 tablet 1  . amLODipine-benazepril (LOTREL) 5-20 MG capsule Take 1 capsule by mouth daily.     Marland Kitchen apixaban (ELIQUIS) 2.5 MG TABS tablet Take 1 tablet (2.5 mg total) by mouth 2 (two) times daily. 60 tablet 6  . aspirin 81 MG EC tablet Take 81 mg by mouth daily.     Marland Kitchen atorvastatin (LIPITOR) 20 MG tablet Take 20 mg by mouth daily.    . Calcium Citrate-Vitamin D 315-250 MG-UNIT TABS  Take 1 tablet by mouth daily.     . colchicine 0.6 MG tablet Take 0.6 mg by mouth as needed (gout).   0  . diclofenac sodium (VOLTAREN) 1 % GEL Apply 4 g topically 2 (two) times daily as needed. 1 Tube 0  . folic acid (FOLVITE) 1 MG tablet TAKE 1 TABLET BY MOUTH ONCE DAILY 90 tablet 0  . furosemide (LASIX) 40 MG tablet Take 1 tablet (40 mg total) by mouth daily. 30 tablet 6  . gabapentin (NEURONTIN) 300 MG capsule Take 300 mg by mouth 3 (three) times daily.     . hydrALAZINE (APRESOLINE) 50 MG tablet Take 1 tablet (50 mg total) by mouth 3 (three) times daily. 90 tablet 6  . IMBRUVICA 420 MG TABS TAKE 420  MG BY MOUTH DAILY. 28 tablet 2  . metoprolol succinate (TOPROL-XL) 25 MG 24 hr tablet Take 1 tablet (25 mg total) by mouth daily. 30 tablet 6  . omeprazole (PRILOSEC) 20 MG capsule TK 1 C PO D    . potassium chloride (MICRO-K) 10 MEQ CR capsule TK 1 C PO QAM     No current facility-administered medications for this visit.   Facility-Administered Medications Ordered in Other Visits  Medication Dose Route Frequency Provider Last Rate Last Admin  . cyanocobalamin ((VITAMIN B-12)) injection 1,000 mcg  1,000 mcg Intramuscular Once Lequita Asal, MD        Review of Systems  Constitutional: Negative for chills, diaphoresis, fever, malaise/fatigue and weight loss (11 lbs due to edema).       Feels "fine".  No concerns.  HENT: Negative.  Negative for congestion, ear discharge, ear pain, nosebleeds, sinus pain, sore throat and tinnitus.   Eyes: Negative.  Negative for blurred vision, double vision, photophobia, pain, discharge and redness.  Respiratory: Negative.  Negative for cough, hemoptysis, sputum production and shortness of breath.   Cardiovascular: Positive for leg swelling (BLE; cannot wear shoes). Negative for chest pain, palpitations, orthopnea, claudication and PND.  Gastrointestinal: Negative.  Negative for abdominal pain, blood in stool, constipation, diarrhea, heartburn, melena, nausea and vomiting.  Genitourinary: Negative for dysuria, frequency, hematuria and urgency.       H/o nephrolithiasis. No hematuria.  LEFT renal mass.   Musculoskeletal: Negative.  Negative for back pain, falls, joint pain, myalgias and neck pain.       H/o gout.  No recent flares.  Skin: Negative.  Negative for itching and rash.       No SQ nodules.  Neurological: Negative.  Negative for dizziness, tingling, tremors, sensory change, speech change, focal weakness, weakness and headaches.  Endo/Heme/Allergies: Negative.  Does not bruise/bleed easily.  Psychiatric/Behavioral: Negative.  Negative for  depression and memory loss. The patient is not nervous/anxious and does not have insomnia.   All other systems reviewed and are negative.  Performance status (ECOG):  0  Vitals Blood pressure (!) 158/92, pulse (!) 118, temperature 98.3 F (36.8 C), temperature source Tympanic, resp. rate 18, height 5\' 4"  (1.626 m), weight 219 lb 2.2 oz (99.4 kg), SpO2 100 %.   Physical Exam  Constitutional: She is oriented to person, place, and time. She appears well-developed and well-nourished. No distress. Face mask in place.  HENT:  Head: Normocephalic and atraumatic.  Mouth/Throat: Oropharynx is clear and moist. No oropharyngeal exudate.  Black cap.  Shoulder-length gray hair. Mask.  Eyes: Pupils are equal, round, and reactive to light. Conjunctivae and EOM are normal. No scleral icterus.  Glasses.  Brown eyes.  Neck: No JVD present.  Cardiovascular: An irregular rhythm present. Tachycardia present.  No murmur heard. Pulmonary/Chest: Effort normal and breath sounds normal. No respiratory distress. She has no wheezes.  Abdominal: Soft. Bowel sounds are normal. She exhibits no distension. There is no abdominal tenderness.  Musculoskeletal:        General: Tenderness and edema (chronic bilateral lower extremity R > L, 2+) present. Normal range of motion.     Cervical back: Normal range of motion and neck supple.  Lymphadenopathy:       Head (right side): No preauricular, no posterior auricular and no occipital adenopathy present.       Head (left side): No preauricular, no posterior auricular and no occipital adenopathy present.    She has no cervical adenopathy.    She has no axillary adenopathy.       Right: No inguinal and no supraclavicular adenopathy present.       Left: No inguinal and no supraclavicular adenopathy present.  Neurological: She is alert and oriented to person, place, and time.  Skin: Skin is warm and dry. No rash noted. She is not diaphoretic. No erythema. No pallor.  No  palpable SQ masses.  Psychiatric: She has a normal mood and affect. Her behavior is normal. Judgment and thought content normal.  Nursing note and vitals reviewed.   Imaging studies: 03/17/2016:  Chest, abdomen, and pelvis CTrevealed progression of diffuse lymphoma with subcutaneous deposits throughout the subcutaneous fat of the chest, abdomen,and pelvis. There were multiple nodular infiltrates throughout the lungs, likely due to lymphoma but could also represent infection or atypical infection. There was massive retroperitoneal lymphadenopathy with diffuse retroperitoneal, mesenteric, and pelvic lymphadenopathy. There was wall thickening and pelvic small bowel probably representing lymphomas involvement. There was probable direct invasion of the left kidney. 04/21/2016:  Chest, abdomen, and pelvis CT revealed a partial response to therapy. There were bilateral pulmonary nodules/masses, measuring up to 5.6 cm in the right lower lobe (improved). There was trace left pleural effusion. There was a 12.7 cm left renal/perirenal mass (decreased) with associated mild left hydronephrosis. Retroperitoneal/left pelvic lymphadenopathy was mildly decreased. Multifocal subcutaneous nodules, measuring up to 4.5 cm in the left lower anterior abdominal wall were stable to mildly improved. 12/28/2017:  Abdomen and pelvis CTwas concerning for small bowel obstruction with transitional zone at the distal ileum. There was no associated mass. LEFT renal massmeasured 6.1 x 5.0 cm (previously 13.7 x 10.7 cm). Suspect combination of mass and adenopathy due to lymphoma in the left adrenal. This area was much less pronounced than on prior study. There was a 1.7 x 1.2 cm lymph node in the RIGHT external iliac node chain. Subcutaneous deposits bilaterally were considerably smaller compared to 2017. There were sizable calculi(2.0 x 1.7 cm, 2.6 x 2.0 cm) in each kidney without obstruction. 07/11/2018:  Chest,  abdomen, and pelvis CTrevealed a hypodense 3.6 cm inferior LEFT thyroid nodule (stable), new subpleural medial 0.8 cm apical RUL nodule, new subpleural anterior 0.5 cm LUL nodule, basilar LLL mass measuring 4.6 x 3.3 cm (previously 4.2 x 3.2 cm) and a basilar RLL nodule measuring 1.4 cm (previously 0.6 cm). The LEFT upper breast mass measuring 2.4 x 2.0 cm (previously 3.8 x 2.1 cm). There were superficial RIGHT chest wall masses measuring up to 2.3 x 0.9 cm (previously 4.1 x 1.6 cm). There were several bulky stones in the RIGHT renal pelvis measuring up to 22 x 11 mm, and a non-obstructing 13 mm LEFT renal stone. There was  a coarsely calcified 2.3 cm anterior interpolar RIGHT renal cortical lesion, and a simple 1.8 cm anterior RIGHT renal cyst. The infiltrative soft tissue density, replacing the LEFT renal sinus and partially encasing the kidney/vascular pedicle, spanned up to 10.2 x 6.5 cm.  07/12/2019:  Chest abdomen and pelvis CT revealed stable left breast lesion. The other subcutaneous lesions involving the right chest wall had decreased in size since the prior examination.  There were no new or progressive findings.  There was improved CT appearance of the lungs. There was persistent patchy airspace opacities and scarring type changes but no discrete mass or nodule. Here was no mediastinal or hilar adenopathy. There was stable soft tissue density infiltrating the left kidney and left renal sinus and vascular pedicle region. There was stable bilateral renal calculi. There was no abdominal/pelvic lymphadenopathy or inguinal adenopathy. There was stable severe atherosclerotic disease.    Appointment on 10/21/2019  Component Date Value Ref Range Status  . Uric Acid, Serum 10/21/2019 7.3* 2.5 - 7.1 mg/dL Final   Performed at [facility], [address], [place], [place] [address]  . LDH 10/21/2019 160  98 - 192 U/L Final   Performed at [facility], [address],  [place], [place] [address]  . Sodium 10/21/2019 135  135 - 145 mmol/L Final  . Potassium 10/21/2019 3.9  3.5 - 5.1 mmol/L Final  . Chloride 10/21/2019 106  98 - 111 mmol/L Final  . CO2 10/21/2019 22  22 - 32 mmol/L Final  . Glucose, Bld 10/21/2019 99  70 - 99 mg/dL Final  . BUN 10/21/2019 28* 8 - 23 mg/dL Final  . Creatinine, Ser 10/21/2019 1.68* 0.44 - 1.00 mg/dL Final  . Calcium 10/21/2019 8.6* 8.9 - 10.3 mg/dL Final  . Total Protein 10/21/2019 7.1  6.5 - 8.1 g/dL Final  . Albumin 10/21/2019 3.4* 3.5 - 5.0 g/dL Final  . AST 10/21/2019 17  15 - 41 U/L Final  . ALT 10/21/2019 22  0 - 44 U/L Final  . Alkaline Phosphatase 10/21/2019 45  38 - 126 U/L Final  . Total Bilirubin 10/21/2019 0.7  0.3 - 1.2 mg/dL Final  . GFR calc non Af Amer 10/21/2019 27* >60 mL/min Final  . GFR calc Af Amer 10/21/2019 31* >60 mL/min Final  . Anion gap 10/21/2019 7  5 - 15 Final   Performed at [facility], [address] [place], [place] [address]    Assessment:  Kristy Solomon is a 86 y.o. 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 She presented with massive subcutaneous deposits, pulmonary nodules, involvement of left kidney, and bowel. Disease was unresponsive toRituxanin 2014.   Chest, abdomen, and pelvis CTon 03/17/2016 revealed progression of diffuse lymphoma with subcutaneous deposits throughout the subcutaneous fat of the chest, abdomen,and pelvis. There were multiple nodular infiltrates throughout the lungs, likely due to lymphoma but could also represent infection or atypical infection. There was massive retroperitoneal lymphadenopathy with diffuse retroperitoneal, mesenteric, and pelvic lymphadenopathy. There was wall thickening and pelvic small bowel probably representing lymphomas involvement. There was probable direct invasion of the left kidney.  Biopsyof the dominantSQ nodal conglomerationaround the right flank on 03/18/2016 revealed a  persistent low-grade CD5 positive B-cell lymphoma. Flow cytometry was positive for CD20, CD79a, CD5, CD138. Neoplastic cells were positive for kappa and cyclin D1. CD 23 was negative. Additional molecular markers are pending.  Bone marrowaspirate and biopsy on 03/18/2016 revealed no evidence of lymphoma or plasma cell  neoplasm (0.2% kappa monoclonal plasma cells by flow analysis). Marrow was hypercellular for age (50-60%) with trilineage hematopoiesis and no increased blasts. There was mild to moderate widespread increase in reticulin fibers. There was no storage iron detected.  The following studies were abnormal: Beta2-microglobulin was 9.6 (0.6-2.4). Serum viscosity was 5.5 (1.6-1.9). Uric acid was 11.1 on 03/19/2016 and 5.7 on 05/23/2016.   Normalstudies included: hepatitis B surface antigen, hepatitis B core antibody total, hepatitis C antibody, HIV testing, and LDH (123). G6PD assay was 9.2 (4.6-13.5).   She has iron deficiency(ferritin 27; iron saturation 11%),B12 deficiency (147), and folate deficiency(5.9). She has chronic renal insufficiency (BUN was 30-35, creatinine was 1.38 - 1.80 with a GFR 29-39 ml/min). She began B12 injectionson 03/28/2016 (last 01/30/2019). She is on folic acid. Folate was 77.5 on 05/09/2016, 42 on 03/13/2017, and 11.4 on 01/08/2018.  She received 1 week of chlorambucilwith a tapering dose of prednisone(began 03/30/2016). Cortisol was 9.5 on 05/17/2016. She began ibrutinib on 04/26/2016. She has mild thrombocytopenia (94,000).   SPEPhas been followed: 5.0 gm/dL on 03/18/2016, 4.5 on 04/25/2016, 2.3 on 06/20/2016, 2.0 on 10/24/2016, 1.5 on 12/19/2016, 1.4 on 03/13/2017, 1.1 on 07/01/2017, 1.3 on 01/08/2018, 0.9 on 04/12/2018, and 1.0 on 07/06/2018.  IgM has been followed: >5850 on 03/18/2016, >5850 on 04/25/2016, 4652 on 06/09/2016, 3719 on 07/18/2016, 3429 on 10/24/2016, 2821 on 12/19/2016, 2295 on 03/13/2017, 2500 on  06/30/2017, 1890 09/29/2017, 1769 on 01/08/2018, 1244 on 04/12/2018, 1509 on 07/06/2018, 1567 on 09/11/2018, 1281 on 01/16/2019, and 1094 on 05/01/2019.  Serum viscosityhas been followed: 5.5 on 03/18/2016 and 1.6 on 09/11/2018.  Chest abdomen and pelvis CT on 07/12/2019 revealed stable left breast lesion. The other subcutaneous lesions involving the right chest wall had decreased in size since the prior examination.  There were no new or progressive findings.  There was improved CT appearance of the lungs. There was persistent patchy airspace opacities and scarring type changes but no discrete mass or nodule. Here was no mediastinal or hilar adenopathy. There was stable soft tissue density infiltrating the left kidney and left renal sinus and vascular pedicle region. There was stable bilateral renal calculi. There was no abdominal/pelvic lymphadenopathy or inguinal adenopathy. There was stable severe atherosclerotic disease.   She has atrial fibrillation.  CHA2DS2-VASc score is 53 (age, HTN, gender).  She is on Eliquis 2.5 mg BID.  Symptomatically, she denies any complaints.  Plan: 1.   Labs today:CBC with diff, CMP, LDH, uric acid, SPEP, IgM. 2.   Waldenstrm's macroglobulinemia       Clinically, she is doing well on ibrutinib.       Exam reveals nopalpable adenopathy or SQ masses.       Chest, abdomen, pelvic CT scan on 07/12/2019 revealed the left breast lesion as well as the SQ lesions in the right chest wall have decreased.                   No new findings.       Continue ibrutinib. 3.   Thrombocytopenia       Hematocrit 40.3, hemoglobin 12.6, platelets 91,000, WBC 4800 (ANC 2600) on 05/01/2019.       Hematocrit 42.8, hemoglobin 12.9, platelets 87,000, WBC 4700 ([place] 2500) on 07/22/2019.        Hematocrit 44.0, hemoglobin 13.9, platelets 89,000, WBC 3700 (ANC 2400) on 10/21/2019.      Continue to monitor. 4.   LEFT renal mass Imaging studies in 07/12/2019 revealed a stable soft tissue  density  infiltrating the left kidney and left renal sinus as well as vascular pedicle region.   Mass encasing the left kidney is c/w  Waldenstrm's. Continue to monitor.  5.Renal insufficiency Creatinine 1.68 today (improved).  Creatinine 1.31-1.74 last 4 months. She has known left renal involvement with Waldenstrom's as well as nephrolithiasis.                         Imaging suggests stable disease. Patient is followed by Dr Juleen China.             Avoid IV contrast secondary to renal function.             Continue to monitor. 6. Atrial fibrillation Patient aware of irregular rhythm.  Nurse to recheck BP and pulse.  At last visit on 07/22/2019, patient sent to Urgent Care.   EKG revealed sinus bradycardia with AV dissociation and accelerated junctional rhythm.             Patient seen by Dr Garen Lah, cardiologist, on 10/18/2019.   Vitals included BP 149/102 and pulse 107.   Atrial fibrillation noted on EKG.   Patient started on Toprol-XL and Eliquis.   Lasix started for lower extremity edema.  Discuss need to follow-up with cardiology. 7.   B12 deficiency             Last B12 on 09/18/2019.             B12 today and monthly x 6. 8.   Folate deficiency             Folate 16.4 on 05/01/2019.             Check folate annually. 9.   RTC in 3 months for MD assessment and labs (CBC with diff, CMP, SPEP, IgM, LDH, uric acid).  I discussed the assessment and treatment plan with the patient.  The patient was provided an opportunity to ask questions and all were answered.  The patient agreed with the plan and demonstrated an understanding of the instructions.  The patient was advised to call back if the symptoms worsen or if the condition fails to improve as anticipated.  I provided 20 minutes (1:38 PM - 1:57 PM) of face-to-face time during this this encounter and > 50% was spent counseling as documented under my assessment and  plan.    Lequita Asal, MD, PhD    10/21/2019, 1:57 PM  I, Samul Dada, am acting as a scribe for Lequita Asal, MD.  I, McDuffie Mike Gip, MD, have reviewed the above documentation for accuracy and completeness, and I agree with the above.

## 2019-10-21 ENCOUNTER — Inpatient Hospital Stay: Payer: Medicare HMO | Attending: Hematology and Oncology | Admitting: Hematology and Oncology

## 2019-10-21 ENCOUNTER — Telehealth: Payer: Self-pay

## 2019-10-21 ENCOUNTER — Telehealth: Payer: Self-pay | Admitting: Cardiology

## 2019-10-21 ENCOUNTER — Inpatient Hospital Stay: Payer: Medicare HMO

## 2019-10-21 ENCOUNTER — Other Ambulatory Visit: Payer: Self-pay

## 2019-10-21 ENCOUNTER — Encounter: Payer: Self-pay | Admitting: Hematology and Oncology

## 2019-10-21 VITALS — BP 158/92 | HR 118 | Temp 98.3°F | Resp 18 | Ht 64.0 in | Wt 219.1 lb

## 2019-10-21 DIAGNOSIS — Z7901 Long term (current) use of anticoagulants: Secondary | ICD-10-CM | POA: Diagnosis not present

## 2019-10-21 DIAGNOSIS — D509 Iron deficiency anemia, unspecified: Secondary | ICD-10-CM | POA: Insufficient documentation

## 2019-10-21 DIAGNOSIS — Z9221 Personal history of antineoplastic chemotherapy: Secondary | ICD-10-CM | POA: Diagnosis not present

## 2019-10-21 DIAGNOSIS — N189 Chronic kidney disease, unspecified: Secondary | ICD-10-CM | POA: Insufficient documentation

## 2019-10-21 DIAGNOSIS — I10 Essential (primary) hypertension: Secondary | ICD-10-CM | POA: Diagnosis not present

## 2019-10-21 DIAGNOSIS — R001 Bradycardia, unspecified: Secondary | ICD-10-CM | POA: Insufficient documentation

## 2019-10-21 DIAGNOSIS — D696 Thrombocytopenia, unspecified: Secondary | ICD-10-CM | POA: Diagnosis not present

## 2019-10-21 DIAGNOSIS — C8300 Small cell B-cell lymphoma, unspecified site: Secondary | ICD-10-CM

## 2019-10-21 DIAGNOSIS — Z7982 Long term (current) use of aspirin: Secondary | ICD-10-CM | POA: Diagnosis not present

## 2019-10-21 DIAGNOSIS — N178 Other acute kidney failure: Secondary | ICD-10-CM | POA: Diagnosis not present

## 2019-10-21 DIAGNOSIS — I4891 Unspecified atrial fibrillation: Secondary | ICD-10-CM | POA: Insufficient documentation

## 2019-10-21 DIAGNOSIS — C880 Waldenstrom macroglobulinemia: Secondary | ICD-10-CM | POA: Diagnosis not present

## 2019-10-21 DIAGNOSIS — E538 Deficiency of other specified B group vitamins: Secondary | ICD-10-CM | POA: Diagnosis not present

## 2019-10-21 DIAGNOSIS — R600 Localized edema: Secondary | ICD-10-CM | POA: Diagnosis not present

## 2019-10-21 DIAGNOSIS — R918 Other nonspecific abnormal finding of lung field: Secondary | ICD-10-CM | POA: Diagnosis not present

## 2019-10-21 DIAGNOSIS — Z79899 Other long term (current) drug therapy: Secondary | ICD-10-CM | POA: Insufficient documentation

## 2019-10-21 DIAGNOSIS — C8510 Unspecified B-cell lymphoma, unspecified site: Secondary | ICD-10-CM | POA: Insufficient documentation

## 2019-10-21 DIAGNOSIS — Z791 Long term (current) use of non-steroidal anti-inflammatories (NSAID): Secondary | ICD-10-CM | POA: Insufficient documentation

## 2019-10-21 LAB — CBC WITH DIFFERENTIAL/PLATELET
Abs Immature Granulocytes: 0.05 10*3/uL (ref 0.00–0.07)
Basophils Absolute: 0 10*3/uL (ref 0.0–0.1)
Basophils Relative: 1 %
Eosinophils Absolute: 0 10*3/uL (ref 0.0–0.5)
Eosinophils Relative: 0 %
HCT: 44 % (ref 36.0–46.0)
Hemoglobin: 13.9 g/dL (ref 12.0–15.0)
Immature Granulocytes: 1 %
Lymphocytes Relative: 19 %
Lymphs Abs: 0.7 10*3/uL (ref 0.7–4.0)
MCH: 26.3 pg (ref 26.0–34.0)
MCHC: 31.6 g/dL (ref 30.0–36.0)
MCV: 83.2 fL (ref 80.0–100.0)
Monocytes Absolute: 0.5 10*3/uL (ref 0.1–1.0)
Monocytes Relative: 13 %
Neutro Abs: 2.4 10*3/uL (ref 1.7–7.7)
Neutrophils Relative %: 66 %
Platelets: 89 10*3/uL — ABNORMAL LOW (ref 150–400)
RBC: 5.29 MIL/uL — ABNORMAL HIGH (ref 3.87–5.11)
RDW: 16.2 % — ABNORMAL HIGH (ref 11.5–15.5)
WBC: 3.7 10*3/uL — ABNORMAL LOW (ref 4.0–10.5)
nRBC: 0 % (ref 0.0–0.2)

## 2019-10-21 LAB — COMPREHENSIVE METABOLIC PANEL
ALT: 22 U/L (ref 0–44)
AST: 17 U/L (ref 15–41)
Albumin: 3.4 g/dL — ABNORMAL LOW (ref 3.5–5.0)
Alkaline Phosphatase: 45 U/L (ref 38–126)
Anion gap: 7 (ref 5–15)
BUN: 28 mg/dL — ABNORMAL HIGH (ref 8–23)
CO2: 22 mmol/L (ref 22–32)
Calcium: 8.6 mg/dL — ABNORMAL LOW (ref 8.9–10.3)
Chloride: 106 mmol/L (ref 98–111)
Creatinine, Ser: 1.68 mg/dL — ABNORMAL HIGH (ref 0.44–1.00)
GFR calc Af Amer: 31 mL/min — ABNORMAL LOW (ref >60)
GFR calc non Af Amer: 27 mL/min — ABNORMAL LOW (ref >60)
Glucose, Bld: 99 mg/dL (ref 70–99)
Potassium: 3.9 mmol/L (ref 3.5–5.1)
Sodium: 135 mmol/L (ref 135–145)
Total Bilirubin: 0.7 mg/dL (ref 0.3–1.2)
Total Protein: 7.1 g/dL (ref 6.5–8.1)

## 2019-10-21 LAB — URIC ACID: Uric Acid, Serum: 7.3 mg/dL — ABNORMAL HIGH (ref 2.5–7.1)

## 2019-10-21 LAB — LACTATE DEHYDROGENASE: LDH: 160 U/L (ref 98–192)

## 2019-10-21 MED ORDER — METOPROLOL SUCCINATE ER 50 MG PO TB24: 50.0000 mg | ORAL_TABLET | Freq: Every day | ORAL | 4 refills | Status: DC

## 2019-10-21 NOTE — Progress Notes (Signed)
No new changes noted today 

## 2019-10-21 NOTE — Addendum Note (Signed)
Addended by: Vanessa Ralphs on: 10/21/2019 05:05 PM   Modules accepted: Orders

## 2019-10-21 NOTE — Telephone Encounter (Signed)
Received incoming call from [facility] at Dr Corcoran's office concerning patient's BP and HR today. Upon arrival to appointment BP was 158/92, HR 118. Patient denies any chest pain, shortness of breath, dizziness, palpitations. Later in the visit, the BP decreased to 160/72, HR 82.  Patient was seen last on 10/18/19 and med changes were made at this time. Verified with Loma Sousa who said patient said she was taking as prescribed.  Advised her to have patient continue to monitor at home, call us if continues to run greater than 140/90, and to keep follow up appointment as scheduled on 11/1519. Routing to Dr. Garen Lah for review and any further advice at this time.

## 2019-10-21 NOTE — Telephone Encounter (Signed)
spoke with the Cardiology office to inform Dr Garen Lah that this patient b/p and p was high in office today.first reading 158/92 p 118 2 nd reading 160/72 p 82. She do report the patient was just seen in the office on Friday and b/p was high at that time and she was noted to be tachycardia. The patient was informed that we needed to contact the cardiology office for further instruction. I also spoke with Anderson Malta she doe verify that the patient was just seen on Friday 10/18/2019. She does report that she will send a note to her provider which is not in the office today. They will reach out to the patient and have her to continue to monitor her b/p and keep a log of the blood pressure. I have informed the patient she was understanding.

## 2019-10-21 NOTE — Telephone Encounter (Signed)
Courtney from [facility] calling with elevated VS today   158/92 HR 118  160/75  HR 82   Please call to discuss care plan .

## 2019-10-21 NOTE — Telephone Encounter (Signed)
Kristy Sable, MD  You 15 minutes ago (4:45 PM)   Increase Toprol-XL to 50 mg daily. Continue other hypertensive meds as prescribed. Keep follow-up appointment next month.   Routing comment    Called patient and she verbalized understanding to increase Toprol XL to 50 mg daily. Rx sent to pharmacy.

## 2019-10-22 LAB — PROTEIN ELECTROPHORESIS, SERUM
A/G Ratio: 1 (ref 0.7–1.7)
Albumin ELP: 3.3 g/dL (ref 2.9–4.4)
Alpha-1-Globulin: 0.2 g/dL (ref 0.0–0.4)
Alpha-2-Globulin: 0.6 g/dL (ref 0.4–1.0)
Beta Globulin: 0.9 g/dL (ref 0.7–1.3)
Gamma Globulin: 1.5 g/dL (ref 0.4–1.8)
Globulin, Total: 3.2 g/dL (ref 2.2–3.9)
M-Spike, %: 0.8 g/dL — ABNORMAL HIGH
Total Protein ELP: 6.5 g/dL (ref 6.0–8.5)

## 2019-10-22 LAB — IGM: IgM (Immunoglobulin M), Srm: 1088 mg/dL — ABNORMAL HIGH (ref 26–217)

## 2019-11-04 ENCOUNTER — Other Ambulatory Visit: Payer: Self-pay

## 2019-11-04 DIAGNOSIS — C8300 Small cell B-cell lymphoma, unspecified site: Secondary | ICD-10-CM

## 2019-11-04 MED ORDER — IMBRUVICA 420 MG PO TABS: 420.0000 mg | ORAL_TABLET | Freq: Every day | ORAL | 2 refills | Status: DC

## 2019-11-06 ENCOUNTER — Other Ambulatory Visit: Payer: Self-pay

## 2019-11-06 DIAGNOSIS — C8300 Small cell B-cell lymphoma, unspecified site: Secondary | ICD-10-CM

## 2019-11-06 MED ORDER — IMBRUVICA 420 MG PO TABS: 420.0000 mg | ORAL_TABLET | Freq: Every day | ORAL | 2 refills | Status: DC

## 2019-11-06 MED FILL — IMBRUVICA 420 MG TAB: 420 | 28 days supply | Qty: 28 | Fill #1

## 2019-11-18 ENCOUNTER — Encounter: Payer: Self-pay | Admitting: Cardiology

## 2019-11-18 ENCOUNTER — Ambulatory Visit (INDEPENDENT_AMBULATORY_CARE_PROVIDER_SITE_OTHER): Payer: Medicare HMO | Admitting: Cardiology

## 2019-11-18 ENCOUNTER — Other Ambulatory Visit: Payer: Self-pay

## 2019-11-18 VITALS — BP 130/80 | HR 114 | Ht 64.0 in | Wt 231.0 lb

## 2019-11-18 DIAGNOSIS — I10 Essential (primary) hypertension: Secondary | ICD-10-CM

## 2019-11-18 DIAGNOSIS — R600 Localized edema: Secondary | ICD-10-CM

## 2019-11-18 DIAGNOSIS — I5189 Other ill-defined heart diseases: Secondary | ICD-10-CM | POA: Diagnosis not present

## 2019-11-18 DIAGNOSIS — I4891 Unspecified atrial fibrillation: Secondary | ICD-10-CM

## 2019-11-18 MED ORDER — TORSEMIDE 20 MG PO TABS: 40.0000 mg | ORAL_TABLET | Freq: Every day | ORAL | 1 refills | Status: DC

## 2019-11-18 NOTE — Progress Notes (Signed)
Cardiology Office Note:    Date:  11/18/2019   ID:  Kristy Solomon, DOB Dec 23, 1928, MRN [id number]  PCP:  Jodi Marble, MD  Cardiologist:  No primary care provider on file.  Electrophysiologist:  None   Referring MD: Jodi Marble, MD   Chief Complaint  Patient presents with  . other    1 month follow up. Pt. c/o LE edema and has some shortness of breath with over exertion.     History of Present Illness:    Kristy Solomon is a 86 y.o. female with a hx of hypertension, atrial fibrillation on Eliquis, hyperlipidemia, lymphoma, renal insufficiency who presents for follow-up.    She was last seen due to irregular heartbeat.  She presented to the [facility] for follow-up due to history of lymphoma.  ECG performed showed sinus bradycardia with frequent PVCs.  Lower extremity edema was noted and patient started taking Lasix but ran out.  Cardiac monitor and echocardiogram was ordered.  Patient presents for results.  Cardiac monitor showed frequent ventricular ectopies, 20% burden.  Echo shows normal ejection fraction, grade 2 diastolic dysfunction.  Lower extremity edema noted on last visit.  Lasix 40 mg daily was started but patient states her edema has worsened..  Last EKG in the office showed atrial fibrillation.  Patient started on Toprol-XL and Eliquis.  Past Medical History:  Diagnosis Date  . 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 Hyperlipidemia   . Hypertension   . 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 Renal insufficiency     Past Surgical History:  Procedure Laterality Date  . ABDOMINAL HYSTERECTOMY    . HEMORRHOID SURGERY      Current Medications: Current Meds  Medication Sig  . allopurinol (ZYLOPRIM) 100 MG tablet Take 2 tablets (200 mg total) by mouth daily.  Marland Kitchen amLODipine-benazepril (LOTREL) 5-20 MG capsule Take 1 capsule by mouth daily.   Marland Kitchen apixaban (ELIQUIS) 2.5 MG TABS tablet Take 1 tablet (2.5 mg total) by mouth 2 (two)  times daily.  Marland Kitchen aspirin 81 MG EC tablet Take 81 mg by mouth daily.   Marland Kitchen atorvastatin (LIPITOR) 20 MG tablet Take 20 mg by mouth daily.  . Calcium Citrate-Vitamin D 315-250 MG-UNIT TABS Take 1 tablet by mouth daily.   . colchicine 0.6 MG tablet Take 0.6 mg by mouth as needed (gout).   Marland Kitchen diclofenac sodium (VOLTAREN) 1 % GEL Apply 4 g topically 2 (two) times daily as needed.  . folic acid (FOLVITE) 1 MG tablet TAKE 1 TABLET BY MOUTH ONCE DAILY  . gabapentin (NEURONTIN) 300 MG capsule Take 300 mg by mouth 3 (three) times daily.   . hydrALAZINE (APRESOLINE) 50 MG tablet Take 1 tablet (50 mg total) by mouth 3 (three) times daily.  . Ibrutinib (IMBRUVICA) 420 MG TABS Take 420 mg by mouth daily.  . metoprolol succinate (TOPROL-XL) 50 MG 24 hr tablet Take 1 tablet (50 mg total) by mouth daily.  Marland Kitchen omeprazole (PRILOSEC) 20 MG capsule TK 1 C PO D  . potassium chloride (MICRO-K) 10 MEQ CR capsule TK 1 C PO QAM  . [DISCONTINUED] furosemide (LASIX) 40 MG tablet Take 1 tablet (40 mg total) by mouth daily.     Allergies:   Patient has no known allergies.   Social History   Socioeconomic History  . Marital status: Widowed    Spouse name: Not on file  . Number of children: Not on file  . Years of education: Not on file  .  Highest education level: Not on file  Occupational History  . Not on file  Tobacco Use  . Smoking status: Never Smoker  . Smokeless tobacco: Never Used  Substance and Sexual Activity  . Alcohol use: No  . Drug use: No  . Sexual activity: Not on file  Other Topics Concern  . Not on file  Social History Narrative  . Not on file   Social Determinants of Health   Financial Resource Strain:   . Difficulty of Paying Living Expenses: Not on file  Food Insecurity:   . Worried About Charity fundraiser in the Last Year: Not on file  . Ran Out of Food in the Last Year: Not on file  Transportation Needs:   . Lack of Transportation (Medical): Not on file  . Lack of Transportation  (Non-Medical): Not on file  Physical Activity:   . Days of Exercise per Week: Not on file  . Minutes of Exercise per Session: Not on file  Stress:   . Feeling of Stress : Not on file  Social Connections:   . Frequency of Communication with Friends and Family: Not on file  . Frequency of Social Gatherings with Friends and Family: Not on file  . Attends Religious Services: Not on file  . Active Member of Clubs or Organizations: Not on file  . Attends Archivist Meetings: Not on file  . Marital Status: Not on file     Family History: The patient's family history includes Cancer in her brother and brother.  ROS:   Please see the history of present illness.     All other systems reviewed and are negative.  EKGs/Labs/Other Studies Reviewed:    The following studies were reviewed today:  Cardiac monitor 07/26/2019 Patient had a min HR of 36 bpm, max HR of 171 bpm, and avg HR of 67 bpm. Predominant underlying rhythm was Sinus Rhythm. First Degree AV Block was present. occasional Supraventricular Tachycardia runs occurred, the run with the fastest interval lasting 4 beats with a max rate of 171 bpm, Isolated SVEs were frequent (5.6%, [id number]), SVE Couplets were rare (<1.0%, 4699),  and SVE Triplets were rare (<1.0%, 508). Isolated VEs were frequent (20.3%, [id number]), VE Couplets were rare (<1.0%, 2113), and VE Triplets were rare (<1.0%, 986).   TTE 10/10/2019  1. Left ventricular ejection fraction, by visual estimation, is 60 to 65%. The left ventricle has normal function. There is mild to moderately increased left ventricular hypertrophy.  2. The left ventricle has no regional wall motion abnormalities.  3. Left ventricular diastolic parameters are consistent with Grade II diastolic dysfunction (pseudonormalization).  4. Global right ventricle has normal systolic function.The right ventricular size is normal. No increase in right ventricular wall thickness.  5. Left atrial size  was moderately dilated.  6. Normal pulmonary artery systolic pressure.  7. Frequent PVCs noted.  8. Trivial pericardial effusion is present.  EKG:  EKG is  ordered today.  The ekg ordered today demonstrates atrial fibrillation heart rate 114.  Occasional PVCs.  Recent Labs: 10/21/2019: ALT 22; BUN 28; Creatinine, Ser 1.68; Hemoglobin 13.9; Platelets 89; Potassium 3.9; Sodium 135  Recent Lipid Panel No results found for: CHOL, TRIG, HDL, CHOLHDL, VLDL, LDLCALC, LDLDIRECT  Physical Exam:    VS:  BP 130/80 (BP Location: Right Arm, Patient Position: Sitting, Cuff Size: Normal)   Pulse (!) 114   Ht 5\' 4"  (1.626 m)   Wt 231 lb (104.8 kg)   BMI 39.65 kg/m  Wt Readings from Last 3 Encounters:  11/18/19 231 lb (104.8 kg)  10/21/19 219 lb 2.2 oz (99.4 kg)  10/18/19 221 lb 12 oz (100.6 kg)     GEN:  Well nourished, well developed in no acute distress HEENT: Normal NECK: No JVD; No carotid bruits LYMPHATICS: No lymphadenopathy CARDIAC: Regular bradycardic, occasional skipped beats., no murmurs, rubs, gallops RESPIRATORY:  Clear to auscultation without rales, wheezing or rhonchi  ABDOMEN: Soft, non-tender, distended MUSCULOSKELETAL:  2+ edema; No deformity  SKIN: Warm and dry NEUROLOGIC:  Alert and oriented x 3 PSYCHIATRIC:  Normal affect   ASSESSMENT:    1. Atrial fibrillation, unspecified type ([place])   2. Essential hypertension   3. Diastolic dysfunction   4. Leg edema    PLAN:     1.  Persistent atrial fibrillation..  CHA2DS2-VASc score is 36 (age, htn, gender).  Continue Toprol-XL 25 daily, Eliquis 2.5 mg twice daily (age and elevated Cr).  If heart rate stays above 110 on follow-up visit, plan to increase Toprol-XL dose.  Hold for now while diuresing.  2.  Blood pressure is controlled.  Continue hydralazine 50 mg 3 times daily, Toprol-XL 25 mg daily.  3.  Echocardiogram shows grade 2 diastolic dysfunction.  Diastolic dysfunction likely secondary to hypertension.   Management of blood pressure as above.  4.  Lower extremity edema possibly due to diastolic dysfunction.  Patient not adequately diuresed on Lasix.  Likely due to poor absorption.  Stop Lasix, start torsemide 40 mg daily.  Patient encouraged to check her weight daily.  If there is increase in daily weights over 3 pounds, or weekly weights over 5 pounds, patient advised to take an extra dose of torsemide..   Follow-up in 1 week.  Total encounter time more than 45 minutes  Greater than 50% was spent in counseling and coordination of care with the patient  This note was generated in part or whole with voice recognition software. Voice recognition is usually quite accurate but there are transcription errors that can and very often do occur. I apologize for any typographical errors that were not detected and corrected.     Medication Adjustments/Labs and Tests Ordered: Current medicines are reviewed at length with the patient today.  Concerns regarding medicines are outlined above.  Orders Placed This Encounter  Procedures  . EKG 12-Lead   Meds ordered this encounter  Medications  . torsemide (DEMADEX) 20 MG tablet    Sig: Take 2 tablets (40 mg total) by mouth daily.    Dispense:  60 tablet    Refill:  1    Patient Instructions  Medication Instructions:  Your physician has recommended you make the following change in your medication:   1) STOP Furosemide (Lasix)  2) START Torsemide 2 tablets (40 mg) daily. An Rx has been sent to your pharmacy.   *If you need a refill on your cardiac medications before your next appointment, please call your pharmacy*  Lab Work: None ordered If you have labs (blood work) drawn today and your tests are completely normal, you will receive your results only by: Marland Kitchen MyChart Message (if you have MyChart) OR . A paper copy in the mail If you have any lab test that is abnormal or we need to change your treatment, we will call you to review the  results.  Testing/Procedures: None ordered  Follow-Up: At [facility], you and your health needs are our priority.  As part of our continuing mission to provide you with exceptional  heart care, we have created designated Provider Care Teams.  These Care Teams include your primary Cardiologist (physician) and Advanced Practice Providers (APPs -  Physician Assistants and Nurse Practitioners) who all work together to provide you with the care you need, when you need it.  Your next appointment:   1 week  The format for your next appointment:   In Person  Provider:   Kate Sable, MD  Other Instructions N/A     Signed, Kate Sable, MD  11/18/2019 4:33 PM    [place]

## 2019-11-18 NOTE — Patient Instructions (Signed)
Medication Instructions:  Your physician has recommended you make the following change in your medication:   1) STOP Furosemide (Lasix)  2) START Torsemide 2 tablets (40 mg) daily. An Rx has been sent to your pharmacy.   *If you need a refill on your cardiac medications before your next appointment, please call your pharmacy*  Lab Work: None ordered If you have labs (blood work) drawn today and your tests are completely normal, you will receive your results only by: Marland Kitchen MyChart Message (if you have MyChart) OR . A paper copy in the mail If you have any lab test that is abnormal or we need to change your treatment, we will call you to review the results.  Testing/Procedures: None ordered  Follow-Up: At [facility], you and your health needs are our priority.  As part of our continuing mission to provide you with exceptional heart care, we have created designated Provider Care Teams.  These Care Teams include your primary Cardiologist (physician) and Advanced Practice Providers (APPs -  Physician Assistants and Nurse Practitioners) who all work together to provide you with the care you need, when you need it.  Your next appointment:   1 week  The format for your next appointment:   In Person  Provider:   Kate Sable, MD  Other Instructions N/A

## 2019-11-19 ENCOUNTER — Inpatient Hospital Stay: Payer: Medicare HMO | Attending: Hematology and Oncology

## 2019-11-19 ENCOUNTER — Other Ambulatory Visit: Payer: Self-pay

## 2019-11-19 DIAGNOSIS — E538 Deficiency of other specified B group vitamins: Secondary | ICD-10-CM

## 2019-11-19 MED ORDER — CYANOCOBALAMIN 1000 MCG/ML IJ SOLN
1000.0000 ug | Freq: Once | INTRAMUSCULAR | Status: AC
  Administered 2019-11-19: 14:00:00 1000 ug via INTRAMUSCULAR
  Filled 2019-11-19: qty 1

## 2019-11-25 ENCOUNTER — Ambulatory Visit: Payer: Medicare HMO | Admitting: Cardiology

## 2019-11-27 ENCOUNTER — Encounter: Payer: Self-pay | Admitting: Cardiology

## 2019-12-02 MED FILL — IMBRUVICA 420 MG TAB: 420 | 28 days supply | Qty: 28 | Fill #2

## 2019-12-16 ENCOUNTER — Inpatient Hospital Stay: Payer: Medicare HMO | Attending: Hematology and Oncology

## 2019-12-16 ENCOUNTER — Encounter: Payer: Self-pay | Admitting: Emergency Medicine

## 2019-12-16 ENCOUNTER — Other Ambulatory Visit: Payer: Self-pay

## 2019-12-16 ENCOUNTER — Inpatient Hospital Stay: Payer: Medicare HMO

## 2019-12-16 ENCOUNTER — Emergency Department
Admission: EM | Admit: 2019-12-16 | Discharge: 2019-12-16 | Disposition: A | Discharge status: Home / Self Care | Payer: Medicare HMO | Attending: Emergency Medicine | Admitting: Emergency Medicine

## 2019-12-16 DIAGNOSIS — E538 Deficiency of other specified B group vitamins: Secondary | ICD-10-CM | POA: Insufficient documentation

## 2019-12-16 DIAGNOSIS — Z79899 Other long term (current) drug therapy: Secondary | ICD-10-CM | POA: Diagnosis not present

## 2019-12-16 DIAGNOSIS — I10 Essential (primary) hypertension: Secondary | ICD-10-CM | POA: Insufficient documentation

## 2019-12-16 DIAGNOSIS — R319 Hematuria, unspecified: Secondary | ICD-10-CM | POA: Diagnosis present

## 2019-12-16 DIAGNOSIS — Z7982 Long term (current) use of aspirin: Secondary | ICD-10-CM | POA: Insufficient documentation

## 2019-12-16 DIAGNOSIS — Z7901 Long term (current) use of anticoagulants: Secondary | ICD-10-CM | POA: Diagnosis not present

## 2019-12-16 LAB — CBC
HCT: 46.4 % — ABNORMAL HIGH (ref 36.0–46.0)
Hemoglobin: 14.1 g/dL (ref 12.0–15.0)
MCH: 25.6 pg — ABNORMAL LOW (ref 26.0–34.0)
MCHC: 30.4 g/dL (ref 30.0–36.0)
MCV: 84.2 fL (ref 80.0–100.0)
Platelets: 98 10*3/uL — ABNORMAL LOW (ref 150–400)
RBC: 5.51 MIL/uL — ABNORMAL HIGH (ref 3.87–5.11)
RDW: 15.9 % — ABNORMAL HIGH (ref 11.5–15.5)
WBC: 5.7 10*3/uL (ref 4.0–10.5)
nRBC: 0 % (ref 0.0–0.2)

## 2019-12-16 LAB — URINALYSIS, COMPLETE (UACMP) WITH MICROSCOPIC
Bacteria, UA: NONE SEEN
Bilirubin Urine: NEGATIVE
Glucose, UA: NEGATIVE mg/dL
Ketones, ur: NEGATIVE mg/dL
Nitrite: NEGATIVE
Protein, ur: 30 mg/dL — AB
Specific Gravity, Urine: 1.006 (ref 1.005–1.030)
pH: 6 (ref 5.0–8.0)

## 2019-12-16 LAB — BASIC METABOLIC PANEL
Anion gap: 10 (ref 5–15)
BUN: 36 mg/dL — ABNORMAL HIGH (ref 8–23)
CO2: 28 mmol/L (ref 22–32)
Calcium: 8.7 mg/dL — ABNORMAL LOW (ref 8.9–10.3)
Chloride: 101 mmol/L (ref 98–111)
Creatinine, Ser: 2.13 mg/dL — ABNORMAL HIGH (ref 0.44–1.00)
GFR calc Af Amer: 23 mL/min — ABNORMAL LOW (ref >60)
GFR calc non Af Amer: 20 mL/min — ABNORMAL LOW (ref >60)
Glucose, Bld: 94 mg/dL (ref 70–99)
Potassium: 3.9 mmol/L (ref 3.5–5.1)
Sodium: 139 mmol/L (ref 135–145)

## 2019-12-16 MED ORDER — CYANOCOBALAMIN 1000 MCG/ML IJ SOLN
1000.0000 ug | Freq: Once | INTRAMUSCULAR | Status: AC
  Administered 2019-12-16: 1000 ug via INTRAMUSCULAR
  Filled 2019-12-16: qty 1

## 2019-12-16 NOTE — ED Notes (Signed)
EDP Archie Balboa and this RN at bedside.

## 2019-12-16 NOTE — ED Provider Notes (Signed)
[facility] Emergency Department Provider Note   ____________________________________________   I have reviewed the triage vital signs and the nursing notes.   HISTORY  Chief Complaint Hematuria   History limited by: Not Limited   HPI Kristy Solomon is a 86 y.o. female who presents to the emergency department today because of concern for hematuria. The patient states that she has noticed it for the past 3-4 days. She does not notice blood every time. She denies any burning or discomfort with urination. Denies any bad odor to her urination. States she thinks it might be related to the eliquis she took a few months ago. Denies any trauma to her stomach.    Records reviewed. Per medical record review patient has a history of renal insufficiency, atrial fibrillation.  Past Medical History:  Diagnosis Date  . 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 Hyperlipidemia   . Hypertension   . 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 Renal insufficiency     Patient Active Problem List   Diagnosis Date Noted  . Renal insufficiency 07/28/2019  . Bilateral edema of lower extremity 05/07/2019  . HLD (hyperlipidemia) 09/18/2018  . HTN (hypertension) 09/18/2018  . GERD (gastroesophageal reflux disease) 09/18/2018  . Back pain 09/18/2018  . Bradycardia 09/18/2018  . Bigeminy 09/18/2018  . Nephrolithiasis 01/11/2018  . Goals of care, counseling/discussion 12/28/2017  . Gastroenteritis 12/28/2017  . B12 deficiency 03/18/2016  . Folate deficiency 03/18/2016  . Iron deficiency anemia 03/18/2016  . Pneumonia 03/17/2016  . Pressure ulcer 03/17/2016  . Thrombocytopenia ([place]) 07/04/2014  . Acute renal failure ([place]) 07/04/2014  . Hyperuricemia 07/04/2014  . 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 Calculi, ureter 07/03/2012  . Frank hematuria 07/03/2012  . Hydronephrosis 07/03/2012  .  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 Calculus of kidney 07/02/2012  . Nonspecific finding on examination of urine 07/02/2012    Past Surgical History:  Procedure Laterality Date  . ABDOMINAL HYSTERECTOMY    . HEMORRHOID SURGERY      Prior to Admission medications   Medication Sig Start Date End Date Taking? Authorizing Provider  allopurinol (ZYLOPRIM) 100 MG tablet Take 2 tablets (200 mg total) by mouth daily. 09/11/18   Karen Kitchens, NP  amLODipine-benazepril (LOTREL) 5-20 MG capsule Take 1 capsule by mouth daily.  10/08/19   [provider]  apixaban (ELIQUIS) 2.5 MG TABS tablet Take 1 tablet (2.5 mg total) by mouth 2 (two) times daily. 10/18/19   Kate Sable, MD  aspirin 81 MG EC tablet Take 81 mg by mouth daily.  06/27/12   [provider]  atorvastatin (LIPITOR) 20 MG tablet Take 20 mg by mouth daily. 12/24/13   [provider]  Calcium Citrate-Vitamin D 315-250 MG-UNIT TABS Take 1 tablet by mouth daily.  06/27/12   [provider]  colchicine 0.6 MG tablet Take 0.6 mg by mouth as needed (gout).     [provider]  diclofenac sodium (VOLTAREN) 1 % GEL Apply 4 g topically 2 (two) times daily as needed. 09/15/18   Johnn Hai, PA-C  folic acid (FOLVITE) 1 MG tablet TAKE 1 TABLET BY MOUTH ONCE DAILY 07/03/18   Lequita Asal, MD  gabapentin (NEURONTIN) 300 MG capsule Take 300 mg by mouth 3 (three) times daily.     [provider]  hydrALAZINE (APRESOLINE) 50 MG tablet Take 1 tablet (50 mg total) by mouth 3 (three) times daily. 10/18/19 10/17/20  Kate Sable, MD  Ibrutinib (IMBRUVICA) 420 MG TABS Take 420 mg by mouth daily. 11/06/19   Lequita Asal, MD  metoprolol succinate (TOPROL-XL) 50 MG 24 hr tablet Take 1 tablet (50 mg total) by mouth daily. 10/21/19   Kate Sable, MD  omeprazole (PRILOSEC) 20 MG capsule TK 1 C PO D 04/15/19   [provider]  potassium chloride (MICRO-K) 10 MEQ CR capsule TK 1 C PO QAM 04/25/19   [provider]  torsemide (DEMADEX) 20 MG tablet Take 2 tablets (40 mg total) by mouth daily. 11/18/19   Kate Sable, MD    Allergies Eliquis [apixaban]  Family History  Problem Relation Age of Onset  . Cancer Brother        throat ca  . Cancer Brother        bone cancer    Social History Social History   Tobacco Use  . Smoking status: Never Smoker  . Smokeless tobacco: Never Used  Substance Use Topics  . Alcohol use: No  . Drug use: No    Review of Systems Constitutional: No fever/chills Eyes: No visual changes. ENT: No sore throat. Cardiovascular: Denies chest pain. Respiratory: Denies shortness of breath. Gastrointestinal: No abdominal pain.  No nausea, no vomiting.  No diarrhea.   Genitourinary: Positive for hematuria.  Musculoskeletal: Negative for back pain. Skin: Negative for rash. Neurological: Negative for headaches, focal weakness or numbness.  ____________________________________________   PHYSICAL EXAM:  VITAL SIGNS: ED Triage Vitals  Enc Vitals Group     BP 12/16/19 1410 (!) 175/77     Pulse Rate 12/16/19 1410 (!) 101     Resp 12/16/19 1410 17     Temp 12/16/19 1410 98.4 F (36.9 C)     Temp Source 12/16/19 1410 Oral     SpO2 12/16/19 1410 99 %     Weight 12/16/19 1411 189 lb (85.7 kg)     Height 12/16/19 1411 5\' 4"  (1.626 m)     Head Circumference --      Peak Flow --      Pain Score 12/16/19 1419 0   Constitutional: Alert and oriented.  Eyes: Conjunctivae are normal.  ENT      Head: Normocephalic and atraumatic.      Nose: No congestion/rhinnorhea.      Mouth/Throat: Mucous membranes are moist.      Neck: No stridor. Hematological/Lymphatic/Immunilogical: No cervical lymphadenopathy. Cardiovascular: Normal rate, irregular rhythm.  No murmurs, rubs, or  gallops.  Respiratory: Normal respiratory effort without tachypnea nor retractions. Breath sounds are clear and equal bilaterally. No wheezes/rales/rhonchi. Gastrointestinal: Soft and non tender. No rebound. No guarding.  Genitourinary: Deferred Musculoskeletal: Normal range of motion in all extremities. No lower extremity edema. Neurologic:  Normal speech and language. No gross focal neurologic deficits are appreciated.  Skin:  Skin is warm, dry and intact. No rash noted. Psychiatric: Mood and affect are normal. Speech and behavior are normal. Patient exhibits appropriate insight and judgment.  ____________________________________________    LABS (pertinent positives/negatives)  CBC wbc 5.7, hgb 14.1, plt 98 BMP na 139, k 3.9, cl 101, cr 2.13 UA clear, small hgb dipstick, protein 30, small leukocytes, 11-20 rbc, 6-10 wbc  ____________________________________________   EKG  None  ____________________________________________    RADIOLOGY  None  ____________________________________________   PROCEDURES  Procedures  ____________________________________________   INITIAL IMPRESSION /  ASSESSMENT AND PLAN / ED COURSE  Pertinent labs & imaging results that were available during my care of the patient were reviewed by me and considered in my medical decision making (see chart for details).   Patient presented to the emergency department today because of concern for bloody urine that she has noticed over the past 3-4 days. UA here does show blood. Not convincing for infection. Given lack of clinical symptoms for infection will plan on sending culture prior to treatment. Elevated creatinine however patient has history of the same. No anemia. Discussed findings with patient. At this time think it is reasonable for discharge home. Discussed we would call if culture is positive. Discussed importance of urology follow up.  ____________________________________________   FINAL  CLINICAL IMPRESSION(S) / ED DIAGNOSES  Final diagnoses:  Hematuria, unspecified type     Note: This dictation was prepared with Dragon dictation. Any transcriptional errors that result from this process are unintentional     Nance Pear, MD 12/16/19 1751

## 2019-12-16 NOTE — ED Notes (Signed)
Pt given warm blankets by Caryl Pina, RN.

## 2019-12-16 NOTE — ED Notes (Addendum)
See triage note. Pt alert and calmly sitting in bed. In NAD. Pt denies pain currently and denies pain upon urination. States steady on her feet and has requested to use restroom to urinate again. Pt denies fever.

## 2019-12-16 NOTE — ED Notes (Signed)
Pt urinated. Steady back to bed. Bed locked low. Rail up. Call bell within reach.

## 2019-12-16 NOTE — ED Triage Notes (Signed)
Pt here for hematuria for couple days. Denies pain.  No NVD.  Pt was on eliquids but reports only took one pill and it hurt her stomach so stopped taking " a while ago".  Pt is unsure why she is on eliquis and has not notified prescribing MD because she has not had a visit since then.  NAD. vSS

## 2019-12-16 NOTE — Discharge Instructions (Addendum)
As we discussed we are sending your urine out for a culture, if it comes back and shows an infection we will call you in an antibiotic. Additionally it is important that you follow up with the urologist listed. Please seek medical attention for any high fevers, chest pain, shortness of breath, change in behavior, persistent vomiting, bloody stool or any other new or concerning symptoms.

## 2019-12-18 LAB — URINE CULTURE: Culture: 30000 — AB

## 2019-12-23 NOTE — Progress Notes (Signed)
12/24/19 7:00 PM   Kristy Solomon 22-Apr-1929 [id number]  Referring provider: Jodi Marble, MD [place],  [place] [address]  Chief Complaint  Patient presents with  . Hematuria    HPI: Kristy Solomon is a 86 y.o. F who presents today after ED visit for the evaluation and management of hematuria.   She has a personal history of lymphoma followed by Dr. Mike Gip.   CT scan from 07/2019 showed ill-defined soft tissue infiltrated into the renal sinus and extended into region of vascular pedicle. Stable left renal calculi/ infiltrative mass in lower pole region dated bck to 2017 presumably related to lymphoma.   She presented to ED on 12/16/19 complaining of hematuria with lack of UTI symptoms.  Blood for 5 days, now resolved.  Her UA during visit indicated small heme on dip, 30 protein, 6-10 WBC and 11-20 RBC with a associated positive culture as below.  She was never called to start abx.  She denies burning with urination or any associated UTI symptoms. She reports of no gross hematuria since ED visit. She does have urinary frequency which she attributes to lasix.   +Urine culture  12/16/19 indicative of E. Coli resistant to ampicillin and ampicillin/sulbactam   PMH: Past Medical History:  Diagnosis Date  . 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 Hyperlipidemia   . Hypertension   . 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 Renal insufficiency     Surgical History: Past Surgical History:  Procedure Laterality Date  . ABDOMINAL HYSTERECTOMY    . HEMORRHOID SURGERY      Home Medications:  Allergies as of 12/24/2019      Reactions   Eliquis [apixaban]    abd pain      Medication List       Accurate as of December 24, 2019  7:00 PM. If you have any questions, ask your nurse or doctor.        STOP taking these medications   apixaban 2.5 MG Tabs tablet Commonly known as: Eliquis Stopped by: Hollice Espy, MD     TAKE these  medications   allopurinol 100 MG tablet Commonly known as: Zyloprim Take 2 tablets (200 mg total) by mouth daily.   amLODipine-benazepril 5-20 MG capsule Commonly known as: LOTREL Take 1 capsule by mouth daily.   aspirin 81 MG EC tablet Take 81 mg by mouth daily.   atorvastatin 20 MG tablet Commonly known as: LIPITOR Take 20 mg by mouth daily.   Calcium Citrate-Vitamin D 315-250 MG-UNIT Tabs Take 1 tablet by mouth daily.   colchicine 0.6 MG tablet Take 0.6 mg by mouth as needed (gout).   diclofenac sodium 1 % Gel Commonly known as: VOLTAREN Apply 4 g topically 2 (two) times daily as needed.   folic acid 1 MG tablet Commonly known as: FOLVITE TAKE 1 TABLET BY MOUTH ONCE DAILY   gabapentin 300 MG capsule Commonly known as: NEURONTIN Take 300 mg by mouth 3 (three) times daily.   hydrALAZINE 50 MG tablet Commonly known as: APRESOLINE Take 1 tablet (50 mg total) by mouth 3 (three) times daily.   Imbruvica 420 MG Tabs Generic drug: Ibrutinib Take 420 mg by mouth daily.   metoprolol succinate 50 MG 24 hr tablet Commonly known as: TOPROL-XL Take 1 tablet (50 mg total) by mouth daily.   nitrofurantoin (macrocrystal-monohydrate) 100 MG capsule Commonly known as: MACROBID Take 1 capsule (100 mg total) by mouth every 12 (twelve) hours. Started by: Caryl Pina  Erlene Quan, MD   omeprazole 20 MG capsule Commonly known as: PRILOSEC TK 1 C PO D   potassium chloride 10 MEQ CR capsule Commonly known as: MICRO-K TK 1 C PO QAM   torsemide 20 MG tablet Commonly known as: Demadex Take 2 tablets (40 mg total) by mouth daily.       Allergies:  Allergies  Allergen Reactions  . Eliquis [Apixaban]     abd pain    Family History: Family History  Problem Relation Age of Onset  . Cancer Brother        throat ca  . Cancer Brother        bone cancer    Social History:  reports that she has never smoked. She has never used smokeless tobacco. She reports that she does not drink  alcohol or use drugs.   Physical Exam: BP 138/87   Pulse 88   Constitutional:  Alert and oriented, No acute distress. In wheel chair.   HEENT: [place] AT, moist mucus membranes.  Trachea midline, no masses. Cardiovascular: + bilateral LE edema Respiratory: Normal respiratory effort, no increased work of breathing. Skin: No rashes, bruises or suspicious lesions. Neurologic: Grossly intact, no focal deficits, moving all 4 extremities. Psychiatric: Normal mood and affect.  Laboratory Data:  Lab Results  Component Value Date   CREATININE 2.13 (H) 12/16/2019    Urinalysis UA today no RBC and few bacteria.   Pertinent Imaging: CLINICAL DATA:  Restaging lymphoma.  EXAM: CT CHEST, ABDOMEN AND PELVIS WITHOUT CONTRAST  TECHNIQUE: Multidetector CT imaging of the chest, abdomen and pelvis was performed following the standard protocol without IV contrast.  COMPARISON:  07/11/2018  FINDINGS: CT CHEST FINDINGS  Cardiovascular: The heart is mildly enlarged but stable. No pericardial effusion. Stable advanced atherosclerotic calcifications involving the aorta and branch vessels but no focal aneurysm. Extensive three-vessel coronary artery calcifications.  Mediastinum/Nodes: No mediastinal or hilar mass or lymphadenopathy. Stable sizable left thyroid lobe lesions the largest is in the lower isthmus and measures 2.5 cm.  The esophagus is grossly normal.  Lungs/Pleura: Patchy nodular airspace opacities in the lower lobes bilaterally but no discrete mass. Stable basilar scarring changes. No pleural effusions.  Musculoskeletal: Small scattered chest wall lesions are again demonstrated. 2.6 cm left medial breast lesion previously measured 2.3 cm.  Right parascapular subcutaneous nodule measures 17.5 mm and previously measured 24.5 mm. Smaller right-sided subcutaneous lesions have decreased in size since the prior study. No new lesions.  No significant bony  findings.  CT ABDOMEN PELVIS FINDINGS  Hepatobiliary: No focal hepatic lesions or intrahepatic biliary dilatation. The gallbladder appears normal. No common bile duct dilatation.  Pancreas: No mass, inflammation or ductal dilatation.  Spleen: Normal size.  No focal lesions.  Adrenals/Urinary Tract: The adrenal glands are unremarkable and stable.  Extensive right-sided renal calculi along with a rim calcified cyst. No worrisome right renal lesions. No hydroureteronephrosis.  The left kidney demonstrates persistent ill-defined soft tissue infiltration in the renal sinus and extending into the region of the vascular pedicle. This could be chronic infiltrating lymphoma. No new or progressive findings. Stable left renal calculi in the lower pole region.  The bladder is unremarkable.  Stomach/Bowel: The stomach, duodenum, small bowel and colon are grossly normal. No acute inflammatory changes, mass lesions or obstructive findings. The terminal ileum and appendix are normal. Scattered colonic diverticulosis without findings for acute diverticulitis.  Vascular/Lymphatic: Advanced atherosclerotic calcifications involving the aorta and branch vessels. No mesenteric or retroperitoneal lymphadenopathy. No pelvic adenopathy.  No inguinal adenopathy.  Reproductive: Surgically absent.  Other: No pelvic mass or free pelvic fluid collections.  Musculoskeletal: No significant bony findings.  IMPRESSION: 1. Stable left breast lesion. The other subcutaneous lesions involving the right chest wall have decreased in size since the prior examination. No new or progressive findings. 2. Improved CT appearance of the lungs. Persistent patchy airspace opacities and scarring type changes but no discrete mass or nodule. No mediastinal or hilar adenopathy. 3. Stable soft tissue density infiltrating the left kidney and left renal sinus and vascular pedicle region. 4. Stable bilateral  renal calculi. 5. No abdominal/pelvic lymphadenopathy or inguinal adenopathy. 6. Stable severe atherosclerotic disease.   Electronically Signed   By: Marijo Sanes M.D.   On: 07/12/2019 15:48  I have personally reviewed the images and agree with radiologist interpretation.   Assessment & Plan:    1.Hematuria  UA today negative for RBC Will repeat UA in 6 weeks  Hold off on work up for time being, possible contributing factors as below  2. Cystitis  Not treated by urinary tract infection likely cause of gross hematuria  May also be related to infiltrated renal mass as below Rx of Macrobid 100 mg given to pt for 10 days  Defer cystoscopy until repeat UA in 6 weeks. If blood present then will consider cysto.  F/u with  PA for UA   3. Left renal mass Known infiltrated lymphoma otherwise stable   Return in about 6 weeks (around 02/04/2020) for urine recheck.  [facility] [address] [place], [place] [address] [phone]  I, Lucas Mallow, am acting as a scribe for Dr. Hollice Espy,  I have reviewed the above documentation for accuracy and completeness, and I agree with the above.   Hollice Espy, MD

## 2019-12-24 ENCOUNTER — Ambulatory Visit (INDEPENDENT_AMBULATORY_CARE_PROVIDER_SITE_OTHER): Payer: Medicare HMO | Admitting: Urology

## 2019-12-24 ENCOUNTER — Other Ambulatory Visit: Payer: Self-pay

## 2019-12-24 ENCOUNTER — Encounter: Payer: Self-pay | Admitting: Urology

## 2019-12-24 VITALS — BP 138/87 | HR 88

## 2019-12-24 DIAGNOSIS — N2889 Other specified disorders of kidney and ureter: Secondary | ICD-10-CM | POA: Diagnosis not present

## 2019-12-24 DIAGNOSIS — N3001 Acute cystitis with hematuria: Secondary | ICD-10-CM

## 2019-12-24 DIAGNOSIS — R319 Hematuria, unspecified: Secondary | ICD-10-CM

## 2019-12-24 LAB — MICROSCOPIC EXAMINATION: RBC, Urine: NONE SEEN /hpf (ref 0–2)

## 2019-12-24 LAB — URINALYSIS, COMPLETE
Bilirubin, UA: NEGATIVE
Glucose, UA: NEGATIVE
Ketones, UA: NEGATIVE
Nitrite, UA: NEGATIVE
Specific Gravity, UA: 1.02 (ref 1.005–1.030)
Urobilinogen, Ur: 0.2 mg/dL (ref 0.2–1.0)
pH, UA: 5.5 (ref 5.0–7.5)

## 2019-12-24 MED ORDER — NITROFURANTOIN MONOHYD MACRO 100 MG PO CAPS: 100.0000 mg | ORAL_CAPSULE | Freq: Two times a day (BID) | ORAL | 0 refills | Status: DC

## 2019-12-26 MED FILL — IMBRUVICA 420 MG TAB: 420 | 28 days supply | Qty: 28 | Fill #0

## 2019-12-31 ENCOUNTER — Emergency Department: Payer: Medicare HMO

## 2019-12-31 ENCOUNTER — Inpatient Hospital Stay
Admission: EM | Admit: 2019-12-31 | Discharge: 2020-01-09 | DRG: 291 | Disposition: A | Discharge status: Home Health | Payer: Medicare HMO | Attending: Internal Medicine | Admitting: Internal Medicine

## 2019-12-31 ENCOUNTER — Other Ambulatory Visit: Payer: Self-pay

## 2019-12-31 ENCOUNTER — Encounter: Payer: Self-pay | Admitting: Internal Medicine

## 2019-12-31 ENCOUNTER — Inpatient Hospital Stay (HOSPITAL_COMMUNITY)
Admit: 2019-12-31 | Discharge: 2019-12-31 | Disposition: A | Discharge status: Home / Self Care | Payer: Medicare HMO | Attending: Internal Medicine | Admitting: Internal Medicine

## 2019-12-31 DIAGNOSIS — N184 Chronic kidney disease, stage 4 (severe): Secondary | ICD-10-CM | POA: Diagnosis present

## 2019-12-31 DIAGNOSIS — I5033 Acute on chronic diastolic (congestive) heart failure: Secondary | ICD-10-CM | POA: Diagnosis not present

## 2019-12-31 DIAGNOSIS — I509 Heart failure, unspecified: Secondary | ICD-10-CM

## 2019-12-31 DIAGNOSIS — Z90710 Acquired absence of both cervix and uterus: Secondary | ICD-10-CM

## 2019-12-31 DIAGNOSIS — I10 Essential (primary) hypertension: Secondary | ICD-10-CM | POA: Diagnosis present

## 2019-12-31 DIAGNOSIS — R001 Bradycardia, unspecified: Secondary | ICD-10-CM | POA: Diagnosis present

## 2019-12-31 DIAGNOSIS — R600 Localized edema: Secondary | ICD-10-CM

## 2019-12-31 DIAGNOSIS — D72819 Decreased white blood cell count, unspecified: Secondary | ICD-10-CM | POA: Diagnosis present

## 2019-12-31 DIAGNOSIS — I130 Hypertensive heart and chronic kidney disease with heart failure and stage 1 through stage 4 chronic kidney disease, or unspecified chronic kidney disease: Principal | ICD-10-CM | POA: Diagnosis present

## 2019-12-31 DIAGNOSIS — J9601 Acute respiratory failure with hypoxia: Secondary | ICD-10-CM | POA: Diagnosis not present

## 2019-12-31 DIAGNOSIS — J181 Lobar pneumonia, unspecified organism: Secondary | ICD-10-CM | POA: Diagnosis not present

## 2019-12-31 DIAGNOSIS — C8800 Waldenstrom macroglobulinemia not having achieved remission: Secondary | ICD-10-CM | POA: Diagnosis present

## 2019-12-31 DIAGNOSIS — D696 Thrombocytopenia, unspecified: Secondary | ICD-10-CM | POA: Diagnosis present

## 2019-12-31 DIAGNOSIS — C8590 Non-Hodgkin lymphoma, unspecified, unspecified site: Secondary | ICD-10-CM | POA: Diagnosis present

## 2019-12-31 DIAGNOSIS — I482 Chronic atrial fibrillation, unspecified: Secondary | ICD-10-CM | POA: Diagnosis present

## 2019-12-31 DIAGNOSIS — N1832 Chronic kidney disease, stage 3b: Secondary | ICD-10-CM

## 2019-12-31 DIAGNOSIS — Z20822 Contact with and (suspected) exposure to covid-19: Secondary | ICD-10-CM | POA: Diagnosis present

## 2019-12-31 DIAGNOSIS — N179 Acute kidney failure, unspecified: Secondary | ICD-10-CM | POA: Diagnosis present

## 2019-12-31 DIAGNOSIS — R609 Edema, unspecified: Secondary | ICD-10-CM

## 2019-12-31 DIAGNOSIS — I5031 Acute diastolic (congestive) heart failure: Secondary | ICD-10-CM | POA: Diagnosis not present

## 2019-12-31 DIAGNOSIS — E785 Hyperlipidemia, unspecified: Secondary | ICD-10-CM | POA: Diagnosis present

## 2019-12-31 DIAGNOSIS — K219 Gastro-esophageal reflux disease without esophagitis: Secondary | ICD-10-CM | POA: Diagnosis present

## 2019-12-31 DIAGNOSIS — R778 Other specified abnormalities of plasma proteins: Secondary | ICD-10-CM | POA: Diagnosis present

## 2019-12-31 DIAGNOSIS — R7303 Prediabetes: Secondary | ICD-10-CM | POA: Diagnosis present

## 2019-12-31 DIAGNOSIS — R7989 Other specified abnormal findings of blood chemistry: Secondary | ICD-10-CM | POA: Diagnosis present

## 2019-12-31 DIAGNOSIS — M109 Gout, unspecified: Secondary | ICD-10-CM | POA: Diagnosis present

## 2019-12-31 DIAGNOSIS — I5043 Acute on chronic combined systolic (congestive) and diastolic (congestive) heart failure: Secondary | ICD-10-CM

## 2019-12-31 DIAGNOSIS — R0602 Shortness of breath: Secondary | ICD-10-CM

## 2019-12-31 DIAGNOSIS — I4819 Other persistent atrial fibrillation: Secondary | ICD-10-CM

## 2019-12-31 DIAGNOSIS — Z79899 Other long term (current) drug therapy: Secondary | ICD-10-CM

## 2019-12-31 DIAGNOSIS — I248 Other forms of acute ischemic heart disease: Secondary | ICD-10-CM | POA: Diagnosis present

## 2019-12-31 LAB — COMPREHENSIVE METABOLIC PANEL
ALT: 14 U/L (ref 0–44)
AST: 20 U/L (ref 15–41)
Albumin: 3 g/dL — ABNORMAL LOW (ref 3.5–5.0)
Alkaline Phosphatase: 32 U/L — ABNORMAL LOW (ref 38–126)
Anion gap: 8 (ref 5–15)
BUN: 30 mg/dL — ABNORMAL HIGH (ref 8–23)
CO2: 23 mmol/L (ref 22–32)
Calcium: 8 mg/dL — ABNORMAL LOW (ref 8.9–10.3)
Chloride: 110 mmol/L (ref 98–111)
Creatinine, Ser: 1.56 mg/dL — ABNORMAL HIGH (ref 0.44–1.00)
GFR calc Af Amer: 34 mL/min — ABNORMAL LOW (ref >60)
GFR calc non Af Amer: 29 mL/min — ABNORMAL LOW (ref >60)
Glucose, Bld: 88 mg/dL (ref 70–99)
Potassium: 4.4 mmol/L (ref 3.5–5.1)
Sodium: 141 mmol/L (ref 135–145)
Total Bilirubin: 0.9 mg/dL (ref 0.3–1.2)
Total Protein: 5.8 g/dL — ABNORMAL LOW (ref 6.5–8.1)

## 2019-12-31 LAB — CBC WITH DIFFERENTIAL/PLATELET
Abs Immature Granulocytes: 0.01 10*3/uL (ref 0.00–0.07)
Basophils Absolute: 0 10*3/uL (ref 0.0–0.1)
Basophils Relative: 1 %
Eosinophils Absolute: 0 10*3/uL (ref 0.0–0.5)
Eosinophils Relative: 0 %
HCT: 43.9 % (ref 36.0–46.0)
Hemoglobin: 13.8 g/dL (ref 12.0–15.0)
Immature Granulocytes: 0 %
Lymphocytes Relative: 24 %
Lymphs Abs: 0.9 10*3/uL (ref 0.7–4.0)
MCH: 26 pg (ref 26.0–34.0)
MCHC: 31.4 g/dL (ref 30.0–36.0)
MCV: 82.8 fL (ref 80.0–100.0)
Monocytes Absolute: 0.6 10*3/uL (ref 0.1–1.0)
Monocytes Relative: 16 %
Neutro Abs: 2.1 10*3/uL (ref 1.7–7.7)
Neutrophils Relative %: 59 %
Platelets: 76 10*3/uL — ABNORMAL LOW (ref 150–400)
RBC: 5.3 MIL/uL — ABNORMAL HIGH (ref 3.87–5.11)
RDW: 16.2 % — ABNORMAL HIGH (ref 11.5–15.5)
WBC: 3.5 10*3/uL — ABNORMAL LOW (ref 4.0–10.5)
nRBC: 0 % (ref 0.0–0.2)

## 2019-12-31 LAB — SARS CORONAVIRUS 2 (TAT 6-24 HRS): SARS Coronavirus 2: NEGATIVE

## 2019-12-31 LAB — TROPONIN I (HIGH SENSITIVITY)
Troponin I (High Sensitivity): 43 ng/L — ABNORMAL HIGH (ref <18)
Troponin I (High Sensitivity): 50 ng/L — ABNORMAL HIGH (ref <18)
Troponin I (High Sensitivity): 59 ng/L — ABNORMAL HIGH (ref <18)
Troponin I (High Sensitivity): 51 ng/L — ABNORMAL HIGH (ref <18)
Troponin I (High Sensitivity): 49 ng/L — ABNORMAL HIGH (ref <18)

## 2019-12-31 LAB — ECHOCARDIOGRAM COMPLETE
Height: 64 in
Weight: 3360 oz

## 2019-12-31 LAB — BRAIN NATRIURETIC PEPTIDE: B Natriuretic Peptide: 203 pg/mL — ABNORMAL HIGH (ref 0.0–100.0)

## 2019-12-31 MED ORDER — ONDANSETRON HCL 4 MG/2ML IJ SOLN
4.0000 mg | Freq: Four times a day (QID) | INTRAMUSCULAR | Status: DC | PRN
  Filled 2019-12-31: qty 2

## 2019-12-31 MED ORDER — FUROSEMIDE 10 MG/ML IJ SOLN: 60.0000 mg | Freq: Two times a day (BID) | INTRAMUSCULAR | Status: DC

## 2019-12-31 MED ORDER — IBRUTINIB 420 MG PO TABS
420.0000 mg | ORAL_TABLET | Freq: Every day | ORAL | Status: DC
  Filled 2019-12-31: qty 1

## 2019-12-31 MED ORDER — FUROSEMIDE 10 MG/ML IJ SOLN
60.0000 mg | Freq: Once | INTRAMUSCULAR | Status: AC
  Administered 2019-12-31: 60 mg via INTRAVENOUS
  Filled 2019-12-31: qty 8

## 2019-12-31 MED ORDER — GABAPENTIN 300 MG PO CAPS
300.0000 mg | ORAL_CAPSULE | Freq: Three times a day (TID) | ORAL | Status: DC
  Administered 2019-12-31 – 2020-01-01 (×2): 300 mg via ORAL
  Filled 2019-12-31 (×4): qty 1

## 2019-12-31 MED ORDER — SODIUM CHLORIDE 0.9 % IV SOLN
250.0000 mL | INTRAVENOUS | Status: DC | PRN
  Administered 2020-01-06: 250 mL via INTRAVENOUS

## 2019-12-31 MED ORDER — METOPROLOL SUCCINATE ER 25 MG PO TB24
12.5000 mg | ORAL_TABLET | Freq: Every day | ORAL | Status: DC
  Administered 2019-12-31: 12.5 mg via ORAL
  Filled 2019-12-31: qty 1

## 2019-12-31 MED ORDER — AMLODIPINE BESY-BENAZEPRIL HCL 5-20 MG PO CAPS: 1.0000 | ORAL_CAPSULE | Freq: Every day | ORAL | Status: DC

## 2019-12-31 MED ORDER — AMLODIPINE BESYLATE 5 MG PO TABS
5.0000 mg | ORAL_TABLET | Freq: Every day | ORAL | Status: DC
  Administered 2019-12-31: 5 mg via ORAL
  Filled 2019-12-31: qty 1

## 2019-12-31 MED ORDER — ALLOPURINOL 100 MG PO TABS
200.0000 mg | ORAL_TABLET | Freq: Every day | ORAL | Status: DC
  Administered 2019-12-31 – 2020-01-09 (×9): 200 mg via ORAL
  Filled 2019-12-31 (×11): qty 2

## 2019-12-31 MED ORDER — NITROFURANTOIN MONOHYD MACRO 100 MG PO CAPS: 100.0000 mg | ORAL_CAPSULE | Freq: Two times a day (BID) | ORAL | Status: DC

## 2019-12-31 MED ORDER — ACETAMINOPHEN 325 MG PO TABS
650.0000 mg | ORAL_TABLET | ORAL | Status: DC | PRN
  Administered 2020-01-04: 650 mg via ORAL

## 2019-12-31 MED ORDER — HYDRALAZINE HCL 20 MG/ML IJ SOLN: 5.0000 mg | INTRAMUSCULAR | Status: DC | PRN

## 2019-12-31 MED ORDER — DM-GUAIFENESIN ER 30-600 MG PO TB12
1.0000 | ORAL_TABLET | Freq: Two times a day (BID) | ORAL | Status: DC
  Administered 2019-12-31 – 2020-01-09 (×19): 1 via ORAL
  Filled 2019-12-31 (×19): qty 1

## 2019-12-31 MED ORDER — METOPROLOL TARTRATE 5 MG/5ML IV SOLN: 2.5000 mg | INTRAVENOUS | Status: DC | PRN

## 2019-12-31 MED ORDER — ASPIRIN EC 81 MG PO TBEC
81.0000 mg | DELAYED_RELEASE_TABLET | Freq: Every day | ORAL | Status: DC
  Administered 2019-12-31 – 2020-01-05 (×6): 81 mg via ORAL
  Filled 2019-12-31 (×6): qty 1

## 2019-12-31 MED ORDER — ATORVASTATIN CALCIUM 20 MG PO TABS
20.0000 mg | ORAL_TABLET | Freq: Every day | ORAL | Status: DC
  Administered 2019-12-31 – 2020-01-09 (×10): 20 mg via ORAL
  Filled 2019-12-31 (×10): qty 1

## 2019-12-31 MED ORDER — SODIUM CHLORIDE 0.9% FLUSH
3.0000 mL | Freq: Two times a day (BID) | INTRAVENOUS | Status: DC
  Administered 2019-12-31 – 2020-01-08 (×16): 3 mL via INTRAVENOUS

## 2019-12-31 MED ORDER — HYDRALAZINE HCL 50 MG PO TABS
50.0000 mg | ORAL_TABLET | Freq: Three times a day (TID) | ORAL | Status: DC
  Administered 2019-12-31: 50 mg via ORAL
  Filled 2019-12-31: qty 1

## 2019-12-31 MED ORDER — IBRUTINIB 420 MG PO TABS
420.0000 mg | ORAL_TABLET | Freq: Every day | ORAL | Status: DC
  Administered 2019-12-31 – 2020-01-09 (×10): 420 mg via ORAL
  Filled 2019-12-31 (×9): qty 1

## 2019-12-31 MED ORDER — CEPHALEXIN 250 MG PO CAPS
500.0000 mg | ORAL_CAPSULE | Freq: Two times a day (BID) | ORAL | Status: AC
  Administered 2019-12-31 – 2020-01-03 (×8): 500 mg via ORAL
  Filled 2019-12-31 (×8): qty 2

## 2019-12-31 MED ORDER — ALBUTEROL SULFATE (2.5 MG/3ML) 0.083% IN NEBU
2.5000 mg | INHALATION_SOLUTION | RESPIRATORY_TRACT | Status: DC | PRN
  Administered 2020-01-06 – 2020-01-07 (×2): 2.5 mg via RESPIRATORY_TRACT
  Filled 2019-12-31: qty 3

## 2019-12-31 MED ORDER — SODIUM CHLORIDE 0.9% FLUSH: 3.0000 mL | INTRAVENOUS | Status: DC | PRN

## 2019-12-31 MED ORDER — BENAZEPRIL HCL 20 MG PO TABS
20.0000 mg | ORAL_TABLET | Freq: Every day | ORAL | Status: DC
  Administered 2019-12-31: 12:00:00 20 mg via ORAL
  Filled 2019-12-31: qty 1

## 2019-12-31 MED ORDER — ALBUTEROL SULFATE HFA 108 (90 BASE) MCG/ACT IN AERS: 2.0000 | INHALATION_SPRAY | RESPIRATORY_TRACT | Status: DC | PRN

## 2019-12-31 NOTE — ED Notes (Signed)
ED Provider at bedside. 

## 2019-12-31 NOTE — H&P (Addendum)
History and Physical    Kristy Solomon LPF:[id number] DOB: 1929-07-02 DOA: 12/31/2019  Referring MD/NP/PA:   PCP: Jodi Marble, MD   Patient coming from:  The patient is coming from home.  At baseline, pt is independent for most of ADL.        Chief Complaint: SOB and leg edema HPI: Kristy Solomon is a 86 y.o. female with medical history significant of hypertension, hyperlipidemia, gout, GERD, lymphoma, CKD-4, thrombocytopenia, CHF, atrial fibrillation not on anticoagulants, who presents with shortness breath and bilateral leg edema.  Patient states that she has been having shortness of breath in the past several days, which has been progressively worsening.  She has orthopnea.  Shortness breath is getting worse with minimal exertion.  She also has worsening bilateral leg edema.  Her PCP increased her torsemide recently dose without significant improvement.  Patient has mild dry cough, but no fever or chills.  Denies nausea, vomiting, diarrhea, abdominal pain, symptoms of UTI or unilateral weakness.  ED Course: pt was found to have WBC 3.5, pending COVID-19 PCR, troponin 43, BNP 2 3, stable renal function, temperature 99.3, blood pressure 158/80, bradycardia, oxygen saturation 96% on room air, chest x-ray with cardiomegaly, vascular congestion, and bilateral basilar ill-defined opacity.  Patient is admitted to telemetry bed as inpatient.  Review of Systems:   General: no fevers, chills,  has fatigue HEENT: no blurry vision, hearing changes or sore throat Respiratory: has dyspnea, coughing, no wheezing CV: no chest pain, no palpitations GI: no nausea, vomiting, abdominal pain, diarrhea, constipation GU: no dysuria, burning on urination, increased urinary frequency, hematuria  Ext: has leg edema Neuro: no unilateral weakness, numbness, or tingling, no vision change or hearing loss Skin: no rash, no skin tear. MSK: No muscle spasm, no deformity, no limitation of range of movement  in spin Heme: No easy bruising.  Travel history: No recent long distant travel.  Allergy:  Allergies  Allergen Reactions  . Eliquis [Apixaban]     abd pain    Past Medical History:  Diagnosis Date  . 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 Hyperlipidemia   . Hypertension   . 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 Renal insufficiency     Past Surgical History:  Procedure Laterality Date  . ABDOMINAL HYSTERECTOMY    . HEMORRHOID SURGERY      Social History:  reports that she has never smoked. She has never used smokeless tobacco. She reports that she does not drink alcohol or use drugs.  Family History:  Family History  Problem Relation Age of Onset  . Cancer Brother        throat ca  . Cancer Brother        bone cancer     Prior to Admission medications   Medication Sig Start Date End Date Taking? Authorizing Provider  allopurinol (ZYLOPRIM) 100 MG tablet Take 2 tablets (200 mg total) by mouth daily. 09/11/18  Yes Karen Kitchens, NP  amLODipine-benazepril (LOTREL) 5-20 MG capsule Take 1 capsule by mouth daily.  10/08/19  Yes [provider]  atorvastatin (LIPITOR) 20 MG tablet Take 20 mg by mouth daily. 12/24/13  Yes [provider]  gabapentin (NEURONTIN) 300 MG capsule Take 300 mg by mouth 3 (three) times daily.    Yes [provider]  hydrALAZINE (APRESOLINE) 50 MG tablet Take 1 tablet (50 mg total) by mouth 3 (three) times daily. 10/18/19 10/17/20 Yes Agbor-Etang, Aaron Edelman, MD  Ibrutinib (IMBRUVICA) 420 MG TABS  Take 420 mg by mouth daily. 11/06/19  Yes Corcoran, Drue Second, MD  metoprolol succinate (TOPROL-XL) 50 MG 24 hr tablet Take 1 tablet (50 mg total) by mouth daily. Patient taking differently: Take 25 mg by mouth daily.  10/21/19  Yes Agbor-Etang, Aaron Edelman, MD  nitrofurantoin, macrocrystal-monohydrate, (MACROBID) 100 MG capsule Take 1 capsule (100 mg total) by mouth every 12 (twelve) hours. 12/24/19  Yes Hollice Espy, MD    potassium chloride (MICRO-K) 10 MEQ CR capsule TK 1 C PO QAM 04/25/19  Yes [provider]  torsemide (DEMADEX) 20 MG tablet Take 2 tablets (40 mg total) by mouth daily. 11/18/19  Yes Agbor-Etang, Aaron Edelman, MD  diclofenac sodium (VOLTAREN) 1 % GEL Apply 4 g topically 2 (two) times daily as needed. Patient not taking: Reported on 12/31/2019 09/15/18   Johnn Hai, PA-C  folic acid (FOLVITE) 1 MG tablet TAKE 1 TABLET BY MOUTH ONCE DAILY Patient not taking: Reported on 12/31/2019 07/03/18   Lequita Asal, MD    Physical Exam: Vitals:   12/31/19 0900 12/31/19 0901 12/31/19 0915 12/31/19 0930  BP: (!) 144/56   (!) 144/75  Pulse:   97 (!) 49  Resp: (!) 21  16 (!) 21  Temp:      TempSrc:      SpO2:   96% 94%  Weight:  95.3 kg    Height:  5\' 4"  (1.626 m)     General: Not in acute distress HEENT:       Eyes: PERRL, EOMI, no scleral icterus.       ENT: No discharge from the ears and nose, no pharynx injection, no tonsillar enlargement.        Neck: postive JVD, no bruit, no mass felt. Heme: No neck lymph node enlargement. Cardiac: S1/S2, RRR, No murmurs, No gallops or rubs. Respiratory: has fine crackles bilaterally at the base GI: Soft, nondistended, nontender, no rebound pain, no organomegaly, BS present. GU: No hematuria Ext: 3+ pitting leg edema bilaterally. 2+DP/PT pulse bilaterally. Musculoskeletal: No joint deformities, No joint redness or warmth, no limitation of ROM in spin. Skin: No rashes.  Neuro: Alert, oriented X3, cranial nerves II-XII grossly intact, moves all extremities normally. Psych: Patient is not psychotic, no suicidal or hemocidal ideation.  Labs on Admission: I have personally reviewed following labs and imaging studies  CBC: Recent Labs  Lab 12/31/19 0648  WBC 3.5*  NEUTROABS 2.1  HGB 13.8  HCT 43.9  MCV 82.8  PLT 76*   Basic Metabolic Panel: Recent Labs  Lab 12/31/19 0718  NA 141  K 4.4  CL 110  CO2 23  GLUCOSE 88  BUN 30*   CREATININE 1.56*  CALCIUM 8.0*   GFR: Estimated Creatinine Clearance: 27.9 mL/min (A) (by C-G formula based on SCr of 1.56 mg/dL (H)). Liver Function Tests: Recent Labs  Lab 12/31/19 0718  AST 20  ALT 14  ALKPHOS 32*  BILITOT 0.9  PROT 5.8*  ALBUMIN 3.0*   No results for input(s): LIPASE, AMYLASE in the last 168 hours. No results for input(s): AMMONIA in the last 168 hours. Coagulation Profile: No results for input(s): INR, PROTIME in the last 168 hours. Cardiac Enzymes: No results for input(s): CKTOTAL, CKMB, CKMBINDEX, TROPONINI in the last 168 hours. BNP (last 3 results) No results for input(s): PROBNP in the last 8760 hours. HbA1C: No results for input(s): HGBA1C in the last 72 hours. CBG: No results for input(s): GLUCAP in the last 168 hours. Lipid Profile: No results for  input(s): CHOL, HDL, LDLCALC, TRIG, CHOLHDL, LDLDIRECT in the last 72 hours. Thyroid Function Tests: No results for input(s): TSH, T4TOTAL, FREET4, T3FREE, THYROIDAB in the last 72 hours. Anemia Panel: No results for input(s): VITAMINB12, FOLATE, FERRITIN, TIBC, IRON, RETICCTPCT in the last 72 hours. Urine analysis:    Component Value Date/Time   COLORURINE STRAW (A) 12/16/2019 1339   APPEARANCEUR Cloudy (A) 12/24/2019 1354   LABSPEC 1.006 12/16/2019 1339   LABSPEC 1.019 04/12/2014 0111   PHURINE 6.0 12/16/2019 1339   GLUCOSEU Negative 12/24/2019 1354   GLUCOSEU Negative 04/12/2014 0111   HGBUR SMALL (A) 12/16/2019 1339   BILIRUBINUR Negative 12/24/2019 1354   BILIRUBINUR Negative 04/12/2014 0111   KETONESUR NEGATIVE 12/16/2019 1339   PROTEINUR 3+ (A) 12/24/2019 1354   PROTEINUR 30 (A) 12/16/2019 1339   NITRITE Negative 12/24/2019 1354   NITRITE NEGATIVE 12/16/2019 1339   LEUKOCYTESUR Trace (A) 12/24/2019 1354   LEUKOCYTESUR SMALL (A) 12/16/2019 1339   LEUKOCYTESUR 2+ 04/12/2014 0111   Sepsis Labs: @LABRCNTIP (procalcitonin:4,lacticidven:4) ) Recent Results (from the past 240 hour(s))   Microscopic Examination     Status: Abnormal   Collection Time: 12/24/19  1:54 PM   URINE  Result Value Ref Range Status   WBC, UA 6-10 (A) 0 - 5 /hpf Final   RBC None seen 0 - 2 /hpf Final   Epithelial Cells (non renal) 0-10 0 - 10 /hpf Final   Casts Present (A) None seen /lpf Final   Cast Type Hyaline casts N/A Final   Bacteria, UA Few (A) None seen/Few Final     Radiological Exams on Admission: DG Chest Portable 1 View  Result Date: 12/31/2019 CLINICAL DATA:  Leg swelling, CHF, shortness of breath. Additional history provided: EXAM: PORTABLE CHEST 1 VIEW COMPARISON:  CT chest 07/12/2019, chest radiograph 12/28/2017 FINDINGS: Unchanged cardiomegaly. Aortic atherosclerosis. Central pulmonary vascular congestion. Ill-defined opacities at the bilateral lung bases. A trace left pleural effusion may be present. No evidence of pneumothorax. No acute bony abnormality. IMPRESSION: Cardiomegaly with central pulmonary vascular congestion. Ill-defined opacities at the bilateral lung bases may reflect atelectasis and/or previously demonstrated scarring. Pneumonia is difficult to definitively exclude. Possible trace left pleural effusion. Aortic atherosclerosis. Electronically Signed   By: Kellie Simmering DO   On: 12/31/2019 07:24     EKG: Independently reviewed.  Atrial fibrillation, QTc 466, LAD, PVC, poor R wave progression and nonspecific T wave change.  Assessment/Plan Principal Problem:   Acute on chronic diastolic CHF (congestive heart failure) (HCC) Active Problems:   Malignant lymphoma, lymphoplasmacytic (HCC)   Thrombocytopenia (HCC)   HLD (hyperlipidemia)   HTN (hypertension)   CKD (chronic kidney disease), stage IV (HCC)   Atrial fibrillation, chronic (HCC)   Elevated troponin   Addendum: pt received IV lasix. Her blood pressure dropped to 79/52. -Will temporarily hold all blood pressure medications and diuretics.  Acute on chronic diastolic CHF (congestive heart failure) [facility]):  Patient has worsening shortness of breath, worsening bilateral leg edema, orthopnea, elevated BNP, positive JVD, chest x-ray showed cardiomegaly and vascular congestion.  Clinically consistent with CHF exacerbation.  2D echo on 10/10/2019 showed EF of 60-65%.  Patient is taking torsemide 40 mg daily at home  -will admit to tele bed as inpt. -Lasix 60 mg bid by IV -2d echo -Daily weights -strict I/O's -Low salt diet -Fluid restriction -Obtain REDs Vest reading  Elevated troponin: Initial troponin 43.  No chest pain most likely due to demand ischemia. -Trend troponin -A1c, FLP -Repeat EKG in morning -  Aspirin, Lipitor, metoprolol -Follow-up 2D echo  Malignant lymphoma, lymphoplasmacytic ([place]): -continue Ibrutinib -f/u with oncology  Thrombocytopenia ([place]): This is chronic issue.  Platelet 76.  No bleeding tendency. -f/u by CBC  HLD (hyperlipidemia) -Lipitor  Essential hypertension: -prn Hydralazine -Continue home medications: Lotrel, hydralazine, metoprolol  CKD (chronic kidney disease), stage IV ([place]): stable.  Recent baseline creatinine ~1.6.  Her creatinine is 1.56, BUN 30 -f/u by BMP  Atrial fibrillation, chronic ([place]): not taking anticoagulants at home.  Has bradycardia.  Heart rate 40s-59 -Decrease metoprolol dose from 25 to 12.5 mg daily    Inpatient status:  # Patient requires inpatient status due to high intensity of service, high risk for further deterioration and high frequency of surveillance required.  I certify that at the point of admission it is my clinical judgment that the patient will require inpatient hospital care spanning beyond 2 midnights from the point of admission.  . This patient has multiple chronic comorbidities including hypertension, hyperlipidemia, gout, GERD, lymphoma, CKD-4, thrombocytopenia, CHF, atrial fibrillation not on anticoagulants . Now patient has presenting with acute on chronic diastolic CHF  . The worrisome physical exam findings  include 3+ pitting leg edema bilaterally, positive JVD, crackles on auscultation. . The initial radiographic and laboratory data are worrisome because of elevated BNP, chest x-ray showed cardiomegaly and vascular congestion . Current medical needs: please see my assessment and plan . Predictability of an adverse outcome (risk): Patient has multiple comorbidities as listed above. Now presents with acute on chronic diastolic CHF. Patient's presentation is highly complicated. Failed outpt oral diuretic treatment. Given her old age, patient is at high risk of deteriorating.  Will need to be treated in hospital for at least 2 days.      DVT ppx: SCD Code Status: Full code Family Communication:   Yes, patient's d granddaughter by phone Disposition Plan:  Anticipate discharge back to previous home environment Consults called: None Admission status:  Tele bed  as inpt      Date of Service 12/31/2019    [facility]   If 7PM-7AM, please contact night-coverage [email or web address] 12/31/2019, 9:58 AM

## 2019-12-31 NOTE — ED Notes (Signed)
Recollect of light green collected at this time and sent to lab for processing.

## 2019-12-31 NOTE — Progress Notes (Signed)
Patient arrived on floor from ER. See assessment. Breathing is even and unlabored, although patient is slightly SOB with ambulation. All safety measures are in place. Vital signs are stable. Patient is in Afib, MD notified. Patient is asymptomatic. Will continue to monitor. Patient is alert and talking. Tele monitor in place.

## 2019-12-31 NOTE — ED Notes (Signed)
Attempted to call receiving floor at this time. Was unable to speak to receiving nurse. Will call back in a few minutes.

## 2019-12-31 NOTE — Progress Notes (Signed)
*  PRELIMINARY RESULTS* Echocardiogram 2D Echocardiogram has been performed.  Kristy Solomon 12/31/2019, 12:12 PM

## 2019-12-31 NOTE — Progress Notes (Signed)
Alerted MD, Dr. Blaine Hamper, that patient is on the floor, in Afib. HR low 100s. MD stated that she has a history of this. Alerted MD that patient is not on any anticoagulation. MD acknowledged. AM medications given.   At 1130, patient's HR would jump up to the 130s, non-sustained. Patient also has frequent PVCs. Alerted Dr. Blaine Hamper of this and requested he look at patient's tele monitor. MD ordered PRN metoprolol for 125 sustained. Patient is asymptomatic. Will continue to monitor patient closely.

## 2019-12-31 NOTE — Progress Notes (Signed)
All patient's medications are with patient in bag. Placed patient's patient's in a sealed security bag and delivered them to pharmacy. Listed on sheet for pharmacy. Signed by patient who consents to this.   In bag are the following pill bottles:  Torsemide Hydralazine Nitrofurantoin Atorvastatin Allopurinol Amlodipine Metoprolol Gabapentin  Potassium

## 2019-12-31 NOTE — ED Provider Notes (Signed)
[facility] Emergency Department Provider Note  ____________________________________________   First MD Initiated Contact with Patient 12/31/19 [phone]     (approximate)  I have reviewed the triage vital signs and the nursing notes.   HISTORY  Chief Complaint Leg Swelling    HPI Kristy Solomon is a 86 y.o. female   with past medical history of hypertension, hyperlipidemia, CHF, renal failure, lymphoma, here with bilateral lower extremity swelling and shortness of breath.  The patient states that for the last 3 months, she has gained approximately 15 to 20 pounds.  She has had increasing lower extremity edema that over the last week has significantly worsened.  She is having difficulty getting around due to this.  She also has had shortness of breath, orthopnea, and is having to sleep upright.  She has been increasing her Lasix per her PCP without significant relief.  No fevers or chills.  No known coronavirus exposures.  No sputum production.  No history of DVTs.  No other complaints.       Past Medical History:  Diagnosis Date  . 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 Hyperlipidemia   . Hypertension   . 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 Renal insufficiency     Patient Active Problem List   Diagnosis Date Noted  . Renal insufficiency 07/28/2019  . Bilateral edema of lower extremity 05/07/2019  . HLD (hyperlipidemia) 09/18/2018  . HTN (hypertension) 09/18/2018  . GERD (gastroesophageal reflux disease) 09/18/2018  . Back pain 09/18/2018  . Bradycardia 09/18/2018  . Bigeminy 09/18/2018  . Nephrolithiasis 01/11/2018  . Goals of care, counseling/discussion 12/28/2017  . Gastroenteritis 12/28/2017  . B12 deficiency 03/18/2016  . Folate deficiency 03/18/2016  . Iron deficiency anemia 03/18/2016  . Pneumonia 03/17/2016  . Pressure ulcer 03/17/2016  . Thrombocytopenia ([place]) 07/04/2014  . Acute renal failure ([place]) 07/04/2014    . Hyperuricemia 07/04/2014  . 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 Calculi, ureter 07/03/2012  . Frank hematuria 07/03/2012  . Hydronephrosis 07/03/2012  . 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 Calculus of kidney 07/02/2012  . Nonspecific finding on examination of urine 07/02/2012    Past Surgical History:  Procedure Laterality Date  . ABDOMINAL HYSTERECTOMY    . HEMORRHOID SURGERY      Prior to Admission medications   Medication Sig Start Date End Date Taking? Authorizing Provider  allopurinol (ZYLOPRIM) 100 MG tablet Take 2 tablets (200 mg total) by mouth daily. 09/11/18  Yes Karen Kitchens, NP  amLODipine-benazepril (LOTREL) 5-20 MG capsule Take 1 capsule by mouth daily.  10/08/19  Yes [provider]  atorvastatin (LIPITOR) 20 MG tablet Take 20 mg by mouth daily. 12/24/13  Yes [provider]  gabapentin (NEURONTIN) 300 MG capsule Take 300 mg by mouth 3 (three) times daily.    Yes [provider]  hydrALAZINE (APRESOLINE) 50 MG tablet Take 1 tablet (50 mg total) by mouth 3 (three) times daily. 10/18/19 10/17/20 Yes Agbor-Etang, Aaron Edelman, MD  Ibrutinib (IMBRUVICA) 420 MG TABS Take 420 mg by mouth daily. 11/06/19  Yes Corcoran, Drue Second, MD  metoprolol succinate (TOPROL-XL) 50 MG 24 hr tablet Take 1 tablet (50 mg total) by mouth daily. Patient taking differently: Take 25 mg by mouth daily.  10/21/19  Yes Kate Sable, MD  nitrofurantoin, Earney [place], (MACROBID)  100 MG capsule Take 1 capsule (100 mg total) by mouth every 12 (twelve) hours. 12/24/19  Yes Hollice Espy, MD  potassium chloride (MICRO-K) 10 MEQ CR capsule TK 1 C PO QAM 04/25/19  Yes [provider]  torsemide (DEMADEX) 20 MG tablet Take 2 tablets (40 mg total) by mouth  daily. 11/18/19  Yes Agbor-Etang, Aaron Edelman, MD  diclofenac sodium (VOLTAREN) 1 % GEL Apply 4 g topically 2 (two) times daily as needed. Patient not taking: Reported on 12/31/2019 09/15/18   Johnn Hai, PA-C  folic acid (FOLVITE) 1 MG tablet TAKE 1 TABLET BY MOUTH ONCE DAILY Patient not taking: Reported on 12/31/2019 07/03/18   Lequita Asal, MD    Allergies Eliquis [apixaban]  Family History  Problem Relation Age of Onset  . Cancer Brother        throat ca  . Cancer Brother        bone cancer    Social History Social History   Tobacco Use  . Smoking status: Never Smoker  . Smokeless tobacco: Never Used  Substance Use Topics  . Alcohol use: No  . Drug use: No    Review of Systems  Review of Systems  Constitutional: Positive for fatigue. Negative for fever.  HENT: Negative for congestion and sore throat.   Eyes: Negative for visual disturbance.  Respiratory: Positive for cough and shortness of breath.   Cardiovascular: Positive for leg swelling. Negative for chest pain.  Gastrointestinal: Negative for abdominal pain, diarrhea, nausea and vomiting.  Genitourinary: Negative for flank pain.  Musculoskeletal: Negative for back pain and neck pain.  Skin: Negative for rash and wound.  Neurological: Positive for weakness.  All other systems reviewed and are negative.    ____________________________________________  PHYSICAL EXAM:      VITAL SIGNS: ED Triage Vitals  Enc Vitals Group     BP 12/31/19 0532 (!) 170/117     Pulse Rate 12/31/19 0532 (!) 59     Resp 12/31/19 0533 20     Temp 12/31/19 0533 99.3 F (37.4 C)     Temp Source 12/31/19 0533 Oral     SpO2 12/31/19 0532 97 %     Weight --      Height --      Head Circumference --      Peak Flow --      Pain Score 12/31/19 0534 7     Pain Loc --      Pain Edu? --      Excl. in [place]? --      Physical Exam Vitals and nursing note reviewed.  Constitutional:      General: She is not in acute distress.     Appearance: She is well-developed.  HENT:     Head: Normocephalic and atraumatic.  Eyes:     Conjunctiva/sclera: Conjunctivae normal.  Cardiovascular:     Rate and Rhythm: Regular rhythm. Tachycardia present.     Heart sounds: Normal heart sounds. No murmur. No friction rub.  Pulmonary:     Effort: Pulmonary effort is normal. No respiratory distress.     Breath sounds: Rales (bibasilar) present. No wheezing.  Abdominal:     General: There is no distension.     Palpations: Abdomen is soft.     Tenderness: There is no abdominal tenderness.  Musculoskeletal:     Cervical back: Neck supple.     Right lower leg: Edema (3+ pitting) present.     Left lower leg: Edema (3+ pitting)  present.  Skin:    General: Skin is warm.     Capillary Refill: Capillary refill takes less than 2 seconds.  Neurological:     Mental Status: She is alert and oriented to person, place, and time.     Motor: No abnormal muscle tone.       ____________________________________________   LABS (all labs ordered are listed, but only abnormal results are displayed)  Labs Reviewed  CBC WITH DIFFERENTIAL/PLATELET - Abnormal; Notable for the following components:      Result Value   WBC 3.5 (*)    RBC 5.30 (*)    RDW 16.2 (*)    Platelets 76 (*)    All other components within normal limits  BRAIN NATRIURETIC PEPTIDE - Abnormal; Notable for the following components:   B Natriuretic Peptide 203.0 (*)    All other components within normal limits  COMPREHENSIVE METABOLIC PANEL - Abnormal; Notable for the following components:   BUN 30 (*)    Creatinine, Ser 1.56 (*)    Calcium 8.0 (*)    Total Protein 5.8 (*)    Albumin 3.0 (*)    Alkaline Phosphatase 32 (*)    GFR calc non Af Amer 29 (*)    GFR calc Af Amer 34 (*)    All other components within normal limits  TROPONIN I (HIGH SENSITIVITY) - Abnormal; Notable for the following components:   Troponin I (High Sensitivity) 43 (*)    All other components  within normal limits  TROPONIN I (HIGH SENSITIVITY)    ____________________________________________  EKG: Atrial fibrillation, VR 89. QRS narrow at 86. WTc 460. PVC x 2. Noa cute ST elevation or depression.  ________________________________________  RADIOLOGY All imaging, including plain films, CT scans, and ultrasounds, independently reviewed by me, and interpretations confirmed via formal radiology reads.  ED MD interpretation:   CXR: Reviewed, concerning for cardiomegaly with likely b/l edema  Official radiology report(s): DG Chest Portable 1 View  Result Date: 12/31/2019 CLINICAL DATA:  Leg swelling, CHF, shortness of breath. Additional history provided: EXAM: PORTABLE CHEST 1 VIEW COMPARISON:  CT chest 07/12/2019, chest radiograph 12/28/2017 FINDINGS: Unchanged cardiomegaly. Aortic atherosclerosis. Central pulmonary vascular congestion. Ill-defined opacities at the bilateral lung bases. A trace left pleural effusion may be present. No evidence of pneumothorax. No acute bony abnormality. IMPRESSION: Cardiomegaly with central pulmonary vascular congestion. Ill-defined opacities at the bilateral lung bases may reflect atelectasis and/or previously demonstrated scarring. Pneumonia is difficult to definitively exclude. Possible trace left pleural effusion. Aortic atherosclerosis. Electronically Signed   By: Kellie Simmering DO   On: 12/31/2019 07:24    ____________________________________________  PROCEDURES   Procedure(s) performed (including Critical Care):  .1-3 Lead EKG Interpretation Performed by: Duffy Bruce, MD Authorized by: Duffy Bruce, MD     Interpretation: abnormal     ECG rate:  60-100   ECG rate assessment: normal     Rhythm: atrial fibrillation     Ectopy: PAC     Conduction: abnormal   Comments:     Indication: AFib, weakness, CHF exacerbation    ____________________________________________  INITIAL IMPRESSION / MDM / ASSESSMENT AND PLAN / ED  COURSE  As part of my medical decision making, I reviewed the following data within the [place] notes reviewed and incorporated, Old chart reviewed, Notes from prior ED visits, and [place] Controlled Substance Database       *KENNESHA BREWBAKER was evaluated in Emergency Department on 12/31/2019 for the symptoms described in the  history of present illness. She was evaluated in the context of the global COVID-19 pandemic, which necessitated consideration that the patient might be at risk for infection with the SARS-CoV-2 virus that causes COVID-19. Institutional protocols and algorithms that pertain to the evaluation of patients at risk for COVID-19 are in a state of rapid change based on information released by regulatory bodies including the CDC and federal and state organizations. These policies and algorithms were followed during the patient's care in the ED.  Some ED evaluations and interventions may be delayed as a result of limited staffing during the pandemic.*     Medical Decision Making:  86 yo F with PMHx above including HFpEF here with leg swelling, SOB with exertion, >15 lb weight gain. On arrival, VSS but pt markedly edematous on exam. Suspect acute on chronic CHF with marked edema. Will likely need IV Diuresis and admission.  ____________________________________________  FINAL CLINICAL IMPRESSION(S) / ED DIAGNOSES  Final diagnoses:  Peripheral edema     MEDICATIONS GIVEN DURING THIS VISIT:  Medications  furosemide (LASIX) injection 60 mg (has no administration in time range)     ED Discharge Orders    None       Note:  This document was prepared using Dragon voice recognition software and may include unintentional dictation errors.   Duffy Bruce, MD 12/31/19 [phone]

## 2019-12-31 NOTE — ED Notes (Signed)
Admitting MD at bedside.

## 2019-12-31 NOTE — ED Triage Notes (Signed)
BIB [facility] co worsening bilat leg swelling. Pt is on laxis and states she takes it as prescribed. Pt lives alone and has home health visits.

## 2019-12-31 NOTE — Progress Notes (Signed)
Patient had a BP of 79/52 (61). RN to bedside immediatly. Patient is resting in bed, alert and talking. Asymptomatic, denies dizziness, lightheadedness, or drowsiness. Dr. Blaine Hamper notified. Repeat BP was 75/64. MD discontinued all BP medications. Notified SWOT RN (beth) who came to bedside to assess. Agrees that patient is asymptomatic and to monitor patient for now. Charge nurse also notified. Will continue to monitor patient. Instructed patient to notify patient if she does become dizzy or lightheaded. Bed alarm on. Will continue to monitor vitals Q15 until stabilized.   At 1608 BP came up to 102/55.

## 2019-12-31 NOTE — Progress Notes (Addendum)
Patient's nonformulary medication, Ibrutinib, patient stated was in her bag. Spoke to [employer], in pharmacy who stated to bring medication down and it would be dispensed by pharmacy each day. Completed with patient's permission. There were 16 pills when dropped with pharmacy. Will pass on to night shift to administer once pharmacy places order for one time dose.

## 2020-01-01 DIAGNOSIS — I5033 Acute on chronic diastolic (congestive) heart failure: Secondary | ICD-10-CM | POA: Diagnosis not present

## 2020-01-01 LAB — BASIC METABOLIC PANEL
Anion gap: 7 (ref 5–15)
BUN: 32 mg/dL — ABNORMAL HIGH (ref 8–23)
CO2: 28 mmol/L (ref 22–32)
Calcium: 8.4 mg/dL — ABNORMAL LOW (ref 8.9–10.3)
Chloride: 105 mmol/L (ref 98–111)
Creatinine, Ser: 1.82 mg/dL — ABNORMAL HIGH (ref 0.44–1.00)
GFR calc Af Amer: 28 mL/min — ABNORMAL LOW (ref >60)
GFR calc non Af Amer: 24 mL/min — ABNORMAL LOW (ref >60)
Glucose, Bld: 97 mg/dL (ref 70–99)
Potassium: 3.7 mmol/L (ref 3.5–5.1)
Sodium: 140 mmol/L (ref 135–145)

## 2020-01-01 LAB — CBC
HCT: 40.5 % (ref 36.0–46.0)
Hemoglobin: 12.7 g/dL (ref 12.0–15.0)
MCH: 26.1 pg (ref 26.0–34.0)
MCHC: 31.4 g/dL (ref 30.0–36.0)
MCV: 83.2 fL (ref 80.0–100.0)
Platelets: 72 10*3/uL — ABNORMAL LOW (ref 150–400)
RBC: 4.87 MIL/uL (ref 3.87–5.11)
RDW: 16 % — ABNORMAL HIGH (ref 11.5–15.5)
WBC: 3.6 10*3/uL — ABNORMAL LOW (ref 4.0–10.5)
nRBC: 0 % (ref 0.0–0.2)

## 2020-01-01 LAB — LIPID PANEL
Cholesterol: 99 mg/dL (ref 0–200)
HDL: 47 mg/dL (ref >40)
LDL Cholesterol: 42 mg/dL (ref 0–99)
Total CHOL/HDL Ratio: 2.1 RATIO
Triglycerides: 52 mg/dL (ref <150)
VLDL: 10 mg/dL (ref 0–40)

## 2020-01-01 LAB — MAGNESIUM: Magnesium: 2 mg/dL (ref 1.7–2.4)

## 2020-01-01 LAB — HEMOGLOBIN A1C
Hgb A1c MFr Bld: 5.8 % — ABNORMAL HIGH (ref 4.8–5.6)
Mean Plasma Glucose: 119.76 mg/dL

## 2020-01-01 MED ORDER — METOPROLOL SUCCINATE ER 25 MG PO TB24
12.5000 mg | ORAL_TABLET | Freq: Every day | ORAL | Status: DC
  Administered 2020-01-01 – 2020-01-05 (×5): 12.5 mg via ORAL
  Filled 2020-01-01 (×5): qty 1

## 2020-01-01 MED ORDER — METOPROLOL SUCCINATE ER 50 MG PO TB24: 50.0000 mg | ORAL_TABLET | Freq: Every day | ORAL | Status: DC

## 2020-01-01 MED ORDER — FUROSEMIDE 10 MG/ML IJ SOLN
40.0000 mg | Freq: Two times a day (BID) | INTRAMUSCULAR | Status: DC
  Administered 2020-01-01 (×2): 40 mg via INTRAVENOUS
  Filled 2020-01-01 (×2): qty 4

## 2020-01-01 NOTE — Progress Notes (Signed)
PROGRESS NOTE    Kristy Solomon  UUV:[id number] DOB: 12/16/1928 DOA: 12/31/2019 PCP: Jodi Marble, MD      Assessment & Plan:   Principal Problem:   Acute on chronic diastolic CHF (congestive heart failure) (HCC) Active Problems:   Malignant lymphoma, lymphoplasmacytic (HCC)   Thrombocytopenia (HCC)   HLD (hyperlipidemia)   HTN (hypertension)   CKD (chronic kidney disease), stage IV (HCC)   Atrial fibrillation, chronic (HCC)   Elevated troponin   Acute on chronic diastolic CHF: echo on 01/07/4258 showed EF of 60-65%, grade II diastolic dysfunction. Will hold home dose of torsemide. Will continue on IV lasix BID. Strict I/Os. Daily weights. Low salt diet.  Elevated troponin: most likely due to demand ischemia. Continue on tele   Malignant lymphoma, lymphoplasmacytic: continue Ibrutinib. Likely the cause of the leukopenia & thrombocytopenia. F/u with oncology outpatient   Thrombocytopenia: chronic issue. Will continue to monitor   HLD: continue on statin   Essential hypertension: will restart metoprolol. Will hold home dose of amlodipine-benazepril, hydralazine & torsemide to see if pt's BP can tolerate metoprolol & lasix   AKI on CKDIV: Cr is trending up today. Will continue to monitor   Chronic a. fib: not taking anticoagulants at home.  Has bradycardia.  Heart rate 40s-59. Decrease metoprolol dose from 50 to 12.5 mg daily  Generalized weakness: PT/OT consulted    DVT prophylaxis: SCDs secondary to thrombocytopenia Code Status: full  Family Communication: discussed w/ pt's daughter who is at bedside and answered her questions. Pt lives alone but pt's daughter lives in [place]  Disposition Plan: depends on PT/OT recs    Consultants:      Procedures:    Antimicrobials:    Subjective: Pt c/o weakness   Objective: Vitals:   12/31/19 1700 12/31/19 1920 01/01/20 0432 01/01/20 0722  BP: 125/66 (!) 146/64 131/80 (!) 151/72  Pulse: 66 96 (!)  56 (!) 51  Resp: 20 20  20   Temp:  98.6 F (37 C) 98.9 F (37.2 C) 98.3 F (36.8 C)  TempSrc:  Oral Oral Oral  SpO2: 98% 98% 99% 92%  Weight:   93.4 kg   Height:        Intake/Output Summary (Last 24 hours) at 01/01/2020 0744 Last data filed at 12/31/2019 1830 Gross per 24 hour  Intake 480 ml  Output 500 ml  Net -20 ml   Filed Weights   12/31/19 0901 12/31/19 1300 01/01/20 0432  Weight: 95.3 kg 93.8 kg 93.4 kg    Examination:  General exam: Appears calm and comfortable  Respiratory system: diminished breath sounds b/l. No wheezes, rales Cardiovascular system: S1 & S2 +. No rubs, gallops or clicks. B/L LE edema Gastrointestinal system: Abdomen is nondistended, soft and nontender.  Hypoactive bowel sounds heard. Central nervous system: Alert and oriented. Moves all 4 extremities Psychiatry: Judgement and insight appear normal. Flat mood and affect     Data Reviewed: I have personally reviewed following labs and imaging studies  CBC: Recent Labs  Lab 12/31/19 0648 01/01/20 0526  WBC 3.5* 3.6*  NEUTROABS 2.1  --   HGB 13.8 12.7  HCT 43.9 40.5  MCV 82.8 83.2  PLT 76* 72*   Basic Metabolic Panel: Recent Labs  Lab 12/31/19 0718 01/01/20 0526  NA 141 140  K 4.4 3.7  CL 110 105  CO2 23 28  GLUCOSE 88 97  BUN 30* 32*  CREATININE 1.56* 1.82*  CALCIUM 8.0* 8.4*  MG  --  2.0  GFR: Estimated Creatinine Clearance: 23.7 mL/min (A) (by C-G formula based on SCr of 1.82 mg/dL (H)). Liver Function Tests: Recent Labs  Lab 12/31/19 0718  AST 20  ALT 14  ALKPHOS 32*  BILITOT 0.9  PROT 5.8*  ALBUMIN 3.0*   No results for input(s): LIPASE, AMYLASE in the last 168 hours. No results for input(s): AMMONIA in the last 168 hours. Coagulation Profile: No results for input(s): INR, PROTIME in the last 168 hours. Cardiac Enzymes: No results for input(s): CKTOTAL, CKMB, CKMBINDEX, TROPONINI in the last 168 hours. BNP (last 3 results) No results for input(s): PROBNP in  the last 8760 hours. HbA1C: No results for input(s): HGBA1C in the last 72 hours. CBG: No results for input(s): GLUCAP in the last 168 hours. Lipid Profile: Recent Labs    01/01/20 0526  CHOL 99  HDL 47  LDLCALC 42  TRIG 52  CHOLHDL 2.1   Thyroid Function Tests: No results for input(s): TSH, T4TOTAL, FREET4, T3FREE, THYROIDAB in the last 72 hours. Anemia Panel: No results for input(s): VITAMINB12, FOLATE, FERRITIN, TIBC, IRON, RETICCTPCT in the last 72 hours. Sepsis Labs: No results for input(s): PROCALCITON, LATICACIDVEN in the last 168 hours.  Recent Results (from the past 240 hour(s))  Microscopic Examination     Status: Abnormal   Collection Time: 12/24/19  1:54 PM   URINE  Result Value Ref Range Status   WBC, UA 6-10 (A) 0 - 5 /hpf Final   RBC None seen 0 - 2 /hpf Final   Epithelial Cells (non renal) 0-10 0 - 10 /hpf Final   Casts Present (A) None seen /lpf Final   Cast Type Hyaline casts N/A Final   Bacteria, UA Few (A) None seen/Few Final  SARS CORONAVIRUS 2 (TAT 6-24 HRS) Nasopharyngeal Nasopharyngeal Swab     Status: None   Collection Time: 12/31/19  9:47 AM   Specimen: Nasopharyngeal Swab  Result Value Ref Range Status   SARS Coronavirus 2 NEGATIVE NEGATIVE Final    Comment: (NOTE) SARS-CoV-2 target nucleic acids are NOT DETECTED. The SARS-CoV-2 RNA is generally detectable in upper and lower respiratory specimens during the acute phase of infection. Negative results do not preclude SARS-CoV-2 infection, do not rule out co-infections with other pathogens, and should not be used as the sole basis for treatment or other patient management decisions. Negative results must be combined with clinical observations, patient history, and epidemiological information. The expected result is Negative. Fact Sheet for Patients: SugarRoll.be Fact Sheet for Healthcare Providers: https://www.woods-mathews.com/ This test is not yet  approved or cleared by the [place] FDA and  has been authorized for detection and/or diagnosis of SARS-CoV-2 by FDA under an Emergency Use Authorization (EUA). This EUA will remain  in effect (meaning this test can be used) for the duration of the COVID-19 declaration under Section 56 4(b)(1) of the Act, 21 U.S.C. section 360bbb-3(b)(1), unless the authorization is terminated or revoked sooner. Performed at [facility], [place] [address], [place], [place] [address]          Radiology Studies: DG Chest Portable 1 View  Result Date: 12/31/2019 CLINICAL DATA:  Leg swelling, CHF, shortness of breath. Additional history provided: EXAM: PORTABLE CHEST 1 VIEW COMPARISON:  CT chest 07/12/2019, chest radiograph 12/28/2017 FINDINGS: Unchanged cardiomegaly. Aortic atherosclerosis. Central pulmonary vascular congestion. Ill-defined opacities at the bilateral lung bases. A trace left pleural effusion may be present. No evidence of pneumothorax. No acute bony abnormality. IMPRESSION: Cardiomegaly with central pulmonary vascular congestion.  Ill-defined opacities at the bilateral lung bases may reflect atelectasis and/or previously demonstrated scarring. Pneumonia is difficult to definitively exclude. Possible trace left pleural effusion. Aortic atherosclerosis. Electronically Signed   By: Kellie Simmering DO   On: 12/31/2019 07:24   ECHOCARDIOGRAM COMPLETE  Result Date: 12/31/2019    ECHOCARDIOGRAM REPORT   Patient Name:   Kristy Solomon Date of Exam: 12/31/2019 Medical Rec #:  [id number]        Height:       64.0 in Accession #:    [id number]       Weight:       210.0 lb Date of Birth:  [birth year]        BSA:          1.997 m Patient Age:    87 years         BP:           124/79 mmHg Patient Gender: F                HR:           93 bpm. Exam Location:  [facility] Procedure: 2D Echo, Color Doppler and Cardiac Doppler Indications:     I50.31 CHF-Acute Diastolic  History:         Patient has prior history of  Echocardiogram examinations, most                  recent 10/10/2019. Risk Factors:Hypertension and Dyslipidemia.  Sonographer:     Charmayne Sheer RDCS (AE) Referring Phys:  Baker Janus Soledad Gerlach NIU Diagnosing Phys: Nelva Bush MD  Sonographer Comments: Suboptimal subcostal window. IMPRESSIONS  1. Left ventricular ejection fraction, by estimation, is 50 to 55%. The left ventricle has low normal function. The left ventricle has no regional wall motion abnormalities. There is mild left ventricular hypertrophy. Left ventricular diastolic parameters are indeterminate.  2. Right ventricular systolic function is normal. The right ventricular size is normal. Tricuspid regurgitation signal is inadequate for assessing PA pressure.  3. Left atrial size was mildly dilated.  4. Right atrial size was mildly dilated.  5. The mitral valve is abnormal. Mild mitral valve regurgitation. No evidence of mitral stenosis.  6. Tricuspid valve regurgitation is mild to moderate.  7. The aortic valve is tricuspid. Aortic valve regurgitation is not visualized. No aortic stenosis is present.  8. The inferior vena cava is dilated in size with <50% respiratory variability, suggesting right atrial pressure of 15 mmHg. FINDINGS  Left Ventricle: Left ventricular ejection fraction, by estimation, is 50 to 55%. The left ventricle has low normal function. The left ventricle has no regional wall motion abnormalities. The left ventricular internal cavity size was normal in size. There is mild left ventricular hypertrophy. Left ventricular diastolic parameters are indeterminate. Right Ventricle: The right ventricular size is normal. No increase in right ventricular wall thickness. Right ventricular systolic function is normal. Tricuspid regurgitation signal is inadequate for assessing PA pressure. Left Atrium: Left atrial size was mildly dilated. Right Atrium: Right atrial size was mildly dilated. Pericardium: Trivial pericardial effusion is present. Mitral Valve:  The mitral valve is abnormal. Mild mitral valve regurgitation. No evidence of mitral valve stenosis. MV peak gradient, 5.8 mmHg. The mean mitral valve gradient is 2.0 mmHg. Tricuspid Valve: The tricuspid valve is normal in structure. Tricuspid valve regurgitation is mild to moderate. Aortic Valve: The aortic valve is tricuspid. . There is mild thickening of the aortic valve. Aortic valve regurgitation is not visualized. No aortic  stenosis is present. There is mild thickening of the aortic valve. Aortic valve mean gradient measures 5.0 mmHg. Aortic valve peak gradient measures 9.7 mmHg. Aortic valve area, by VTI measures 1.42 cm. Pulmonic Valve: The pulmonic valve was normal in structure. Pulmonic valve regurgitation is not visualized. No evidence of pulmonic stenosis. Aorta: The aortic root is normal in size and structure. Pulmonary Artery: The pulmonary artery is of normal size. Venous: The inferior vena cava is dilated in size with less than 50% respiratory variability, suggesting right atrial pressure of 15 mmHg. IAS/Shunts: The interatrial septum was not well visualized.  LEFT VENTRICLE PLAX 2D LVIDd:         5.21 cm  Diastology LVIDs:         3.85 cm  LV e' lateral:   7.83 cm/s LV PW:         1.15 cm  LV E/e' lateral: 15.6 LV IVS:        0.69 cm  LV e' medial:    6.85 cm/s LVOT diam:     1.90 cm  LV E/e' medial:  17.8 LV SV:         45 LV SV Index:   22 LVOT Area:     2.84 cm  RIGHT VENTRICLE RV Basal diam:  3.58 cm LEFT ATRIUM             Index       RIGHT ATRIUM           Index LA diam:        4.90 cm 2.45 cm/m  RA Area:     19.30 cm LA Vol (A2C):   79.3 ml 39.70 ml/m RA Volume:   45.80 ml  22.93 ml/m LA Vol (A4C):   71.8 ml 35.95 ml/m LA Biplane Vol: 77.1 ml 38.60 ml/m  AORTIC VALVE                    PULMONIC VALVE AV Area (Vmax):    1.91 cm     PV Vmax:       0.95 m/s AV Area (Vmean):   1.81 cm     PV Vmean:      65.600 cm/s AV Area (VTI):     1.42 cm     PV VTI:        0.140 m AV Vmax:            156.00 cm/s  PV Peak grad:  3.6 mmHg AV Vmean:          108.000 cm/s PV Mean grad:  2.0 mmHg AV VTI:            0.315 m AV Peak Grad:      9.7 mmHg AV Mean Grad:      5.0 mmHg LVOT Vmax:         105.00 cm/s LVOT Vmean:        69.000 cm/s LVOT VTI:          0.158 m LVOT/AV VTI ratio: 0.50  AORTA Ao Root diam: 2.90 cm MITRAL VALVE MV Area (PHT): 4.63 cm     SHUNTS MV Peak grad:  5.8 mmHg     Systemic VTI:  0.16 m MV Mean grad:  2.0 mmHg     Systemic Diam: 1.90 cm MV Vmax:       1.20 m/s MV Vmean:      66.3 cm/s MV Decel Time: 164 msec MV E velocity: 122.00 cm/s Nelva Bush MD  Electronically signed by Nelva Bush MD Signature Date/Time: 12/31/2019/1:08:53 PM    Final         Scheduled Meds: . allopurinol  200 mg Oral Daily  . aspirin EC  81 mg Oral Daily  . atorvastatin  20 mg Oral Daily  . cephALEXin  500 mg Oral Q12H  . dextromethorphan-guaiFENesin  1 tablet Oral BID  . gabapentin  300 mg Oral TID  . Ibrutinib  420 mg Oral Daily  . sodium chloride flush  3 mL Intravenous Q12H   Continuous Infusions: . sodium chloride       LOS: 1 day    Time spent: 33 mins     Wyvonnia Dusky, MD [facility] Pager [phone]  If 7PM-7AM, please contact night-coverage [email or web address] 01/01/2020, 7:44 AM

## 2020-01-01 NOTE — Plan of Care (Signed)
Nutrition Education Note  RD consulted for nutrition education regarding CHF.  86 y/o female admitted for acute/chronic CHF with complaints of SOB and B LE edema. History includes HTN, gout, GERD, CHF, and Afib.   RD provided "Low Sodium Nutrition Therapy" handout from the Academy of [employer]etics. Reviewed patient's dietary recall. Provided examples on ways to decrease sodium intake in diet. Discouraged intake of processed foods and use of salt shaker. Encouraged fresh fruits and vegetables as well as whole grain sources of carbohydrates to maximize fiber intake.   RD discussed why it is important for patient to adhere to diet recommendations, and emphasized the role of fluids, foods to avoid, and importance of weighing self daily. Teach back method used.  Expect fair compliance.  Body mass index is 35.36 kg/m. Pt meets criteria for obesity based on current BMI.  Current diet order is 2 gram sodium diet, patient is consuming approximately 100% of meals at this time. Labs and medications reviewed. No further nutrition interventions warranted at this time. RD contact information provided. If additional nutrition issues arise, please re-consult RD.   Knox Saliva MS, RD, LDN Please refer to [facility] for RD and/or RD on-call/weekend/after hours pager

## 2020-01-01 NOTE — Evaluation (Signed)
Physical Therapy Evaluation Patient Details Name: Kristy Solomon MRN: [id number] DOB: 25-Aug-1929 Today's Date: 01/01/2020   History of Present Illness  Pt admitted for acute/chronic CHF with complaints of SOB and B LE edema. History includes HTN, gout, GERD, CHF, and Afib. Pt very active at baseline  Clinical Impression  Pt is a pleasant 86 year old female who was admitted for acute on chronic CHF. Pt performs bed mobility/transfers with independence and ambulation with supervision and no AD. Reports no falls. No SOB symptoms with exertion. Monitored vitals during exertion with ambulation. Pt demonstrates all bed mobility/transfers/ambulation at baseline level. Pt does not require any further PT needs at this time. Pt will be dc in house and does not require follow up. RN aware. Will dc current orders.     Follow Up Recommendations No PT follow up    Equipment Recommendations  None recommended by PT    Recommendations for Other Services       Precautions / Restrictions Precautions Precautions: None Restrictions Weight Bearing Restrictions: No Other Position/Activity Restrictions: telemetry      Mobility  Bed Mobility Overal bed mobility: Independent             General bed mobility comments: safe technique performed  Transfers Overall transfer level: Independent Equipment used: None             General transfer comment: safe technique without assist needed  Ambulation/Gait Ambulation/Gait assistance: Supervision Gait Distance (Feet): 200 Feet Assistive device: None Gait Pattern/deviations: Step-through pattern     General Gait Details: ambulated around RN station with safe technique and reciprocal gait pattern. HR increases and jumps around with activity to 140s 150s. With rest decreases to 98bpm. No fatigue and good gait speed.  Stairs            Wheelchair Mobility    Modified Rankin (Stroke Patients Only)       Balance Overall balance  assessment: Independent                                           Pertinent Vitals/Pain Pain Assessment: No/denies pain    Home Living Family/patient expects to be discharged to:: Private residence Living Arrangements: Alone Available Help at Discharge: Family;Available PRN/intermittently Type of Home: House Home Access: Stairs to enter Entrance Stairs-Rails: Can reach both Entrance Stairs-Number of Steps: 3 Home Layout: One level Home Equipment: Shower seat;None Additional Comments: has elevated toilet seat    Prior Function Level of Independence: Independent         Comments: Pt lives alone and completes all ADLs independently.  Pt does not drive, but has family to check in on her and assist with errands, groceries, etc.  Pt does not use an assistive device and is independent in medication management.  No falls in last 12 months.  Pt enjoys sewing, knitting, and crossword puzzles.     Hand Dominance        Extremity/Trunk Assessment   Upper Extremity Assessment Upper Extremity Assessment: Overall WFL for tasks assessed    Lower Extremity Assessment Lower Extremity Assessment: Overall WFL for tasks assessed    Cervical / Trunk Assessment Cervical / Trunk Assessment: Normal  Communication   Communication: No difficulties  Cognition Arousal/Alertness: Awake/alert Behavior During Therapy: WFL for tasks assessed/performed Overall Cognitive Status: Within Functional Limits for tasks assessed  General Comments: alert and oriented, pleasant and engaged in therapy      General Comments General comments (skin integrity, edema, etc.): pt's HR monitored- elevated to 110s occasionally while on telemetry unit    Exercises Other Exercises Other Exercises: provided education re: fall and safety precautions, self care, functional mobility, home setup/DME, and OT role and plan of care Other Exercises: provided  supervision assist for functional mobility, toileting at [facility], bed mobility, dressing   Assessment/Plan    PT Assessment Patent does not need any further PT services  PT Problem List         PT Treatment Interventions      PT Goals (Current goals can be found in the Care Plan section)  Acute Rehab PT Goals Patient Stated Goal: to go home PT Goal Formulation: All assessment and education complete, DC therapy Time For Goal Achievement: 01/01/20 Potential to Achieve Goals: Good    Frequency     Barriers to discharge        Co-evaluation               AM-PAC PT "6 Clicks" Mobility  Outcome Measure Help needed turning from your back to your side while in a flat bed without using bedrails?: None Help needed moving from lying on your back to sitting on the side of a flat bed without using bedrails?: None Help needed moving to and from a bed to a chair (including a wheelchair)?: None Help needed standing up from a chair using your arms (e.g., wheelchair or bedside chair)?: None Help needed to walk in hospital room?: None Help needed climbing 3-5 steps with a railing? : None 6 Click Score: 24    End of Session   Activity Tolerance: Patient tolerated treatment well Patient left: in chair   PT Visit Diagnosis: Difficulty in walking, not elsewhere classified (R26.2)    Time: 1791-5056 PT Time Calculation (min) (ACUTE ONLY): 16 min   Charges:   PT Evaluation $PT Eval Low Complexity: 1 Low PT Treatments $Gait Training: 8-22 mins        Greggory Stallion, PT, DPT [phone]   Dax Murguia 01/01/2020, 2:31 PM

## 2020-01-01 NOTE — Evaluation (Signed)
Occupational Therapy Evaluation Patient Details Name: Kristy Solomon MRN: [id number] DOB: Jan 15, 1929 Today's Date: 01/01/2020    History of Present Illness Pt is an 86 year old female admitted for acute on chronic congestive heart failure.  Pt's PMH includes malignant lymphoma, thrombocytopenia, hypertension, CKD, atrial fibrillation, and elevated troponin.   Clinical Impression   Pt seen this date for OT evaluation in context of acute on chronic CHF.  Pt was pleasant and agreeable to today's evaluation.  Pt is generally modified independent with all basic ADLs.  OTR provided supervision assist to manage lines and leads while pt completed functional mobility, toileting with BSC, bed mobility, and lower body dressing.  Pt is close to baseline level of functioning, as she lives at home and is independent with all basic ADLs.  She cooks, cleans, bathes, manages medications, and gets dressed independently at home.  She does not drive, but pt has family that checks in and provides assistance when needed.  Plan to discharge Kristy Solomon from skilled acute OT services, as she is close to her baseline level of functioning.  Recommend discharge back to pt's home. Please re-consult should pt's functional status change.    Follow Up Recommendations  No OT follow up    Equipment Recommendations  None recommended by OT    Recommendations for Other Services       Precautions / Restrictions Precautions Precautions: None Restrictions Weight Bearing Restrictions: No Other Position/Activity Restrictions: telemetry      Mobility Bed Mobility Overal bed mobility: Independent             General bed mobility comments: OTR provided assistance for placing pillow under legs and applying SCDs  Transfers Overall transfer level: Modified independent Equipment used: None             General transfer comment: OTR provided supervision assist to manage lines/leads    Balance Overall balance  assessment: Independent                                         ADL either performed or assessed with clinical judgement   ADL Overall ADL's : Modified independent                                       General ADL Comments: Pt is generally mod I in all BADLs.  OTR provided supervision assist in today's evaluation to manage lines/leads.  Pt ambulated to [facility], independent with toileting, bed mobility, functional mobility, dressing     Vision Baseline Vision/History: Wears glasses Wears Glasses: At all times Patient Visual Report: No change from baseline Vision Assessment?: No apparent visual deficits     Perception     Praxis      Pertinent Vitals/Pain Pain Assessment: No/denies pain     Hand Dominance     Extremity/Trunk Assessment Upper Extremity Assessment Upper Extremity Assessment: Overall WFL for tasks assessed   Lower Extremity Assessment Lower Extremity Assessment: Overall WFL for tasks assessed;Defer to PT evaluation   Cervical / Trunk Assessment Cervical / Trunk Assessment: Normal   Communication Communication Communication: No difficulties   Cognition Arousal/Alertness: Awake/alert Behavior During Therapy: WFL for tasks assessed/performed Overall Cognitive Status: Within Functional Limits for tasks assessed  General Comments: alert and oriented, pleasant and engaged in therapy   General Comments  pt's HR monitored- elevated to 110s occasionally while on telemetry unit    Exercises Other Exercises Other Exercises: provided education re: fall and safety precautions, self care, functional mobility, home setup/DME, and OT role and plan of care Other Exercises: provided supervision assist for functional mobility, toileting at [facility], bed mobility, dressing   Shoulder Instructions      Home Living Family/patient expects to be discharged to:: Private residence Living Arrangements:  Alone Available Help at Discharge: Family;Available PRN/intermittently Type of Home: House                       Home Equipment: Shower seat   Additional Comments: has elevated toilet seat      Prior Functioning/Environment Level of Independence: Independent        Comments: Pt lives alone and completes all ADLs independently.  Pt does not drive, but has family to check in on her and assist with errands, groceries, etc.  Pt does not use an assistive device and is independent in medication management.  No falls in last 12 months.  Pt enjoys sewing, knitting, and crossword puzzles.        OT Problem List: Decreased strength;Decreased activity tolerance;Decreased knowledge of use of DME or AE;Cardiopulmonary status limiting activity      OT Treatment/Interventions:      OT Goals(Current goals can be found in the care plan section) Acute Rehab OT Goals Patient Stated Goal: to go home OT Goal Formulation: With patient  OT Frequency:     Barriers to D/C:            Co-evaluation              AM-PAC OT "6 Clicks" Daily Activity     Outcome Measure Help from another person eating meals?: None Help from another person taking care of personal grooming?: None Help from another person toileting, which includes using toliet, bedpan, or urinal?: None Help from another person bathing (including washing, rinsing, drying)?: A Little Help from another person to put on and taking off regular upper body clothing?: None Help from another person to put on and taking off regular lower body clothing?: None 6 Click Score: 23   End of Session Nurse Communication: Other (comment)(Pt's BSC basin fell out after pt urinated- nursing notified to record missing output info)  Activity Tolerance: Patient tolerated treatment well Patient left: in bed;with call bell/phone within reach;with bed alarm set  OT Visit Diagnosis: Other abnormalities of gait and mobility (R26.89);Muscle  weakness (generalized) (M62.81)                Time: 6213-0865 OT Time Calculation (min): 29 min Charges:  OT General Charges $OT Visit: 1 Visit OT Evaluation $OT Eval Low Complexity: 1 Low OT Treatments $Self Care/Home Management : 8-22 mins  Myrtie Hawk Annalisia Ingber, OTR/L 01/01/20, 12:21 PM

## 2020-01-02 ENCOUNTER — Inpatient Hospital Stay: Payer: Medicare HMO

## 2020-01-02 DIAGNOSIS — U071 COVID-19: Secondary | ICD-10-CM

## 2020-01-02 DIAGNOSIS — I5033 Acute on chronic diastolic (congestive) heart failure: Secondary | ICD-10-CM | POA: Diagnosis not present

## 2020-01-02 HISTORY — DX: COVID-19: U07.1

## 2020-01-02 LAB — CBC
HCT: 41 % (ref 36.0–46.0)
Hemoglobin: 12.5 g/dL (ref 12.0–15.0)
MCH: 25.8 pg — ABNORMAL LOW (ref 26.0–34.0)
MCHC: 30.5 g/dL (ref 30.0–36.0)
MCV: 84.5 fL (ref 80.0–100.0)
Platelets: 75 10*3/uL — ABNORMAL LOW (ref 150–400)
RBC: 4.85 MIL/uL (ref 3.87–5.11)
RDW: 16 % — ABNORMAL HIGH (ref 11.5–15.5)
WBC: 3.5 10*3/uL — ABNORMAL LOW (ref 4.0–10.5)
nRBC: 0 % (ref 0.0–0.2)

## 2020-01-02 LAB — BASIC METABOLIC PANEL
Anion gap: 10 (ref 5–15)
BUN: 36 mg/dL — ABNORMAL HIGH (ref 8–23)
CO2: 27 mmol/L (ref 22–32)
Calcium: 8.2 mg/dL — ABNORMAL LOW (ref 8.9–10.3)
Chloride: 102 mmol/L (ref 98–111)
Creatinine, Ser: 1.95 mg/dL — ABNORMAL HIGH (ref 0.44–1.00)
GFR calc Af Amer: 26 mL/min — ABNORMAL LOW (ref >60)
GFR calc non Af Amer: 22 mL/min — ABNORMAL LOW (ref >60)
Glucose, Bld: 98 mg/dL (ref 70–99)
Potassium: 3.7 mmol/L (ref 3.5–5.1)
Sodium: 139 mmol/L (ref 135–145)

## 2020-01-02 MED ORDER — FUROSEMIDE 10 MG/ML IJ SOLN: 40.0000 mg | Freq: Every day | INTRAMUSCULAR | Status: DC

## 2020-01-02 MED ORDER — POLYETHYLENE GLYCOL 3350 17 G PO PACK
17.0000 g | PACK | Freq: Every day | ORAL | Status: DC
  Administered 2020-01-05 – 2020-01-06 (×2): 17 g via ORAL
  Filled 2020-01-02 (×4): qty 1

## 2020-01-02 MED ORDER — DOCUSATE SODIUM 100 MG PO CAPS
200.0000 mg | ORAL_CAPSULE | Freq: Two times a day (BID) | ORAL | Status: DC
  Administered 2020-01-02 – 2020-01-08 (×11): 200 mg via ORAL
  Filled 2020-01-02 (×14): qty 2

## 2020-01-02 NOTE — Progress Notes (Signed)
PROGRESS NOTE    Kristy Solomon  ZMO:[id number] DOB: 12-22-1928 DOA: 12/31/2019 PCP: Jodi Marble, MD      Assessment & Plan:   Principal Problem:   Acute on chronic diastolic CHF (congestive heart failure) (HCC) Active Problems:   Malignant lymphoma, lymphoplasmacytic (HCC)   Thrombocytopenia (HCC)   HLD (hyperlipidemia)   HTN (hypertension)   CKD (chronic kidney disease), stage IV (HCC)   Atrial fibrillation, chronic (HCC)   Elevated troponin   Acute on chronic diastolic CHF: echo on 0/12/5463 showed EF of 60-65%, grade II diastolic dysfunction. Will hold home dose of torsemide. Will hold IV lasix today as Cr is trending up. Repeat CXR neg for heart failure or edema, bibasilar airspace disease left greater than right unchanged may represent atelectasis or possibly pneumonia. No fevers, normal WBCs and no longer requiring supplemental oxygen so will hold off on abxs at this time and will continue to monitor. Strict I/Os. Daily weights. Low salt diet.  Elevated troponin: most likely due to demand ischemia. Continue on tele   Malignant lymphoma, lymphoplasmacytic: continue Ibrutinib. Likely the cause of the leukopenia & thrombocytopenia. F/u with oncology outpatient   Thrombocytopenia: chronic issue. Will continue to monitor   HLD: continue on statin   Essential hypertension: will restart metoprolol. Will hold home dose of amlodipine-benazepril, hydralazine & torsemide to see if pt's BP can tolerate metoprolol & lasix   AKI on CKDIV: Cr is trending up today. Will continue to monitor   Chronic a. fib: not taking anticoagulants at home.  Has bradycardia.  Heart rate 40s-59. Decrease metoprolol dose from 50 to 12.5 mg daily  Generalized weakness: PT/OT recs no PT/OT f/u     DVT prophylaxis: SCDs secondary to thrombocytopenia Code Status: full  Family Communication: discussed w/ pt's daughter who is at bedside and answered her questions. Pt lives alone but pt's  daughter lives in [place]  Disposition Plan: can d/c tomorrow if Cr is trending down    Consultants:      Procedures:    Antimicrobials:    Subjective: Pt c/o b/l LE swelling  Objective: Vitals:   01/01/20 2242 01/01/20 2245 01/02/20 0434 01/02/20 0731  BP:   (!) 129/94 (!) 112/41  Pulse: 97 92 71 (!) 46  Resp: (!) 22 (!) 22  19  Temp:   98.4 F (36.9 C) 98.6 F (37 C)  TempSrc:   Oral   SpO2:  92% 100% 99%  Weight:   92.6 kg   Height:        Intake/Output Summary (Last 24 hours) at 01/02/2020 0812 Last data filed at 01/02/2020 0546 Gross per 24 hour  Intake 700 ml  Output 1350 ml  Net -650 ml   Filed Weights   12/31/19 1300 01/01/20 0432 01/02/20 0434  Weight: 93.8 kg 93.4 kg 92.6 kg    Examination:  General exam: Appears calm and comfortable  Respiratory system: decreased breath sounds b/l. No wheezes, rales Cardiovascular system: S1 & S2 +. No rubs, gallops or clicks. B/L LE edema Gastrointestinal system: Abdomen is nondistended, soft and nontender.  Hypoactive bowel sounds heard. Central nervous system: Alert and oriented. Moves all 4 extremities Psychiatry: Judgement and insight appear normal. Flat mood and affect     Data Reviewed: I have personally reviewed following labs and imaging studies  CBC: Recent Labs  Lab 12/31/19 0648 01/01/20 0526 01/02/20 0604  WBC 3.5* 3.6* 3.5*  NEUTROABS 2.1  --   --   HGB 13.8 12.7 12.5  HCT 43.9 40.5 41.0  MCV 82.8 83.2 84.5  PLT 76* 72* 75*   Basic Metabolic Panel: Recent Labs  Lab 12/31/19 0718 01/01/20 0526 01/02/20 0604  NA 141 140 139  K 4.4 3.7 3.7  CL 110 105 102  CO2 23 28 27   GLUCOSE 88 97 98  BUN 30* 32* 36*  CREATININE 1.56* 1.82* 1.95*  CALCIUM 8.0* 8.4* 8.2*  MG  --  2.0  --    GFR: Estimated Creatinine Clearance: 22 mL/min (A) (by C-G formula based on SCr of 1.95 mg/dL (H)). Liver Function Tests: Recent Labs  Lab 12/31/19 0718  AST 20  ALT 14  ALKPHOS 32*  BILITOT 0.9    PROT 5.8*  ALBUMIN 3.0*   No results for input(s): LIPASE, AMYLASE in the last 168 hours. No results for input(s): AMMONIA in the last 168 hours. Coagulation Profile: No results for input(s): INR, PROTIME in the last 168 hours. Cardiac Enzymes: No results for input(s): CKTOTAL, CKMB, CKMBINDEX, TROPONINI in the last 168 hours. BNP (last 3 results) No results for input(s): PROBNP in the last 8760 hours. HbA1C: Recent Labs    01/01/20 0526  HGBA1C 5.8*   CBG: No results for input(s): GLUCAP in the last 168 hours. Lipid Profile: Recent Labs    01/01/20 0526  CHOL 99  HDL 47  LDLCALC 42  TRIG 52  CHOLHDL 2.1   Thyroid Function Tests: No results for input(s): TSH, T4TOTAL, FREET4, T3FREE, THYROIDAB in the last 72 hours. Anemia Panel: No results for input(s): VITAMINB12, FOLATE, FERRITIN, TIBC, IRON, RETICCTPCT in the last 72 hours. Sepsis Labs: No results for input(s): PROCALCITON, LATICACIDVEN in the last 168 hours.  Recent Results (from the past 240 hour(s))  Microscopic Examination     Status: Abnormal   Collection Time: 12/24/19  1:54 PM   URINE  Result Value Ref Range Status   WBC, UA 6-10 (A) 0 - 5 /hpf Final   RBC None seen 0 - 2 /hpf Final   Epithelial Cells (non renal) 0-10 0 - 10 /hpf Final   Casts Present (A) None seen /lpf Final   Cast Type Hyaline casts N/A Final   Bacteria, UA Few (A) None seen/Few Final  SARS CORONAVIRUS 2 (TAT 6-24 HRS) Nasopharyngeal Nasopharyngeal Swab     Status: None   Collection Time: 12/31/19  9:47 AM   Specimen: Nasopharyngeal Swab  Result Value Ref Range Status   SARS Coronavirus 2 NEGATIVE NEGATIVE Final    Comment: (NOTE) SARS-CoV-2 target nucleic acids are NOT DETECTED. The SARS-CoV-2 RNA is generally detectable in upper and lower respiratory specimens during the acute phase of infection. Negative results do not preclude SARS-CoV-2 infection, do not rule out co-infections with other pathogens, and should not be used as  the sole basis for treatment or other patient management decisions. Negative results must be combined with clinical observations, patient history, and epidemiological information. The expected result is Negative. Fact Sheet for Patients: SugarRoll.be Fact Sheet for Healthcare Providers: https://www.woods-mathews.com/ This test is not yet approved or cleared by the [place] FDA and  has been authorized for detection and/or diagnosis of SARS-CoV-2 by FDA under an Emergency Use Authorization (EUA). This EUA will remain  in effect (meaning this test can be used) for the duration of the COVID-19 declaration under Section 56 4(b)(1) of the Act, 21 U.S.C. section 360bbb-3(b)(1), unless the authorization is terminated or revoked sooner. Performed at [facility], [place] [address] [place], [place] [address]  Radiology Studies: ECHOCARDIOGRAM COMPLETE  Result Date: 12/31/2019    ECHOCARDIOGRAM REPORT   Patient Name:   Kristy Solomon Date of Exam: 12/31/2019 Medical Rec #:  [id number]        Height:       64.0 in Accession #:    [id number]       Weight:       210.0 lb Date of Birth:  20-Jun-1929        BSA:          1.997 m Patient Age:    43 years         BP:           124/79 mmHg Patient Gender: F                HR:           93 bpm. Exam Location:  [facility] Procedure: 2D Echo, Color Doppler and Cardiac Doppler Indications:     I50.31 CHF-Acute Diastolic  History:         Patient has prior history of Echocardiogram examinations, most                  recent 10/10/2019. Risk Factors:Hypertension and Dyslipidemia.  Sonographer:     Charmayne Sheer RDCS (AE) Referring Phys:  Baker Janus Soledad Gerlach NIU Diagnosing Phys: Nelva Bush MD  Sonographer Comments: Suboptimal subcostal window. IMPRESSIONS  1. Left ventricular ejection fraction, by estimation, is 50 to 55%. The left ventricle has low normal function. The left ventricle has no regional wall motion  abnormalities. There is mild left ventricular hypertrophy. Left ventricular diastolic parameters are indeterminate.  2. Right ventricular systolic function is normal. The right ventricular size is normal. Tricuspid regurgitation signal is inadequate for assessing PA pressure.  3. Left atrial size was mildly dilated.  4. Right atrial size was mildly dilated.  5. The mitral valve is abnormal. Mild mitral valve regurgitation. No evidence of mitral stenosis.  6. Tricuspid valve regurgitation is mild to moderate.  7. The aortic valve is tricuspid. Aortic valve regurgitation is not visualized. No aortic stenosis is present.  8. The inferior vena cava is dilated in size with <50% respiratory variability, suggesting right atrial pressure of 15 mmHg. FINDINGS  Left Ventricle: Left ventricular ejection fraction, by estimation, is 50 to 55%. The left ventricle has low normal function. The left ventricle has no regional wall motion abnormalities. The left ventricular internal cavity size was normal in size. There is mild left ventricular hypertrophy. Left ventricular diastolic parameters are indeterminate. Right Ventricle: The right ventricular size is normal. No increase in right ventricular wall thickness. Right ventricular systolic function is normal. Tricuspid regurgitation signal is inadequate for assessing PA pressure. Left Atrium: Left atrial size was mildly dilated. Right Atrium: Right atrial size was mildly dilated. Pericardium: Trivial pericardial effusion is present. Mitral Valve: The mitral valve is abnormal. Mild mitral valve regurgitation. No evidence of mitral valve stenosis. MV peak gradient, 5.8 mmHg. The mean mitral valve gradient is 2.0 mmHg. Tricuspid Valve: The tricuspid valve is normal in structure. Tricuspid valve regurgitation is mild to moderate. Aortic Valve: The aortic valve is tricuspid. . There is mild thickening of the aortic valve. Aortic valve regurgitation is not visualized. No aortic stenosis  is present. There is mild thickening of the aortic valve. Aortic valve mean gradient measures 5.0 mmHg. Aortic valve peak gradient measures 9.7 mmHg. Aortic valve area, by VTI measures 1.42 cm. Pulmonic Valve: The pulmonic valve was normal in  structure. Pulmonic valve regurgitation is not visualized. No evidence of pulmonic stenosis. Aorta: The aortic root is normal in size and structure. Pulmonary Artery: The pulmonary artery is of normal size. Venous: The inferior vena cava is dilated in size with less than 50% respiratory variability, suggesting right atrial pressure of 15 mmHg. IAS/Shunts: The interatrial septum was not well visualized.  LEFT VENTRICLE PLAX 2D LVIDd:         5.21 cm  Diastology LVIDs:         3.85 cm  LV e' lateral:   7.83 cm/s LV PW:         1.15 cm  LV E/e' lateral: 15.6 LV IVS:        0.69 cm  LV e' medial:    6.85 cm/s LVOT diam:     1.90 cm  LV E/e' medial:  17.8 LV SV:         45 LV SV Index:   22 LVOT Area:     2.84 cm  RIGHT VENTRICLE RV Basal diam:  3.58 cm LEFT ATRIUM             Index       RIGHT ATRIUM           Index LA diam:        4.90 cm 2.45 cm/m  RA Area:     19.30 cm LA Vol (A2C):   79.3 ml 39.70 ml/m RA Volume:   45.80 ml  22.93 ml/m LA Vol (A4C):   71.8 ml 35.95 ml/m LA Biplane Vol: 77.1 ml 38.60 ml/m  AORTIC VALVE                    PULMONIC VALVE AV Area (Vmax):    1.91 cm     PV Vmax:       0.95 m/s AV Area (Vmean):   1.81 cm     PV Vmean:      65.600 cm/s AV Area (VTI):     1.42 cm     PV VTI:        0.140 m AV Vmax:           156.00 cm/s  PV Peak grad:  3.6 mmHg AV Vmean:          108.000 cm/s PV Mean grad:  2.0 mmHg AV VTI:            0.315 m AV Peak Grad:      9.7 mmHg AV Mean Grad:      5.0 mmHg LVOT Vmax:         105.00 cm/s LVOT Vmean:        69.000 cm/s LVOT VTI:          0.158 m LVOT/AV VTI ratio: 0.50  AORTA Ao Root diam: 2.90 cm MITRAL VALVE MV Area (PHT): 4.63 cm     SHUNTS MV Peak grad:  5.8 mmHg     Systemic VTI:  0.16 m MV Mean grad:  2.0 mmHg      Systemic Diam: 1.90 cm MV Vmax:       1.20 m/s MV Vmean:      66.3 cm/s MV Decel Time: 164 msec MV E velocity: 122.00 cm/s Nelva Bush MD Electronically signed by Nelva Bush MD Signature Date/Time: 12/31/2019/1:08:53 PM    Final         Scheduled Meds: . allopurinol  200 mg Oral Daily  . aspirin EC  81 mg Oral Daily  .  atorvastatin  20 mg Oral Daily  . cephALEXin  500 mg Oral Q12H  . dextromethorphan-guaiFENesin  1 tablet Oral BID  . gabapentin  300 mg Oral TID  . Ibrutinib  420 mg Oral Daily  . metoprolol succinate  12.5 mg Oral Daily  . sodium chloride flush  3 mL Intravenous Q12H   Continuous Infusions: . sodium chloride       LOS: 2 days    Time spent: 35 mins     Wyvonnia Dusky, MD [facility] Pager [phone]  If 7PM-7AM, please contact night-coverage [email or web address] 01/02/2020, 8:12 AM

## 2020-01-03 DIAGNOSIS — I5033 Acute on chronic diastolic (congestive) heart failure: Secondary | ICD-10-CM | POA: Diagnosis not present

## 2020-01-03 DIAGNOSIS — I509 Heart failure, unspecified: Secondary | ICD-10-CM

## 2020-01-03 LAB — BASIC METABOLIC PANEL
Anion gap: 9 (ref 5–15)
BUN: 34 mg/dL — ABNORMAL HIGH (ref 8–23)
CO2: 25 mmol/L (ref 22–32)
Calcium: 8.2 mg/dL — ABNORMAL LOW (ref 8.9–10.3)
Chloride: 107 mmol/L (ref 98–111)
Creatinine, Ser: 1.76 mg/dL — ABNORMAL HIGH (ref 0.44–1.00)
GFR calc Af Amer: 29 mL/min — ABNORMAL LOW (ref >60)
GFR calc non Af Amer: 25 mL/min — ABNORMAL LOW (ref >60)
Glucose, Bld: 98 mg/dL (ref 70–99)
Potassium: 4 mmol/L (ref 3.5–5.1)
Sodium: 141 mmol/L (ref 135–145)

## 2020-01-03 LAB — CBC
HCT: 44.6 % (ref 36.0–46.0)
Hemoglobin: 13.2 g/dL (ref 12.0–15.0)
MCH: 25.8 pg — ABNORMAL LOW (ref 26.0–34.0)
MCHC: 29.6 g/dL — ABNORMAL LOW (ref 30.0–36.0)
MCV: 87.1 fL (ref 80.0–100.0)
Platelets: 49 10*3/uL — ABNORMAL LOW (ref 150–400)
RBC: 5.12 MIL/uL — ABNORMAL HIGH (ref 3.87–5.11)
RDW: 16.1 % — ABNORMAL HIGH (ref 11.5–15.5)
WBC: 3.8 10*3/uL — ABNORMAL LOW (ref 4.0–10.5)
nRBC: 0 % (ref 0.0–0.2)

## 2020-01-03 LAB — CBC WITH DIFFERENTIAL/PLATELET
Abs Immature Granulocytes: 0.01 10*3/uL (ref 0.00–0.07)
Basophils Absolute: 0.1 10*3/uL (ref 0.0–0.1)
Basophils Relative: 2 %
Eosinophils Absolute: 0.1 10*3/uL (ref 0.0–0.5)
Eosinophils Relative: 2 %
HCT: 42.1 % (ref 36.0–46.0)
Hemoglobin: 12.7 g/dL (ref 12.0–15.0)
Immature Granulocytes: 0 %
Lymphocytes Relative: 43 %
Lymphs Abs: 1.8 10*3/uL (ref 0.7–4.0)
MCH: 25.9 pg — ABNORMAL LOW (ref 26.0–34.0)
MCHC: 30.2 g/dL (ref 30.0–36.0)
MCV: 85.7 fL (ref 80.0–100.0)
Monocytes Absolute: 0.5 10*3/uL (ref 0.1–1.0)
Monocytes Relative: 13 %
Neutro Abs: 1.6 10*3/uL — ABNORMAL LOW (ref 1.7–7.7)
Neutrophils Relative %: 40 %
Platelets: 84 10*3/uL — ABNORMAL LOW (ref 150–400)
RBC: 4.91 MIL/uL (ref 3.87–5.11)
RDW: 15.9 % — ABNORMAL HIGH (ref 11.5–15.5)
WBC: 4.1 10*3/uL (ref 4.0–10.5)
nRBC: 0 % (ref 0.0–0.2)

## 2020-01-03 MED ORDER — HYDRALAZINE HCL 50 MG PO TABS
50.0000 mg | ORAL_TABLET | Freq: Three times a day (TID) | ORAL | Status: DC
  Administered 2020-01-03 – 2020-01-04 (×3): 50 mg via ORAL
  Filled 2020-01-03 (×3): qty 1

## 2020-01-03 NOTE — Care Management Important Message (Signed)
Important Message  Patient Details  Name: Kristy Solomon MRN: [id number] Date of Birth: [birth year]   Medicare Important Message Given:  Yes     [name] 01/03/2020, 12:19 PM

## 2020-01-03 NOTE — Care Management Obs Status (Signed)
[facility] NOTIFICATION   Patient Details  Name: Kristy Solomon MRN: [id number] Date of Birth: [birth year]   Medicare Observation Status Notification Given:  Yes    Shelbie Ammons, RN 01/03/2020, 2:50 PM

## 2020-01-03 NOTE — Progress Notes (Signed)
Patient ambulated around the nurses station on rm air. Patients oxygen saturations remained above 98%. Patient had no complaints, tolerated well.

## 2020-01-03 NOTE — Progress Notes (Signed)
PROGRESS NOTE    Kristy Solomon  IDP:[id number] DOB: [birth year] DOA: 12/31/2019 PCP: Jodi Marble, MD   Brief Narrative:  HPI: Kristy Solomon is a 86 y.o. female with medical history significant of hypertension, hyperlipidemia, gout, GERD, lymphoma, CKD-4, thrombocytopenia, CHF, atrial fibrillation not on anticoagulants, who presents with shortness breath and bilateral leg edema.  Patient states that she has been having shortness of breath in the past several days, which has been progressively worsening.  She has orthopnea.  Shortness breath is getting worse with minimal exertion.  She also has worsening bilateral leg edema.  Her PCP increased her torsemide recently dose without significant improvement.  Patient has mild dry cough, but no fever or chills.  Denies nausea, vomiting, diarrhea, abdominal pain, symptoms of UTI or unilateral weakness.  4/2: Patient seen and examined.  Clinically feels well.  Kidney function improving.  Platelet count acutely dropped to 49 from 75.  No bleeding noted.  No abnormal bruising.   Assessment & Plan:   Principal Problem:   Acute on chronic diastolic CHF (congestive heart failure) (HCC) Active Problems:   Malignant lymphoma, lymphoplasmacytic (HCC)   Thrombocytopenia (HCC)   HLD (hyperlipidemia)   HTN (hypertension)   CKD (chronic kidney disease), stage IV (HCC)   Atrial fibrillation, chronic (HCC)   Elevated troponin  Acute on chronic diastolic CHF echo on 01/04/3153 showed EF of 60-65%, grade II diastolic dysfunction.  Will hold home dose of torsemide.  IV Lasix held as creatinine was trending up Repeat chest x-ray negative for fluid overload Not requiring supplemental oxygen Plan: Monitor off diuretics today If kidney function remains stable can restart home torsemide tomorrow 01/04/20 in preparation for discharge Continue strict ins and outs and daily weights Continue low-salt diet   Thrombocytopenia:  This is a known chronic  issue however patient had acute worsening thrombocytopenia today And platelet count this morning was 49, down from 75 Upon review of historical data this is below the patient's baseline even back to 2017 Unclear etiology, possible artifact versus lab error Update: On repeat CBC.  Count up to 84.  Suspect lab error.   Elevated troponin:most likely due to demand ischemia. Continue on tele   Malignant lymphoma, lymphoplasmacytic:  continue Ibrutinib.  Likely the cause of the leukopenia & thrombocytopenia.  F/u with oncology outpatient    HLD: continue on statin   Essential hypertension:  Continue metoprolol Amlodipine/benazapril on hold Restart home hydralazine Restart home torsemide tomorrow in preparation for discharge if kidney function continues to improve  AKI on CKD stage IV: Creatinine again stabilized today Repeat kidney function in a.m. If creatinine continues to improve consider restarting amlodipine/benazepril and home torsemide preparation for discharge  Chronic a. fib: not taking anticoagulants at home.  Has bradycardia. Heart rate 40s-59.  Decrease metoprolol dose from 50 to 12.5 mg daily  Generalized weakness: PT/OT recs no PT/OT f/u     DVT prophylaxis: SCDs Code Status: Full Family Communication: Left VM for daughter [name] [phone] on 01/03/2020 Disposition Plan: Anticipate return to previous home environment.  Physical therapy does not recommend any follow-up in their standpoint.  Will monitor in house overnight.  If kidney function improved we will restart home ACE inhibitor and diuretic in preparation for discharge tomorrow morning.   Consultants:   none  Procedures:   none  Antimicrobials:   none   Subjective: Patient seen and examined No complaints Symptoms improved over interval  Objective: Vitals:   01/03/20 0405 01/03/20 0815 01/03/20 0857 01/03/20  1147  BP:  (!) 147/92 106/81 (!) 126/96  Pulse: 62 (!) 50 99 70  Resp:  16 20 18 17   Temp: 98.4 F (36.9 C) 97.9 F (36.6 C) 98 F (36.7 C) 98.1 F (36.7 C)  TempSrc: Oral     SpO2: 95% 100% 100% 96%  Weight:      Height:        Intake/Output Summary (Last 24 hours) at 01/03/2020 1338 Last data filed at 01/03/2020 0950 Gross per 24 hour  Intake 720 ml  Output 200 ml  Net 520 ml   Filed Weights   01/01/20 0432 01/02/20 0434 01/03/20 0400  Weight: 93.4 kg 92.6 kg 93.2 kg    Examination:  General exam: Appears calm and comfortable  Respiratory system: Mild bibasilar crackles, no wheeze, normal work of breathing Cardiovascular system: S1 & S2 heard, RRR. No JVD, murmurs, rubs, gallops or clicks. No pedal edema. Gastrointestinal system: Abdomen is nondistended, soft and nontender. No organomegaly or masses felt. Normal bowel sounds heard. Central nervous system: Alert and oriented. No focal neurological deficits. Extremities: Symmetric 5 x 5 power. Skin: No rashes, lesions or ulcers Psychiatry: Judgement and insight appear normal. Mood & affect appropriate.     Data Reviewed: I have personally reviewed following labs and imaging studies  CBC: Recent Labs  Lab 12/31/19 0648 01/01/20 0526 01/02/20 0604 01/03/20 0351  WBC 3.5* 3.6* 3.5* 3.8*  NEUTROABS 2.1  --   --   --   HGB 13.8 12.7 12.5 13.2  HCT 43.9 40.5 41.0 44.6  MCV 82.8 83.2 84.5 87.1  PLT 76* 72* 75* 49*   Basic Metabolic Panel: Recent Labs  Lab 12/31/19 0718 01/01/20 0526 01/02/20 0604 01/03/20 0351  NA 141 140 139 141  K 4.4 3.7 3.7 4.0  CL 110 105 102 107  CO2 23 28 27 25   GLUCOSE 88 97 98 98  BUN 30* 32* 36* 34*  CREATININE 1.56* 1.82* 1.95* 1.76*  CALCIUM 8.0* 8.4* 8.2* 8.2*  MG  --  2.0  --   --    GFR: Estimated Creatinine Clearance: 24.5 mL/min (A) (by C-G formula based on SCr of 1.76 mg/dL (H)). Liver Function Tests: Recent Labs  Lab 12/31/19 0718  AST 20  ALT 14  ALKPHOS 32*  BILITOT 0.9  PROT 5.8*  ALBUMIN 3.0*   No results for input(s): LIPASE,  AMYLASE in the last 168 hours. No results for input(s): AMMONIA in the last 168 hours. Coagulation Profile: No results for input(s): INR, PROTIME in the last 168 hours. Cardiac Enzymes: No results for input(s): CKTOTAL, CKMB, CKMBINDEX, TROPONINI in the last 168 hours. BNP (last 3 results) No results for input(s): PROBNP in the last 8760 hours. HbA1C: Recent Labs    01/01/20 0526  HGBA1C 5.8*   CBG: No results for input(s): GLUCAP in the last 168 hours. Lipid Profile: Recent Labs    01/01/20 0526  CHOL 99  HDL 47  LDLCALC 42  TRIG 52  CHOLHDL 2.1   Thyroid Function Tests: No results for input(s): TSH, T4TOTAL, FREET4, T3FREE, THYROIDAB in the last 72 hours. Anemia Panel: No results for input(s): VITAMINB12, FOLATE, FERRITIN, TIBC, IRON, RETICCTPCT in the last 72 hours. Sepsis Labs: No results for input(s): PROCALCITON, LATICACIDVEN in the last 168 hours.  Recent Results (from the past 240 hour(s))  Microscopic Examination     Status: Abnormal   Collection Time: 12/24/19  1:54 PM   URINE  Result Value Ref Range Status  WBC, UA 6-10 (A) 0 - 5 /hpf Final   RBC None seen 0 - 2 /hpf Final   Epithelial Cells (non renal) 0-10 0 - 10 /hpf Final   Casts Present (A) None seen /lpf Final   Cast Type Hyaline casts N/A Final   Bacteria, UA Few (A) None seen/Few Final  SARS CORONAVIRUS 2 (TAT 6-24 HRS) Nasopharyngeal Nasopharyngeal Swab     Status: None   Collection Time: 12/31/19  9:47 AM   Specimen: Nasopharyngeal Swab  Result Value Ref Range Status   SARS Coronavirus 2 NEGATIVE NEGATIVE Final    Comment: (NOTE) SARS-CoV-2 target nucleic acids are NOT DETECTED. The SARS-CoV-2 RNA is generally detectable in upper and lower respiratory specimens during the acute phase of infection. Negative results do not preclude SARS-CoV-2 infection, do not rule out co-infections with other pathogens, and should not be used as the sole basis for treatment or other patient management  decisions. Negative results must be combined with clinical observations, patient history, and epidemiological information. The expected result is Negative. Fact Sheet for Patients: SugarRoll.be Fact Sheet for Healthcare Providers: https://www.woods-mathews.com/ This test is not yet approved or cleared by the [place] FDA and  has been authorized for detection and/or diagnosis of SARS-CoV-2 by FDA under an Emergency Use Authorization (EUA). This EUA will remain  in effect (meaning this test can be used) for the duration of the COVID-19 declaration under Section 56 4(b)(1) of the Act, 21 U.S.C. section 360bbb-3(b)(1), unless the authorization is terminated or revoked sooner. Performed at [facility], [place] [address] [place], [place] [address]          Radiology Studies: DG Chest Port 1 View  Result Date: 01/02/2020 CLINICAL DATA:  Short of breath fluid overload EXAM: PORTABLE CHEST 1 VIEW COMPARISON:  12/31/2019 FINDINGS: Cardiac enlargement. Negative for heart failure or edema. No significant pleural effusion. Left lower lobe atelectasis/infiltrate unchanged. Mild right lower lobe airspace disease unchanged. IMPRESSION: Negative for heart failure or edema. Bibasilar airspace disease left greater than right unchanged may represent atelectasis or possibly pneumonia Electronically Signed   By: Franchot Gallo M.D.   On: 01/02/2020 08:34        Scheduled Meds: . allopurinol  200 mg Oral Daily  . aspirin EC  81 mg Oral Daily  . atorvastatin  20 mg Oral Daily  . cephALEXin  500 mg Oral Q12H  . dextromethorphan-guaiFENesin  1 tablet Oral BID  . docusate sodium  200 mg Oral BID  . Ibrutinib  420 mg Oral Daily  . metoprolol succinate  12.5 mg Oral Daily  . polyethylene glycol  17 g Oral Daily  . sodium chloride flush  3 mL Intravenous Q12H   Continuous Infusions: . sodium chloride       LOS: 3 days    Time spent: 35  minutes    Sidney Ace, MD [facility] Pager [phone]  If 7PM-7AM, please contact night-coverage 01/03/2020, 1:38 PM

## 2020-01-04 DIAGNOSIS — I5033 Acute on chronic diastolic (congestive) heart failure: Secondary | ICD-10-CM | POA: Diagnosis not present

## 2020-01-04 LAB — BASIC METABOLIC PANEL
Anion gap: 7 (ref 5–15)
BUN: 30 mg/dL — ABNORMAL HIGH (ref 8–23)
CO2: 28 mmol/L (ref 22–32)
Calcium: 8.3 mg/dL — ABNORMAL LOW (ref 8.9–10.3)
Chloride: 105 mmol/L (ref 98–111)
Creatinine, Ser: 1.61 mg/dL — ABNORMAL HIGH (ref 0.44–1.00)
GFR calc Af Amer: 33 mL/min — ABNORMAL LOW (ref >60)
GFR calc non Af Amer: 28 mL/min — ABNORMAL LOW (ref >60)
Glucose, Bld: 102 mg/dL — ABNORMAL HIGH (ref 70–99)
Potassium: 4.1 mmol/L (ref 3.5–5.1)
Sodium: 140 mmol/L (ref 135–145)

## 2020-01-04 LAB — TSH: TSH: 3.817 u[IU]/mL (ref 0.350–4.500)

## 2020-01-04 MED ORDER — DILTIAZEM HCL 30 MG PO TABS
30.0000 mg | ORAL_TABLET | Freq: Three times a day (TID) | ORAL | Status: DC
  Administered 2020-01-04 – 2020-01-05 (×4): 30 mg via ORAL
  Filled 2020-01-04 (×4): qty 1

## 2020-01-04 MED ORDER — GABAPENTIN 100 MG PO CAPS
200.0000 mg | ORAL_CAPSULE | Freq: Three times a day (TID) | ORAL | Status: DC
  Administered 2020-01-04 – 2020-01-09 (×16): 200 mg via ORAL
  Filled 2020-01-04 (×16): qty 2

## 2020-01-04 MED ORDER — HYDRALAZINE HCL 20 MG/ML IJ SOLN: 5.0000 mg | INTRAMUSCULAR | Status: DC | PRN

## 2020-01-04 MED ORDER — HYDRALAZINE HCL 50 MG PO TABS: 50.0000 mg | ORAL_TABLET | Freq: Three times a day (TID) | ORAL | Status: DC

## 2020-01-04 NOTE — TOC Initial Note (Signed)
Transition of Care [facility]) - Initial/Assessment Note    Patient Details  Name: Kristy Solomon MRN: [id number] Date of Birth: Aug 09, 1929  [facility] of [facility] CM/SW Contact:    Elliot Gurney [place], [place] Phone Number: 01/04/2020, 10:03 AM  Clinical Narrative:                 Patient is a 86 year old female who presented with shortness of breath and bilateral leg edema. Patient reports having a scale and blood pressure cuff at home. This Education officer, museum confirmed that patient weighs self and monitors her blood pressure daily. This Education officer, museum reinforced that she continues this practice as well as to when to notify her physician.Patient confirmed that she follows up with the [facility] in [place].  No further needs TOC needs at this time. Please re-consult if any needs arise in the future.    Josilyn Shippee, LCSW Clinical Social Work [phone]   Expected Discharge Plan: Home/Self Care Barriers to Discharge: No Barriers Identified   Patient Goals and CMS Choice Patient states their goals for this hospitalization and ongoing recovery are:: "to go home today"      Expected Discharge Plan and Services Expected Discharge Plan: Home/Self Care In-house Referral: Clinical Social Work Discharge Planning Services: Mobile Meals, HF Clinic(Heart failure clinic [place]) Post Acute Care Choice: NA Living arrangements for the past 2 months: Single Family Home                 DME Arranged: N/A         HH Arranged: NA          Prior Living Arrangements/Services Living arrangements for the past 2 months: Single Family Home Lives with:: Self Patient language and need for interpreter reviewed:: No Do you feel safe going back to the place where you live?: Yes      Need for Family Participation in Patient Care: No (Comment) Care giver support system in place?: Yes (comment) Current home services: DME(walker, but is rarely iused) Criminal Activity/Legal Involvement  Pertinent to Current Situation/Hospitalization: No - Comment as needed  Activities of Daily Living Home Assistive Devices/Equipment: Eyeglasses, Shower chair without back, Environmental consultant (specify type) ADL Screening (condition at time of admission) Patient's cognitive ability adequate to safely complete daily activities?: Yes Is the patient deaf or have difficulty hearing?: No Does the patient have difficulty seeing, even when wearing glasses/contacts?: No Does the patient have difficulty concentrating, remembering, or making decisions?: No Patient able to express need for assistance with ADLs?: Yes Does the patient have difficulty dressing or bathing?: No Independently performs ADLs?: Yes (appropriate for developmental age) Does the patient have difficulty walking or climbing stairs?: No Weakness of Legs: None Weakness of Arms/Hands: None  Permission Sought/Granted Permission sought to share information with : Facility Sport and exercise psychologist Permission granted to share information with : Yes, Verbal Permission Granted        Permission granted to share info w Relationship: Julieanna Geraci  daughter  Permission granted to share info w Contact Information: [phone]  Emotional Assessment Appearance:: Appears stated age Attitude/Demeanor/Rapport: Engaged Affect (typically observed): Accepting, Appropriate Orientation: : Oriented to Self, Oriented to Place, Oriented to  Time, Oriented to Situation Alcohol / Substance Use: Not Applicable Psych Involvement: No (comment)  Admission diagnosis:  Peripheral edema [R60.9] Acute on chronic diastolic (congestive) heart failure (HCC) [I50.33] Acute on chronic congestive heart failure, unspecified heart failure type (HCC) [I50.9] Acute decompensated heart failure ([place]) [I50.9] Patient Active Problem List  Diagnosis Date Noted  . Acute decompensated heart failure ([place]) 01/03/2020  . Acute on chronic diastolic CHF (congestive heart failure) ([place])  12/31/2019  . CKD (chronic kidney disease), stage IV ([place]) 12/31/2019  . Atrial fibrillation, chronic ([place]) 12/31/2019  . Elevated troponin 12/31/2019  . Renal insufficiency 07/28/2019  . Bilateral edema of lower extremity 05/07/2019  . HLD (hyperlipidemia) 09/18/2018  . HTN (hypertension) 09/18/2018  . GERD (gastroesophageal reflux disease) 09/18/2018  . Back pain 09/18/2018  . Bradycardia 09/18/2018  . Bigeminy 09/18/2018  . Nephrolithiasis 01/11/2018  . Goals of care, counseling/discussion 12/28/2017  . Gastroenteritis 12/28/2017  . B12 deficiency 03/18/2016  . Folate deficiency 03/18/2016  . Iron deficiency anemia 03/18/2016  . Pneumonia 03/17/2016  . Pressure ulcer 03/17/2016  . Thrombocytopenia ([place]) 07/04/2014  . Acute renal failure ([place]) 07/04/2014  . Hyperuricemia 07/04/2014  . 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 Malignant lymphoma, lymphoplasmacytic ([place]) 12/27/2013  . Calculi, ureter 07/03/2012  . Frank hematuria 07/03/2012  . Hydronephrosis 07/03/2012  . 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 Calculus of kidney 07/02/2012  . Nonspecific finding on examination of urine 07/02/2012   PCP:  Jodi Marble, MD Pharmacy:   [facility] [place], [place] - [address] AT [facility] [address] [place] [address] Phone: [phone] Fax: [place], [place] - [address] [place] [address] Phone: [phone] Fax: [phone]     Social Determinants of Health (SDOH) Interventions    Readmission Risk Interventions No flowsheet data found.

## 2020-01-04 NOTE — Progress Notes (Signed)
PROGRESS NOTE    Kristy Solomon  HLK:[id number] DOB: Jun 04, 1929 DOA: 12/31/2019 PCP: Jodi Marble, MD   Brief Narrative:  HPI: Kristy Solomon is a 86 y.o. female with medical history significant of hypertension, hyperlipidemia, gout, GERD, lymphoma, CKD-4, thrombocytopenia, CHF, atrial fibrillation not on anticoagulants, who presents with shortness breath and bilateral leg edema.  Patient states that she has been having shortness of breath in the past several days, which has been progressively worsening.  She has orthopnea.  Shortness breath is getting worse with minimal exertion.  She also has worsening bilateral leg edema.  Her PCP increased her torsemide recently dose without significant improvement.  Patient has mild dry cough, but no fever or chills.  Denies nausea, vomiting, diarrhea, abdominal pain, symptoms of UTI or unilateral weakness.  4/3: Patient seen and examined.  Clinically feels well.  Kidney function improving.   Platelet count proved 84, No bleeding noted.  No abnormal bruising. Heart rate was very high 150s to 160s while ambulation but patient remained asymptomatic  Assessment & Plan:   Principal Problem:   Acute on chronic diastolic CHF (congestive heart failure) (HCC) Active Problems:   Malignant lymphoma, lymphoplasmacytic (HCC)   Thrombocytopenia (HCC)   HLD (hyperlipidemia)   HTN (hypertension)   CKD (chronic kidney disease), stage IV (HCC)   Atrial fibrillation, chronic (HCC)   Elevated troponin   Acute decompensated heart failure (HCC)  Acute on chronic diastolic CHF echo on 04/04/4286 showed EF of 60-65%, grade II diastolic dysfunction.  Will hold home dose of torsemide.  IV Lasix held as creatinine was trending up Repeat chest x-ray negative for fluid overload Not requiring supplemental oxygen Plan: Monitor off diuretics today If kidney function remains stable can restart home torsemide tomorrow 01/05/20 in preparation for discharge Continue  strict ins and outs and daily weights Continue low-salt diet   Thrombocytopenia:  This is a known chronic issue however patient had acute worsening thrombocytopenia today And platelet count this morning was 49, down from 75 Upon review of historical data this is below the patient's baseline even back to 2017 Unclear etiology, possible artifact versus lab error Update: On repeat CBC.  Count up to 84.  Suspect lab error.   Elevated troponin:most likely due to demand ischemia. Continue on tele   Malignant lymphoma, lymphoplasmacytic:  continue Ibrutinib.  Likely the cause of the leukopenia & thrombocytopenia.  F/u with oncology outpatient    HLD: continue on statin   Essential hypertension:  Creased metoprolol from 50 to 12.5 mg p.o. daily due to soft blood pressure  Amlodipine/benazapril/hydralazine on hold Restart home torsemide tomorrow in preparation for discharge if kidney function continues to improve  AKI on CKD stage IV: Creatinine again stabilized today Repeat kidney function in a.m.   Chronic a. fib: not taking anticoagulants at home.  Has bradycardia. Heart rate 150-160 while ambulation blood pressure soft Decrease metoprolol dose from 50 to 12.5 mg daily 4/3 started Cardizem 30 mg p.o. every 6 hourly Check a TSH level  Generalized weakness: seen by PT/OT recs no PT/OT f/u, plan to discharge home/self-care     DVT prophylaxis: SCDs Code Status: Full Family Communication: Left VM for daughter [name] [phone] on 01/03/2020 Disposition Plan: Anticipate return to previous home environment.  Physical therapy does not recommend any follow-up in their standpoint.  Will monitor in house overnight.  Renal function stable, patient still has soft blood pressure and tachycardia heart rate 150s 160s on ambulation. Started Cardizem today, discontinued hydralazine.  Will reassess tomorrow a.m. and then plan for disposition.    Consultants:    none  Procedures:   none  Antimicrobials:   none   Subjective: Patient seen and examined at bedside, no overnight issues.  Patient denies any complaints.  Patient ambulated in the hall with RN, found to have elevated heart rate 150s to 160s but without any symptoms.  Medication has been changed as above, will reassess tomorrow a.m. Patient denied any chest pain, no shortness of breath and no palpitations.   Objective: Vitals:   01/04/20 0523 01/04/20 0803 01/04/20 1200 01/04/20 1407  BP: (!) 149/69 104/72 98/78 108/62  Pulse: (!) 51 90 74 90  Resp: 20 18 18    Temp: 98.1 F (36.7 C) 98.2 F (36.8 C) 98.7 F (37.1 C)   TempSrc: Oral Oral Oral   SpO2: 100% 99% 100%   Weight:      Height:        Intake/Output Summary (Last 24 hours) at 01/04/2020 1549 Last data filed at 01/04/2020 1024 Gross per 24 hour  Intake 240 ml  Output 0 ml  Net 240 ml   Filed Weights   01/02/20 0434 01/03/20 0400 01/04/20 0241  Weight: 92.6 kg 93.2 kg 93.5 kg    Examination:  General exam: Appears calm and comfortable  Respiratory system: Mild bibasilar crackles, no wheeze, normal work of breathing Cardiovascular system: S1 & S2 heard, RRR. No JVD, murmurs, rubs, gallops or clicks. No pedal edema. Gastrointestinal system: Abdomen is nondistended, soft and nontender. No organomegaly or masses felt. Normal bowel sounds heard. Central nervous system: Alert and oriented. No focal neurological deficits. Extremities: Symmetric 5 x 5 power. Skin: No rashes, lesions or ulcers Psychiatry: Judgement and insight appear normal. Mood & affect appropriate.     Data Reviewed: I have personally reviewed following labs and imaging studies  CBC: Recent Labs  Lab 12/31/19 0648 01/01/20 0526 01/02/20 0604 01/03/20 0351 01/03/20 1413  WBC 3.5* 3.6* 3.5* 3.8* 4.1  NEUTROABS 2.1  --   --   --  1.6*  HGB 13.8 12.7 12.5 13.2 12.7  HCT 43.9 40.5 41.0 44.6 42.1  MCV 82.8 83.2 84.5 87.1 85.7  PLT 76*  72* 75* 49* 84*   Basic Metabolic Panel: Recent Labs  Lab 12/31/19 0718 01/01/20 0526 01/02/20 0604 01/03/20 0351 01/04/20 0324  NA 141 140 139 141 140  K 4.4 3.7 3.7 4.0 4.1  CL 110 105 102 107 105  CO2 23 28 27 25 28   GLUCOSE 88 97 98 98 102*  BUN 30* 32* 36* 34* 30*  CREATININE 1.56* 1.82* 1.95* 1.76* 1.61*  CALCIUM 8.0* 8.4* 8.2* 8.2* 8.3*  MG  --  2.0  --   --   --    GFR: Estimated Creatinine Clearance: 26.8 mL/min (A) (by C-G formula based on SCr of 1.61 mg/dL (H)). Liver Function Tests: Recent Labs  Lab 12/31/19 0718  AST 20  ALT 14  ALKPHOS 32*  BILITOT 0.9  PROT 5.8*  ALBUMIN 3.0*   No results for input(s): LIPASE, AMYLASE in the last 168 hours. No results for input(s): AMMONIA in the last 168 hours. Coagulation Profile: No results for input(s): INR, PROTIME in the last 168 hours. Cardiac Enzymes: No results for input(s): CKTOTAL, CKMB, CKMBINDEX, TROPONINI in the last 168 hours. BNP (last 3 results) No results for input(s): PROBNP in the last 8760 hours. HbA1C: No results for input(s): HGBA1C in the last 72 hours. CBG: No results for input(s):  GLUCAP in the last 168 hours. Lipid Profile: No results for input(s): CHOL, HDL, LDLCALC, TRIG, CHOLHDL, LDLDIRECT in the last 72 hours. Thyroid Function Tests: Recent Labs    01/04/20 0324  TSH 3.817   Anemia Panel: No results for input(s): VITAMINB12, FOLATE, FERRITIN, TIBC, IRON, RETICCTPCT in the last 72 hours. Sepsis Labs: No results for input(s): PROCALCITON, LATICACIDVEN in the last 168 hours.  Recent Results (from the past 240 hour(s))  SARS CORONAVIRUS 2 (TAT 6-24 HRS) Nasopharyngeal Nasopharyngeal Swab     Status: None   Collection Time: 12/31/19  9:47 AM   Specimen: Nasopharyngeal Swab  Result Value Ref Range Status   SARS Coronavirus 2 NEGATIVE NEGATIVE Final    Comment: (NOTE) SARS-CoV-2 target nucleic acids are NOT DETECTED. The SARS-CoV-2 RNA is generally detectable in upper and  lower respiratory specimens during the acute phase of infection. Negative results do not preclude SARS-CoV-2 infection, do not rule out co-infections with other pathogens, and should not be used as the sole basis for treatment or other patient management decisions. Negative results must be combined with clinical observations, patient history, and epidemiological information. The expected result is Negative. Fact Sheet for Patients: SugarRoll.be Fact Sheet for Healthcare Providers: https://www.woods-mathews.com/ This test is not yet approved or cleared by the [place] FDA and  has been authorized for detection and/or diagnosis of SARS-CoV-2 by FDA under an Emergency Use Authorization (EUA). This EUA will remain  in effect (meaning this test can be used) for the duration of the COVID-19 declaration under Section 56 4(b)(1) of the Act, 21 U.S.C. section 360bbb-3(b)(1), unless the authorization is terminated or revoked sooner. Performed at [facility], [place] [address], [place], [place] [address]          Radiology Studies: No results found.      Scheduled Meds: . allopurinol  200 mg Oral Daily  . aspirin EC  81 mg Oral Daily  . atorvastatin  20 mg Oral Daily  . dextromethorphan-guaiFENesin  1 tablet Oral BID  . docusate sodium  200 mg Oral BID  . [START ON 01/05/2020] hydrALAZINE  50 mg Oral Q8H  . Ibrutinib  420 mg Oral Daily  . metoprolol succinate  12.5 mg Oral Daily  . polyethylene glycol  17 g Oral Daily  . sodium chloride flush  3 mL Intravenous Q12H   Continuous Infusions: . sodium chloride       LOS: 3 days    Time spent: 35 minutes    Val Riles, MD [facility] Pager [phone]  If 7PM-7AM, please contact night-coverage 01/04/2020, 3:49 PM

## 2020-01-05 DIAGNOSIS — R778 Other specified abnormalities of plasma proteins: Secondary | ICD-10-CM | POA: Diagnosis not present

## 2020-01-05 DIAGNOSIS — C8300 Small cell B-cell lymphoma, unspecified site: Secondary | ICD-10-CM

## 2020-01-05 DIAGNOSIS — R0602 Shortness of breath: Secondary | ICD-10-CM

## 2020-01-05 DIAGNOSIS — I4819 Other persistent atrial fibrillation: Secondary | ICD-10-CM | POA: Diagnosis not present

## 2020-01-05 DIAGNOSIS — D696 Thrombocytopenia, unspecified: Secondary | ICD-10-CM

## 2020-01-05 DIAGNOSIS — N184 Chronic kidney disease, stage 4 (severe): Secondary | ICD-10-CM | POA: Diagnosis not present

## 2020-01-05 DIAGNOSIS — I5033 Acute on chronic diastolic (congestive) heart failure: Secondary | ICD-10-CM | POA: Diagnosis not present

## 2020-01-05 LAB — CBC
HCT: 42.7 % (ref 36.0–46.0)
Hemoglobin: 13 g/dL (ref 12.0–15.0)
MCH: 26 pg (ref 26.0–34.0)
MCHC: 30.4 g/dL (ref 30.0–36.0)
MCV: 85.4 fL (ref 80.0–100.0)
Platelets: 77 10*3/uL — ABNORMAL LOW (ref 150–400)
RBC: 5 MIL/uL (ref 3.87–5.11)
RDW: 16.1 % — ABNORMAL HIGH (ref 11.5–15.5)
WBC: 3.5 10*3/uL — ABNORMAL LOW (ref 4.0–10.5)
nRBC: 0 % (ref 0.0–0.2)

## 2020-01-05 LAB — BASIC METABOLIC PANEL
Anion gap: 7 (ref 5–15)
BUN: 27 mg/dL — ABNORMAL HIGH (ref 8–23)
CO2: 25 mmol/L (ref 22–32)
Calcium: 8.7 mg/dL — ABNORMAL LOW (ref 8.9–10.3)
Chloride: 107 mmol/L (ref 98–111)
Creatinine, Ser: 1.59 mg/dL — ABNORMAL HIGH (ref 0.44–1.00)
GFR calc Af Amer: 33 mL/min — ABNORMAL LOW (ref >60)
GFR calc non Af Amer: 29 mL/min — ABNORMAL LOW (ref >60)
Glucose, Bld: 96 mg/dL (ref 70–99)
Potassium: 4.1 mmol/L (ref 3.5–5.1)
Sodium: 139 mmol/L (ref 135–145)

## 2020-01-05 LAB — PHOSPHORUS: Phosphorus: 3.5 mg/dL (ref 2.5–4.6)

## 2020-01-05 LAB — MAGNESIUM: Magnesium: 2.2 mg/dL (ref 1.7–2.4)

## 2020-01-05 MED ORDER — HYDRALAZINE HCL 50 MG PO TABS
50.0000 mg | ORAL_TABLET | Freq: Three times a day (TID) | ORAL | Status: DC
  Administered 2020-01-05 – 2020-01-06 (×5): 50 mg via ORAL
  Filled 2020-01-05 (×5): qty 1

## 2020-01-05 MED ORDER — BENAZEPRIL HCL 20 MG PO TABS
20.0000 mg | ORAL_TABLET | Freq: Every day | ORAL | Status: DC
  Administered 2020-01-05 – 2020-01-06 (×2): 20 mg via ORAL
  Filled 2020-01-05 (×4): qty 1

## 2020-01-05 MED ORDER — RIVAROXABAN 15 MG PO TABS
15.0000 mg | ORAL_TABLET | Freq: Every day | ORAL | Status: DC
  Administered 2020-01-05 – 2020-01-09 (×5): 15 mg via ORAL
  Filled 2020-01-05 (×5): qty 1

## 2020-01-05 MED ORDER — METOPROLOL SUCCINATE ER 25 MG PO TB24
25.0000 mg | ORAL_TABLET | Freq: Once | ORAL | Status: AC
  Administered 2020-01-05: 17:00:00 25 mg via ORAL
  Filled 2020-01-05: qty 1

## 2020-01-05 MED ORDER — METOPROLOL SUCCINATE ER 50 MG PO TB24
50.0000 mg | ORAL_TABLET | Freq: Every day | ORAL | Status: DC
  Administered 2020-01-06: 50 mg via ORAL
  Filled 2020-01-05: qty 1

## 2020-01-05 NOTE — Progress Notes (Addendum)
PROGRESS NOTE    [name]  SWF:[id number] DOB: Apr 25, 1929 DOA: 12/31/2019 PCP: Jodi Marble, MD   Brief Narrative:  HPI: Kristy Solomon is a 86 y.o. female with medical history significant of hypertension, hyperlipidemia, gout, GERD, lymphoma, CKD-4, thrombocytopenia, CHF, atrial fibrillation not on anticoagulants, who presents with shortness breath and bilateral leg edema.  Patient states that she has been having shortness of breath in the past several days, which has been progressively worsening.  She has orthopnea.  Shortness breath is getting worse with minimal exertion.  She also has worsening bilateral leg edema.  Her PCP increased her torsemide recently dose without significant improvement.  Patient has mild dry cough, but no fever or chills.  Denies nausea, vomiting, diarrhea, abdominal pain, symptoms of UTI or unilateral weakness.  4/4: Patient seen and examined.  Clinically feels well,    Platelet count proved 84, No bleeding noted.  No abnormal bruising. Heart rate was very high 150s to 160s while ambulation but patient remained asymptomatic  Assessment & Plan:   Principal Problem:   Acute on chronic diastolic CHF (congestive heart failure) (HCC) Active Problems:   Malignant lymphoma, lymphoplasmacytic (HCC)   Thrombocytopenia (HCC)   HLD (hyperlipidemia)   HTN (hypertension)   CKD (chronic kidney disease), stage IV (HCC)   Atrial fibrillation, chronic (HCC)   Elevated troponin   Acute decompensated heart failure (HCC)  Acute on chronic diastolic CHF echo on 11/04/252 showed EF of 60-65%, grade II diastolic dysfunction.  Will hold home dose of torsemide.  IV Lasix held as creatinine was trending up Repeat chest x-ray negative for fluid overload Not requiring supplemental oxygen Plan: Monitor off diuretics today If kidney function remains stable can restart home torsemide tomorrow 01/05/20 in preparation for discharge Continue strict ins and outs and  daily weights Continue low-salt diet  Chronic a. fib: not taking anticoagulants at home.  Has bradycardia. Heart rate 150-160 while ambulation blood pressure soft Decrease metoprolol dose from 50 to 12.5 mg daily 4/3 started Cardizem 30 mg p.o. every 6 hourly.  TSH level diet within normal range Heart rate is still not under control, continue to monitor on telemetry follow cardiology consult  cardio recommended to start Xarelto, patient could not tolerate Eliquis in the past, discontinue Cardizem and increase Toprol-XL 50 mg   Thrombocytopenia:  This is a known chronic issue however patient had acute worsening thrombocytopenia today And platelet count this morning was 49, down from 75 Upon review of historical data this is below the patient's baseline even back to 2017 Unclear etiology, possible artifact versus lab error Update: On repeat CBC.  Count up to 84.  Suspect lab error.   Elevated troponin:most likely due to demand ischemia. Continue on tele   Malignant lymphoma, lymphoplasmacytic:  continue Ibrutinib.  Likely the cause of the leukopenia & thrombocytopenia.  F/u with oncology outpatient    HLD: continue on statin   Essential hypertension:  Creased metoprolol from 50 to 12.5 mg p.o. daily due to soft blood pressure  Amlodipine/benazapril/hydralazine on hold Restart home torsemide tomorrow in preparation for discharge if kidney function continues to improve  AKI on CKD stage IV: Creatinine again stabilized today Repeat kidney function in a.m.    Generalized weakness: seen by PT/OT recs no PT/OT f/u, plan to discharge home/self-care     DVT prophylaxis: SCDs Code Status: Full Family Communication: Left VM for daughter [name] [phone] on 01/03/2020 Disposition Plan: Anticipate return to previous home environment.  Physical therapy does  not recommend any follow-up in their standpoint.  Will monitor in house overnight.  Renal function stable, patient  still has soft blood pressure and tachycardia heart rate 150s 160s on ambulation. Started Cardizem, discontinued hydralazine, persistent heart rate is not under control so consulted cardiologist    Consultants:   Neurologist Dr. Rockey Situ  Procedures:   none  Antimicrobials:   none   Subjective: Patient seen and examined at bedside, no overnight issues.  Patient denies any complaints. Still heart rate is not under control but patient denies any chest pain or palpitations. No any symptoms. Cardiology consulted and will plan for disposition accordingly    Objective: Vitals:   01/05/20 0457 01/05/20 0737 01/05/20 0959 01/05/20 1147  BP: (!) 130/97 (!) 170/87  (!) 149/76  Pulse: (!) 56 (!) 45 100 96  Resp: 16 16  16   Temp: 98.1 F (36.7 C) 98.5 F (36.9 C)  98.1 F (36.7 C)  TempSrc: Oral Oral  Oral  SpO2: 98% 92%  100%  Weight: 93.7 kg     Height:        Intake/Output Summary (Last 24 hours) at 01/05/2020 1527 Last data filed at 01/04/2020 2105 Gross per 24 hour  Intake 3 ml  Output 0 ml  Net 3 ml   Filed Weights   01/03/20 0400 01/04/20 0241 01/05/20 0457  Weight: 93.2 kg 93.5 kg 93.7 kg    Examination:  General exam: Appears calm and comfortable  Respiratory system: Mild bibasilar crackles, no wheeze, normal work of breathing Cardiovascular system: S1 & S2 heard, RRR. No JVD, murmurs, rubs, gallops or clicks. No pedal edema. Gastrointestinal system: Abdomen is nondistended, soft and nontender. No organomegaly or masses felt. Normal bowel sounds heard. Central nervous system: Alert and oriented. No focal neurological deficits. Extremities: Symmetric 5 x 5 power. Skin: No rashes, lesions or ulcers Psychiatry: Judgement and insight appear normal. Mood & affect appropriate.     Data Reviewed: I have personally reviewed following labs and imaging studies  CBC: Recent Labs  Lab 12/31/19 0648 12/31/19 0648 01/01/20 0526 01/02/20 0604 01/03/20 0351  01/03/20 1413 01/05/20 0520  WBC 3.5*   < > 3.6* 3.5* 3.8* 4.1 3.5*  NEUTROABS 2.1  --   --   --   --  1.6*  --   HGB 13.8   < > 12.7 12.5 13.2 12.7 13.0  HCT 43.9   < > 40.5 41.0 44.6 42.1 42.7  MCV 82.8   < > 83.2 84.5 87.1 85.7 85.4  PLT 76*   < > 72* 75* 49* 84* 77*   < > = values in this interval not displayed.   Basic Metabolic Panel: Recent Labs  Lab 01/01/20 0526 01/02/20 0604 01/03/20 0351 01/04/20 0324 01/05/20 0520  NA 140 139 141 140 139  K 3.7 3.7 4.0 4.1 4.1  CL 105 102 107 105 107  CO2 28 27 25 28 25   GLUCOSE 97 98 98 102* 96  BUN 32* 36* 34* 30* 27*  CREATININE 1.82* 1.95* 1.76* 1.61* 1.59*  CALCIUM 8.4* 8.2* 8.2* 8.3* 8.7*  MG 2.0  --   --   --  2.2  PHOS  --   --   --   --  3.5   GFR: Estimated Creatinine Clearance: 27.1 mL/min (A) (by C-G formula based on SCr of 1.59 mg/dL (H)). Liver Function Tests: Recent Labs  Lab 12/31/19 0718  AST 20  ALT 14  ALKPHOS 32*  BILITOT 0.9  PROT 5.8*  ALBUMIN 3.0*   No results for input(s): LIPASE, AMYLASE in the last 168 hours. No results for input(s): AMMONIA in the last 168 hours. Coagulation Profile: No results for input(s): INR, PROTIME in the last 168 hours. Cardiac Enzymes: No results for input(s): CKTOTAL, CKMB, CKMBINDEX, TROPONINI in the last 168 hours. BNP (last 3 results) No results for input(s): PROBNP in the last 8760 hours. HbA1C: No results for input(s): HGBA1C in the last 72 hours. CBG: No results for input(s): GLUCAP in the last 168 hours. Lipid Profile: No results for input(s): CHOL, HDL, LDLCALC, TRIG, CHOLHDL, LDLDIRECT in the last 72 hours. Thyroid Function Tests: Recent Labs    01/04/20 0324  TSH 3.817   Anemia Panel: No results for input(s): VITAMINB12, FOLATE, FERRITIN, TIBC, IRON, RETICCTPCT in the last 72 hours. Sepsis Labs: No results for input(s): PROCALCITON, LATICACIDVEN in the last 168 hours.  Recent Results (from the past 240 hour(s))  SARS CORONAVIRUS 2 (TAT 6-24  HRS) Nasopharyngeal Nasopharyngeal Swab     Status: None   Collection Time: 12/31/19  9:47 AM   Specimen: Nasopharyngeal Swab  Result Value Ref Range Status   SARS Coronavirus 2 NEGATIVE NEGATIVE Final    Comment: (NOTE) SARS-CoV-2 target nucleic acids are NOT DETECTED. The SARS-CoV-2 RNA is generally detectable in upper and lower respiratory specimens during the acute phase of infection. Negative results do not preclude SARS-CoV-2 infection, do not rule out co-infections with other pathogens, and should not be used as the sole basis for treatment or other patient management decisions. Negative results must be combined with clinical observations, patient history, and epidemiological information. The expected result is Negative. Fact Sheet for Patients: SugarRoll.be Fact Sheet for Healthcare Providers: https://www.woods-mathews.com/ This test is not yet approved or cleared by the [place] FDA and  has been authorized for detection and/or diagnosis of SARS-CoV-2 by FDA under an Emergency Use Authorization (EUA). This EUA will remain  in effect (meaning this test can be used) for the duration of the COVID-19 declaration under Section 56 4(b)(1) of the Act, 21 U.S.C. section 360bbb-3(b)(1), unless the authorization is terminated or revoked sooner. Performed at [facility], [place] [address], [place], [place] [address]          Radiology Studies: No results found.      Scheduled Meds: . allopurinol  200 mg Oral Daily  . aspirin EC  81 mg Oral Daily  . atorvastatin  20 mg Oral Daily  . benazepril  20 mg Oral Daily  . dextromethorphan-guaiFENesin  1 tablet Oral BID  . diltiazem  30 mg Oral TID PC & HS  . docusate sodium  200 mg Oral BID  . gabapentin  200 mg Oral TID  . hydrALAZINE  50 mg Oral TID  . Ibrutinib  420 mg Oral Daily  . metoprolol succinate  12.5 mg Oral Daily  . polyethylene glycol  17 g Oral Daily  . sodium  chloride flush  3 mL Intravenous Q12H   Continuous Infusions: . sodium chloride       LOS: 3 days    Time spent: 35 minutes    Val Riles, MD [facility] Pager [phone]  If 7PM-7AM, please contact night-coverage 01/05/2020, 3:27 PM

## 2020-01-05 NOTE — Progress Notes (Signed)
I have reviewed the charting done by the Student-RN and I agree with his assessments.  Rual Vermeer A Rylann Munford, RN 

## 2020-01-05 NOTE — Consult Note (Signed)
Cardiology Consultation:   Patient ID: Kristy Solomon MRN: [id number]; DOB: April 28, 1929  Admit date: 12/31/2019 Date of Consult: 01/05/2020  Primary Care Provider: Jodi Marble, MD Primary Cardiologist: new to [facility] Physician requesting consult: Dr. Dwyane Dee Reason for consult: Atrial fibrillation, hypertension    Patient Profile:   Kristy Solomon is a 86 y.o. female with a hx of  Hypertension, hyperlipidemia, renal failure, CHF, malignant lymphoma, hematuria  Persistent atrial fibrillation, CHA2DS2-VASc score is 5  presenting to the hospital with shortness of breath, leg edema, atrial fibrillation with RVR   History of Present Illness:   Kristy Solomon reports that for the past 3 months she has had significant weight gain 15 to 20 pounds, worsening leg edema, worse over the past week Has difficulty getting around, has some shortness of breath, orthopnea, sleeping in her recliner Primary care giving her increasing doses of Lasix without improvement  Presented to the hospital December 31, 2019 On arrival to the hospital blood pressure 170/117 At home blood pressure appears to be on amlodipine benazepril, hydralazine, metoprolol, Also on torsemide 40 daily Medication changes made in the emergency room:  on Lasix and metoprolol, other medications held -Metoprolol was decreased from 50 down to 12.5 daily  Lab work reviewed platelets of 76, creatinine 1.56 BUN 30 GFR 34 Troponin 43  EKG notable for atrial fibrillation rate 89 bpm  Chest x-ray with cardiomegaly, possible edema  CT chest Aortic atherosclerosis Pulmonary vascular congestion Ill-defined opacities bilateral bases Trace effusion  2D echo on 10/10/2019 showed EF of 60-65%.  Repeat echocardiogram December 31, 2019 ejection fraction 50 to 55%, dilated IVC, mild to moderate TR,  Ambulation around the unit yesterday rhythm atrial fibrillation rate 150 up to 160, was reportedly asymptomatic  CT scan from 07/2019  showed ill-defined soft tissue infiltrated into the renal sinus and extended into region of vascular pedicle. Stable left renal calculi/ infiltrative mass in lower pole region dated bck to 2017 presumably related to lymphoma.   ED on 12/16/19 complaining of hematuria  E. coli positive  Review of EKGs EKG October 2020 normal sinus rhythm October 10, 2019 showing atrial fibrillation rate 107 November 18, 2019 atrial fibrillation rate 115 December 31, 2019 atrial fibrillation rate 89 bpm January 01, 2020 atrial fibrillation rate 88, PVCs EKG January 05, 2020 atrial fibrillation PVCs rate 103  At rest heart rate 70s to 100s,  Currently on diltiazem 3 times daily, metoprolol succinate 12.5 daily Currently feels her leg swelling is better, has had legs up for several days during her hospital course   outpatient monitor for atrial fibrillation noted, July 26, 2019,  frequent PVCs 20% burden   Past Medical History:  Diagnosis Date  . 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 Hyperlipidemia   . Hypertension   . Malignant lymphoma, lymphoplasmacytic ([place]) 12/27/2013  . Renal insufficiency     Past Surgical History:  Procedure Laterality Date  . ABDOMINAL HYSTERECTOMY    . HEMORRHOID SURGERY       Home Medications:  Prior to Admission medications   Medication Sig Start Date End Date Taking? Authorizing Provider  allopurinol (ZYLOPRIM) 100 MG tablet Take 2 tablets (200 mg total) by mouth daily. 09/11/18  Yes Karen Kitchens, NP  amLODipine-benazepril (LOTREL) 5-20 MG capsule Take 1 capsule by mouth daily.  10/08/19  Yes [provider]  atorvastatin (LIPITOR) 20 MG tablet Take 20 mg by mouth daily. 12/24/13  Yes [provider]  gabapentin (NEURONTIN) 300 MG capsule Take  300 mg by mouth 3 (three) times daily.    Yes [provider]  hydrALAZINE (APRESOLINE) 50 MG tablet Take 1 tablet (50 mg total) by mouth 3 (three) times daily. 10/18/19 10/17/20 Yes Agbor-Etang, Aaron Edelman, MD    Ibrutinib (IMBRUVICA) 420 MG TABS Take 420 mg by mouth daily. 11/06/19  Yes Corcoran, Drue Second, MD  metoprolol succinate (TOPROL-XL) 50 MG 24 hr tablet Take 1 tablet (50 mg total) by mouth daily. Patient taking differently: Take 25 mg by mouth daily.  10/21/19  Yes Agbor-Etang, Aaron Edelman, MD  nitrofurantoin, macrocrystal-monohydrate, (MACROBID) 100 MG capsule Take 1 capsule (100 mg total) by mouth every 12 (twelve) hours. 12/24/19  Yes Hollice Espy, MD  potassium chloride (MICRO-K) 10 MEQ CR capsule TK 1 C PO QAM 04/25/19  Yes [provider]  torsemide (DEMADEX) 20 MG tablet Take 2 tablets (40 mg total) by mouth daily. 11/18/19  Yes Agbor-Etang, Aaron Edelman, MD  diclofenac sodium (VOLTAREN) 1 % GEL Apply 4 g topically 2 (two) times daily as needed. Patient not taking: Reported on 12/31/2019 09/15/18   Johnn Hai, PA-C  folic acid (FOLVITE) 1 MG tablet TAKE 1 TABLET BY MOUTH ONCE DAILY Patient not taking: Reported on 12/31/2019 07/03/18   Lequita Asal, MD    Inpatient Medications: Scheduled Meds: . allopurinol  200 mg Oral Daily  . aspirin EC  81 mg Oral Daily  . atorvastatin  20 mg Oral Daily  . benazepril  20 mg Oral Daily  . dextromethorphan-guaiFENesin  1 tablet Oral BID  . diltiazem  30 mg Oral TID PC & HS  . docusate sodium  200 mg Oral BID  . gabapentin  200 mg Oral TID  . hydrALAZINE  50 mg Oral TID  . Ibrutinib  420 mg Oral Daily  . metoprolol succinate  12.5 mg Oral Daily  . polyethylene glycol  17 g Oral Daily  . sodium chloride flush  3 mL Intravenous Q12H   Continuous Infusions: . sodium chloride     PRN Meds: sodium chloride, acetaminophen, albuterol, hydrALAZINE, ondansetron (ZOFRAN) IV, sodium chloride flush  Allergies:    Allergies  Allergen Reactions  . Eliquis [Apixaban]     abd pain    Social History:   Social History   Socioeconomic History  . Marital status: Widowed    Spouse name: Not on file  . Number of children: Not on file  . Years  of education: Not on file  . Highest education level: Not on file  Occupational History  . Not on file  Tobacco Use  . Smoking status: Never Smoker  . Smokeless tobacco: Never Used  Substance and Sexual Activity  . Alcohol use: No  . Drug use: No  . Sexual activity: Not on file  Other Topics Concern  . Not on file  Social History Narrative   Lives alone. Daughter assists as needed.   Social Determinants of Health   Financial Resource Strain:   . Difficulty of Paying Living Expenses:   Food Insecurity:   . Worried About Charity fundraiser in the Last Year:   . Arboriculturist in the Last Year:   Transportation Needs:   . Film/video editor (Medical):   Marland Kitchen Lack of Transportation (Non-Medical):   Physical Activity:   . Days of Exercise per Week:   . Minutes of Exercise per Session:   Stress:   . Feeling of Stress :   Social Connections:   . Frequency of Communication  with Friends and Family:   . Frequency of Social Gatherings with Friends and Family:   . Attends Religious Services:   . Active Member of Clubs or Organizations:   . Attends Archivist Meetings:   Marland Kitchen Marital Status:   Intimate Partner Violence:   . Fear of Current or Ex-Partner:   . Emotionally Abused:   Marland Kitchen Physically Abused:   . Sexually Abused:     Family History:    Family History  Problem Relation Age of Onset  . Cancer Brother        throat ca  . Cancer Brother        bone cancer     ROS:  Please see the history of present illness.  Review of Systems  Constitutional: Negative.   HENT: Negative.   Respiratory: Positive for shortness of breath.   Cardiovascular: Negative.   Gastrointestinal: Negative.   Musculoskeletal: Negative.   Neurological: Negative.   Psychiatric/Behavioral: Negative.   All other systems reviewed and are negative.   Physical Exam/Data:   Vitals:   01/05/20 0457 01/05/20 0737 01/05/20 0959 01/05/20 1147  BP: (!) 130/97 (!) 170/87  (!) 149/76    Pulse: (!) 56 (!) 45 100 96  Resp: 16 16  16   Temp: 98.1 F (36.7 C) 98.5 F (36.9 C)  98.1 F (36.7 C)  TempSrc: Oral Oral  Oral  SpO2: 98% 92%  100%  Weight: 93.7 kg     Height:        Intake/Output Summary (Last 24 hours) at 01/05/2020 1447 Last data filed at 01/04/2020 2105 Gross per 24 hour  Intake 3 ml  Output 0 ml  Net 3 ml   Last 3 Weights 01/05/2020 01/04/2020 01/03/2020  Weight (lbs) 206 lb 9.6 oz 206 lb 3.2 oz 205 lb 8 oz  Weight (kg) 93.713 kg 93.532 kg 93.214 kg     Body mass index is 35.46 kg/m.  General:  Well nourished, well developed, in no acute distress HEENT: normal Lymph: no adenopathy Neck: no JVD Endocrine:  No thryomegaly Vascular: No carotid bruits; FA pulses 2+ bilaterally without bruits  Cardiac: Irregularly irregular no murmur  Lungs:  clear to auscultation bilaterally, no wheezing, rhonchi or rales  Abd: soft, nontender, no hepatomegaly  Ext: no edema Musculoskeletal:  No deformities, BUE and BLE strength normal and equal Skin: warm and dry  Neuro:  CNs 2-12 intact, no focal abnormalities noted Psych:  Normal affect   EKG:  The EKG was personally reviewed and demonstrates:   Review of EKGs EKG October 2020 normal sinus rhythm October 10, 2019 showing atrial fibrillation rate 107 November 18, 2019 atrial fibrillation rate 115 December 31, 2019 atrial fibrillation rate 89 bpm January 01, 2020 atrial fibrillation rate 88, PVCs EKG January 05, 2020 atrial fibrillation PVCs rate 103  Telemetry:  Telemetry was personally reviewed and demonstrates: Atrial fibrillation  Relevant CV Studies: Echocardiogram 1. Left ventricular ejection fraction, by estimation, is 50 to 55%. The  left ventricle has low normal function. The left ventricle has no regional  wall motion abnormalities. There is mild left ventricular hypertrophy.  Left ventricular diastolic  parameters are indeterminate.  2. Right ventricular systolic function is normal. The right ventricular   size is normal. Tricuspid regurgitation signal is inadequate for assessing  PA pressure.  3. Left atrial size was mildly dilated.  4. Right atrial size was mildly dilated.  5. The mitral valve is abnormal. Mild mitral valve regurgitation. No  evidence of  mitral stenosis.  6. Tricuspid valve regurgitation is mild to moderate.  7. The aortic valve is tricuspid. Aortic valve regurgitation is not  visualized. No aortic stenosis is present.  8. The inferior vena cava is dilated in size with <50% respiratory  variability, suggesting right atrial pressure of 15 mmHg.   Laboratory Data:  High Sensitivity Troponin:   Recent Labs  Lab 12/31/19 0718 12/31/19 0853 12/31/19 1110 12/31/19 1350 12/31/19 1637  TROPONINIHS 43* 50* 59* 51* 49*     Chemistry Recent Labs  Lab 01/03/20 0351 01/04/20 0324 01/05/20 0520  NA 141 140 139  K 4.0 4.1 4.1  CL 107 105 107  CO2 25 28 25   GLUCOSE 98 102* 96  BUN 34* 30* 27*  CREATININE 1.76* 1.61* 1.59*  CALCIUM 8.2* 8.3* 8.7*  GFRNONAA 25* 28* 29*  GFRAA 29* 33* 33*  ANIONGAP 9 7 7     Recent Labs  Lab 12/31/19 0718  PROT 5.8*  ALBUMIN 3.0*  AST 20  ALT 14  ALKPHOS 32*  BILITOT 0.9   Hematology Recent Labs  Lab 01/03/20 0351 01/03/20 1413 01/05/20 0520  WBC 3.8* 4.1 3.5*  RBC 5.12* 4.91 5.00  HGB 13.2 12.7 13.0  HCT 44.6 42.1 42.7  MCV 87.1 85.7 85.4  MCH 25.8* 25.9* 26.0  MCHC 29.6* 30.2 30.4  RDW 16.1* 15.9* 16.1*  PLT 49* 84* 77*   BNP Recent Labs  Lab 12/31/19 0648  BNP 203.0*    DDimer No results for input(s): DDIMER in the last 168 hours.   Radiology/Studies:  DG Chest Port 1 View  Result Date: 01/02/2020 CLINICAL DATA:  Short of breath fluid overload EXAM: PORTABLE CHEST 1 VIEW COMPARISON:  12/31/2019 FINDINGS: Cardiac enlargement. Negative for heart failure or edema. No significant pleural effusion. Left lower lobe atelectasis/infiltrate unchanged. Mild right lower lobe airspace disease unchanged.  IMPRESSION: Negative for heart failure or edema. Bibasilar airspace disease left greater than right unchanged may represent atelectasis or possibly pneumonia Electronically Signed   By: Franchot Gallo M.D.   On: 01/02/2020 08:34   {   Assessment and Plan:   1. Atrial fibrillation with RVR Persistent atrial fibrillation since January 2021 Since then with weight gain, leg edema, diastolic heart failure symptoms -Reports unable to tolerate Eliquis, took 1 dose and it caused some stomach trouble and stop the medication.  Reports she was not given an alternative medication --- Given chads vasc 5, recommend she try alternate medication, we will start Xarelto 15 mg daily  --Creatinine clearance estimated 36 We will stop aspirin --In terms of rate control, diltiazem 30 3 times daily started with metoprolol succinate 12.5 mg daily There was some concern of bradycardia on higher dose metoprolol but not well documented, having more trouble with rapid rate on telemetry --She does have mild chronic lower extremity edema, will try to avoid calcium channel blockers -We will increase metoprolol succinate up to 50 daily --- Would plan for attempt to restore normal sinus rhythm in 1 month's time given leg edema, heart failure symptoms, difficulty managing as an outpatient even with torsemide.  Stressed importance of compliance with anticoagulation  2.  Severe aortic atherosclerosis  seen on CT scan Outpatient management, cholesterol is at goal  Chronic diastolic CHF Recent exacerbation in the setting of atrial fibrillation since January 2021, persistent since that time Would likely do better in normal sinus rhythm Recommend anticoagulation 1 month then consider options to restore normal sinus rhythm  Malignant lymphoma, lymphoplasmacytic Followed by oncology  Low platelets 77, low WBC 3.5, stable hematocrit  Chronic renal failure Creatinine 1.59, low end of her range Possibly component of cardiorenal  syndrome as renal function has improved with diuresis on IV Lasix    Total encounter time more than 110 minutes  Greater than 50% was spent in counseling and coordination of care with the patient   For questions or updates, please contact [facility] Please consult [email or web address] for contact info under     Signed, Ida Rogue, MD  01/05/2020 2:47 PM

## 2020-01-06 ENCOUNTER — Observation Stay: Payer: Medicare HMO

## 2020-01-06 DIAGNOSIS — D72819 Decreased white blood cell count, unspecified: Secondary | ICD-10-CM | POA: Diagnosis present

## 2020-01-06 DIAGNOSIS — I482 Chronic atrial fibrillation, unspecified: Secondary | ICD-10-CM

## 2020-01-06 DIAGNOSIS — J189 Pneumonia, unspecified organism: Secondary | ICD-10-CM | POA: Diagnosis not present

## 2020-01-06 DIAGNOSIS — R609 Edema, unspecified: Secondary | ICD-10-CM | POA: Diagnosis present

## 2020-01-06 DIAGNOSIS — D696 Thrombocytopenia, unspecified: Secondary | ICD-10-CM | POA: Diagnosis present

## 2020-01-06 DIAGNOSIS — I130 Hypertensive heart and chronic kidney disease with heart failure and stage 1 through stage 4 chronic kidney disease, or unspecified chronic kidney disease: Secondary | ICD-10-CM | POA: Diagnosis present

## 2020-01-06 DIAGNOSIS — J181 Lobar pneumonia, unspecified organism: Secondary | ICD-10-CM | POA: Diagnosis not present

## 2020-01-06 DIAGNOSIS — I5033 Acute on chronic diastolic (congestive) heart failure: Secondary | ICD-10-CM

## 2020-01-06 DIAGNOSIS — I5043 Acute on chronic combined systolic (congestive) and diastolic (congestive) heart failure: Secondary | ICD-10-CM

## 2020-01-06 DIAGNOSIS — R778 Other specified abnormalities of plasma proteins: Secondary | ICD-10-CM | POA: Diagnosis not present

## 2020-01-06 DIAGNOSIS — K219 Gastro-esophageal reflux disease without esophagitis: Secondary | ICD-10-CM | POA: Diagnosis present

## 2020-01-06 DIAGNOSIS — I248 Other forms of acute ischemic heart disease: Secondary | ICD-10-CM | POA: Diagnosis present

## 2020-01-06 DIAGNOSIS — Z20822 Contact with and (suspected) exposure to covid-19: Secondary | ICD-10-CM | POA: Diagnosis present

## 2020-01-06 DIAGNOSIS — E785 Hyperlipidemia, unspecified: Secondary | ICD-10-CM | POA: Diagnosis present

## 2020-01-06 DIAGNOSIS — R001 Bradycardia, unspecified: Secondary | ICD-10-CM | POA: Diagnosis present

## 2020-01-06 DIAGNOSIS — J9601 Acute respiratory failure with hypoxia: Secondary | ICD-10-CM | POA: Diagnosis not present

## 2020-01-06 DIAGNOSIS — N170 Acute kidney failure with tubular necrosis: Secondary | ICD-10-CM | POA: Diagnosis not present

## 2020-01-06 DIAGNOSIS — N1832 Chronic kidney disease, stage 3b: Secondary | ICD-10-CM | POA: Diagnosis not present

## 2020-01-06 DIAGNOSIS — N184 Chronic kidney disease, stage 4 (severe): Secondary | ICD-10-CM | POA: Diagnosis present

## 2020-01-06 DIAGNOSIS — I10 Essential (primary) hypertension: Secondary | ICD-10-CM | POA: Diagnosis not present

## 2020-01-06 DIAGNOSIS — Z90710 Acquired absence of both cervix and uterus: Secondary | ICD-10-CM | POA: Diagnosis not present

## 2020-01-06 DIAGNOSIS — R7303 Prediabetes: Secondary | ICD-10-CM | POA: Diagnosis present

## 2020-01-06 DIAGNOSIS — I4819 Other persistent atrial fibrillation: Secondary | ICD-10-CM | POA: Diagnosis present

## 2020-01-06 DIAGNOSIS — M109 Gout, unspecified: Secondary | ICD-10-CM | POA: Diagnosis present

## 2020-01-06 DIAGNOSIS — N179 Acute kidney failure, unspecified: Secondary | ICD-10-CM | POA: Diagnosis present

## 2020-01-06 DIAGNOSIS — Z79899 Other long term (current) drug therapy: Secondary | ICD-10-CM | POA: Diagnosis not present

## 2020-01-06 DIAGNOSIS — C8590 Non-Hodgkin lymphoma, unspecified, unspecified site: Secondary | ICD-10-CM | POA: Diagnosis present

## 2020-01-06 LAB — CBC
HCT: 45.4 % (ref 36.0–46.0)
Hemoglobin: 14 g/dL (ref 12.0–15.0)
MCH: 25.9 pg — ABNORMAL LOW (ref 26.0–34.0)
MCHC: 30.8 g/dL (ref 30.0–36.0)
MCV: 83.9 fL (ref 80.0–100.0)
Platelets: 79 10*3/uL — ABNORMAL LOW (ref 150–400)
RBC: 5.41 MIL/uL — ABNORMAL HIGH (ref 3.87–5.11)
RDW: 16.6 % — ABNORMAL HIGH (ref 11.5–15.5)
WBC: 4.6 10*3/uL (ref 4.0–10.5)
nRBC: 0 % (ref 0.0–0.2)

## 2020-01-06 LAB — BASIC METABOLIC PANEL
Anion gap: 7 (ref 5–15)
BUN: 26 mg/dL — ABNORMAL HIGH (ref 8–23)
CO2: 26 mmol/L (ref 22–32)
Calcium: 8.6 mg/dL — ABNORMAL LOW (ref 8.9–10.3)
Chloride: 107 mmol/L (ref 98–111)
Creatinine, Ser: 1.55 mg/dL — ABNORMAL HIGH (ref 0.44–1.00)
GFR calc Af Amer: 34 mL/min — ABNORMAL LOW (ref >60)
GFR calc non Af Amer: 30 mL/min — ABNORMAL LOW (ref >60)
Glucose, Bld: 103 mg/dL — ABNORMAL HIGH (ref 70–99)
Potassium: 4.4 mmol/L (ref 3.5–5.1)
Sodium: 140 mmol/L (ref 135–145)

## 2020-01-06 LAB — MAGNESIUM: Magnesium: 2.3 mg/dL (ref 1.7–2.4)

## 2020-01-06 LAB — PHOSPHORUS: Phosphorus: 3.8 mg/dL (ref 2.5–4.6)

## 2020-01-06 MED ORDER — LEVOFLOXACIN IN D5W 750 MG/150ML IV SOLN
750.0000 mg | INTRAVENOUS | Status: DC
  Administered 2020-01-06: 750 mg via INTRAVENOUS
  Filled 2020-01-06 (×2): qty 150

## 2020-01-06 MED ORDER — METOPROLOL SUCCINATE ER 100 MG PO TB24
100.0000 mg | ORAL_TABLET | Freq: Every day | ORAL | Status: DC
  Administered 2020-01-07: 100 mg via ORAL
  Filled 2020-01-06: qty 1

## 2020-01-06 MED ORDER — HYDRALAZINE HCL 25 MG PO TABS
25.0000 mg | ORAL_TABLET | Freq: Three times a day (TID) | ORAL | Status: DC
  Administered 2020-01-06: 25 mg via ORAL
  Filled 2020-01-06: qty 1

## 2020-01-06 MED ORDER — METOPROLOL SUCCINATE ER 25 MG PO TB24
25.0000 mg | ORAL_TABLET | Freq: Once | ORAL | Status: AC
  Administered 2020-01-06: 25 mg via ORAL
  Filled 2020-01-06: qty 1

## 2020-01-06 MED ORDER — IPRATROPIUM BROMIDE 0.02 % IN SOLN
0.5000 mg | RESPIRATORY_TRACT | Status: DC | PRN
  Administered 2020-01-06: 0.5 mg via RESPIRATORY_TRACT
  Filled 2020-01-06: qty 2.5

## 2020-01-06 MED ORDER — MENTHOL 3 MG MT LOZG
1.0000 | LOZENGE | OROMUCOSAL | Status: DC | PRN
  Administered 2020-01-06 – 2020-01-07 (×2): 3 mg via ORAL
  Filled 2020-01-06: qty 9

## 2020-01-06 NOTE — Progress Notes (Signed)
Pharmacy Antibiotic Note  Kristy Solomon is a 86 y.o. female admitted on 12/31/2019 with pneumonia.  Pharmacy has been consulted for Levaquin dosing.  Plan: Will start levaquin 750 mg PO Q48H on 4/5 @ 1700. CrCl = 27.9 ml/min   Height: 5\' 4"  (162.6 cm) Weight: 94 kg (207 lb 3.2 oz) IBW/kg (Calculated) : 54.7  Temp (24hrs), Avg:98.7 F (37.1 C), Min:98.2 F (36.8 C), Max:99 F (37.2 C)  Recent Labs  Lab 01/02/20 0604 01/03/20 0351 01/03/20 1413 01/04/20 0324 01/05/20 0520 01/06/20 0445  WBC 3.5* 3.8* 4.1  --  3.5* 4.6  CREATININE 1.95* 1.76*  --  1.61* 1.59* 1.55*    Estimated Creatinine Clearance: 27.9 mL/min (A) (by C-G formula based on SCr of 1.55 mg/dL (H)).    Allergies  Allergen Reactions  . Eliquis [Apixaban]     abd pain    Antimicrobials this admission:   >>    >>   Dose adjustments this admission:   Microbiology results:  BCx:   UCx:    Sputum:    MRSA PCR:   Thank you for allowing pharmacy to be a part of this patient's care.  Aynslee Mulhall D 01/06/2020 4:59 PM

## 2020-01-06 NOTE — Progress Notes (Signed)
Progress Note  Patient Name: Kristy Solomon Date of Encounter: 01/06/2020  Primary Cardiologist: Dr. Garen Lah  Subjective   Reports significant abdominal pain nausea.  Not tender to palpation.  No emesis or diarrhea.  States that she is having regular bowel movements.  She is unclear as to the etiology of her nausea and abdominal pain this morning.  She denies any chest pain, palpitations, or racing heart rate despite rates in the 110s to 120s while in the room with me.  She is not able to move well in the bed and states that she is too weak to sit up completely this AM.  Inpatient Medications    Scheduled Meds: . allopurinol  200 mg Oral Daily  . atorvastatin  20 mg Oral Daily  . benazepril  20 mg Oral Daily  . dextromethorphan-guaiFENesin  1 tablet Oral BID  . docusate sodium  200 mg Oral BID  . gabapentin  200 mg Oral TID  . hydrALAZINE  50 mg Oral TID  . Ibrutinib  420 mg Oral Daily  . metoprolol succinate  50 mg Oral Daily  . polyethylene glycol  17 g Oral Daily  . rivaroxaban  15 mg Oral Q supper  . sodium chloride flush  3 mL Intravenous Q12H   Continuous Infusions: . sodium chloride     PRN Meds: sodium chloride, acetaminophen, albuterol, ipratropium, ondansetron (ZOFRAN) IV, sodium chloride flush   Vital Signs    Vitals:   01/05/20 1543 01/05/20 1952 01/06/20 0458 01/06/20 0756  BP: (!) 163/83 140/76 (!) 178/76 138/80  Pulse: 80 (!) 46 (!) 57 88  Resp: 16 20 20 18   Temp: 98 F (36.7 C) 98.9 F (37.2 C) 98.6 F (37 C) 98.2 F (36.8 C)  TempSrc: Oral Oral Oral Oral  SpO2: 100% 98% 100% 100%  Weight:   94 kg   Height:        Intake/Output Summary (Last 24 hours) at 01/06/2020 0943 Last data filed at 01/06/2020 0925 Gross per 24 hour  Intake 303 ml  Output --  Net 303 ml   Last 3 Weights 01/06/2020 01/05/2020 01/04/2020  Weight (lbs) 207 lb 3.2 oz 206 lb 9.6 oz 206 lb 3.2 oz  Weight (kg) 93.985 kg 93.713 kg 93.532 kg      Telemetry    Afib with  PVCs, ventricular rates in low 90s-130s (escalates with movement in bed) - Personally Reviewed  ECG    No new tracings - Personally Reviewed  Physical Exam   GEN: No acute distress though looks uncomfortable. Neck: No JVD Cardiac: tachycardic and IRIR, no murmurs, rubs, or gallops.  Respiratory: Bilateral wheezing. GI: Soft, nontender, non-distended  MS: Bilateral non-pitting edema, worse in the bilateral ankles; No deformity. Neuro:  Nonfocal  Psych: Normal affect   Labs    High Sensitivity Troponin:   Recent Labs  Lab 12/31/19 0718 12/31/19 0853 12/31/19 1110 12/31/19 1350 12/31/19 1637  TROPONINIHS 43* 50* 59* 51* 49*      Chemistry Recent Labs  Lab 12/31/19 0718 01/01/20 0526 01/04/20 0324 01/05/20 0520 01/06/20 0445  NA 141   < > 140 139 140  K 4.4   < > 4.1 4.1 4.4  CL 110   < > 105 107 107  CO2 23   < > 28 25 26   GLUCOSE 88   < > 102* 96 103*  BUN 30*   < > 30* 27* 26*  CREATININE 1.56*   < > 1.61* 1.59* 1.55*  CALCIUM 8.0*   < > 8.3* 8.7* 8.6*  PROT 5.8*  --   --   --   --   ALBUMIN 3.0*  --   --   --   --   AST 20  --   --   --   --   ALT 14  --   --   --   --   ALKPHOS 32*  --   --   --   --   BILITOT 0.9  --   --   --   --   GFRNONAA 29*   < > 28* 29* 30*  GFRAA 34*   < > 33* 33* 34*  ANIONGAP 8   < > 7 7 7    < > = values in this interval not displayed.     Hematology Recent Labs  Lab 01/03/20 1413 01/05/20 0520 01/06/20 0445  WBC 4.1 3.5* 4.6  RBC 4.91 5.00 5.41*  HGB 12.7 13.0 14.0  HCT 42.1 42.7 45.4  MCV 85.7 85.4 83.9  MCH 25.9* 26.0 25.9*  MCHC 30.2 30.4 30.8  RDW 15.9* 16.1* 16.6*  PLT 84* 77* 79*    BNP Recent Labs  Lab 12/31/19 0648  BNP 203.0*     DDimer No results for input(s): DDIMER in the last 168 hours.   Radiology    No results found.  Cardiac Studies   Echo 12/31/19 1. Left ventricular ejection fraction, by estimation, is 50 to 55%. The  left ventricle has low normal function. The left ventricle  has no regional  wall motion abnormalities. There is mild left ventricular hypertrophy.  Left ventricular diastolic  parameters are indeterminate.  2. Right ventricular systolic function is normal. The right ventricular  size is normal. Tricuspid regurgitation signal is inadequate for assessing  PA pressure.  3. Left atrial size was mildly dilated.  4. Right atrial size was mildly dilated.  5. The mitral valve is abnormal. Mild mitral valve regurgitation. No  evidence of mitral stenosis.  6. Tricuspid valve regurgitation is mild to moderate.  7. The aortic valve is tricuspid. Aortic valve regurgitation is not  visualized. No aortic stenosis is present.  8. The inferior vena cava is dilated in size with <50% respiratory  variability, suggesting right atrial pressure of 15 mmHg.    Patient Profile     86 y.o. female with history of HFpEF, persistent atrial fibrillation, HTN, HLD, lymphoma, CKD, chronic LEE, and anemia, and who is being seen today for the evaluation of volume overload.  Assessment & Plan    Persistent Atrial Fibrillation with RVR --Afib with RVR, initially diagnosed 10/2019. Denies sx in Afib with PVCs though consider SOB as sx of Afib.  --CHA2DS2VASc score of at least 6 (CHF, HTN, age x2, vascular, female). Reports poor tolerance of Eliquis 2/2 stomach discomfort. Started on Xarelto 15mg  daily due to CrCl below 50. --Continue current rate control and titrate as needed for goal HR below 110. Rates increase with movement in bed and average rates are in the low 100s at this time. Most recent TSH 3.817. Continue to monitor electrolytes. --Consider Xopenex in lieu of albuterol due to preferable side effect profile in patients with need for rate control.  --Not therapeutically anticoagulated and no current plan for DCCV this admission. Consider that also not an ideal candidate for cardioversion until able to prove [place] compliance given self discontinued Eliquis.  Chronic  diastolic CHF --Denies current SOB and reports nausea. Diuresis held this entire  admission  2/2 elevated Cr with subsequent improvement in Cr. PTA diuresis recently increased from lasix 40mg  daily to torsemide 40mg  daily with PRN tablet as needed for volume. --Consider SOB as multifactorial and in the setting of rapid ventricular rate, pulmonary dz, lymphoma, AOCKD, & breast / scapular lesions as indicated on 07/2019 CT. 3/30 echo showed diastolic dysfunction / IVC dilated in size and elevated RA pressure. Weight has remained stable off of diuresis since admission.  --Monitor I/Os and daily weights. +936cc yesterday and +1,236 for the admission. Weight 95.3kg  94kg. As above, she continues to remain well below her 11/18/19 office weight of 104.8 kg and 10/21/19 office weight of 99.4kg.  --Continue BB and ACE. Continue daily BMET. With continued report of SOB, consider alternative rate control as current BB may be contributing to her current SOB.  HTN --Continue current benazepril, hydralazine, and BB.  HLD --Continue statin.   Pre-diabetes --Hgb A1C 5.8. Consider nutrition consult.  AOCKD --Continue to monitor renal function closely.   For questions or updates, please contact [place] Please consult [email or web address] for contact info under        Signed, Arvil Chaco, PA-C  01/06/2020, 9:43 AM

## 2020-01-06 NOTE — Progress Notes (Signed)
I have reviewed the charting done by the Student-RN and I agree with his assessments.  Charmin Aguiniga A Jc Veron, RN 

## 2020-01-06 NOTE — Progress Notes (Signed)
Alerted Dr. Posey Pronto that patient is complaining of shortness of breath. Oxygen saturation is in the high 90s. 1L02 placed via [place] for comfort. MD stated no chest xray is indicated at this time. Respiratory therapy called to administer treatment for relief of SOB. Will continue to monitor for now.

## 2020-01-06 NOTE — TOC Progression Note (Signed)
Transition of Care [facility]) - Progression Note    Patient Details  Name: Kristy Solomon MRN: [id number] Date of Birth: 1929-09-23  Transition [facility] CM/SW [place], RN Phone Number: 01/06/2020, 12:59 PM  Clinical Narrative:     Cardiology Consult today, Patient set up with [facility] RN for Heart Failure Protocol with [facility] after discharge.   Expected Discharge Plan: Home/Self Care Barriers to Discharge: Continued Medical Work up  Expected Discharge Plan and Services Expected Discharge Plan: Home/Self Care In-house Referral: Clinical Social Work Discharge Planning Services: CM Consult Post Acute Care Choice: NA Living arrangements for the past 2 months: Single Family Home                 DME Arranged: N/A         HH Arranged: RN [place] Agency: [facility] [employer] [facility] [employer] Date [place]: 01/06/20 Time [place]: 1259 Representative spoke with at [place]: [place] ([place] Interventions    Readmission Risk Interventions No flowsheet data found.

## 2020-01-06 NOTE — Progress Notes (Signed)
[facility] [place] NAME: Kristy Solomon    MR#:  [id number]  DATE OF BIRTH:  Oct 18, 1928  SUBJECTIVE:  complains of increasing shortness of breath. Not able to sleep last night. Coughing up greenish phlegm. No fever. Heart rate anywhere from 98-145 did not eat much breakfast. During the room  REVIEW OF SYSTEMS:   Review of Systems  Constitutional: Positive for malaise/fatigue. Negative for chills, fever and weight loss.  HENT: Negative for ear discharge, ear pain and nosebleeds.   Eyes: Negative for blurred vision, pain and discharge.  Respiratory: Positive for cough, sputum production and shortness of breath. Negative for wheezing and stridor.   Cardiovascular: Negative for chest pain, palpitations, orthopnea and PND.  Gastrointestinal: Negative for abdominal pain, diarrhea, nausea and vomiting.  Genitourinary: Negative for frequency and urgency.  Musculoskeletal: Negative for back pain and joint pain.  Neurological: Positive for weakness. Negative for sensory change, speech change and focal weakness.  Psychiatric/Behavioral: Negative for depression and hallucinations. The patient is not nervous/anxious.    Tolerating Diet:yes Tolerating PT: no PT needs  DRUG ALLERGIES:   Allergies  Allergen Reactions  . Eliquis [Apixaban]     abd pain    VITALS:  Blood pressure (!) 156/96, pulse 81, temperature 98.9 F (37.2 C), temperature source Oral, resp. rate 18, height 5\' 4"  (1.626 m), weight 94 kg, SpO2 95 %.  PHYSICAL EXAMINATION:   Physical Exam  GENERAL:  86 y.o.-year-old patient lying in the bed with no acute distress. Obese EYES: Pupils equal, round, reactive to light and accommodation. No scleral icterus.   HEENT: Head atraumatic, normocephalic. Oropharynx and nasopharynx clear.  NECK:  Supple, no jugular venous distention. No thyroid enlargement, no tenderness.  LUNGS:decreased breath sounds bilaterally, no wheezing, rales,  rhonchi. No use of accessory muscles of respiration.  CARDIOVASCULAR: S1, S2 normal. No murmurs, rubs, or gallops. Tachycardia+ ABDOMEN: Soft, nontender, nondistended. Bowel sounds present. No organomegaly or mass.  EXTREMITIES: No cyanosis, clubbing  ++ edema b/l.    NEUROLOGIC: Cranial nerves II through XII are intact. No focal Motor or sensory deficits b/l.  gen weakness PSYCHIATRIC:  patient is alert and oriented x 3.  SKIN: No obvious rash, lesion, or ulcer.   LABORATORY PANEL:  CBC Recent Labs  Lab 01/06/20 0445  WBC 4.6  HGB 14.0  HCT 45.4  PLT 79*    Chemistries  Recent Labs  Lab 12/31/19 0718 01/01/20 0526 01/06/20 0445  NA 141   < > 140  K 4.4   < > 4.4  CL 110   < > 107  CO2 23   < > 26  GLUCOSE 88   < > 103*  BUN 30*   < > 26*  CREATININE 1.56*   < > 1.55*  CALCIUM 8.0*   < > 8.6*  MG  --    < > 2.3  AST 20  --   --   ALT 14  --   --   ALKPHOS 32*  --   --   BILITOT 0.9  --   --    < > = values in this interval not displayed.   Cardiac Enzymes No results for input(s): TROPONINI in the last 168 hours. RADIOLOGY:  DG Chest Port 1 View  Result Date: 01/06/2020 CLINICAL DATA:  Shortness of breath. EXAM: PORTABLE CHEST 1 VIEW COMPARISON:  01/02/2020 FINDINGS: Persistent enlargement of the cardiac silhouette. Aortic atherosclerosis. Increased atelectasis/infiltrate at the left lung  base. The right lung is clear. Pulmonary vascularity is normal. No acute bone abnormality. IMPRESSION: Increasing atelectasis/infiltrate at the left lung base. Aortic Atherosclerosis (ICD10-I70.0). Electronically Signed   By: Lorriane Shire M.D.   On: 01/06/2020 15:00   ASSESSMENT AND PLAN:  Kristy Solomon a 86 y.o.femalewith medical history significant ofhypertension, hyperlipidemia, gout, GERD, lymphoma, CKD-4, thrombocytopenia, CHF, atrial fibrillation not on anticoagulants, who presents with shortness breath and bilateral leg edema. Patient states that she has been having  shortness of breath in the past several days, which has been progressively worsening. She has orthopnea.Shortness breath is getting worse with minimal exertion.  Left lower lobe pneumonia -patient complained of increasing shortness of breath through the night. Feels weak. -Coughing up green phlegm -no fever -chest x-ray shows left lower lobe infiltrate/atelectasis.  -Given shortness of breath, tachycardia, weakness and productive phlegm will start on IV antibiotics with Levaquin. Pharmacy consultation placed.  Acute on chronic diastolic CHF echo on 06/09/2835 showed EF of 60-65%, grade II diastolic dysfunction.  -IV Lasix held as creatinine was trending up-- however creatinine stable of 1.5. I will resume torsemide 20 mg for now increased to 40 mg(home dose) if creatinine remains stable -Repeat chest x-ray negative for fluid overload however showed left lobe infiltrate. -Not requiring supplemental oxygen-- patient got short of breath giving oxygen currently. -Continue strict ins and outs and daily weights -Continue low-salt diet  acute on chronic persistent a. OQH:[id number] tachycardia on ambulation -pt was not taking anticoagulants at home.  -Heart rate 150-160 while ambulation blood pressure soft TSH level diet within normal range -Heart rate is still not under control, continue to monitor on telemetry follow cardiology consult  -cardio recommended to start Xarelto, patient could not tolerate Eliquis in the past, discontinue Cardizem and increase Toprol-XL 50 mg daily and received extra dose of 25 mg metoprolol for elevated heart rate earlier today.  Thrombocytopenia:  This is a known chronic issue however patient had acute worsening thrombocytopenia today And platelet count this morning was 49, down from 75 Upon review of historical data this is below the patient's baseline even back to 2017 Unclear etiology, possible artifact versus lab error Update: On repeat CBC.  Count  up to 84.  Suspect lab error.  Elevated troponin:most likely due to demand ischemia. Continue on tele   Malignant lymphoma, lymphoplasmacytic:  continue Ibrutinib.  Likely the cause of the leukopenia &thrombocytopenia.  F/u with oncology outpatient   HLD: continue on statin   Essential hypertension:  Increased metoprolol from 50 mg p.o. daily d resumed benazepril and hydralazine Restart home torsemide today  AKI on CKD stage IV: Creatinine again stabilized today resume torsemide 20 mg daily  Generalized weakness: seen by PT/OTrecs no PT/OT f/u, plan to discharge home/self-care    DVT prophylaxis: SCDs Code Status: Full Family Communication:  discussed with daughter [name] [phone] on 01/03/2020 Disposition Plan: Anticipate return to previous home environment.  Physical therapy does not recommend any follow-up in their standpoint.   anticipate discharge 1 to 2 days. Patient requiring IV antibiotic for left lower lobe pneumonia. Increased/received additional doses of beta-blocker to get heart rate under control Pt not safe to discharge home  TOTAL TIME TAKING CARE OF THIS PATIENT: *40* minutes.  >50% time spent on counselling and coordination of care  Note: This dictation was prepared with Dragon dictation along with smaller phrase technology. Any transcriptional errors that result from this process are unintentional.  Fritzi Mandes M.D    [facility]   CC:  Primary care physician; Jodi Marble, MDPatient ID: [name], female   DOB: [birth year], 86 y.o.   MRN: [id number]

## 2020-01-06 NOTE — Progress Notes (Addendum)
   Due to report of ventricular rates in the 140s with ambulation, performed additional exam on patient and spoke with both daughter and patient this afternoon.   Per patient, she has felt weak with associated productive cough (green phlegm), sore throat, weakness, and shortness of breath since today. Per review of RN report, started on 1L [place] oxygen for comfort. She reports episode of emesis; however, this was clarified by RN to be her productive green phlegm cough.  Per patient's daughter, she suspects that there is some element of anxiety concerning discharge that is playing into her reported sx.   Consider that ventricular rates will continue to be elevated with movement/ambulation due to deconditioning, anxiety, and current atrial fibrillation. Current ventricular rates are in the 90s to low 100s while talking and 120s when moving in bed. Given room in BP, will escalate her current beta-blocker to metoprolol succinate 75 mg, and if BP and HR tolerate, recommend discharge with metoprolol succinate 100 mg daily for additional HR and BP support.   Current recommendations are to recheck her temperature.   Will also escalate current BB for HR/BP control. [place] cardiology MD to see patient later today.  Signed, Arvil Chaco, PA-C 01/06/2020, 2:39 PM

## 2020-01-06 NOTE — Progress Notes (Signed)
Alerted Dr. Posey Pronto that patient is having some sputum that is thick and green. Patient still feels SOB despite oxygen saturations in the high 90s on room air. Daughter is concerned for weakness. MD ordered a portable CXR and Cepachol lozenge for sore throat. Orders placed. Will continue to monitor patient closely.

## 2020-01-06 NOTE — Progress Notes (Signed)
Alerted Dr. Posey Pronto that patient's BP is elevated at 156/96. No new orders from Md. Will continue to monitor.

## 2020-01-07 ENCOUNTER — Inpatient Hospital Stay: Payer: Medicare HMO

## 2020-01-07 DIAGNOSIS — N1832 Chronic kidney disease, stage 3b: Secondary | ICD-10-CM

## 2020-01-07 DIAGNOSIS — I4819 Other persistent atrial fibrillation: Secondary | ICD-10-CM

## 2020-01-07 LAB — BLOOD GAS, ARTERIAL
Acid-Base Excess: 3.3 mmol/L — ABNORMAL HIGH (ref 0.0–2.0)
Bicarbonate: 27.8 mmol/L (ref 20.0–28.0)
FIO2: 0.36
O2 Saturation: 97.7 %
Patient temperature: 37
pCO2 arterial: 41 mmHg (ref 32.0–48.0)
pH, Arterial: 7.44 (ref 7.350–7.450)
pO2, Arterial: 96 mmHg (ref 83.0–108.0)

## 2020-01-07 LAB — GLUCOSE, CAPILLARY: Glucose-Capillary: 96 mg/dL (ref 70–99)

## 2020-01-07 LAB — BASIC METABOLIC PANEL
Anion gap: 10 (ref 5–15)
BUN: 26 mg/dL — ABNORMAL HIGH (ref 8–23)
CO2: 24 mmol/L (ref 22–32)
Calcium: 8.4 mg/dL — ABNORMAL LOW (ref 8.9–10.3)
Chloride: 104 mmol/L (ref 98–111)
Creatinine, Ser: 1.54 mg/dL — ABNORMAL HIGH (ref 0.44–1.00)
GFR calc Af Amer: 35 mL/min — ABNORMAL LOW (ref >60)
GFR calc non Af Amer: 30 mL/min — ABNORMAL LOW (ref >60)
Glucose, Bld: 113 mg/dL — ABNORMAL HIGH (ref 70–99)
Potassium: 4.2 mmol/L (ref 3.5–5.1)
Sodium: 138 mmol/L (ref 135–145)

## 2020-01-07 LAB — MAGNESIUM: Magnesium: 2.2 mg/dL (ref 1.7–2.4)

## 2020-01-07 LAB — PHOSPHORUS: Phosphorus: 4.1 mg/dL (ref 2.5–4.6)

## 2020-01-07 MED ORDER — GUAIFENESIN ER 600 MG PO TB12
600.0000 mg | ORAL_TABLET | Freq: Two times a day (BID) | ORAL | Status: DC
  Administered 2020-01-08 – 2020-01-09 (×3): 600 mg via ORAL
  Filled 2020-01-07 (×4): qty 1

## 2020-01-07 MED ORDER — TORSEMIDE 20 MG PO TABS
20.0000 mg | ORAL_TABLET | Freq: Every day | ORAL | Status: DC
  Administered 2020-01-07 – 2020-01-08 (×2): 20 mg via ORAL
  Filled 2020-01-07 (×2): qty 1

## 2020-01-07 MED ORDER — METOPROLOL SUCCINATE ER 50 MG PO TB24
75.0000 mg | ORAL_TABLET | Freq: Two times a day (BID) | ORAL | Status: DC
  Administered 2020-01-08 – 2020-01-09 (×3): 75 mg via ORAL
  Filled 2020-01-07 (×4): qty 1

## 2020-01-07 MED ORDER — TORSEMIDE 20 MG PO TABS: 40.0000 mg | ORAL_TABLET | Freq: Every day | ORAL | Status: DC

## 2020-01-07 NOTE — Evaluation (Signed)
Physical Therapy Re-Evaluation Patient Details Name: Kristy Solomon MRN: [id number] DOB: 1928/10/05 Today's Date: 01/07/2020   History of Present Illness  Pt admitted for acute/chronic CHF with complaints of SOB and B LE edema.  Pt noted with L LL PNA and placed on O2 01/06/20.  History includes HTN, gout, GERD, CHF, and Afib.  Pt very active at baseline  Clinical Impression  Prior to hospital admission, pt was independent with ambulation; lives alone in 1 level home with 3 steps to enter (B railings).  Currently pt is modified independent with bed mobility; CGA with transfers; and CGA with ambulation up to 60 feet with UE support (see below for details).  HR 96-100 bpm at rest and increased up to 127 bpm with ambulation; O2 sats briefly 87-88% with coughing but mostly 92% or greater on 4 L O2 via nasal cannula during sessions activities.  Pt appearing with decreased strength and activity tolerance compared to previous PT evaluation 01/01/20.   Pt would benefit from skilled PT to address noted impairments and functional limitations (see below for any additional details).  Upon hospital discharge, pt would benefit from [place].    Follow Up Recommendations Home health PT    Equipment Recommendations  Rolling walker with 5" wheels    Recommendations for Other Services       Precautions / Restrictions Precautions Precautions: Fall Restrictions Weight Bearing Restrictions: No      Mobility  Bed Mobility Overal bed mobility: Modified Independent             General bed mobility comments: Semi-supine to/from sitting with mild increased effort  Transfers Overall transfer level: Needs assistance Equipment used: None Transfers: Sit to/from Stand Sit to Stand: Min guard         General transfer comment: mild increased effort to stand on own from bed x3 trials  Ambulation/Gait Ambulation/Gait assistance: Min guard Gait Distance (Feet): (40 feet; 20 feet; 60 feet) Assistive device:  IV Pole;Rolling walker (2 wheeled)   Gait velocity: decreased   General Gait Details: partial step through gait pattern; utilized single UE support on rolling vitals pole for 40 feet and then utilized RW for next 2 trials; pt appearing steady with RW use  Financial trader Rankin (Stroke Patients Only)       Balance Overall balance assessment: Needs assistance Sitting-balance support: No upper extremity supported;Feet supported Sitting balance-Leahy Scale: Normal Sitting balance - Comments: steady sitting reaching within BOS   Standing balance support: No upper extremity supported Standing balance-Leahy Scale: Good Standing balance comment: steady standing reaching within BOS                             Pertinent Vitals/Pain Pain Assessment: No/denies pain    Home Living Family/patient expects to be discharged to:: Private residence Living Arrangements: Alone Available Help at Discharge: Family;Available PRN/intermittently Type of Home: House Home Access: Stairs to enter Entrance Stairs-Rails: Right;Left;Can reach both Entrance Stairs-Number of Steps: 3 Home Layout: One level Home Equipment: Grab bars - tub/shower;Grab bars - toilet Additional Comments: has elevated toilet seat    Prior Function Level of Independence: Independent         Comments: Independent with ADL's.  (-) driving.  Family checks in on pt and assists with errands and groceries.  No falls in past 12 months.     Hand Dominance  Extremity/Trunk Assessment   Upper Extremity Assessment Upper Extremity Assessment: Generalized weakness    Lower Extremity Assessment Lower Extremity Assessment: Generalized weakness    Cervical / Trunk Assessment Cervical / Trunk Assessment: Normal  Communication   Communication: No difficulties  Cognition Arousal/Alertness: Awake/alert Behavior During Therapy: WFL for tasks  assessed/performed Overall Cognitive Status: Within Functional Limits for tasks assessed                                        General Comments   Nursing cleared pt for participation in physical therapy.  Pt agreeable to PT session.    Exercises     Assessment/Plan    PT Assessment Patient needs continued PT services  PT Problem List Decreased strength;Decreased activity tolerance;Decreased balance;Decreased mobility;Decreased knowledge of use of DME;Cardiopulmonary status limiting activity       PT Treatment Interventions DME instruction;Gait training;Stair training;Functional mobility training;Therapeutic activities;Therapeutic exercise;Balance training;Patient/family education    PT Goals (Current goals can be found in the Care Plan section)  Acute Rehab PT Goals Patient Stated Goal: to go home PT Goal Formulation: With patient Time For Goal Achievement: 01/21/20 Potential to Achieve Goals: Good    Frequency Min 2X/week   Barriers to discharge        Co-evaluation               AM-PAC PT "6 Clicks" Mobility  Outcome Measure Help needed turning from your back to your side while in a flat bed without using bedrails?: None Help needed moving from lying on your back to sitting on the side of a flat bed without using bedrails?: None Help needed moving to and from a bed to a chair (including a wheelchair)?: A Little Help needed standing up from a chair using your arms (e.g., wheelchair or bedside chair)?: A Little Help needed to walk in hospital room?: A Little Help needed climbing 3-5 steps with a railing? : A Little 6 Click Score: 20    End of Session Equipment Utilized During Treatment: Gait belt;Oxygen(4 L O2 via nasal cannula) Activity Tolerance: Patient limited by fatigue Patient left: in bed;with call bell/phone within reach;with bed alarm set Nurse Communication: Mobility status;Precautions;Other (comment)(pt's HR and O2 during session) PT  Visit Diagnosis: Other abnormalities of gait and mobility (R26.89);Unsteadiness on feet (R26.81);Muscle weakness (generalized) (M62.81)    Time: 9675-9163 PT Time Calculation (min) (ACUTE ONLY): 30 min   Charges:   PT Evaluation $PT Re-evaluation: 1 Re-eval PT Treatments $Therapeutic Exercise: 8-22 mins        Leitha Bleak, PT 01/07/20, 2:26 PM

## 2020-01-07 NOTE — Progress Notes (Signed)
[place] at [place] NAME: Kristy Solomon    MR#:  [id number]  DATE OF BIRTH:  [birth year]  SUBJECTIVE:  patient out in the chair. Slept much better. Coughing up green slime. No respiratory distress. Feels a lot better than yesterday. Was kept on 4 L oxygen for symptom management. Sats are more than 95%.  REVIEW OF SYSTEMS:   Review of Systems  Constitutional: Negative for chills, fever and weight loss.  HENT: Negative for ear discharge, ear pain and nosebleeds.   Eyes: Negative for blurred vision, pain and discharge.  Respiratory: Positive for cough and sputum production. Negative for wheezing and stridor.   Cardiovascular: Negative for chest pain, palpitations, orthopnea and PND.  Gastrointestinal: Negative for abdominal pain, diarrhea, nausea and vomiting.  Genitourinary: Negative for frequency and urgency.  Musculoskeletal: Negative for back pain and joint pain.  Neurological: Positive for weakness. Negative for sensory change, speech change and focal weakness.  Psychiatric/Behavioral: Negative for depression and hallucinations. The patient is not nervous/anxious.    Tolerating Diet:yes Tolerating PT: no PT needs--having another re-eval today  DRUG ALLERGIES:   Allergies  Allergen Reactions  . Eliquis [Apixaban]     abd pain    VITALS:  Blood pressure (!) 158/84, pulse 88, temperature 98.8 F (37.1 C), temperature source Oral, resp. rate 20, height 5\' 4"  (1.626 m), weight 90.4 kg, SpO2 99 %.  PHYSICAL EXAMINATION:   Physical Exam  GENERAL:  86 y.o.-year-old patient lying in the bed with no acute distress. Obese EYES: Pupils equal, round, reactive to light and accommodation. No scleral icterus.   HEENT: Head atraumatic, normocephalic. Oropharynx and nasopharynx clear.  NECK:  Supple, no jugular venous distention. No thyroid enlargement, no tenderness.  LUNGS:decreased breath sounds bilaterally, no wheezing, rales, rhonchi. No  use of accessory muscles of respiration.  CARDIOVASCULAR: S1, S2 normal. No murmurs, rubs, or gallops. Tachycardia+ ABDOMEN: Soft, nontender, nondistended. Bowel sounds present. No organomegaly or mass.  EXTREMITIES: No cyanosis, clubbing  ++ edema b/l.    NEUROLOGIC: Cranial nerves II through XII are intact. No focal Motor or sensory deficits b/l.  gen weakness PSYCHIATRIC:  patient is alert and oriented x 3.  SKIN: No obvious rash, lesion, or ulcer.   LABORATORY PANEL:  CBC Recent Labs  Lab 01/06/20 0445  WBC 4.6  HGB 14.0  HCT 45.4  PLT 79*    Chemistries  Recent Labs  Lab 01/06/20 0445 01/06/20 0445 01/07/20 0424  NA 140  --   --   K 4.4  --   --   CL 107  --   --   CO2 26  --   --   GLUCOSE 103*  --   --   BUN 26*  --   --   CREATININE 1.55*  --   --   CALCIUM 8.6*  --   --   MG 2.3   < > 2.2   < > = values in this interval not displayed.   Cardiac Enzymes No results for input(s): TROPONINI in the last 168 hours. RADIOLOGY:  DG Chest Port 1 View  Result Date: 01/06/2020 CLINICAL DATA:  Shortness of breath. EXAM: PORTABLE CHEST 1 VIEW COMPARISON:  01/02/2020 FINDINGS: Persistent enlargement of the cardiac silhouette. Aortic atherosclerosis. Increased atelectasis/infiltrate at the left lung base. The right lung is clear. Pulmonary vascularity is normal. No acute bone abnormality. IMPRESSION: Increasing atelectasis/infiltrate at the left lung base. Aortic Atherosclerosis (ICD10-I70.0). Electronically Signed  By: Lorriane Shire M.D.   On: 01/06/2020 15:00   ASSESSMENT AND PLAN:  Kristy Solomon a 86 y.o.femalewith medical history significant ofhypertension, hyperlipidemia, gout, GERD, lymphoma, CKD-4, thrombocytopenia, CHF, atrial fibrillation not on anticoagulants, who presents with shortness breath and bilateral leg edema. Patient states that she has been having shortness of breath in the past several days, which has been progressively worsening. She has  orthopnea.Shortness breath is getting worse with minimal exertion.  Left lower lobe pneumonia -patient complained of increasing shortness of breath through the night. Feels weak. -Coughing up green phlegm -chest x-ray shows left lower lobe infiltrate/atelectasis.  -Given shortness of breath, tachycardia, weakness and productive phlegm will start on IV antibiotics with Levaquin. Pharmacy consultation placed. -remains afebrile  Acute on chronic diastolic CHF echo on 10/08/1094 showed EF of 60-65%, grade II diastolic dysfunction.  -IV Lasix held as creatinine was trending up-- however creatinine stable of 1.5. I will resume torsemide 20 mg for now increased to 40 mg(home dose) if creatinine remains stable -Repeat chest x-ray negative for fluid overload however showed left lobe infiltrate. -Not requiring supplemental oxygen-- patient got short of breath giving oxygen currently --wean to RA -Continue strict ins and outs and daily weights -Continue low-salt diet  acute on chronic persistent a. EAV:[id number] tachycardia on ambulation -pt was not taking anticoagulants at home.  -Heart rate much improved after increasing BB dose -TSH level diet within normal range -[facility] cardio recommended to start Xarelto, patient could not tolerate Eliquis in the past, -discontinue Cardizem and increase Toprol-XL 100 mg daily  -cont lotensin  Thrombocytopenia:  This is a known chronic issue however patient had acute worsening thrombocytopenia today plt count 79K  Elevated troponin:most likely due to demand ischemia.   Malignant lymphoma, lymphoplasmacytic:  -continue Ibrutinib.  -Likely the cause of the leukopenia &thrombocytopenia.  -F/u with oncology outpatient   HLD: continue on statin   Essential hypertension:  Increased metoprolol 100 mg p.o. daily  resumed benazepril and hydralazine Restart home torsemide today  AKI on CKD stage IV: Creatinine again stabilized 1.55 resume  torsemide 20 mg daily  Generalized weakness: seen by PT/OTrecs no PT/OT f/u, plan to discharge home/self-care -will have PT re-evaluate  DVT prophylaxis: SCDs Code Status: Full Family Communication:  discussed with daughter Kristy Solomon [phone] on 01/07/2020 Disposition Plan: Anticipate return to previous home environment 01/08/2020.  PT to re-eval today given change in pt's status Cont IV levaquin for penumonia   TOTAL TIME TAKING CARE OF THIS PATIENT: *30* minutes.  >50% time spent on counselling and coordination of care  Note: This dictation was prepared with Dragon dictation along with smaller phrase technology. Any transcriptional errors that result from this process are unintentional.  Fritzi Mandes M.D    [facility]   CC: Primary care physician; Jodi Marble, MDPatient ID: Kristy Solomon, female   DOB: [birth year], 86 y.o.   MRN: [id number]

## 2020-01-07 NOTE — Progress Notes (Signed)
Notified Dr. Posey Pronto that patient just started having pink sputum. Also told MD that patient is requiring at least 2 liters o2 or else she desats. Patient is not in any distress. MD responded that due to her creatinine being elevated, we can't do a CT. MD stated we will continue to monitor patient for now. Will monitor patient closely.

## 2020-01-07 NOTE — Progress Notes (Signed)
Alerted Dr. Posey Pronto that patient's BP was 158/74 HR of 88. Told MD that per night shift, patient's BP had gotten soft after metoprolol. MD ordered to hold hydralzine and benazapril, and give metoprolol. Will administer and monitor patient closely.

## 2020-01-07 NOTE — Progress Notes (Signed)
Progress Note  Patient Name: Kristy Solomon Date of Encounter: 01/07/2020  Primary Cardiologist: Kate Sable, MD  Subjective   Breathing improved but still not back to baseline.  No chest pain or leg swelling.  Productive cough noted.  Inpatient Medications    Scheduled Meds: . allopurinol  200 mg Oral Daily  . atorvastatin  20 mg Oral Daily  . benazepril  20 mg Oral Daily  . dextromethorphan-guaiFENesin  1 tablet Oral BID  . docusate sodium  200 mg Oral BID  . gabapentin  200 mg Oral TID  . hydrALAZINE  25 mg Oral TID  . Ibrutinib  420 mg Oral Daily  . metoprolol succinate  100 mg Oral Daily  . polyethylene glycol  17 g Oral Daily  . rivaroxaban  15 mg Oral Q supper  . sodium chloride flush  3 mL Intravenous Q12H  . torsemide  20 mg Oral Daily   Continuous Infusions: . sodium chloride Stopped (01/06/20 1814)  . levofloxacin (LEVAQUIN) IV 750 mg (01/06/20 1814)   PRN Meds: sodium chloride, acetaminophen, albuterol, ipratropium, menthol-cetylpyridinium, ondansetron (ZOFRAN) IV, sodium chloride flush   Vital Signs    Vitals:   01/07/20 0330 01/07/20 0400 01/07/20 0731 01/07/20 1000  BP: 92/71 123/86 (!) 158/84 109/80  Pulse: (!) 58 (!) 59 88 83  Resp: 20  20 20   Temp: 98.9 F (37.2 C)  98.8 F (37.1 C)   TempSrc: Oral  Oral   SpO2: 100%  99% 91%  Weight: 90.4 kg     Height:        Intake/Output Summary (Last 24 hours) at 01/07/2020 1040 Last data filed at 01/07/2020 1034 Gross per 24 hour  Intake 360.21 ml  Output 1250 ml  Net -889.79 ml   Last 3 Weights 01/07/2020 01/06/2020 01/05/2020  Weight (lbs) 199 lb 4.8 oz 207 lb 3.2 oz 206 lb 9.6 oz  Weight (kg) 90.402 kg 93.985 kg 93.713 kg      Telemetry    A-fib with PVC's versus aberrancy.  Ventricular rates mostly 90-110 bpm, few spikes up to 140 bpm. - Personally Reviewed  ECG    No new tracing.  Physical Exam   GEN: No acute distress.  Somnolent but arousible. Neck: JVP ~8 cm. Cardiac: IRRR w/o  murmurs. Respiratory: Mildly diminished breath sounds at the lung bases. GI: Soft, nontender, non-distended  MS: No significant LE edema. Neuro:  Nonfocal  Psych: Normal affect   Labs    High Sensitivity Troponin:   Recent Labs  Lab 12/31/19 0718 12/31/19 0853 12/31/19 1110 12/31/19 1350 12/31/19 1637  TROPONINIHS 43* 50* 59* 51* 49*      Chemistry Recent Labs  Lab 01/04/20 0324 01/05/20 0520 01/06/20 0445  NA 140 139 140  K 4.1 4.1 4.4  CL 105 107 107  CO2 28 25 26   GLUCOSE 102* 96 103*  BUN 30* 27* 26*  CREATININE 1.61* 1.59* 1.55*  CALCIUM 8.3* 8.7* 8.6*  GFRNONAA 28* 29* 30*  GFRAA 33* 33* 34*  ANIONGAP 7 7 7      Hematology Recent Labs  Lab 01/03/20 1413 01/05/20 0520 01/06/20 0445  WBC 4.1 3.5* 4.6  RBC 4.91 5.00 5.41*  HGB 12.7 13.0 14.0  HCT 42.1 42.7 45.4  MCV 85.7 85.4 83.9  MCH 25.9* 26.0 25.9*  MCHC 30.2 30.4 30.8  RDW 15.9* 16.1* 16.6*  PLT 84* 77* 79*    BNPNo results for input(s): BNP, PROBNP in the last 168 hours.   DDimer  No results for input(s): DDIMER in the last 168 hours.   Radiology    DG Chest Port 1 View  Result Date: 01/06/2020 CLINICAL DATA:  Shortness of breath. EXAM: PORTABLE CHEST 1 VIEW COMPARISON:  01/02/2020 FINDINGS: Persistent enlargement of the cardiac silhouette. Aortic atherosclerosis. Increased atelectasis/infiltrate at the left lung base. The right lung is clear. Pulmonary vascularity is normal. No acute bone abnormality. IMPRESSION: Increasing atelectasis/infiltrate at the left lung base. Aortic Atherosclerosis (ICD10-I70.0). Electronically Signed   By: Lorriane Shire M.D.   On: 01/06/2020 15:00    Cardiac Studies   TTE (12/31/19): 1. Left ventricular ejection fraction, by estimation, is 50 to 55%. The  left ventricle has low normal function. The left ventricle has no regional  wall motion abnormalities. There is mild left ventricular hypertrophy.  Left ventricular diastolic  parameters are indeterminate.   2. Right ventricular systolic function is normal. The right ventricular  size is normal. Tricuspid regurgitation signal is inadequate for assessing  PA pressure.  3. Left atrial size was mildly dilated.  4. Right atrial size was mildly dilated.  5. The mitral valve is abnormal. Mild mitral valve regurgitation. No  evidence of mitral stenosis.  6. Tricuspid valve regurgitation is mild to moderate.  7. The aortic valve is tricuspid. Aortic valve regurgitation is not  visualized. No aortic stenosis is present.  8. The inferior vena cava is dilated in size with <50% respiratory  variability, suggesting right atrial pressure of 15 mmHg.   Patient Profile     86 y.o. female history of HFpEF, persistent atrial fibrillation, HTN, HLD, lymphoma, CKD, chronic LEE, and anemia, and who is being seen today for the evaluation of volume overload.  Assessment & Plan    Persistent atrial fibrillation: Ventricular rates reasonable at rest (goal < 110 bpm), though some spikes with activity into the 140's.  Switch metoprolol succinate to 75 mg BID (from 100 mg daily).  I will stop hydralazine to minimize risk for hypotension.  Continue rivaroxaban.15 mg daily, given GFR < 50.  Acute on chronic HFpEF: Pt still appears somewhat volume overloaded.  Agree with restarting gentle diuresis with torsemide 20 mg daily.  Add on BMP to today's lab.  Acute respiratory failure with hypoxia: Likely multifactorial, including HF and possible developing PNA.  Gentle diuresis.  Antibiotics per IM.  Wean supplemental O2 as tolerated.  CKD stage 3: Creatinine near baseline on last check yesterday.  Add on BMP today in the setting of diuresis.  Avoid nephrotoxic drugs.  For questions or updates, please contact [place] Please consult [email or web address] for contact info under [facility] Cardiology.  Signed, Nelva Bush, MD  01/07/2020, 10:40 AM

## 2020-01-07 NOTE — Progress Notes (Signed)
RRT called at 2125 after pt found lethargic and unable to arouse.  Meds held.  CXR, ABG's and labs drawn.  Pt became alert furing the RRT during RRT blood draw.  Pt alert and oriented x 4, said she was "sleepy" but BP low as well at time.  Situation confirmed with charge nurse and SV before calling RRT.  Awaiting resluts.  Pt just now took her nighttime meds minus her metoprolol w/o difficulty.  BP now 107/94, O2 sat 97% on 4 liters [place].

## 2020-01-07 NOTE — Progress Notes (Signed)
Rapid Response Event Note  Overview:pt lying in bed lethargic, nurses/RT and A/C at bedside       Initial Focused Assessment: pt has minimal arousal to sternal rub, pt on 4lnc sats 92% no resp. Distress noted. CGB obtained 96. Pt has soft BP 90's/50"s.   Interventions:the care nurse has notified the NP. And orders were placed for a chest xray and ABG. Pt became more arousalable with ABG and was able to answer all questions appropriately.   Plan of Care (if not transferred) Ouma NP came to bedside to assess, pt not lethargic at this time . Will monitor oxygenation,and awaiting labs and diagnostic results  Event Summary:   at  2133    at          [facility]

## 2020-01-08 DIAGNOSIS — I10 Essential (primary) hypertension: Secondary | ICD-10-CM

## 2020-01-08 LAB — BASIC METABOLIC PANEL
Anion gap: 8 (ref 5–15)
BUN: 44 mg/dL — ABNORMAL HIGH (ref 8–23)
CO2: 25 mmol/L (ref 22–32)
Calcium: 7.8 mg/dL — ABNORMAL LOW (ref 8.9–10.3)
Chloride: 104 mmol/L (ref 98–111)
Creatinine, Ser: 2.52 mg/dL — ABNORMAL HIGH (ref 0.44–1.00)
GFR calc Af Amer: 19 mL/min — ABNORMAL LOW (ref >60)
GFR calc non Af Amer: 16 mL/min — ABNORMAL LOW (ref >60)
Glucose, Bld: 83 mg/dL (ref 70–99)
Potassium: 4.1 mmol/L (ref 3.5–5.1)
Sodium: 137 mmol/L (ref 135–145)

## 2020-01-08 LAB — CBC WITH DIFFERENTIAL/PLATELET
Abs Immature Granulocytes: 0.04 10*3/uL (ref 0.00–0.07)
Basophils Absolute: 0 10*3/uL (ref 0.0–0.1)
Basophils Relative: 0 %
Eosinophils Absolute: 0 10*3/uL (ref 0.0–0.5)
Eosinophils Relative: 0 %
HCT: 38.6 % (ref 36.0–46.0)
Hemoglobin: 11.8 g/dL — ABNORMAL LOW (ref 12.0–15.0)
Immature Granulocytes: 0 %
Lymphocytes Relative: 15 %
Lymphs Abs: 1.4 10*3/uL (ref 0.7–4.0)
MCH: 26.1 pg (ref 26.0–34.0)
MCHC: 30.6 g/dL (ref 30.0–36.0)
MCV: 85.4 fL (ref 80.0–100.0)
Monocytes Absolute: 0.9 10*3/uL (ref 0.1–1.0)
Monocytes Relative: 10 %
Neutro Abs: 6.7 10*3/uL (ref 1.7–7.7)
Neutrophils Relative %: 75 %
Platelets: 69 10*3/uL — ABNORMAL LOW (ref 150–400)
RBC: 4.52 MIL/uL (ref 3.87–5.11)
RDW: 16.3 % — ABNORMAL HIGH (ref 11.5–15.5)
WBC: 9 10*3/uL (ref 4.0–10.5)
nRBC: 0 % (ref 0.0–0.2)

## 2020-01-08 LAB — PROCALCITONIN: Procalcitonin: 0.53 ng/mL

## 2020-01-08 LAB — MAGNESIUM: Magnesium: 2.2 mg/dL (ref 1.7–2.4)

## 2020-01-08 MED ORDER — LEVOFLOXACIN IN D5W 500 MG/100ML IV SOLN
500.0000 mg | INTRAVENOUS | Status: DC
  Administered 2020-01-08: 500 mg via INTRAVENOUS
  Filled 2020-01-08: qty 100

## 2020-01-08 MED ORDER — ORAL CARE MOUTH RINSE: 15.0000 mL | Freq: Two times a day (BID) | OROMUCOSAL | Status: DC

## 2020-01-08 MED ORDER — SODIUM CHLORIDE 0.9 % IV SOLN: INTRAVENOUS | Status: DC

## 2020-01-08 MED ORDER — BENAZEPRIL HCL 5 MG PO TABS
5.0000 mg | ORAL_TABLET | Freq: Every day | ORAL | Status: DC
  Administered 2020-01-08: 5 mg via ORAL
  Filled 2020-01-08: qty 1

## 2020-01-08 NOTE — TOC Initial Note (Addendum)
Transition of Care [facility]) - Initial/Assessment Note    Patient Details  Name: Kristy Solomon MRN: [id number] Date of Birth: 07-Feb-1929  [facility] CM/SW Contact:    Eileen Stanford, LCSW Phone Number: 01/08/2020, 10:50 AM  Clinical Narrative:   Pt is alert and oriented. CSW spoke with pt at bedside. Pt states she is agreeable to HH--and agreeable to previous agency she used. Pt states she has had HH in the past. According to previous admission pt is active with [facility]. CSW will follow up with [facility] for resumption of services. Pt states she has a walker at home.   1.  Do you have a scale? Yes  2.  Do you weigh yourself daily?  Yes 3.  Two pounds over night or 5 in one week call your doctor because it could be fluid overload? Aware 4.  Do you go to the Heart Failure Clinic? Yes 5. An appointment with heart failure clinic will be made before discharge if. 6. If patient is home bound, home health can be arranged with an agency that provides a heart failure protocol.              Expected Discharge Plan: [place] Barriers to Discharge: Continued Medical Work up   Patient Goals and CMS Choice Patient states their goals for this hospitalization and ongoing recovery are:: to go home      Expected Discharge Plan and Services Expected Discharge Plan: [place] In-house Referral: Clinical Social Work Discharge Planning Services: CM Consult Post Acute Care Choice: [place] arrangements for the past 2 months: Single Family Home                 DME Arranged: N/A         HH Arranged: RN [place] Agency: [facility] [employer] Date [place]: 01/08/20 Time [place]: [place] Representative spoke with at [place]: [place] Arrangements/Services Living arrangements for the past 2 months: [place] Lives with:: Self Patient language and need for  interpreter reviewed:: Yes Do you feel safe going back to the place where you live?: Yes      Need for Family Participation in Patient Care: Yes (Comment) Care giver support system in place?: Yes (comment) Current home services: DME(walker, but is rarely iused) Criminal Activity/Legal Involvement Pertinent to Current Situation/Hospitalization: No - Comment as needed  Activities of Daily Living Home Assistive Devices/Equipment: Eyeglasses, Shower chair without back, Environmental consultant (specify type) ADL Screening (condition at time of admission) Patient's cognitive ability adequate to safely complete daily activities?: Yes Is the patient deaf or have difficulty hearing?: No Does the patient have difficulty seeing, even when wearing glasses/contacts?: No Does the patient have difficulty concentrating, remembering, or making decisions?: No Patient able to express need for assistance with ADLs?: Yes Does the patient have difficulty dressing or bathing?: No Independently performs ADLs?: Yes (appropriate for developmental age) Does the patient have difficulty walking or climbing stairs?: No Weakness of Legs: None Weakness of Arms/Hands: None  Permission Sought/Granted Permission sought to share information with : Family Supports Permission granted to share information with : Yes, Verbal Permission Granted  Share Information with NAME: Baker Janus  Permission granted to share info w AGENCY: [facility]  Permission granted to share info w Relationship: Granddaughter  Permission granted to share info w Contact Information: [phone]  Emotional Assessment Appearance:: Appears stated age Attitude/Demeanor/Rapport:  Engaged Affect (typically observed): Accepting, Appropriate, Calm Orientation: : Oriented to Situation, Oriented to  Time, Oriented to Place, Oriented to Self Alcohol / Substance Use: Not Applicable Psych Involvement: No (comment)  Admission diagnosis:  Peripheral edema [R60.9] Acute on chronic  diastolic (congestive) heart failure (HCC) [I50.33] Acute on chronic congestive heart failure, unspecified heart failure type (HCC) [I50.9] Acute decompensated heart failure (HCC) [I50.9] Acute on chronic diastolic heart failure (HCC) [I50.33] Patient Active Problem List   Diagnosis Date Noted  . Persistent atrial fibrillation ([place])   . Chronic kidney disease, stage 3b   . Acute on chronic heart failure with preserved ejection fraction (HFpEF) ([place]) 01/06/2020  . Acute decompensated heart failure ([place]) 01/03/2020  . Acute on chronic diastolic CHF (congestive heart failure) ([place]) 12/31/2019  . CKD (chronic kidney disease), stage IV ([place]) 12/31/2019  . Atrial fibrillation, chronic ([place]) 12/31/2019  . Elevated troponin 12/31/2019  . Renal insufficiency 07/28/2019  . Bilateral edema of lower extremity 05/07/2019  . HLD (hyperlipidemia) 09/18/2018  . HTN (hypertension) 09/18/2018  . GERD (gastroesophageal reflux disease) 09/18/2018  . Back pain 09/18/2018  . Bradycardia 09/18/2018  . Bigeminy 09/18/2018  . Nephrolithiasis 01/11/2018  . Goals of care, counseling/discussion 12/28/2017  . Gastroenteritis 12/28/2017  . B12 deficiency 03/18/2016  . Folate deficiency 03/18/2016  . Iron deficiency anemia 03/18/2016  . Pneumonia 03/17/2016  . Pressure ulcer 03/17/2016  . Thrombocytopenia ([place]) 07/04/2014  . Acute renal failure ([place]) 07/04/2014  . Hyperuricemia 07/04/2014  . 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 Calculi, ureter 07/03/2012  . Frank hematuria 07/03/2012  . Hydronephrosis 07/03/2012  . 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 Calculus of kidney 07/02/2012  . Nonspecific finding on examination of urine 07/02/2012   PCP:  Jodi Marble, MD Pharmacy:   [facility] [place], [place] - [address] [facility] [address] [place] [address] Phone: [phone] Fax: [place], [place] - [address] [place] [address] Phone: [phone] Fax: [phone]     Social Determinants of Health (SDOH) Interventions    Readmission Risk Interventions No flowsheet data found.

## 2020-01-08 NOTE — Progress Notes (Signed)
Progress Note  Patient Name: Kristy Solomon Date of Encounter: 01/08/2020  Primary Cardiologist: Dr. Garen Lah  Subjective   States doing okay, edema has improved, denies fevers.  Has occasional cough.  On antibiotics for pneumonia.  Inpatient Medications    Scheduled Meds: . allopurinol  200 mg Oral Daily  . atorvastatin  20 mg Oral Daily  . benazepril  5 mg Oral Daily  . dextromethorphan-guaiFENesin  1 tablet Oral BID  . docusate sodium  200 mg Oral BID  . gabapentin  200 mg Oral TID  . guaiFENesin  600 mg Oral BID  . Ibrutinib  420 mg Oral Daily  . metoprolol succinate  75 mg Oral BID  . polyethylene glycol  17 g Oral Daily  . rivaroxaban  15 mg Oral Q supper  . sodium chloride flush  3 mL Intravenous Q12H   Continuous Infusions: . sodium chloride Stopped (01/06/20 1814)  . sodium chloride    . levofloxacin (LEVAQUIN) IV     PRN Meds: sodium chloride, acetaminophen, albuterol, ipratropium, menthol-cetylpyridinium, ondansetron (ZOFRAN) IV, sodium chloride flush   Vital Signs    Vitals:   01/08/20 0039 01/08/20 0414 01/08/20 0444 01/08/20 0739  BP:  (!) 108/49  97/67  Pulse:  (!) 50 89 62  Resp:  (!) 22 18 19   Temp:  99.3 F (37.4 C)  98.4 F (36.9 C)  TempSrc:  Oral  Oral  SpO2: 91% 100%  100%  Weight:      Height:        Intake/Output Summary (Last 24 hours) at 01/08/2020 1147 Last data filed at 01/08/2020 1012 Gross per 24 hour  Intake 480 ml  Output --  Net 480 ml   Last 3 Weights 01/07/2020 01/06/2020 01/05/2020  Weight (lbs) 199 lb 4.8 oz 207 lb 3.2 oz 206 lb 9.6 oz  Weight (kg) 90.402 kg 93.985 kg 93.713 kg      Telemetry    A. fib, heart rate 103- Personally Reviewed  ECG    No new tracing obtained- Personally Reviewed  Physical Exam   GEN: No acute distress.   Neck: No JVD Cardiac:  Irregularly irregular, no murmurs, rubs, or gallops.  Respiratory:  Rhonchorous breath sounds at bases GI: Soft, nontender, non-distended  MS: trace  edema; No deformity. Neuro:  Nonfocal  Psych: Normal affect   Labs    High Sensitivity Troponin:   Recent Labs  Lab 12/31/19 0718 12/31/19 0853 12/31/19 1110 12/31/19 1350 12/31/19 1637  TROPONINIHS 43* 50* 59* 51* 49*      Chemistry Recent Labs  Lab 01/06/20 0445 01/07/20 0424 01/08/20 0515  NA 140 138 137  K 4.4 4.2 4.1  CL 107 104 104  CO2 26 24 25   GLUCOSE 103* 113* 83  BUN 26* 26* 44*  CREATININE 1.55* 1.54* 2.52*  CALCIUM 8.6* 8.4* 7.8*  GFRNONAA 30* 30* 16*  GFRAA 34* 35* 19*  ANIONGAP 7 10 8      Hematology Recent Labs  Lab 01/05/20 0520 01/06/20 0445 01/08/20 0515  WBC 3.5* 4.6 9.0  RBC 5.00 5.41* 4.52  HGB 13.0 14.0 11.8*  HCT 42.7 45.4 38.6  MCV 85.4 83.9 85.4  MCH 26.0 25.9* 26.1  MCHC 30.4 30.8 30.6  RDW 16.1* 16.6* 16.3*  PLT 77* 79* 69*    BNPNo results for input(s): BNP, PROBNP in the last 168 hours.   DDimer No results for input(s): DDIMER in the last 168 hours.   Radiology    DG Chest  1 View  Result Date: 01/07/2020 CLINICAL DATA:  Sudden onset of shortness of breath. EXAM: CHEST  1 VIEW COMPARISON:  01/06/2020 FINDINGS: There is a worsening right lower lobe airspace opacity. There is a persistent dense retrocardiac opacity. There is cardiomegaly with prominent interstitial lung markings. There is no pneumothorax. There may be small bilateral pleural effusions. Aortic calcifications are noted. IMPRESSION: 1. Worsening bibasilar airspace opacities concerning for pneumonia. 2. Cardiomegaly with vascular congestion. Electronically Signed   By: Constance Holster M.D.   On: 01/07/2020 22:03   DG Chest Port 1 View  Result Date: 01/06/2020 CLINICAL DATA:  Shortness of breath. EXAM: PORTABLE CHEST 1 VIEW COMPARISON:  01/02/2020 FINDINGS: Persistent enlargement of the cardiac silhouette. Aortic atherosclerosis. Increased atelectasis/infiltrate at the left lung base. The right lung is clear. Pulmonary vascularity is normal. No acute bone  abnormality. IMPRESSION: Increasing atelectasis/infiltrate at the left lung base. Aortic Atherosclerosis (ICD10-I70.0). Electronically Signed   By: Lorriane Shire M.D.   On: 01/06/2020 15:00    Cardiac Studies   Transthoracic echo 12/31/2019 1. Left ventricular ejection fraction, by estimation, is 50 to 55%. The  left ventricle has low normal function. The left ventricle has no regional  wall motion abnormalities. There is mild left ventricular hypertrophy.  Left ventricular diastolic  parameters are indeterminate.  2. Right ventricular systolic function is normal. The right ventricular  size is normal. Tricuspid regurgitation signal is inadequate for assessing  PA pressure.  3. Left atrial size was mildly dilated.  4. Right atrial size was mildly dilated.  5. The mitral valve is abnormal. Mild mitral valve regurgitation. No  evidence of mitral stenosis.  6. Tricuspid valve regurgitation is mild to moderate.  7. The aortic valve is tricuspid. Aortic valve regurgitation is not  visualized. No aortic stenosis is present.  8. The inferior vena cava is dilated in size with <50% respiratory  variability, suggesting right atrial pressure of 15 mmHg.   Patient Profile     86 y.o. female with history of persistent A. fib, hypertension, hyperlipidemia, CKD, presents due to worsening edema.  Found to be tachycardic with A. fib, chest x-ray concerning for pneumonia.  Assessment & Plan    1.  Persistent atrial fibrillation -Tachycardia likely driven by underlying infection/pneumonia -Continue Toprol XL at current dose of 75 mg twice daily. -I will stop benazepril due to acute kidney injury and low normal blood pressures -Continue Xarelto 15 mg daily.  2.  History of heart failure preserved EF -Torsemide 20 daily -Consider holding if creatinine stays elevated tomorrow  3.  Intermittent cough, chest x-ray concerning for pneumonia -Antibiotics as per primary -Gentle diuresing.  4.   Acute on chronic kidney disease -Holding ACE inhibitor -Gentle diuresing, avoid nephrotoxic -Creatinine elevated tomorrow, consider holding torsemide.    Signed, Kate Sable, MD  01/08/2020, 11:47 AM

## 2020-01-08 NOTE — Progress Notes (Signed)
Pharmacy Antibiotic Note  Kristy Solomon is a 86 y.o. female admitted on 12/31/2019 with pneumonia.  Pharmacy has been consulted for Levaquin dosing.  Plan: Due to worsening renal function, will adjust levaquin dose to 500 mg PO Q48H. Will continue to monitor renal function/SCr and adjust dose as necessary. CrCl = 16.8 ml/min   Height: 5\' 4"  (162.6 cm) Weight: 90.4 kg (199 lb 4.8 oz) IBW/kg (Calculated) : 54.7  Temp (24hrs), Avg:98.7 F (37.1 C), Min:97.8 F (36.6 C), Max:99.5 F (37.5 C)  Recent Labs  Lab 01/03/20 0351 01/03/20 0351 01/03/20 1413 01/04/20 0324 01/05/20 0520 01/06/20 0445 01/07/20 0424 01/08/20 0515  WBC 3.8*  --  4.1  --  3.5* 4.6  --  9.0  CREATININE 1.76*   < >  --  1.61* 1.59* 1.55* 1.54* 2.52*   < > = values in this interval not displayed.    Estimated Creatinine Clearance: 16.8 mL/min (A) (by C-G formula based on SCr of 2.52 mg/dL (H)).    Allergies  Allergen Reactions  . Eliquis [Apixaban]     abd pain    Antimicrobials this admission: Levaquin 4/5  >>  Keflex 3/30>>4/2   Dose adjustments this admission:   Microbiology results: COVID swab: negative   Thank you for allowing pharmacy to be a part of this patient's care.  Yanni Quiroa A Dene Landsberg 01/08/2020 8:30 AM

## 2020-01-08 NOTE — Progress Notes (Addendum)
[facility] [place] NAME: Kristy Solomon    MR#:  [id number]  DATE OF BIRTH:  [birth year]  SUBJECTIVE:  patient out in the chair. Slept much better. Coughing up green/brown sputum No respiratory distress. Feels a lot better than yesterday. sats 95% on 2 liters Out in the chair Feels better and wants to go home  REVIEW OF SYSTEMS:   Review of Systems  Constitutional: Negative for chills, fever and weight loss.  HENT: Negative for ear discharge, ear pain and nosebleeds.   Eyes: Negative for blurred vision, pain and discharge.  Respiratory: Positive for cough and sputum production. Negative for wheezing and stridor.   Cardiovascular: Negative for chest pain, palpitations, orthopnea and PND.  Gastrointestinal: Negative for abdominal pain, diarrhea, nausea and vomiting.  Genitourinary: Negative for frequency and urgency.  Musculoskeletal: Negative for back pain and joint pain.  Neurological: Positive for weakness. Negative for sensory change, speech change and focal weakness.  Psychiatric/Behavioral: Negative for depression and hallucinations. The patient is not nervous/anxious.    Tolerating Diet:yes Tolerating PT: HHPT  DRUG ALLERGIES:   Allergies  Allergen Reactions  . Eliquis [Apixaban]     abd pain    VITALS:  Blood pressure (!) 99/49, pulse 92, temperature 98.8 F (37.1 C), temperature source Oral, resp. rate 20, height 5\' 4"  (1.626 m), weight 91.8 kg, SpO2 98 %.  PHYSICAL EXAMINATION:   Physical Exam  GENERAL:  86 y.o.-year-old patient lying in the bed with no acute distress. Obese EYES: Pupils equal, round, reactive to light and accommodation. No scleral icterus.   HEENT: Head atraumatic, normocephalic. Oropharynx and nasopharynx clear.  NECK:  Supple, no jugular venous distention. No thyroid enlargement, no tenderness.  LUNGS:decreased breath sounds bilaterally, no wheezing, rales, rhonchi. No use of accessory muscles of  respiration.  CARDIOVASCULAR: S1, S2 normal. No murmurs, rubs, or gallops. Tachycardia+ ABDOMEN: Soft, nontender, nondistended. Bowel sounds present. No organomegaly or mass.  EXTREMITIES: No cyanosis, clubbing  + edema b/l.    NEUROLOGIC: Cranial nerves II through XII are intact. No focal Motor or sensory deficits b/l.  gen weakness PSYCHIATRIC:  patient is alert and oriented x 3.  SKIN: No obvious rash, lesion, or ulcer.   LABORATORY PANEL:  CBC Recent Labs  Lab 01/08/20 0515  WBC 9.0  HGB 11.8*  HCT 38.6  PLT 69*    Chemistries  Recent Labs  Lab 01/08/20 0515  NA 137  K 4.1  CL 104  CO2 25  GLUCOSE 83  BUN 44*  CREATININE 2.52*  CALCIUM 7.8*  MG 2.2   Cardiac Enzymes No results for input(s): TROPONINI in the last 168 hours. RADIOLOGY:  DG Chest 1 View  Result Date: 01/07/2020 CLINICAL DATA:  Sudden onset of shortness of breath. EXAM: CHEST  1 VIEW COMPARISON:  01/06/2020 FINDINGS: There is a worsening right lower lobe airspace opacity. There is a persistent dense retrocardiac opacity. There is cardiomegaly with prominent interstitial lung markings. There is no pneumothorax. There may be small bilateral pleural effusions. Aortic calcifications are noted. IMPRESSION: 1. Worsening bibasilar airspace opacities concerning for pneumonia. 2. Cardiomegaly with vascular congestion. Electronically Signed   By: Constance Holster M.D.   On: 01/07/2020 22:03   DG Chest Port 1 View  Result Date: 01/06/2020 CLINICAL DATA:  Shortness of breath. EXAM: PORTABLE CHEST 1 VIEW COMPARISON:  01/02/2020 FINDINGS: Persistent enlargement of the cardiac silhouette. Aortic atherosclerosis. Increased atelectasis/infiltrate at the left lung base. The right lung is  clear. Pulmonary vascularity is normal. No acute bone abnormality. IMPRESSION: Increasing atelectasis/infiltrate at the left lung base. Aortic Atherosclerosis (ICD10-I70.0). Electronically Signed   By: Lorriane Shire M.D.   On: 01/06/2020  15:00   ASSESSMENT AND PLAN:  Nekia Maxham Terrellis a 86 y.o.femalewith medical history significant ofhypertension, hyperlipidemia, gout, GERD, lymphoma, CKD-4, thrombocytopenia, CHF, atrial fibrillation not on anticoagulants, who presents with shortness breath and bilateral leg edema. Patient states that she has been having shortness of breath in the past several days, which has been progressively worsening. She has orthopnea.Shortness breath is getting worse with minimal exertion.  Left lower lobe pneumonia -patient complained of increasing shortness of breath through the night. Feels weak. -Coughing up green phlegm -chest x-ray shows left lower lobe infiltrate/atelectasis.  -Given shortness of breath, tachycardia, weakness and productive phlegm will start on IV antibiotics with Levaquin. Pharmacy consultation placed. -remains afebrile  Acute on chronic diastolic CHF echo on 12/05/3612 showed EF of 60-65%, grade II diastolic dysfunction.  -IV Lasix held as creatinine was trending up-- however creatinine stable of 1.5--now 2.5 today -creat upto 2.5--holding torsemide today -Repeat chest x-ray negative for fluid overload however showed left lobe infiltrate.  --wean to RA--assess for home oxygen use -Continue strict ins and outs and daily weights -Continue low-salt diet  acute on chronic persistent a. ERX:[id number] tachycardia on ambulation -pt was not taking anticoagulants at home.  -Heart rate much improved after increasing BB dose -TSH level diet within normal range -[facility] cardio recommended to start Xarelto, patient could not tolerate Eliquis in the past, -cont Toprol 75 mg bid  -cont lotensin  Thrombocytopenia:  This is a known chronic issue however patient had acute worsening thrombocytopenia today plt count 69K  Elevated troponin:most likely due to demand ischemia.   Malignant lymphoma, lymphoplasmacytic:  -continue Ibrutinib.  -Likely the cause of the  leukopenia &thrombocytopenia.  -F/u with oncology outpatient   HLD: continue on statin   Essential hypertension:  Increased metoprolol 100 mg p.o. daily  D/c ed benazepril and hydralazine due to increase in BB and creat up  AKI on CKD stage IV: Creat today 2.5 Hold torsemide -Give IVF  Generalized weakness: seen by PT/OTrecs no PT/OT f/u, plan to discharge home/self-care -PT recommends HHPT  DVT prophylaxis: SCDs Code Status: Full Family Communication:  discussed with daughter [name] [phone] on 01/07/2020 Disposition Plan: Anticipate return to previous home environment 01/09/2020.  PT to re-eval today given change in pt's status Cont IV levaquin for penumonia  -receiving IVF at low rate for elevated creat to 2.5.   TOTAL TIME TAKING CARE OF THIS PATIENT: *30* minutes.  >50% time spent on counselling and coordination of care  Note: This dictation was prepared with Dragon dictation along with smaller phrase technology. Any transcriptional errors that result from this process are unintentional.  Fritzi Mandes M.D    [facility]   CC: Primary care physician; Jodi Marble, MDPatient ID: Kristy Solomon, female   DOB: July 18, 1929, 86 y.o.   MRN: [id number]

## 2020-01-08 NOTE — Progress Notes (Signed)
O2 increased to 6 liters [place] at this time.  O2 sat had dropped to 87%.  RT notified.  Pt now with O2 sat of 90-92%.

## 2020-01-09 DIAGNOSIS — N183 Chronic kidney disease, stage 3 unspecified: Secondary | ICD-10-CM

## 2020-01-09 DIAGNOSIS — N170 Acute kidney failure with tubular necrosis: Secondary | ICD-10-CM

## 2020-01-09 LAB — BASIC METABOLIC PANEL
Anion gap: 9 (ref 5–15)
BUN: 53 mg/dL — ABNORMAL HIGH (ref 8–23)
CO2: 26 mmol/L (ref 22–32)
Calcium: 7.9 mg/dL — ABNORMAL LOW (ref 8.9–10.3)
Chloride: 105 mmol/L (ref 98–111)
Creatinine, Ser: 2.45 mg/dL — ABNORMAL HIGH (ref 0.44–1.00)
GFR calc Af Amer: 20 mL/min — ABNORMAL LOW (ref >60)
GFR calc non Af Amer: 17 mL/min — ABNORMAL LOW (ref >60)
Glucose, Bld: 104 mg/dL — ABNORMAL HIGH (ref 70–99)
Potassium: 4 mmol/L (ref 3.5–5.1)
Sodium: 140 mmol/L (ref 135–145)

## 2020-01-09 MED ORDER — TORSEMIDE 20 MG PO TABS: 20.0000 mg | ORAL_TABLET | Freq: Every day | ORAL | 0 refills | Status: DC

## 2020-01-09 MED ORDER — ALBUTEROL SULFATE HFA 108 (90 BASE) MCG/ACT IN AERS: 2.0000 | INHALATION_SPRAY | Freq: Four times a day (QID) | RESPIRATORY_TRACT | 0 refills | Status: AC | PRN

## 2020-01-09 MED ORDER — LEVOFLOXACIN 500 MG PO TABS: 500.0000 mg | ORAL_TABLET | ORAL | 0 refills | Status: AC

## 2020-01-09 MED ORDER — RIVAROXABAN 15 MG PO TABS: 15.0000 mg | ORAL_TABLET | Freq: Every day | ORAL | 0 refills | Status: AC

## 2020-01-09 MED ORDER — DM-GUAIFENESIN ER 30-600 MG PO TB12: 1.0000 | ORAL_TABLET | Freq: Two times a day (BID) | ORAL | 0 refills | Status: DC

## 2020-01-09 MED ORDER — METOPROLOL SUCCINATE ER 25 MG PO TB24: 75.0000 mg | ORAL_TABLET | Freq: Two times a day (BID) | ORAL | 0 refills | Status: AC

## 2020-01-09 NOTE — Progress Notes (Signed)
Pt O2 sat in the bed is 88-89% on room air. When sitting on 2L, her sats are 94-96%

## 2020-01-09 NOTE — Progress Notes (Signed)
[facility]  [address], [address] [place], [place] [address] Phone: [phone]  Fax: [phone]   Clinic Day:  01/13/2020  Referring physician: Jodi Marble, MD  Chief Complaint: Kristy Solomon is a 86 y.o. female with Waldenstrom's macroglobulinemia/lymphoplasmocytic lymphoma who is seen for a 3 month assessment.  HPI: The patient was last seen in the medical oncology clinic on 10/21/2019. At that time, she denied any complaints. Hematocrit was 44.0, hemoglobin 13.9, platelets 89,000, WBC 3,700. Creatinine 1.68, calcium 8.6, albumin 3.4. Uric acid 7.3. LDH 160. IgM was 1,088 mg/dL. M spike was 0.8 gm/dL. Patient continued ibrutinib.    She received a B12 injection on 11/19/2019 and 12/16/2019.   She was seen in cardiology by Dr. Garen Lah on 11/18/2019. She had lower extremity edema and some exertional shortness of breath. Plan was to increase Toprol-XL based on her heart rate. Follow up was planned for 1 week.   Patient was seen in the ER for intermittent hematuria x 3-4 days on 12/16/2019.She denied any burning or discomfort with urination. She denied any bad odor to her urination. She denied any trauma to her stomach. Urinalysis showed no blood. Urine culture was positive for E.coli.   She was seen by Dr. Hollice Espy on 12/24/2019 for evaluation of hematuria.  Patient reported never being called to start any antibiotics. Her hematuria had resolved. She denied any UTI symptoms. Urinalysis was negative. Patient was prescribed Macrobid 100 mg x 10 days. Follow up was planned for 02/04/2020.   Patient was admitted to [facility] from 12/31/2019 - 01/09/2020. She presented with orthopnea, bilateral leg swelling, and worsening shortness of breath x 7 days. She felt weak and was coughing up green phlegm. She was diagnosed with acute hypoxic respiratory failure secondary to bibasilar pneumonia and CHF.  CXR showed bilateral lower lobe infiltrate/atelectasis.  Patient was started on IV antibiotics with Levaquin. She was changed to oral Levaquin; last dose to be on 01/19/2020.   Echo on 12/31/2019 showed an EF of 50-55%. IV lasix and amlodipine/benazepril were held secondary to rising creatinine. Patient was directed to resume torsemide on 01/11/2020 and continue a low sodium diet. She had atrial fibrillation with RVR.  She had significant tachycardia on ambulation. Her heart rate improved after increasing beta-blocker dose. Cardiology recommended to start Xarelto 15 mg a day (she could not tolerate Eliquis in the past) and continueToprol 75mg  BID.  Platelet count was 65,000.  Creatinine was 2.45.  She continued ibrutinib.   During the interim, she hasfelt "sleepy". She reports getting no sleep at night. She reports having a "bad" heart that led to leg swelling and a hospital admission. Patient has significant lower extremity edema today. She reports the swelling did not improve after discharge on 01/09/2020.   She remains on ibrutinib. She lives alone. She can perform ADLs with no assistance. She can prepare her own meals. She notes losing her appetite after her recent hospital admission. She is on 2 liters/min  of supplemental oxygen. If anything were to happen to her at home, she has 3 phones with her to call people.  She has occasional mild hematuria. She continues to have a cough with sputum production. She denies any fevers or sweats. She denies any lumps or bumps. She has gout in her toes; she would like treatment for her gout. She has not seen her PCP yet since being released from hospital.   She has an upcoming follow up with cardiology. Patient has not considered getting  the COVID-19 vaccine yet.    Past Medical History:  Diagnosis Date  . 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 Hyperlipidemia   . Hypertension   . Malignant lymphoma, lymphoplasmacytic ([place]) 12/27/2013  . Renal insufficiency     Past Surgical History:  Procedure  Laterality Date  . ABDOMINAL HYSTERECTOMY    . HEMORRHOID SURGERY      Family History  Problem Relation Age of Onset  . Cancer Brother        throat ca  . Cancer Brother        bone cancer    Social History:  reports that she has never smoked. She has never used smokeless tobacco. She reports that she does not drink alcohol or use drugs. She lives in [place]. Her emergency contact is Cecille Po, her grandaughter- [phone]). [name]'s phone number [phone]). She lives in [place]. She has transportation issues. She states that her grandson's wife can assist with travel (needs to be arranged). The patient is alone today.  Allergies:  Allergies  Allergen Reactions  . Eliquis [Apixaban]     abd pain    Current Medications: Current Outpatient Medications  Medication Sig Dispense Refill  . albuterol (VENTOLIN HFA) 108 (90 Base) MCG/ACT inhaler Inhale 2 puffs into the lungs every 6 (six) hours as needed for wheezing or shortness of breath. 8 g 0  . allopurinol (ZYLOPRIM) 100 MG tablet Take 2 tablets (200 mg total) by mouth daily. 180 tablet 1  . atorvastatin (LIPITOR) 20 MG tablet Take 20 mg by mouth daily.    Marland Kitchen dextromethorphan-guaiFENesin (MUCINEX DM) 30-600 MG 12hr tablet Take 1 tablet by mouth 2 (two) times daily. 14 tablet 0  . gabapentin (NEURONTIN) 300 MG capsule Take 300 mg by mouth 3 (three) times daily.     . Ibrutinib (IMBRUVICA) 420 MG TABS Take 420 mg by mouth daily. 28 tablet 2  . levofloxacin (LEVAQUIN) 500 MG tablet Take 1 tablet (500 mg total) by mouth every other day for 10 days. 5 tablet 0  . metoprolol succinate (TOPROL-XL) 25 MG 24 hr tablet Take 3 tablets (75 mg total) by mouth 2 (two) times daily. 60 tablet 0  . potassium chloride (MICRO-K) 10 MEQ CR capsule TK 1 C PO QAM    . Rivaroxaban (XARELTO) 15 MG TABS tablet Take 1 tablet (15 mg total) by mouth daily with supper. 42 tablet 0  . torsemide (DEMADEX) 20 MG tablet Take 1 tablet (20 mg total) by  mouth daily. 30 tablet 0   No current facility-administered medications for this visit.   Facility-Administered Medications Ordered in Other Visits  Medication Dose Route Frequency Provider Last Rate Last Admin  . cyanocobalamin ((VITAMIN B-12)) injection 1,000 mcg  1,000 mcg Intramuscular Once Lequita Asal, MD        Review of Systems  Constitutional: Negative for chills, diaphoresis, fever, malaise/fatigue and weight loss (no new weight).       Feels "sleepy". Performing ADLs.  HENT: Negative.  Negative for congestion, ear discharge, ear pain, nosebleeds, sinus pain, sore throat and tinnitus.   Eyes: Negative.  Negative for blurred vision, double vision, photophobia, pain, discharge and redness.  Respiratory: Positive for cough and sputum production. Negative for hemoptysis and shortness of breath.        On 2 liters/min of supplemental oxygen.  Cardiovascular: Positive for leg swelling (chronic BLE). Negative for chest pain, palpitations, orthopnea, claudication and PND.       Atrial fibrillation.  Gastrointestinal: Negative.  Negative for abdominal pain, blood in stool, constipation, diarrhea, heartburn, melena, nausea and vomiting.       No appetite.  Genitourinary: Positive for hematuria (occasional; mild). Negative for dysuria, frequency and urgency.       H/o nephrolithiasis.  LEFT renal mass. H/o hematuria.  Musculoskeletal: Negative.  Negative for back pain, falls, joint pain, myalgias and neck pain.       H/o gout with questionable recent flare.  Skin: Negative.  Negative for itching and rash.       No SQ nodules.  Neurological: Negative.  Negative for dizziness, tingling, tremors, sensory change, speech change, focal weakness, weakness and headaches.  Endo/Heme/Allergies: Negative.  Does not bruise/bleed easily.  Psychiatric/Behavioral: Negative for depression and memory loss. The patient has insomnia. The patient is not nervous/anxious.   All other systems reviewed  and are negative.  Performance status (ECOG): 0  Vitals Blood pressure (!) 159/87, pulse 82, temperature 99.1 F (37.3 C), temperature source Tympanic, resp. rate 18, height 5\' 4"  (1.626 m), SpO2 97 %.   Physical Exam  Constitutional: She is oriented to person, place, and time. She appears well-developed and well-nourished. No distress.  Fatigued appearing woman sitting comfortably in wheelchair with portable oxygen.  HENT:  Head: Normocephalic and atraumatic.  Mouth/Throat: Oropharynx is clear and moist. No oropharyngeal exudate.  Black croquet cap.  Shoulder-length gray hair.  [place] in place.  Mask.  Eyes: Pupils are equal, round, and reactive to light. Conjunctivae and EOM are normal. No scleral icterus.  Glasses.  Brown eyes.  Neck: No JVD present.  Cardiovascular: An irregular rhythm present.  No murmur heard. Pulmonary/Chest: Effort normal and breath sounds normal. No respiratory distress. She has no wheezes. She has no rales. She exhibits no tenderness.  Abdominal: Soft. Bowel sounds are normal. She exhibits no distension and no mass. There is no abdominal tenderness. There is no rebound and no guarding.  Musculoskeletal:        General: Tenderness (L>R) and edema (chronic bilateral lower extremity; R>L) present. Normal range of motion.     Cervical back: Normal range of motion and neck supple.  Lymphadenopathy:       Head (right side): No preauricular, no posterior auricular and no occipital adenopathy present.       Head (left side): No preauricular, no posterior auricular and no occipital adenopathy present.    She has no cervical adenopathy.    She has no axillary adenopathy.       Right: No inguinal and no supraclavicular adenopathy present.       Left: No inguinal and no supraclavicular adenopathy present.  Neurological: She is alert and oriented to person, place, and time.  Skin: Skin is warm and dry. No rash noted. She is not diaphoretic. No erythema. No pallor.  No  palpable SQ masses.  Psychiatric: She has a normal mood and affect. Her behavior is normal. Judgment and thought content normal.  Nursing note and vitals reviewed.   Imaging studies: 03/17/2016:Chest, abdomen, and pelvisCTrevealed progression of diffuse lymphoma with subcutaneous deposits throughout the subcutaneous fat of the chest, abdomen,and pelvis. There were multiple nodular infiltrates throughout the lungs, likely due to lymphoma but could also represent infection or atypical infection. There was massive retroperitoneal lymphadenopathy with diffuse retroperitoneal, mesenteric, and pelvic lymphadenopathy. There was wall thickening and pelvic small bowel probably representing lymphomas involvement. There was probable direct invasion of the left kidney. 04/21/2016:Chest, abdomen, and pelvisCTrevealed a partial response to therapy. There were bilateral pulmonary nodules/masses,  measuring up to 5.6 cm in the right lower lobe (improved). There was trace left pleural effusion. There was a 12.7 cm left renal/perirenal mass (decreased) with associated mild left hydronephrosis. Retroperitoneal/left pelvic lymphadenopathy was mildly decreased. Multifocal subcutaneous nodules, measuring up to 4.5 cm in the left lower anterior abdominal wall were stable to mildly improved. 12/28/2017:Abdomen and pelvisCTwas concerning for small bowel obstruction with transitional zone at the distal ileum. There was no associated mass. LEFT renal massmeasured 6.1 x 5.0 cm (previously 13.7 x 10.7 cm). Suspect combination of mass and adenopathy due to lymphoma in the left adrenal. This area was much less pronounced than on prior study. There was a 1.7 x 1.2 cm lymph node in the RIGHT external iliac node chain. Subcutaneous deposits bilaterally were considerably smaller compared to 2017. There were sizable calculi(2.0 x 1.7 cm, 2.6 x 2.0 cm) in each kidney without obstruction. 07/11/2018:Chest,  abdomen, and pelvisCTrevealed a hypodense 3.6 cm inferior LEFT thyroid nodule (stable), new subpleural medial 0.8 cm apical RUL nodule, new subpleural anterior 0.5 cm LUL nodule, basilar LLL mass measuring 4.6 x 3.3 cm (previously 4.2 x 3.2 cm) and a basilar RLL nodule measuring 1.4 cm (previously 0.6 cm). The LEFT upper breast mass measuring 2.4 x 2.0 cm (previously 3.8 x 2.1 cm). There were superficial RIGHT chest wall masses measuring up to 2.3 x 0.9 cm (previously 4.1 x 1.6 cm). There were several bulky stones in the RIGHT renal pelvis measuring up to 22 x 11 mm, and a non-obstructing 13 mm LEFT renal stone. There was a coarsely calcified 2.3 cm anterior interpolar RIGHT renal cortical lesion, and a simple 1.8 cm anterior RIGHT renal cyst. The infiltrative soft tissue density, replacing the LEFT renal sinus and partially encasing the kidney/vascular pedicle, spanned up to 10.2 x 6.5 cm. 07/12/2019: Chest abdomen and pelvis CTrevealed stable left breast lesion. The other subcutaneous lesions involving the right chest wall haddecreased in size since the prior examination. There were no new or progressive findings.There was improved CT appearance of the lungs.There was persistent patchy airspace opacities and scarring type changes but no discrete mass or nodule.Here was no mediastinal or hilar adenopathy.There was stable soft tissue density infiltrating the left kidney and left renal sinus and vascular pedicle region.There was stable bilateral renal calculi.There was no abdominal/pelvic lymphadenopathy or inguinal adenopathy.There was stable severe atherosclerotic disease.   Appointment on 01/13/2020  Component Date Value Ref Range Status  . Uric Acid, Serum 01/13/2020 7.6* 2.5 - 7.1 mg/dL Final   Performed at [facility], [address], [place], [place] [address]  . LDH 01/13/2020 270* 98 - 192 U/L Final   Performed at [facility], [address]  [place], [place] [address]  . Sodium 01/13/2020 142  135 - 145 mmol/L Final  . Potassium 01/13/2020 3.8  3.5 - 5.1 mmol/L Final  . Chloride 01/13/2020 108  98 - 111 mmol/L Final  . CO2 01/13/2020 22  22 - 32 mmol/L Final  . Glucose, Bld 01/13/2020 110* 70 - 99 mg/dL Final   Glucose reference range applies only to samples taken after fasting for at least 8 hours.  . BUN 01/13/2020 30* 8 - 23 mg/dL Final  . Creatinine, Ser 01/13/2020 1.40* 0.44 - 1.00 mg/dL Final  . Calcium 01/13/2020 8.1* 8.9 - 10.3 mg/dL Final  . Total Protein 01/13/2020 6.9  6.5 - 8.1 g/dL Final  . Albumin 01/13/2020 3.1* 3.5 - 5.0 g/dL Final  . AST 01/13/2020 22  15 - 41 U/L Final  . ALT 01/13/2020 17  0 - 44 U/L Final  . Alkaline Phosphatase 01/13/2020 44  38 - 126 U/L Final  . Total Bilirubin 01/13/2020 1.0  0.3 - 1.2 mg/dL Final  . GFR calc non Af Amer 01/13/2020 33* >60 mL/min Final  . GFR calc Af Amer 01/13/2020 39* >60 mL/min Final  . Anion gap 01/13/2020 12  5 - 15 Final   Performed at [facility], [address], [address] [place], [place] [address]  . WBC 01/13/2020 4.2  4.0 - 10.5 K/uL Final  . RBC 01/13/2020 5.23* 3.87 - 5.11 MIL/uL Final  . Hemoglobin 01/13/2020 13.4  12.0 - 15.0 g/dL Final  . HCT 01/13/2020 42.8  36.0 - 46.0 % Final  . MCV 01/13/2020 81.8  80.0 - 100.0 fL Final  . MCH 01/13/2020 25.6* 26.0 - 34.0 pg Final  . MCHC 01/13/2020 31.3  30.0 - 36.0 g/dL Final  . RDW 01/13/2020 16.4* 11.5 - 15.5 % Final  . Platelets 01/13/2020 86* 150 - 400 K/uL Final   Comment: Immature Platelet Fraction may be clinically indicated, consider ordering this additional test [id number]   . nRBC 01/13/2020 0.0  0.0 - 0.2 % Final  . Neutrophils Relative % 01/13/2020 68  % Final  . Neutro Abs 01/13/2020 2.9  1.7 - 7.7 K/uL Final  . Lymphocytes Relative 01/13/2020 14  % Final  . Lymphs Abs 01/13/2020 0.6* 0.7 - 4.0 K/uL Final  . Monocytes Relative 01/13/2020 13  % Final  . Monocytes Absolute 01/13/2020 0.5  0.1 - 1.0  K/uL Final  . Eosinophils Relative 01/13/2020 0  % Final  . Eosinophils Absolute 01/13/2020 0.0  0.0 - 0.5 K/uL Final  . Basophils Relative 01/13/2020 1  % Final  . Basophils Absolute 01/13/2020 0.0  0.0 - 0.1 K/uL Final  . Immature Granulocytes 01/13/2020 4  % Final  . Abs Immature Granulocytes 01/13/2020 0.15* 0.00 - 0.07 K/uL Final   Performed at [facility], [address] [place],  [address]    Assessment:  Kristy Solomon is a 86 y.o. 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 She presented with massive subcutaneous deposits, pulmonary nodules, involvement of left kidney, and bowel. Disease was unresponsive toRituxanin 2014.   Chest, abdomen, and pelvisCTon 03/17/2016 revealed progression of diffuse lymphoma with subcutaneous deposits throughout the subcutaneous fat of the chest, abdomen,and pelvis. There were multiple nodular infiltrates throughout the lungs, likely due to lymphoma but could also represent infection or atypical infection. There was massive retroperitoneal lymphadenopathy with diffuse retroperitoneal, mesenteric, and pelvic lymphadenopathy. There was wall thickening and pelvic small bowel probably representing lymphomas involvement. There was probable direct invasion of the left kidney.  Biopsyof the dominantSQ nodal conglomerationaround the right flank on 03/18/2016 revealed a persistent low-grade CD5 positive B-cell lymphoma. Flow cytometry was positive for CD20, CD79a, CD5, CD138. Neoplastic cells were positive for kappa and cyclin D1. CD 23 was negative. Additional molecular markers are pending.  Bone marrowaspirate and biopsy on 03/18/2016 revealed no evidence of lymphoma or plasma cell neoplasm (0.2% kappa monoclonal plasma cells by flow analysis). Marrow was hypercellular for age (50-60%) with trilineage hematopoiesis and no increased blasts. There was mild to moderate widespread  increase in reticulin fibers. There was no storage iron detected.  The following studies were abnormal: Beta2-microglobulin was 9.6 (0.6-2.4). Serum viscosity was 5.5 (1.6-1.9). Uric acid was 11.1 on 03/19/2016 and 5.7 on 05/23/2016.   Normalstudies included: hepatitis B  surface antigen, hepatitis B core antibody total, hepatitis C antibody, HIV testing, and LDH (123). G6PD assay was 9.2 (4.6-13.5).   She has iron deficiency(ferritin 27; iron saturation 11%),B12 deficiency (147), and folate deficiency(5.9). She has chronic renal insufficiency (BUN was 30-35, creatinine was 1.38 - 1.80 with a GFR 29-39 ml/min). She began B12 injectionson 03/28/2016 (last 01/30/2019). She is on folic acid. Folate was 11.2 on 01/13/2020.  She received 1 week of chlorambucilwith a tapering dose of prednisone(began 03/30/2016). Cortisol was 9.5 on 05/17/2016. She began ibrutinib on 04/26/2016. She has mild thrombocytopenia.   SPEPhas been followed: 5.0 gm/dL on 03/18/2016, 4.5 on 04/25/2016, 2.3 on 06/20/2016, 2.0 on 10/24/2016, 1.5 on 12/19/2016, 1.4 on 03/13/2017, 1.1 on 07/01/2017, 1.3 on 01/08/2018, 0.9 on 04/12/2018, 1.0 on 07/06/2018, 0.8 on 10/21/2019, and 0.6 on 01/13/2020.  IgM has been followed: >5850 on 03/18/2016, >5850 on 04/25/2016, 4652 on 06/09/2016, 3719 on 07/18/2016, 3429 on 10/24/2016, 2821 on 12/19/2016, 2295 on 03/13/2017, 2500 on 06/30/2017, 1890 09/29/2017, 1769 on 01/08/2018, 1244 on 04/12/2018, 1509 on 07/06/2018, 1567 on 09/11/2018, 1281 on 01/16/2019, 1094 on 05/01/2019, 1088 on 10/21/2019, and 950 on 01/13/2020.  Serum viscosityhas been followed: 5.5 on 03/18/2016 and 1.6 on 09/11/2018.  Chest abdomen and pelvis CTon 10/09/2020revealed stable left breast lesion. The other subcutaneous lesions involving the right chest wall haddecreased in size since the prior examination. There were no new or progressive findings.There was improved CT appearance of the  lungs.There was persistent patchy airspace opacities and scarring type changes but no discrete mass or nodule.Here was no mediastinal or hilar adenopathy.There was stable soft tissue density infiltrating the left kidney and left renal sinus and vascular pedicle region.There was stable bilateral renal calculi.There was no abdominal/pelvic lymphadenopathy or inguinal adenopathy.There was stable severe atherosclerotic disease.  She has atrial fibrillation.  CHA2DS2-VASc score is 43 (age, HTN, gender).She is on Xarelto 15 mg a day.  Symptomatically, she is fatigued.  She has struggled with lower extremity edema.  She denies any fevers or sweats.  Exam reveals no adenopathy or SQ nodularity.  Plan: 1.   Labs today: CBC with diff, CMP, SPEP, IgM, LDH, uric acid, ferritin, iron studies, folate.  2. Waldenstrm's macroglobulinemia Clinically, she appears to be doing well on ibrutinib. Exam reveals no adenopathy or SQ nodularity. Chest, abdomen,pelvic CT scan on 07/12/2019 revealed the left breast lesion as well as the SQlesions in the right chest wall had decreased.  LDH has increased from 160 to 270. IgM is 950 and M-spike is 0.8 gm/dL (improved).  Discuss restaging CT scans without contrast secondary to renal function.  Continue ibrutinib. 3.Thrombocytopenia Hematocrit 40.3, hemoglobin 12.6, platelets 91,000,WBC4800 ([place] 2600) on 05/01/2019. Hematocrit 42.8, hemoglobin 12.9, platelets 87,000,WBC4700 ([place] 2500)on 07/22/2019.       Hematocrit 44.0, hemoglobin 13.9, platelets 89,000,WBC3700 ([facility] 2400)on 10/21/2019.  Hematocrit 42.8, hemoglobin 13.4, platelets 86,000,WBC 4200 ([place] 2900)on 01/13/2020.  Overall, platelet count is stable.  Continue to monitor. 4. LEFT renal mass Imaging studies in 07/12/2019 revealed a stable soft tissue density infiltrating the left kidney and left renal sinus as well as vascular pedicle  region. Mass encasing the left kidneyis c/wWaldenstrm's. Interval hematuria without UTI symptoms (unclear if related to mass). Discuss imaging. 5.Renal insufficiency Creatinine1.40 today (improved).  Creatinine 1.31-1.74 last 4 months. She has known left renal involvement with Waldenstrom's as well as nephrolithiasis. Patient is followed by Dr Juleen China. Avoid IV contrast secondary to renal function. Recent increase in diuretics.  Continue to monitor. 6.Atrial fibrillation Patient switched from Eliquis  to Xarelto per cardiology.             Monitor closely secondary to renal function. 7. B12 deficiency LastB12 on03/15/2021. B12 today and monthly x 6. 8. Folate deficiency Folate 11.2 on 01/13/2020. Continue to monitor. 9.   Schedule chest, abdomen, and pelvis CT on 04/19/202. 10.   RTC after imaging for MD assessment, review of imaging, and discussion regarding direction of therapy.   I discussed the assessment and treatment plan with the patient.  The patient was provided an opportunity to ask questions and all were answered.  The patient agreed with the plan and demonstrated an understanding of the instructions.  The patient was advised to call back if the symptoms worsen or if the condition fails to improve as anticipated.   Lequita Asal, MD, PhD    01/13/2020, 2:03 PM  I, Selena Batten, am acting as scribe for [employer]. Mike Gip, MD, PhD.  I, Linh Johannes C. Mike Gip, MD, have reviewed the above documentation for accuracy and completeness, and I agree with the above.

## 2020-01-09 NOTE — Progress Notes (Addendum)
Pt o2 sats on room air while in bed at rest was 86%. Pt walking 30 feet in the hallway, her sats on room air was 82%. Pt walked back to bed, 2LNC was placed on pt and sats came up to 91%.

## 2020-01-09 NOTE — Care Management Important Message (Signed)
Important Message  Patient Details  Name: Kristy Solomon MRN: [id number] Date of Birth: [birth year]   Medicare Important Message Given:  Yes     [name] 01/09/2020, 1:59 PM

## 2020-01-09 NOTE — Discharge Summary (Signed)
[facility] NAME: Kristy Solomon    MR#:  [id number]  DATE OF BIRTH:  01/08/1929  DATE OF ADMISSION:  12/31/2019 ADMITTING PHYSICIAN: Ivor Costa, MD  DATE OF DISCHARGE: 01/09/2020  PRIMARY CARE PHYSICIAN: Jodi Marble, MD    ADMISSION DIAGNOSIS:  Peripheral edema [R60.9] Acute on chronic diastolic (congestive) heart failure (HCC) [I50.33] Acute on chronic congestive heart failure, unspecified heart failure type ([place]) [I50.9] Acute decompensated heart failure (HCC) [I50.9] Acute on chronic diastolic heart failure ([place]) [I50.33]  DISCHARGE DIAGNOSIS:  acute on chronic diastolic congestive heart failure paroxysmal atrial fibrillation-- now on Xarelto pneumonia by basilar acute hypoxic respiratory failure secondary to congestive heart failure and pneumonia acute on chronic kidney disease stage IIIb  SECONDARY DIAGNOSIS:   Past Medical History:  Diagnosis Date  . 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 Hyperlipidemia   . Hypertension   . 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 Renal insufficiency     HOSPITAL COURSE:  Kristy Solomon a 86 y.o.femalewith medical history significant ofhypertension, hyperlipidemia, gout, GERD, lymphoma, CKD-4, thrombocytopenia, CHF, atrial fibrillation not on anticoagulants, who presents with shortness breath and bilateral leg edema. Patient states that she has been having shortness of breath in the past several days, which has been progressively worsening. She has orthopnea.Shortness breath is getting worse with minimal exertion.  Acute hypoxic respiratory failure secondary to bibasilar lower lobe pneumonia and CHF -patient complained of increasing shortness of breath through the night. Feels weak. -Coughing up green phlegm -chest x-ray shows bilateral lower lobe infiltrate/atelectasis.  -Given shortness of breath, tachycardia, weakness and productive phlegm  will start on IV antibiotics with Levaquin-- change to oral Levaquin last dose will be April 18 th  -pharmacy consultation appreciated -remains afebrile -PRN cough medicine -oral inhalers PRN  Acute on chronic diastolic CHF echo on 10/11/4172 showed EF of 60-65%, grade II diastolic dysfunction.  -IV Lasix held as creatinine was trending up-- however creatinine stable of 1.5--now 2.5-- 2.4 -urine output 1400 mL 24 hours -should will resume torsemide from 4/10/ 21 -assess for home oxygen use -Continue strict ins and outs and daily weights -Continue low-salt diet -holding amlodipine/benazepril and hydralazine due to soft blood pressure and rising creatinine-- discussed with Dr. Rockey Situ  acute on chronic persistent a. [id number] tachycardia on ambulation -pt was not taking anticoagulants at home.  -Heart rate much improved after increasing BB dose -TSH leveldiet within normal range -[facility] cardio recommended to start Xarelto, patient could not tolerate Eliquis in the past, -cont Toprol 75 mg bid   Thrombocytopenia:  This is a known chronic issue however patient had acute worsening thrombocytopenia today plt count 69K  Elevated troponin:most likely due to demand ischemia.   Malignant lymphoma, lymphoplasmacytic:  -continue Ibrutinib.  -Likely the cause of the leukopenia &thrombocytopenia.  -F/u with oncology outpatient   HLD: continue on statin   Essential hypertension:  on metoprolol 75 mg BID D/c ed  amlodipine/benazepril and hydralazine due to increase in BB and creat up  AKI on CKD stage IV: Creat today 2.5-- 2,4 -resume  torsemide from April 10 -received IVF -will follow-up with Dr. Juleen China nephrology as outpatient. He is aware.  Generalized weakness: -PT recommends HHPT  DVT prophylaxis: to Code Status:Full Family Communication: discussed with daughter Kristy Solomon [phone] on 01/07/2020 Disposition Plan:Anticipate return to previous home  environment 01/09/2020.  Discharge to home with home health today.  Assessment for home oxygen needs to be done by RN today.  Order placed.    [name] aware of discharge plan. Spoke with her this morning.    CONSULTS OBTAINED:  Treatment Team:  Minna Merritts, MD Wellington Hampshire, MD  DRUG ALLERGIES:   Allergies  Allergen Reactions  . Eliquis [Apixaban]     abd pain    DISCHARGE MEDICATIONS:   Allergies as of 01/09/2020      Reactions   Eliquis [apixaban]    abd pain      Medication List    STOP taking these medications   amLODipine-benazepril 5-20 MG capsule Commonly known as: LOTREL   hydrALAZINE 50 MG tablet Commonly known as: APRESOLINE   nitrofurantoin (macrocrystal-monohydrate) 100 MG capsule Commonly known as: MACROBID     TAKE these medications   albuterol 108 (90 Base) MCG/ACT inhaler Commonly known as: VENTOLIN HFA Inhale 2 puffs into the lungs every 6 (six) hours as needed for wheezing or shortness of breath.   allopurinol 100 MG tablet Commonly known as: Zyloprim Take 2 tablets (200 mg total) by mouth daily.   atorvastatin 20 MG tablet Commonly known as: LIPITOR Take 20 mg by mouth daily.   dextromethorphan-guaiFENesin 30-600 MG 12hr tablet Commonly known as: MUCINEX DM Take 1 tablet by mouth 2 (two) times daily.   gabapentin 300 MG capsule Commonly known as: NEURONTIN Take 300 mg by mouth 3 (three) times daily.   Imbruvica 420 MG Tabs Generic drug: Ibrutinib Take 420 mg by mouth daily.   levofloxacin 500 MG tablet Commonly known as: Levaquin Take 1 tablet (500 mg total) by mouth every other day for 10 days.   metoprolol succinate 25 MG 24 hr tablet Commonly known as: TOPROL-XL Take 3 tablets (75 mg total) by mouth 2 (two) times daily. What changed:   medication strength  how much to take  when to take this   potassium chloride 10 MEQ CR capsule Commonly known as: MICRO-K TK 1 C PO QAM   Rivaroxaban 15 MG Tabs  tablet Commonly known as: XARELTO Take 1 tablet (15 mg total) by mouth daily with supper.   torsemide 20 MG tablet Commonly known as: Demadex Take 1 tablet (20 mg total) by mouth daily. Start taking on: January 11, 2020 What changed:   how much to take  These instructions start on January 11, 2020. If you are unsure what to do until then, ask your doctor or other care provider.       If you experience worsening of your admission symptoms, develop shortness of breath, life threatening emergency, suicidal or homicidal thoughts you must seek medical attention immediately by calling 911 or calling your MD immediately  if symptoms less severe.  You Must read complete instructions/literature along with all the possible adverse reactions/side effects for all the Medicines you take and that have been prescribed to you. Take any new Medicines after you have completely understood and accept all the possible adverse reactions/side effects.   Please note  You were cared for by a hospitalist during your hospital stay. If you have any questions about your discharge medications or the care you received while you were in the hospital after you are discharged, you can call the unit and asked to speak with the hospitalist on call if the hospitalist that took care of you is not available. Once you are discharged, your primary care physician will handle any further medical issues. Please note that NO REFILLS for any discharge medications will be authorized once you are discharged, as it is imperative that  you return to your primary care physician (or establish a relationship with a primary care physician if you do not have one) for your aftercare needs so that they can reassess your need for medications and monitor your lab values. Today   SUBJECTIVE   Feel okay. Coughing up phlegm. No respiratory distress. No fever  VITAL SIGNS:  Blood pressure 112/66, pulse 91, temperature 99.4 F (37.4 C), temperature  source Oral, resp. rate 18, height 5\' 4"  (1.626 m), weight 91.1 kg, SpO2 98 %.  I/O:    Intake/Output Summary (Last 24 hours) at 01/09/2020 0906 Last data filed at 01/09/2020 0347 Gross per 24 hour  Intake 840 ml  Output 1550 ml  Net -710 ml    PHYSICAL EXAMINATION:  GENERAL:  86 y.o.-year-old patient lying in the bed with no acute distress.  EYES: Pupils equal, round, reactive to light and accommodation. No scleral icterus.  HEENT: Head atraumatic, normocephalic. Oropharynx and nasopharynx clear.  NECK:  Supple, no jugular venous distention. No thyroid enlargement, no tenderness.  LUNGS: decreased breath sounds bilaterally basically, no wheezing, rales, scattered rhonchi or crepitation. No use of accessory muscles of respiration.  CARDIOVASCULAR: S1, S2 normal. No murmurs, rubs, or gallops.  ABDOMEN: Soft, non-tender, non-distended. Bowel sounds present. No organomegaly or mass.  EXTREMITIES: one plus pedal edema, no cyanosis, or clubbing.  NEUROLOGIC: Cranial nerves II through XII are intact. Muscle strength 5/5 in all extremities. Sensation intact. Gait not checked.  PSYCHIATRIC: The patient is alert and oriented x 3.  SKIN: No obvious rash, lesion, or ulcer.   DATA REVIEW:   CBC  Recent Labs  Lab 01/08/20 0515  WBC 9.0  HGB 11.8*  HCT 38.6  PLT 69*    Chemistries  Recent Labs  Lab 01/08/20 0515 01/08/20 0515 01/09/20 0449  NA 137   < > 140  K 4.1   < > 4.0  CL 104   < > 105  CO2 25   < > 26  GLUCOSE 83   < > 104*  BUN 44*   < > 53*  CREATININE 2.52*   < > 2.45*  CALCIUM 7.8*   < > 7.9*  MG 2.2  --   --    < > = values in this interval not displayed.    Microbiology Results   Recent Results (from the past 240 hour(s))  SARS CORONAVIRUS 2 (TAT 6-24 HRS) Nasopharyngeal Nasopharyngeal Swab     Status: None   Collection Time: 12/31/19  9:47 AM   Specimen: Nasopharyngeal Swab  Result Value Ref Range Status   SARS Coronavirus 2 NEGATIVE NEGATIVE Final     Comment: (NOTE) SARS-CoV-2 target nucleic acids are NOT DETECTED. The SARS-CoV-2 RNA is generally detectable in upper and lower respiratory specimens during the acute phase of infection. Negative results do not preclude SARS-CoV-2 infection, do not rule out co-infections with other pathogens, and should not be used as the sole basis for treatment or other patient management decisions. Negative results must be combined with clinical observations, patient history, and epidemiological information. The expected result is Negative. Fact Sheet for Patients: SugarRoll.be Fact Sheet for Healthcare Providers: https://www.woods-mathews.com/ This test is not yet approved or cleared by the [place] FDA and  has been authorized for detection and/or diagnosis of SARS-CoV-2 by FDA under an Emergency Use Authorization (EUA). This EUA will remain  in effect (meaning this test can be used) for the duration of the COVID-19 declaration under Section 56 4(b)(1) of the Act,  21 U.S.C. section 360bbb-3(b)(1), unless the authorization is terminated or revoked sooner. Performed at [facility], [place] [address] [place], [place] [address]     RADIOLOGY:  DG Chest 1 View  Result Date: 01/07/2020 CLINICAL DATA:  Sudden onset of shortness of breath. EXAM: CHEST  1 VIEW COMPARISON:  01/06/2020 FINDINGS: There is a worsening right lower lobe airspace opacity. There is a persistent dense retrocardiac opacity. There is cardiomegaly with prominent interstitial lung markings. There is no pneumothorax. There may be small bilateral pleural effusions. Aortic calcifications are noted. IMPRESSION: 1. Worsening bibasilar airspace opacities concerning for pneumonia. 2. Cardiomegaly with vascular congestion. Electronically Signed   By: Constance Holster M.D.   On: 01/07/2020 22:03     CODE STATUS:     Code Status Orders  (From admission, onward)         Start      Ordered   12/31/19 1057  Full code  Continuous     12/31/19 1057        Code Status History    Date Active Date Inactive Code Status Order ID Comments User Context   09/18/2018 0223 09/19/2018 2212 Full Code [id number]  Lance Coon, MD Inpatient   12/28/2017 2226 12/29/2017 2048 Full Code [id number]  Demetrios Loll, MD Inpatient   03/17/2016 0920 03/20/2016 1637 Full Code [id number]  Demetrios Loll, MD Inpatient   Advance Care Planning Activity       TOTAL TIME TAKING CARE OF THIS PATIENT: *40* minutes.    Fritzi Mandes M.D  [facility]    CC: Primary care physician; Jodi Marble, MD

## 2020-01-09 NOTE — Progress Notes (Signed)
Progress Note  Patient Name: Kristy Solomon Date of Encounter: 01/09/2020  Primary Cardiologist:  new to [facility]  Subjective   Reports that she feels well, has a cough, productive Currently on 2 L oxygen but reports that she is stable without the oxygen Reports that she is walked out of bed Further discussion concerning her home situation, lives alone, daughter helps her with food She can drive if she has to  Denies fever, appetite good  Discussed with Dr. Posey Pronto, all of her blood pressure medications held, metoprolol increased 75 twice daily  Inpatient Medications    Scheduled Meds:  allopurinol  200 mg Oral Daily   atorvastatin  20 mg Oral Daily   dextromethorphan-guaiFENesin  1 tablet Oral BID   docusate sodium  200 mg Oral BID   gabapentin  200 mg Oral TID   guaiFENesin  600 mg Oral BID   Ibrutinib  420 mg Oral Daily   mouth rinse  15 mL Mouth Rinse BID   metoprolol succinate  75 mg Oral BID   polyethylene glycol  17 g Oral Daily   rivaroxaban  15 mg Oral Q supper   sodium chloride flush  3 mL Intravenous Q12H   Continuous Infusions:  sodium chloride Stopped (01/06/20 1954)   levofloxacin (LEVAQUIN) IV Stopped (01/08/20 1738)   PRN Meds: sodium chloride, acetaminophen, albuterol, ipratropium, menthol-cetylpyridinium, ondansetron (ZOFRAN) IV, sodium chloride flush   Vital Signs    Vitals:   01/09/20 0342 01/09/20 0400 01/09/20 0745 01/09/20 1056  BP: 125/79  112/66 109/85  Pulse: 79  91 (!) 101  Resp:  (!) 23 18 19   Temp: 99.6 F (37.6 C)  99.4 F (37.4 C) 99.1 F (37.3 C)  TempSrc: Oral  Oral Oral  SpO2: 100% 95% 98% 99%  Weight: 91.1 kg     Height:        Intake/Output Summary (Last 24 hours) at 01/09/2020 1107 Last data filed at 01/09/2020 0936 Gross per 24 hour  Intake 360 ml  Output 1200 ml  Net -840 ml   Last 3 Weights 01/09/2020 01/08/2020 01/07/2020  Weight (lbs) 200 lb 14.4 oz 202 lb 4.8 oz 199 lb 4.8 oz  Weight (kg) 91.128 kg  91.763 kg 90.402 kg      Telemetry    Atrial fib -heart rate 90-100 personally Reviewed  ECG    - Personally Reviewed  Physical Exam   GEN: No acute distress.   Neck:  Unable to estimate JVD Cardiac:  Irregularly irregular, no murmurs, rubs, or gallops.  Respiratory:  Rales at the bases GI: Soft, nontender, non-distended  MS: No edema; No deformity. Neuro:  Nonfocal  Psych: Normal affect   Labs    High Sensitivity Troponin:   Recent Labs  Lab 12/31/19 0718 12/31/19 0853 12/31/19 1110 12/31/19 1350 12/31/19 1637  TROPONINIHS 43* 50* 59* 51* 49*      Chemistry Recent Labs  Lab 01/07/20 0424 01/08/20 0515 01/09/20 0449  NA 138 137 140  K 4.2 4.1 4.0  CL 104 104 105  CO2 24 25 26   GLUCOSE 113* 83 104*  BUN 26* 44* 53*  CREATININE 1.54* 2.52* 2.45*  CALCIUM 8.4* 7.8* 7.9*  GFRNONAA 30* 16* 17*  GFRAA 35* 19* 20*  ANIONGAP 10 8 9      Hematology Recent Labs  Lab 01/05/20 0520 01/06/20 0445 01/08/20 0515  WBC 3.5* 4.6 9.0  RBC 5.00 5.41* 4.52  HGB 13.0 14.0 11.8*  HCT 42.7 45.4 38.6  MCV  85.4 83.9 85.4  MCH 26.0 25.9* 26.1  MCHC 30.4 30.8 30.6  RDW 16.1* 16.6* 16.3*  PLT 77* 79* 69*    BNPNo results for input(s): BNP, PROBNP in the last 168 hours.   DDimer No results for input(s): DDIMER in the last 168 hours.   Radiology    DG Chest 1 View  Result Date: 01/07/2020 CLINICAL DATA:  Sudden onset of shortness of breath. EXAM: CHEST  1 VIEW COMPARISON:  01/06/2020 FINDINGS: There is a worsening right lower lobe airspace opacity. There is a persistent dense retrocardiac opacity. There is cardiomegaly with prominent interstitial lung markings. There is no pneumothorax. There may be small bilateral pleural effusions. Aortic calcifications are noted. IMPRESSION: 1. Worsening bibasilar airspace opacities concerning for pneumonia. 2. Cardiomegaly with vascular congestion. Electronically Signed   By: Constance Holster M.D.   On: 01/07/2020 22:03     Cardiac Studies   Echo 1. Left ventricular ejection fraction, by estimation, is 50 to 55%. The  left ventricle has low normal function. The left ventricle has no regional  wall motion abnormalities. There is mild left ventricular hypertrophy.  Left ventricular diastolic  parameters are indeterminate.  2. Right ventricular systolic function is normal. The right ventricular  size is normal. Tricuspid regurgitation signal is inadequate for assessing  PA pressure.  3. Left atrial size was mildly dilated.  4. Right atrial size was mildly dilated.  5. The mitral valve is abnormal. Mild mitral valve regurgitation. No  evidence of mitral stenosis.  6. Tricuspid valve regurgitation is mild to moderate.  7. The aortic valve is tricuspid. Aortic valve regurgitation is not  visualized. No aortic stenosis is present.  8. The inferior vena cava is dilated in size with <50% respiratory  variability, suggesting right atrial pressure of 15 mmHg  Patient Profile     Kristy Solomon is a 86 y.o. female with a hx of  Hypertension, hyperlipidemia, renal failure, CHF, malignant lymphoma, hematuria  Persistent atrial fibrillation, CHA2DS2-VASc score is 5  presenting to the hospital with shortness of breath, leg edema, atrial fibrillation with RVR , Pneumonia  Assessment & Plan    1. Atrial fibrillation with RVR Persistent atrial fibrillation since January 2021 Since then with weight gain, leg edema, diastolic heart failure symptoms  unable to tolerate Eliquis, took 1 dose and it caused some stomach trouble  -Transition this admission to Xarelto 15 mg daily  --Creatinine clearance estimated 36 Aspirin held ---Toprol has been titrated upwards, her other blood pressure medications held --Currently appears to be tolerating metoprolol succinate 75 twice daily --Acute on chronic renal dysfunction limiting use of digoxin --- Would plan for attempt to restore normal sinus rhythm in 1 month's  time given leg edema, heart failure symptoms, difficulty managing as an outpatient even with torsemide.  Stressed importance of compliance with anticoagulation  2.  Severe aortic atherosclerosis  seen on CT scan Outpatient management, cholesterol is at goal  Chronic diastolic CHF Recent exacerbation in the setting of atrial fibrillation since January 2021, persistent since that time Would likely do better in normal sinus rhythm Likely moderate overdiuresis this admission with climbing creatinine up to 2.45 --- Would likely wait a week before restarting torsemide Was previously taking torsemide 40 mg daily  Malignant lymphoma, lymphoplasmacytic Followed by oncology Platelets 69 , stable 11.8 hemoglobin  Acute on chronic renal failure Creatinine 1.59, on arrival, Worse with aggressive diuresis Diuretics on hold, wait 1 week before restarting torsemide  Case discussed with hospitalist service,  discharge plans arranged Discussed with patient concerning discharge planning Currently on 2 L nasal cannula, looks weak We will need to arrange home PT Confirm no hypoxia with ambulation off oxygen  Total encounter time more than 45 minutes  Greater than 50% was spent in counseling and coordination of care with the patient   For questions or updates, please contact [place] Please consult [email or web address] for contact info under        Signed, Ida Rogue, MD  01/09/2020, 11:07 AM

## 2020-01-09 NOTE — TOC Transition Note (Signed)
Transition of Care [facility]) - CM/SW Discharge Note   Patient Details  Name: Kristy Solomon MRN: [id number] Date of Birth: [birth year]  [facility] CM/SW Contact:  Eileen Stanford, LCSW Phone Number: 01/09/2020, 10:50 AM   Clinical Narrative:   HH is arranged, and 02 will be delivered at bedside prior to d/c. Daughter will transport pt home. NO additional needs at this time.    Final next level of care: Home w Home Health Services Barriers to Discharge: No Barriers Identified   Patient Goals and CMS Choice Patient states their goals for this hospitalization and ongoing recovery are:: to go home      Discharge Placement                Patient to be transferred to facility by: Daughter will pick pt up   Patient and family notified of of transfer: 01/09/20  Discharge Plan and Services In-house Referral: Clinical Social Work Discharge Planning Services: CM Consult Post Acute Care Choice: Home Health          DME Arranged: Oxygen DME Agency: [employer] Date DME Agency Contacted: 01/09/20 Time DME Agency Contacted: 62 Representative spoke with at DME Agency: [place]: RN [place] Agency: [facility] [employer] Date [place]: 01/08/20 Time [place]: 1049 Representative spoke with at [place]: [place] ([place]) Interventions     Readmission Risk Interventions No flowsheet data found.

## 2020-01-10 ENCOUNTER — Ambulatory Visit: Payer: Medicare HMO | Admitting: Family

## 2020-01-10 ENCOUNTER — Telehealth: Payer: Self-pay | Admitting: Family

## 2020-01-10 ENCOUNTER — Encounter: Payer: Self-pay | Admitting: Hematology and Oncology

## 2020-01-10 ENCOUNTER — Other Ambulatory Visit: Payer: Self-pay

## 2020-01-10 NOTE — Telephone Encounter (Signed)
Patient did not show for her Heart Failure Clinic appointment on 01/10/20. Will attempt to reschedule.

## 2020-01-10 NOTE — Progress Notes (Signed)
RN called for pre assessment for appt on Monday April 12.  Pt DOB and name verified.

## 2020-01-13 ENCOUNTER — Inpatient Hospital Stay: Payer: Medicare HMO | Attending: Hematology and Oncology | Admitting: Hematology and Oncology

## 2020-01-13 ENCOUNTER — Encounter: Payer: Self-pay | Admitting: Hematology and Oncology

## 2020-01-13 ENCOUNTER — Inpatient Hospital Stay: Payer: Medicare HMO

## 2020-01-13 ENCOUNTER — Other Ambulatory Visit: Payer: Self-pay

## 2020-01-13 ENCOUNTER — Other Ambulatory Visit: Payer: Self-pay | Admitting: Hematology and Oncology

## 2020-01-13 VITALS — BP 159/87 | HR 82 | Temp 99.1°F | Resp 18 | Ht 64.0 in

## 2020-01-13 DIAGNOSIS — E538 Deficiency of other specified B group vitamins: Secondary | ICD-10-CM | POA: Insufficient documentation

## 2020-01-13 DIAGNOSIS — N2889 Other specified disorders of kidney and ureter: Secondary | ICD-10-CM | POA: Diagnosis not present

## 2020-01-13 DIAGNOSIS — C880 Waldenstrom macroglobulinemia: Secondary | ICD-10-CM | POA: Insufficient documentation

## 2020-01-13 DIAGNOSIS — E785 Hyperlipidemia, unspecified: Secondary | ICD-10-CM | POA: Insufficient documentation

## 2020-01-13 DIAGNOSIS — I4891 Unspecified atrial fibrillation: Secondary | ICD-10-CM | POA: Diagnosis not present

## 2020-01-13 DIAGNOSIS — Z79899 Other long term (current) drug therapy: Secondary | ICD-10-CM | POA: Insufficient documentation

## 2020-01-13 DIAGNOSIS — N189 Chronic kidney disease, unspecified: Secondary | ICD-10-CM | POA: Diagnosis not present

## 2020-01-13 DIAGNOSIS — D649 Anemia, unspecified: Secondary | ICD-10-CM

## 2020-01-13 DIAGNOSIS — I10 Essential (primary) hypertension: Secondary | ICD-10-CM | POA: Diagnosis not present

## 2020-01-13 DIAGNOSIS — N178 Other acute kidney failure: Secondary | ICD-10-CM

## 2020-01-13 DIAGNOSIS — Z7901 Long term (current) use of anticoagulants: Secondary | ICD-10-CM | POA: Diagnosis not present

## 2020-01-13 DIAGNOSIS — D696 Thrombocytopenia, unspecified: Secondary | ICD-10-CM | POA: Diagnosis not present

## 2020-01-13 DIAGNOSIS — C8300 Small cell B-cell lymphoma, unspecified site: Secondary | ICD-10-CM

## 2020-01-13 LAB — CBC WITH DIFFERENTIAL/PLATELET
Abs Immature Granulocytes: 0.15 10*3/uL — ABNORMAL HIGH (ref 0.00–0.07)
Basophils Absolute: 0 10*3/uL (ref 0.0–0.1)
Basophils Relative: 1 %
Eosinophils Absolute: 0 10*3/uL (ref 0.0–0.5)
Eosinophils Relative: 0 %
HCT: 42.8 % (ref 36.0–46.0)
Hemoglobin: 13.4 g/dL (ref 12.0–15.0)
Immature Granulocytes: 4 %
Lymphocytes Relative: 14 %
Lymphs Abs: 0.6 10*3/uL — ABNORMAL LOW (ref 0.7–4.0)
MCH: 25.6 pg — ABNORMAL LOW (ref 26.0–34.0)
MCHC: 31.3 g/dL (ref 30.0–36.0)
MCV: 81.8 fL (ref 80.0–100.0)
Monocytes Absolute: 0.5 10*3/uL (ref 0.1–1.0)
Monocytes Relative: 13 %
Neutro Abs: 2.9 10*3/uL (ref 1.7–7.7)
Neutrophils Relative %: 68 %
Platelets: 86 10*3/uL — ABNORMAL LOW (ref 150–400)
RBC: 5.23 MIL/uL — ABNORMAL HIGH (ref 3.87–5.11)
RDW: 16.4 % — ABNORMAL HIGH (ref 11.5–15.5)
WBC: 4.2 10*3/uL (ref 4.0–10.5)
nRBC: 0 % (ref 0.0–0.2)

## 2020-01-13 LAB — COMPREHENSIVE METABOLIC PANEL
ALT: 17 U/L (ref 0–44)
AST: 22 U/L (ref 15–41)
Albumin: 3.1 g/dL — ABNORMAL LOW (ref 3.5–5.0)
Alkaline Phosphatase: 44 U/L (ref 38–126)
Anion gap: 12 (ref 5–15)
BUN: 30 mg/dL — ABNORMAL HIGH (ref 8–23)
CO2: 22 mmol/L (ref 22–32)
Calcium: 8.1 mg/dL — ABNORMAL LOW (ref 8.9–10.3)
Chloride: 108 mmol/L (ref 98–111)
Creatinine, Ser: 1.4 mg/dL — ABNORMAL HIGH (ref 0.44–1.00)
GFR calc Af Amer: 39 mL/min — ABNORMAL LOW (ref >60)
GFR calc non Af Amer: 33 mL/min — ABNORMAL LOW (ref >60)
Glucose, Bld: 110 mg/dL — ABNORMAL HIGH (ref 70–99)
Potassium: 3.8 mmol/L (ref 3.5–5.1)
Sodium: 142 mmol/L (ref 135–145)
Total Bilirubin: 1 mg/dL (ref 0.3–1.2)
Total Protein: 6.9 g/dL (ref 6.5–8.1)

## 2020-01-13 LAB — IRON AND TIBC
Iron: 29 ug/dL (ref 28–170)
Saturation Ratios: 11 % (ref 10.4–31.8)
TIBC: 272 ug/dL (ref 250–450)
UIBC: 243 ug/dL

## 2020-01-13 LAB — RETICULOCYTES
Immature Retic Fract: 16.5 % — ABNORMAL HIGH (ref 2.3–15.9)
RBC.: 5.14 MIL/uL — ABNORMAL HIGH (ref 3.87–5.11)
Retic Count, Absolute: 45.2 10*3/uL (ref 19.0–186.0)
Retic Ct Pct: 0.9 % (ref 0.4–3.1)

## 2020-01-13 LAB — LACTATE DEHYDROGENASE: LDH: 270 U/L — ABNORMAL HIGH (ref 98–192)

## 2020-01-13 LAB — FERRITIN: Ferritin: 277 ng/mL (ref 11–307)

## 2020-01-13 LAB — FOLATE: Folate: 11.2 ng/mL (ref >5.9)

## 2020-01-13 LAB — URIC ACID: Uric Acid, Serum: 7.6 mg/dL — ABNORMAL HIGH (ref 2.5–7.1)

## 2020-01-13 MED ORDER — CYANOCOBALAMIN 1000 MCG/ML IJ SOLN
1000.0000 ug | Freq: Once | INTRAMUSCULAR | Status: AC
  Administered 2020-01-13: 1000 ug via INTRAMUSCULAR

## 2020-01-13 NOTE — Patient Instructions (Signed)

## 2020-01-13 NOTE — Progress Notes (Signed)
Irregular hr apical pulse 82 today.

## 2020-01-14 LAB — PROTEIN ELECTROPHORESIS, SERUM
A/G Ratio: 0.9 (ref 0.7–1.7)
Albumin ELP: 3 g/dL (ref 2.9–4.4)
Alpha-1-Globulin: 0.3 g/dL (ref 0.0–0.4)
Alpha-2-Globulin: 0.8 g/dL (ref 0.4–1.0)
Beta Globulin: 0.8 g/dL (ref 0.7–1.3)
Gamma Globulin: 1.3 g/dL (ref 0.4–1.8)
Globulin, Total: 3.2 g/dL (ref 2.2–3.9)
M-Spike, %: 0.6 g/dL — ABNORMAL HIGH
Total Protein ELP: 6.2 g/dL (ref 6.0–8.5)

## 2020-01-14 LAB — IGM: IgM (Immunoglobulin M), Srm: 950 mg/dL — ABNORMAL HIGH (ref 26–217)

## 2020-01-16 ENCOUNTER — Ambulatory Visit: Payer: Medicare HMO | Admitting: Family

## 2020-01-16 ENCOUNTER — Ambulatory Visit: Payer: Medicare HMO | Admitting: Physician Assistant

## 2020-01-23 NOTE — Progress Notes (Deleted)
   Patient ID: BRET STAMOUR, female    DOB: 1929/06/06, 86 y.o.   MRN: [id number]  HPI  Ms Benito is a 86 y/o female with a history of  Echo report from 12/31/19 reviewed and showed an EF of 50-55% along with mild LVH, mild MR and mild/moderate TR.   Admitted 12/31/19 due to acute on chronic heart failure. Cardiology consult obtained. Antibiotics given for atelectasis. Initially given IV lasix and then transitioned to oral diuretics. Elevated troponin thought to be due to demand ischemia. Discharged after 9 days. Was in the ED 12/16/19 due to hematuria where she was evaluated and released.   She presents today for her initial visit with a chief complaint of   Review of Systems    Physical Exam  Assessment & Plan:  1: Chronic heart failure with preserved ejection fraction with structural changes- - NYHA class  2: HTN- - BP

## 2020-01-24 ENCOUNTER — Inpatient Hospital Stay
Admission: EM | Admit: 2020-01-24 | Discharge: 2020-01-29 | DRG: 177 | Disposition: A | Discharge status: Skilled Nursing Facility | Payer: Medicare HMO | Attending: Hospitalist | Admitting: Hospitalist

## 2020-01-24 ENCOUNTER — Ambulatory Visit: Payer: Medicare HMO | Admitting: Family

## 2020-01-24 ENCOUNTER — Other Ambulatory Visit: Payer: Self-pay

## 2020-01-24 ENCOUNTER — Emergency Department: Payer: Medicare HMO

## 2020-01-24 DIAGNOSIS — Z66 Do not resuscitate: Secondary | ICD-10-CM | POA: Diagnosis not present

## 2020-01-24 DIAGNOSIS — I700 Atherosclerosis of aorta: Secondary | ICD-10-CM | POA: Diagnosis present

## 2020-01-24 DIAGNOSIS — Z79899 Other long term (current) drug therapy: Secondary | ICD-10-CM

## 2020-01-24 DIAGNOSIS — C8300 Small cell B-cell lymphoma, unspecified site: Secondary | ICD-10-CM

## 2020-01-24 DIAGNOSIS — I130 Hypertensive heart and chronic kidney disease with heart failure and stage 1 through stage 4 chronic kidney disease, or unspecified chronic kidney disease: Secondary | ICD-10-CM | POA: Diagnosis present

## 2020-01-24 DIAGNOSIS — Z90710 Acquired absence of both cervix and uterus: Secondary | ICD-10-CM

## 2020-01-24 DIAGNOSIS — R1084 Generalized abdominal pain: Secondary | ICD-10-CM | POA: Diagnosis present

## 2020-01-24 DIAGNOSIS — C8590 Non-Hodgkin lymphoma, unspecified, unspecified site: Secondary | ICD-10-CM | POA: Diagnosis present

## 2020-01-24 DIAGNOSIS — Z515 Encounter for palliative care: Secondary | ICD-10-CM

## 2020-01-24 DIAGNOSIS — N189 Chronic kidney disease, unspecified: Secondary | ICD-10-CM | POA: Diagnosis not present

## 2020-01-24 DIAGNOSIS — N200 Calculus of kidney: Secondary | ICD-10-CM | POA: Diagnosis present

## 2020-01-24 DIAGNOSIS — R778 Other specified abnormalities of plasma proteins: Secondary | ICD-10-CM | POA: Diagnosis not present

## 2020-01-24 DIAGNOSIS — K5730 Diverticulosis of large intestine without perforation or abscess without bleeding: Secondary | ICD-10-CM | POA: Diagnosis present

## 2020-01-24 DIAGNOSIS — N179 Acute kidney failure, unspecified: Secondary | ICD-10-CM | POA: Diagnosis present

## 2020-01-24 DIAGNOSIS — N390 Urinary tract infection, site not specified: Secondary | ICD-10-CM | POA: Diagnosis present

## 2020-01-24 DIAGNOSIS — Z808 Family history of malignant neoplasm of other organs or systems: Secondary | ICD-10-CM

## 2020-01-24 DIAGNOSIS — I4891 Unspecified atrial fibrillation: Secondary | ICD-10-CM | POA: Diagnosis not present

## 2020-01-24 DIAGNOSIS — N184 Chronic kidney disease, stage 4 (severe): Secondary | ICD-10-CM | POA: Diagnosis present

## 2020-01-24 DIAGNOSIS — R112 Nausea with vomiting, unspecified: Secondary | ICD-10-CM | POA: Diagnosis not present

## 2020-01-24 DIAGNOSIS — I248 Other forms of acute ischemic heart disease: Secondary | ICD-10-CM | POA: Diagnosis present

## 2020-01-24 DIAGNOSIS — I493 Ventricular premature depolarization: Secondary | ICD-10-CM | POA: Diagnosis present

## 2020-01-24 DIAGNOSIS — G629 Polyneuropathy, unspecified: Secondary | ICD-10-CM | POA: Diagnosis present

## 2020-01-24 DIAGNOSIS — Z807 Family history of other malignant neoplasms of lymphoid, hematopoietic and related tissues: Secondary | ICD-10-CM | POA: Diagnosis not present

## 2020-01-24 DIAGNOSIS — U071 COVID-19: Principal | ICD-10-CM | POA: Diagnosis present

## 2020-01-24 DIAGNOSIS — M109 Gout, unspecified: Secondary | ICD-10-CM | POA: Diagnosis present

## 2020-01-24 DIAGNOSIS — E785 Hyperlipidemia, unspecified: Secondary | ICD-10-CM | POA: Diagnosis present

## 2020-01-24 DIAGNOSIS — Z7901 Long term (current) use of anticoagulants: Secondary | ICD-10-CM

## 2020-01-24 DIAGNOSIS — J1282 Pneumonia due to coronavirus disease 2019: Secondary | ICD-10-CM | POA: Diagnosis present

## 2020-01-24 DIAGNOSIS — I4819 Other persistent atrial fibrillation: Secondary | ICD-10-CM | POA: Diagnosis present

## 2020-01-24 DIAGNOSIS — I5032 Chronic diastolic (congestive) heart failure: Secondary | ICD-10-CM | POA: Diagnosis present

## 2020-01-24 DIAGNOSIS — T68XXXA Hypothermia, initial encounter: Secondary | ICD-10-CM | POA: Diagnosis present

## 2020-01-24 DIAGNOSIS — Z7189 Other specified counseling: Secondary | ICD-10-CM | POA: Diagnosis not present

## 2020-01-24 LAB — COMPREHENSIVE METABOLIC PANEL
ALT: 15 U/L (ref 0–44)
AST: 23 U/L (ref 15–41)
Albumin: 2.9 g/dL — ABNORMAL LOW (ref 3.5–5.0)
Alkaline Phosphatase: 52 U/L (ref 38–126)
Anion gap: 11 (ref 5–15)
BUN: 31 mg/dL — ABNORMAL HIGH (ref 8–23)
CO2: 21 mmol/L — ABNORMAL LOW (ref 22–32)
Calcium: 8.6 mg/dL — ABNORMAL LOW (ref 8.9–10.3)
Chloride: 108 mmol/L (ref 98–111)
Creatinine, Ser: 1.44 mg/dL — ABNORMAL HIGH (ref 0.44–1.00)
GFR calc Af Amer: 37 mL/min — ABNORMAL LOW (ref >60)
GFR calc non Af Amer: 32 mL/min — ABNORMAL LOW (ref >60)
Glucose, Bld: 176 mg/dL — ABNORMAL HIGH (ref 70–99)
Potassium: 4 mmol/L (ref 3.5–5.1)
Sodium: 140 mmol/L (ref 135–145)
Total Bilirubin: 1.5 mg/dL — ABNORMAL HIGH (ref 0.3–1.2)
Total Protein: 6.4 g/dL — ABNORMAL LOW (ref 6.5–8.1)

## 2020-01-24 LAB — CBC WITH DIFFERENTIAL/PLATELET
Abs Immature Granulocytes: 0.14 10*3/uL — ABNORMAL HIGH (ref 0.00–0.07)
Basophils Absolute: 0 10*3/uL (ref 0.0–0.1)
Basophils Relative: 1 %
Eosinophils Absolute: 0 10*3/uL (ref 0.0–0.5)
Eosinophils Relative: 0 %
HCT: 45.2 % (ref 36.0–46.0)
Hemoglobin: 13.8 g/dL (ref 12.0–15.0)
Immature Granulocytes: 3 %
Lymphocytes Relative: 14 %
Lymphs Abs: 0.7 10*3/uL (ref 0.7–4.0)
MCH: 25.2 pg — ABNORMAL LOW (ref 26.0–34.0)
MCHC: 30.5 g/dL (ref 30.0–36.0)
MCV: 82.6 fL (ref 80.0–100.0)
Monocytes Absolute: 0.4 10*3/uL (ref 0.1–1.0)
Monocytes Relative: 9 %
Neutro Abs: 3.8 10*3/uL (ref 1.7–7.7)
Neutrophils Relative %: 73 %
Platelets: 150 10*3/uL (ref 150–400)
RBC: 5.47 MIL/uL — ABNORMAL HIGH (ref 3.87–5.11)
RDW: 16 % — ABNORMAL HIGH (ref 11.5–15.5)
WBC: 5.1 10*3/uL (ref 4.0–10.5)
nRBC: 0 % (ref 0.0–0.2)

## 2020-01-24 LAB — LACTIC ACID, PLASMA
Lactic Acid, Venous: 2.2 mmol/L (ref 0.5–1.9)
Lactic Acid, Venous: 1.9 mmol/L (ref 0.5–1.9)

## 2020-01-24 LAB — ABO/RH: ABO/RH(D): O NEG

## 2020-01-24 LAB — RESPIRATORY PANEL BY RT PCR (FLU A&B, COVID)
Influenza A by PCR: NEGATIVE
Influenza B by PCR: NEGATIVE
SARS Coronavirus 2 by RT PCR: POSITIVE — AB

## 2020-01-24 LAB — TROPONIN I (HIGH SENSITIVITY)
Troponin I (High Sensitivity): 294 ng/L (ref <18)
Troponin I (High Sensitivity): 256 ng/L (ref <18)

## 2020-01-24 LAB — URINALYSIS, COMPLETE (UACMP) WITH MICROSCOPIC
Glucose, UA: NEGATIVE mg/dL
Ketones, ur: NEGATIVE mg/dL
Nitrite: NEGATIVE
Protein, ur: >=300 mg/dL — AB
Specific Gravity, Urine: 1.024 (ref 1.005–1.030)
pH: 5 (ref 5.0–8.0)

## 2020-01-24 LAB — FIBRINOGEN: Fibrinogen: 408 mg/dL (ref 210–475)

## 2020-01-24 LAB — FIBRIN DERIVATIVES D-DIMER (ARMC ONLY): Fibrin derivatives D-dimer (ARMC): 6787.94 ng/mL (FEU) — ABNORMAL HIGH (ref 0.00–499.00)

## 2020-01-24 LAB — FERRITIN: Ferritin: 149 ng/mL (ref 11–307)

## 2020-01-24 LAB — BRAIN NATRIURETIC PEPTIDE: B Natriuretic Peptide: 262 pg/mL — ABNORMAL HIGH (ref 0.0–100.0)

## 2020-01-24 LAB — LIPASE, BLOOD: Lipase: 37 U/L (ref 11–51)

## 2020-01-24 LAB — PROCALCITONIN: Procalcitonin: <0.1 ng/mL

## 2020-01-24 LAB — LACTATE DEHYDROGENASE: LDH: 204 U/L — ABNORMAL HIGH (ref 98–192)

## 2020-01-24 LAB — C-REACTIVE PROTEIN: CRP: 1 mg/dL — ABNORMAL HIGH (ref <1.0)

## 2020-01-24 MED ORDER — HYDRALAZINE HCL 20 MG/ML IJ SOLN
10.0000 mg | INTRAMUSCULAR | Status: DC | PRN
  Administered 2020-01-24 – 2020-01-28 (×2): 10 mg via INTRAVENOUS
  Filled 2020-01-24 (×4): qty 1

## 2020-01-24 MED ORDER — SODIUM CHLORIDE 0.9 % IV BOLUS
500.0000 mL | Freq: Once | INTRAVENOUS | Status: AC
  Administered 2020-01-24: 500 mL via INTRAVENOUS

## 2020-01-24 MED ORDER — MORPHINE SULFATE (PF) 2 MG/ML IV SOLN
2.0000 mg | Freq: Once | INTRAVENOUS | Status: AC
  Administered 2020-01-24: 2 mg via INTRAVENOUS

## 2020-01-24 MED ORDER — SODIUM CHLORIDE 0.9 % IV SOLN: INTRAVENOUS | Status: DC

## 2020-01-24 MED ORDER — SODIUM CHLORIDE 0.9 % IV SOLN
200.0000 mg | Freq: Once | INTRAVENOUS | Status: AC
  Administered 2020-01-24: 200 mg via INTRAVENOUS
  Filled 2020-01-24: qty 40

## 2020-01-24 MED ORDER — FAMOTIDINE 20 MG PO TABS: 20.0000 mg | ORAL_TABLET | Freq: Every day | ORAL | Status: DC

## 2020-01-24 MED ORDER — METOPROLOL SUCCINATE ER 50 MG PO TB24
75.0000 mg | ORAL_TABLET | Freq: Two times a day (BID) | ORAL | Status: DC
  Administered 2020-01-24 – 2020-01-28 (×10): 75 mg via ORAL
  Filled 2020-01-24 (×10): qty 1
  Filled 2020-01-24: qty 2
  Filled 2020-01-24: qty 1

## 2020-01-24 MED ORDER — FENTANYL CITRATE (PF) 100 MCG/2ML IJ SOLN
25.0000 ug | Freq: Once | INTRAMUSCULAR | Status: AC
  Administered 2020-01-24: 25 ug via INTRAVENOUS
  Filled 2020-01-24: qty 2

## 2020-01-24 MED ORDER — HYDROCOD POLST-CPM POLST ER 10-8 MG/5ML PO SUER: 5.0000 mL | Freq: Two times a day (BID) | ORAL | Status: DC | PRN

## 2020-01-24 MED ORDER — DILTIAZEM HCL 25 MG/5ML IV SOLN
10.0000 mg | INTRAVENOUS | Status: DC | PRN
  Administered 2020-01-24: 10 mg via INTRAVENOUS
  Filled 2020-01-24 (×2): qty 5

## 2020-01-24 MED ORDER — PANTOPRAZOLE SODIUM 40 MG IV SOLR
40.0000 mg | INTRAVENOUS | Status: DC
  Administered 2020-01-24 – 2020-01-26 (×3): 40 mg via INTRAVENOUS
  Filled 2020-01-24 (×3): qty 40

## 2020-01-24 MED ORDER — VITAMIN D 25 MCG (1000 UNIT) PO TABS
1000.0000 [IU] | ORAL_TABLET | Freq: Every day | ORAL | Status: DC
  Administered 2020-01-24 – 2020-01-29 (×6): 1000 [IU] via ORAL
  Filled 2020-01-24 (×6): qty 1

## 2020-01-24 MED ORDER — MAGNESIUM HYDROXIDE 400 MG/5ML PO SUSP
30.0000 mL | Freq: Every day | ORAL | Status: DC | PRN
  Filled 2020-01-24: qty 30

## 2020-01-24 MED ORDER — ONDANSETRON HCL 4 MG PO TABS: 4.0000 mg | ORAL_TABLET | Freq: Four times a day (QID) | ORAL | Status: DC | PRN

## 2020-01-24 MED ORDER — MORPHINE SULFATE (PF) 2 MG/ML IV SOLN
2.0000 mg | Freq: Once | INTRAVENOUS | Status: AC
  Administered 2020-01-24: 02:00:00 2 mg via INTRAVENOUS
  Filled 2020-01-24: qty 1

## 2020-01-24 MED ORDER — ONDANSETRON HCL 4 MG/2ML IJ SOLN: 4.0000 mg | Freq: Four times a day (QID) | INTRAMUSCULAR | Status: DC | PRN

## 2020-01-24 MED ORDER — DM-GUAIFENESIN ER 30-600 MG PO TB12
1.0000 | ORAL_TABLET | Freq: Two times a day (BID) | ORAL | Status: DC
  Administered 2020-01-24 – 2020-01-29 (×11): 1 via ORAL
  Filled 2020-01-24 (×11): qty 1

## 2020-01-24 MED ORDER — TRAZODONE HCL 50 MG PO TABS
25.0000 mg | ORAL_TABLET | Freq: Every evening | ORAL | Status: DC | PRN
  Administered 2020-01-25 – 2020-01-27 (×2): 25 mg via ORAL
  Filled 2020-01-24 (×2): qty 1

## 2020-01-24 MED ORDER — ASCORBIC ACID 500 MG PO TABS
1000.0000 mg | ORAL_TABLET | Freq: Every day | ORAL | Status: DC
  Administered 2020-01-24 – 2020-01-29 (×6): 1000 mg via ORAL
  Filled 2020-01-24 (×6): qty 2

## 2020-01-24 MED ORDER — DEXAMETHASONE SODIUM PHOSPHATE 10 MG/ML IJ SOLN
6.0000 mg | INTRAMUSCULAR | Status: DC
  Administered 2020-01-24 – 2020-01-27 (×4): 6 mg via INTRAVENOUS
  Filled 2020-01-24 (×4): qty 1

## 2020-01-24 MED ORDER — DEXAMETHASONE SODIUM PHOSPHATE 10 MG/ML IJ SOLN: 6.0000 mg | INTRAMUSCULAR | Status: DC

## 2020-01-24 MED ORDER — SODIUM CHLORIDE 0.9 % IV SOLN
2.0000 g | Freq: Once | INTRAVENOUS | Status: AC
  Administered 2020-01-24: 02:00:00 2 g via INTRAVENOUS
  Filled 2020-01-24: qty 20

## 2020-01-24 MED ORDER — SODIUM CHLORIDE 0.9 % IV SOLN: 1.0000 g | INTRAVENOUS | Status: DC

## 2020-01-24 MED ORDER — METRONIDAZOLE IN NACL 5-0.79 MG/ML-% IV SOLN
500.0000 mg | Freq: Once | INTRAVENOUS | Status: AC
  Administered 2020-01-24: 03:00:00 500 mg via INTRAVENOUS
  Filled 2020-01-24: qty 100

## 2020-01-24 MED ORDER — ALBUTEROL SULFATE HFA 108 (90 BASE) MCG/ACT IN AERS
2.0000 | INHALATION_SPRAY | Freq: Four times a day (QID) | RESPIRATORY_TRACT | Status: DC | PRN
  Filled 2020-01-24: qty 6.7

## 2020-01-24 MED ORDER — ZINC SULFATE 220 (50 ZN) MG PO CAPS
220.0000 mg | ORAL_CAPSULE | Freq: Every day | ORAL | Status: DC
  Administered 2020-01-24 – 2020-01-29 (×6): 220 mg via ORAL
  Filled 2020-01-24 (×6): qty 1

## 2020-01-24 MED ORDER — SODIUM CHLORIDE 0.9 % IV SOLN: 200.0000 mg | Freq: Once | INTRAVENOUS | Status: DC

## 2020-01-24 MED ORDER — ACETAMINOPHEN 325 MG PO TABS
650.0000 mg | ORAL_TABLET | Freq: Four times a day (QID) | ORAL | Status: DC | PRN
  Administered 2020-01-25 – 2020-01-29 (×4): 650 mg via ORAL
  Filled 2020-01-24 (×4): qty 2

## 2020-01-24 MED ORDER — SODIUM CHLORIDE 0.9 % IV BOLUS
300.0000 mL | Freq: Once | INTRAVENOUS | Status: AC
  Administered 2020-01-24: 02:00:00 300 mL via INTRAVENOUS

## 2020-01-24 MED ORDER — GUAIFENESIN-DM 100-10 MG/5ML PO SYRP
10.0000 mL | ORAL_SOLUTION | ORAL | Status: DC | PRN
  Filled 2020-01-24: qty 10

## 2020-01-24 MED ORDER — SODIUM CHLORIDE 0.9 % IV SOLN
100.0000 mg | Freq: Every day | INTRAVENOUS | Status: AC
  Administered 2020-01-25 – 2020-01-28 (×4): 100 mg via INTRAVENOUS
  Filled 2020-01-24: qty 100
  Filled 2020-01-24 (×2): qty 20
  Filled 2020-01-24 (×2): qty 100

## 2020-01-24 MED ORDER — ATORVASTATIN CALCIUM 20 MG PO TABS
20.0000 mg | ORAL_TABLET | Freq: Every day | ORAL | Status: DC
  Administered 2020-01-24 – 2020-01-29 (×6): 20 mg via ORAL
  Filled 2020-01-24 (×6): qty 1

## 2020-01-24 MED ORDER — ASPIRIN EC 81 MG PO TBEC
81.0000 mg | DELAYED_RELEASE_TABLET | Freq: Every day | ORAL | Status: DC
  Administered 2020-01-24 – 2020-01-29 (×6): 81 mg via ORAL
  Filled 2020-01-24 (×6): qty 1

## 2020-01-24 MED ORDER — SODIUM CHLORIDE 0.9 % IV SOLN: 100.0000 mg | Freq: Every day | INTRAVENOUS | Status: DC

## 2020-01-24 MED ORDER — GABAPENTIN 300 MG PO CAPS
300.0000 mg | ORAL_CAPSULE | Freq: Three times a day (TID) | ORAL | Status: DC
  Administered 2020-01-24 – 2020-01-29 (×17): 300 mg via ORAL
  Filled 2020-01-24 (×17): qty 1

## 2020-01-24 MED ORDER — MORPHINE SULFATE (PF) 2 MG/ML IV SOLN
INTRAVENOUS | Status: AC
  Filled 2020-01-24: qty 1

## 2020-01-24 MED ORDER — SODIUM CHLORIDE 0.9 % IV BOLUS (SEPSIS)
1000.0000 mL | Freq: Once | INTRAVENOUS | Status: AC
  Administered 2020-01-24: 02:00:00 1000 mL via INTRAVENOUS

## 2020-01-24 MED ORDER — ALLOPURINOL 100 MG PO TABS
200.0000 mg | ORAL_TABLET | Freq: Every day | ORAL | Status: DC
  Administered 2020-01-24 – 2020-01-29 (×6): 200 mg via ORAL
  Filled 2020-01-24 (×7): qty 2

## 2020-01-24 MED ORDER — ONDANSETRON HCL 4 MG/2ML IJ SOLN
4.0000 mg | Freq: Once | INTRAMUSCULAR | Status: AC
  Administered 2020-01-24: 4 mg via INTRAVENOUS
  Filled 2020-01-24: qty 2

## 2020-01-24 MED ORDER — POTASSIUM CHLORIDE CRYS ER 20 MEQ PO TBCR: 10.0000 meq | EXTENDED_RELEASE_TABLET | Freq: Every day | ORAL | Status: DC

## 2020-01-24 MED ORDER — SODIUM CHLORIDE 0.9 % IV BOLUS (SEPSIS): 800.0000 mL | Freq: Once | INTRAVENOUS | Status: DC

## 2020-01-24 MED ORDER — DILTIAZEM HCL 25 MG/5ML IV SOLN
5.0000 mg | Freq: Once | INTRAVENOUS | Status: AC
  Administered 2020-01-24: 5 mg via INTRAVENOUS
  Filled 2020-01-24: qty 5

## 2020-01-24 MED ORDER — ENOXAPARIN SODIUM 40 MG/0.4ML ~~LOC~~ SOLN: 40.0000 mg | SUBCUTANEOUS | Status: DC

## 2020-01-24 MED ORDER — RIVAROXABAN 15 MG PO TABS
15.0000 mg | ORAL_TABLET | Freq: Every day | ORAL | Status: DC
  Administered 2020-01-24 – 2020-01-29 (×6): 15 mg via ORAL
  Filled 2020-01-24 (×6): qty 1

## 2020-01-24 NOTE — ED Notes (Signed)
Recorded rectal temperature of 95.7. adjusted bear hugger on pt and blankets.

## 2020-01-24 NOTE — Consult Note (Signed)
Cardiology Consultation:   Patient ID: Kristy Solomon MRN: [id number]; DOB: 04-18-1929  Admit date: 01/24/2020 Date of Consult: 01/24/2020  Primary Care Provider: Jodi Marble, MD Primary Cardiologist:Dr. Rockey Situ Primary Electrophysiologist:  None    Patient Profile:   Kristy Solomon is a 86 y.o. female with a hx of HFpEF, persistent atrial fibrillation, HTN, HLD, lymphoma, CKD, chronic LEE, and anemia/thrombocytopenia, and who is being seen today for the evaluation of COVID-19 and elevated troponin at the request of Dr. Sidney Ace.  History of Present Illness:   Kristy Solomon is an 86 year old female with PMH as above.  She was last seen in the hospital 01/05/2020, at which time she reported significant weight gain (15 to 20 pounds) over the prior 3 months with worsening/progressive lower extremity edema.  She also reported DOE and difficulty getting around.  She noted orthopnea, sleeping in her recliner.  She had presented to primary care and received increasing doses of Lasix without improvement.  She then presented to the hospital 12/31/2019.  She underwent IV diuresis with improvement in her volume status and eventual overdiuresis during her admission with recommendation to wait a week before restarting her torsemide (start date 01/11/20).  She was transitioned from Eliquis to Xarelto 15 mg daily for her atrial fibrillation, as she reportedly did not tolerate Eliquis.  She was continued on BB. Of note, towards the end of admission, she reported cough and sputum with worsening SOB. CXR was consistent with LLL PNA, and she was started on IV antibiotics with Levaquin.  This was transitioned to oral Levaquin at discharge with last dose estimated 01/19/20. It was also noted that her malignant lymphoma was thought the likely cause of her leukopenia and thrombocytopenia.  Follow-up with oncology as an outpatient was recommended.  It was also recommended that she follow-up with Dr. Juleen China regarding her  renal function. She was discharged 01/09/20.  On 01/24/2020, she presented to the [facility] ED from her nursing facility with report of abdominal pain, nausea, and emesis.  She reported that she was eating [employer] when she began to experience dry cough, generalized abdominal pain and nausea, followed by a few episodes of bilious emesis versus hematemesis. She also reported mild DOE without wheezing.  In the ED, she tested positive for COVID-19.  Labs showed creatinine 1.44, BUN 31. UA showed trace leukocytes and rare bacteria with small amount of hemoglobin. HS Tn 294  256. EKG showed atrial fibrillation without acute changes. Cardiology was consulted for HS Tn.  *Since admission, CT performed and significant for ground glass opacity, L kidney masslike soft tissue at the left renal hilum and stable since 07/2019 thought 2/2 lymphoma. Superimposed bilateral nephrolithiasis noted with staghorn calculi of the right kidney. Diverticulosis of the descending colon was noted, as well as aortic atherosclerosis.   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 Authorizing Provider  albuterol (VENTOLIN HFA) 108 (90 Base) MCG/ACT inhaler Inhale 2 puffs into the lungs every 6 (six) hours as needed for wheezing or shortness of breath. 01/09/20  Yes Fritzi Mandes, MD  allopurinol (  ZYLOPRIM) 100 MG tablet Take 2 tablets (200 mg total) by mouth daily. 09/11/18  Yes Karen Kitchens, NP  atorvastatin (LIPITOR) 20 MG tablet Take 20 mg by mouth daily. 12/24/13  Yes [provider]  gabapentin (NEURONTIN) 300 MG capsule Take 300 mg by mouth 3  (three) times daily.    Yes [provider]  Ibrutinib (IMBRUVICA) 420 MG TABS Take 420 mg by mouth daily. 11/06/19  Yes Corcoran, Drue Second, MD  metoprolol succinate (TOPROL-XL) 25 MG 24 hr tablet Take 3 tablets (75 mg total) by mouth 2 (two) times daily. 01/09/20  Yes Fritzi Mandes, MD  potassium chloride (MICRO-K) 10 MEQ CR capsule Take 10 mEq by mouth in the morning.  04/25/19  Yes [provider]  Rivaroxaban (XARELTO) 15 MG TABS tablet Take 1 tablet (15 mg total) by mouth daily with supper. 01/09/20  Yes Fritzi Mandes, MD  torsemide (DEMADEX) 20 MG tablet Take 1 tablet (20 mg total) by mouth daily. 01/11/20  Yes Fritzi Mandes, MD  dextromethorphan-guaiFENesin [facility] DM) 30-600 MG 12hr tablet Take 1 tablet by mouth 2 (two) times daily. Patient not taking: Reported on 01/24/2020 01/09/20   Fritzi Mandes, MD    Inpatient Medications: Scheduled Meds:  allopurinol  200 mg Oral Daily   vitamin C  1,000 mg Oral Daily   aspirin EC  81 mg Oral Daily   atorvastatin  20 mg Oral Daily   dexamethasone (DECADRON) injection  6 mg Intravenous Q24H   dextromethorphan-guaiFENesin  1 tablet Oral BID   gabapentin  300 mg Oral TID   metoprolol succinate  75 mg Oral BID   pantoprazole (PROTONIX) IV  40 mg Intravenous Q24H   Rivaroxaban  15 mg Oral Q supper   Continuous Infusions:  [START ON 01/25/2020] remdesivir 100 mg in NS 100 mL     PRN Meds: acetaminophen, albuterol, chlorpheniramine-HYDROcodone, diltiazem, guaiFENesin-dextromethorphan, magnesium hydroxide, traZODone  Allergies:    Allergies  Allergen Reactions   Eliquis [Apixaban]     abd pain    Social History:   Social History   Socioeconomic History   Marital status: Widowed    Spouse name: Not on file   Number of children: Not on file   Years of education: Not on file   Highest education level: Not on file  Occupational History   Not on file  Tobacco Use   Smoking status: Never Smoker   Smokeless tobacco:  Never Used  Substance and Sexual Activity   Alcohol use: No   Drug use: No   Sexual activity: Not on file  Other Topics Concern   Not on file  Social History Narrative   Lives alone. Daughter assists as needed.   Social Determinants of Health   Financial Resource Strain:    Difficulty of Paying Living Expenses:   Food Insecurity:    Worried About Charity fundraiser in the Last Year:    Arboriculturist in the Last Year:   Transportation Needs:    Film/video editor (Medical):    Lack of Transportation (Non-Medical):   Physical Activity:    Days of Exercise per Week:    Minutes of Exercise per Session:   Stress:    Feeling of Stress :   Social Connections:    Frequency of Communication with Friends and Family:    Frequency of Social Gatherings with Friends and Family:    Attends Religious Services:    Active Member of Clubs or Organizations:  Attends Music therapist:    Marital Status:   Intimate Partner Violence:    Fear of Current or Ex-Partner:    Emotionally Abused:    Physically Abused:    Sexually Abused:     Family History:    Family History  Problem Relation Age of Onset   Cancer Brother        throat ca   Cancer Brother        bone cancer     ROS:  Please see the history of present illness.  Review of Systems  Gastrointestinal: Positive for abdominal pain, nausea and vomiting.  Neurological: Positive for weakness.  All other systems reviewed and are negative.   All other ROS reviewed and negative.     Physical Exam/Data:   Vitals:   01/24/20 0725 01/24/20 0730 01/24/20 0756 01/24/20 0845  BP:  (!) 151/91  (!) 192/104  Pulse:   (!) 121 64  Resp:    (!) 23  Temp: (!) 95.7 F (35.4 C)     TempSrc: Rectal     SpO2:   92% 94%  Weight:      Height:        Intake/Output Summary (Last 24 hours) at 01/24/2020 0903 Last data filed at 01/24/2020 0727 Gross per 24 hour  Intake 2150 ml  Output --  Net  2150 ml   Last 3 Weights 01/24/2020 01/09/2020 01/08/2020  Weight (lbs) 220 lb 200 lb 14.4 oz 202 lb 4.8 oz  Weight (kg) 99.791 kg 91.128 kg 91.763 kg     Body mass index is 37.76 kg/m.  Physical exam per MD to reduce risk of transmission of COVID-19  EKG:  The EKG was personally reviewed and demonstrates:  Atrial fibrillation with rapid ventricular response, 101 bpm, PVCs, LAD, borderline LAFB, poor R wave progression in the precordial leads, prolonged QTc Telemetry:  Telemetry was personally reviewed and demonstrates: atrial fibrillation  Relevant CV Studies: Echo 12/31/19 1. Left ventricular ejection fraction, by estimation, is 50 to 55%. The  left ventricle has low normal function. The left ventricle has no regional  wall motion abnormalities. There is mild left ventricular hypertrophy.  Left ventricular diastolic  parameters are indeterminate.  2. Right ventricular systolic function is normal. The right ventricular  size is normal. Tricuspid regurgitation signal is inadequate for assessing  PA pressure.  3. Left atrial size was mildly dilated.  4. Right atrial size was mildly dilated.  5. The mitral valve is abnormal. Mild mitral valve regurgitation. No  evidence of mitral stenosis.  6. Tricuspid valve regurgitation is mild to moderate.  7. The aortic valve is tricuspid. Aortic valve regurgitation is not  visualized. No aortic stenosis is present.  8. The inferior vena cava is dilated in size with <50% respiratory  variability, suggesting right atrial pressure of 15 mmHg.   Monitor  07/2019 Patient had a min HR of 36 bpm, max HR of 171 bpm, and avg HR of 67 bpm. Predominant underlying rhythm was Sinus Rhythm. First Degree AV Block was present. occasional Supraventricular Tachycardia runs occurred, the run with the fastest interval lasting 4 beats with a max rate of 171 bpm, Isolated SVEs were frequent (5.6%, [id number]), SVE Couplets were rare (<1.0%, 4699),  and SVE  Triplets were rare (<1.0%, 508). Isolated VEs were frequent (20.3%, [id number]), VE Couplets were rare (<1.0%, 2113), and VE Triplets were rare (<1.0%, 986).   Laboratory Data:  High Sensitivity Troponin:   Recent Labs  Lab 12/31/19  1110 12/31/19 1350 12/31/19 1637 01/24/20 0106 01/24/20 0300  TROPONINIHS 59* 51* 49* 294* 256*     Cardiac EnzymesNo results for input(s): TROPONINI in the last 168 hours. No results for input(s): TROPIPOC in the last 168 hours.  Chemistry Recent Labs  Lab 01/24/20 0106  NA 140  K 4.0  CL 108  CO2 21*  GLUCOSE 176*  BUN 31*  CREATININE 1.44*  CALCIUM 8.6*  GFRNONAA 32*  GFRAA 37*  ANIONGAP 11    Recent Labs  Lab 01/24/20 0106  PROT 6.4*  ALBUMIN 2.9*  AST 23  ALT 15  ALKPHOS 52  BILITOT 1.5*   Hematology Recent Labs  Lab 01/24/20 0106  WBC 5.1  RBC 5.47*  HGB 13.8  HCT 45.2  MCV 82.6  MCH 25.2*  MCHC 30.5  RDW 16.0*  PLT 150   BNP Recent Labs  Lab 01/24/20 0106  BNP 262.0*    DDimer No results for input(s): DDIMER in the last 168 hours.   Radiology/Studies:  CT Abdomen Pelvis Wo Contrast  Result Date: 01/24/2020 CLINICAL DATA:  86 year old female with abdominal pain, nausea vomiting. History of lymphoma. EXAM: CT ABDOMEN AND PELVIS WITHOUT CONTRAST TECHNIQUE: Multidetector CT imaging of the abdomen and pelvis was performed following the standard protocol without IV contrast. COMPARISON:  CT Abdomen and Pelvis 07/12/2019. FINDINGS: Lower chest: Stable cardiomegaly. No pericardial effusion. Calcified aortic atherosclerosis. Mild lung base pleural thickening or less likely trace pleural fluid. Chronic paraseptal emphysema and mild lung base architectural distortion, but new left greater than right lower lobe and right middle lobe widespread pulmonary ground-glass opacity (series 3, image 6). Mild peribronchial nodularity associated. Lower lung airways appear to remain patent. Hepatobiliary: Negative noncontrast liver and  gallbladder. Mild motion artifact at the level of the gallbladder. Pancreas: Negative. Spleen: Negative. Adrenals/Urinary Tract: Adrenal glands are within normal limits. Bulky chronic right renal staghorn calculi. No acute right hydronephrosis or perinephric stranding. Stable appearance of the proximal right ureter which seems to taper distal to the renal pelvic staghorn calculus. Chronically abnormal left kidney with masslike soft tissue at the left renal hilum and bulky left renal lower pole 2 cm calculus. See series 2, image 31. This process appears stable since October. And there is associated chronic left periureteral stranding which abates at the pelvic inlet. The distal left ureter seems to remain normal. There are multiple chronic pelvic phleboliths. Stable and unremarkable urinary bladder. Stomach/Bowel: No dilated large or small bowel. There is severe diverticulosis of the descending colon, but no convincing active inflammation. There is motion artifact in the mid abdomen. The cecum appears to beyond a lax mesentery and much of the right colon is decompressed. Decompressed terminal ileum. The stomach is mildly to moderately fluid distended. Unremarkable duodenum. No free air, free fluid, or convincing mesenteric stranding. Vascular/Lymphatic: Vascular patency is not evaluated in the absence of IV contrast. Extensive Aortoiliac calcified atherosclerosis. No lymphadenopathy identified in the absence of IV contrast. Reproductive: Surgically absent uterus and diminutive or absent ovaries. Other: No pelvic free fluid. Musculoskeletal: Osteopenia. No acute osseous abnormality identified. IMPRESSION: 1. Bilateral lung base ground-glass opacity is new since October and suspicious for acute viral/atypical respiratory infection. Pulmonary edema is felt less likely, there is chronic cardiomegaly. 2. No superimposed acute or inflammatory process identified in the noncontrast abdomen or pelvis: - stable since October  abnormal left kidney with mass-like soft tissue at the left renal hilum. Etiology uncertain but perhaps due to lymphoma. - bulky superimposed bilateral nephrolithiasis, staghorn  calculi on the right. - diverticulosis of the descending colon but no active inflammation. 3. Aortic Atherosclerosis (ICD10-I70.0). Electronically Signed   By: Genevie Ann M.D.   On: 01/24/2020 05:10   DG Chest Port 1 View  Result Date: 01/24/2020 CLINICAL DATA:  Abdominal pain. Episode of vomiting. EXAM: PORTABLE CHEST 1 VIEW COMPARISON:  01/07/2020, CT 07/12/2019 FINDINGS: Unchanged cardiomegaly. Aortic atherosclerosis and tortuosity. Development of patchy opacity in the right upper lung zone. Worsening opacities at the bases. No pneumothorax. Limited assessment for pleural effusion given habitus. IMPRESSION: 1. Development of patchy opacity in the right upper lung zone, may represent pneumonia. Asymmetric pulmonary edema is also considered. 2. Worsening nonspecific basilar opacities since radiographs earlier this month. 3. Stable cardiomegaly.  Aortic Atherosclerosis (ICD10-I70.0). Electronically Signed   By: Keith Rake M.D.   On: 01/24/2020 01:35    Assessment and Plan:   Elevated HS Tn, likely supply demand ischemia Multifocal PNA secondary to COVID-19 --Patient presented with dry cough and mild DOE, as well as GI symptoms and found to be COVID-19 positive.  She has known atrial fibrillation with tachycardic ventricular rate on presentation.  EKG without acute ST/T changes.  High-sensitivity troponin mildly elevated and flat trending and not consistent with ACS.  Consider supply demand ischemia in the setting of elevated ventricular rate and COVID-19 infection.  No indication for further ischemic work-up at this time. Previous echo as above.   Persistent Atrial fibrillation  --Elevated rates in the setting of COVID-19 infection.  Continue Toprol-XL as BP allows.  Continue anticoagulation with Xarelto 15 mg daily with  daily CBC.  Further recommendations per MD as above.   For questions or updates, please contact [place] Please consult [email or web address] for contact info under     Signed, Arvil Chaco, PA-C  01/24/2020 9:03 AM

## 2020-01-24 NOTE — ED Notes (Signed)
Meal tray at bedside, pt stated she wasn't hungry at this time.

## 2020-01-24 NOTE — H&P (Signed)
[facility] NAME: Kristy Solomon    MR#:  [id number]  DATE OF BIRTH:  10/12/1928  DATE OF ADMISSION:  01/24/2020  PRIMARY CARE PHYSICIAN: Jodi Marble, MD   REQUESTING/REFERRING PHYSICIAN: Lurline Hare, MD  CHIEF COMPLAINT:   Chief Complaint  Patient presents with  . Abdominal Pain    HISTORY OF PRESENT ILLNESS:  Kristy Solomon  is a 86 y.o. African-American female with a known history of hypertension, dyslipidemia, malignant lymphoma and stage IIIb chronic kidney disease, presented to the emergency room with acute onset of generalized abdominal pain, nausea and vomiting without bilious vomitus or hematemesis or diarrhea as well as hypothermia.  She stated that she ate [place] fried chicken before she experienced her symptoms.  She admitted to dry cough and mild dyspnea without wheezing.  No chest pain or palpitations.  She denied any loss of taste or smell.  She was trying to remove her bear hugger in the room.  Upon presentation to the emergency room, temperature was 93.8 axillary, heart rate 105 respiratory to 25 with normal blood pressure the later dropped to 85/7010 with hydration was up to 117/93.  Pulse symmetry was 99 200% on room air.  Labs revealed a blood glucose of 176, BUN of 31 and creatinine 1.44 comparable to 11 days ago.  LFTs showed albumin of 2.9 with total protein of 6.4 and total bili 1.5 and LDH was 294.  Lactic acid was 2.2 later 1.9 with procalcitonin less than 0.1.  CBC was within normal.  Influenza antigens came back negative but COVID-19 PCR came back positive.  UA came back with more than 300 protein 11-20 WBCs and trace leukocytes with rare bacteria.  Blood and urine cultures were sent.  EKG showed Atrial fibrillation with a rate of 101 with PVCs, left axis deviation and moderate voltage criteria for LVH with prolonged QT interval of 510 MS QTC and Q waves in V1 with T wave inversion laterally.. Portable chest ray showed  development of right upper lung zone patchy opacity that may represent pneumonia and asymmetric pulmonary edema is also consistent with worsening nonspecific basilar opacities and stable cardiomegaly with aortic atherosclerosis.  CT of the abdomen and pelvis showed: 1. Bilateral lung base ground-glass opacity is new since October and suspicious for acute viral/atypical respiratory infection. Pulmonary edema is felt less likely, there is chronic cardiomegaly. 2. No superimposed acute or inflammatory process identified in the noncontrast abdomen or pelvis. - stable since October abnormal left kidney with mass-like soft tissue at the left renal hilum. Etiology uncertain but perhaps due to lymphoma. - bulky superimposed bilateral nephrolithiasis, staghorn calculi on the right. - diverticulosis of the descending colon but no active inflammation. 3. Aortic Atherosclerosis  The patient was given IV Rocephin, fentanyl, Flagyl, morphine sulfate, Zofran and hydration with IV normal saline boluses and later on IV Decadron and remdesivir.  She will be admitted to a progressive cardiac unit bed for further evaluation and management. PAST MEDICAL HISTORY:   Past Medical History:  Diagnosis Date  . 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 Hyperlipidemia   . Hypertension   . 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 Renal insufficiency     PAST SURGICAL HISTORY:   Past Surgical History:  Procedure Laterality Date  . ABDOMINAL HYSTERECTOMY    . HEMORRHOID SURGERY      SOCIAL HISTORY:   Social History   Tobacco Use  . Smoking status: Never Smoker  .  Smokeless tobacco: Never Used  Substance Use Topics  . Alcohol use: No    FAMILY HISTORY:   Family History  Problem Relation Age of Onset  . Cancer Brother        throat ca  . Cancer Brother        bone cancer    DRUG ALLERGIES:   Allergies  Allergen Reactions  . Eliquis [Apixaban]     abd pain    REVIEW OF  SYSTEMS:   ROS As per history of present illness. All pertinent systems were reviewed above. Constitutional,  HEENT, cardiovascular, respiratory, GI, GU, musculoskeletal, neuro, psychiatric, endocrine,  integumentary and hematologic systems were reviewed and are otherwise  negative/unremarkable except for positive findings mentioned above in the HPI.   MEDICATIONS AT HOME:   Prior to Admission medications   Medication Sig Start Date End Date Taking? Authorizing Provider  albuterol (VENTOLIN HFA) 108 (90 Base) MCG/ACT inhaler Inhale 2 puffs into the lungs every 6 (six) hours as needed for wheezing or shortness of breath. 01/09/20  Yes Fritzi Mandes, MD  allopurinol (ZYLOPRIM) 100 MG tablet Take 2 tablets (200 mg total) by mouth daily. 09/11/18  Yes Karen Kitchens, NP  atorvastatin (LIPITOR) 20 MG tablet Take 20 mg by mouth daily. 12/24/13  Yes [provider]  gabapentin (NEURONTIN) 300 MG capsule Take 300 mg by mouth 3 (three) times daily.    Yes [provider]  Ibrutinib (IMBRUVICA) 420 MG TABS Take 420 mg by mouth daily. 11/06/19  Yes Corcoran, Drue Second, MD  metoprolol succinate (TOPROL-XL) 25 MG 24 hr tablet Take 3 tablets (75 mg total) by mouth 2 (two) times daily. 01/09/20  Yes Fritzi Mandes, MD  potassium chloride (MICRO-K) 10 MEQ CR capsule Take 10 mEq by mouth in the morning.  04/25/19  Yes [provider]  Rivaroxaban (XARELTO) 15 MG TABS tablet Take 1 tablet (15 mg total) by mouth daily with supper. 01/09/20  Yes Fritzi Mandes, MD  torsemide (DEMADEX) 20 MG tablet Take 1 tablet (20 mg total) by mouth daily. 01/11/20  Yes Fritzi Mandes, MD  dextromethorphan-guaiFENesin [facility] DM) 30-600 MG 12hr tablet Take 1 tablet by mouth 2 (two) times daily. Patient not taking: Reported on 01/24/2020 01/09/20   Fritzi Mandes, MD      VITAL SIGNS:  Blood pressure 114/81, pulse 96, temperature (!) 94.8 F (34.9 C), temperature source Rectal, resp. rate (!) 22, height 5\' 4"  (1.626 m),  weight 99.8 kg, SpO2 100 %.  PHYSICAL EXAMINATION:  Physical Exam  GENERAL:  86 y.o.-year-old African-American female patient lying in the bed with no acute distress.  EYES: Pupils equal, round, reactive to light and accommodation. No scleral icterus. Extraocular muscles intact.  HEENT: Head atraumatic, normocephalic. Oropharynx and nasopharynx clear.  NECK:  Supple, no jugular venous distention. No thyroid enlargement, no tenderness.  LUNGS: Diminished bibasal breath sounds with bibasal crackles. CARDIOVASCULAR: Regular rate and rhythm, S1, S2 normal. No murmurs, rubs, or gallops.  ABDOMEN: Soft, nondistended, nontender. Bowel sounds present. No organomegaly or mass.  EXTREMITIES: No pedal edema, cyanosis, or clubbing.  NEUROLOGIC: Cranial nerves II through XII are intact. Muscle strength 5/5 in all extremities. Sensation intact. Gait not checked.  PSYCHIATRIC: The patient is alert and oriented x 3.  Normal affect and good eye contact. SKIN: No obvious rash, lesion, or ulcer.   LABORATORY PANEL:   CBC Recent Labs  Lab 01/24/20 0106  WBC 5.1  HGB 13.8  HCT 45.2  PLT 150   ------------------------------------------------------------------------------------------------------------------  Chemistries  Recent Labs  Lab 01/24/20 0106  NA 140  K 4.0  CL 108  CO2 21*  GLUCOSE 176*  BUN 31*  CREATININE 1.44*  CALCIUM 8.6*  AST 23  ALT 15  ALKPHOS 52  BILITOT 1.5*   ------------------------------------------------------------------------------------------------------------------  Cardiac Enzymes No results for input(s): TROPONINI in the last 168 hours. ------------------------------------------------------------------------------------------------------------------  RADIOLOGY:  CT Abdomen Pelvis Wo Contrast  Result Date: 01/24/2020 CLINICAL DATA:  86 year old female with abdominal pain, nausea vomiting. History of lymphoma. EXAM: CT ABDOMEN AND PELVIS WITHOUT CONTRAST  TECHNIQUE: Multidetector CT imaging of the abdomen and pelvis was performed following the standard protocol without IV contrast. COMPARISON:  CT Abdomen and Pelvis 07/12/2019. FINDINGS: Lower chest: Stable cardiomegaly. No pericardial effusion. Calcified aortic atherosclerosis. Mild lung base pleural thickening or less likely trace pleural fluid. Chronic paraseptal emphysema and mild lung base architectural distortion, but new left greater than right lower lobe and right middle lobe widespread pulmonary ground-glass opacity (series 3, image 6). Mild peribronchial nodularity associated. Lower lung airways appear to remain patent. Hepatobiliary: Negative noncontrast liver and gallbladder. Mild motion artifact at the level of the gallbladder. Pancreas: Negative. Spleen: Negative. Adrenals/Urinary Tract: Adrenal glands are within normal limits. Bulky chronic right renal staghorn calculi. No acute right hydronephrosis or perinephric stranding. Stable appearance of the proximal right ureter which seems to taper distal to the renal pelvic staghorn calculus. Chronically abnormal left kidney with masslike soft tissue at the left renal hilum and bulky left renal lower pole 2 cm calculus. See series 2, image 31. This process appears stable since October. And there is associated chronic left periureteral stranding which abates at the pelvic inlet. The distal left ureter seems to remain normal. There are multiple chronic pelvic phleboliths. Stable and unremarkable urinary bladder. Stomach/Bowel: No dilated large or small bowel. There is severe diverticulosis of the descending colon, but no convincing active inflammation. There is motion artifact in the mid abdomen. The cecum appears to beyond a lax mesentery and much of the right colon is decompressed. Decompressed terminal ileum. The stomach is mildly to moderately fluid distended. Unremarkable duodenum. No free air, free fluid, or convincing mesenteric stranding.  Vascular/Lymphatic: Vascular patency is not evaluated in the absence of IV contrast. Extensive Aortoiliac calcified atherosclerosis. No lymphadenopathy identified in the absence of IV contrast. Reproductive: Surgically absent uterus and diminutive or absent ovaries. Other: No pelvic free fluid. Musculoskeletal: Osteopenia. No acute osseous abnormality identified. IMPRESSION: 1. Bilateral lung base ground-glass opacity is new since October and suspicious for acute viral/atypical respiratory infection. Pulmonary edema is felt less likely, there is chronic cardiomegaly. 2. No superimposed acute or inflammatory process identified in the noncontrast abdomen or pelvis: - stable since October abnormal left kidney with mass-like soft tissue at the left renal hilum. Etiology uncertain but perhaps due to lymphoma. - bulky superimposed bilateral nephrolithiasis, staghorn calculi on the right. - diverticulosis of the descending colon but no active inflammation. 3. Aortic Atherosclerosis (ICD10-I70.0). Electronically Signed   By: Genevie Ann M.D.   On: 01/24/2020 05:10   DG Chest Port 1 View  Result Date: 01/24/2020 CLINICAL DATA:  Abdominal pain. Episode of vomiting. EXAM: PORTABLE CHEST 1 VIEW COMPARISON:  01/07/2020, CT 07/12/2019 FINDINGS: Unchanged cardiomegaly. Aortic atherosclerosis and tortuosity. Development of patchy opacity in the right upper lung zone. Worsening opacities at the bases. No pneumothorax. Limited assessment for pleural effusion given habitus. IMPRESSION: 1. Development of patchy opacity in the right upper lung zone, may represent pneumonia. Asymmetric pulmonary edema is  also considered. 2. Worsening nonspecific basilar opacities since radiographs earlier this month. 3. Stable cardiomegaly.  Aortic Atherosclerosis (ICD10-I70.0). Electronically Signed   By: Keith Rake M.D.   On: 01/24/2020 01:35      IMPRESSION AND PLAN:  1.  Multifocal pneumonia secondary to COVID-19. -The patient will be  admitted to an isolation monitored cardiac progressive unit bed with droplet and contact precautions. -The patient will be placed on scheduled Mucinex and as needed Tussionex. -Will obtain sputum Gram stain culture and sensitivity and follow blood cultures. -O2 protocol will be followed. -We will follow CRP, ferritin, LDH and D-dimer. -Will follow manual differential for ANC/ALC ratio as well as follow troponin I and daily CBC with manual differential and CMP. - Will place the patient on IV Remdesivir and IV steroid therapy with Decadron with elevated inflammatory markers. -The patient will be placed on vitamin D3, vitamin C, zinc sulfate, p.o. Pepcid and aspirin. -Actemra can be considered for CRP more than 7 with associated hypoxemia.  2.  Elevated troponin I. -We will follow serial troponin I's. -2D echo  will be obtained. -This could be secondary to COVID-19. -We added enteric-coated p.o. aspirin and held her ibrutinib for now due to high risk of bleeding. -Cardiology consult will be obtained. -I notified Dr. Johnsie Cancel about the patient.  3.  Possible UTI. -We will follow urine culture and sensitivity.  The patient will be placed on IV Rocephin.  4.  Atrial fibrillation with mildly rapid ventricular response. -We will continue Toprol-XL and Xarelto. -Heart rate is improving.  5.  Abdominal pain with nausea and vomiting. -This could be GI manifestation COVID-19. -Management as above. -The patient will be hydrated with IV normal saline and as needed antiemetics will be provided.    6.  Gout. -We will continue allopurinol.  7.  Peripheral neuropathy. -Neurontin will be resumed.  8.  Malignant lymphoma. -We are holding off ibrutinib while she is on aspirin due to high risk of bleeding as mentioned above.  When this is resumed, aspirin will need to be stopped.  9.  DVT prophylaxis. -We will continue Xarelto as mentioned above.   All the records are reviewed and case discussed  with ED provider. The plan of care was discussed in details with the patient (and family). I answered all questions. The patient agreed to proceed with the above mentioned plan. Further management will depend upon hospital course.   CODE STATUS: Full code  Status is: Inpatient  Remains inpatient appropriate because:Ongoing diagnostic testing needed not appropriate for outpatient work up, Unsafe d/c plan, IV treatments appropriate due to intensity of illness or inability to take PO and Inpatient level of care appropriate due to severity of illness   Dispo: The patient is from: Home              Anticipated d/c is to: Home              Anticipated d/c date is: 3 days              Patient currently is not medically stable to d/c.    TOTAL TIME TAKING CARE OF THIS PATIENT: 55 minutes.    Christel Mormon M.D on 01/24/2020 at 5:33 AM  [facility]   From 7 PM-7 AM, contact night-coverage [email or web address]  CC: Primary care physician; Jodi Marble, MD   Note: This dictation was prepared with Dragon dictation along with smaller phrase technology. Any transcriptional errors that result from this process are  unintentional. 

## 2020-01-24 NOTE — ED Notes (Signed)
Offered to call family for pt, pt declined.

## 2020-01-24 NOTE — ED Notes (Signed)
Family updated as to patient's status. Spoke w/ pt's daughter Avelino Leeds.

## 2020-01-24 NOTE — ED Notes (Signed)
Rectal temp of 98.0 collected, bear hugger DC. Blankets put on pt.

## 2020-01-24 NOTE — Progress Notes (Signed)
Brief hospitalist update note Nonbillable note.  Please see same-day H&P for full billable details.  Briefly, 86 year old female multiple medical comorbidities who presents for evaluation of abdominal pain.  She is found to be Covid positive.  She is not requiring any oxygen.  She does have evidence of infiltrate on chest x-ray concerning for viral syndrome.  Started on IV remdesivir and IV steroids.  Also has atrial fibrillation with rapid ventricular response.  Presumably infectious driven.  Is on metoprolol.  Added Cardizem IV for rate control as needed.  Patient will be admitted to progressive cardiac floor with airborne and contact isolation precautions.  I have called and updated patient's daughter [name] via phone.  All questions answered.  I have also called in consultation with palliative care.  Cardiology also on board.  Ralene Muskrat MD

## 2020-01-24 NOTE — ED Notes (Signed)
Changed pt's bedding and chucks, w/ assistance from ED tech [place].

## 2020-01-24 NOTE — ED Triage Notes (Signed)
Pt to ED via EMS from home reporting abd pain x 11min-1 hour. Pt appears very uncomfortable in triage and is moaning continuously. Pt reports 1 episode of vomiting after having taken tylenol. No changes in BM. Pt is not interacting with staff  Appropriately and not sitting still in wheelchair.

## 2020-01-24 NOTE — Progress Notes (Signed)
CODE SEPSIS - PHARMACY COMMUNICATION  **Broad Spectrum Antibiotics should be administered within 1 hour of Sepsis diagnosis**  Time Code Sepsis Called/Page Received: 0152  Antibiotics Ordered: rocephin/flagyl  Time of 1st antibiotic administration: 0217  Additional action taken by pharmacy:   If necessary, Name of Provider/Nurse Contacted:     Tobie Lords ,PharmD Clinical Pharmacist  01/24/2020  2:57 AM

## 2020-01-24 NOTE — ED Provider Notes (Signed)
[facility] Emergency Department Provider Note   ____________________________________________   First MD Initiated Contact with Patient 01/24/20 [phone]     (approximate)  I have reviewed the triage vital signs and the nursing notes.   HISTORY  Chief Complaint Abdominal Pain    HPI Kristy Solomon is a 86 y.o. female brought to the ED via EMS from nursing facility with a chief complaint of abdominal pain, nausea and vomiting.  Patient with a history of atrial fibrillation on Xarelto, CKD III, CHF who reports eating [employer] and subsequently began to experience generalized abdominal pain and nausea.  Vomited a few times.  Arrives to the ED via EMS moaning.  Denies fever, chest pain, shortness of breath, diarrhea.       Past Medical History:  Diagnosis Date  . 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 Hyperlipidemia   . Hypertension   . 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 Renal insufficiency     Patient Active Problem List   Diagnosis Date Noted  . Pneumonia due to COVID-19 virus 01/24/2020  . Renal mass, left 01/13/2020  . Persistent atrial fibrillation ([place])   . Chronic kidney disease, stage 3b   . Acute on chronic heart failure with preserved ejection fraction (HFpEF) ([place]) 01/06/2020  . Acute decompensated heart failure ([place]) 01/03/2020  . Acute on chronic diastolic CHF (congestive heart failure) ([place]) 12/31/2019  . CKD (chronic kidney disease), stage IV ([place]) 12/31/2019  . Atrial fibrillation, chronic ([place]) 12/31/2019  . Elevated troponin 12/31/2019  . Renal insufficiency 07/28/2019  . Bilateral edema of lower extremity 05/07/2019  . HLD (hyperlipidemia) 09/18/2018  . HTN (hypertension) 09/18/2018  . GERD (gastroesophageal reflux disease) 09/18/2018  . Back pain 09/18/2018  . Bradycardia 09/18/2018  . Bigeminy 09/18/2018  . Nephrolithiasis 01/11/2018  . Goals of care, counseling/discussion 12/28/2017  . Gastroenteritis  12/28/2017  . B12 deficiency 03/18/2016  . Folate deficiency 03/18/2016  . Iron deficiency anemia 03/18/2016  . Pneumonia 03/17/2016  . Pressure ulcer 03/17/2016  . Thrombocytopenia ([place]) 07/04/2014  . Acute renal failure ([place]) 07/04/2014  . Hyperuricemia 07/04/2014  . 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 Waldenstrom macroglobulinemia ([place]) 12/27/2013  . Calculi, ureter 07/03/2012  . Frank hematuria 07/03/2012  . Hydronephrosis 07/03/2012  . 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 Calculus of kidney 07/02/2012  . Nonspecific finding on examination of urine 07/02/2012    Past Surgical History:  Procedure Laterality Date  . ABDOMINAL HYSTERECTOMY    . HEMORRHOID SURGERY      Prior to Admission medications   Medication Sig Start Date End Date Taking? Authorizing Provider  albuterol (VENTOLIN HFA) 108 (90 Base) MCG/ACT inhaler Inhale 2 puffs into the lungs every 6 (six) hours as needed for wheezing or shortness of breath. 01/09/20  Yes Fritzi Mandes, MD  allopurinol (ZYLOPRIM) 100 MG tablet Take 2 tablets (200 mg total) by mouth daily. 09/11/18  Yes Karen Kitchens, NP  atorvastatin (LIPITOR) 20 MG tablet Take 20 mg by mouth daily. 12/24/13  Yes [provider]  gabapentin (NEURONTIN) 300 MG capsule Take 300 mg by mouth 3 (three) times daily.    Yes [provider]  Ibrutinib (IMBRUVICA) 420 MG TABS Take 420 mg by mouth daily. 11/06/19  Yes Corcoran, Drue Second, MD  metoprolol succinate (TOPROL-XL) 25 MG 24 hr tablet Take 3 tablets (75 mg  total) by mouth 2 (two) times daily. 01/09/20  Yes Fritzi Mandes, MD  potassium chloride (MICRO-K) 10 MEQ CR capsule Take 10 mEq by mouth in the morning.  04/25/19  Yes [provider]  Rivaroxaban (XARELTO) 15 MG TABS tablet Take 1 tablet (15 mg total) by mouth daily with supper. 01/09/20   Yes Fritzi Mandes, MD  torsemide (DEMADEX) 20 MG tablet Take 1 tablet (20 mg total) by mouth daily. 01/11/20  Yes Fritzi Mandes, MD  dextromethorphan-guaiFENesin [facility] DM) 30-600 MG 12hr tablet Take 1 tablet by mouth 2 (two) times daily. Patient not taking: Reported on 01/24/2020 01/09/20   Fritzi Mandes, MD    Allergies Eliquis [apixaban]  Family History  Problem Relation Age of Onset  . Cancer Brother        throat ca  . Cancer Brother        bone cancer    Social History Social History   Tobacco Use  . Smoking status: Never Smoker  . Smokeless tobacco: Never Used  Substance Use Topics  . Alcohol use: No  . Drug use: No    Review of Systems  Constitutional: No fever/chills Eyes: No visual changes. ENT: No sore throat. Cardiovascular: Denies chest pain. Respiratory: Denies shortness of breath. Gastrointestinal: Positive for abdominal pain, nausea vomiting.  No diarrhea.  No constipation. Genitourinary: Negative for dysuria. Musculoskeletal: Negative for back pain. Skin: Negative for rash. Neurological: Negative for headaches, focal weakness or numbness.   ____________________________________________   PHYSICAL EXAM:  VITAL SIGNS: ED Triage Vitals  Enc Vitals Group     BP      Pulse      Resp      Temp      Temp src      SpO2      Weight      Height      Head Circumference      Peak Flow      Pain Score      Pain Loc      Pain Edu?      Excl. in [place]?     Constitutional: Alert and oriented.  Elderly appearing and in mild acute distress. Eyes: Conjunctivae are normal. PERRL. EOMI. Head: Atraumatic. Nose: No congestion/rhinnorhea. Mouth/Throat: Mucous membranes are moist.  Oropharynx non-erythematous. Neck: No stridor.   Cardiovascular: Normal rate, irregular rhythm. Grossly normal heart sounds.  Good peripheral circulation. Respiratory: Normal respiratory effort.  No retractions. Lungs CTAB. Gastrointestinal: Soft and nontender but moans on palpation.  No distention. No abdominal bruits. No CVA tenderness. Musculoskeletal: No lower extremity tenderness nor edema.  No joint effusions. Neurologic:  Normal speech and language. No gross focal neurologic deficits are appreciated.  Skin:  Skin is warm, dry and intact. No rash noted. Psychiatric: Mood and affect are normal. Speech and behavior are normal.  ____________________________________________   LABS (all labs ordered are listed, but only abnormal results are displayed)  Labs Reviewed  RESPIRATORY PANEL BY RT PCR (FLU A&B, COVID) - Abnormal; Notable for the following components:      Result Value   SARS Coronavirus 2 by RT PCR POSITIVE (*)    All other components within normal limits  CBC WITH DIFFERENTIAL/PLATELET - Abnormal; Notable for the following components:   RBC 5.47 (*)    MCH 25.2 (*)    RDW 16.0 (*)    Abs Immature Granulocytes 0.14 (*)    All other components within normal limits  LACTIC ACID, PLASMA - Abnormal; Notable for the following  components:   Lactic Acid, Venous 2.2 (*)    All other components within normal limits  COMPREHENSIVE METABOLIC PANEL - Abnormal; Notable for the following components:   CO2 21 (*)    Glucose, Bld 176 (*)    BUN 31 (*)    Creatinine, Ser 1.44 (*)    Calcium 8.6 (*)    Total Protein 6.4 (*)    Albumin 2.9 (*)    Total Bilirubin 1.5 (*)    GFR calc non Af Amer 32 (*)    GFR calc Af Amer 37 (*)    All other components within normal limits  URINALYSIS, COMPLETE (UACMP) WITH MICROSCOPIC - Abnormal; Notable for the following components:   Color, Urine AMBER (*)    APPearance HAZY (*)    Hgb urine dipstick SMALL (*)    Bilirubin Urine SMALL (*)    Protein, ur >=300 (*)    Leukocytes,Ua TRACE (*)    Bacteria, UA RARE (*)    All other components within normal limits  TROPONIN I (HIGH SENSITIVITY) - Abnormal; Notable for the following components:   Troponin I (High Sensitivity) 294 (*)    All other components within normal limits    TROPONIN I (HIGH SENSITIVITY) - Abnormal; Notable for the following components:   Troponin I (High Sensitivity) 256 (*)    All other components within normal limits  CULTURE, BLOOD (ROUTINE X 2)  CULTURE, BLOOD (ROUTINE X 2)  URINE CULTURE  CULTURE, BLOOD (ROUTINE X 2)  CULTURE, BLOOD (ROUTINE X 2)  EXPECTORATED SPUTUM ASSESSMENT W REFEX TO RESP CULTURE  LACTIC ACID, PLASMA  LIPASE, BLOOD  PROCALCITONIN  BRAIN NATRIURETIC PEPTIDE  C-REACTIVE PROTEIN  FIBRIN DERIVATIVES D-DIMER ([facility] ONLY)  FERRITIN  FIBRINOGEN  LACTATE DEHYDROGENASE  ABO/RH  ABO/RH   ____________________________________________  EKG  ED ECG REPORT I, Rikita Grabert J, the attending physician, personally viewed and interpreted this ECG.   Date: 01/24/2020  EKG Time: 0046  Rate: 101  Rhythm: atrial fibrillation, rate 101  Axis: LAD  Intervals:none  ST&T Change: Nonspecific  ____________________________________________  RADIOLOGY  ED MD interpretation: Right patchy opacity concerning for pneumonia; CT suspicious for atypical respiratory infection, no acute intra-abdominal abnormality; stable left kidney soft tissue mass seen in October, chronic right staghorn calculus  Official radiology report(s): CT Abdomen Pelvis Wo Contrast  Result Date: 01/24/2020 CLINICAL DATA:  86 year old female with abdominal pain, nausea vomiting. History of lymphoma. EXAM: CT ABDOMEN AND PELVIS WITHOUT CONTRAST TECHNIQUE: Multidetector CT imaging of the abdomen and pelvis was performed following the standard protocol without IV contrast. COMPARISON:  CT Abdomen and Pelvis 07/12/2019. FINDINGS: Lower chest: Stable cardiomegaly. No pericardial effusion. Calcified aortic atherosclerosis. Mild lung base pleural thickening or less likely trace pleural fluid. Chronic paraseptal emphysema and mild lung base architectural distortion, but new left greater than right lower lobe and right middle lobe widespread pulmonary ground-glass opacity  (series 3, image 6). Mild peribronchial nodularity associated. Lower lung airways appear to remain patent. Hepatobiliary: Negative noncontrast liver and gallbladder. Mild motion artifact at the level of the gallbladder. Pancreas: Negative. Spleen: Negative. Adrenals/Urinary Tract: Adrenal glands are within normal limits. Bulky chronic right renal staghorn calculi. No acute right hydronephrosis or perinephric stranding. Stable appearance of the proximal right ureter which seems to taper distal to the renal pelvic staghorn calculus. Chronically abnormal left kidney with masslike soft tissue at the left renal hilum and bulky left renal lower pole 2 cm calculus. See series 2, image 31. This process appears stable since  October. And there is associated chronic left periureteral stranding which abates at the pelvic inlet. The distal left ureter seems to remain normal. There are multiple chronic pelvic phleboliths. Stable and unremarkable urinary bladder. Stomach/Bowel: No dilated large or small bowel. There is severe diverticulosis of the descending colon, but no convincing active inflammation. There is motion artifact in the mid abdomen. The cecum appears to beyond a lax mesentery and much of the right colon is decompressed. Decompressed terminal ileum. The stomach is mildly to moderately fluid distended. Unremarkable duodenum. No free air, free fluid, or convincing mesenteric stranding. Vascular/Lymphatic: Vascular patency is not evaluated in the absence of IV contrast. Extensive Aortoiliac calcified atherosclerosis. No lymphadenopathy identified in the absence of IV contrast. Reproductive: Surgically absent uterus and diminutive or absent ovaries. Other: No pelvic free fluid. Musculoskeletal: Osteopenia. No acute osseous abnormality identified. IMPRESSION: 1. Bilateral lung base ground-glass opacity is new since October and suspicious for acute viral/atypical respiratory infection. Pulmonary edema is felt less likely,  there is chronic cardiomegaly. 2. No superimposed acute or inflammatory process identified in the noncontrast abdomen or pelvis: - stable since October abnormal left kidney with mass-like soft tissue at the left renal hilum. Etiology uncertain but perhaps due to lymphoma. - bulky superimposed bilateral nephrolithiasis, staghorn calculi on the right. - diverticulosis of the descending colon but no active inflammation. 3. Aortic Atherosclerosis (ICD10-I70.0). Electronically Signed   By: Genevie Ann M.D.   On: 01/24/2020 05:10   DG Chest Port 1 View  Result Date: 01/24/2020 CLINICAL DATA:  Abdominal pain. Episode of vomiting. EXAM: PORTABLE CHEST 1 VIEW COMPARISON:  01/07/2020, CT 07/12/2019 FINDINGS: Unchanged cardiomegaly. Aortic atherosclerosis and tortuosity. Development of patchy opacity in the right upper lung zone. Worsening opacities at the bases. No pneumothorax. Limited assessment for pleural effusion given habitus. IMPRESSION: 1. Development of patchy opacity in the right upper lung zone, may represent pneumonia. Asymmetric pulmonary edema is also considered. 2. Worsening nonspecific basilar opacities since radiographs earlier this month. 3. Stable cardiomegaly.  Aortic Atherosclerosis (ICD10-I70.0). Electronically Signed   By: Keith Rake M.D.   On: 01/24/2020 01:35    ____________________________________________   PROCEDURES  Procedure(s) performed (including Critical Care):  .1-3 Lead EKG Interpretation Performed by: Paulette Blanch, MD Authorized by: Paulette Blanch, MD     Interpretation: abnormal     ECG rate:  98   ECG rate assessment: normal     Rhythm: atrial fibrillation     Ectopy: none     Conduction: normal   Comments:     Patient placed on cardiac monitor to monitor for arrhythmias   CRITICAL CARE Performed by: Paulette Blanch   Total critical care time: 45 minutes  Critical care time was exclusive of separately billable procedures and treating other  patients.  Critical care was necessary to treat or prevent imminent or life-threatening deterioration.  Critical care was time spent personally by me on the following activities: development of treatment plan with patient and/or surrogate as well as nursing, discussions with consultants, evaluation of patient's response to treatment, examination of patient, obtaining history from patient or surrogate, ordering and performing treatments and interventions, ordering and review of laboratory studies, ordering and review of radiographic studies, pulse oximetry and re-evaluation of patient's condition.   ____________________________________________   INITIAL IMPRESSION / ASSESSMENT AND PLAN / ED COURSE  As part of my medical decision making, I reviewed the following data within the [place] notes reviewed and incorporated, Labs reviewed, EKG  interpreted, Old chart reviewed, Radiograph reviewed and Notes from prior ED visits     PABLO MATHURIN was evaluated in Emergency Department on 01/24/2020 for the symptoms described in the history of present illness. She was evaluated in the context of the global COVID-19 pandemic, which necessitated consideration that the patient might be at risk for infection with the SARS-CoV-2 virus that causes COVID-19. Institutional protocols and algorithms that pertain to the evaluation of patients at risk for COVID-19 are in a state of rapid change based on information released by regulatory bodies including the CDC and federal and state organizations. These policies and algorithms were followed during the patient's care in the ED.    86 year old female presenting with abdominal pain, nausea and vomiting after eating [place] [employer] [place]. Differential diagnosis includes, but is not limited to, ovarian cyst, ovarian torsion, acute appendicitis, diverticulitis, urinary tract infection/pyelonephritis, endometriosis, bowel obstruction, colitis, renal  colic, gastroenteritis, hernia, fibroids, etc.  Will obtain lab work, UA.  Perform rectal temperature.  Obtain noncontrast CT abdomen/pelvis.  Infuse judicious IV fluids, IV Zofran for nausea and reassess.   Clinical Course as of Jan 23 537  Fri Jan 24, 2020  0405 Patient is COVID +.  Will start IV Decadron and Remdesivir.  Awaiting CT scan.  Anticipate hospitalization.   [JS]  [id number] Updated patient on CT results.  She is resting more comfortably after IV fentanyl.  Will discuss with hospitalist services for admission.   [JS]    Clinical Course User Index [JS] Paulette Blanch, MD     ____________________________________________   FINAL CLINICAL IMPRESSION(S) / ED DIAGNOSES  Final diagnoses:  Generalized abdominal pain  Hypothermia, initial encounter  Pneumonia due to COVID-19 virus  Non-intractable vomiting with nausea, unspecified vomiting type  Elevated troponin  Atrial fibrillation with rapid ventricular response [facility])     ED Discharge Orders    None       Note:  This document was prepared using Dragon voice recognition software and may include unintentional dictation errors.   Paulette Blanch, MD 01/24/20 [phone]

## 2020-01-24 NOTE — Progress Notes (Signed)
Remdesivir - Pharmacy Brief Note   O:  CXR: IMPRESSION: 1. Development of patchy opacity in the right upper lung zone, may represent pneumonia. Asymmetric pulmonary edema is also considered. 2. Worsening nonspecific basilar opacities since radiographs earlier this month. 3. Stable cardiomegaly.  Aortic Atherosclerosis (ICD10-I70.0). SpO2: 100% on RA   A/P:  Remdesivir 200 mg IVPB once followed by 100 mg IVPB daily x 4 days.   Tobie Lords, PharmD, BCPS Clinical Pharmacist 01/24/2020 4:25 AM

## 2020-01-25 LAB — COMPREHENSIVE METABOLIC PANEL
ALT: 12 U/L (ref 0–44)
AST: 18 U/L (ref 15–41)
Albumin: 3 g/dL — ABNORMAL LOW (ref 3.5–5.0)
Alkaline Phosphatase: 54 U/L (ref 38–126)
Anion gap: 12 (ref 5–15)
BUN: 36 mg/dL — ABNORMAL HIGH (ref 8–23)
CO2: 22 mmol/L (ref 22–32)
Calcium: 8.5 mg/dL — ABNORMAL LOW (ref 8.9–10.3)
Chloride: 108 mmol/L (ref 98–111)
Creatinine, Ser: 1.71 mg/dL — ABNORMAL HIGH (ref 0.44–1.00)
GFR calc Af Amer: 30 mL/min — ABNORMAL LOW (ref >60)
GFR calc non Af Amer: 26 mL/min — ABNORMAL LOW (ref >60)
Glucose, Bld: 118 mg/dL — ABNORMAL HIGH (ref 70–99)
Potassium: 4.2 mmol/L (ref 3.5–5.1)
Sodium: 142 mmol/L (ref 135–145)
Total Bilirubin: 0.9 mg/dL (ref 0.3–1.2)
Total Protein: 6.6 g/dL (ref 6.5–8.1)

## 2020-01-25 LAB — CBC WITH DIFFERENTIAL/PLATELET
Abs Immature Granulocytes: 0.17 10*3/uL — ABNORMAL HIGH (ref 0.00–0.07)
Basophils Absolute: 0 10*3/uL (ref 0.0–0.1)
Basophils Relative: 0 %
Eosinophils Absolute: 0 10*3/uL (ref 0.0–0.5)
Eosinophils Relative: 0 %
HCT: 42 % (ref 36.0–46.0)
Hemoglobin: 13.4 g/dL (ref 12.0–15.0)
Immature Granulocytes: 1 %
Lymphocytes Relative: 5 %
Lymphs Abs: 0.6 10*3/uL — ABNORMAL LOW (ref 0.7–4.0)
MCH: 25.8 pg — ABNORMAL LOW (ref 26.0–34.0)
MCHC: 31.9 g/dL (ref 30.0–36.0)
MCV: 80.8 fL (ref 80.0–100.0)
Monocytes Absolute: 1 10*3/uL (ref 0.1–1.0)
Monocytes Relative: 8 %
Neutro Abs: 10.5 10*3/uL — ABNORMAL HIGH (ref 1.7–7.7)
Neutrophils Relative %: 86 %
Platelets: 141 10*3/uL — ABNORMAL LOW (ref 150–400)
RBC: 5.2 MIL/uL — ABNORMAL HIGH (ref 3.87–5.11)
RDW: 16.6 % — ABNORMAL HIGH (ref 11.5–15.5)
WBC: 12.3 10*3/uL — ABNORMAL HIGH (ref 4.0–10.5)
nRBC: 0 % (ref 0.0–0.2)

## 2020-01-25 LAB — URINE CULTURE: Culture: NO GROWTH

## 2020-01-25 LAB — FIBRIN DERIVATIVES D-DIMER (ARMC ONLY): Fibrin derivatives D-dimer (ARMC): 5183.31 ng/mL (FEU) — ABNORMAL HIGH (ref 0.00–499.00)

## 2020-01-25 LAB — TROPONIN I (HIGH SENSITIVITY): Troponin I (High Sensitivity): 327 ng/L (ref <18)

## 2020-01-25 LAB — MAGNESIUM: Magnesium: 2.2 mg/dL (ref 1.7–2.4)

## 2020-01-25 LAB — FERRITIN: Ferritin: 216 ng/mL (ref 11–307)

## 2020-01-25 LAB — C-REACTIVE PROTEIN: CRP: 7.2 mg/dL — ABNORMAL HIGH (ref <1.0)

## 2020-01-25 NOTE — Progress Notes (Addendum)
Subjective:  No cardiac complaints Combative does not think she has Covid event though her kids have been  In / out of her house   Objective:  Vitals:   01/24/20 1630 01/24/20 1730 01/24/20 2004 01/24/20 2255  BP: (!) 157/109 (!) 157/99 (!) 164/71 101/63  Pulse:  (!) 102 93 87  Resp: (!) 30 17 16 18   Temp:   97.8 F (36.6 C) (!) 97.5 F (36.4 C)  TempSrc:   Oral Oral  SpO2: 97% 93% 100% 94%  Weight:      Height:        Intake/Output from previous day: No intake or output data in the 24 hours ending 01/25/20 1110  Physical Exam: Affect appropriate Chronically ill elderly black female  HEENT: normal Neck supple with no adenopathy JVP normal no bruits no thyromegaly Lungs clear with no wheezing and good diaphragmatic motion Heart:  S1/S2 no murmur, no rub, gallop or click PMI normal Abdomen: benighn, BS positve, no tenderness, no AAA no bruit.  No HSM or HJR Distal pulses intact with no bruits No edema Neuro non-focal Skin warm and dry No muscular weakness   Lab Results: Basic Metabolic Panel: Recent Labs    01/24/20 0106 01/25/20 0723  NA 140 142  K 4.0 4.2  CL 108 108  CO2 21* 22  GLUCOSE 176* 118*  BUN 31* 36*  CREATININE 1.44* 1.71*  CALCIUM 8.6* 8.5*  MG  --  2.2   Liver Function Tests: Recent Labs    01/24/20 0106 01/25/20 0723  AST 23 18  ALT 15 12  ALKPHOS 52 54  BILITOT 1.5* 0.9  PROT 6.4* 6.6  ALBUMIN 2.9* 3.0*   Recent Labs    01/24/20 0106  LIPASE 37   CBC: Recent Labs    01/24/20 0106 01/25/20 0723  WBC 5.1 12.3*  NEUTROABS 3.8 10.5*  HGB 13.8 13.4  HCT 45.2 42.0  MCV 82.6 80.8  PLT 150 141*    Anemia Panel: Recent Labs    01/25/20 0723  FERRITIN 216    Imaging: CT Abdomen Pelvis Wo Contrast  Result Date: 01/24/2020 CLINICAL DATA:  86 year old female with abdominal pain, nausea vomiting. History of lymphoma. EXAM: CT ABDOMEN AND PELVIS WITHOUT CONTRAST TECHNIQUE: Multidetector CT imaging of the abdomen and  pelvis was performed following the standard protocol without IV contrast. COMPARISON:  CT Abdomen and Pelvis 07/12/2019. FINDINGS: Lower chest: Stable cardiomegaly. No pericardial effusion. Calcified aortic atherosclerosis. Mild lung base pleural thickening or less likely trace pleural fluid. Chronic paraseptal emphysema and mild lung base architectural distortion, but new left greater than right lower lobe and right middle lobe widespread pulmonary ground-glass opacity (series 3, image 6). Mild peribronchial nodularity associated. Lower lung airways appear to remain patent. Hepatobiliary: Negative noncontrast liver and gallbladder. Mild motion artifact at the level of the gallbladder. Pancreas: Negative. Spleen: Negative. Adrenals/Urinary Tract: Adrenal glands are within normal limits. Bulky chronic right renal staghorn calculi. No acute right hydronephrosis or perinephric stranding. Stable appearance of the proximal right ureter which seems to taper distal to the renal pelvic staghorn calculus. Chronically abnormal left kidney with masslike soft tissue at the left renal hilum and bulky left renal lower pole 2 cm calculus. See series 2, image 31. This process appears stable since October. And there is associated chronic left periureteral stranding which abates at the pelvic inlet. The distal left ureter seems to remain normal. There are multiple chronic pelvic phleboliths. Stable and unremarkable urinary bladder. Stomach/Bowel: No  dilated large or small bowel. There is severe diverticulosis of the descending colon, but no convincing active inflammation. There is motion artifact in the mid abdomen. The cecum appears to beyond a lax mesentery and much of the right colon is decompressed. Decompressed terminal ileum. The stomach is mildly to moderately fluid distended. Unremarkable duodenum. No free air, free fluid, or convincing mesenteric stranding. Vascular/Lymphatic: Vascular patency is not evaluated in the absence  of IV contrast. Extensive Aortoiliac calcified atherosclerosis. No lymphadenopathy identified in the absence of IV contrast. Reproductive: Surgically absent uterus and diminutive or absent ovaries. Other: No pelvic free fluid. Musculoskeletal: Osteopenia. No acute osseous abnormality identified. IMPRESSION: 1. Bilateral lung base ground-glass opacity is new since October and suspicious for acute viral/atypical respiratory infection. Pulmonary edema is felt less likely, there is chronic cardiomegaly. 2. No superimposed acute or inflammatory process identified in the noncontrast abdomen or pelvis: - stable since October abnormal left kidney with mass-like soft tissue at the left renal hilum. Etiology uncertain but perhaps due to lymphoma. - bulky superimposed bilateral nephrolithiasis, staghorn calculi on the right. - diverticulosis of the descending colon but no active inflammation. 3. Aortic Atherosclerosis (ICD10-I70.0). Electronically Signed   By: Genevie Ann M.D.   On: 01/24/2020 05:10   DG Chest Port 1 View  Result Date: 01/24/2020 CLINICAL DATA:  Abdominal pain. Episode of vomiting. EXAM: PORTABLE CHEST 1 VIEW COMPARISON:  01/07/2020, CT 07/12/2019 FINDINGS: Unchanged cardiomegaly. Aortic atherosclerosis and tortuosity. Development of patchy opacity in the right upper lung zone. Worsening opacities at the bases. No pneumothorax. Limited assessment for pleural effusion given habitus. IMPRESSION: 1. Development of patchy opacity in the right upper lung zone, may represent pneumonia. Asymmetric pulmonary edema is also considered. 2. Worsening nonspecific basilar opacities since radiographs earlier this month. 3. Stable cardiomegaly.  Aortic Atherosclerosis (ICD10-I70.0). Electronically Signed   By: Keith Rake M.D.   On: 01/24/2020 01:35    Cardiac Studies:  ECG: afib PVC LAD no acute changes   Telemetry: afib rates ok 70-90 bpm  Echo: 12/31/19 EF 50-55% mild MR mild bi atrial enlargement    Medications:   . allopurinol  200 mg Oral Daily  . vitamin C  1,000 mg Oral Daily  . aspirin EC  81 mg Oral Daily  . atorvastatin  20 mg Oral Daily  . cholecalciferol  1,000 Units Oral Daily  . dexamethasone (DECADRON) injection  6 mg Intravenous Q24H  . dextromethorphan-guaiFENesin  1 tablet Oral BID  . gabapentin  300 mg Oral TID  . metoprolol succinate  75 mg Oral BID  . pantoprazole (PROTONIX) IV  40 mg Intravenous Q24H  . Rivaroxaban  15 mg Oral Q supper  . zinc sulfate  220 mg Oral Daily     . remdesivir 100 mg in NS 100 mL      Assessment/Plan:   1. PAF:  Since January continue xarelto and metroprolol rates ok Can consider outpatient [facility] after she recovers from [place]:  Rx with steroids and remdesivir with significant RUL and bibasilar patchy infiltrates sats 94%   3. Abdominal Pain:  Improved CT abdomen with no acute findings   4. HLD :  On statin   5. Elevated Troponin:  No chest pain or acute ECG changes likely related to COVID observe   [name] 01/25/2020, 11:10 AM

## 2020-01-25 NOTE — Progress Notes (Signed)
Physical Therapy Evaluation Patient Details Name: Kristy Solomon MRN: [id number] DOB: Jun 20, 1929 Today's Date: 01/25/2020   History of Present Illness  Kristy Solomon  is a 86 y.o. African-American female with a known history of hypertension, dyslipidemia, malignant lymphoma and stage IIIb chronic kidney disease, presented to the emergency room with acute onset of generalized abdominal pain, nausea and vomiting without bilious vomitus or hematemesis or diarrhea as well as hypothermia.  She stated that she ate [place] fried chicken before she experienced her symptoms.  She admitted to dry cough and mild dyspnea without wheezing.  No chest pain or palpitations.  She denied any loss of taste or smell.  She was trying to remove her bear hugger in the room.  Clinical Impression  Patient was  sitting up in bed, brushing her teeth when PT arrived for evaluation. She is MI with bed mobility supine <> sit, MI with transfers sit <> stand with RW, and MI with ambulation using RW for 20 feet. She has no balance deficits in standing or sitting and strength of BLE is WFL.  Patient took a long time to complete tasks for therapist, as well as answering questions. Patient  performed bed mobility, transfers and gait with long delays, repositioning equipment and IV lines after therapist arranged them for safety reasons. It was difficult to hear patient over the TV and the negative air pressure noise. Patient has no skilled PT needs at this time, and no equipment needs.       Follow Up Recommendations No PT follow up    Equipment Recommendations  None recommended by PT    Recommendations for Other Services       Precautions / Restrictions Restrictions Weight Bearing Restrictions: No      Mobility  Bed Mobility Overal bed mobility: Independent             General bed mobility comments: no assist necessary  Transfers Overall transfer level: Modified independent Equipment used: Rolling walker (2  wheeled) Transfers: Sit to/from Stand Sit to Stand: Modified independent (Device/Increase time)         General transfer comment: cues for sit to stand  Ambulation/Gait Ambulation/Gait assistance: Modified independent (Device/Increase time) Gait Distance (Feet): 20 Feet Assistive device: Rolling walker (2 wheeled)       General Gait Details: (step - to)  Stairs            Wheelchair Mobility    Modified Rankin (Stroke Patients Only)       Balance Overall balance assessment: Modified Independent                                           Pertinent Vitals/Pain Pain Assessment: No/denies pain    Home Living Family/patient expects to be discharged to:: Private residence Living Arrangements: Alone       Entrance Stairs-Rails: Right Entrance Stairs-Number of Steps: (1)   Home Equipment: Walker - 2 wheels      Prior Function Level of Independence: Independent with assistive device(s)               Hand Dominance        Extremity/Trunk Assessment   Upper Extremity Assessment Upper Extremity Assessment: Overall WFL for tasks assessed    Lower Extremity Assessment Lower Extremity Assessment: Overall WFL for tasks assessed       Communication   Communication: No difficulties  Cognition Arousal/Alertness: Awake/alert Behavior During Therapy: WFL for tasks assessed/performed Overall Cognitive Status: Within Functional Limits for tasks assessed                                        General Comments      Exercises     Assessment/Plan    PT Assessment Patent does not need any further PT services  PT Problem List Decreased mobility       PT Treatment Interventions      PT Goals (Current goals can be found in the Care Plan section)  Acute Rehab PT Goals Patient Stated Goal: no goals stated PT Goal Formulation: All assessment and education complete, DC therapy    Frequency     Barriers to  discharge        Co-evaluation               AM-PAC PT "6 Clicks" Mobility  Outcome Measure Help needed turning from your back to your side while in a flat bed without using bedrails?: None Help needed moving from lying on your back to sitting on the side of a flat bed without using bedrails?: None Help needed moving to and from a bed to a chair (including a wheelchair)?: None Help needed standing up from a chair using your arms (e.g., wheelchair or bedside chair)?: None Help needed to walk in hospital room?: None Help needed climbing 3-5 steps with a railing? : A Little 6 Click Score: 23    End of Session   Activity Tolerance: Patient tolerated treatment well Patient left: in bed Nurse Communication: Mobility status PT Visit Diagnosis: Difficulty in walking, not elsewhere classified (R26.2)    Time: 1415-1430 PT Time Calculation (min) (ACUTE ONLY): 15 min   Charges:   PT Evaluation $PT Eval Low Complexity: 1 Low            [place], Sherryl Barters, PT DPT 01/25/2020, 3:30 PM

## 2020-01-25 NOTE — Progress Notes (Signed)
PROGRESS NOTE    Kristy Solomon  NOM:[id number] DOB: January 30, 1929 DOA: 01/24/2020 PCP: Jodi Marble, MD   Brief Narrative: 86 year old female multiple medical comorbidities who presents for evaluation of abdominal pain.  She is found to be Covid positive.  She is not requiring any oxygen.  She does have evidence of infiltrate on chest x-ray concerning for viral syndrome.  Started on IV remdesivir and IV steroids.  Also has atrial fibrillation with rapid ventricular response.  Presumably infectious driven.  Is on metoprolol.  Added Cardizem IV for rate control as needed.  Patient will be admitted to progressive cardiac floor with airborne and contact isolation precautions.  I have called and updated patient's daughter [name] via phone.  All questions answered.  I have also called in consultation with palliative care.  Cardiology also on board.  4/24: Patient seen and examined.  Does not believe that she has Covid.  Despite my explanations, she is insistent.  Seen by cardiology.  No change in management from their standpoint.  Patient continues on IV remdesivir and IV steroids.  Remains on room air.  Discussed with palliative care, they were unable to see patient yesterday due to large amount of consults.  We will follow up on Monday   Assessment & Plan:   Active Problems:   Pneumonia due to COVID-19 virus  Pneumonia secondary to COVID-19 virus Patient has had many possible home exposures Continues to think that she does not have Covid despite my explanation She has radiographic evidence of pneumonia on chest x-ray Currently not requiring any oxygen Plan: Continue remdesivir, day 2 of 5 Continue IV steroids, day 2 of 10 Supplemental oxygen as needed Mobilize  Paroxysmal atrial fibrillation Continue metoprolol Continue Xarelto Outpatient cardiology consideration for cardioversion  Abdominal pain CT with no acute findings Improved over interval As needed  antiemetics  Hyperlipidemia Continue statin  Elevated troponin Likely demand ischemia No chest pain or ischemic EKG changes Likely related Covid Observe on telemetry  History of gout No acute flare Continue allopurinol  Peripheral neuropathy Continue Neurontin  Malignant lymphoma Currently under treatment with ibrutinib Currently on hold considering acute illness Outpatient follow-up with oncology  DVT prophylaxis: Xarelto Code Status: Full code Family Communication:  Disposition Plan: Status is: Inpatient  Remains inpatient appropriate because:IV treatments appropriate due to intensity of illness or inability to take PO   Dispo: The patient is from: Home              Anticipated d/c is to: Home              Anticipated d/c date is: 2 days              Patient currently is not medically stable to d/c.         Consultants:   Cardiology  Palliative care  Procedures:   none  Antimicrobials:   Remdesivir    Subjective: Seen and examined Says she does not believe she has covid  Objective: Vitals:   01/24/20 1630 01/24/20 1730 01/24/20 2004 01/24/20 2255  BP: (!) 157/109 (!) 157/99 (!) 164/71 101/63  Pulse:  (!) 102 93 87  Resp: (!) 30 17 16 18   Temp:   97.8 F (36.6 C) (!) 97.5 F (36.4 C)  TempSrc:   Oral Oral  SpO2: 97% 93% 100% 94%  Weight:      Height:       No intake or output data in the 24 hours ending 01/25/20 1200 [employer]  01/24/20 0059  Weight: 99.8 kg    Examination:  General exam: Frail-appearing elderly female Respiratory system: Bibasilar crackles, normal work of breathing Cardiovascular system: Regular rate, irregular rhythm, no murmurs, no pedal edema  gastrointestinal system: Nontender, nondistended, normal bowel sounds Central nervous system: Alert and oriented. No focal neurological deficits. Extremities: Symmetric 5 x 5 power. Skin: No rashes, lesions or ulcers Psychiatry: Judgement and insight appear  normal. Mood & affect appropriate.     Data Reviewed: I have personally reviewed following labs and imaging studies  CBC: Recent Labs  Lab 01/24/20 0106 01/25/20 0723  WBC 5.1 12.3*  NEUTROABS 3.8 10.5*  HGB 13.8 13.4  HCT 45.2 42.0  MCV 82.6 80.8  PLT 150 299*   Basic Metabolic Panel: Recent Labs  Lab 01/24/20 0106 01/25/20 0723  NA 140 142  K 4.0 4.2  CL 108 108  CO2 21* 22  GLUCOSE 176* 118*  BUN 31* 36*  CREATININE 1.44* 1.71*  CALCIUM 8.6* 8.5*  MG  --  2.2   GFR: Estimated Creatinine Clearance: 26.1 mL/min (A) (by C-G formula based on SCr of 1.71 mg/dL (H)). Liver Function Tests: Recent Labs  Lab 01/24/20 0106 01/25/20 0723  AST 23 18  ALT 15 12  ALKPHOS 52 54  BILITOT 1.5* 0.9  PROT 6.4* 6.6  ALBUMIN 2.9* 3.0*   Recent Labs  Lab 01/24/20 0106  LIPASE 37   No results for input(s): AMMONIA in the last 168 hours. Coagulation Profile: No results for input(s): INR, PROTIME in the last 168 hours. Cardiac Enzymes: No results for input(s): CKTOTAL, CKMB, CKMBINDEX, TROPONINI in the last 168 hours. BNP (last 3 results) No results for input(s): PROBNP in the last 8760 hours. HbA1C: No results for input(s): HGBA1C in the last 72 hours. CBG: No results for input(s): GLUCAP in the last 168 hours. Lipid Profile: No results for input(s): CHOL, HDL, LDLCALC, TRIG, CHOLHDL, LDLDIRECT in the last 72 hours. Thyroid Function Tests: No results for input(s): TSH, T4TOTAL, FREET4, T3FREE, THYROIDAB in the last 72 hours. Anemia Panel: Recent Labs    01/24/20 0413 01/25/20 0723  FERRITIN 149 216   Sepsis Labs: Recent Labs  Lab 01/24/20 0106 01/24/20 0300  PROCALCITON <0.10  --   LATICACIDVEN 2.2* 1.9    Recent Results (from the past 240 hour(s))  Urine culture     Status: None   Collection Time: 01/24/20  1:06 AM   Specimen: Urine, Random  Result Value Ref Range Status   Specimen Description   Final    URINE, RANDOM Performed at [facility], [address] [place], [place] [address]    Special Requests   Final    NONE Performed at [facility], [address], [place], [place] [address]    Culture   Final    NO GROWTH Performed at [facility], [place] [address], [place], [place] [address]    Report Status 01/25/2020 FINAL  Final  Culture, blood (routine x 2)     Status: None (Preliminary result)   Collection Time: 01/24/20  1:07 AM   Specimen: BLOOD  Result Value Ref Range Status   Specimen Description BLOOD RAC  Final   Special Requests BOTTLES DRAWN AEROBIC AND ANAEROBIC BCAV  Final   Culture   Final    NO GROWTH 1 DAY Performed at [facility], [address] [place], [place] [address]    Report Status PENDING  Incomplete  Culture, blood (routine x 2)  Status: None (Preliminary result)   Collection Time: 01/24/20  1:07 AM   Specimen: BLOOD  Result Value Ref Range Status   Specimen Description BLOOD LEFT HAND  Final   Special Requests BOTTLES DRAWN AEROBIC AND ANAEROBIC BCAV  Final   Culture   Final    NO GROWTH 1 DAY Performed at [facility], [address], [place], [place] [address]    Report Status PENDING  Incomplete  Respiratory Panel by RT PCR (Flu A&B, Covid) - Nasopharyngeal Swab     Status: Abnormal   Collection Time: 01/24/20  1:11 AM   Specimen: Nasopharyngeal Swab  Result Value Ref Range Status   SARS Coronavirus 2 by RT PCR POSITIVE (A) NEGATIVE Final    Comment: RESULT CALLED TO, READ BACK BY AND VERIFIED WITHBarbette Merino RN [phone] 4/32/21 HNM (NOTE) SARS-CoV-2 target nucleic acids are DETECTED. SARS-CoV-2 RNA is generally detectable in upper respiratory specimens  during the acute phase of infection. Positive results are indicative of the presence of the identified virus, but do not rule out bacterial infection or co-infection with other pathogens not detected by the test. Clinical correlation with patient history and other diagnostic  information is necessary to determine patient infection status. The expected result is Negative. Fact Sheet for Patients:  PinkCheek.be Fact Sheet for Healthcare Providers: GravelBags.it This test is not yet approved or cleared by the [place] FDA and  has been authorized for detection and/or diagnosis of SARS-CoV-2 by FDA under an Emergency Use Authorization (EUA).  This EUA will remain in effect (meaning this test can be used) for t he duration of  the COVID-19 declaration under Section 564(b)(1) of the Act, 21 U.S.C. section 360bbb-3(b)(1), unless the authorization is terminated or revoked sooner.    Influenza A by PCR NEGATIVE NEGATIVE Final   Influenza B by PCR NEGATIVE NEGATIVE Final    Comment: (NOTE) The Xpert Xpress SARS-CoV-2/FLU/RSV assay is intended as an aid in  the diagnosis of influenza from Nasopharyngeal swab specimens and  should not be used as a sole basis for treatment. Nasal washings and  aspirates are unacceptable for Xpert Xpress SARS-CoV-2/FLU/RSV  testing. Fact Sheet for Patients: PinkCheek.be Fact Sheet for Healthcare Providers: GravelBags.it This test is not yet approved or cleared by the [place] FDA and  has been authorized for detection and/or diagnosis of SARS-CoV-2 by  FDA under an Emergency Use Authorization (EUA). This EUA will remain  in effect (meaning this test can be used) for the duration of the  Covid-19 declaration under Section 564(b)(1) of the Act, 21  U.S.C. section 360bbb-3(b)(1), unless the authorization is  terminated or revoked. Performed at [facility], [address] [place], [place] [address]          Radiology Studies: CT Abdomen Pelvis Wo Contrast  Result Date: 01/24/2020 CLINICAL DATA:  86 year old female with abdominal pain, nausea vomiting. History of lymphoma. EXAM: CT ABDOMEN AND  PELVIS WITHOUT CONTRAST TECHNIQUE: Multidetector CT imaging of the abdomen and pelvis was performed following the standard protocol without IV contrast. COMPARISON:  CT Abdomen and Pelvis 07/12/2019. FINDINGS: Lower chest: Stable cardiomegaly. No pericardial effusion. Calcified aortic atherosclerosis. Mild lung base pleural thickening or less likely trace pleural fluid. Chronic paraseptal emphysema and mild lung base architectural distortion, but new left greater than right lower lobe and right middle lobe widespread pulmonary ground-glass opacity (series 3, image 6). Mild peribronchial nodularity associated. Lower lung airways appear to remain patent. Hepatobiliary: Negative noncontrast liver and gallbladder.  Mild motion artifact at the level of the gallbladder. Pancreas: Negative. Spleen: Negative. Adrenals/Urinary Tract: Adrenal glands are within normal limits. Bulky chronic right renal staghorn calculi. No acute right hydronephrosis or perinephric stranding. Stable appearance of the proximal right ureter which seems to taper distal to the renal pelvic staghorn calculus. Chronically abnormal left kidney with masslike soft tissue at the left renal hilum and bulky left renal lower pole 2 cm calculus. See series 2, image 31. This process appears stable since October. And there is associated chronic left periureteral stranding which abates at the pelvic inlet. The distal left ureter seems to remain normal. There are multiple chronic pelvic phleboliths. Stable and unremarkable urinary bladder. Stomach/Bowel: No dilated large or small bowel. There is severe diverticulosis of the descending colon, but no convincing active inflammation. There is motion artifact in the mid abdomen. The cecum appears to beyond a lax mesentery and much of the right colon is decompressed. Decompressed terminal ileum. The stomach is mildly to moderately fluid distended. Unremarkable duodenum. No free air, free fluid, or convincing mesenteric  stranding. Vascular/Lymphatic: Vascular patency is not evaluated in the absence of IV contrast. Extensive Aortoiliac calcified atherosclerosis. No lymphadenopathy identified in the absence of IV contrast. Reproductive: Surgically absent uterus and diminutive or absent ovaries. Other: No pelvic free fluid. Musculoskeletal: Osteopenia. No acute osseous abnormality identified. IMPRESSION: 1. Bilateral lung base ground-glass opacity is new since October and suspicious for acute viral/atypical respiratory infection. Pulmonary edema is felt less likely, there is chronic cardiomegaly. 2. No superimposed acute or inflammatory process identified in the noncontrast abdomen or pelvis: - stable since October abnormal left kidney with mass-like soft tissue at the left renal hilum. Etiology uncertain but perhaps due to lymphoma. - bulky superimposed bilateral nephrolithiasis, staghorn calculi on the right. - diverticulosis of the descending colon but no active inflammation. 3. Aortic Atherosclerosis (ICD10-I70.0). Electronically Signed   By: Genevie Ann M.D.   On: 01/24/2020 05:10   DG Chest Port 1 View  Result Date: 01/24/2020 CLINICAL DATA:  Abdominal pain. Episode of vomiting. EXAM: PORTABLE CHEST 1 VIEW COMPARISON:  01/07/2020, CT 07/12/2019 FINDINGS: Unchanged cardiomegaly. Aortic atherosclerosis and tortuosity. Development of patchy opacity in the right upper lung zone. Worsening opacities at the bases. No pneumothorax. Limited assessment for pleural effusion given habitus. IMPRESSION: 1. Development of patchy opacity in the right upper lung zone, may represent pneumonia. Asymmetric pulmonary edema is also considered. 2. Worsening nonspecific basilar opacities since radiographs earlier this month. 3. Stable cardiomegaly.  Aortic Atherosclerosis (ICD10-I70.0). Electronically Signed   By: Keith Rake M.D.   On: 01/24/2020 01:35        Scheduled Meds: . allopurinol  200 mg Oral Daily  . vitamin C  1,000 mg Oral  Daily  . aspirin EC  81 mg Oral Daily  . atorvastatin  20 mg Oral Daily  . cholecalciferol  1,000 Units Oral Daily  . dexamethasone (DECADRON) injection  6 mg Intravenous Q24H  . dextromethorphan-guaiFENesin  1 tablet Oral BID  . gabapentin  300 mg Oral TID  . metoprolol succinate  75 mg Oral BID  . pantoprazole (PROTONIX) IV  40 mg Intravenous Q24H  . Rivaroxaban  15 mg Oral Q supper  . zinc sulfate  220 mg Oral Daily   Continuous Infusions: . remdesivir 100 mg in NS 100 mL       LOS: 1 day    Time spent: 35 minutes    Sidney Ace, MD [facility] Pager [phone]  If 7PM-7AM, please contact night-coverage 01/25/2020, 12:00 PM

## 2020-01-26 DIAGNOSIS — I4891 Unspecified atrial fibrillation: Secondary | ICD-10-CM

## 2020-01-26 LAB — COMPREHENSIVE METABOLIC PANEL
ALT: 13 U/L (ref 0–44)
AST: 16 U/L (ref 15–41)
Albumin: 2.8 g/dL — ABNORMAL LOW (ref 3.5–5.0)
Alkaline Phosphatase: 51 U/L (ref 38–126)
Anion gap: 9 (ref 5–15)
BUN: 50 mg/dL — ABNORMAL HIGH (ref 8–23)
CO2: 23 mmol/L (ref 22–32)
Calcium: 8.6 mg/dL — ABNORMAL LOW (ref 8.9–10.3)
Chloride: 110 mmol/L (ref 98–111)
Creatinine, Ser: 2.12 mg/dL — ABNORMAL HIGH (ref 0.44–1.00)
GFR calc Af Amer: 23 mL/min — ABNORMAL LOW (ref >60)
GFR calc non Af Amer: 20 mL/min — ABNORMAL LOW (ref >60)
Glucose, Bld: 144 mg/dL — ABNORMAL HIGH (ref 70–99)
Potassium: 5.1 mmol/L (ref 3.5–5.1)
Sodium: 142 mmol/L (ref 135–145)
Total Bilirubin: 1 mg/dL (ref 0.3–1.2)
Total Protein: 6.3 g/dL — ABNORMAL LOW (ref 6.5–8.1)

## 2020-01-26 LAB — CBC WITH DIFFERENTIAL/PLATELET
Abs Immature Granulocytes: 0.32 10*3/uL — ABNORMAL HIGH (ref 0.00–0.07)
Basophils Absolute: 0 10*3/uL (ref 0.0–0.1)
Basophils Relative: 0 %
Eosinophils Absolute: 0 10*3/uL (ref 0.0–0.5)
Eosinophils Relative: 0 %
HCT: 40 % (ref 36.0–46.0)
Hemoglobin: 12.9 g/dL (ref 12.0–15.0)
Immature Granulocytes: 2 %
Lymphocytes Relative: 5 %
Lymphs Abs: 0.7 10*3/uL (ref 0.7–4.0)
MCH: 25.7 pg — ABNORMAL LOW (ref 26.0–34.0)
MCHC: 32.3 g/dL (ref 30.0–36.0)
MCV: 79.8 fL — ABNORMAL LOW (ref 80.0–100.0)
Monocytes Absolute: 0.7 10*3/uL (ref 0.1–1.0)
Monocytes Relative: 5 %
Neutro Abs: 12.7 10*3/uL — ABNORMAL HIGH (ref 1.7–7.7)
Neutrophils Relative %: 88 %
Platelets: 142 10*3/uL — ABNORMAL LOW (ref 150–400)
RBC: 5.01 MIL/uL (ref 3.87–5.11)
RDW: 17 % — ABNORMAL HIGH (ref 11.5–15.5)
Smear Review: NORMAL
WBC: 14.4 10*3/uL — ABNORMAL HIGH (ref 4.0–10.5)
nRBC: 0 % (ref 0.0–0.2)

## 2020-01-26 LAB — TROPONIN I (HIGH SENSITIVITY): Troponin I (High Sensitivity): 261 ng/L (ref <18)

## 2020-01-26 LAB — MAGNESIUM: Magnesium: 2.3 mg/dL (ref 1.7–2.4)

## 2020-01-26 LAB — FERRITIN: Ferritin: 178 ng/mL (ref 11–307)

## 2020-01-26 LAB — C-REACTIVE PROTEIN: CRP: 9 mg/dL — ABNORMAL HIGH (ref <1.0)

## 2020-01-26 LAB — FIBRIN DERIVATIVES D-DIMER (ARMC ONLY): Fibrin derivatives D-dimer (ARMC): 3287.73 ng/mL (FEU) — ABNORMAL HIGH (ref 0.00–499.00)

## 2020-01-26 MED ORDER — SODIUM CHLORIDE 0.9 % IV BOLUS
500.0000 mL | Freq: Once | INTRAVENOUS | Status: AC
  Administered 2020-01-26: 500 mL via INTRAVENOUS

## 2020-01-26 NOTE — Progress Notes (Signed)
OT Cancellation Note  Patient Details Name: Kristy Solomon MRN: [id number] DOB: Dec 06, 1928   Cancelled Treatment:    Reason Eval/Treat Not Completed: OT screened, no needs identified, will sign off  OT consult received and chart reviewed. Upon chart review, Pt noted to be performing all tasks at MOD I to Indep level at this time. Will complete order and sign off. Thank you.    Gerrianne Scale, [place], OTR/L ascom [phone] 01/26/20, 11:52 AM

## 2020-01-26 NOTE — Progress Notes (Signed)
PROGRESS NOTE    Kristy Solomon  QPR:[id number] DOB: [birth year] DOA: 01/24/2020 PCP: Jodi Marble, MD   Brief Narrative: 86 year old female multiple medical comorbidities who presents for evaluation of abdominal pain.  She is found to be Covid positive.  She is not requiring any oxygen.  She does have evidence of infiltrate on chest x-ray concerning for viral syndrome.  Started on IV remdesivir and IV steroids.  Also has atrial fibrillation with rapid ventricular response.  Presumably infectious driven.  Is on metoprolol.  Added Cardizem IV for rate control as needed.  Patient will be admitted to progressive cardiac floor with airborne and contact isolation precautions.  I have called and updated patient's daughter [name] via phone.  All questions answered.  I have also called in consultation with palliative care.  Cardiology also on board.  4/24: Patient seen and examined.  Does not believe that she has Covid.  Despite my explanations, she is insistent.  Seen by cardiology.  No change in management from their standpoint.  Patient continues on IV remdesivir and IV steroids.  Remains on room air.  Discussed with palliative care, they were unable to see patient yesterday due to large amount of consults.  We will follow up on Monday.  4/25: Patient seen and examined.  Continues on room air.  Continues on IV remdesivir and steroids.  Seen by physical therapy yesterday.  No PT follow-up recommended.  Discussed with daughter yesterday.  Daughter is concerned the patient is unable to care for herself at home.  Discussed with TOC member.  This may present disposition challenges.  Kidney function worsening over interval.  Diet advanced.   Assessment & Plan:   Active Problems:   Pneumonia due to COVID-19 virus  Pneumonia secondary to COVID-19 virus Patient has had many possible home exposures Continues to think that she does not have Covid despite my explanation She has radiographic evidence  of pneumonia on chest x-ray Currently not requiring any oxygen Plan: Continue remdesivir, day 3 of 5 Continue IV steroids, day 3 of 10 Supplemental oxygen as needed Mobilize  AKI on CKD Baseline creatinine appears to be around 1.5-1.7 Creatinine rising over interval Suspect prerenal azotemia Gentle IV fluids, 500 cc normal saline at 250 cc an hour Recheck creatinine in the morning We will consider nephrology consult if creatinine not improving  Paroxysmal atrial fibrillation Continue metoprolol Continue Xarelto Outpatient cardiology consideration for cardioversion  Abdominal pain CT with no acute findings Improved over interval As needed antiemetics  Hyperlipidemia Continue statin  Elevated troponin Likely demand ischemia No chest pain or ischemic EKG changes Likely related Covid Observe on telemetry  History of gout No acute flare Continue allopurinol  Peripheral neuropathy Continue Neurontin  Malignant lymphoma Currently under treatment with ibrutinib Currently on hold considering acute illness Outpatient follow-up with oncology  DVT prophylaxis: Xarelto Code Status: Full code Family Communication:  Disposition Plan: Status is: Inpatient  Status is: Inpatient  Remains inpatient appropriate because:Inpatient level of care appropriate due to severity of illness   Dispo: The patient is from: Home              Anticipated d/c is to: Home              Anticipated d/c date is: 2 days              Patient currently is not medically stable to d/c.   Patient has worsening kidney function over interval in the setting of acute Covid infection.  Also may be some disposition issues.  Currently patient does not meet criteria for skilled nursing facility placement however patient's daughter is concerned that patient may not be able to care for herself at home.  I requested TOC team member to reach out to the daughter and explained.  I have discussed this with the  daughter on 01/25/2020           Consultants:   Cardiology  Palliative care  Procedures:   none  Antimicrobials:   Remdesivir    Subjective: Seen and examined No complaints this morning Feels well, diet advanced  Objective: Vitals:   01/25/20 1600 01/26/20 0000 01/26/20 0048 01/26/20 0825  BP: 137/88 (!) 117/92  (!) 149/90  Pulse:  97  92  Resp:  20  18  Temp: 98.2 F (36.8 C) 97.8 F (36.6 C)  (!) 97.5 F (36.4 C)  TempSrc:  Oral  Oral  SpO2: 95% (!) 83% 96% 100%  Weight:      Height:        Intake/Output Summary (Last 24 hours) at 01/26/2020 1054 Last data filed at 01/25/2020 1600 Gross per 24 hour  Intake 100 ml  Output 300 ml  Net -200 ml   Filed Weights   01/24/20 0059  Weight: 99.8 kg    Examination:  General exam: Frail-appearing elderly female Respiratory system: Bibasilar crackles, normal work of breathing Cardiovascular system: Regular rate, irregular rhythm, no murmurs, no pedal edema  gastrointestinal system: Nontender, nondistended, normal bowel sounds Central nervous system: Alert and oriented. No focal neurological deficits. Extremities: Symmetric 5 x 5 power. Skin: No rashes, lesions or ulcers Psychiatry: Judgement and insight appear normal. Mood & affect appropriate.     Data Reviewed: I have personally reviewed following labs and imaging studies  CBC: Recent Labs  Lab 01/24/20 0106 01/25/20 0723 01/26/20 0621  WBC 5.1 12.3* 14.4*  NEUTROABS 3.8 10.5* 12.7*  HGB 13.8 13.4 12.9  HCT 45.2 42.0 40.0  MCV 82.6 80.8 79.8*  PLT 150 141* 086*   Basic Metabolic Panel: Recent Labs  Lab 01/24/20 0106 01/25/20 0723 01/26/20 0621  NA 140 142 142  K 4.0 4.2 5.1  CL 108 108 110  CO2 21* 22 23  GLUCOSE 176* 118* 144*  BUN 31* 36* 50*  CREATININE 1.44* 1.71* 2.12*  CALCIUM 8.6* 8.5* 8.6*  MG  --  2.2 2.3   GFR: Estimated Creatinine Clearance: 21.1 mL/min (A) (by C-G formula based on SCr of 2.12 mg/dL (H)). Liver  Function Tests: Recent Labs  Lab 01/24/20 0106 01/25/20 0723 01/26/20 0621  AST 23 18 16   ALT 15 12 13   ALKPHOS 52 54 51  BILITOT 1.5* 0.9 1.0  PROT 6.4* 6.6 6.3*  ALBUMIN 2.9* 3.0* 2.8*   Recent Labs  Lab 01/24/20 0106  LIPASE 37   No results for input(s): AMMONIA in the last 168 hours. Coagulation Profile: No results for input(s): INR, PROTIME in the last 168 hours. Cardiac Enzymes: No results for input(s): CKTOTAL, CKMB, CKMBINDEX, TROPONINI in the last 168 hours. BNP (last 3 results) No results for input(s): PROBNP in the last 8760 hours. HbA1C: No results for input(s): HGBA1C in the last 72 hours. CBG: No results for input(s): GLUCAP in the last 168 hours. Lipid Profile: No results for input(s): CHOL, HDL, LDLCALC, TRIG, CHOLHDL, LDLDIRECT in the last 72 hours. Thyroid Function Tests: No results for input(s): TSH, T4TOTAL, FREET4, T3FREE, THYROIDAB in the last 72 hours. Anemia Panel: Recent Labs  01/25/20 0723 01/26/20 0621  FERRITIN 216 178   Sepsis Labs: Recent Labs  Lab 01/24/20 0106 01/24/20 0300  PROCALCITON <0.10  --   LATICACIDVEN 2.2* 1.9    Recent Results (from the past 240 hour(s))  Urine culture     Status: None   Collection Time: 01/24/20  1:06 AM   Specimen: Urine, Random  Result Value Ref Range Status   Specimen Description   Final    URINE, RANDOM Performed at [facility], [address], [place], [place] [address]    Special Requests   Final    NONE Performed at [facility], [address] [place], [place] [address]    Culture   Final    NO GROWTH Performed at [facility], [place] [address] [place], [place] [address]    Report Status 01/25/2020 FINAL  Final  Culture, blood (routine x 2)     Status: None (Preliminary result)   Collection Time: 01/24/20  1:07 AM   Specimen: BLOOD  Result Value Ref Range Status   Specimen Description BLOOD RAC  Final   Special Requests BOTTLES DRAWN AEROBIC AND  ANAEROBIC BCAV  Final   Culture   Final    NO GROWTH 2 DAYS Performed at [facility], [address], [place], [place] [address]    Report Status PENDING  Incomplete  Culture, blood (routine x 2)     Status: None (Preliminary result)   Collection Time: 01/24/20  1:07 AM   Specimen: BLOOD  Result Value Ref Range Status   Specimen Description BLOOD LEFT HAND  Final   Special Requests BOTTLES DRAWN AEROBIC AND ANAEROBIC BCAV  Final   Culture   Final    NO GROWTH 2 DAYS Performed at [facility], [address], [place], [place] [address]    Report Status PENDING  Incomplete  Respiratory Panel by RT PCR (Flu A&B, Covid) - Nasopharyngeal Swab     Status: Abnormal   Collection Time: 01/24/20  1:11 AM   Specimen: Nasopharyngeal Swab  Result Value Ref Range Status   SARS Coronavirus 2 by RT PCR POSITIVE (A) NEGATIVE Final    Comment: RESULT CALLED TO, READ BACK BY AND VERIFIED WITHBarbette Merino RN [phone] 4/32/21 HNM (NOTE) SARS-CoV-2 target nucleic acids are DETECTED. SARS-CoV-2 RNA is generally detectable in upper respiratory specimens  during the acute phase of infection. Positive results are indicative of the presence of the identified virus, but do not rule out bacterial infection or co-infection with other pathogens not detected by the test. Clinical correlation with patient history and other diagnostic information is necessary to determine patient infection status. The expected result is Negative. Fact Sheet for Patients:  PinkCheek.be Fact Sheet for Healthcare Providers: GravelBags.it This test is not yet approved or cleared by the [place] FDA and  has been authorized for detection and/or diagnosis of SARS-CoV-2 by FDA under an Emergency Use Authorization (EUA).  This EUA will remain in effect (meaning this test can be used) for t he duration of  the COVID-19 declaration under Section 564(b)(1)  of the Act, 21 U.S.C. section 360bbb-3(b)(1), unless the authorization is terminated or revoked sooner.    Influenza A by PCR NEGATIVE NEGATIVE Final   Influenza B by PCR NEGATIVE NEGATIVE Final    Comment: (NOTE) The Xpert Xpress SARS-CoV-2/FLU/RSV assay is intended as an aid in  the diagnosis of influenza from Nasopharyngeal swab specimens and  should not be used as a sole  basis for treatment. Nasal washings and  aspirates are unacceptable for Xpert Xpress SARS-CoV-2/FLU/RSV  testing. Fact Sheet for Patients: PinkCheek.be Fact Sheet for Healthcare Providers: GravelBags.it This test is not yet approved or cleared by the [place] FDA and  has been authorized for detection and/or diagnosis of SARS-CoV-2 by  FDA under an Emergency Use Authorization (EUA). This EUA will remain  in effect (meaning this test can be used) for the duration of the  Covid-19 declaration under Section 564(b)(1) of the Act, 21  U.S.C. section 360bbb-3(b)(1), unless the authorization is  terminated or revoked. Performed at [facility], [address], [place], [place] [address]          Radiology Studies: No results found.      Scheduled Meds: . allopurinol  200 mg Oral Daily  . vitamin C  1,000 mg Oral Daily  . aspirin EC  81 mg Oral Daily  . atorvastatin  20 mg Oral Daily  . cholecalciferol  1,000 Units Oral Daily  . dexamethasone (DECADRON) injection  6 mg Intravenous Q24H  . dextromethorphan-guaiFENesin  1 tablet Oral BID  . gabapentin  300 mg Oral TID  . metoprolol succinate  75 mg Oral BID  . pantoprazole (PROTONIX) IV  40 mg Intravenous Q24H  . Rivaroxaban  15 mg Oral Q supper  . zinc sulfate  220 mg Oral Daily   Continuous Infusions: . remdesivir 100 mg in NS 100 mL 100 mg (01/26/20 0838)     LOS: 2 days    Time spent: 35 minutes    Sidney Ace, MD [facility] Pager [phone]  If  7PM-7AM, please contact night-coverage 01/26/2020, 10:54 AM

## 2020-01-26 NOTE — Plan of Care (Signed)
No acute events over night. Patient has remained alert and oriented throughout the night. She is maintaining her oxygenation on room air. Notified by NT that patient's oxygenation was at 83% on room air. Patient assessed by this RN and noted to be in no apparent distress. No signs or symptoms of shortness of breath or dyspnea while at rest. Breath sounds were clear but diminished. Her oxygen saturation was at 96% on room air. She has been compliant with medication therapies. She complained of abdominal pain and received one dose of Tylenol which was effective. Also, received trazodone for sleep. Medication effective. She has been compliant with calling out for assistance to bedside commode. She remains free of falls and injuries. Her daughter, [name], was provided with an update. Awaiting palliative care consult and possible SNF placement. Plan of care continued.    Problem: Education: Goal: Knowledge of risk factors and measures for prevention of condition will improve Outcome: Progressing   Problem: Coping: Goal: Psychosocial and spiritual needs will be supported Outcome: Progressing   Problem: Respiratory: Goal: Will maintain a patent airway Outcome: Progressing Goal: Complications related to the disease process, condition or treatment will be avoided or minimized Outcome: Progressing   Problem: Education: Goal: Knowledge of General Education information will improve Description: Including pain rating scale, medication(s)/side effects and non-pharmacologic comfort measures Outcome: Progressing   Problem: Health Behavior/Discharge Planning: Goal: Ability to manage health-related needs will improve Outcome: Progressing   Problem: Elimination: Goal: Will not experience complications related to bowel motility Outcome: Progressing Goal: Will not experience complications related to urinary retention Outcome: Progressing   Problem: Pain Managment: Goal: General experience of comfort will  improve Outcome: Progressing   Problem: Safety: Goal: Ability to remain free from injury will improve Outcome: Progressing

## 2020-01-26 NOTE — Progress Notes (Signed)
Dr. Priscella Mann placed order for 568ml fluid bolus as patient's creatinine is elevated. Confirmed this with MD as patient has history of CHF. MD confirms he would like her to have 500 ml bolus, but to run it at 255mls/hr. Completed. No s/s of fluid overload. Will continue to monitor.

## 2020-01-26 NOTE — Progress Notes (Signed)
Alerted MD that patient's BP is 161/103. Hydralazine PRN orders are for SBP>165 and DBP>110, this BP does not meet parameters. Dr. Priscella Mann confirmed that this BP is okay for now and pt will get metoprolol this evening- no PRN hydralazine needed. Also alerted Dr. Priscella Mann that patient has been very fatigued today, sleeping most of the day. Patient is easy to arouse and will interact when awoken, but quickly goes back to sleep. MD acknowledges, no new orders. Also alerted Md that nursing staff is having a difficult time getting oxygen saturation reading, finally got it on patient's right ear, 100% on room air with a good pleth wave.

## 2020-01-26 NOTE — Progress Notes (Signed)
Primary Consult:  Kristy Solomon   Subjective:  No cardiac complaints not clear she can care for herself at home Still implies she does  Not have COVID Children in/out of her house a lot   Objective:  Vitals:   01/25/20 1600 01/26/20 0000 01/26/20 0048 01/26/20 0825  BP: 137/88 (!) 117/92  (!) 149/90  Pulse:  97  92  Resp:  20  18  Temp: 98.2 F (36.8 C) 97.8 F (36.6 C)  (!) 97.5 F (36.4 C)  TempSrc:  Oral  Oral  SpO2: 95% (!) 83% 96% 100%  Weight:      Height:        Intake/Output from previous day:  Intake/Output Summary (Last 24 hours) at 01/26/2020 1141 Last data filed at 01/26/2020 1100 Gross per 24 hour  Intake 504.13 ml  Output 300 ml  Net 204.13 ml    Physical Exam: Affect appropriate Chronically ill elderly black female  HEENT: normal Neck supple with no adenopathy JVP normal no bruits no thyromegaly Lungs clear with no wheezing and good diaphragmatic motion Heart:  S1/S2 no murmur, no rub, gallop or click PMI normal Abdomen: benighn, BS positve, no tenderness, no AAA no bruit.  No HSM or HJR Distal pulses intact with no bruits No edema Neuro non-focal Skin warm and dry No muscular weakness   Lab Results: Basic Metabolic Panel: Recent Labs    01/25/20 0723 01/26/20 0621  NA 142 142  K 4.2 5.1  CL 108 110  CO2 22 23  GLUCOSE 118* 144*  BUN 36* 50*  CREATININE 1.71* 2.12*  CALCIUM 8.5* 8.6*  MG 2.2 2.3   Liver Function Tests: Recent Labs    01/25/20 0723 01/26/20 0621  AST 18 16  ALT 12 13  ALKPHOS 54 51  BILITOT 0.9 1.0  PROT 6.6 6.3*  ALBUMIN 3.0* 2.8*   Recent Labs    01/24/20 0106  LIPASE 37   CBC: Recent Labs    01/25/20 0723 01/26/20 0621  WBC 12.3* 14.4*  NEUTROABS 10.5* 12.7*  HGB 13.4 12.9  HCT 42.0 40.0  MCV 80.8 79.8*  PLT 141* 142*    Anemia Panel: Recent Labs    01/26/20 0621  FERRITIN 178    Imaging: No results found.  Cardiac Studies:  ECG: afib PVC LAD no acute changes   Telemetry: afib  rates ok 70-90 bpm  Echo: 12/31/19 EF 50-55% mild MR mild bi atrial enlargement   Medications:   . allopurinol  200 mg Oral Daily  . vitamin C  1,000 mg Oral Daily  . aspirin EC  81 mg Oral Daily  . atorvastatin  20 mg Oral Daily  . cholecalciferol  1,000 Units Oral Daily  . dexamethasone (DECADRON) injection  6 mg Intravenous Q24H  . dextromethorphan-guaiFENesin  1 tablet Oral BID  . gabapentin  300 mg Oral TID  . metoprolol succinate  75 mg Oral BID  . pantoprazole (PROTONIX) IV  40 mg Intravenous Q24H  . Rivaroxaban  15 mg Oral Q supper  . zinc sulfate  220 mg Oral Daily     . remdesivir 100 mg in NS 100 mL Stopped (01/26/20 0909)    Assessment/Plan:   1. PAF:  Since January continue xarelto and metroprolol rates ok Can consider outpatient [facility] after she recovers from COVID see previous notes per Dr Rockey Situ   2. COVID:  Rx with steroids and remdesivir with significant RUL and bibasilar patchy infiltrates sats 94%   3. Abdominal Pain:  Improved CT abdomen with no acute findings   4. HLD :  On statin   5. Elevated Troponin:  No chest pain or acute ECG changes likely related to COVID observe   Kristy Solomon 01/26/2020, 11:41 AM

## 2020-01-26 NOTE — TOC Initial Note (Signed)
Transition of Care [facility]) - Initial/Assessment Note    Patient Details  Name: Kristy Solomon MRN: [id number] Date of Birth: 08-11-1929  [facility] CM/SW Contact:    Boris Sharper, LCSW Phone Number: 01/26/2020, 2:44 PM  Clinical Narrative:                 CSW contacted pt's daughter to discuss pt's discharge plan. Daughter was difficult to communicate with, She wants pt to go to SNF but there have been no PT or OT recommending SNF. I explained to the daughter that there were no PT follow ups recommended. Daughter is adamant that pt cannot take care of herself at home alone. Daughter stated that pt is going to fight it and not want to go to SNF and asked could we tell her that she has to go. I explained that we cannot force her to go anywhere she does not want to go and that it would have to be a family decision.   CSW notified MD of daughter's concerns and he stated that the pt id not stable for discharge today and he consulted palliative care and hopefully they can interject their option tomorrow.  TOC will continue to follow for discharge planning needs        Patient Goals and CMS Choice        Expected Discharge Plan and Services                                                Prior Living Arrangements/Services                       Activities of Daily Living Home Assistive Devices/Equipment: Eyeglasses, Shower chair without back, Environmental consultant (specify type) ADL Screening (condition at time of admission) Patient's cognitive ability adequate to safely complete daily activities?: Yes Is the patient deaf or have difficulty hearing?: No Does the patient have difficulty seeing, even when wearing glasses/contacts?: No Does the patient have difficulty concentrating, remembering, or making decisions?: No Patient able to express need for assistance with ADLs?: Yes Does the patient have difficulty dressing or bathing?: No Independently performs  ADLs?: Yes (appropriate for developmental age) Does the patient have difficulty walking or climbing stairs?: No Weakness of Legs: None Weakness of Arms/Hands: None  Permission Sought/Granted                  Emotional Assessment              Admission diagnosis:  Generalized abdominal pain [R10.84] Elevated troponin [R77.8] Atrial fibrillation with rapid ventricular response ([place]) [I48.91] Hypothermia, initial encounter [T68.XXXA] Non-intractable vomiting with nausea, unspecified vomiting type [R11.2] Pneumonia due to COVID-19 virus [U07.1, J12.82] Patient Active Problem List   Diagnosis Date Noted  . Atrial fibrillation with rapid ventricular response ([place])   . Pneumonia due to COVID-19 virus 01/24/2020  . Renal mass, left 01/13/2020  . Persistent atrial fibrillation ([place])   . Chronic kidney disease, stage 3b   . Acute on chronic heart failure with preserved ejection fraction (HFpEF) ([place]) 01/06/2020  . Acute decompensated heart failure ([place]) 01/03/2020  . Acute on chronic diastolic CHF (congestive heart failure) ([place]) 12/31/2019  . CKD (chronic kidney disease), stage IV ([place]) 12/31/2019  . Atrial fibrillation, chronic ([place]) 12/31/2019  . Elevated troponin 12/31/2019  . Renal insufficiency  07/28/2019  . Bilateral edema of lower extremity 05/07/2019  . HLD (hyperlipidemia) 09/18/2018  . HTN (hypertension) 09/18/2018  . GERD (gastroesophageal reflux disease) 09/18/2018  . Back pain 09/18/2018  . Bradycardia 09/18/2018  . Bigeminy 09/18/2018  . Nephrolithiasis 01/11/2018  . Goals of care, counseling/discussion 12/28/2017  . Gastroenteritis 12/28/2017  . B12 deficiency 03/18/2016  . Folate deficiency 03/18/2016  . Iron deficiency anemia 03/18/2016  . Pneumonia 03/17/2016  . Pressure ulcer 03/17/2016  . Thrombocytopenia ([place]) 07/04/2014  . Acute renal failure ([place]) 07/04/2014  . Hyperuricemia 07/04/2014  . 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 Waldenstrom  macroglobulinemia ([place]) 12/27/2013  . Calculi, ureter 07/03/2012  . Frank hematuria 07/03/2012  . Hydronephrosis 07/03/2012  . 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 Calculus of kidney 07/02/2012  . Nonspecific finding on examination of urine 07/02/2012   PCP:  Jodi Marble, MD Pharmacy:   [facility] [place], [place] - [place] [facility] [address] [place] [address] Phone: [phone] Fax: [place], [place] - [place] [address] Phone: [phone] Fax: [phone]     Social Determinants of Health (SDOH) Interventions    Readmission Risk Interventions No flowsheet data found.

## 2020-01-26 NOTE — Progress Notes (Signed)
Alerted Dr. Priscella Mann that patient's D-dimer and trops are elevated. Also alerted MD that patient is requesting solid food. Will continue to monitor.

## 2020-01-27 DIAGNOSIS — Z515 Encounter for palliative care: Secondary | ICD-10-CM

## 2020-01-27 DIAGNOSIS — Z7189 Other specified counseling: Secondary | ICD-10-CM

## 2020-01-27 LAB — COMPREHENSIVE METABOLIC PANEL
ALT: 12 U/L (ref 0–44)
AST: 11 U/L — ABNORMAL LOW (ref 15–41)
Albumin: 2.5 g/dL — ABNORMAL LOW (ref 3.5–5.0)
Alkaline Phosphatase: 46 U/L (ref 38–126)
Anion gap: 7 (ref 5–15)
BUN: 62 mg/dL — ABNORMAL HIGH (ref 8–23)
CO2: 21 mmol/L — ABNORMAL LOW (ref 22–32)
Calcium: 8 mg/dL — ABNORMAL LOW (ref 8.9–10.3)
Chloride: 112 mmol/L — ABNORMAL HIGH (ref 98–111)
Creatinine, Ser: 2.07 mg/dL — ABNORMAL HIGH (ref 0.44–1.00)
GFR calc Af Amer: 24 mL/min — ABNORMAL LOW (ref >60)
GFR calc non Af Amer: 21 mL/min — ABNORMAL LOW (ref >60)
Glucose, Bld: 137 mg/dL — ABNORMAL HIGH (ref 70–99)
Potassium: 4.4 mmol/L (ref 3.5–5.1)
Sodium: 140 mmol/L (ref 135–145)
Total Bilirubin: 1 mg/dL (ref 0.3–1.2)
Total Protein: 5.7 g/dL — ABNORMAL LOW (ref 6.5–8.1)

## 2020-01-27 LAB — CBC WITH DIFFERENTIAL/PLATELET
Abs Immature Granulocytes: 0.26 10*3/uL — ABNORMAL HIGH (ref 0.00–0.07)
Basophils Absolute: 0 10*3/uL (ref 0.0–0.1)
Basophils Relative: 0 %
Eosinophils Absolute: 0 10*3/uL (ref 0.0–0.5)
Eosinophils Relative: 0 %
HCT: 39 % (ref 36.0–46.0)
Hemoglobin: 12.2 g/dL (ref 12.0–15.0)
Immature Granulocytes: 2 %
Lymphocytes Relative: 5 %
Lymphs Abs: 0.7 10*3/uL (ref 0.7–4.0)
MCH: 25.5 pg — ABNORMAL LOW (ref 26.0–34.0)
MCHC: 31.3 g/dL (ref 30.0–36.0)
MCV: 81.4 fL (ref 80.0–100.0)
Monocytes Absolute: 0.9 10*3/uL (ref 0.1–1.0)
Monocytes Relative: 7 %
Neutro Abs: 10.8 10*3/uL — ABNORMAL HIGH (ref 1.7–7.7)
Neutrophils Relative %: 86 %
Platelets: 122 10*3/uL — ABNORMAL LOW (ref 150–400)
RBC: 4.79 MIL/uL (ref 3.87–5.11)
RDW: 17.1 % — ABNORMAL HIGH (ref 11.5–15.5)
WBC: 12.7 10*3/uL — ABNORMAL HIGH (ref 4.0–10.5)
nRBC: 0.5 % — ABNORMAL HIGH (ref 0.0–0.2)

## 2020-01-27 LAB — C-REACTIVE PROTEIN: CRP: 4.8 mg/dL — ABNORMAL HIGH (ref <1.0)

## 2020-01-27 LAB — MAGNESIUM: Magnesium: 2.3 mg/dL (ref 1.7–2.4)

## 2020-01-27 LAB — FIBRIN DERIVATIVES D-DIMER (ARMC ONLY): Fibrin derivatives D-dimer (ARMC): 1964.27 ng/mL (FEU) — ABNORMAL HIGH (ref 0.00–499.00)

## 2020-01-27 LAB — FERRITIN: Ferritin: 169 ng/mL (ref 11–307)

## 2020-01-27 MED ORDER — PANTOPRAZOLE SODIUM 40 MG PO TBEC
40.0000 mg | DELAYED_RELEASE_TABLET | Freq: Every day | ORAL | Status: DC
  Administered 2020-01-27 – 2020-01-29 (×3): 40 mg via ORAL
  Filled 2020-01-27 (×3): qty 1

## 2020-01-27 MED ORDER — SODIUM CHLORIDE 0.9 % IV BOLUS
500.0000 mL | Freq: Once | INTRAVENOUS | Status: AC
  Administered 2020-01-27: 500 mL via INTRAVENOUS

## 2020-01-27 MED FILL — IMBRUVICA 420 MG TAB: 420 | 28 days supply | Qty: 28 | Fill #1

## 2020-01-27 NOTE — Progress Notes (Signed)
Went to place DNR wristband on patient as an order was placed for patient's code status to be DNR. Asked patient's permission to place wristband and she said no. RN explained what a DNR code status entails and patient stated "I do not want to be a DNR." Confirmed this with the patient. Called patient's daughter, [name], who also confirmed that she believes her mother would prefer to be a full code. Alerted Dr. Priscella Mann and [name], NP. Palliative NP not still in house, so Dr. Priscella Mann changed code status to full code. Will continue to monitor patient. All safety measures are in place.

## 2020-01-27 NOTE — NC FL2 (Signed)
[place] LEVEL OF CARE SCREENING TOOL     IDENTIFICATION  Patient Name: Kristy Solomon Birthdate: 06-12-1929 Sex: female Admission Date (Current Location): 01/24/2020  [place] and [place] Number:  Engineering geologist and Address:  [facility], [address], [place], [place] [address]      Provider Number: [id number]  Attending Physician Name and Address:  Sidney Ace, MD  Relative Name and Phone Number:  [name] [phone]    Current Level of Care: Hospital Recommended Level of Care: [place] Prior Approval Number:    Date Approved/Denied:   PASRR Number: [id number]  Discharge Plan: SNF    Current Diagnoses: Patient Active Problem List   Diagnosis Date Noted  . Palliative care by specialist   . Atrial fibrillation with rapid ventricular response ([place])   . Pneumonia due to COVID-19 virus 01/24/2020  . Renal mass, left 01/13/2020  . Persistent atrial fibrillation ([place])   . Chronic kidney disease, stage 3b   . Acute on chronic heart failure with preserved ejection fraction (HFpEF) ([place]) 01/06/2020  . Acute decompensated heart failure ([place]) 01/03/2020  . Acute on chronic diastolic CHF (congestive heart failure) ([place]) 12/31/2019  . CKD (chronic kidney disease), stage IV ([place]) 12/31/2019  . Atrial fibrillation, chronic ([place]) 12/31/2019  . Elevated troponin 12/31/2019  . Renal insufficiency 07/28/2019  . Bilateral edema of lower extremity 05/07/2019  . HLD (hyperlipidemia) 09/18/2018  . HTN (hypertension) 09/18/2018  . GERD (gastroesophageal reflux disease) 09/18/2018  . Back pain 09/18/2018  . Bradycardia 09/18/2018  . Bigeminy 09/18/2018  . Nephrolithiasis 01/11/2018  . Goals of care, counseling/discussion 12/28/2017  . Gastroenteritis 12/28/2017  . B12 deficiency 03/18/2016  . Folate deficiency 03/18/2016  . Iron deficiency anemia 03/18/2016  . Pneumonia 03/17/2016  . Pressure ulcer 03/17/2016   . Thrombocytopenia ([place]) 07/04/2014  . Acute renal failure ([place]) 07/04/2014  . Hyperuricemia 07/04/2014  . 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 Waldenstrom macroglobulinemia ([place]) 12/27/2013  . Calculi, ureter 07/03/2012  . Frank hematuria 07/03/2012  . Hydronephrosis 07/03/2012  . 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 Calculus of kidney 07/02/2012  . Nonspecific finding on examination of urine 07/02/2012    Orientation RESPIRATION BLADDER Height & Weight     Self, Time, Situation, Place  Normal Continent Weight: 99.8 kg Height:  5\' 4"  (162.6 cm)  BEHAVIORAL SYMPTOMS/MOOD NEUROLOGICAL BOWEL NUTRITION STATUS      Continent Diet  AMBULATORY STATUS COMMUNICATION OF NEEDS Skin   Supervision Verbally Normal                       Personal Care Assistance Level of Assistance  Bathing, Feeding, Dressing Bathing Assistance: Limited assistance Feeding assistance: Limited assistance Dressing Assistance: Limited assistance     Functional Limitations Info             SPECIAL CARE FACTORS FREQUENCY  PT (By licensed PT), OT (By licensed OT)     PT Frequency: 2-3 times per week OT Frequency: 2-3 times per week            Contractures Contractures Info: Not present    Additional Factors Info  Isolation Precautions, Code Status, Allergies Code Status Info: DNR Allergies Info: Eliquis     Isolation Precautions Info: Airborne- COVID     Current Medications (01/27/2020):  This is the  current hospital active medication list Current Facility-Administered Medications  Medication Dose Route Frequency Provider Last Rate Last Admin  . acetaminophen (TYLENOL) tablet 650 mg  650 mg Oral Q6H PRN Mansy, Jan A, MD   650 mg at 01/27/20 0330  . albuterol (VENTOLIN HFA) 108 (90 Base) MCG/ACT inhaler 2 puff  2 puff Inhalation Q6H  PRN Mansy, Jan A, MD      . allopurinol (ZYLOPRIM) tablet 200 mg  200 mg Oral Daily Mansy, Jan A, MD   200 mg at 01/27/20 1012  . ascorbic acid (VITAMIN C) tablet 1,000 mg  1,000 mg Oral Daily Mansy, Jan A, MD   1,000 mg at 01/27/20 1012  . aspirin EC tablet 81 mg  81 mg Oral Daily Mansy, Jan A, MD   81 mg at 01/27/20 1012  . atorvastatin (LIPITOR) tablet 20 mg  20 mg Oral Daily Mansy, Jan A, MD   20 mg at 01/26/20 1703  . chlorpheniramine-HYDROcodone (TUSSIONEX) 10-8 MG/5ML suspension 5 mL  5 mL Oral Q12H PRN Mansy, Jan A, MD      . cholecalciferol (VITAMIN D3) tablet 1,000 Units  1,000 Units Oral Daily Ralene Muskrat B, MD   1,000 Units at 01/27/20 1012  . dextromethorphan-guaiFENesin (MUCINEX DM) 30-600 MG per 12 hr tablet 1 tablet  1 tablet Oral BID Mansy, Jan A, MD   1 tablet at 01/27/20 1012  . diltiazem (CARDIZEM) injection 10 mg  10 mg Intravenous Q4H PRN Ralene Muskrat B, MD   10 mg at 01/24/20 1138  . gabapentin (NEURONTIN) capsule 300 mg  300 mg Oral TID Mansy, Jan A, MD   300 mg at 01/27/20 1012  . guaiFENesin-dextromethorphan (ROBITUSSIN DM) 100-10 MG/5ML syrup 10 mL  10 mL Oral Q4H PRN Mansy, Jan A, MD      . hydrALAZINE (APRESOLINE) injection 10 mg  10 mg Intravenous Q4H PRN Ralene Muskrat B, MD   10 mg at 01/24/20 1629  . magnesium hydroxide (MILK OF MAGNESIA) suspension 30 mL  30 mL Oral Daily PRN Mansy, Jan A, MD      . metoprolol succinate (TOPROL-XL) 24 hr tablet 75 mg  75 mg Oral BID Mansy, Jan A, MD   75 mg at 01/27/20 1012  . pantoprazole (PROTONIX) EC tablet 40 mg  40 mg Oral Daily Pernell Dupre, RPH   40 mg at 01/27/20 1012  . remdesivir 100 mg in sodium chloride 0.9 % 100 mL IVPB  100 mg Intravenous Daily Paulette Blanch, MD 200 mL/hr at 01/27/20 1023 100 mg at 01/27/20 1023  . Rivaroxaban (XARELTO) tablet 15 mg  15 mg Oral Q supper Mansy, Jan A, MD   15 mg at 01/26/20 1703  . traZODone (DESYREL) tablet 25 mg  25 mg Oral QHS PRN Mansy, Jan A, MD   25 mg at  01/25/20 2128  . zinc sulfate capsule 220 mg  220 mg Oral Daily Ralene Muskrat B, MD   220 mg at 01/27/20 1012   Facility-Administered Medications Ordered in Other Encounters  Medication Dose Route Frequency Provider Last Rate Last Admin  . cyanocobalamin ((VITAMIN B-12)) injection 1,000 mcg  1,000 mcg Intramuscular Once Lequita Asal, MD         Discharge Medications: Please see discharge summary for a list of discharge medications.  Relevant Imaging Results:  Relevant Lab Results:   Additional Information SS# [id number]  Shelbie Hutching, RN

## 2020-01-27 NOTE — Progress Notes (Signed)
PROGRESS NOTE    TEXAS OBORN  BWG:[id number] DOB: 1929-07-23 DOA: 01/24/2020 PCP: Jodi Marble, MD   Brief Narrative: 86 year old female multiple medical comorbidities who presents for evaluation of abdominal pain.  She is found to be Covid positive.  She is not requiring any oxygen.  She does have evidence of infiltrate on chest x-ray concerning for viral syndrome.  Started on IV remdesivir and IV steroids.  Also has atrial fibrillation with rapid ventricular response.  Presumably infectious driven.  Is on metoprolol.  Added Cardizem IV for rate control as needed.  Patient will be admitted to progressive cardiac floor with airborne and contact isolation precautions.  I have called and updated patient's daughter [name] via phone.  All questions answered.  I have also called in consultation with palliative care.  Cardiology also on board.  4/24: Patient seen and examined.  Does not believe that she has Covid.  Despite my explanations, she is insistent.  Seen by cardiology.  No change in management from their standpoint.  Patient continues on IV remdesivir and IV steroids.  Remains on room air.  Discussed with palliative care, they were unable to see patient yesterday due to large amount of consults.  We will follow up on Monday.  4/25: Patient seen and examined.  Continues on room air.  Continues on IV remdesivir and steroids.  Seen by physical therapy yesterday.  No PT follow-up recommended.  Discussed with daughter yesterday.  Daughter is concerned the patient is unable to care for herself at home.  Discussed with TOC member.  This may present disposition challenges.  Kidney function worsening over interval.  Diet advanced.  4/26: Patient seen and examined.  Continues to be on room air.  Continues on IV remdesivir.  Steroids stopped yesterday.  No physical therapy follow-up recommended.  Discussed case with case management and palliative care.  They will follow up with patient and  patient's family today.  Kidney function improving over interval.   Assessment & Plan:   Active Problems:   Pneumonia due to COVID-19 virus   Atrial fibrillation with rapid ventricular response (HCC)  Pneumonia secondary to COVID-19 virus Patient has had many possible home exposures Continues to think that she does not have Covid despite my explanation She has radiographic evidence of pneumonia on chest x-ray Currently not requiring any oxygen Plan: Continue remdesivir, day 4 of 5 DC IV steroids as patient has not been hypoxic Supplemental oxygen as needed Mobilize  AKI on CKD Baseline creatinine appears to be around 1.5-1.7 Creatinine improving over interval, peaked at 2.46 Suspect prerenal azotemia We will repeat another 500 cc bolus normal saline at 250 cc/h Recheck creatinine in the morning We will consider nephrology consult if creatinine not improving  Paroxysmal atrial fibrillation Continue metoprolol Continue Xarelto Outpatient cardiology consideration for cardioversion  Abdominal pain CT with no acute findings Improved over interval As needed antiemetics  Hyperlipidemia Continue statin  Elevated troponin Likely demand ischemia No chest pain or ischemic EKG changes Likely related Covid Observe on telemetry  History of gout No acute flare Continue allopurinol  Peripheral neuropathy Continue Neurontin  Malignant lymphoma Currently under treatment with ibrutinib Currently on hold considering acute illness Outpatient follow-up with oncology  DVT prophylaxis: Xarelto Code Status: Full code Family Communication:  Disposition Plan: Status is: Inpatient  Remains inpatient appropriate because:Inpatient level of care appropriate due to severity of illness   Dispo: The patient is from: Home  Anticipated d/c is to: Home              Anticipated d/c date is: 1 day              Patient currently is not medically stable to d/c.  Patient has  acute Covid infection.  She does have radiographic evidence of pneumonia however she is not hypoxic at this time.  No fevers noted.  She did have acute kidney injury on chronic kidney disease.  Creatinine is not yet at baseline but has been improving over the interval.  Also may be some disposition issues.  Currently patient does not meet criteria for skilled nursing facility placement however patient's daughter is concerned that patient may not be able to care for herself at home.  I requested TOC team member to reach out to the daughter and explain.  I have discussed this with the daughter on 01/25/2020.  Case discussed with [facility] team member [place].   Consultants:   Cardiology  Palliative care  Procedures:   none  Antimicrobials:   Remdesivir    Subjective: Seen and examined No complaints this morning Does not want disposition to rehab facility   Objective: Vitals:   01/26/20 2040 01/27/20 0029 01/27/20 0330 01/27/20 0938  BP: (!) 151/93 (!) 145/89  (!) 150/117  Pulse:  (!) 101  99  Resp:  20  18  Temp:  97.7 F (36.5 C)  (!) 97.5 F (36.4 C)  TempSrc:  Oral  Oral  SpO2:  (!) 66% 94% 98%  Weight:      Height:        Intake/Output Summary (Last 24 hours) at 01/27/2020 1026 Last data filed at 01/26/2020 1100 Gross per 24 hour  Intake 404.13 ml  Output --  Net 404.13 ml   Filed Weights   01/24/20 0059  Weight: 99.8 kg    Examination:  General exam: Frail-appearing elderly female Respiratory system: Bibasilar crackles, normal work of breathing Cardiovascular system: Regular rate, irregular rhythm, no murmurs, no pedal edema  gastrointestinal system: Nontender, nondistended, normal bowel sounds Central nervous system: Alert and oriented. No focal neurological deficits. Extremities: Symmetric 5 x 5 power. Skin: No rashes, lesions or ulcers Psychiatry: Judgement and insight appear normal. Mood & affect appropriate.     Data Reviewed: I have personally reviewed  following labs and imaging studies  CBC: Recent Labs  Lab 01/24/20 0106 01/25/20 0723 01/26/20 0621 01/27/20 0534  WBC 5.1 12.3* 14.4* 12.7*  NEUTROABS 3.8 10.5* 12.7* 10.8*  HGB 13.8 13.4 12.9 12.2  HCT 45.2 42.0 40.0 39.0  MCV 82.6 80.8 79.8* 81.4  PLT 150 141* 142* 270*   Basic Metabolic Panel: Recent Labs  Lab 01/24/20 0106 01/25/20 0723 01/26/20 0621 01/27/20 0534  NA 140 142 142 140  K 4.0 4.2 5.1 4.4  CL 108 108 110 112*  CO2 21* 22 23 21*  GLUCOSE 176* 118* 144* 137*  BUN 31* 36* 50* 62*  CREATININE 1.44* 1.71* 2.12* 2.07*  CALCIUM 8.6* 8.5* 8.6* 8.0*  MG  --  2.2 2.3 2.3   GFR: Estimated Creatinine Clearance: 21.6 mL/min (A) (by C-G formula based on SCr of 2.07 mg/dL (H)). Liver Function Tests: Recent Labs  Lab 01/24/20 0106 01/25/20 0723 01/26/20 0621 01/27/20 0534  AST 23 18 16  11*  ALT 15 12 13 12   ALKPHOS 52 54 51 46  BILITOT 1.5* 0.9 1.0 1.0  PROT 6.4* 6.6 6.3* 5.7*  ALBUMIN 2.9* 3.0* 2.8* 2.5*  Recent Labs  Lab 01/24/20 0106  LIPASE 37   No results for input(s): AMMONIA in the last 168 hours. Coagulation Profile: No results for input(s): INR, PROTIME in the last 168 hours. Cardiac Enzymes: No results for input(s): CKTOTAL, CKMB, CKMBINDEX, TROPONINI in the last 168 hours. BNP (last 3 results) No results for input(s): PROBNP in the last 8760 hours. HbA1C: No results for input(s): HGBA1C in the last 72 hours. CBG: No results for input(s): GLUCAP in the last 168 hours. Lipid Profile: No results for input(s): CHOL, HDL, LDLCALC, TRIG, CHOLHDL, LDLDIRECT in the last 72 hours. Thyroid Function Tests: No results for input(s): TSH, T4TOTAL, FREET4, T3FREE, THYROIDAB in the last 72 hours. Anemia Panel: Recent Labs    01/26/20 0621 01/27/20 0534  FERRITIN 178 169   Sepsis Labs: Recent Labs  Lab 01/24/20 0106 01/24/20 0300  PROCALCITON <0.10  --   LATICACIDVEN 2.2* 1.9    Recent Results (from the past 240 hour(s))  Urine  culture     Status: None   Collection Time: 01/24/20  1:06 AM   Specimen: Urine, Random  Result Value Ref Range Status   Specimen Description   Final    URINE, RANDOM Performed at [facility], [address], [place], [place] [address]    Special Requests   Final    NONE Performed at [facility], [address], [place], [place] [address]    Culture   Final    NO GROWTH Performed at [facility], [place] [address], [place], [place] [address]    Report Status 01/25/2020 FINAL  Final  Culture, blood (routine x 2)     Status: None (Preliminary result)   Collection Time: 01/24/20  1:07 AM   Specimen: BLOOD  Result Value Ref Range Status   Specimen Description BLOOD RAC  Final   Special Requests BOTTLES DRAWN AEROBIC AND ANAEROBIC BCAV  Final   Culture   Final    NO GROWTH 3 DAYS Performed at [facility], [address], [place], [place] [address]    Report Status PENDING  Incomplete  Culture, blood (routine x 2)     Status: None (Preliminary result)   Collection Time: 01/24/20  1:07 AM   Specimen: BLOOD  Result Value Ref Range Status   Specimen Description BLOOD LEFT HAND  Final   Special Requests BOTTLES DRAWN AEROBIC AND ANAEROBIC BCAV  Final   Culture   Final    NO GROWTH 3 DAYS Performed at [facility], [address] [place], [place] [address]    Report Status PENDING  Incomplete  Respiratory Panel by RT PCR (Flu A&B, Covid) - Nasopharyngeal Swab     Status: Abnormal   Collection Time: 01/24/20  1:11 AM   Specimen: Nasopharyngeal Swab  Result Value Ref Range Status   SARS Coronavirus 2 by RT PCR POSITIVE (A) NEGATIVE Final    Comment: RESULT CALLED TO, READ BACK BY AND VERIFIED WITHBarbette Merino RN [phone] 4/32/21 HNM (NOTE) SARS-CoV-2 target nucleic acids are DETECTED. SARS-CoV-2 RNA is generally detectable in upper respiratory specimens  during the acute phase of infection. Positive results are indicative of the  presence of the identified virus, but do not rule out bacterial infection or co-infection with other pathogens not detected by the test. Clinical correlation with patient history and other diagnostic information is necessary to determine patient infection status. The expected result is Negative. Fact Sheet for Patients:  PinkCheek.be Fact Sheet for Healthcare Providers:  GravelBags.it This test is not yet approved or cleared by the [place] and  has been authorized for detection and/or diagnosis of SARS-CoV-2 by FDA under an Emergency Use Authorization (EUA).  This EUA will remain in effect (meaning this test can be used) for t he duration of  the COVID-19 declaration under Section 564(b)(1) of the Act, 21 U.S.C. section 360bbb-3(b)(1), unless the authorization is terminated or revoked sooner.    Influenza A by PCR NEGATIVE NEGATIVE Final   Influenza B by PCR NEGATIVE NEGATIVE Final    Comment: (NOTE) The Xpert Xpress SARS-CoV-2/FLU/RSV assay is intended as an aid in  the diagnosis of influenza from Nasopharyngeal swab specimens and  should not be used as a sole basis for treatment. Nasal washings and  aspirates are unacceptable for Xpert Xpress SARS-CoV-2/FLU/RSV  testing. Fact Sheet for Patients: PinkCheek.be Fact Sheet for Healthcare Providers: GravelBags.it This test is not yet approved or cleared by the [place] FDA and  has been authorized for detection and/or diagnosis of SARS-CoV-2 by  FDA under an Emergency Use Authorization (EUA). This EUA will remain  in effect (meaning this test can be used) for the duration of the  Covid-19 declaration under Section 564(b)(1) of the Act, 21  U.S.C. section 360bbb-3(b)(1), unless the authorization is  terminated or revoked. Performed at [facility], [address] [place], [place] [address]           Radiology Studies: No results found.      Scheduled Meds: . allopurinol  200 mg Oral Daily  . vitamin C  1,000 mg Oral Daily  . aspirin EC  81 mg Oral Daily  . atorvastatin  20 mg Oral Daily  . cholecalciferol  1,000 Units Oral Daily  . dextromethorphan-guaiFENesin  1 tablet Oral BID  . gabapentin  300 mg Oral TID  . metoprolol succinate  75 mg Oral BID  . pantoprazole  40 mg Oral Daily  . Rivaroxaban  15 mg Oral Q supper  . zinc sulfate  220 mg Oral Daily   Continuous Infusions: . remdesivir 100 mg in NS 100 mL 100 mg (01/27/20 1023)     LOS: 3 days    Time spent: 35 minutes    Sidney Ace, MD [facility] Pager [phone]  If 7PM-7AM, please contact night-coverage 01/27/2020, 10:26 AM

## 2020-01-27 NOTE — TOC Progression Note (Signed)
Transition of Care [facility]) - Progression Note    Patient Details  Name: Kristy Solomon MRN: [id number] Date of Birth: 1929-02-11  [facility]) CM/SW Contact  Shelbie Hutching, RN Phone Number: 01/27/2020, 11:06 AM  Clinical Narrative:    RNCM spoke with patient via phone, patient reports that she would like to go home at discharge.  PT recommends no PT follow up.  Patient is open with [facility] for RN and PT.  Patient would like to continue to use [facility] for home health services.  Drue Novel with [facility] notified of admission and that patient will likely discharge tomorrow.  Patient will need RN, aide, and social work.     Expected Discharge Plan: [place] Barriers to Discharge: Continued Medical Work up  Expected Discharge Plan and Services Expected Discharge Plan: [place]   Discharge Planning Services: CM Consult Post Acute Care Choice: [place] arrangements for the past 2 months: Single Family Home                           HH Arranged: RN, Nurse's Aide, Social Work [employer] Agency: [facility] [employer] [facility] [employer] Date [place]: 01/27/20 Time [place]: 1104 Representative spoke with at [place]: [place] ([place]) Interventions    Readmission Risk Interventions No flowsheet data found.

## 2020-01-27 NOTE — Progress Notes (Signed)
PHARMACIST - PHYSICIAN COMMUNICATION  CONCERNING: IV to Oral Route Change Policy  RECOMMENDATION: This patient is receiving Protonix by the intravenous route.  Based on criteria approved by the Pharmacy and Therapeutics Committee, the intravenous medication(s) is/are being converted to the equivalent oral dose form(s).  DESCRIPTION: These criteria include:  The patient is eating (either orally or via tube) and/or has been taking other orally administered medications for a least 24 hours  The patient has no evidence of active gastrointestinal bleeding or impaired GI absorption (gastrectomy, short bowel, patient on TNA or NPO).  If you have questions about this conversion, please contact the [place], PharmD, BCPS Clinical Pharmacist 01/27/2020 8:43 AM

## 2020-01-27 NOTE — Significant Event (Signed)
Patient had been seen by palliative care earlier today.  I had spoken to them regarding the contents of her conversation.  At time of palliative care's evaluation and patient was made a DO NOT RESUSCITATE.  However when bedside nurse attempted to place DNR bracelet on the patient she inquired as to what the meaning of that was.  Upon nurses explanation the patient stated "I do not want to be a DNR."  We will need to reevaluate this with the patient in the morning.  Request palliative care follow-up in this regard.  For now we will make the patient a full code and order status has been changed in the computer.  Discussed with bedside RN  Ralene Muskrat MD

## 2020-01-27 NOTE — TOC Progression Note (Signed)
Transition of Care [facility]) - Progression Note    Patient Details  Name: Kristy Solomon MRN: [id number] Date of Birth: Dec 27, 1928  [facility]) CM/SW Contact  Shelbie Hutching, RN Phone Number: 01/27/2020, 3:49 PM  Clinical Narrative:    This RNCM spoke with patient's daughter, [name], about discharge planning.  [name] expresses concerns about the patient going home, [name] reports that patient cannot take care of herself at home and she and her family cannot come in and help while the patient has COVID.  [name] would like for the patient to be able to go to a skilled nursing facility at least for the 21 day isolation period.  Patient does have Medicaid, may be able to transition to long term care or go under long term care if [facility] declines authorization for skilled.   SNF authorization started, bed search started.    Expected Discharge Plan: [place] Barriers to Discharge: Continued Medical Work up  Expected Discharge Plan and Services Expected Discharge Plan: [place]   Discharge Planning Services: CM Consult Post Acute Care Choice: [place] arrangements for the past 2 months: Single Family Home                           HH Arranged: RN, Nurse's Aide, Social Work [employer] Agency: [facility] [facility] [employer] Date [place]: 01/27/20 Time [place]: 1104 Representative spoke with at [place]: [place] ([place]) Interventions    Readmission Risk Interventions No flowsheet data found.

## 2020-01-27 NOTE — Consult Note (Signed)
Consultation Note Date: 01/27/2020   Patient Name: Kristy Solomon  DOB: Nov 06, 1928  MRN: [id number]  Age / Sex: 86 y.o., female  PCP: Jodi Marble, MD Referring Physician: Sidney Ace, MD  Reason for Consultation: Establishing goals of care  HPI/Patient Profile: 86 y.o. female  with past medical history of hypertension, atrial fibrillation on Xarelto, dyslipidemia, malignant lymphoma on ibrutinib, CKD stage 3b,  admitted on 01/24/2020 with abd pain and admitted with multifocal pneumonia secondary to COVID-19 infection. Also under treatment for UTI and atrial fibrillation.   Clinical Assessment and Goals of Care: I have reviewed records and discussed with Dr. Priscella Mann.  I met today with Ms. Marcoe and no family/visitors at bedside. I assisted her to order another breakfast tray as she reported that she received the wrong food this morning. Otherwise she tells me that she is feeling well. She is ready to go home so that she can get some sleep (very loud from negative pressure). She denies any pain or discomfort, pain, constipation. She feels confident that she can manage at home without assistance and says she has been doing this prior to admission. PT recommends home. I did tell her that her family may not be able to assist her as much as they usually due because she is very contagious. She still feels she can manage at home.   We did discuss her overall health and concern with this pneumonia (and COVID) and her underlying lymphoma. We discussed overall concern for her underlying health conditions and progression and what this may look like in the future. When I asked about if she had ever spoken with her family or even thought about herself her wishes and what she may want if her health declines in the future she tells me this is not something that has been discussed or really thought about. We  discussed code status and initially she tells me "I'll do what I have to do" but after discussion of what this could look like and mean for her she tells me "maybe that's not the best" and reports that she does not wish to suffer at the end of her life. She is hopeful for treating what we can to keep her healthy and help her live as long as possible but in the end to keep her comfortable.   I called and spoke with daughter, [name]. We had a very good conversation. [name] has noted gradual functional decline and episodes of confusion from her mother at home. She is concerned that this has been worse since her last hospitalization and has been concerned about if she has even been eating at home. Also concerned that she was not taking any of her medications except for her ibrutinib. [name] was also under the impression from her mother that her lymphoma was resolved but she did question why she was still on medications for this. [name] voices serious concern for her mother's safety at home and concern about limitations to assistance secondary to COVID infection which I acknowledge. [employer]  would prefer for SNF stay and I did explain that there is criteria for insurance to pursue SNF that she has not met. I will speak with the team about concern for safety at home.   I also spoke with [name] about my conversation with her mother in which her mother initially desired full care but after conversation voiced desire that she would NOT want CPR, life support that could provide more suffering at the end of her life. [name] expresses that she agrees and is happy that we had this conversation as her mother has always requested full care in the past to her. She agrees for DNR to be put in place but continue full care otherwise.   All questions/concerns addressed. Emotional support provided. Discussed with Doran Clay, CMRN and Dr. Priscella Mann.   Primary Decision Maker NEXT OF KIN    SUMMARY OF RECOMMENDATIONS   - Family  questioning safety at home - [facility] following for assistance in resources - DNR decided with patient and daughter agrees  Code Status/Advance Care Planning:  DNR   Symptom Management:   Per attending. No current symptoms to manage.   PT/OT have consulted due to frailty and deconditioning.   Intake in hospital has been good.   Palliative Prophylaxis:   Bowel Regimen and Delirium Protocol  Psycho-social/Spiritual:   Desire for further Chaplaincy support:no  Additional Recommendations: Caregiving  Support/Resources and Referral to Community Resources   Prognosis:   Overall prognosis guarded with COVID pneumonia and underlying lymphoma in elderly patient.   Discharge Planning: Home with Palliative Services if she does not qualify for rehab     Primary Diagnoses: Present on Admission: . Pneumonia due to COVID-19 virus   I have reviewed the medical record, interviewed the patient and family, and examined the patient. The following aspects are pertinent.  Past Medical History:  Diagnosis Date  . 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 Hyperlipidemia   . Hypertension   . 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 Renal insufficiency    Social History   Socioeconomic History  . Marital status: Widowed    Spouse name: Not on file  . Number of children: Not on file  . Years of education: Not on file  . Highest education level: Not on file  Occupational History  . Not on file  Tobacco Use  . Smoking status: Never Smoker  . Smokeless tobacco: Never Used  Substance and Sexual Activity  . Alcohol use: No  . Drug use: No  . Sexual activity: Not on file  Other Topics Concern  . Not on file  Social History Narrative   Lives alone. Daughter assists as needed.   Social Determinants of Health   Financial Resource Strain:   . Difficulty of Paying Living Expenses:   Food Insecurity:   . Worried About Charity fundraiser in the Last Year:   . Youth worker in the Last Year:   Transportation Needs:   . Film/video editor (Medical):   Marland Kitchen Lack of Transportation (Non-Medical):   Physical Activity:   . Days of Exercise per Week:   . Minutes of Exercise per Session:   Stress:   . Feeling of Stress :   Social Connections:   . Frequency of Communication with Friends and Family:   . Frequency of Social Gatherings with Friends and Family:   . Attends Religious Services:   . Active Member of Clubs or Organizations:   . Attends Archivist  Meetings:   Marland Kitchen Marital Status:    Family History  Problem Relation Age of Onset  . Cancer Brother        throat ca  . Cancer Brother        bone cancer   Scheduled Meds: . allopurinol  200 mg Oral Daily  . vitamin C  1,000 mg Oral Daily  . aspirin EC  81 mg Oral Daily  . atorvastatin  20 mg Oral Daily  . cholecalciferol  1,000 Units Oral Daily  . dexamethasone (DECADRON) injection  6 mg Intravenous Q24H  . dextromethorphan-guaiFENesin  1 tablet Oral BID  . gabapentin  300 mg Oral TID  . metoprolol succinate  75 mg Oral BID  . pantoprazole  40 mg Oral Daily  . Rivaroxaban  15 mg Oral Q supper  . zinc sulfate  220 mg Oral Daily   Continuous Infusions: . remdesivir 100 mg in NS 100 mL Stopped (01/26/20 0909)   PRN Meds:.acetaminophen, albuterol, chlorpheniramine-HYDROcodone, diltiazem, guaiFENesin-dextromethorphan, hydrALAZINE, magnesium hydroxide, traZODone Allergies  Allergen Reactions  . Eliquis [Apixaban]     abd pain   Review of Systems  Constitutional: Positive for fatigue. Negative for appetite change.  Respiratory: Negative for shortness of breath.   Gastrointestinal: Negative for abdominal pain and constipation.  Psychiatric/Behavioral: Positive for sleep disturbance.    Physical Exam Vitals and nursing note reviewed.  Constitutional:      General: She is not in acute distress.    Comments: Frail, elderly  Cardiovascular:     Rate and Rhythm: Tachycardia  present. Rhythm irregularly irregular.  Pulmonary:     Effort: Pulmonary effort is normal. No tachypnea, accessory muscle usage or respiratory distress.  Abdominal:     General: Abdomen is flat.     Palpations: Abdomen is soft.  Neurological:     Mental Status: She is alert and oriented to person, place, and time.     Vital Signs: BP (!) 145/89 (BP Location: Right Arm)   Pulse (!) 101   Temp 97.7 F (36.5 C) (Oral)   Resp 20   Ht 5' 4"  (1.626 m)   Wt 99.8 kg   SpO2 94%   BMI 37.76 kg/m  Pain Scale: 0-10   Pain Score: 4    SpO2: SpO2: 94 % O2 Device:SpO2: 94 % O2 Flow Rate: .   IO: Intake/output summary:   Intake/Output Summary (Last 24 hours) at 01/27/2020 0850 Last data filed at 01/26/2020 1100 Gross per 24 hour  Intake 404.13 ml  Output --  Net 404.13 ml    LBM: Last BM Date: 01/26/20 Baseline Weight: Weight: 99.8 kg Most recent weight: Weight: 99.8 kg     Palliative Assessment/Data:     Time In/Out: 4081-4481, 1400-1450 Time Total: 70 min Greater than 50%  of this time was spent counseling and coordinating care related to the above assessment and plan.  Signed by: [name], NP Palliative Medicine Team Pager # [phone] (M-F 8a-5p) Team Phone # [phone] (Nights/Weekends)

## 2020-01-27 NOTE — Care Management Important Message (Signed)
Important Message  Patient Details  Name: Kristy Solomon MRN: [id number] Date of Birth: [birth year]   Medicare Important Message Given:  Yes     Shelbie Hutching, RN 01/27/2020, 12:32 PM

## 2020-01-27 NOTE — Progress Notes (Signed)
Progress Note  Patient Name: Kristy Solomon Date of Encounter: 01/27/2020  Primary Cardiologist: new ( Dr. Rockey Situ)  Subjective   The patient was not seen to minimize exposure to COVID-19 but I did review her medications, vital signs and other documentations.  Inpatient Medications    Scheduled Meds: . allopurinol  200 mg Oral Daily  . vitamin C  1,000 mg Oral Daily  . aspirin EC  81 mg Oral Daily  . atorvastatin  20 mg Oral Daily  . cholecalciferol  1,000 Units Oral Daily  . dextromethorphan-guaiFENesin  1 tablet Oral BID  . gabapentin  300 mg Oral TID  . metoprolol succinate  75 mg Oral BID  . pantoprazole  40 mg Oral Daily  . Rivaroxaban  15 mg Oral Q supper  . zinc sulfate  220 mg Oral Daily   Continuous Infusions: . remdesivir 100 mg in NS 100 mL 100 mg (01/27/20 1023)   PRN Meds: acetaminophen, albuterol, chlorpheniramine-HYDROcodone, diltiazem, guaiFENesin-dextromethorphan, hydrALAZINE, magnesium hydroxide, traZODone   Vital Signs    Vitals:   01/27/20 0938 01/27/20 1128 01/27/20 1458 01/27/20 1609  BP: (!) 150/117 132/85 (!) 161/117 (!) 124/94  Pulse: 99 99 93   Resp: 18  18   Temp: (!) 97.5 F (36.4 C)  (!) 97.1 F (36.2 C)   TempSrc: Oral  Axillary   SpO2: 98%  98%   Weight:      Height:        Intake/Output Summary (Last 24 hours) at 01/27/2020 1616 Last data filed at 01/27/2020 1505 Gross per 24 hour  Intake 360 ml  Output --  Net 360 ml   Last 3 Weights 01/24/2020 01/09/2020 01/08/2020  Weight (lbs) 220 lb 200 lb 14.4 oz 202 lb 4.8 oz  Weight (kg) 99.791 kg 91.128 kg 91.763 kg      Telemetry     ECG    Atrial fibrillation on presentation- Personally Reviewed.  No EKGs done today.  Physical Exam  No physical exam performed today to minimize exposure to COVID-19.  Labs    High Sensitivity Troponin:   Recent Labs  Lab 2020/01/23 1637 01/24/20 0106 01/24/20 0300 01/25/20 0723 01/26/20 0621  TROPONINIHS 49* 294* 256* 327* 261*       Chemistry Recent Labs  Lab 01/25/20 0723 01/26/20 0621 01/27/20 0534  NA 142 142 140  K 4.2 5.1 4.4  CL 108 110 112*  CO2 22 23 21*  GLUCOSE 118* 144* 137*  BUN 36* 50* 62*  CREATININE 1.71* 2.12* 2.07*  CALCIUM 8.5* 8.6* 8.0*  PROT 6.6 6.3* 5.7*  ALBUMIN 3.0* 2.8* 2.5*  AST 18 16 11*  ALT 12 13 12   ALKPHOS 54 51 46  BILITOT 0.9 1.0 1.0  GFRNONAA 26* 20* 21*  GFRAA 30* 23* 24*  ANIONGAP 12 9 7      Hematology Recent Labs  Lab 01/25/20 0723 01/26/20 0621 01/27/20 0534  WBC 12.3* 14.4* 12.7*  RBC 5.20* 5.01 4.79  HGB 13.4 12.9 12.2  HCT 42.0 40.0 39.0  MCV 80.8 79.8* 81.4  MCH 25.8* 25.7* 25.5*  MCHC 31.9 32.3 31.3  RDW 16.6* 17.0* 17.1*  PLT 141* 142* 122*    BNP Recent Labs  Lab 01/24/20 0106  BNP 262.0*     DDimer No results for input(s): DDIMER in the last 168 hours.   Radiology    No results found.  Cardiac Studies   Echocardiogram done on 01/23/23 showed an EF of 50 to 55%.  There was mild mitral regurgitation  Patient Profile     86 y.o. female with past medical history of chronic diastolic heart failure, persistent atrial fibrillation, essential hypertension, hyperlipidemia, , lymphoma, CKD,chronic LEE,and anemia/thrombocytopenia, and who is being followed for atrial fibrillation and mildly elevated troponin in the setting of COVID-19 infection  Assessment & Plan    1.  Atrial fibrillation: Persistent since January.  Rate is reasonably controlled on current dose of Toprol 75 mg twice daily.  She is on anticoagulation with Xarelto 15 mg twice daily.  Given advanced chronic kidney disease, Eliquis 2.5 mg twice daily might be a better option with less bleeding risk.  2.  Mildly elevated troponin : Likely supply demand ischemia.  No plans for ischemic work-up.  3.  COVID-19 infection: Currently on steroids and remdesivir.    For questions or updates, please contact [place] Please consult [email or web address] for contact info under         Signed, Kathlyn Sacramento, MD  01/27/2020, 4:16 PM

## 2020-01-28 DIAGNOSIS — I4819 Other persistent atrial fibrillation: Secondary | ICD-10-CM

## 2020-01-28 DIAGNOSIS — N179 Acute kidney failure, unspecified: Secondary | ICD-10-CM

## 2020-01-28 DIAGNOSIS — N189 Chronic kidney disease, unspecified: Secondary | ICD-10-CM

## 2020-01-28 LAB — CBC WITH DIFFERENTIAL/PLATELET
Abs Immature Granulocytes: 0.27 10*3/uL — ABNORMAL HIGH (ref 0.00–0.07)
Basophils Absolute: 0.1 10*3/uL (ref 0.0–0.1)
Basophils Relative: 0 %
Eosinophils Absolute: 0 10*3/uL (ref 0.0–0.5)
Eosinophils Relative: 0 %
HCT: 41.5 % (ref 36.0–46.0)
Hemoglobin: 13.3 g/dL (ref 12.0–15.0)
Immature Granulocytes: 2 %
Lymphocytes Relative: 6 %
Lymphs Abs: 0.7 10*3/uL (ref 0.7–4.0)
MCH: 25.8 pg — ABNORMAL LOW (ref 26.0–34.0)
MCHC: 32 g/dL (ref 30.0–36.0)
MCV: 80.4 fL (ref 80.0–100.0)
Monocytes Absolute: 0.9 10*3/uL (ref 0.1–1.0)
Monocytes Relative: 7 %
Neutro Abs: 11 10*3/uL — ABNORMAL HIGH (ref 1.7–7.7)
Neutrophils Relative %: 85 %
Platelets: 136 10*3/uL — ABNORMAL LOW (ref 150–400)
RBC: 5.16 MIL/uL — ABNORMAL HIGH (ref 3.87–5.11)
RDW: 17.1 % — ABNORMAL HIGH (ref 11.5–15.5)
Smear Review: NORMAL
WBC: 12.9 10*3/uL — ABNORMAL HIGH (ref 4.0–10.5)
nRBC: 0.8 % — ABNORMAL HIGH (ref 0.0–0.2)

## 2020-01-28 LAB — COMPREHENSIVE METABOLIC PANEL
ALT: 13 U/L (ref 0–44)
AST: 11 U/L — ABNORMAL LOW (ref 15–41)
Albumin: 2.7 g/dL — ABNORMAL LOW (ref 3.5–5.0)
Alkaline Phosphatase: 54 U/L (ref 38–126)
Anion gap: 8 (ref 5–15)
BUN: 59 mg/dL — ABNORMAL HIGH (ref 8–23)
CO2: 20 mmol/L — ABNORMAL LOW (ref 22–32)
Calcium: 8.3 mg/dL — ABNORMAL LOW (ref 8.9–10.3)
Chloride: 112 mmol/L — ABNORMAL HIGH (ref 98–111)
Creatinine, Ser: 1.71 mg/dL — ABNORMAL HIGH (ref 0.44–1.00)
GFR calc Af Amer: 30 mL/min — ABNORMAL LOW (ref >60)
GFR calc non Af Amer: 26 mL/min — ABNORMAL LOW (ref >60)
Glucose, Bld: 133 mg/dL — ABNORMAL HIGH (ref 70–99)
Potassium: 4.4 mmol/L (ref 3.5–5.1)
Sodium: 140 mmol/L (ref 135–145)
Total Bilirubin: 1 mg/dL (ref 0.3–1.2)
Total Protein: 6.2 g/dL — ABNORMAL LOW (ref 6.5–8.1)

## 2020-01-28 LAB — FIBRIN DERIVATIVES D-DIMER (ARMC ONLY): Fibrin derivatives D-dimer (ARMC): 2113.15 ng/mL (FEU) — ABNORMAL HIGH (ref 0.00–499.00)

## 2020-01-28 LAB — FERRITIN: Ferritin: 156 ng/mL (ref 11–307)

## 2020-01-28 LAB — C-REACTIVE PROTEIN: CRP: 2.9 mg/dL — ABNORMAL HIGH (ref <1.0)

## 2020-01-28 LAB — MAGNESIUM: Magnesium: 2.4 mg/dL (ref 1.7–2.4)

## 2020-01-28 MED ORDER — LABETALOL HCL 5 MG/ML IV SOLN: 20.0000 mg | INTRAVENOUS | Status: DC | PRN

## 2020-01-28 MED ORDER — LABETALOL HCL 5 MG/ML IV SOLN
INTRAVENOUS | Status: AC
  Filled 2020-01-28: qty 4

## 2020-01-28 MED ORDER — ALUM & MAG HYDROXIDE-SIMETH 200-200-20 MG/5ML PO SUSP
30.0000 mL | ORAL | Status: DC | PRN
  Filled 2020-01-28: qty 30

## 2020-01-28 NOTE — Progress Notes (Signed)
Palliative:  HPI: 86 y.o. female  with past medical history of hypertension, atrial fibrillation on Xarelto, dyslipidemia, malignant lymphoma on ibrutinib, CKD stage 3b,  admitted on 01/24/2020 with abd pain and admitted with multifocal pneumonia secondary to COVID-19 infection. Also under treatment for UTI and atrial fibrillation.    I met again today at [name]'s bedside. No family or visitors at bedside. There was confusion yesterday afternoon about code status. I readdressed this today with Kristy Solomon. She does not recall our conversation yesterday and today she is unsure what her wishes would be. Today she is more interested in eating her breakfast that discussions with me. She is feeding self. She is oriented but forgetful. She denies any pain or discomforts. No complaints. Reports that she was able to sleep better last night.   I also discussed again with TOC and they are working to see if they can find placement in facility given lack of help at home and home being potentially unsafe discharge plan. Kristy Solomon tells me that she is okay with going to a facility temporarily at discharge.   I called and update daughter, Kristy Solomon. I explained the confusion with code status and now her mother doesn't know what she wants. Also that her mother did not seem to recall our conversation from yesterday and Kristy Solomon tells me that she has been very forgetful lately. She is still hopeful for transition to nursing facility. She voiced concern with struggles to speak with her mother and difficulty hearing over the phone which I acknowledged is an issue. She had no further questions or concerns.   All questions/concerns addressed. Emotional support provided. Updated Dr. Sreenath.   Exam: Alert, oriented but forgetful. Sitting up eating her breakfast and drinking coffee. No distress. Breathing regular, unlabored. Abd flat. Generalized weakness.   Plan: - Indecisive for code status and ultimate goals of care.  Recommend outpatient palliative follow up to continue conversations.  - TOC working on placement.   25 min  Alicia Parker, NP Palliative Medicine Team Pager [phone] (Please see [email or web address] for schedule) Team Phone [phone]    Greater than 50%  of this time was spent counseling and coordinating care related to the above assessment and plan  

## 2020-01-28 NOTE — Plan of Care (Addendum)
Called and s/w daughter to give update on current POC and patient status.  No further questions at this point.  Also assisted daughter with speaking with patient on the phone.

## 2020-01-28 NOTE — TOC Progression Note (Signed)
Transition of Care [facility]) - Progression Note    Patient Details  Name: Kristy Solomon MRN: [id number] Date of Birth: [birth year]  Transition of [facility] CM/SW Contact  Shelbie Hutching, RN Phone Number: 01/28/2020, 2:10 PM  Clinical Narrative:    Insurance authorization is pending for SNF rehab.  Patient's daughter chooses [facility] for rehab.     Expected Discharge Plan: [place] Barriers to Discharge: Continued Medical Work up  Expected Discharge Plan and Services Expected Discharge Plan: [place]   Discharge Planning Services: CM Consult Post Acute Care Choice: [place] arrangements for the past 2 months: Single Family Home                           HH Arranged: RN, Nurse's Aide, Social Work [employer] Agency: [facility] [employer] Date [place]: 01/27/20 Time [place]: 1104 Representative spoke with at [place]: [place] Interventions    Readmission Risk Interventions No flowsheet data found.

## 2020-01-28 NOTE — Progress Notes (Signed)
PROGRESS NOTE    Kristy Solomon  OMV:[id number] DOB: 05-Oct-1928 DOA: 01/24/2020 PCP: Jodi Marble, MD   Brief Narrative: 86 year old female multiple medical comorbidities who presents for evaluation of abdominal pain.  She is found to be Covid positive.  She is not requiring any oxygen.  She does have evidence of infiltrate on chest x-ray concerning for viral syndrome.  Started on IV remdesivir and IV steroids.  Also has atrial fibrillation with rapid ventricular response.  Presumably infectious driven.  Is on metoprolol.  Added Cardizem IV for rate control as needed.  Patient will be admitted to progressive cardiac floor with airborne and contact isolation precautions.  I have called and updated patient's daughter [name] via phone.  All questions answered.  I have also called in consultation with palliative care.  Cardiology also on board.  4/24: Patient seen and examined.  Does not believe that she has Covid.  Despite my explanations, she is insistent.  Seen by cardiology.  No change in management from their standpoint.  Patient continues on IV remdesivir and IV steroids.  Remains on room air.  Discussed with palliative care, they were unable to see patient yesterday due to large amount of consults.  We will follow up on Monday.  4/25: Patient seen and examined.  Continues on room air.  Continues on IV remdesivir and steroids.  Seen by physical therapy yesterday.  No PT follow-up recommended.  Discussed with daughter yesterday.  Daughter is concerned the patient is unable to care for herself at home.  Discussed with TOC member.  This may present disposition challenges.  Kidney function worsening over interval.  Diet advanced.  4/26: Patient seen and examined.  Continues to be on room air.  Continues on IV remdesivir.  Steroids stopped yesterday.  No physical therapy follow-up recommended.  Discussed case with case management and palliative care.  They will follow up with patient and  patient's family today.  Kidney function improving over interval.  4/27: Patient seen and examined.  Continues on room air.  Respiratory status stable.  Continues on IV remdesivir.  No steroids as no hypoxia noted.  Case management has been in contact with the patient's daughter who continues to request rehab placement.  Case management aware and is currently exploring options for facility placement.  Kidney function continues to improve.  Approaching baseline.  Assessment & Plan:   Active Problems:   Pneumonia due to COVID-19 virus   Atrial fibrillation with rapid ventricular response (HCC)   Palliative care by specialist  Pneumonia secondary to COVID-19 virus Patient has had many possible home exposures Continues to think that she does not have Covid despite my explanation She has radiographic evidence of pneumonia on chest x-ray Currently not requiring any oxygen Plan: No steroids indicated Continue remdesivir, day 5 of 5 Supplemental oxygen as needed Mobilize  AKI on CKD Baseline creatinine appears to be around 1.5-1.7 Creatinine improving over interval, peaked at 2.46 Now down to 1.7 as of 4/27 Suspect prerenal azotemia Recheck creatinine in the morning   Paroxysmal atrial fibrillation Continue metoprolol Continue Xarelto Outpatient cardiology consideration for cardioversion  Abdominal pain CT with no acute findings Improved over interval As needed antiemetics  Hyperlipidemia Continue statin  Elevated troponin Likely demand ischemia No chest pain or ischemic EKG changes Likely related Covid Observe on telemetry  History of gout No acute flare Continue allopurinol  Peripheral neuropathy Continue Neurontin  Malignant lymphoma Currently under treatment with ibrutinib Currently on hold considering acute illness Outpatient  follow-up with oncology  Goals of Care Appreciate input from palliative care service.  After their discussion with the patient on  01/27/2020 as well as the patient's daughter via phone the patient was made a DO NOT RESUSCITATE.  However when the patient's nurse went back into place a DNR bracelet on the patient stated that she did not want to be a DNR.  Had palliative care reevaluate and discussed with patient today.  Today the patient is a little less sure of her decision and does not remember speaking with palliative care yesterday.  Will keep the patient is a full code for now.  Patient would likely benefit from outpatient palliative care follow-up.  DVT prophylaxis: Xarelto Code Status: Full code Family Communication: None today.  Case management and nursing have been in contact with the patient's daughter.  All questions have been answered. Disposition Plan: Status is: Inpatient  Remains inpatient appropriate because:Inpatient level of care appropriate due to severity of illness   Dispo: The patient is from: Home              Anticipated d/c is to: SNF              Anticipated d/c date is: 1 day              Patient currently is not medically stable to d/c.   Patient currently completed treatment for of pneumonia at this time.  Last day of remdesivir.  On room air.  Hemodynamically stable.  Patient's daughter is concerned the patient is unable to care for herself at home and is requesting facility placement.  Physical therapy and Occupational Therapy both recommended no follow-up from their standpoint.  Case management is looking into options.  If skilled nursing facility is not an option we may be limited to discharge home with home health.         Consultants:   Cardiology  Palliative care  Procedures:   none  Antimicrobials:   Remdesivir    Subjective: Seen and examined No complaints this morning Does not want disposition to rehab facility   Objective: Vitals:   01/28/20 0108 01/28/20 0200 01/28/20 0926 01/28/20 1057  BP: (!) 134/121 (!) 117/92 (!) 143/69 (!) 148/72  Pulse: 75 82 75 80    Resp:   17   Temp:   98.2 F (36.8 C)   TempSrc:   Axillary   SpO2:   100%   Weight:      Height:        Intake/Output Summary (Last 24 hours) at 01/28/2020 1457 Last data filed at 01/28/2020 0409 Gross per 24 hour  Intake 460 ml  Output 500 ml  Net -40 ml   Filed Weights   01/24/20 0059  Weight: 99.8 kg    Examination:  General exam: Frail-appearing elderly female Respiratory system: Bibasilar crackles, normal work of breathing Cardiovascular system: Regular rate, irregular rhythm, no murmurs, no pedal edema  gastrointestinal system: Nontender, nondistended, normal bowel sounds Central nervous system: Alert and oriented. No focal neurological deficits. Extremities: Symmetric 5 x 5 power. Skin: No rashes, lesions or ulcers Psychiatry: Judgement and insight appear normal. Mood & affect appropriate.     Data Reviewed: I have personally reviewed following labs and imaging studies  CBC: Recent Labs  Lab 01/24/20 0106 01/25/20 0723 01/26/20 0621 01/27/20 0534 01/28/20 0642  WBC 5.1 12.3* 14.4* 12.7* 12.9*  NEUTROABS 3.8 10.5* 12.7* 10.8* 11.0*  HGB 13.8 13.4 12.9 12.2 13.3  HCT 45.2 42.0  40.0 39.0 41.5  MCV 82.6 80.8 79.8* 81.4 80.4  PLT 150 141* 142* 122* 169*   Basic Metabolic Panel: Recent Labs  Lab 01/24/20 0106 01/25/20 0723 01/26/20 0621 01/27/20 0534 01/28/20 0642  NA 140 142 142 140 140  K 4.0 4.2 5.1 4.4 4.4  CL 108 108 110 112* 112*  CO2 21* 22 23 21* 20*  GLUCOSE 176* 118* 144* 137* 133*  BUN 31* 36* 50* 62* 59*  CREATININE 1.44* 1.71* 2.12* 2.07* 1.71*  CALCIUM 8.6* 8.5* 8.6* 8.0* 8.3*  MG  --  2.2 2.3 2.3 2.4   GFR: Estimated Creatinine Clearance: 26.1 mL/min (A) (by C-G formula based on SCr of 1.71 mg/dL (H)). Liver Function Tests: Recent Labs  Lab 01/24/20 0106 01/25/20 0723 01/26/20 0621 01/27/20 0534 01/28/20 0642  AST 23 18 16  11* 11*  ALT 15 12 13 12 13   ALKPHOS 52 54 51 46 54  BILITOT 1.5* 0.9 1.0 1.0 1.0  PROT 6.4* 6.6  6.3* 5.7* 6.2*  ALBUMIN 2.9* 3.0* 2.8* 2.5* 2.7*   Recent Labs  Lab 01/24/20 0106  LIPASE 37   No results for input(s): AMMONIA in the last 168 hours. Coagulation Profile: No results for input(s): INR, PROTIME in the last 168 hours. Cardiac Enzymes: No results for input(s): CKTOTAL, CKMB, CKMBINDEX, TROPONINI in the last 168 hours. BNP (last 3 results) No results for input(s): PROBNP in the last 8760 hours. HbA1C: No results for input(s): HGBA1C in the last 72 hours. CBG: No results for input(s): GLUCAP in the last 168 hours. Lipid Profile: No results for input(s): CHOL, HDL, LDLCALC, TRIG, CHOLHDL, LDLDIRECT in the last 72 hours. Thyroid Function Tests: No results for input(s): TSH, T4TOTAL, FREET4, T3FREE, THYROIDAB in the last 72 hours. Anemia Panel: Recent Labs    01/27/20 0534 01/28/20 0642  FERRITIN 169 156   Sepsis Labs: Recent Labs  Lab 01/24/20 0106 01/24/20 0300  PROCALCITON <0.10  --   LATICACIDVEN 2.2* 1.9    Recent Results (from the past 240 hour(s))  Urine culture     Status: None   Collection Time: 01/24/20  1:06 AM   Specimen: Urine, Random  Result Value Ref Range Status   Specimen Description   Final    URINE, RANDOM Performed at [facility], [address], [place], [place] [address]    Special Requests   Final    NONE Performed at [facility], [address], [place], [place] [address]    Culture   Final    NO GROWTH Performed at [facility], [place] [address] [place], [place] [address]    Report Status 01/25/2020 FINAL  Final  Culture, blood (routine x 2)     Status: None (Preliminary result)   Collection Time: 01/24/20  1:07 AM   Specimen: BLOOD  Result Value Ref Range Status   Specimen Description BLOOD RAC  Final   Special Requests BOTTLES DRAWN AEROBIC AND ANAEROBIC BCAV  Final   Culture   Final    NO GROWTH 4 DAYS Performed at [facility] [address], [place], [place] [address]     Report Status PENDING  Incomplete  Culture, blood (routine x 2)     Status: None (Preliminary result)   Collection Time: 01/24/20  1:07 AM   Specimen: BLOOD  Result Value Ref Range Status   Specimen Description BLOOD LEFT HAND  Final   Special Requests BOTTLES DRAWN AEROBIC AND ANAEROBIC BCAV  Final   Culture  Final    NO GROWTH 4 DAYS Performed at [facility], [address] [place], [place] [address]    Report Status PENDING  Incomplete  Respiratory Panel by RT PCR (Flu A&B, Covid) - Nasopharyngeal Swab     Status: Abnormal   Collection Time: 01/24/20  1:11 AM   Specimen: Nasopharyngeal Swab  Result Value Ref Range Status   SARS Coronavirus 2 by RT PCR POSITIVE (A) NEGATIVE Final    Comment: RESULT CALLED TO, READ BACK BY AND VERIFIED WITHBarbette Merino RN [phone] 4/32/21 HNM (NOTE) SARS-CoV-2 target nucleic acids are DETECTED. SARS-CoV-2 RNA is generally detectable in upper respiratory specimens  during the acute phase of infection. Positive results are indicative of the presence of the identified virus, but do not rule out bacterial infection or co-infection with other pathogens not detected by the test. Clinical correlation with patient history and other diagnostic information is necessary to determine patient infection status. The expected result is Negative. Fact Sheet for Patients:  PinkCheek.be Fact Sheet for Healthcare Providers: GravelBags.it This test is not yet approved or cleared by the [place] FDA and  has been authorized for detection and/or diagnosis of SARS-CoV-2 by FDA under an Emergency Use Authorization (EUA).  This EUA will remain in effect (meaning this test can be used) for t he duration of  the COVID-19 declaration under Section 564(b)(1) of the Act, 21 U.S.C. section 360bbb-3(b)(1), unless the authorization is terminated or revoked sooner.    Influenza A by PCR NEGATIVE NEGATIVE  Final   Influenza B by PCR NEGATIVE NEGATIVE Final    Comment: (NOTE) The Xpert Xpress SARS-CoV-2/FLU/RSV assay is intended as an aid in  the diagnosis of influenza from Nasopharyngeal swab specimens and  should not be used as a sole basis for treatment. Nasal washings and  aspirates are unacceptable for Xpert Xpress SARS-CoV-2/FLU/RSV  testing. Fact Sheet for Patients: PinkCheek.be Fact Sheet for Healthcare Providers: GravelBags.it This test is not yet approved or cleared by the [place] FDA and  has been authorized for detection and/or diagnosis of SARS-CoV-2 by  FDA under an Emergency Use Authorization (EUA). This EUA will remain  in effect (meaning this test can be used) for the duration of the  Covid-19 declaration under Section 564(b)(1) of the Act, 21  U.S.C. section 360bbb-3(b)(1), unless the authorization is  terminated or revoked. Performed at [facility], [address], [place],  [address]          Radiology Studies: No results found.      Scheduled Meds: . allopurinol  200 mg Oral Daily  . vitamin C  1,000 mg Oral Daily  . aspirin EC  81 mg Oral Daily  . atorvastatin  20 mg Oral Daily  . cholecalciferol  1,000 Units Oral Daily  . dextromethorphan-guaiFENesin  1 tablet Oral BID  . gabapentin  300 mg Oral TID  . metoprolol succinate  75 mg Oral BID  . pantoprazole  40 mg Oral Daily  . Rivaroxaban  15 mg Oral Q supper  . zinc sulfate  220 mg Oral Daily   Continuous Infusions:    LOS: 4 days    Time spent: 35 minutes    Sidney Ace, MD [facility] Pager [phone]  If 7PM-7AM, please contact night-coverage 01/28/2020, 2:57 PM

## 2020-01-28 NOTE — Progress Notes (Signed)
Progress Note  Patient Name: Kristy Solomon Date of Encounter: 01/28/2020  Primary Cardiologist: [name]  Subjective   Patient complains of pain in her left index, ring, and small fingers.  No chest pain, shortness of breath, palpitations, or lightheadedness.  Inpatient Medications    Scheduled Meds: . allopurinol  200 mg Oral Daily  . vitamin C  1,000 mg Oral Daily  . aspirin EC  81 mg Oral Daily  . atorvastatin  20 mg Oral Daily  . cholecalciferol  1,000 Units Oral Daily  . dextromethorphan-guaiFENesin  1 tablet Oral BID  . gabapentin  300 mg Oral TID  . metoprolol succinate  75 mg Oral BID  . pantoprazole  40 mg Oral Daily  . Rivaroxaban  15 mg Oral Q supper  . zinc sulfate  220 mg Oral Daily   Continuous Infusions:  PRN Meds: acetaminophen, albuterol, alum & mag hydroxide-simeth, chlorpheniramine-HYDROcodone, diltiazem, guaiFENesin-dextromethorphan, hydrALAZINE, labetalol, magnesium hydroxide, traZODone   Vital Signs    Vitals:   01/28/20 0108 01/28/20 0200 01/28/20 0926 01/28/20 1057  BP: (!) 134/121 (!) 117/92 (!) 143/69 (!) 148/72  Pulse: 75 82 75 80  Resp:   17   Temp:   98.2 F (36.8 C)   TempSrc:   Axillary   SpO2:   100%   Weight:      Height:        Intake/Output Summary (Last 24 hours) at 01/28/2020 1754 Last data filed at 01/28/2020 0409 Gross per 24 hour  Intake -  Output 500 ml  Net -500 ml   Last 3 Weights 01/24/2020 01/09/2020 01/08/2020  Weight (lbs) 220 lb 200 lb 14.4 oz 202 lb 4.8 oz  Weight (kg) 99.791 kg 91.128 kg 91.763 kg      Telemetry    Disconnected.  New tracing available since 01/25/2020.  ECG    No new tracing.  Physical Exam   GEN: No acute distress.   Neck: No JVD Cardiac:  Distant heart sounds.  Sounds regular. Respiratory:  Mildly diminished breath sounds throughout. GI: Soft, nontender, non-distended  MS:  1+ pretibial edema; No deformity. Neuro:  Nonfocal  Psych: Normal affect   Labs    High  Sensitivity Troponin:   Recent Labs  Lab 12/31/19 1637 01/24/20 0106 01/24/20 0300 01/25/20 0723 01/26/20 0621  TROPONINIHS 49* 294* 256* 327* 261*      Chemistry Recent Labs  Lab 01/26/20 0621 01/27/20 0534 01/28/20 0642  NA 142 140 140  K 5.1 4.4 4.4  CL 110 112* 112*  CO2 23 21* 20*  GLUCOSE 144* 137* 133*  BUN 50* 62* 59*  CREATININE 2.12* 2.07* 1.71*  CALCIUM 8.6* 8.0* 8.3*  PROT 6.3* 5.7* 6.2*  ALBUMIN 2.8* 2.5* 2.7*  AST 16 11* 11*  ALT 13 12 13   ALKPHOS 51 46 54  BILITOT 1.0 1.0 1.0  GFRNONAA 20* 21* 26*  GFRAA 23* 24* 30*  ANIONGAP 9 7 8      Hematology Recent Labs  Lab 01/26/20 0621 01/27/20 0534 01/28/20 0642  WBC 14.4* 12.7* 12.9*  RBC 5.01 4.79 5.16*  HGB 12.9 12.2 13.3  HCT 40.0 39.0 41.5  MCV 79.8* 81.4 80.4  MCH 25.7* 25.5* 25.8*  MCHC 32.3 31.3 32.0  RDW 17.0* 17.1* 17.1*  PLT 142* 122* 136*    BNP Recent Labs  Lab 01/24/20 0106  BNP 262.0*     DDimer No results for input(s): DDIMER in the last 168 hours.   Radiology    No  results found.  Cardiac Studies   Echocardiogram done on January 09, 2023 showed an EF of 50 to 55%.  There was mild mitral regurgitation  Patient Profile     86 y.o. female with past medical history of chronic diastolic heart failure, persistent atrial fibrillation, essential hypertension, hyperlipidemia, , lymphoma, CKD,chronic LEE,and anemia/thrombocytopenia,andwho is being followed for atrial fibrillation and mildly elevated troponin in the setting of COVID-19 infection  Assessment & Plan    Persistent atrial fibrillation: Heart rate sounds fairly regular today, though difficult to assess secondary to distant heart sounds.  Patient is not currently on telemetry.  Continue rivaroxaban 15 mg daily and metoprolol succinate 75 mg twice daily.  Defer transition to apixaban, given history of intolerance in the past (abdominal pain).  Elevated troponin: Likely supply-demand mismatch.  No plans for ischemia  work-up in the setting of comorbidities.  COVID-19 infection: Minimally symptomatic.  Continue steroids and remdesivir.  Acute kidney injury superimposed on chronic kidney disease: Creatinine gradually improving.  Avoid nephrotoxic drugs.  Maintain net even fluid balance.  Left hand/finger pain: No deformity or significant edema on exam.  Continue acetaminophen for pain control.  Further work-up per primary team.  For questions or updates, please contact [place] Please consult [email or web address] for contact info under [facility].  Signed, Nelva Bush, MD  01/28/2020, 5:54 PM

## 2020-01-29 LAB — CULTURE, BLOOD (ROUTINE X 2)
Culture: NO GROWTH
Culture: NO GROWTH

## 2020-01-29 LAB — CBC WITH DIFFERENTIAL/PLATELET
Abs Immature Granulocytes: 0.19 10*3/uL — ABNORMAL HIGH (ref 0.00–0.07)
Basophils Absolute: 0 10*3/uL (ref 0.0–0.1)
Basophils Relative: 0 %
Eosinophils Absolute: 0 10*3/uL (ref 0.0–0.5)
Eosinophils Relative: 0 %
HCT: 39.5 % (ref 36.0–46.0)
Hemoglobin: 12.7 g/dL (ref 12.0–15.0)
Immature Granulocytes: 2 %
Lymphocytes Relative: 8 %
Lymphs Abs: 0.8 10*3/uL (ref 0.7–4.0)
MCH: 25.8 pg — ABNORMAL LOW (ref 26.0–34.0)
MCHC: 32.2 g/dL (ref 30.0–36.0)
MCV: 80.1 fL (ref 80.0–100.0)
Monocytes Absolute: 0.9 10*3/uL (ref 0.1–1.0)
Monocytes Relative: 9 %
Neutro Abs: 8.1 10*3/uL — ABNORMAL HIGH (ref 1.7–7.7)
Neutrophils Relative %: 81 %
Platelets: 127 10*3/uL — ABNORMAL LOW (ref 150–400)
RBC: 4.93 MIL/uL (ref 3.87–5.11)
RDW: 17.8 % — ABNORMAL HIGH (ref 11.5–15.5)
WBC: 9.9 10*3/uL (ref 4.0–10.5)
nRBC: 0.6 % — ABNORMAL HIGH (ref 0.0–0.2)

## 2020-01-29 LAB — C-REACTIVE PROTEIN: CRP: 3.7 mg/dL — ABNORMAL HIGH (ref <1.0)

## 2020-01-29 LAB — COMPREHENSIVE METABOLIC PANEL
ALT: 11 U/L (ref 0–44)
AST: 11 U/L — ABNORMAL LOW (ref 15–41)
Albumin: 2.5 g/dL — ABNORMAL LOW (ref 3.5–5.0)
Alkaline Phosphatase: 52 U/L (ref 38–126)
Anion gap: 6 (ref 5–15)
BUN: 62 mg/dL — ABNORMAL HIGH (ref 8–23)
CO2: 21 mmol/L — ABNORMAL LOW (ref 22–32)
Calcium: 8 mg/dL — ABNORMAL LOW (ref 8.9–10.3)
Chloride: 112 mmol/L — ABNORMAL HIGH (ref 98–111)
Creatinine, Ser: 1.76 mg/dL — ABNORMAL HIGH (ref 0.44–1.00)
GFR calc Af Amer: 29 mL/min — ABNORMAL LOW (ref >60)
GFR calc non Af Amer: 25 mL/min — ABNORMAL LOW (ref >60)
Glucose, Bld: 88 mg/dL (ref 70–99)
Potassium: 4.3 mmol/L (ref 3.5–5.1)
Sodium: 139 mmol/L (ref 135–145)
Total Bilirubin: 0.9 mg/dL (ref 0.3–1.2)
Total Protein: 5.7 g/dL — ABNORMAL LOW (ref 6.5–8.1)

## 2020-01-29 LAB — FIBRIN DERIVATIVES D-DIMER (ARMC ONLY): Fibrin derivatives D-dimer (ARMC): 1851.35 ng/mL (FEU) — ABNORMAL HIGH (ref 0.00–499.00)

## 2020-01-29 LAB — FERRITIN: Ferritin: 138 ng/mL (ref 11–307)

## 2020-01-29 LAB — MAGNESIUM: Magnesium: 2.5 mg/dL — ABNORMAL HIGH (ref 1.7–2.4)

## 2020-01-29 MED ORDER — TORSEMIDE 20 MG PO TABS: ORAL_TABLET | ORAL | 0 refills | Status: DC

## 2020-01-29 MED ORDER — IMBRUVICA 420 MG PO TABS: ORAL_TABLET | ORAL | 2 refills | Status: DC

## 2020-01-29 NOTE — Progress Notes (Signed)
Progress Note  Patient Name: Kristy Solomon Date of Encounter: 01/29/2020  Primary Cardiologist: Kate Sable, MD   Subjective   No acute events overnight.  Currently eating breakfast.  Denies palpitations or chest pain.  Inpatient Medications    Scheduled Meds: . allopurinol  200 mg Oral Daily  . vitamin C  1,000 mg Oral Daily  . aspirin EC  81 mg Oral Daily  . atorvastatin  20 mg Oral Daily  . cholecalciferol  1,000 Units Oral Daily  . dextromethorphan-guaiFENesin  1 tablet Oral BID  . gabapentin  300 mg Oral TID  . metoprolol succinate  75 mg Oral BID  . pantoprazole  40 mg Oral Daily  . Rivaroxaban  15 mg Oral Q supper  . zinc sulfate  220 mg Oral Daily   Continuous Infusions:  PRN Meds: acetaminophen, albuterol, alum & mag hydroxide-simeth, chlorpheniramine-HYDROcodone, diltiazem, guaiFENesin-dextromethorphan, hydrALAZINE, labetalol, magnesium hydroxide, traZODone   Vital Signs    Vitals:   01/28/20 1057 01/28/20 2202 01/29/20 0120 01/29/20 0906  BP: (!) 148/72 123/81 119/87 92/68  Pulse: 80 82 83 79  Resp:   19 16  Temp:   97.6 F (36.4 C) 98.4 F (36.9 C)  TempSrc:   Oral Axillary  SpO2:   99% 97%  Weight:      Height:       No intake or output data in the 24 hours ending 01/29/20 1020 Last 3 Weights 01/24/2020 01/09/2020 01/08/2020  Weight (lbs) 220 lb 200 lb 14.4 oz 202 lb 4.8 oz  Weight (kg) 99.791 kg 91.128 kg 91.763 kg      Telemetry    Currently off telemetry  ECG    No new tracing obtained- Personally Reviewed  Physical Exam   GEN: No acute distress.   Neck: No JVD Cardiac:  Irregular irregular, no murmurs, rubs, or gallops.  Respiratory:  Decreased breath sounds at bases. GI: Soft, nontender, non-distended  MS: No edema; No deformity. Neuro:  Nonfocal  Psych: Normal affect   Labs    High Sensitivity Troponin:   Recent Labs  Lab 12/31/19 1637 01/24/20 0106 01/24/20 0300 01/25/20 0723 01/26/20 0621  TROPONINIHS 49*  294* 256* 327* 261*      Chemistry Recent Labs  Lab 01/27/20 0534 01/28/20 0642 01/29/20 0531  NA 140 140 139  K 4.4 4.4 4.3  CL 112* 112* 112*  CO2 21* 20* 21*  GLUCOSE 137* 133* 88  BUN 62* 59* 62*  CREATININE 2.07* 1.71* 1.76*  CALCIUM 8.0* 8.3* 8.0*  PROT 5.7* 6.2* 5.7*  ALBUMIN 2.5* 2.7* 2.5*  AST 11* 11* 11*  ALT 12 13 11   ALKPHOS 46 54 52  BILITOT 1.0 1.0 0.9  GFRNONAA 21* 26* 25*  GFRAA 24* 30* 29*  ANIONGAP 7 8 6      Hematology Recent Labs  Lab 01/27/20 0534 01/28/20 0642 01/29/20 0531  WBC 12.7* 12.9* 9.9  RBC 4.79 5.16* 4.93  HGB 12.2 13.3 12.7  HCT 39.0 41.5 39.5  MCV 81.4 80.4 80.1  MCH 25.5* 25.8* 25.8*  MCHC 31.3 32.0 32.2  RDW 17.1* 17.1* 17.8*  PLT 122* 136* 127*    BNP Recent Labs  Lab 01/24/20 0106  BNP 262.0*     DDimer No results for input(s): DDIMER in the last 168 hours.   Radiology    No results found.  Cardiac Studies   TTE 12/2018 1. Left ventricular ejection fraction, by estimation, is 50 to 55%. The  left ventricle has low  normal function. The left ventricle has no regional  wall motion abnormalities. There is mild left ventricular hypertrophy.  Left ventricular diastolic  parameters are indeterminate.  2. Right ventricular systolic function is normal. The right ventricular  size is normal. Tricuspid regurgitation signal is inadequate for assessing  PA pressure.  3. Left atrial size was mildly dilated.  4. Right atrial size was mildly dilated.  5. The mitral valve is abnormal. Mild mitral valve regurgitation. No  evidence of mitral stenosis.  6. Tricuspid valve regurgitation is mild to moderate.  7. The aortic valve is tricuspid. Aortic valve regurgitation is not  visualized. No aortic stenosis is present.  8. The inferior vena cava is dilated in size with <50% respiratory  variability, suggesting right atrial pressure of 15 mmHg.  Patient Profile     86 y.o. female with history of heart failure  preserved ejection fraction, atrial fibrillation, hypertension, CKD being seen due to atrial fibrillation with rapid ventricular response and elevated troponins  Assessment & Plan    1.  Permanent atrial fibrillation -Heart rate controlled -Continue Xarelto 15mg  and Toprol-XL 75 twice daily  2.  Minimally elevated troponins -Secondary to demand supply mismatch, CKD and COVID-19 infection contributing -No further cardiac testing or intervention planned  3.  History of hypertension -Blood pressure controlled -Continue Toprol  4.  COVID-19 infection -Management as per medicine team   Please let us know if further cardiac input is needed.  Thank you for this consult.  Total encounter time 35 minutes  Greater than 50% was spent in counseling and coordination of care with the patient  [facility] will sign off.   Medication Recommendations: Toprol-XL and Xarelto at current dose Other recommendations (labs, testing, etc): Avoid nephrotoxic agents Follow up as an outpatient: Yes  For questions or updates, please contact [place] Please consult [email or web address] for contact info under        Signed, Kate Sable, MD  01/29/2020, 10:20 AM

## 2020-01-29 NOTE — Discharge Summary (Signed)
Physician Discharge Summary   Kristy Solomon  female DOB: 02-11-1929  LFY:[id number]  PCP: Jodi Marble, MD  Admit date: 01/24/2020 Discharge date: 01/29/2020  Admitted From: home Disposition:  SNF CODE STATUS: Full code  Discharge Instructions    Increase activity slowly   Complete by: As directed        Hospital Course:  For full details, please see H&P, progress notes, consult notes and ancillary notes.  Briefly,  Kristy Solomon  is a 86 y.o. African-American female with a known history of hypertension, dyslipidemia, malignant lymphoma and CKD, presented to the emergency room with acute onset of generalized abdominal pain, nausea and vomiting without bilious vomitus or hematemesis or diarrhea as well as hypothermia.    Pneumonia secondary to COVID-19 virus Patient had had many possible home exposures.  Continues to think that she does not have Covid.  She had radiographic evidence of pneumonia on chest x-ray.  Had not requiring any oxygen.  Pt finished 5 days of Remdesivir.  Steroid was not prescribed.  AKI, resolved CKD4 Baseline creatinine appears to be around 1.5-1.7.  Creatinine peaked at 2.46.  Cr 1.76 on the day of discharge.  Paroxysmal atrial fibrillation Continued metoprolol and Xarelto.  Abdominal pain, resolved CT with no acute findings.    Hyperlipidemia Continued statin  Elevated troponin Trop peaked at 327.  Likely demand ischemia. No chest pain or ischemic EKG changes.  History of gout No acute flare Continued allopurinol  Peripheral neuropathy Continued Neurontin  Malignant lymphoma Was under treatment with ibrutinib, which is on hold due to acute illness.  Pt needs close follow-up with oncology.  Goals of Care Palliative care consulted while inpatient.  After their discussion with the patient on 01/27/2020 as well as the patient's daughter via phone the patient was made a DO NOT RESUSCITATE.  However when the patient's  nurse went back into place a DNR bracelet on the patient stated that she did not want to be a DNR.  Had palliative care reevaluate and discussed with patient.  Will keep the patient full code for now.  Patient would likely benefit from outpatient palliative care follow-up.   Discharge Diagnoses:  Active Problems:   Acute kidney injury superimposed on chronic kidney disease ([place])   Pneumonia due to COVID-19 virus   Atrial fibrillation with rapid ventricular response [facility])   Palliative care by specialist    Discharge Instructions:  Allergies as of 01/29/2020      Reactions   Eliquis [apixaban]    abd pain      Medication List    STOP taking these medications   dextromethorphan-guaiFENesin 30-600 MG 12hr tablet Commonly known as: Powell DM     TAKE these medications   albuterol 108 (90 Base) MCG/ACT inhaler Commonly known as: VENTOLIN HFA Inhale 2 puffs into the lungs every 6 (six) hours as needed for wheezing or shortness of breath.   allopurinol 100 MG tablet Commonly known as: Zyloprim Take 2 tablets (200 mg total) by mouth daily.   atorvastatin 20 MG tablet Commonly known as: LIPITOR Take 20 mg by mouth daily.   gabapentin 300 MG capsule Commonly known as: NEURONTIN Take 300 mg by mouth 3 (three) times daily.   Imbruvica 420 MG Tabs Generic drug: Ibrutinib Hold until outpatient oncology followup What changed:   how much to take  how to take this  when to take this  additional instructions   metoprolol succinate 25 MG 24 hr tablet Commonly known as:  TOPROL-XL Take 3 tablets (75 mg total) by mouth 2 (two) times daily.   potassium chloride 10 MEQ CR capsule Commonly known as: MICRO-K Take 10 mEq by mouth in the morning.   Rivaroxaban 15 MG Tabs tablet Commonly known as: XARELTO Take 1 tablet (15 mg total) by mouth daily with supper.   torsemide 20 MG tablet Commonly known as: Demadex Hold until outpatient PCP followup due to AKI and low blood  pressure. What changed:   how much to take  how to take this  when to take this  additional instructions        Contact information for follow-up providers    [facility] Follow up on 02/07/2020.   Specialty: Cardiology Why: at 1:00pm. Enter through the [place] entrance Contact information: [place]       Jodi Marble, MD. Schedule an appointment as soon as possible for a visit in 1 week(s).   Specialty: Internal Medicine Contact information: [place] [address] [phone]        Kate Sable, MD .   Specialties: Cardiology, Radiology Contact information: [place] [address] [phone]        Patient's own outpatient oncologist. Schedule an appointment as soon as possible for a visit in 1 week(s).            Contact information for after-discharge care    Destination    [facility] .   Service: Skilled Nursing Contact information: [address] [place] [phone]                  Allergies  Allergen Reactions  . Eliquis [Apixaban]     abd pain     The results of significant diagnostics from this hospitalization (including imaging, microbiology, ancillary and laboratory) are listed below for reference.   Consultations:   Procedures/Studies: CT Abdomen Pelvis Wo Contrast  Result Date: 01/24/2020 CLINICAL DATA:  86 year old female with abdominal pain, nausea vomiting. History of lymphoma. EXAM: CT ABDOMEN AND PELVIS WITHOUT CONTRAST TECHNIQUE: Multidetector CT imaging of the abdomen and pelvis was performed following the standard protocol without IV contrast. COMPARISON:  CT Abdomen and Pelvis 07/12/2019. FINDINGS: Lower chest: Stable cardiomegaly. No pericardial effusion. Calcified aortic atherosclerosis. Mild lung  base pleural thickening or less likely trace pleural fluid. Chronic paraseptal emphysema and mild lung base architectural distortion, but new left greater than right lower lobe and right middle lobe widespread pulmonary ground-glass opacity (series 3, image 6). Mild peribronchial nodularity associated. Lower lung airways appear to remain patent. Hepatobiliary: Negative noncontrast liver and gallbladder. Mild motion artifact at the level of the gallbladder. Pancreas: Negative. Spleen: Negative. Adrenals/Urinary Tract: Adrenal glands are within normal limits. Bulky chronic right renal staghorn calculi. No acute right hydronephrosis or perinephric stranding. Stable appearance of the proximal right ureter which seems to taper distal to the renal pelvic staghorn calculus. Chronically abnormal left kidney with masslike soft tissue at the left renal hilum and bulky left renal lower pole 2 cm calculus. See series 2, image 31. This process appears stable since October. And there is associated chronic left periureteral stranding which abates at the pelvic inlet. The distal left ureter seems to remain normal. There are multiple chronic pelvic phleboliths. Stable and unremarkable urinary bladder. Stomach/Bowel: No dilated large or small bowel. There is severe diverticulosis of the descending colon, but no  convincing active inflammation. There is motion artifact in the mid abdomen. The cecum appears to beyond a lax mesentery and much of the right colon is decompressed. Decompressed terminal ileum. The stomach is mildly to moderately fluid distended. Unremarkable duodenum. No free air, free fluid, or convincing mesenteric stranding. Vascular/Lymphatic: Vascular patency is not evaluated in the absence of IV contrast. Extensive Aortoiliac calcified atherosclerosis. No lymphadenopathy identified in the absence of IV contrast. Reproductive: Surgically absent uterus and diminutive or absent ovaries. Other: No pelvic free fluid.  Musculoskeletal: Osteopenia. No acute osseous abnormality identified. IMPRESSION: 1. Bilateral lung base ground-glass opacity is new since October and suspicious for acute viral/atypical respiratory infection. Pulmonary edema is felt less likely, there is chronic cardiomegaly. 2. No superimposed acute or inflammatory process identified in the noncontrast abdomen or pelvis: - stable since October abnormal left kidney with mass-like soft tissue at the left renal hilum. Etiology uncertain but perhaps due to lymphoma. - bulky superimposed bilateral nephrolithiasis, staghorn calculi on the right. - diverticulosis of the descending colon but no active inflammation. 3. Aortic Atherosclerosis (ICD10-I70.0). Electronically Signed   By: Genevie Ann M.D.   On: 01/24/2020 05:10   DG Chest 1 View  Result Date: 01/07/2020 CLINICAL DATA:  Sudden onset of shortness of breath. EXAM: CHEST  1 VIEW COMPARISON:  01/06/2020 FINDINGS: There is a worsening right lower lobe airspace opacity. There is a persistent dense retrocardiac opacity. There is cardiomegaly with prominent interstitial lung markings. There is no pneumothorax. There may be small bilateral pleural effusions. Aortic calcifications are noted. IMPRESSION: 1. Worsening bibasilar airspace opacities concerning for pneumonia. 2. Cardiomegaly with vascular congestion. Electronically Signed   By: Constance Holster M.D.   On: 01/07/2020 22:03   DG Chest Port 1 View  Result Date: 01/24/2020 CLINICAL DATA:  Abdominal pain. Episode of vomiting. EXAM: PORTABLE CHEST 1 VIEW COMPARISON:  01/07/2020, CT 07/12/2019 FINDINGS: Unchanged cardiomegaly. Aortic atherosclerosis and tortuosity. Development of patchy opacity in the right upper lung zone. Worsening opacities at the bases. No pneumothorax. Limited assessment for pleural effusion given habitus. IMPRESSION: 1. Development of patchy opacity in the right upper lung zone, may represent pneumonia. Asymmetric pulmonary edema is also  considered. 2. Worsening nonspecific basilar opacities since radiographs earlier this month. 3. Stable cardiomegaly.  Aortic Atherosclerosis (ICD10-I70.0). Electronically Signed   By: Keith Rake M.D.   On: 01/24/2020 01:35   DG Chest Port 1 View  Result Date: 01/06/2020 CLINICAL DATA:  Shortness of breath. EXAM: PORTABLE CHEST 1 VIEW COMPARISON:  01/02/2020 FINDINGS: Persistent enlargement of the cardiac silhouette. Aortic atherosclerosis. Increased atelectasis/infiltrate at the left lung base. The right lung is clear. Pulmonary vascularity is normal. No acute bone abnormality. IMPRESSION: Increasing atelectasis/infiltrate at the left lung base. Aortic Atherosclerosis (ICD10-I70.0). Electronically Signed   By: Lorriane Shire M.D.   On: 01/06/2020 15:00   DG Chest Port 1 View  Result Date: 01/02/2020 CLINICAL DATA:  Short of breath fluid overload EXAM: PORTABLE CHEST 1 VIEW COMPARISON:  12/31/2019 FINDINGS: Cardiac enlargement. Negative for heart failure or edema. No significant pleural effusion. Left lower lobe atelectasis/infiltrate unchanged. Mild right lower lobe airspace disease unchanged. IMPRESSION: Negative for heart failure or edema. Bibasilar airspace disease left greater than right unchanged may represent atelectasis or possibly pneumonia Electronically Signed   By: Franchot Gallo M.D.   On: 01/02/2020 08:34   DG Chest Portable 1 View  Result Date: 12/31/2019 CLINICAL DATA:  Leg swelling, CHF, shortness of breath. Additional history provided: EXAM: PORTABLE CHEST 1  VIEW COMPARISON:  CT chest 07/12/2019, chest radiograph 12/28/2017 FINDINGS: Unchanged cardiomegaly. Aortic atherosclerosis. Central pulmonary vascular congestion. Ill-defined opacities at the bilateral lung bases. A trace left pleural effusion may be present. No evidence of pneumothorax. No acute bony abnormality. IMPRESSION: Cardiomegaly with central pulmonary vascular congestion. Ill-defined opacities at the bilateral lung  bases may reflect atelectasis and/or previously demonstrated scarring. Pneumonia is difficult to definitively exclude. Possible trace left pleural effusion. Aortic atherosclerosis. Electronically Signed   By: Kellie Simmering DO   On: 12/31/2019 07:24   ECHOCARDIOGRAM COMPLETE  Result Date: 12/31/2019    ECHOCARDIOGRAM REPORT   Patient Name:   ALLANA SHRESTHA Date of Exam: 12/31/2019 Medical Rec #:  [id number]        Height:       64.0 in Accession #:    [id number]       Weight:       210.0 lb Date of Birth:  Sep 29, 1929        BSA:          1.997 m Patient Age:    59 years         BP:           124/79 mmHg Patient Gender: F                HR:           93 bpm. Exam Location:  [facility] Procedure: 2D Echo, Color Doppler and Cardiac Doppler Indications:     I50.31 CHF-Acute Diastolic  History:         Patient has prior history of Echocardiogram examinations, most                  recent 10/10/2019. Risk Factors:Hypertension and Dyslipidemia.  Sonographer:     Charmayne Sheer RDCS (AE) Referring Phys:  Baker Janus Soledad Gerlach NIU Diagnosing Phys: Nelva Bush MD  Sonographer Comments: Suboptimal subcostal window. IMPRESSIONS  1. Left ventricular ejection fraction, by estimation, is 50 to 55%. The left ventricle has low normal function. The left ventricle has no regional wall motion abnormalities. There is mild left ventricular hypertrophy. Left ventricular diastolic parameters are indeterminate.  2. Right ventricular systolic function is normal. The right ventricular size is normal. Tricuspid regurgitation signal is inadequate for assessing PA pressure.  3. Left atrial size was mildly dilated.  4. Right atrial size was mildly dilated.  5. The mitral valve is abnormal. Mild mitral valve regurgitation. No evidence of mitral stenosis.  6. Tricuspid valve regurgitation is mild to moderate.  7. The aortic valve is tricuspid. Aortic valve regurgitation is not visualized. No aortic stenosis is present.  8. The inferior vena cava is dilated in size  with <50% respiratory variability, suggesting right atrial pressure of 15 mmHg. FINDINGS  Left Ventricle: Left ventricular ejection fraction, by estimation, is 50 to 55%. The left ventricle has low normal function. The left ventricle has no regional wall motion abnormalities. The left ventricular internal cavity size was normal in size. There is mild left ventricular hypertrophy. Left ventricular diastolic parameters are indeterminate. Right Ventricle: The right ventricular size is normal. No increase in right ventricular wall thickness. Right ventricular systolic function is normal. Tricuspid regurgitation signal is inadequate for assessing PA pressure. Left Atrium: Left atrial size was mildly dilated. Right Atrium: Right atrial size was mildly dilated. Pericardium: Trivial pericardial effusion is present. Mitral Valve: The mitral valve is abnormal. Mild mitral valve regurgitation. No evidence of mitral valve stenosis. MV peak gradient, 5.8 mmHg.  The mean mitral valve gradient is 2.0 mmHg. Tricuspid Valve: The tricuspid valve is normal in structure. Tricuspid valve regurgitation is mild to moderate. Aortic Valve: The aortic valve is tricuspid. . There is mild thickening of the aortic valve. Aortic valve regurgitation is not visualized. No aortic stenosis is present. There is mild thickening of the aortic valve. Aortic valve mean gradient measures 5.0 mmHg. Aortic valve peak gradient measures 9.7 mmHg. Aortic valve area, by VTI measures 1.42 cm. Pulmonic Valve: The pulmonic valve was normal in structure. Pulmonic valve regurgitation is not visualized. No evidence of pulmonic stenosis. Aorta: The aortic root is normal in size and structure. Pulmonary Artery: The pulmonary artery is of normal size. Venous: The inferior vena cava is dilated in size with less than 50% respiratory variability, suggesting right atrial pressure of 15 mmHg. IAS/Shunts: The interatrial septum was not well visualized.  LEFT VENTRICLE PLAX 2D  LVIDd:         5.21 cm  Diastology LVIDs:         3.85 cm  LV e' lateral:   7.83 cm/s LV PW:         1.15 cm  LV E/e' lateral: 15.6 LV IVS:        0.69 cm  LV e' medial:    6.85 cm/s LVOT diam:     1.90 cm  LV E/e' medial:  17.8 LV SV:         45 LV SV Index:   22 LVOT Area:     2.84 cm  RIGHT VENTRICLE RV Basal diam:  3.58 cm LEFT ATRIUM             Index       RIGHT ATRIUM           Index LA diam:        4.90 cm 2.45 cm/m  RA Area:     19.30 cm LA Vol (A2C):   79.3 ml 39.70 ml/m RA Volume:   45.80 ml  22.93 ml/m LA Vol (A4C):   71.8 ml 35.95 ml/m LA Biplane Vol: 77.1 ml 38.60 ml/m  AORTIC VALVE                    PULMONIC VALVE AV Area (Vmax):    1.91 cm     PV Vmax:       0.95 m/s AV Area (Vmean):   1.81 cm     PV Vmean:      65.600 cm/s AV Area (VTI):     1.42 cm     PV VTI:        0.140 m AV Vmax:           156.00 cm/s  PV Peak grad:  3.6 mmHg AV Vmean:          108.000 cm/s PV Mean grad:  2.0 mmHg AV VTI:            0.315 m AV Peak Grad:      9.7 mmHg AV Mean Grad:      5.0 mmHg LVOT Vmax:         105.00 cm/s LVOT Vmean:        69.000 cm/s LVOT VTI:          0.158 m LVOT/AV VTI ratio: 0.50  AORTA Ao Root diam: 2.90 cm MITRAL VALVE MV Area (PHT): 4.63 cm     SHUNTS MV Peak grad:  5.8 mmHg     Systemic VTI:  0.16 m MV Mean grad:  2.0 mmHg     Systemic Diam: 1.90 cm MV Vmax:       1.20 m/s MV Vmean:      66.3 cm/s MV Decel Time: 164 msec MV E velocity: 122.00 cm/s Nelva Bush MD Electronically signed by Nelva Bush MD Signature Date/Time: 12/31/2019/1:08:53 PM    Final       Labs: BNP (last 3 results) Recent Labs    12/31/19 0648 01/24/20 0106  BNP 203.0* 329.5*   Basic Metabolic Panel: Recent Labs  Lab 01/25/20 0723 01/26/20 0621 01/27/20 0534 01/28/20 0642 01/29/20 0531  NA 142 142 140 140 139  K 4.2 5.1 4.4 4.4 4.3  CL 108 110 112* 112* 112*  CO2 22 23 21* 20* 21*  GLUCOSE 118* 144* 137* 133* 88  BUN 36* 50* 62* 59* 62*  CREATININE 1.71* 2.12* 2.07* 1.71* 1.76*    CALCIUM 8.5* 8.6* 8.0* 8.3* 8.0*  MG 2.2 2.3 2.3 2.4 2.5*   Liver Function Tests: Recent Labs  Lab 01/25/20 0723 01/26/20 0621 01/27/20 0534 01/28/20 0642 01/29/20 0531  AST 18 16 11* 11* 11*  ALT 12 13 12 13 11   ALKPHOS 54 51 46 54 52  BILITOT 0.9 1.0 1.0 1.0 0.9  PROT 6.6 6.3* 5.7* 6.2* 5.7*  ALBUMIN 3.0* 2.8* 2.5* 2.7* 2.5*   Recent Labs  Lab 01/24/20 0106  LIPASE 37   No results for input(s): AMMONIA in the last 168 hours. CBC: Recent Labs  Lab 01/25/20 0723 01/26/20 0621 01/27/20 0534 01/28/20 0642 01/29/20 0531  WBC 12.3* 14.4* 12.7* 12.9* 9.9  NEUTROABS 10.5* 12.7* 10.8* 11.0* 8.1*  HGB 13.4 12.9 12.2 13.3 12.7  HCT 42.0 40.0 39.0 41.5 39.5  MCV 80.8 79.8* 81.4 80.4 80.1  PLT 141* 142* 122* 136* 127*   Cardiac Enzymes: No results for input(s): CKTOTAL, CKMB, CKMBINDEX, TROPONINI in the last 168 hours. BNP: Invalid input(s): POCBNP CBG: No results for input(s): GLUCAP in the last 168 hours. D-Dimer No results for input(s): DDIMER in the last 72 hours. Hgb A1c No results for input(s): HGBA1C in the last 72 hours. Lipid Profile No results for input(s): CHOL, HDL, LDLCALC, TRIG, CHOLHDL, LDLDIRECT in the last 72 hours. Thyroid function studies No results for input(s): TSH, T4TOTAL, T3FREE, THYROIDAB in the last 72 hours.  Invalid input(s): FREET3 Anemia work up Recent Labs    01/28/20 0642 01/29/20 0531  FERRITIN 156 138   Urinalysis    Component Value Date/Time   COLORURINE AMBER (A) 01/24/2020 0106   APPEARANCEUR HAZY (A) 01/24/2020 0106   APPEARANCEUR Cloudy (A) 12/24/2019 1354   LABSPEC 1.024 01/24/2020 0106   LABSPEC 1.019 04/12/2014 0111   PHURINE 5.0 01/24/2020 0106   GLUCOSEU NEGATIVE 01/24/2020 0106   GLUCOSEU Negative 04/12/2014 0111   HGBUR SMALL (A) 01/24/2020 0106   BILIRUBINUR SMALL (A) 01/24/2020 0106   BILIRUBINUR Negative 12/24/2019 1354   BILIRUBINUR Negative 04/12/2014 0111   KETONESUR NEGATIVE 01/24/2020 0106    PROTEINUR >=300 (A) 01/24/2020 0106   NITRITE NEGATIVE 01/24/2020 0106   LEUKOCYTESUR TRACE (A) 01/24/2020 0106   LEUKOCYTESUR 2+ 04/12/2014 0111   Sepsis Labs Invalid input(s): PROCALCITONIN,  WBC,  LACTICIDVEN Microbiology Recent Results (from the past 240 hour(s))  Urine culture     Status: None   Collection Time: 01/24/20  1:06 AM   Specimen: Urine, Random  Result Value Ref Range Status   Specimen Description   Final    URINE, RANDOM Performed at [facility], [address] [place], [place] [address]    Special Requests   Final    NONE Performed at [facility], [address] [place], [place] [address]    Culture   Final    NO GROWTH Performed at [facility], [place] [address], [place], [place] [address]    Report Status 01/25/2020 FINAL  Final  Culture, blood (routine x 2)     Status: None   Collection Time: 01/24/20  1:07 AM   Specimen: BLOOD  Result Value Ref Range Status   Specimen Description BLOOD RAC  Final   Special Requests BOTTLES DRAWN AEROBIC AND ANAEROBIC BCAV  Final   Culture   Final    NO GROWTH 5 DAYS Performed at [facility], [address], [place], [place] [address]    Report Status 01/29/2020 FINAL  Final  Culture, blood (routine x 2)     Status: None   Collection Time: 01/24/20  1:07 AM   Specimen: BLOOD  Result Value Ref Range Status   Specimen Description BLOOD LEFT HAND  Final   Special Requests BOTTLES DRAWN AEROBIC AND ANAEROBIC BCAV  Final   Culture   Final    NO GROWTH 5 DAYS Performed at [facility], [address], [place], [place] [address]    Report Status 01/29/2020 FINAL  Final  Respiratory Panel by RT PCR (Flu A&B, Covid) - Nasopharyngeal Swab     Status: Abnormal   Collection Time: 01/24/20  1:11 AM   Specimen: Nasopharyngeal Swab  Result Value Ref Range Status   SARS Coronavirus 2 by RT PCR POSITIVE (A) NEGATIVE Final    Comment: RESULT CALLED TO, READ BACK BY AND VERIFIED  WITHBarbette Merino RN [phone] 4/32/21 HNM (NOTE) SARS-CoV-2 target nucleic acids are DETECTED. SARS-CoV-2 RNA is generally detectable in upper respiratory specimens  during the acute phase of infection. Positive results are indicative of the presence of the identified virus, but do not rule out bacterial infection or co-infection with other pathogens not detected by the test. Clinical correlation with patient history and other diagnostic information is necessary to determine patient infection status. The expected result is Negative. Fact Sheet for Patients:  PinkCheek.be Fact Sheet for Healthcare Providers: GravelBags.it This test is not yet approved or cleared by the [place] FDA and  has been authorized for detection and/or diagnosis of SARS-CoV-2 by FDA under an Emergency Use Authorization (EUA).  This EUA will remain in effect (meaning this test can be used) for t he duration of  the COVID-19 declaration under Section 564(b)(1) of the Act, 21 U.S.C. section 360bbb-3(b)(1), unless the authorization is terminated or revoked sooner.    Influenza A by PCR NEGATIVE NEGATIVE Final   Influenza B by PCR NEGATIVE NEGATIVE Final    Comment: (NOTE) The Xpert Xpress SARS-CoV-2/FLU/RSV assay is intended as an aid in  the diagnosis of influenza from Nasopharyngeal swab specimens and  should not be used as a sole basis for treatment. Nasal washings and  aspirates are unacceptable for Xpert Xpress SARS-CoV-2/FLU/RSV  testing. Fact Sheet for Patients: PinkCheek.be Fact Sheet for Healthcare Providers: GravelBags.it This test is not yet approved or cleared by the [place] FDA and  has been authorized for detection and/or diagnosis of SARS-CoV-2 by  FDA under an Emergency Use Authorization (EUA). This EUA will remain  in effect (meaning this test can be used) for the duration  of the  Covid-19 declaration under Section 564(b)(1) of  the Act, 21  U.S.C. section 360bbb-3(b)(1), unless the authorization is  terminated or revoked. Performed at [facility], [address] [place], [place] [address]      Total time spend on discharging this patient, including the last patient exam, discussing the hospital stay, instructions for ongoing care as it relates to all pertinent caregivers, as well as preparing the medical discharge records, prescriptions, and/or referrals as applicable, is 35 minutes.    Enzo Bi, MD  [facility] 01/29/2020, 10:19 AM  If 7PM-7AM, please contact night-coverage

## 2020-01-29 NOTE — TOC Progression Note (Signed)
Transition of Care [facility]) - Progression Note    Patient Details  Name: Kristy Solomon MRN: [id number] Date of Birth: June 09, 1929  Transition of Care [facility]) CM/SW Contact  Shelbie Hutching, RN Phone Number: 01/29/2020, 12:49 AM  Clinical Narrative:    SNF auth approved from [facility] ,  Reference # [id number]- approved for 3 days, start date 4/26, next review 4/28.    Expected Discharge Plan: [place] Barriers to Discharge: Continued Medical Work up  Expected Discharge Plan and Services Expected Discharge Plan: [place]   Discharge Planning Services: CM Consult Post Acute Care Choice: [place] arrangements for the past 2 months: Single Family Home                           HH Arranged: RN, Nurse's Aide, Social Work [employer] Agency: [facility] [facility] [employer] Date [place]: 01/27/20 Time [place]: 1104 Representative spoke with at [place]: [place] ([place]) Interventions    Readmission Risk Interventions No flowsheet data found.

## 2020-01-29 NOTE — TOC Transition Note (Signed)
Transition of Care [facility]) - CM/SW Discharge Note   Patient Details  Name: Kristy Solomon MRN: [id number] Date of Birth: May 15, 1929  Transition of [facility]) CM/SW Contact:  Shelbie Hutching, RN Phone Number: 01/29/2020, 1:12 PM   Clinical Narrative:    This RNCM has arranged transport to [employer] with [facility]- patient is 5th on the list for pick up.     Final next level of care: Skilled Nursing Facility Barriers to Discharge: Barriers Resolved   Patient Goals and CMS Choice Patient states their goals for this hospitalization and ongoing recovery are:: Patient agrees to SNF for short term rehab CMS Medicare.gov Compare Post Acute Care list provided to:: Patient Represenative (must comment) Choice offered to / list presented to : Adult Children(daughter [name])  Discharge Placement   Existing PASRR number confirmed : 01/27/20          Patient chooses bed at: [facility] Patient to be transferred to facility by: [facility] Name of family member notified: [name] Patient and family notified of of transfer: 01/29/20  Discharge Plan and Services   Discharge Planning Services: CM Consult Post Acute Care Choice: Home Health                    HH Arranged: RN, Nurse's Aide, Social Work [employer] Agency: [facility] [employer] Date [place]: 01/27/20 Time [place]: 1104 Representative spoke with at [place]: [place] ([place]) Interventions     Readmission Risk Interventions No flowsheet data found.

## 2020-01-29 NOTE — TOC Transition Note (Signed)
Transition of Care [facility]) - CM/SW Discharge Note   Patient Details  Name: LAVAYA DEFREITAS MRN: [id number] Date of Birth: 01-12-1929  Transition of Care [facility]) CM/SW Contact:  Shelbie Hutching, RN Phone Number: 01/29/2020, 10:11 AM   Clinical Narrative:    Patient will discharge to [facility] today for short term rehab.  Patient and daughter have been updated and are in agreement with discharge plan.  Bedside RN will call report to [facility] at [phone].  This RNCM will set up EMS transport.    Final next level of care: Skilled Nursing Facility Barriers to Discharge: Barriers Resolved   Patient Goals and CMS Choice Patient states their goals for this hospitalization and ongoing recovery are:: Patient agrees to SNF for short term rehab CMS Medicare.gov Compare Post Acute Care list provided to:: Patient Represenative (must comment) Choice offered to / list presented to : Adult Children(daughter [name])  Discharge Placement   Existing PASRR number confirmed : 01/27/20          Patient chooses bed at: [facility] Patient to be transferred to facility by: [facility] Name of family member notified: [name] Patient and family notified of of transfer: 01/29/20  Discharge Plan and Services   Discharge Planning Services: CM Consult Post Acute Care Choice: Home Health                    HH Arranged: RN, Nurse's Aide, Social Work [employer] Agency: [facility] [employer] Date [place]: 01/27/20 Time [place]: 1104 Representative spoke with at [place]: [place] ([place]) Interventions     Readmission Risk Interventions No flowsheet data found.

## 2020-01-29 NOTE — Plan of Care (Signed)
EMS just picked up pt to take to [facility].  Gave them a call to give them a heads up.

## 2020-01-29 NOTE — NC FL2 (Signed)
[place] LEVEL OF CARE SCREENING TOOL     IDENTIFICATION  Patient Name: Kristy Solomon Birthdate: Nov 15, 1928 Sex: female Admission Date (Current Location): 01/24/2020  [place] and [place] Number:  Engineering geologist and Address:  [facility], [address], [place], [place] [address]      Provider Number: [id number]  Attending Physician Name and Address:  Enzo Bi, MD  Relative Name and Phone Number:  [name] [phone]    Current Level of Care: Hospital Recommended Level of Care: [place] Prior Approval Number:    Date Approved/Denied:   PASRR Number: [id number]  Discharge Plan: SNF    Current Diagnoses: Patient Active Problem List   Diagnosis Date Noted  . Palliative care by specialist   . Atrial fibrillation with rapid ventricular response ([place])   . Pneumonia due to COVID-19 virus 01/24/2020  . Renal mass, left 01/13/2020  . Persistent atrial fibrillation ([place])   . Chronic kidney disease, stage 3b   . Acute on chronic heart failure with preserved ejection fraction (HFpEF) ([place]) 01/06/2020  . Acute decompensated heart failure ([place]) 01/03/2020  . Acute on chronic diastolic CHF (congestive heart failure) ([place]) 12/31/2019  . CKD (chronic kidney disease), stage IV ([place]) 12/31/2019  . Atrial fibrillation, chronic ([place]) 12/31/2019  . Elevated troponin 12/31/2019  . Renal insufficiency 07/28/2019  . Bilateral edema of lower extremity 05/07/2019  . HLD (hyperlipidemia) 09/18/2018  . HTN (hypertension) 09/18/2018  . GERD (gastroesophageal reflux disease) 09/18/2018  . Back pain 09/18/2018  . Bradycardia 09/18/2018  . Bigeminy 09/18/2018  . Nephrolithiasis 01/11/2018  . Goals of care, counseling/discussion 12/28/2017  . Gastroenteritis 12/28/2017  . B12 deficiency 03/18/2016  . Folate deficiency 03/18/2016  . Iron deficiency anemia 03/18/2016  . Pneumonia 03/17/2016  . Pressure ulcer 03/17/2016  .  Thrombocytopenia ([place]) 07/04/2014  . Acute kidney injury superimposed on chronic kidney disease ([place]) 07/04/2014  . Hyperuricemia 07/04/2014  . 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 Waldenstrom macroglobulinemia ([place]) 12/27/2013  . Calculi, ureter 07/03/2012  . Frank hematuria 07/03/2012  . Hydronephrosis 07/03/2012  . 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 Calculus of kidney 07/02/2012  . Nonspecific finding on examination of urine 07/02/2012    Orientation RESPIRATION BLADDER Height & Weight     Self, Time, Situation, Place  Normal Continent Weight: 99.8 kg Height:  5\' 4"  (162.6 cm)  BEHAVIORAL SYMPTOMS/MOOD NEUROLOGICAL BOWEL NUTRITION STATUS      Continent Diet  AMBULATORY STATUS COMMUNICATION OF NEEDS Skin   Supervision Verbally Normal                       Personal Care Assistance Level of Assistance  Bathing, Feeding, Dressing Bathing Assistance: Limited assistance Feeding assistance: Limited assistance Dressing Assistance: Limited assistance     Functional Limitations Info             SPECIAL CARE FACTORS FREQUENCY  PT (By licensed PT), OT (By licensed OT)     PT Frequency: 2-3 times per week OT Frequency: 2-3 times per week            Contractures Contractures Info: Not present    Additional Factors Info  Isolation Precautions, Code Status, Allergies Code Status Info: Full Allergies Info: Eliquis     Isolation Precautions Info: Airborne- COVID     Current Medications (01/29/2020):  This is the current hospital active medication list Current Facility-Administered Medications  Medication Dose Route Frequency Provider Last Rate Last Admin  . acetaminophen (TYLENOL) tablet 650 mg  650 mg Oral Q6H PRN Mansy, Jan A, MD   650 mg at 01/29/20 0645  . albuterol (VENTOLIN HFA) 108 (90 Base) MCG/ACT  inhaler 2 puff  2 puff Inhalation Q6H PRN Mansy, Jan A, MD      . allopurinol (ZYLOPRIM) tablet 200 mg  200 mg Oral Daily Mansy, Jan A, MD   200 mg at 01/28/20 1057  . alum & mag hydroxide-simeth (MAALOX/MYLANTA) 200-200-20 MG/5ML suspension 30 mL  30 mL Oral Q4H PRN Priscella Mann, Sudheer B, MD      . ascorbic acid (VITAMIN C) tablet 1,000 mg  1,000 mg Oral Daily Mansy, Jan A, MD   1,000 mg at 01/28/20 1058  . aspirin EC tablet 81 mg  81 mg Oral Daily Mansy, Jan A, MD   81 mg at 01/28/20 1058  . atorvastatin (LIPITOR) tablet 20 mg  20 mg Oral Daily Mansy, Jan A, MD   20 mg at 01/28/20 1746  . chlorpheniramine-HYDROcodone (TUSSIONEX) 10-8 MG/5ML suspension 5 mL  5 mL Oral Q12H PRN Mansy, Jan A, MD      . cholecalciferol (VITAMIN D3) tablet 1,000 Units  1,000 Units Oral Daily Ralene Muskrat B, MD   1,000 Units at 01/28/20 1058  . dextromethorphan-guaiFENesin (MUCINEX DM) 30-600 MG per 12 hr tablet 1 tablet  1 tablet Oral BID Mansy, Arvella Merles, MD   1 tablet at 01/28/20 2202  . diltiazem (CARDIZEM) injection 10 mg  10 mg Intravenous Q4H PRN Ralene Muskrat B, MD   10 mg at 01/24/20 1138  . gabapentin (NEURONTIN) capsule 300 mg  300 mg Oral TID Mansy, Jan A, MD   300 mg at 01/28/20 2202  . guaiFENesin-dextromethorphan (ROBITUSSIN DM) 100-10 MG/5ML syrup 10 mL  10 mL Oral Q4H PRN Mansy, Jan A, MD      . hydrALAZINE (APRESOLINE) injection 10 mg  10 mg Intravenous Q4H PRN Ralene Muskrat B, MD   10 mg at 01/28/20 0022  . labetalol (NORMODYNE) injection 20 mg  20 mg Intravenous Q3H PRN Mansy, Jan A, MD      . magnesium hydroxide (MILK OF MAGNESIA) suspension 30 mL  30 mL Oral Daily PRN Mansy, Jan A, MD      . metoprolol succinate (TOPROL-XL) 24 hr tablet 75 mg  75 mg Oral BID Mansy, Jan A, MD   75 mg at 01/28/20 2202  . pantoprazole (PROTONIX) EC tablet 40 mg  40 mg Oral Daily Pernell Dupre, RPH   40 mg at 01/28/20 1058  . Rivaroxaban (XARELTO) tablet 15 mg  15 mg Oral Q supper Mansy, Jan A, MD   15 mg at  01/28/20 1745  . traZODone (DESYREL) tablet 25 mg  25 mg Oral QHS PRN Mansy, Jan A, MD   25 mg at 01/27/20 2135  . zinc sulfate capsule 220 mg  220 mg Oral Daily Ralene Muskrat B, MD   220 mg at 01/28/20 1058   Facility-Administered Medications Ordered in Other Encounters  Medication Dose Route Frequency Provider Last Rate Last Admin  . cyanocobalamin ((VITAMIN B-12)) injection 1,000 mcg  1,000 mcg Intramuscular Once Lequita Asal, MD         Discharge Medications: Please see discharge summary for a list of discharge medications.  Relevant Imaging Results:  Relevant Lab Results:   Additional Information SS# [id number]  Shelbie Hutching, RN

## 2020-01-29 NOTE — Progress Notes (Signed)
[facility]) Hospital Liaison RN note   Referral received for outpatient palliative care to be followed by [facility] at discharge.  Please call with any questions or concerns.  Thank you. Margaretmary Eddy, BSN, RN [facility] Liaison [phone]

## 2020-01-29 NOTE — Discharge Planning (Signed)
Patient IV removed.  Discharge packet printed and placed in facility packet.  No printed scripts needed at this time.  Report called and s/w Theodoro Parma, LPN at [facility].  Daughter aware of DC and EMS contacted for transport. Waiting on arrival.

## 2020-01-29 NOTE — Progress Notes (Signed)
Palliative:  HPI: 86 y.o.femalewith past medical history of hypertension, atrial fibrillation on Xarelto, dyslipidemia, malignant lymphoma on ibrutinib, CKD stage 3b,admitted on 4/23/2021with abd pain and admitted with multifocal pneumonia secondary to COVID-19 infection.Also under treatment for UTI and atrial fibrillation.  I met again today with Kristy Solomon. She is alert today and talking on the phone. I hear her say "I'm not going to no rehab." I attempted to have a conversation with her but she is very distracted with having me help her move up in the bed and asking about lunch (her uneaten breakfast tray is in front of her that I point out). I asked her about going to rehab and tried to remind her that I discussed this with her yesterday but she does not remember. Very forgetful and I have found that she does not recall any conversations from day to day and therefore her decisions fluctuate. Given this severe forgetfulness I do not feel that she is able to make decisions for herself.   I assisted her in calling daughter, Kristy Solomon, and now Kristy Solomon again agrees with going to rehab. I also touched base with [name] who is overwhelmed with trying to keep all the information and phone calls straight for her mother's care. She tells me that she is glad I recognize because she has been trying to explain to everyone that her mother does not remember anything. At this time Kristy Solomon is to d/c to SNF rehab today and I will request outpatient palliative to follow with her and continue conversations.   All questions/concerns addressed. Emotional support provided. Discussed with RN and CMRN and suggest if any further confusion that daughter, Kristy Solomon, is called to speak with her mother to explain and help.   Exam: Alert, mostly oriented but extremely forgetful. No distress. Breathing regular, unlabored. Abd soft. Generalized weakness.   Plan: - To SNF rehab with palliative to follow.   25 min  [name], NP Palliative Medicine Team Pager [phone] (Please see [email or web address] for schedule) Team Phone [phone]    Greater than 50%  of this time was spent counseling and coordinating care related to the above assessment and plan

## 2020-01-30 ENCOUNTER — Encounter: Payer: Self-pay | Admitting: Physician Assistant

## 2020-01-30 ENCOUNTER — Ambulatory Visit: Payer: Self-pay | Admitting: Physician Assistant

## 2020-02-04 ENCOUNTER — Ambulatory Visit: Payer: Self-pay | Admitting: Physician Assistant

## 2020-02-04 ENCOUNTER — Telehealth: Payer: Self-pay | Admitting: Family

## 2020-02-04 ENCOUNTER — Ambulatory Visit: Payer: Medicare HMO

## 2020-02-04 NOTE — Telephone Encounter (Signed)
LVM on 5/3 and 5/4 regarding patients new patient appointment with the [facility]F Clinic after her recent diagnosis.    Kristy Solomon, [place]

## 2020-02-06 ENCOUNTER — Inpatient Hospital Stay: Payer: Medicare HMO | Admitting: Hematology and Oncology

## 2020-02-07 ENCOUNTER — Ambulatory Visit: Payer: Medicare HMO | Admitting: Family

## 2020-02-10 ENCOUNTER — Inpatient Hospital Stay: Payer: Medicare HMO

## 2020-02-14 ENCOUNTER — Non-Acute Institutional Stay: Payer: Medicare HMO | Admitting: Adult Health Nurse Practitioner

## 2020-02-14 ENCOUNTER — Other Ambulatory Visit: Payer: Self-pay

## 2020-02-14 DIAGNOSIS — C8590 Non-Hodgkin lymphoma, unspecified, unspecified site: Secondary | ICD-10-CM

## 2020-02-14 DIAGNOSIS — Z515 Encounter for palliative care: Secondary | ICD-10-CM

## 2020-02-14 NOTE — Progress Notes (Deleted)
[facility]  [address], [address] [place], [place] [address] Phone: [phone]  Fax: [phone]   Clinic Day:  02/14/2020  Referring physician: Jodi Marble, MD  Chief Complaint: Kristy Solomon is a 86 y.o. female with Waldenstrom's macroglobulinemia/lymphoplasmocytic lymphoma and B12 deficiency who is seen for review of imaging and discussion regarding direction of therapy.   HPI: The patient was last seen in the medical oncology clinic on 01/13/2020. At that time, she was fatigued. She had struggled with lower extremity edema. She denied any fevers or sweats. Exam revealed no adenopathy or SQ nodularity. We discussed follow-up chest, abdomen and pelvis CT. Patient received a B12 injection.   Labs revealed a hematocrit 42.8, hemoglobin 13.4, platelets 86,000, WBC 4,200 (ALC 600). Ferritin was 277 with an iron saturation of 11% and a TIBC of 272. Creatinine was 1.40 and albumin 3.1. Folate was 11.2. Retic was 0.9%. Uric acid was 7.6. LDH was 270.  M-spike was 0.6 gm/dL.  IgM was 950.  She was admitted to [facility] from 01/24/2020 - 01/29/2020 for generalized abdominal pain, nausea and vomiting, and hypothermia.  She had pneumonia secondary to COVID-19.  She did not require any oxygen. Patient finished 5 days of Remdesivir. Steroids were not prescribed.  Baseline creatinine was 1.5-1.7.  Creatinine peaked at 2.46. Creatinine was 1.76 on the day of discharge.  Troponin peaked at 327 felt secondary to demand ischemia.  EKG on 01/24/2020 revealed atrial fibrillation.  She continued metoprolol and Xarelto. Ibrutinib was held due to her acute illness.    Abdomen and pelvic CT on 01/24/2020 revealed bilateral lung base ground-glass opacity suspicious for acute viral/atypical respiratory infection. Pulmonary edema was felt less likely.  There was chronic cardiomegaly.  There was no superimposed acute or inflammatory process identified in the noncontrast abdomen or pelvis:   There was an abnormal left kidney with mass-like soft tissue at the left renal hilum (stable).  There was bulky superimposed bilateral nephrolithiasis, staghorn calculi on the right and diverticulosis of the descending colon.  Chest CT on 02/18/2020 revealed subpleural reticulation/scarring and irregular septal thickening which was new (nonspecific and favored postinflammatory fibrosis related to [place] or possibly drug toxicity).  Pulmonary lymphoma was less likely but is difficult to exclude.  There were new small bilateral pleural effusions.  There was no suspicious lymphadenopathy in the chest.  During the interim,***   Past Medical History:  Diagnosis Date  . 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 Hyperlipidemia   . Hypertension   . Malignant lymphoma, lymphoplasmacytic ([place]) 12/27/2013  . Renal insufficiency     Past Surgical History:  Procedure Laterality Date  . ABDOMINAL HYSTERECTOMY    . HEMORRHOID SURGERY      Family History  Problem Relation Age of Onset  . Cancer Brother        throat ca  . Cancer Brother        bone cancer    Social History:  reports that she has never smoked. She has never used smokeless tobacco. She reports that she does not drink alcohol or use drugs. She lives in [place]. Her emergency contact is Cecille Po, her grandaughter- [phone]). [name]'s phone number [phone]). She lives in [place]. She has transportation issues. She states that her grandson's wife can assist with travel (needs to be arranged). The patient is alone today.  Allergies:  Allergies  Allergen Reactions  . Eliquis [Apixaban]     abd pain    Current Medications: Current Outpatient  Medications  Medication Sig Dispense Refill  . albuterol (VENTOLIN HFA) 108 (90 Base) MCG/ACT inhaler Inhale 2 puffs into the lungs every 6 (six) hours as needed for wheezing or shortness of breath. 8 g 0  . allopurinol (ZYLOPRIM) 100 MG tablet Take 2 tablets (200 mg  total) by mouth daily. 180 tablet 1  . atorvastatin (LIPITOR) 20 MG tablet Take 20 mg by mouth daily.    Marland Kitchen gabapentin (NEURONTIN) 300 MG capsule Take 300 mg by mouth 3 (three) times daily.     . Ibrutinib (IMBRUVICA) 420 MG TABS Hold until outpatient oncology followup 28 tablet 2  . metoprolol succinate (TOPROL-XL) 25 MG 24 hr tablet Take 3 tablets (75 mg total) by mouth 2 (two) times daily. 60 tablet 0  . potassium chloride (MICRO-K) 10 MEQ CR capsule Take 10 mEq by mouth in the morning.     . Rivaroxaban (XARELTO) 15 MG TABS tablet Take 1 tablet (15 mg total) by mouth daily with supper. 42 tablet 0  . torsemide (DEMADEX) 20 MG tablet Hold until outpatient PCP followup due to AKI and low blood pressure. 30 tablet 0   No current facility-administered medications for this visit.   Facility-Administered Medications Ordered in Other Visits  Medication Dose Route Frequency Provider Last Rate Last Admin  . cyanocobalamin ((VITAMIN B-12)) injection 1,000 mcg  1,000 mcg Intramuscular Once Lequita Asal, MD        Review of Systems  Constitutional: Negative for chills, diaphoresis, fever, malaise/fatigue and weight loss (no new weight).       Feels "sleepy". Performing ADLs.  HENT: Negative.  Negative for congestion, ear discharge, ear pain, nosebleeds, sinus pain, sore throat and tinnitus.   Eyes: Negative.  Negative for blurred vision, double vision, photophobia, pain, discharge and redness.  Respiratory: Positive for cough and sputum production. Negative for hemoptysis and shortness of breath.        On 2 liters/min of supplemental oxygen.  Cardiovascular: Positive for leg swelling (chronic BLE). Negative for chest pain, palpitations, orthopnea, claudication and PND.       Atrial fibrillation.  Gastrointestinal: Negative.  Negative for abdominal pain, blood in stool, constipation, diarrhea, heartburn, melena, nausea and vomiting.       No appetite.  Genitourinary: Positive for hematuria  (occasional; mild). Negative for dysuria, frequency and urgency.       H/o nephrolithiasis.  LEFT renal mass. H/o hematuria.  Musculoskeletal: Negative.  Negative for back pain, falls, joint pain, myalgias and neck pain.       H/o gout with questionable recent flare.  Skin: Negative.  Negative for itching and rash.       No SQ nodules.  Neurological: Negative.  Negative for dizziness, tingling, tremors, sensory change, speech change, focal weakness, weakness and headaches.  Endo/Heme/Allergies: Negative.  Does not bruise/bleed easily.  Psychiatric/Behavioral: Negative for depression and memory loss. The patient has insomnia. The patient is not nervous/anxious.   All other systems reviewed and are negative.  Performance status (ECOG): 0  Vitals There were no vitals taken for this visit.   Physical Exam  Constitutional: She is oriented to person, place, and time. She appears well-developed and well-nourished. No distress.  Fatigued appearing woman sitting comfortably in wheelchair with portable oxygen.  HENT:  Head: Normocephalic and atraumatic.  Mouth/Throat: Oropharynx is clear and moist. No oropharyngeal exudate.  Black croquet cap.  Shoulder-length gray hair.  [place] in place.  Mask.  Eyes: Pupils are equal, round, and reactive to light. Conjunctivae  and EOM are normal. No scleral icterus.  Glasses.  Brown eyes.  Neck: No JVD present.  Cardiovascular: An irregular rhythm present.  No murmur heard. Pulmonary/Chest: Effort normal and breath sounds normal. No respiratory distress. She has no wheezes. She has no rales. She exhibits no tenderness.  Abdominal: Soft. Bowel sounds are normal. She exhibits no distension and no mass. There is no abdominal tenderness. There is no rebound and no guarding.  Musculoskeletal:        General: Tenderness (L>R) and edema (chronic bilateral lower extremity; R>L) present. Normal range of motion.     Cervical back: Normal range of motion and neck supple.    Lymphadenopathy:       Head (right side): No preauricular, no posterior auricular and no occipital adenopathy present.       Head (left side): No preauricular, no posterior auricular and no occipital adenopathy present.    She has no cervical adenopathy.    She has no axillary adenopathy.       Right: No supraclavicular adenopathy present.       Left: No supraclavicular adenopathy present.  Neurological: She is alert and oriented to person, place, and time.  Skin: Skin is warm and dry. No rash noted. She is not diaphoretic. No erythema. No pallor.  No palpable SQ masses.  Psychiatric: She has a normal mood and affect. Her behavior is normal. Judgment and thought content normal.  Nursing note and vitals reviewed.   Imaging studies: 03/17/2016:Chest, abdomen, and pelvisCTrevealed progression of diffuse lymphoma with subcutaneous deposits throughout the subcutaneous fat of the chest, abdomen,and pelvis. There were multiple nodular infiltrates throughout the lungs, likely due to lymphoma but could also represent infection or atypical infection. There was massive retroperitoneal lymphadenopathy with diffuse retroperitoneal, mesenteric, and pelvic lymphadenopathy. There was wall thickening and pelvic small bowel probably representing lymphomas involvement. There was probable direct invasion of the left kidney. 04/21/2016:Chest, abdomen, and pelvisCTrevealed a partial response to therapy. There were bilateral pulmonary nodules/masses, measuring up to 5.6 cm in the right lower lobe (improved). There was trace left pleural effusion. There was a 12.7 cm left renal/perirenal mass (decreased) with associated mild left hydronephrosis. Retroperitoneal/left pelvic lymphadenopathy was mildly decreased. Multifocal subcutaneous nodules, measuring up to 4.5 cm in the left lower anterior abdominal wall were stable to mildly improved. 12/28/2017:Abdomen and pelvisCTwas concerning for small bowel  obstruction with transitional zone at the distal ileum. There was no associated mass. LEFT renal massmeasured 6.1 x 5.0 cm (previously 13.7 x 10.7 cm). Suspect combination of mass and adenopathy due to lymphoma in the left adrenal. This area was much less pronounced than on prior study. There was a 1.7 x 1.2 cm lymph node in the RIGHT external iliac node chain. Subcutaneous deposits bilaterally were considerably smaller compared to 2017. There were sizable calculi(2.0 x 1.7 cm, 2.6 x 2.0 cm) in each kidney without obstruction. 07/11/2018:Chest, abdomen, and pelvisCTrevealed a hypodense 3.6 cm inferior LEFT thyroid nodule (stable), new subpleural medial 0.8 cm apical RUL nodule, new subpleural anterior 0.5 cm LUL nodule, basilar LLL mass measuring 4.6 x 3.3 cm (previously 4.2 x 3.2 cm) and a basilar RLL nodule measuring 1.4 cm (previously 0.6 cm). The LEFT upper breast mass measuring 2.4 x 2.0 cm (previously 3.8 x 2.1 cm). There were superficial RIGHT chest wall masses measuring up to 2.3 x 0.9 cm (previously 4.1 x 1.6 cm). There were several bulky stones in the RIGHT renal pelvis measuring up to 22 x 11 mm, and  a non-obstructing 13 mm LEFT renal stone. There was a coarsely calcified 2.3 cm anterior interpolar RIGHT renal cortical lesion, and a simple 1.8 cm anterior RIGHT renal cyst. The infiltrative soft tissue density, replacing the LEFT renal sinus and partially encasing the kidney/vascular pedicle, spanned up to 10.2 x 6.5 cm. 07/12/2019: Chest, abdomen, and pelvis CTrevealed stable left breast lesion. The other subcutaneous lesions involving the right chest wall haddecreased in size since the prior examination. There were no new or progressive findings.There was improved CT appearance of the lungs.There was persistent patchy airspace opacities and scarring type changes but no discrete mass or nodule.Here was no mediastinal or hilar adenopathy.There was stable soft tissue density  infiltrating the left kidney and left renal sinus and vascular pedicle region.There was stable bilateral renal calculi.There was no abdominal/pelvic lymphadenopathy or inguinal adenopathy.There was stable severe atherosclerotic disease. 01/24/2020:  Abdomen and pelvic CT revealed bilateral lung base ground-glass opacity suspicious for acute viral/atypical respiratory infection. Pulmonary edema was felt less likely.  There was chronic cardiomegaly.  There was no superimposed acute or inflammatory process identified in the noncontrast abdomen or pelvis:  There was an abnormal left kidney with mass-like soft tissue at the left renal hilum (stable).  There was bulky superimposed bilateral nephrolithiasis, staghorn calculi on the right and diverticulosis of the descending colon. 02/18/2020:  Chest CT revealed subpleural reticulation/scarring and irregular septal thickening which was new (nonspecific and favored postinflammatory fibrosis related to COVID or possibly drug toxicity).  Pulmonary lymphoma was less likely but is difficult to exclude.  There were new small bilateral pleural effusions.  There was no suspicious lymphadenopathy in the chest.   No visits with results within 3 Day(s) from this visit.  Latest known visit with results is:  No results displayed because visit has over 200 results.      Assessment:  Kristy Solomon is a 86 y.o. 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 She presented with massive subcutaneous deposits, pulmonary nodules, involvement of left kidney, and bowel. Disease was unresponsive toRituxanin 2014.   Chest, abdomen, and pelvisCTon 03/17/2016 revealed progression of diffuse lymphoma with subcutaneous deposits throughout the subcutaneous fat of the chest, abdomen,and pelvis. There were multiple nodular infiltrates throughout the lungs, likely due to lymphoma but could also represent infection or atypical infection.  There was massive retroperitoneal lymphadenopathy with diffuse retroperitoneal, mesenteric, and pelvic lymphadenopathy. There was wall thickening and pelvic small bowel probably representing lymphomas involvement. There was probable direct invasion of the left kidney.  Biopsyof the dominantSQ nodal conglomerationaround the right flank on 03/18/2016 revealed a persistent low-grade CD5 positive B-cell lymphoma. Flow cytometry was positive for CD20, CD79a, CD5, CD138. Neoplastic cells were positive for kappa and cyclin D1. CD 23 was negative. Additional molecular markers are pending.  Bone marrowaspirate and biopsy on 03/18/2016 revealed no evidence of lymphoma or plasma cell neoplasm (0.2% kappa monoclonal plasma cells by flow analysis). Marrow was hypercellular for age (50-60%) with trilineage hematopoiesis and no increased blasts. There was mild to moderate widespread increase in reticulin fibers. There was no storage iron detected.  The following studies were abnormal: Beta2-microglobulin was 9.6 (0.6-2.4). Serum viscosity was 5.5 (1.6-1.9). Uric acid was 11.1 on 03/19/2016 and 5.7 on 05/23/2016.   Normalstudies included: hepatitis B surface antigen, hepatitis B core antibody total, hepatitis C antibody, HIV testing, and LDH (123). G6PD assay was 9.2 (4.6-13.5).   She has iron deficiency(ferritin 27; iron saturation 11%),B12 deficiency (147), and folate deficiency(5.9). She has chronic renal insufficiency (BUN  was 30-35, creatinine was 1.38 - 1.80 with a GFR 29-39 ml/min). She began B12 injectionson 03/28/2016 (last 01/13/2020). She is on folic acid. Folate was 11.2 on 01/13/2020.  She received 1 week of chlorambucilwith a tapering dose of prednisone(began 03/30/2016). Cortisol was 9.5 on 05/17/2016. She began ibrutinib on 04/26/2016. She has mild thrombocytopenia.   SPEPhas been followed: 5.0 gm/dL on 03/18/2016, 4.5 on 04/25/2016, 2.3 on 06/20/2016, 2.0 on  10/24/2016, 1.5 on 12/19/2016, 1.4 on 03/13/2017, 1.1 on 07/01/2017, 1.3 on 01/08/2018, 0.9 on 04/12/2018, 1.0 on 07/06/2018, 0.8 on 10/21/2019, and 0.6 on 01/13/2020.  IgM has been followed: >5850 on 03/18/2016, >5850 on 04/25/2016, 4652 on 06/09/2016, 3719 on 07/18/2016, 3429 on 10/24/2016, 2821 on 12/19/2016, 2295 on 03/13/2017, 2500 on 06/30/2017, 1890 09/29/2017, 1769 on 01/08/2018, 1244 on 04/12/2018, 1509 on 07/06/2018, 1567 on 09/11/2018, 1281 on 01/16/2019, 1094 on 05/01/2019, 1088 on 10/21/2019, and 950 on 01/13/2020.  Serum viscosityhas been followed: 5.5 on 03/18/2016 and 1.6 on 09/11/2018.  Abdomen and pelvis CT on 01/24/2020 revealed bilateral lung base ground-glass opacity uspicious for acute viral/atypical respiratory infection. There was an abnormal left kidney with mass-like soft tissue at the left renal hilum. There was bulky superimposed bilateral nephrolithiasis, staghorn calculi on the right.  There was diverticulosis of the descending colon and aortic atherosclerosis. Chest CT on 02/18/2020 revealed subpleural reticulation/scarring and irregular septal thickening which was new (nonspecific and favored postinflammatory fibrosis related to [place] or possibly drug toxicity).  Pulmonary lymphoma was less likely but is difficult to exclude.  There were new small bilateral pleural effusions.  There was no suspicious lymphadenopathy in the chest.  She was admitted to [facility] from 01/24/2020 to 01/29/2020 for generalized abdominal pain, nausea and vomiting, and hypothermia. Creatinine peaked at 2.46 (baseline 1.5-1.7).  She had pneumonia secondary to COVID-19.  She did not require any oxygen. She received 5 days of Remdesivir. Steroids were not prescribed. brutinib was held.   She has atrial fibrillation.  CHA2DS2-VASc score is 1 (age, HTN, gender).She is on Xarelto 15 mg a day.  Symptomatically, ***   Plan: 1.     2. Waldenstrm's macroglobulinemia Clinically, she  appears to be doing well on ibrutinib. Exam reveals no adenopathy or SQ nodularity. Chest, abdomen,pelvic CT scan on 07/12/2019 revealed the left breast lesion as well as the SQlesions in the right chest wall had decreased.  LDH has increased from 160 to 270. IgM is 950 and M-spike is 0.8 gm/dL (improved).  Discuss restaging CT scans without contrast secondary to renal function.  Continue ibrutinib. 3.Thrombocytopenia Hematocrit 40.3, hemoglobin 12.6, platelets 91,000,WBC4800 ([place] 2600) on 05/01/2019. Hematocrit 42.8, hemoglobin 12.9, platelets 87,000,WBC4700 ([place] 2500)on 07/22/2019.       Hematocrit 44.0, hemoglobin 13.9, platelets 89,000,WBC3700 ([facility] 2400)on 10/21/2019.  Hematocrit 42.8, hemoglobin 13.4, platelets 86,000,WBC 4200 ([place] 2900)on 01/13/2020.  Overall, platelet count is stable.  Continue to monitor. 4. LEFT renal mass Imaging studies in 07/12/2019 revealed a stable soft tissue density infiltrating the left kidney and left renal sinus as well as vascular pedicle region. Mass encasing the left kidneyis c/wWaldenstrm's. Interval hematuria without UTI symptoms (unclear if related to mass). Discuss imaging. 5.Renal insufficiency Creatinine1.40 today (improved).  Creatinine 1.31-1.74 last 4 months. She has known left renal involvement with Waldenstrom's as well as nephrolithiasis. Patient is followed by Dr Juleen China. Avoid IV contrast secondary to renal function. Recent increase in diuretics.  Continue to monitor. 6.Atrial fibrillation Patient switched from Eliquis to Xarelto per cardiology.  Monitor closely secondary to renal function. 7. B12 deficiency LastB12 on03/15/2021. B12 today and monthly x 6. 8. Folate deficiency Folate 11.2 on 01/13/2020. Continue  to monitor. 9.   Schedule chest, abdomen, and pelvis CT on 04/19/202. 10.   RTC after imaging for MD assessment, review of imaging, and discussion regarding direction of therapy.   I discussed the assessment and treatment plan with the patient.  The patient was provided an opportunity to ask questions and all were answered.  The patient agreed with the plan and demonstrated an understanding of the instructions.  The patient was advised to call back if the symptoms worsen or if the condition fails to improve as anticipated.  I provided *** minutes of face-to-face time during this this encounter and > 50% was spent counseling as documented under my assessment and plan.  I provided these services from the [facility] office ***.   Lequita Asal, MD, PhD    02/14/2020, 1:32 PM  I, Selena Batten, am acting as scribe for [employer]. Mike Gip, MD, PhD.  I, Sehaj Mcenroe C. Mike Gip, MD, have reviewed the above documentation for accuracy and completeness, and I agree with the above.

## 2020-02-14 NOTE — Progress Notes (Signed)
[facility] Consult Note Telephone: [phone]  Fax: [phone]  PATIENT NAME: Kristy Solomon DOB: 01/27/1929 MRN: [id number]  PRIMARY CARE PROVIDER:   Jodi Marble, MD  REFERRING PROVIDER:  Dr. Aubery Lapping  RESPONSIBLE PARTY:   Avelino Leeds, daughter C: [phone]   H:  [place], grandson [phone]  RECOMMENDATIONS and PLAN:  1.  Advanced care planning.  Patient is full code.  Discussed this more today and she states she wants what can be done to help prolong her life.  Called daughter to update on visit.  Left VM with reason for call and contact info for call back.  2.  COVID pneumonia/CHF.  Patient was hospitalized 4/23-428/21 for COVID pneumonia.  She also had hospitalization 3/30-01/09/20 for CHF exacerbation. She uses O2 @ 2L at night.  She is not wearing O2 at this time but she does state that she will put it on when she gets SOB, though this is unchanged from prior to having COVID.  She sleeps elevated with 2-3 pillows but she states she has done this for a long time. Since having COVID she is having fatigue and more forgetfulness.  She denies any increased SOB or cough but states that she gets tired easily and feels weak.  She has 2+ edema of bilateral legs but she states this is improved.  She is on torsemide 20 mg daily. She is working with therapy at the facility and desires to go back home.  Notes from palliative consult in hospital indicate that daughter has had concerns about her mother staying alone prior to her hospitalizations.  Concerns that she may not be taking her meds like she should and that she may not be eating well.  Patient states her appetite is good with no reported weight loss.  Prior to hospitalization she did not use walker.  She is encouraged to use walker for her safety at the facility but she thinks she does not need it.  Continue supportive care and therapy at facility  Will have to  have further discussions with patient and daughter about goals of care upon discharge from the facility.  Staff and patient report no new concerns today.  Palliative will continue to monitor for symptom management/decline and make recommendations as needed  I spent 50 minutes providing this consultation,  from 11:15 to 12:05 including time spent with patient, chart review, provider coordination, documentation. More than 50% of the time in this consultation was spent coordinating communication.   HISTORY OF PRESENT ILLNESS:  Kristy Solomon is a 86 y.o. year old female with multiple medical problems including lymphoma, CHF, HTN, HLD, renal insufficiency, recent COVID pneumonia. Palliative Care was asked to help address goals of care.   CODE STATUS: full code  PPS: 40% HOSPICE ELIGIBILITY/DIAGNOSIS: TBD  PHYSICAL EXAM:  HR  62  O2 99% on RA General: NAD, frail appearing Cardiovascular: regular rate and rhythm Pulmonary: lung sounds diminished but no abnormal breath sounds heard; normal respiratory effort Abdomen:obese, soft, nontender, + bowel sounds GU: no suprapubic tenderness Extremities: 2+ edema to bilateral lower extremities, no joint deformities Skin: no rashes on exposed skin Neurological: Weakness but otherwise nonfocal  PAST MEDICAL HISTORY:  Past Medical History:  Diagnosis Date  . 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 Hyperlipidemia   . Hypertension   . 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 Renal insufficiency     SOCIAL HX:  Social History   Tobacco Use  .  Smoking status: Never Smoker  . Smokeless tobacco: Never Used  Substance Use Topics  . Alcohol use: No    ALLERGIES:  Allergies  Allergen Reactions  . Eliquis [Apixaban]     abd pain     PERTINENT MEDICATIONS:  Outpatient Encounter Medications as of 02/14/2020  Medication Sig  . albuterol (VENTOLIN HFA) 108 (90 Base) MCG/ACT inhaler Inhale 2 puffs into the lungs every 6 (six) hours  as needed for wheezing or shortness of breath.  . allopurinol (ZYLOPRIM) 100 MG tablet Take 2 tablets (200 mg total) by mouth daily.  Marland Kitchen atorvastatin (LIPITOR) 20 MG tablet Take 20 mg by mouth daily.  Marland Kitchen gabapentin (NEURONTIN) 300 MG capsule Take 300 mg by mouth 3 (three) times daily.   . Ibrutinib (IMBRUVICA) 420 MG TABS Hold until outpatient oncology followup  . metoprolol succinate (TOPROL-XL) 25 MG 24 hr tablet Take 3 tablets (75 mg total) by mouth 2 (two) times daily.  . potassium chloride (MICRO-K) 10 MEQ CR capsule Take 10 mEq by mouth in the morning.   . Rivaroxaban (XARELTO) 15 MG TABS tablet Take 1 tablet (15 mg total) by mouth daily with supper.  . torsemide (DEMADEX) 20 MG tablet Hold until outpatient PCP followup due to AKI and low blood pressure.   Facility-Administered Encounter Medications as of 02/14/2020  Medication  . cyanocobalamin ((VITAMIN B-12)) injection 1,000 mcg      Meredith Kilbride Jenetta Downer, NP

## 2020-02-17 ENCOUNTER — Inpatient Hospital Stay: Payer: Medicare HMO | Admitting: Hematology and Oncology

## 2020-02-17 ENCOUNTER — Inpatient Hospital Stay: Payer: Medicare HMO

## 2020-02-17 ENCOUNTER — Telehealth: Payer: Self-pay

## 2020-02-18 ENCOUNTER — Other Ambulatory Visit: Payer: Self-pay

## 2020-02-18 ENCOUNTER — Ambulatory Visit
Admission: RE | Admit: 2020-02-18 | Discharge: 2020-02-18 | Disposition: A | Discharge status: Home / Self Care | Payer: Medicare HMO | Source: Ambulatory Visit | Attending: Hematology and Oncology | Admitting: Hematology and Oncology

## 2020-02-18 DIAGNOSIS — C880 Waldenstrom macroglobulinemia: Secondary | ICD-10-CM | POA: Diagnosis present

## 2020-02-18 DIAGNOSIS — C8300 Small cell B-cell lymphoma, unspecified site: Secondary | ICD-10-CM | POA: Diagnosis present

## 2020-02-21 ENCOUNTER — Inpatient Hospital Stay: Payer: Medicare HMO | Attending: Hematology and Oncology | Admitting: Hematology and Oncology

## 2020-02-21 ENCOUNTER — Telehealth: Payer: Self-pay | Admitting: *Deleted

## 2020-02-21 NOTE — Progress Notes (Signed)
Patient ID: Kristy Solomon, female    DOB: 1929/08/13, 86 y.o.   MRN: [id number]  HPI  Kristy Solomon is a 86 y/o female with a history of malignant lymphoma, hyperlipidemia, HTN, CKD, anemia and chronic heart failure.   Echo report from 12/31/19 reviewed and showed an EF of 50-55% along with mild LVH, mild MR and mild/moderate TR.   Admitted 01/24/20 due to pneumonia due to [place]. Cardiology and palliative care consults obtained. Received 5 days of remdesivir. Abdominal CT was negative. Elevated troponin thought to be due to demand ischemia. Discharged after 5 days. Admitted 12/31/19 due to acute on chronic heart failure. Cardiology consult obtained. Antibiotics given for atelectasis. Initially given IV lasix and then transitioned to oral diuretics. Elevated troponin thought to be due to demand ischemia. Discharged after 9 days. Was in the ED 12/16/19 due to hematuria where she was evaluated and released.   She presents today for her initial visit with a chief complaint of minimal fatigue upon moderate exertion. She describes this as chronic in nature having been present for several years. She has associated pedal edema, leg pain and chronic difficulty sleeping along with this. She denies any dizziness, abdominal distention, palpitations, chest pain, shortness of breath, cough or weight gain.   Currently at [facility] and says that they don't give her any salt but she takes it off the cart and adds "a little" to her food. Says that she drinks 3-4 glasses of water every morning and every afternoon but is unsure of the size of the glass. Patient and daughter have conflicting answers at time.   Past Medical History:  Diagnosis Date  . 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 CHF (congestive heart failure) ([place])   . Hyperlipidemia   . Hypertension   . 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 Renal insufficiency    Past Surgical History:  Procedure Laterality Date  . ABDOMINAL  HYSTERECTOMY    . HEMORRHOID SURGERY     Family History  Problem Relation Age of Onset  . Cancer Brother        throat ca  . Cancer Brother        bone cancer   Social History   Tobacco Use  . Smoking status: Never Smoker  . Smokeless tobacco: Never Used  Substance Use Topics  . Alcohol use: No   Allergies  Allergen Reactions  . Eliquis [Apixaban]     abd pain   Prior to Admission medications   Medication Sig Start Date End Date Taking? Authorizing Provider  albuterol (VENTOLIN HFA) 108 (90 Base) MCG/ACT inhaler Inhale 2 puffs into the lungs every 6 (six) hours as needed for wheezing or shortness of breath. 01/09/20  Yes Fritzi Mandes, MD  allopurinol (ZYLOPRIM) 100 MG tablet Take 2 tablets (200 mg total) by mouth daily. 09/11/18  Yes Karen Kitchens, NP  atorvastatin (LIPITOR) 20 MG tablet Take 20 mg by mouth daily. 12/24/13  Yes [provider]  gabapentin (NEURONTIN) 300 MG capsule Take 300 mg by mouth 3 (three) times daily.    Yes [provider]  metoprolol succinate (TOPROL-XL) 25 MG 24 hr tablet Take 3 tablets (75 mg total) by mouth 2 (two) times daily. 01/09/20  Yes Fritzi Mandes, MD  potassium chloride (MICRO-K) 10 MEQ CR capsule Take 10 mEq by mouth in the morning.  04/25/19  Yes [provider]  Rivaroxaban (XARELTO) 15 MG TABS tablet Take 1 tablet (15 mg total) by mouth daily  with supper. 01/09/20  Yes Fritzi Mandes, MD  torsemide (DEMADEX) 20 MG tablet Hold until outpatient PCP followup due to AKI and low blood pressure. Patient taking differently: Take 20 mg by mouth daily.  01/29/20  Yes Enzo Bi, MD  Ibrutinib (IMBRUVICA) [place] until outpatient oncology followup Patient not taking: Reported on 02/24/2020 01/29/20   Enzo Bi, MD     Review of Systems  Constitutional: Positive for fatigue. Negative for appetite change.  HENT: Negative for congestion, postnasal drip and sore throat.   Eyes: Negative.   Respiratory: Negative for cough,  shortness of breath and wheezing.   Cardiovascular: Positive for leg swelling. Negative for chest pain and palpitations.  Gastrointestinal: Negative for abdominal distention and abdominal pain.  Endocrine: Negative.   Genitourinary: Negative.   Musculoskeletal: Positive for arthralgias (leg pain at times). Negative for neck pain.  Skin: Negative.   Allergic/Immunologic: Negative.   Neurological: Negative for dizziness and light-headedness.  Hematological: Negative for adenopathy. Does not bruise/bleed easily.  Psychiatric/Behavioral: Positive for sleep disturbance (sleeping on 2-3 pillows). Negative for dysphoric mood. The patient is not nervous/anxious.     Vitals:   02/24/20 1140  BP: (!) 118/58  Pulse: (!) 58  Resp: 16  Weight: 210 lb (95.3 kg)  Height: 5\' 4"  (1.626 m)   Wt Readings from Last 3 Encounters:  02/24/20 210 lb (95.3 kg)  01/24/20 220 lb (99.8 kg)  01/09/20 200 lb 14.4 oz (91.1 kg)   Lab Results  Component Value Date   CREATININE 1.76 (H) 01/29/2020   CREATININE 1.71 (H) 01/28/2020   CREATININE 2.07 (H) 01/27/2020     Physical Exam Vitals and nursing note reviewed.  Constitutional:      Appearance: Normal appearance.  HENT:     Head: Normocephalic and atraumatic.  Cardiovascular:     Rate and Rhythm: Regular rhythm. Bradycardia present.  Pulmonary:     Effort: Pulmonary effort is normal. No respiratory distress.     Breath sounds: No wheezing or rales.  Abdominal:     General: There is no distension.     Palpations: Abdomen is soft.  Musculoskeletal:        General: No tenderness.     Cervical back: Normal range of motion and neck supple.     Right lower leg: Edema (2+ pitting) present.     Left lower leg: Edema (2+ pitting) present.  Skin:    General: Skin is warm and dry.  Neurological:     General: No focal deficit present.     Mental Status: She is alert and oriented to person, place, and time.  Psychiatric:        Mood and Affect: Mood  normal.        Behavior: Behavior normal.    Assessment & Plan:  1: Chronic heart failure with preserved ejection fraction with structural changes- - NYHA class II - euvolemic today - being weighed daily and order written for them [facility]) to call for an overnight weight gain of >2 pounds or a weekly weight gain of >5 pounds - does add "some salt" and she was encouraged to not add any salt to her food - saw cardiology ([facility]) 11/18/19 & f/u appointment was scheduled with him for 2 months - bradycardic today in the office but not bradycardic per [facility] vital sign record - BNP 01/24/20 was 262.0  2: HTN- - BP looks good today - seeing PCP at [facility] 01/29/20 reviewed  and showed sodium 139, potassium 4.3, creatinine 1.76 and GFR 29  3: Malignant Lymphoma- - saw oncologist Mike Gip) 01/13/20  4: Lymphedema- - stage 2 - patient says that she elevates her legs often but daughter says that patient doesn't; encouraged her to elevate her legs when sitting for long periods of time; explained that we are limited in diuretic usage because of her kidney function - order written for staff to put compression socks on daily with removal at bedtime - consider lymphapress compression boots if edema persists  Facility medication list was reviewed.   Return in 2 months or sooner for any questions/problems before then.

## 2020-02-21 NOTE — Telephone Encounter (Signed)
Spoke to patient's daughter [name] via telephone re: patient did not show up for her follow up visit with Dr. Mike Gip this morning.   Patient was discharged from the hospital on January 29, 2020 and transferred to SNF at [facility] in [place], [place]. Tried to contact [employer] without success. Phone just rang over and over, but there was no answer and no voicemail  Patient's daughter will try to contact [facility] and give them our contact information to reschedule patient's appointment.

## 2020-02-24 ENCOUNTER — Other Ambulatory Visit: Payer: Self-pay

## 2020-02-24 ENCOUNTER — Encounter: Payer: Self-pay | Admitting: Family

## 2020-02-24 ENCOUNTER — Ambulatory Visit: Payer: Medicare HMO | Attending: Family | Admitting: Family

## 2020-02-24 VITALS — BP 118/58 | HR 58 | Resp 16 | Ht 64.0 in | Wt 210.0 lb

## 2020-02-24 DIAGNOSIS — N189 Chronic kidney disease, unspecified: Secondary | ICD-10-CM | POA: Diagnosis not present

## 2020-02-24 DIAGNOSIS — C8590 Non-Hodgkin lymphoma, unspecified, unspecified site: Secondary | ICD-10-CM | POA: Diagnosis not present

## 2020-02-24 DIAGNOSIS — I130 Hypertensive heart and chronic kidney disease with heart failure and stage 1 through stage 4 chronic kidney disease, or unspecified chronic kidney disease: Secondary | ICD-10-CM | POA: Insufficient documentation

## 2020-02-24 DIAGNOSIS — M79606 Pain in leg, unspecified: Secondary | ICD-10-CM | POA: Insufficient documentation

## 2020-02-24 DIAGNOSIS — R5383 Other fatigue: Secondary | ICD-10-CM | POA: Diagnosis present

## 2020-02-24 DIAGNOSIS — I10 Essential (primary) hypertension: Secondary | ICD-10-CM

## 2020-02-24 DIAGNOSIS — I890 Lymphedema, not elsewhere classified: Secondary | ICD-10-CM | POA: Insufficient documentation

## 2020-02-24 DIAGNOSIS — I5032 Chronic diastolic (congestive) heart failure: Secondary | ICD-10-CM | POA: Diagnosis not present

## 2020-02-24 DIAGNOSIS — Z79899 Other long term (current) drug therapy: Secondary | ICD-10-CM | POA: Diagnosis not present

## 2020-02-24 DIAGNOSIS — Z7901 Long term (current) use of anticoagulants: Secondary | ICD-10-CM | POA: Insufficient documentation

## 2020-02-24 DIAGNOSIS — E785 Hyperlipidemia, unspecified: Secondary | ICD-10-CM | POA: Insufficient documentation

## 2020-02-24 NOTE — Progress Notes (Signed)
[facility] - PHARMACIST COUNSELING NOTE  ADHERENCE ASSESSMENT  Adherence strategy: Patient is a resident at [facility]. Facility responsible for administering medications.    Do you ever forget to take your medication? [] Yes (1) [x] No (0)  Do you ever skip doses due to side effects? [] Yes (1) [x] No (0)  Do you have trouble affording your medicines? [] Yes (1) [x] No (0)  Are you ever unable to pick up your medication due to transportation difficulties? [] Yes (1) [x] No (0)  Do you ever stop taking your medications because you don't believe they are helping? [] Yes (1) [x] No (0)  Total score 0   Recommendations given to patient about increasing adherence: None needed. Will need to reassess if / when patient returns to community living.  Guideline-Directed Medical Therapy/Evidence Based Medicine  ACE/ARB/ARNI: None Beta Blocker: Metoprolol succinate 75 mg BID Aldosterone Antagonist: None Diuretic: Torsemide 20 mg daily    SUBJECTIVE  HPI: Patient is an 86 y/o F with PMH as below who presents to CHF clinic for new-patient visit. She was most recently hospitalized 4/23-4/28 for COVID-19 pneumonia and AKI.  Past Medical History:  Diagnosis Date  . 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 Hyperlipidemia   . Hypertension   . 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 Renal insufficiency       OBJECTIVE   Vital signs: HR 58, BP 118/58, weight (pounds) 210 ECHO: Date 12/31/19, EF 50-55%, notes: Mild LVH, LA + RA mildly dilated, abnormal MV with mild regurgitation, mild-moderate TVR  BMP Latest Ref Rng & Units 01/29/2020 01/28/2020 01/27/2020  Glucose 70 - 99 mg/dL 88 133(H) 137(H)  BUN 8 - 23 mg/dL 62(H) 59(H) 62(H)  Creatinine 0.44 - 1.00 mg/dL 1.76(H) 1.71(H) 2.07(H)  Sodium 135 - 145 mmol/L 139 140 140  Potassium 3.5 - 5.1 mmol/L 4.3 4.4 4.4  Chloride 98 - 111 mmol/L 112(H) 112(H) 112(H)  CO2 22 -  32 mmol/L 21(L) 20(L) 21(L)  Calcium 8.9 - 10.3 mg/dL 8.0(L) 8.3(L) 8.0(L)    ASSESSMENT  Patient endorses receiving medications at facility. Denies adverse effects of therapy. Reports breathing is stable, legs appear edematous.   PLAN  1). HFpEF -Fluid management with torsemide 20 mg daily + potassium 10 mEq daily -She is being weighed daily at facility. Weights have been overall stable but slowly trending upwards -Continue low sodium diet -Discussed with provider - no changes to medication regimen today  2). Atrial fibrillation -HR 58 today -Rate control with metoprolol succinate 75 mg BID -Anticoagulated with rivaroxaban 15 mg qSupper (renally adjusted) -Patient denies signs/symptoms of bleeding  3). Hyperlipidemia -Atorvastatin 20 mg daily -Lipid panel 01/01/20 with HDL 47, LDL 42, TG 52  4). CKD -Patient with AKI during recent hospitalization -Discharge Scr 1.76, GFR 29 on 01/29/20  5). Malignant lymphoma -Ibrutinib 420 mg  -Medication was held upon hospital discharge due to acute illness -It has not been resumed pending follow-up with oncology  6). Hyperuricemia -Allopurinol 200 mg daily  7). Peripheral neuropathy -Gabapentin 300 mg TID   Time spent: 15 minutes  [place] Resident 02/24/2020 9:23 AM    Current Outpatient Medications:  .  albuterol (VENTOLIN HFA) 108 (90 Base) MCG/ACT inhaler, Inhale 2 puffs into the lungs every 6 (six) hours as needed for wheezing or shortness of breath., Disp: 8 g, Rfl: 0 .  allopurinol (ZYLOPRIM) 100 MG tablet, Take 2 tablets (200 mg total) by mouth daily., Disp: 180 tablet, Rfl: 1 .  atorvastatin (LIPITOR) 20 MG tablet, Take 20 mg by mouth daily., Disp: , Rfl:  .  gabapentin (NEURONTIN) 300 MG capsule, Take 300 mg by mouth 3 (three) times daily. , Disp: , Rfl:  .  Ibrutinib (IMBRUVICA) 420 MG TABS, Hold until outpatient oncology followup, Disp: 28 tablet, Rfl: 2 .  metoprolol succinate (TOPROL-XL) 25 MG 24  hr tablet, Take 3 tablets (75 mg total) by mouth 2 (two) times daily., Disp: 60 tablet, Rfl: 0 .  potassium chloride (MICRO-K) 10 MEQ CR capsule, Take 10 mEq by mouth in the morning. , Disp: , Rfl:  .  Rivaroxaban (XARELTO) 15 MG TABS tablet, Take 1 tablet (15 mg total) by mouth daily with supper., Disp: 42 tablet, Rfl: 0 .  torsemide (DEMADEX) 20 MG tablet, Hold until outpatient PCP followup due to AKI and low blood pressure., Disp: 30 tablet, Rfl: 0 No current facility-administered medications for this visit.  Facility-Administered Medications Ordered in Other Visits:  .  cyanocobalamin ((VITAMIN B-12)) injection 1,000 mcg, 1,000 mcg, Intramuscular, Once, [place], Drue Second, MD   COUNSELING POINTS/CLINICAL PEARLS  Carvedilol (Goal: weight less than 85 kg is 25 mg BID, weight greater than 85 kg is 50 mg BID)  Patient should avoid activities requiring coordination until drug effects are realized, as drug may cause dizziness.  This drug may cause diarrhea, nausea, vomiting, arthralgia, back pain, myalgia, headache, vision disorder, erectile dysfunction, reduced libido, or fatigue.  Instruct patient to report signs/symptoms of adverse cardiovascular effects such as hypotension (especially in elderly patients), arrhythmias, syncope, palpitations, angina, or edema.  Drug may mask symptoms of hypoglycemia. Advise diabetic patients to carefully monitor blood sugar levels.  Patient should take drug with food.  Advise patient against sudden discontinuation of drug. Torsemide  Side effects may include excessive urination.  Tell patient to report symptoms of ototoxicity.  Instruct patient to report lightheadedness or syncope.  Warn patient to avoid use of nonprescription NSAID products without first discussing it with their healthcare provider.  DRUGS TO AVOID IN HEART FAILURE  Drug or Class Mechanism  Analgesics . NSAIDs . COX-2 inhibitors . Glucocorticoids  Sodium and water retention,  increased systemic vascular resistance, decreased response to diuretics   Diabetes Medications . Metformin . Thiazolidinediones o Rosiglitazone (Avandia) o Pioglitazone (Actos) . DPP4 Inhibitors o Saxagliptin (Onglyza) o Sitagliptin (Januvia)   Lactic acidosis Possible calcium channel blockade   Unknown  Antiarrhythmics . Class I  o Flecainide o Disopyramide . Class III o Sotalol . Other o Dronedarone  Negative inotrope, proarrhythmic   Proarrhythmic, beta blockade  Negative inotrope  Antihypertensives . Alpha Blockers o Doxazosin . Calcium Channel Blockers o Diltiazem o Verapamil o Nifedipine . Central Alpha Adrenergics o Moxonidine . Peripheral Vasodilators o Minoxidil  Increases renin and aldosterone  Negative inotrope    Possible sympathetic withdrawal  Unknown  Anti-infective . Itraconazole . Amphotericin B  Negative inotrope Unknown  Hematologic . Anagrelide . Cilostazol   Possible inhibition of PD IV Inhibition of PD III causing arrhythmias  Neurologic/Psychiatric . Stimulants . Anti-Seizure Drugs o Carbamazepine o Pregabalin . Antidepressants o Tricyclics o Citalopram . Parkinsons o Bromocriptine o Pergolide o Pramipexole . Antipsychotics o Clozapine . Antimigraine o Ergotamine o Methysergide . Appetite suppressants . Bipolar o Lithium  Peripheral alpha and beta agonist activity  Negative inotrope and chronotrope Calcium channel blockade  Negative inotrope, proarrhythmic Dose-dependent QT prolongation  Excessive serotonin activity/valvular damage Excessive serotonin activity/valvular damage Unknown  IgE mediated hypersensitivy, calcium channel blockade  Excessive serotonin  activity/valvular damage Excessive serotonin activity/valvular damage Valvular damage  Direct myofibrillar degeneration, adrenergic stimulation  Antimalarials . Chloroquine . Hydroxychloroquine Intracellular inhibition of lysosomal enzymes   Urologic Agents . Alpha Blockers o Doxazosin o Prazosin o Tamsulosin o Terazosin  Increased renin and aldosterone  Adapted from Page RL, et al. "Drugs That May Cause or Exacerbate Heart Failure: A Scientific Statement from the [place]." Circulation 2016; 881:J03-P59. DOI: 10.1161/CIR.0000000000000426   MEDICATION ADHERENCES TIPS AND STRATEGIES 1. Taking medication as prescribed improves patient outcomes in heart failure (reduces hospitalizations, improves symptoms, increases survival) 2. Side effects of medications can be managed by decreasing doses, switching agents, stopping drugs, or adding additional therapy. Please let someone in the [facility] know if you have having bothersome side effects so we can modify your regimen. Do not alter your medication regimen without talking to [place].  3. Medication reminders can help patients remember to take drugs on time. If you are missing or forgetting doses you can try linking behaviors, using pill boxes, or an electronic reminder like an alarm on your phone or an app. Some people can also get automated phone calls as medication reminders.

## 2020-02-24 NOTE — Patient Instructions (Signed)
Continue weighing daily and call for an overnight weight gain of > 2 pounds or a weekly weight gain of >5 pounds. 

## 2020-03-09 ENCOUNTER — Inpatient Hospital Stay: Payer: Medicare HMO | Attending: Hematology and Oncology

## 2020-03-09 ENCOUNTER — Other Ambulatory Visit: Payer: Self-pay

## 2020-03-09 DIAGNOSIS — C880 Waldenstrom macroglobulinemia: Secondary | ICD-10-CM | POA: Diagnosis present

## 2020-03-09 DIAGNOSIS — E538 Deficiency of other specified B group vitamins: Secondary | ICD-10-CM | POA: Insufficient documentation

## 2020-03-09 MED ORDER — CYANOCOBALAMIN 1000 MCG/ML IJ SOLN
1000.0000 ug | Freq: Once | INTRAMUSCULAR | Status: AC
  Administered 2020-03-09: 1000 ug via INTRAMUSCULAR
  Filled 2020-03-09: qty 1

## 2020-04-17 ENCOUNTER — Telehealth: Payer: Self-pay

## 2020-04-17 NOTE — Telephone Encounter (Signed)
Speaking with the patient and she states she has been taking Ibrutinib daily. The patient states she came back home on 03/30/2020 and she has plenty of medication. I have informed her if she begain to get low to contact the office.

## 2020-04-22 NOTE — Progress Notes (Signed)
[facility]  [address], [address] [place], [place] [address] Phone: [phone]  Fax: [phone]   Clinic Day:  04/23/2020  Referring physician: Jodi Marble, MD  Chief Complaint: Kristy Solomon is a 86 y.o. female with Waldenstrom's macroglobulinemia/lymphoplasmocytic lymphoma who is seen for a 3 month assessment.  HPI: The patient was last seen in the hematology clinic on 01/13/2020. At that time, she was fatigued.  She had struggled with lower extremity edema.  She denied any fevers or sweats.  Exam revealed no adenopathy or SQ nodularity.  She was admitted to the [facility] from 01/24/2020 - 01/29/2020 after presenting with abdominal pain for 1 hour with vomiting. SARS Coronavirus 2 testing was positive. CXR revealed patchy opacity in the right upper lung zone, may represent pneumonia. Asymmetric pulmonary edema was also considered. There was worsening nonspecific basilar opacities since radiographs earlier in the month. There was stable cardiomegaly.  She received 5 days of Remdesivir. Baseline creatinine was around 1.5-1.7.  Creatinine peaked at 2.46.  Cr was 1.76 on the day of discharge.  Abdomen and pelvis CT on 01/24/2020 revealed bilateral lung base ground-glass opacity, new since 07/2019 and suspicious for acute viral/atypical respiratory infection. Pulmonary edema was felt less likely. There was chronic cardiomegaly. There was no superimposed acute or inflammatory process identified in the noncontrast abdomen or pelvis. The abnormal left kidney with mass-like soft tissue at the left renal hilum was stable.  There was bulky superimposed bilateral nephrolithiasis, staghorn calculi on the right. There was diverticulosis of the descending colon but no active inflammation.   Chest CT on 02/18/2020 revealed subpleural reticulation/scarring and irregular septal thickening which was new from the prior. This appearance was nonspecific and favored postinflammatory  fibrosis related to COVID (in the appropriate clinical setting) or possibly drug toxicity. Pulmonary lymphoma was considered less likely but was difficult to exclude. There were small bilateral pleural effusions, new. There was no suspicious lymphadenopathy in the chest.  During the interim, she has been ok. She is now back at home. She notes that she is not able to completely take care of herself at home. She is able to eat, use the restroom, and get dressed by herself, but she does need assistance with preparing food and cleaning. Her family is helping her out some.   She has ben constipated, but she notes that she only took one dose of Miralax. She has not had a bowel movement in several days.    Past Medical History:  Diagnosis Date  . 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 CHF (congestive heart failure) ([place])   . Hyperlipidemia   . Hypertension   . Malignant lymphoma, lymphoplasmacytic ([place]) 12/27/2013  . Renal insufficiency     Past Surgical History:  Procedure Laterality Date  . ABDOMINAL HYSTERECTOMY    . HEMORRHOID SURGERY      Family History  Problem Relation Age of Onset  . Cancer Brother        throat ca  . Cancer Brother        bone cancer    Social History:  reports that she has never smoked. She has never used smokeless tobacco. She reports that she does not drink alcohol and does not use drugs. She lives in [place]. Her emergency contact is Cecille Po, her grandaughter- [phone]). [name]'s phone number [phone]). She lives in [place]. She has transportation issues. She states that her grandson's wife can assist with travel (needs to be arranged). The patient is alone  today.   Allergies:  Allergies  Allergen Reactions  . Eliquis [Apixaban]     abd pain    Current Medications: Current Outpatient Medications  Medication Sig Dispense Refill  . albuterol (VENTOLIN HFA) 108 (90 Base) MCG/ACT inhaler Inhale 2 puffs into the lungs every 6 (six)  hours as needed for wheezing or shortness of breath. 8 g 0  . allopurinol (ZYLOPRIM) 100 MG tablet Take 2 tablets (200 mg total) by mouth daily. 180 tablet 1  . atorvastatin (LIPITOR) 20 MG tablet Take 20 mg by mouth daily.    Marland Kitchen gabapentin (NEURONTIN) 300 MG capsule Take 300 mg by mouth 3 (three) times daily.     . Ibrutinib (IMBRUVICA) 420 MG TABS Hold until outpatient oncology followup (Patient not taking: Reported on 02/24/2020) 28 tablet 2  . metoprolol succinate (TOPROL-XL) 25 MG 24 hr tablet Take 3 tablets (75 mg total) by mouth 2 (two) times daily. 60 tablet 0  . potassium chloride (MICRO-K) 10 MEQ CR capsule Take 10 mEq by mouth in the morning.     . Rivaroxaban (XARELTO) 15 MG TABS tablet Take 1 tablet (15 mg total) by mouth daily with supper. 42 tablet 0  . torsemide (DEMADEX) 20 MG tablet Hold until outpatient PCP followup due to AKI and low blood pressure. (Patient taking differently: Take 20 mg by mouth daily. ) 30 tablet 0   No current facility-administered medications for this visit.   Facility-Administered Medications Ordered in Other Visits  Medication Dose Route Frequency Provider Last Rate Last Admin  . cyanocobalamin ((VITAMIN B-12)) injection 1,000 mcg  1,000 mcg Intramuscular Once Lequita Asal, MD        Review of Systems  Constitutional: Negative for chills, diaphoresis, fever, malaise/fatigue and weight loss (no new weight).  HENT: Negative.  Negative for congestion, ear discharge, ear pain, nosebleeds, sinus pain and tinnitus.   Eyes: Negative.  Negative for blurred vision, double vision, photophobia, pain, discharge and redness.  Respiratory: Positive for cough, sputum production and shortness of breath (improved since 01/29/2020). Negative for hemoptysis.        On 2 liters/min of supplemental oxygen.  Cardiovascular: Positive for leg swelling (chronic BLE). Negative for chest pain, palpitations, orthopnea, claudication and PND.       Atrial fibrillation.    Gastrointestinal: Positive for constipation. Negative for abdominal pain, blood in stool, diarrhea, heartburn, melena, nausea and vomiting.       Decreased appetite.  Genitourinary: Negative for dysuria, frequency, hematuria and urgency.       H/o nephrolithiasis.  LEFT renal mass. H/o hematuria.  Musculoskeletal: Negative.  Negative for back pain, falls, joint pain, myalgias and neck pain.       H/o gout.  Skin: Negative.  Negative for itching and rash.       No SQ nodules.  Neurological: Negative.  Negative for dizziness, tingling, tremors, sensory change, speech change, focal weakness, weakness and headaches.  Endo/Heme/Allergies: Negative.  Does not bruise/bleed easily.  Psychiatric/Behavioral: Negative for depression and memory loss. The patient has insomnia. The patient is not nervous/anxious.   All other systems reviewed and are negative.  Performance status (ECOG): 2   Vitals Blood pressure 106/81, pulse (!) 102, temperature 97.6 F (36.4 C), temperature source Tympanic, resp. rate 16, SpO2 98 %.   Physical Exam Vitals and nursing note reviewed.  Constitutional:      General: She is not in acute distress.    Appearance: She is well-developed. She is not diaphoretic.  Comments: Fatigued appearing woman sitting comfortably in wheelchair with portable oxygen.  HENT:     Head: Normocephalic and atraumatic.     Mouth/Throat:     Pharynx: No oropharyngeal exudate.  Eyes:     General: No scleral icterus.    Conjunctiva/sclera: Conjunctivae normal.     Pupils: Pupils are equal, round, and reactive to light.     Comments: Glasses.  Brown eyes.  Neck:     Vascular: No JVD.  Cardiovascular:     Rate and Rhythm: Normal rate. Rhythm irregular.     Heart sounds: No murmur heard.   Pulmonary:     Effort: Pulmonary effort is normal. No respiratory distress.     Breath sounds: Normal breath sounds. No wheezing or rales.  Chest:     Chest wall: No tenderness.  Abdominal:      General: Bowel sounds are normal. There is no distension.     Palpations: Abdomen is soft. There is no mass.     Tenderness: There is no abdominal tenderness. There is no guarding or rebound.  Musculoskeletal:        General: Tenderness (L>R) present. Normal range of motion.     Cervical back: Normal range of motion and neck supple.     Comments: 3+ bilateral chronic lower extremity edema.  Lymphadenopathy:     Head:     Right side of head: No preauricular, posterior auricular or occipital adenopathy.     Left side of head: No preauricular, posterior auricular or occipital adenopathy.     Cervical: No cervical adenopathy.     Upper Body:     Right upper body: No supraclavicular or axillary adenopathy.     Left upper body: No supraclavicular or axillary adenopathy.     Lower Body: No right inguinal adenopathy. No left inguinal adenopathy.  Skin:    General: Skin is warm and dry.     Coloration: Skin is not pale.     Findings: No erythema or rash.     Comments: No palpable SQ masses.  Neurological:     Mental Status: She is alert and oriented to person, place, and time.  Psychiatric:        Behavior: Behavior normal.        Thought Content: Thought content normal.        Judgment: Judgment normal.    Imaging studies: 03/17/2016:Chest, abdomen, and pelvisCTrevealed progression of diffuse lymphoma with subcutaneous deposits throughout the subcutaneous fat of the chest, abdomen,and pelvis. There were multiple nodular infiltrates throughout the lungs, likely due to lymphoma but could also represent infection or atypical infection. There was massive retroperitoneal lymphadenopathy with diffuse retroperitoneal, mesenteric, and pelvic lymphadenopathy. There was wall thickening and pelvic small bowel probably representing lymphomas involvement. There was probable direct invasion of the left kidney. 04/21/2016:Chest, abdomen, and pelvisCTrevealed a partial response to therapy.  There were bilateral pulmonary nodules/masses, measuring up to 5.6 cm in the right lower lobe (improved). There was trace left pleural effusion. There was a 12.7 cm left renal/perirenal mass (decreased) with associated mild left hydronephrosis. Retroperitoneal/left pelvic lymphadenopathy was mildly decreased. Multifocal subcutaneous nodules, measuring up to 4.5 cm in the left lower anterior abdominal wall were stable to mildly improved. 12/28/2017:Abdomen and pelvisCTwas concerning for small bowel obstruction with transitional zone at the distal ileum. There was no associated mass. LEFT renal massmeasured 6.1 x 5.0 cm (previously 13.7 x 10.7 cm). Suspect combination of mass and adenopathy due to lymphoma in the left adrenal.  This area was much less pronounced than on prior study. There was a 1.7 x 1.2 cm lymph node in the RIGHT external iliac node chain. Subcutaneous deposits bilaterally were considerably smaller compared to 2017. There were sizable calculi(2.0 x 1.7 cm, 2.6 x 2.0 cm) in each kidney without obstruction. 07/11/2018:Chest, abdomen, and pelvisCTrevealed a hypodense 3.6 cm inferior LEFT thyroid nodule (stable), new subpleural medial 0.8 cm apical RUL nodule, new subpleural anterior 0.5 cm LUL nodule, basilar LLL mass measuring 4.6 x 3.3 cm (previously 4.2 x 3.2 cm) and a basilar RLL nodule measuring 1.4 cm (previously 0.6 cm). The LEFT upper breast mass measuring 2.4 x 2.0 cm (previously 3.8 x 2.1 cm). There were superficial RIGHT chest wall masses measuring up to 2.3 x 0.9 cm (previously 4.1 x 1.6 cm). There were several bulky stones in the RIGHT renal pelvis measuring up to 22 x 11 mm, and a non-obstructing 13 mm LEFT renal stone. There was a coarsely calcified 2.3 cm anterior interpolar RIGHT renal cortical lesion, and a simple 1.8 cm anterior RIGHT renal cyst. The infiltrative soft tissue density, replacing the LEFT renal sinus and partially encasing the kidney/vascular  pedicle, spanned up to 10.2 x 6.5 cm. 07/12/2019: Chest abdomen and pelvis CTrevealed stable left breast lesion. The other subcutaneous lesions involving the right chest wall haddecreased in size since the prior examination. There were no new or progressive findings.There was improved CT appearance of the lungs.There was persistent patchy airspace opacities and scarring type changes but no discrete mass or nodule.Here was no mediastinal or hilar adenopathy.There was stable soft tissue density infiltrating the left kidney and left renal sinus and vascular pedicle region.There was stable bilateral renal calculi.There was no abdominal/pelvic lymphadenopathy or inguinal adenopathy.There was stable severe atherosclerotic disease. 01/24/2020:  Abdomen and pelvis CT revealed bilateral lung base ground-glass opacity, new since 07/2019 and suspicious for acute viral/atypical respiratory infection. Pulmonary edema was felt less likely. There was chronic cardiomegaly. There was no superimposed acute or inflammatory process identified in the noncontrast abdomen or pelvis. The abnormal left kidney with mass-like soft tissue at the left renal hilum was stable.  There was bulky superimposed bilateral nephrolithiasis, staghorn calculi on the right. There was diverticulosis of the descending colon but no active inflammation.  02/18/2020:  Chest CT revealed subpleural reticulation/scarring and irregular septal thickening which was new from the prior. This appearance was nonspecific and favored postinflammatory fibrosis related to COVID (in the appropriate clinical setting) or possibly drug toxicity. Pulmonary lymphoma was considered less likely but was difficult to exclude. There were small bilateral pleural effusions, new. There was no suspicious lymphadenopathy in the chest.   No visits with results within 3 Day(s) from this visit.  Latest known visit with results is:  No results displayed because visit has  over 200 results.      Assessment:  Kristy Solomon is a 86 y.o. 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 She presented with massive subcutaneous deposits, pulmonary nodules, involvement of left kidney, and bowel. Disease was unresponsive toRituxanin 2014.   Chest, abdomen, and pelvisCTon 03/17/2016 revealed progression of diffuse lymphoma with subcutaneous deposits throughout the subcutaneous fat of the chest, abdomen,and pelvis. There were multiple nodular infiltrates throughout the lungs, likely due to lymphoma but could also represent infection or atypical infection. There was massive retroperitoneal lymphadenopathy with diffuse retroperitoneal, mesenteric, and pelvic lymphadenopathy. There was wall thickening and pelvic small bowel probably representing lymphomas involvement. There was probable direct invasion of the left kidney.  Biopsyof the dominantSQ nodal conglomerationaround the right flank on 03/18/2016 revealed a persistent low-grade CD5 positive B-cell lymphoma. Flow cytometry was positive for CD20, CD79a, CD5, CD138. Neoplastic cells were positive for kappa and cyclin D1. CD 23 was negative. Additional molecular markers are pending.  Bone marrowaspirate and biopsy on 03/18/2016 revealed no evidence of lymphoma or plasma cell neoplasm (0.2% kappa monoclonal plasma cells by flow analysis). Marrow was hypercellular for age (50-60%) with trilineage hematopoiesis and no increased blasts. There was mild to moderate widespread increase in reticulin fibers. There was no storage iron detected.  The following studies were abnormal: Beta2-microglobulin was 9.6 (0.6-2.4). Serum viscosity was 5.5 (1.6-1.9). Uric acid was 11.1 on 03/19/2016 and 5.7 on 05/23/2016.   Normalstudies included: hepatitis B surface antigen, hepatitis B core antibody total, hepatitis C antibody, HIV testing, and LDH (123). G6PD assay was 9.2  (4.6-13.5).   She has iron deficiency(ferritin 27; iron saturation 11%),B12 deficiency (147), and folate deficiency(5.9). She has chronic renal insufficiency (BUN was 30-35, creatinine was 1.38 - 1.80 with a GFR 29-39 ml/min). She began B12 injectionson 03/28/2016 (last 03/09/2020). She is on folic acid. Folate was 11.2 on 01/13/2020.  She received 1 week of chlorambucilwith a tapering dose of prednisone(began 03/30/2016). Cortisol was 9.5 on 05/17/2016. She began ibrutinib on 04/26/2016. She has mild thrombocytopenia.   SPEPhas been followed: 5.0 gm/dL on 03/18/2016, 4.5 on 04/25/2016, 2.3 on 06/20/2016, 2.0 on 10/24/2016, 1.5 on 12/19/2016, 1.4 on 03/13/2017, 1.1 on 07/01/2017, 1.3 on 01/08/2018, 0.9 on 04/12/2018, 1.0 on 07/06/2018, 0.8 on 10/21/2019, and 0.6 on 01/13/2020.  IgM has been followed: >5850 on 03/18/2016, >5850 on 04/25/2016, 4652 on 06/09/2016, 3719 on 07/18/2016, 3429 on 10/24/2016, 2821 on 12/19/2016, 2295 on 03/13/2017, 2500 on 06/30/2017, 1890 09/29/2017, 1769 on 01/08/2018, 1244 on 04/12/2018, 1509 on 07/06/2018, 1567 on 09/11/2018, 1281 on 01/16/2019, 1094 on 05/01/2019, 1088 on 10/21/2019, and 950 on 01/13/2020.  Serum viscosityhas been followed: 5.5 on 03/18/2016 and 1.6 on 09/11/2018.  Abdomen and pelvis CT on 01/24/2020 revealed bilateral lung base ground-glass opacity, new since 07/2019 and suspicious for acute viral/atypical respiratory infection. Pulmonary edema was felt less likely. There was chronic cardiomegaly. There was no superimposed acute or inflammatory process identified in the noncontrast abdomen or pelvis. The abnormal left kidney with mass-like soft tissue at the left renal hilum was stable.  There was bulky superimposed bilateral nephrolithiasis, staghorn calculi on the right. There was diverticulosis of the descending colon but no active inflammation.  Chest CT on 02/18/2020 revealed subpleural reticulation/scarring and irregular  septal thickening which was new from the prior. This appearance was nonspecific and favored postinflammatory fibrosis related to COVID (in the appropriate clinical setting) or possibly drug toxicity. Pulmonary lymphoma was considered less likely but was difficult to exclude. There were small bilateral pleural effusions, new. There was no suspicious lymphadenopathy in the chest.  She has atrial fibrillation.  CHA2DS2-VASc score is 9 (age, HTN, gender).She is on Xarelto 15 mg a day.  She was admitted to the [facility] from 01/24/2020 - 01/29/2020 with pneumonia due to COVID-19 and acute on chronic renal insufficiency.  She received 5 days of Remdesivir. Baseline creatinine was around 1.5-1.7.  Creatinine peaked at 2.46.  Cr was 1.76 on the day of discharge.  Symptomatically, she requires some assistance with activities of daily living.  She denies any B symptoms.  Exam reveals no adenopathy, hepatosplenomegaly or subcutaneous masses.  Plan: 1.   Labs today: CBC with diff, CMP, SPEP, IgM, LDH, uric acid, ferritin,  iron studies. 2. Waldenstrm's macroglobulinemia Clinically, she is doing fairly well on ibrutinib. Exam  reveals no adenopathy, hepatosplenomegaly or subcutaneous nodularity. Chest CT on 02/18/2020 was personally reviewed.  Agree with radiology findings.   No clear evidence of disease.   Suspect pleural reticulation/scarring and septal thickening are due to sequela from COVID-19.  Abdomen and pelvis CT on 01/24/2020 was personally reviewed.  Agree with radiology findings.   The left kidney mass is stable.  LDH is 149 (normal). IgM is 563 (improved (and M-spike is 0.5 gm/dL (improved).  Discuss plans for follow-up chest CT on 05/20/2020.  Continue ibrutinib. 3.Thrombocytopenia Hematocrit 40.3, hemoglobin 12.6, platelets   91,000,WBC4800 ([place] 2600) on 05/01/2019. Hematocrit 42.8, hemoglobin 12.9, platelets   87,000,WBC4700 ([place] 2500)on  07/22/2019.       Hematocrit 44.0, hemoglobin 13.9, platelets   89,000,WBC3700 ([facility] 2400)on 10/21/2019.  Hematocrit 42.8, hemoglobin 13.4, platelets   86,000,WBC 4200 ([place] 2900)on 01/13/2020.  Hematocrit 37.8, hemoglobin 11.3, platelets 109,000, WBC 4900 (ANC 2400) on 04/23/2020.  Platelet count has improved.  Continue to monitor 4. LEFT renal mass Imaging studies in 07/12/2019 revealed a stable soft tissue density infiltrating the left kidney and left renal sinus as well as vascular pedicle region. Mass encasing the left kidneyis c/wWaldenstrm's. Follow-up abdomen and pelvis CT on 01/24/2020 as noted above.  The left kidney mass is stable. Continue to monitor. 5.Renal insufficiency Creatinine1. 67 today.  Creatinine 1.31-1.74 last 7 months. She has known left renal involvement with Waldenstrom's as well as nephrolithiasis. Patient is followed by Dr. Juleen China. Continue to avoid IV contrast secondary to renal function. 6.Atrial fibrillation  Patient on Xarelto per cardiology.   Continue to monitor closely 7. B12 deficiency LastB12 on06/04/2020. Continue B12 monthly. 8. Folate deficiency Folate 11.2 on 01/13/2020. Monitor annually. 9.     Schedule f/u appt with Dr Juleen China. 10.   Chest CT 05/20/2020. 11.   RTC in 3 months for MD assessment, labs (CBC with diff, CMP, uric acid, LDH, SPEP).  I discussed the assessment and treatment plan with the patient.  The patient was provided an opportunity to ask questions and all were answered.  The patient agreed with the plan and demonstrated an understanding of the instructions.  The patient was advised to call back if the symptoms worsen or if the condition fails to improve as anticipated.   Lequita Asal, MD, PhD    04/23/2020, 9:45 AM  I, Jacqualyn Posey, am acting as a Education administrator for [employer].  Mike Gip, MD.   I, James Senn C. Mike Gip, MD, have reviewed the above documentation for accuracy and completeness, and I agree with the above.

## 2020-04-23 ENCOUNTER — Other Ambulatory Visit: Payer: Self-pay

## 2020-04-23 ENCOUNTER — Other Ambulatory Visit: Payer: Self-pay | Admitting: Hematology and Oncology

## 2020-04-23 ENCOUNTER — Encounter: Payer: Self-pay | Admitting: Hematology and Oncology

## 2020-04-23 ENCOUNTER — Inpatient Hospital Stay: Payer: Medicare HMO | Attending: Hematology and Oncology

## 2020-04-23 ENCOUNTER — Inpatient Hospital Stay (HOSPITAL_BASED_OUTPATIENT_CLINIC_OR_DEPARTMENT_OTHER): Payer: Medicare HMO | Admitting: Hematology and Oncology

## 2020-04-23 VITALS — BP 106/81 | HR 102 | Temp 97.6°F | Resp 16

## 2020-04-23 DIAGNOSIS — D696 Thrombocytopenia, unspecified: Secondary | ICD-10-CM | POA: Insufficient documentation

## 2020-04-23 DIAGNOSIS — E538 Deficiency of other specified B group vitamins: Secondary | ICD-10-CM

## 2020-04-23 DIAGNOSIS — E790 Hyperuricemia without signs of inflammatory arthritis and tophaceous disease: Secondary | ICD-10-CM

## 2020-04-23 DIAGNOSIS — Z7189 Other specified counseling: Secondary | ICD-10-CM

## 2020-04-23 DIAGNOSIS — C880 Waldenstrom macroglobulinemia: Secondary | ICD-10-CM

## 2020-04-23 DIAGNOSIS — I4891 Unspecified atrial fibrillation: Secondary | ICD-10-CM | POA: Diagnosis not present

## 2020-04-23 DIAGNOSIS — I110 Hypertensive heart disease with heart failure: Secondary | ICD-10-CM | POA: Insufficient documentation

## 2020-04-23 DIAGNOSIS — R9389 Abnormal findings on diagnostic imaging of other specified body structures: Secondary | ICD-10-CM

## 2020-04-23 DIAGNOSIS — I509 Heart failure, unspecified: Secondary | ICD-10-CM | POA: Diagnosis not present

## 2020-04-23 DIAGNOSIS — K5900 Constipation, unspecified: Secondary | ICD-10-CM | POA: Insufficient documentation

## 2020-04-23 DIAGNOSIS — D500 Iron deficiency anemia secondary to blood loss (chronic): Secondary | ICD-10-CM

## 2020-04-23 DIAGNOSIS — N2889 Other specified disorders of kidney and ureter: Secondary | ICD-10-CM | POA: Insufficient documentation

## 2020-04-23 DIAGNOSIS — Z79899 Other long term (current) drug therapy: Secondary | ICD-10-CM | POA: Diagnosis not present

## 2020-04-23 DIAGNOSIS — Z7901 Long term (current) use of anticoagulants: Secondary | ICD-10-CM | POA: Diagnosis not present

## 2020-04-23 DIAGNOSIS — N189 Chronic kidney disease, unspecified: Secondary | ICD-10-CM | POA: Insufficient documentation

## 2020-04-23 LAB — COMPREHENSIVE METABOLIC PANEL
ALT: 16 U/L (ref 0–44)
AST: 15 U/L (ref 15–41)
Albumin: 3.1 g/dL — ABNORMAL LOW (ref 3.5–5.0)
Alkaline Phosphatase: 55 U/L (ref 38–126)
Anion gap: 8 (ref 5–15)
BUN: 19 mg/dL (ref 8–23)
CO2: 25 mmol/L (ref 22–32)
Calcium: 8.7 mg/dL — ABNORMAL LOW (ref 8.9–10.3)
Chloride: 105 mmol/L (ref 98–111)
Creatinine, Ser: 1.67 mg/dL — ABNORMAL HIGH (ref 0.44–1.00)
GFR calc Af Amer: 31 mL/min — ABNORMAL LOW (ref >60)
GFR calc non Af Amer: 27 mL/min — ABNORMAL LOW (ref >60)
Glucose, Bld: 99 mg/dL (ref 70–99)
Potassium: 3.7 mmol/L (ref 3.5–5.1)
Sodium: 138 mmol/L (ref 135–145)
Total Bilirubin: 0.9 mg/dL (ref 0.3–1.2)
Total Protein: 6.2 g/dL — ABNORMAL LOW (ref 6.5–8.1)

## 2020-04-23 LAB — CBC WITH DIFFERENTIAL/PLATELET
Abs Immature Granulocytes: 0.01 10*3/uL (ref 0.00–0.07)
Basophils Absolute: 0 10*3/uL (ref 0.0–0.1)
Basophils Relative: 1 %
Eosinophils Absolute: 0 10*3/uL (ref 0.0–0.5)
Eosinophils Relative: 0 %
HCT: 37.8 % (ref 36.0–46.0)
Hemoglobin: 11.3 g/dL — ABNORMAL LOW (ref 12.0–15.0)
Immature Granulocytes: 0 %
Lymphocytes Relative: 38 %
Lymphs Abs: 1.8 10*3/uL (ref 0.7–4.0)
MCH: 24.2 pg — ABNORMAL LOW (ref 26.0–34.0)
MCHC: 29.9 g/dL — ABNORMAL LOW (ref 30.0–36.0)
MCV: 81.1 fL (ref 80.0–100.0)
Monocytes Absolute: 0.5 10*3/uL (ref 0.1–1.0)
Monocytes Relative: 11 %
Neutro Abs: 2.4 10*3/uL (ref 1.7–7.7)
Neutrophils Relative %: 50 %
Platelets: 109 10*3/uL — ABNORMAL LOW (ref 150–400)
RBC: 4.66 MIL/uL (ref 3.87–5.11)
RDW: 17.2 % — ABNORMAL HIGH (ref 11.5–15.5)
WBC: 4.9 10*3/uL (ref 4.0–10.5)
nRBC: 0 % (ref 0.0–0.2)

## 2020-04-23 LAB — IRON AND TIBC
Iron: 23 ug/dL — ABNORMAL LOW (ref 28–170)
Saturation Ratios: 7 % — ABNORMAL LOW (ref 10.4–31.8)
TIBC: 354 ug/dL (ref 250–450)
UIBC: 331 ug/dL

## 2020-04-23 LAB — LACTATE DEHYDROGENASE: LDH: 149 U/L (ref 98–192)

## 2020-04-23 LAB — URIC ACID: Uric Acid, Serum: 8.3 mg/dL — ABNORMAL HIGH (ref 2.5–7.1)

## 2020-04-23 LAB — FERRITIN: Ferritin: 22 ng/mL (ref 11–307)

## 2020-04-23 MED ORDER — ALLOPURINOL 100 MG PO TABS: 50.0000 mg | ORAL_TABLET | Freq: Every day | ORAL | 1 refills | Status: AC

## 2020-04-23 NOTE — Progress Notes (Signed)
Patient reports constipation x 3-4 days. Only used miralax x 1 dose. She states that she needs a RF on allopurinol. She denies any other complaints. Patient in w/c. She denies any falls in the last 3 months.  Lab unable to collect her blood today after multiple attempts by phlebotomist and infusion RN. Dr. Mike Gip made aware.

## 2020-04-23 NOTE — Progress Notes (Signed)
Referral faxed to Dr. Juleen China.

## 2020-04-24 ENCOUNTER — Telehealth: Payer: Self-pay | Admitting: *Deleted

## 2020-04-24 ENCOUNTER — Telehealth: Payer: Self-pay

## 2020-04-24 ENCOUNTER — Other Ambulatory Visit: Payer: Self-pay

## 2020-04-24 DIAGNOSIS — C880 Waldenstrom macroglobulinemia: Secondary | ICD-10-CM

## 2020-04-24 LAB — IGM: IgM (Immunoglobulin M), Srm: 563 mg/dL — ABNORMAL HIGH (ref 26–217)

## 2020-04-24 NOTE — Progress Notes (Signed)
Patient ID: Kristy Solomon, female    DOB: [birth year], 86 y.o.   MRN: [id number]  HPI  Kristy Solomon is a 86 y/o female with a history of malignant lymphoma, hyperlipidemia, HTN, CKD, anemia and chronic heart failure.   Echo report from 12/31/19 reviewed and showed an EF of 50-55% along with mild LVH, mild MR and mild/moderate TR.   Admitted 01/24/20 due to pneumonia due to [place]. Cardiology and palliative care consults obtained. Received 5 days of remdesivir. Abdominal CT was negative. Elevated troponin thought to be due to demand ischemia. Discharged after 5 days. Admitted 12/31/19 due to acute on chronic heart failure. Cardiology consult obtained. Antibiotics given for atelectasis. Initially given IV lasix and then transitioned to oral diuretics. Elevated troponin thought to be due to demand ischemia. Discharged after 9 days. Was in the ED 12/16/19 due to hematuria where she was evaluated and released.   She presents today for a follow-up visit with a chief complaint of moderate shortness of breath upon minimal exertion. She describes this as chronic in nature having been present for several years. She has associated fatigue, worsening pedal edema and chronic difficulty sleeping along with this. She denies any dizziness, abdominal distention, palpitations, chest pain, cough or weight gain.   Says that she just saw cardiology who has adjusted her diuretic/ potassium pills to help reduce the swelling. Tries to elevate her legs "when I can".   Past Medical History:  Diagnosis Date  . 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 CHF (congestive heart failure) ([place])   . Hyperlipidemia   . Hypertension   . 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 Renal insufficiency    Past Surgical History:  Procedure Laterality Date  . ABDOMINAL HYSTERECTOMY    . HEMORRHOID SURGERY     Family History  Problem Relation Age of Onset  . Cancer Brother        throat ca  . Cancer Brother         bone cancer   Social History   Tobacco Use  . Smoking status: Never Smoker  . Smokeless tobacco: Never Used  Substance Use Topics  . Alcohol use: No   Allergies  Allergen Reactions  . Eliquis [Apixaban]     abd pain   Prior to Admission medications   Medication Sig Start Date End Date Taking? Authorizing Provider  albuterol (VENTOLIN HFA) 108 (90 Base) MCG/ACT inhaler Inhale 2 puffs into the lungs every 6 (six) hours as needed for wheezing or shortness of breath. 01/09/20  Yes Fritzi Mandes, MD  allopurinol (ZYLOPRIM) 100 MG tablet Take 0.5 tablets (50 mg total) by mouth daily. 04/23/20  Yes Corcoran, Drue Second, MD  atorvastatin (LIPITOR) 20 MG tablet Take 20 mg by mouth daily. 12/24/13  Yes [provider]  gabapentin (NEURONTIN) 300 MG capsule Take 300 mg by mouth 3 (three) times daily.    Yes [provider]  Ibrutinib (IMBRUVICA) 420 MG TABS Hold until outpatient oncology followup 01/29/20  Yes Enzo Bi, MD  metoprolol succinate (TOPROL-XL) 25 MG 24 hr tablet Take 3 tablets (75 mg total) by mouth 2 (two) times daily. 01/09/20  Yes Fritzi Mandes, MD  potassium chloride (MICRO-K) 10 MEQ CR capsule Take 2 capsules (20 mEq total) by mouth 2 (two) times daily. 04/27/20  Yes Kate Sable, MD  Rivaroxaban (XARELTO) 15 MG TABS tablet Take 1 tablet (15 mg total) by mouth daily with supper. 01/09/20  Yes Fritzi Mandes, MD  torsemide [facility]) 20  MG tablet Take 2 tablets (40 mg total) by mouth 2 (two) times daily. 04/27/20  Yes Kate Sable, MD    Review of Systems  Constitutional: Positive for fatigue. Negative for appetite change.  HENT: Negative for congestion, postnasal drip and sore throat.   Eyes: Negative.   Respiratory: Positive for shortness of breath (with little exertion). Negative for cough and wheezing.   Cardiovascular: Positive for leg swelling. Negative for chest pain and palpitations.  Gastrointestinal: Negative for abdominal distention and abdominal pain.   Endocrine: Negative.   Genitourinary: Negative.   Musculoskeletal: Positive for arthralgias (leg pain at times). Negative for neck pain.  Skin: Negative.   Allergic/Immunologic: Negative.   Neurological: Negative for dizziness and light-headedness.  Hematological: Negative for adenopathy. Does not bruise/bleed easily.  Psychiatric/Behavioral: Positive for sleep disturbance (sleeping on 2-3 pillows). Negative for dysphoric mood. The patient is not nervous/anxious.    Vitals:   04/27/20 1303  BP: (!) 163/96  Pulse: 99  Resp: 16  SpO2: 98%  Weight: (!) 207 lb (93.9 kg)  Height: 5\' 5"  (1.651 m)   Wt Readings from Last 3 Encounters:  04/27/20 (!) 207 lb (93.9 kg)  04/27/20 (!) 207 lb 2 oz (94 kg)  02/24/20 210 lb (95.3 kg)   Lab Results  Component Value Date   CREATININE 1.67 (H) 04/23/2020   CREATININE 1.76 (H) 01/29/2020   CREATININE 1.71 (H) 01/28/2020    Physical Exam Vitals and nursing note reviewed.  Constitutional:      Appearance: Normal appearance.  HENT:     Head: Normocephalic and atraumatic.  Cardiovascular:     Rate and Rhythm: Normal rate. Rhythm irregular.  Pulmonary:     Effort: Pulmonary effort is normal. No respiratory distress.     Breath sounds: No wheezing or rales.  Abdominal:     General: There is no distension.     Palpations: Abdomen is soft.  Musculoskeletal:        General: No tenderness.     Cervical back: Normal range of motion and neck supple.     Right lower leg: Edema (3+ pitting) present.     Left lower leg: Edema (3+ pitting) present.  Skin:    General: Skin is warm and dry.  Neurological:     General: No focal deficit present.     Mental Status: She is alert and oriented to person, place, and time.  Psychiatric:        Mood and Affect: Mood normal.        Behavior: Behavior normal.    Assessment & Plan:  1: Chronic heart failure with preserved ejection fraction with structural changes- - NYHA class III - euvolemic today -  not weighing daily at home; encouraged her to resume and to call for an overnight weight gain of >2 pounds or a weekly weight gain of >5 pounds - weight down 3 pounds from last visit here 2 months ago - does add "some salt" and she was encouraged to not add any salt to her food - saw cardiology (Agbor-Etang) earlier today with increase of diuretic/ potassium  - BNP 01/24/20 was 262.0  2: HTN- - BP mildly elevated but will begin taking increased dose of diuretic - saw PCP (Tejan-Sie) but unable to recall the date - BMP 04/23/20 reviewed and showed sodium 138, potassium 3.7, creatinine 1.67 and GFR 31  3: Lymphedema- - stage 2 - patient says that she elevates her legs when she can - unable to wear compression  socks because they are too tight - consider compression boots in the future   Medication list reviewed.  Patient would like to follow with just cardiology and not have to come to both clinics. Advised patient that she could call back at anytime to schedule another appointment and patient was comfortable with that plan.

## 2020-04-24 NOTE — Telephone Encounter (Signed)
Kristy Solomon - please call patient with md recommendations.

## 2020-04-24 NOTE — Telephone Encounter (Signed)
-----   Message from Lequita Asal, MD sent at 04/23/2020  4:11 PM EDT ----- Regarding: Patient is iron deficient  Can she start oral iron (ferrous sulfate) 1 tablet a day with OJ or vitamin C?  M ----- Message ----- From: Buel Ream, Lab In [place] Sent: 04/23/2020  10:31 AM EDT To: Lequita Asal, MD

## 2020-04-24 NOTE — Telephone Encounter (Signed)
Spoke with the patient to inform her, Per Dr Mike Gip her ferritin is currently low and Per Dr Mike Gip she would like for the patient to start Oral Iron 325 mg 1 tab po daily with OJ or Vitamin C. The patient was understanding and agreeable.

## 2020-04-27 ENCOUNTER — Encounter: Payer: Self-pay | Admitting: Cardiology

## 2020-04-27 ENCOUNTER — Other Ambulatory Visit: Payer: Self-pay

## 2020-04-27 ENCOUNTER — Ambulatory Visit: Payer: Medicare HMO | Attending: Family | Admitting: Family

## 2020-04-27 ENCOUNTER — Encounter: Payer: Self-pay | Admitting: Family

## 2020-04-27 ENCOUNTER — Ambulatory Visit (INDEPENDENT_AMBULATORY_CARE_PROVIDER_SITE_OTHER): Payer: Medicare HMO | Admitting: Cardiology

## 2020-04-27 ENCOUNTER — Ambulatory Visit: Payer: Medicare HMO | Admitting: Cardiology

## 2020-04-27 VITALS — BP 164/90 | HR 107 | Ht 65.0 in | Wt 207.1 lb

## 2020-04-27 VITALS — BP 163/96 | HR 99 | Resp 16 | Ht 65.0 in | Wt 207.0 lb

## 2020-04-27 DIAGNOSIS — E785 Hyperlipidemia, unspecified: Secondary | ICD-10-CM | POA: Insufficient documentation

## 2020-04-27 DIAGNOSIS — I890 Lymphedema, not elsewhere classified: Secondary | ICD-10-CM | POA: Diagnosis not present

## 2020-04-27 DIAGNOSIS — Z8719 Personal history of other diseases of the digestive system: Secondary | ICD-10-CM | POA: Insufficient documentation

## 2020-04-27 DIAGNOSIS — I10 Essential (primary) hypertension: Secondary | ICD-10-CM | POA: Diagnosis not present

## 2020-04-27 DIAGNOSIS — Z888 Allergy status to other drugs, medicaments and biological substances status: Secondary | ICD-10-CM | POA: Insufficient documentation

## 2020-04-27 DIAGNOSIS — Z8616 Personal history of COVID-19: Secondary | ICD-10-CM | POA: Insufficient documentation

## 2020-04-27 DIAGNOSIS — I130 Hypertensive heart and chronic kidney disease with heart failure and stage 1 through stage 4 chronic kidney disease, or unspecified chronic kidney disease: Secondary | ICD-10-CM | POA: Diagnosis not present

## 2020-04-27 DIAGNOSIS — I5032 Chronic diastolic (congestive) heart failure: Secondary | ICD-10-CM | POA: Diagnosis present

## 2020-04-27 DIAGNOSIS — I4891 Unspecified atrial fibrillation: Secondary | ICD-10-CM

## 2020-04-27 DIAGNOSIS — N189 Chronic kidney disease, unspecified: Secondary | ICD-10-CM | POA: Insufficient documentation

## 2020-04-27 DIAGNOSIS — Z8572 Personal history of non-Hodgkin lymphomas: Secondary | ICD-10-CM | POA: Diagnosis not present

## 2020-04-27 DIAGNOSIS — Z8701 Personal history of pneumonia (recurrent): Secondary | ICD-10-CM | POA: Diagnosis not present

## 2020-04-27 DIAGNOSIS — Z79899 Other long term (current) drug therapy: Secondary | ICD-10-CM | POA: Diagnosis not present

## 2020-04-27 DIAGNOSIS — Z7901 Long term (current) use of anticoagulants: Secondary | ICD-10-CM | POA: Insufficient documentation

## 2020-04-27 LAB — PROTEIN ELECTROPHORESIS, SERUM
A/G Ratio: 1.1 (ref 0.7–1.7)
Albumin ELP: 3.1 g/dL (ref 2.9–4.4)
Alpha-1-Globulin: 0.2 g/dL (ref 0.0–0.4)
Alpha-2-Globulin: 0.5 g/dL (ref 0.4–1.0)
Beta Globulin: 0.9 g/dL (ref 0.7–1.3)
Gamma Globulin: 1.2 g/dL (ref 0.4–1.8)
Globulin, Total: 2.8 g/dL (ref 2.2–3.9)
M-Spike, %: 0.5 g/dL — ABNORMAL HIGH
Total Protein ELP: 5.9 g/dL — ABNORMAL LOW (ref 6.0–8.5)

## 2020-04-27 MED ORDER — POTASSIUM CHLORIDE ER 10 MEQ PO CPCR: 20.0000 meq | ORAL_CAPSULE | Freq: Two times a day (BID) | ORAL | 1 refills | Status: DC

## 2020-04-27 MED ORDER — TORSEMIDE 20 MG PO TABS: 40.0000 mg | ORAL_TABLET | Freq: Two times a day (BID) | ORAL | 1 refills | Status: DC

## 2020-04-27 MED FILL — IMBRUVICA 420 MG TAB: 420 | 28 days supply | Qty: 28 | Fill #2

## 2020-04-27 NOTE — Patient Instructions (Signed)
Medication Instructions:  Your physician has recommended you make the following change in your medication:   INCREASE Torsemide to 40 mg (2 tablets) twice daily. An Rx has been sent to your pharmacy.  INCREASE Potassium to 20 mEq (2 tablets) twice daily. An Rx has been sent to your pharmacy.  *If you need a refill on your cardiac medications before your next appointment, please call your pharmacy*   Lab Work: Your physician recommends that you return for lab work in: 1 week (bmet) same day as your next appointment  If you have labs (blood work) drawn today and your tests are completely normal, you will receive your results only by: Marland Kitchen MyChart Message (if you have MyChart) OR . A paper copy in the mail If you have any lab test that is abnormal or we need to change your treatment, we will call you to review the results.   Testing/Procedures: None ordered   Follow-Up: At [facility], you and your health needs are our priority.  As part of our continuing mission to provide you with exceptional heart care, we have created designated Provider Care Teams.  These Care Teams include your primary Cardiologist (physician) and Advanced Practice Providers (APPs -  Physician Assistants and Nurse Practitioners) who all work together to provide you with the care you need, when you need it.  We recommend signing up for the patient portal called "MyChart".  Sign up information is provided on this After Visit Summary.  MyChart is used to connect with patients for Virtual Visits (Telemedicine).  Patients are able to view lab/test results, encounter notes, upcoming appointments, etc.  Non-urgent messages can be sent to your provider as well.   To learn more about what you can do with MyChart, go to [email or web address].    Your next appointment:   1 week  The format for your next appointment:   In Person  Provider:   Kate Sable, MD   Other Instructions N/A

## 2020-04-27 NOTE — Patient Instructions (Addendum)
Resume weighing daily and call for an overnight weight gain of > 2 pounds or a weekly weight gain of >5 pounds.   Call us in the future if you'd like to schedule another appointment 

## 2020-04-27 NOTE — Progress Notes (Signed)
Cardiology Office Note:    Date:  04/27/2020   ID:  Kristy Solomon, DOB [birth year], MRN [id number]  PCP:  Jodi Marble, MD  Cardiologist:  Kate Sable, MD  Electrophysiologist:  None   Referring MD: Jodi Marble, MD   Chief Complaint  Patient presents with  . OTHER    2 month f/u c/o sob and edema feet and legs. Meds reviewed verbally with pt.    History of Present Illness:    Kristy Solomon is a 86 y.o. female with a hx of hypertension, heart failure preserved ejection fraction, paroxysmal atrial fibrillation on Eliquis, hyperlipidemia, lymphoma, renal insufficiency who presents for follow-up.    She was last seen due to lower extremity edema, torsemide 20 mg daily was started.  Her symptoms of lower extremity edema have worsened.  She takes all her medications as prescribed.  Previous notes Cardiac monitor showed frequent ventricular ectopies, 20% burden.  Echo shows normal ejection fraction, grade 2 diastolic dysfunction.  Lower extremity edema noted on last visit.  Lasix 40 mg daily was started but patient states her edema has worsened.. prior EKG in the office showed atrial fibrillation.  Patient started on Toprol-XL and Eliquis.  Past Medical History:  Diagnosis Date  . 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 CHF (congestive heart failure) ([place])   . Hyperlipidemia   . Hypertension   . 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 Renal insufficiency     Past Surgical History:  Procedure Laterality Date  . ABDOMINAL HYSTERECTOMY    . HEMORRHOID SURGERY      Current Medications: Current Meds  Medication Sig  . albuterol (VENTOLIN HFA) 108 (90 Base) MCG/ACT inhaler Inhale 2 puffs into the lungs every 6 (six) hours as needed for wheezing or shortness of breath.  . allopurinol (ZYLOPRIM) 100 MG tablet Take 0.5 tablets (50 mg total) by mouth daily.  Marland Kitchen atorvastatin (LIPITOR) 20 MG tablet Take 20 mg by mouth daily.  Marland Kitchen gabapentin  (NEURONTIN) 300 MG capsule Take 300 mg by mouth 3 (three) times daily.   . metoprolol succinate (TOPROL-XL) 25 MG 24 hr tablet Take 3 tablets (75 mg total) by mouth 2 (two) times daily.  . potassium chloride (MICRO-K) 10 MEQ CR capsule Take 2 capsules (20 mEq total) by mouth 2 (two) times daily.  . Rivaroxaban (XARELTO) 15 MG TABS tablet Take 1 tablet (15 mg total) by mouth daily with supper.  . [DISCONTINUED] potassium chloride (MICRO-K) 10 MEQ CR capsule Take 10 mEq by mouth in the morning.      Allergies:   Eliquis [apixaban]   Social History   Socioeconomic History  . Marital status: Widowed    Spouse name: Not on file  . Number of children: Not on file  . Years of education: Not on file  . Highest education level: Not on file  Occupational History  . Not on file  Tobacco Use  . Smoking status: Never Smoker  . Smokeless tobacco: Never Used  Vaping Use  . Vaping Use: Never used  Substance and Sexual Activity  . Alcohol use: No  . Drug use: No  . Sexual activity: Not on file  Other Topics Concern  . Not on file  Social History Narrative   Lives alone. Daughter assists as needed.   Social Determinants of Health   Financial Resource Strain:   . Difficulty of Paying Living Expenses:   Food Insecurity:   . Worried About Charity fundraiser in  the Last Year:   . [place] in the Last Year:   Transportation Needs:   . Film/video editor (Medical):   Marland Kitchen Lack of Transportation (Non-Medical):   Physical Activity:   . Days of Exercise per Week:   . Minutes of Exercise per Session:   Stress:   . Feeling of Stress :   Social Connections:   . Frequency of Communication with Friends and Family:   . Frequency of Social Gatherings with Friends and Family:   . Attends Religious Services:   . Active Member of Clubs or Organizations:   . Attends Archivist Meetings:   Marland Kitchen Marital Status:      Family History: The patient's family history includes Cancer in her  brother and brother.  ROS:   Please see the history of present illness.     All other systems reviewed and are negative.  EKGs/Labs/Other Studies Reviewed:    The following studies were reviewed today:   EKG:  EKG is  ordered today.  The ekg ordered today demonstrates atrial fibrillation heart rate 114.  Occasional PVCs.  Recent Labs: 01/04/2020: TSH 3.817 01/24/2020: B Natriuretic Peptide 262.0 01/29/2020: Magnesium 2.5 04/23/2020: ALT 16; BUN 19; Creatinine, Ser 1.67; Hemoglobin 11.3; Platelets 109; Potassium 3.7; Sodium 138  Recent Lipid Panel    Component Value Date/Time   CHOL 99 01/01/2020 0526   TRIG 52 01/01/2020 0526   HDL 47 01/01/2020 0526   CHOLHDL 2.1 01/01/2020 0526   VLDL 10 01/01/2020 0526   LDLCALC 42 01/01/2020 0526    Physical Exam:    VS:  BP (!) 164/90 (BP Location: Right Arm, Patient Position: Sitting, Cuff Size: Normal)   Pulse (!) 107   Ht 5\' 5"  (1.651 m)   Wt (!) 207 lb 2 oz (94 kg)   SpO2 96%   BMI 34.47 kg/m     Wt Readings from Last 3 Encounters:  04/27/20 (!) 207 lb 2 oz (94 kg)  02/24/20 210 lb (95.3 kg)  01/24/20 220 lb (99.8 kg)     GEN:  Well nourished, well developed in no acute distress HEENT: Normal NECK: No JVD; No carotid bruits LYMPHATICS: No lymphadenopathy CARDIAC: Regular bradycardic, occasional skipped beats., no murmurs, rubs, gallops RESPIRATORY:  Clear to auscultation without rales, wheezing or rhonchi  ABDOMEN: Soft, non-tender, distended MUSCULOSKELETAL:  3+ edema; No deformity  SKIN: Warm and dry NEUROLOGIC:  Alert and oriented x 3 PSYCHIATRIC:  Normal affect   ASSESSMENT:    1. Atrial fibrillation, unspecified type ([place])   2. Essential hypertension   3. Chronic heart failure with preserved ejection fraction (HCC)    PLAN:     1.  Persistent atrial fibrillation..  CHA2DS2-VASc score is 80 (age, htn, gender).  Continue Toprol-XL 25 daily, Xarelto 15 mg daily.  Heart rate 107  2.  Blood pressure elevated,  continue Toprol-XL, increase torsemide as below.  3.  Patient with worsening lower extremity edema, echocardiogram with diastolic dysfunction grade 2.  Increase torsemide to 40 mg twice daily, increase KCl to 20 mg twice daily while diuresing.  Follow-up in 1 week.  Get BMP in 1 week.  Follow-up in 1 week.  Total encounter time more than 40 minutes  Greater than 50% was spent in counseling and coordination of care with the patient  This note was generated in part or whole with voice recognition software. Voice recognition is usually quite accurate but there are transcription errors that can and very often  do occur. I apologize for any typographical errors that were not detected and corrected.     Medication Adjustments/Labs and Tests Ordered: Current medicines are reviewed at length with the patient today.  Concerns regarding medicines are outlined above.  Orders Placed This Encounter  Procedures  . EKG 12-Lead   Meds ordered this encounter  Medications  . torsemide (DEMADEX) 20 MG tablet    Sig: Take 2 tablets (40 mg total) by mouth 2 (two) times daily.    Dispense:  120 tablet    Refill:  1  . potassium chloride (MICRO-K) 10 MEQ CR capsule    Sig: Take 2 capsules (20 mEq total) by mouth 2 (two) times daily.    Dispense:  120 capsule    Refill:  1    Patient Instructions  Medication Instructions:  Your physician has recommended you make the following change in your medication:   INCREASE Torsemide to 40 mg (2 tablets) twice daily. An Rx has been sent to your pharmacy.  INCREASE Potassium to 20 mEq (2 tablets) twice daily. An Rx has been sent to your pharmacy.  *If you need a refill on your cardiac medications before your next appointment, please call your pharmacy*   Lab Work: Your physician recommends that you return for lab work in: 1 week (bmet) same day as your next appointment  If you have labs (blood work) drawn today and your tests are completely normal, you will  receive your results only by: Marland Kitchen MyChart Message (if you have MyChart) OR . A paper copy in the mail If you have any lab test that is abnormal or we need to change your treatment, we will call you to review the results.   Testing/Procedures: None ordered   Follow-Up: At [facility], you and your health needs are our priority.  As part of our continuing mission to provide you with exceptional heart care, we have created designated Provider Care Teams.  These Care Teams include your primary Cardiologist (physician) and Advanced Practice Providers (APPs -  Physician Assistants and Nurse Practitioners) who all work together to provide you with the care you need, when you need it.  We recommend signing up for the patient portal called "MyChart".  Sign up information is provided on this After Visit Summary.  MyChart is used to connect with patients for Virtual Visits (Telemedicine).  Patients are able to view lab/test results, encounter notes, upcoming appointments, etc.  Non-urgent messages can be sent to your provider as well.   To learn more about what you can do with MyChart, go to [email or web address].    Your next appointment:   1 week  The format for your next appointment:   In Person  Provider:   Kate Sable, MD   Other Instructions N/A     Signed, Kate Sable, MD  04/27/2020 12:50 PM    [place]

## 2020-05-01 DIAGNOSIS — R9389 Abnormal findings on diagnostic imaging of other specified body structures: Secondary | ICD-10-CM | POA: Insufficient documentation

## 2020-05-03 DIAGNOSIS — I2510 Atherosclerotic heart disease of native coronary artery without angina pectoris: Secondary | ICD-10-CM

## 2020-05-03 HISTORY — DX: Atherosclerotic heart disease of native coronary artery without angina pectoris: I25.10

## 2020-05-07 ENCOUNTER — Inpatient Hospital Stay: Payer: Medicare HMO

## 2020-05-07 ENCOUNTER — Inpatient Hospital Stay: Payer: Medicare HMO | Attending: Hematology and Oncology

## 2020-05-07 ENCOUNTER — Other Ambulatory Visit: Payer: Self-pay

## 2020-05-07 ENCOUNTER — Ambulatory Visit: Payer: Medicare HMO

## 2020-05-07 ENCOUNTER — Other Ambulatory Visit: Payer: Medicare HMO

## 2020-05-07 DIAGNOSIS — E611 Iron deficiency: Secondary | ICD-10-CM | POA: Insufficient documentation

## 2020-05-07 DIAGNOSIS — E538 Deficiency of other specified B group vitamins: Secondary | ICD-10-CM | POA: Diagnosis not present

## 2020-05-07 DIAGNOSIS — C880 Waldenstrom macroglobulinemia: Secondary | ICD-10-CM | POA: Diagnosis present

## 2020-05-07 LAB — CBC WITH DIFFERENTIAL/PLATELET
Abs Immature Granulocytes: 0 10*3/uL (ref 0.00–0.07)
Basophils Absolute: 0 10*3/uL (ref 0.0–0.1)
Basophils Relative: 0 %
Eosinophils Absolute: 0.2 10*3/uL (ref 0.0–0.5)
Eosinophils Relative: 3 %
HCT: 34.8 % — ABNORMAL LOW (ref 36.0–46.0)
Hemoglobin: 10.8 g/dL — ABNORMAL LOW (ref 12.0–15.0)
Lymphocytes Relative: 30 %
Lymphs Abs: 1.9 10*3/uL (ref 0.7–4.0)
MCH: 24.3 pg — ABNORMAL LOW (ref 26.0–34.0)
MCHC: 31 g/dL (ref 30.0–36.0)
MCV: 78.4 fL — ABNORMAL LOW (ref 80.0–100.0)
Monocytes Absolute: 0.8 10*3/uL (ref 0.1–1.0)
Monocytes Relative: 13 %
Neutro Abs: 3.4 10*3/uL (ref 1.7–7.7)
Neutrophils Relative %: 54 %
Platelets: 122 10*3/uL — ABNORMAL LOW (ref 150–400)
RBC: 4.44 MIL/uL (ref 3.87–5.11)
RDW: 17.7 % — ABNORMAL HIGH (ref 11.5–15.5)
Smear Review: DECREASED
WBC: 6.3 10*3/uL (ref 4.0–10.5)
nRBC: 0 % (ref 0.0–0.2)

## 2020-05-07 MED ORDER — CYANOCOBALAMIN 1000 MCG/ML IJ SOLN
1000.0000 ug | Freq: Once | INTRAMUSCULAR | Status: AC
  Administered 2020-05-07: 1000 ug via INTRAMUSCULAR
  Filled 2020-05-07: qty 1

## 2020-05-07 NOTE — Progress Notes (Signed)
Patient called regarding iron labwork per Dr. Geralyn Flash, no answer. Will call again on Monday.

## 2020-05-08 ENCOUNTER — Ambulatory Visit (INDEPENDENT_AMBULATORY_CARE_PROVIDER_SITE_OTHER): Payer: Medicare HMO | Admitting: Cardiology

## 2020-05-08 ENCOUNTER — Other Ambulatory Visit: Payer: Self-pay | Admitting: Cardiology

## 2020-05-08 ENCOUNTER — Encounter: Payer: Self-pay | Admitting: Cardiology

## 2020-05-08 VITALS — BP 120/78 | HR 100 | Ht 65.0 in | Wt 198.4 lb

## 2020-05-08 DIAGNOSIS — I5189 Other ill-defined heart diseases: Secondary | ICD-10-CM

## 2020-05-08 DIAGNOSIS — I4891 Unspecified atrial fibrillation: Secondary | ICD-10-CM | POA: Diagnosis not present

## 2020-05-08 DIAGNOSIS — I10 Essential (primary) hypertension: Secondary | ICD-10-CM | POA: Diagnosis not present

## 2020-05-08 NOTE — Patient Instructions (Signed)
Medication Instructions:   Your physician recommends that you continue on your current medications as directed. Please refer to the Current Medication list given to you today.  *If you need a refill on your cardiac medications before your next appointment, please call your pharmacy*   Lab Work:  Your physician recommends that you have a lab draw today (BMP)  If you have labs (blood work) drawn today and your tests are completely normal, you will receive your results only by: Marland Kitchen MyChart Message (if you have MyChart) OR . A paper copy in the mail If you have any lab test that is abnormal or we need to change your treatment, we will call you to review the results.   Testing/Procedures: None Ordered   Follow-Up: At [facility], you and your health needs are our priority.  As part of our continuing mission to provide you with exceptional heart care, we have created designated Provider Care Teams.  These Care Teams include your primary Cardiologist (physician) and Advanced Practice Providers (APPs -  Physician Assistants and Nurse Practitioners) who all work together to provide you with the care you need, when you need it.  We recommend signing up for the patient portal called "MyChart".  Sign up information is provided on this After Visit Summary.  MyChart is used to connect with patients for Virtual Visits (Telemedicine).  Patients are able to view lab/test results, encounter notes, upcoming appointments, etc.  Non-urgent messages can be sent to your provider as well.   To learn more about what you can do with MyChart, go to [email or web address].    Your next appointment:   1 month(s)  The format for your next appointment:   In Person  Provider:   Kate Sable, MD   Other Instructions

## 2020-05-08 NOTE — Progress Notes (Signed)
Cardiology Office Note:    Date:  05/08/2020   ID:  Kristy Solomon, DOB 05-20-1929, MRN [id number]  PCP:  Jodi Marble, MD  Cardiologist:  Kate Sable, MD  Electrophysiologist:  None   Referring MD: Jodi Marble, MD   Chief Complaint  Patient presents with  . Follow-up    1 Week follow up, medications verbally reviewed with patient.     History of Present Illness:    Kristy Solomon is a 86 y.o. female with a hx of hypertension, heart failure preserved ejection fraction, paroxysmal atrial fibrillation on Eliquis, hyperlipidemia, lymphoma, renal insufficiency who presents for follow-up.    She was last seen due to lower extremity edema, torsemide was increased.  She states feeling about the same, her weight has decreased from last week/last visit.  Otherwise doing okay, has no concerns at this time.  Previous notes Cardiac monitor showed frequent ventricular ectopies, 20% burden.  Echo shows normal ejection fraction, grade 2 diastolic dysfunction.  Lower extremity edema noted on last visit.  Lasix 40 mg daily was started but patient states her edema has worsened.. prior EKG in the office showed atrial fibrillation.  Patient started on Toprol-XL and Eliquis.  Past Medical History:  Diagnosis Date  . 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 CHF (congestive heart failure) ([place])   . Hyperlipidemia   . Hypertension   . 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 Renal insufficiency     Past Surgical History:  Procedure Laterality Date  . ABDOMINAL HYSTERECTOMY    . HEMORRHOID SURGERY      Current Medications: Current Meds  Medication Sig  . albuterol (VENTOLIN HFA) 108 (90 Base) MCG/ACT inhaler Inhale 2 puffs into the lungs every 6 (six) hours as needed for wheezing or shortness of breath.  . allopurinol (ZYLOPRIM) 100 MG tablet Take 0.5 tablets (50 mg total) by mouth daily.  Marland Kitchen atorvastatin (LIPITOR) 20 MG tablet Take 20 mg by mouth daily.    Marland Kitchen gabapentin (NEURONTIN) 300 MG capsule Take 300 mg by mouth 3 (three) times daily.   . Ibrutinib (IMBRUVICA) 420 MG TABS Hold until outpatient oncology followup  . metoprolol succinate (TOPROL-XL) 25 MG 24 hr tablet Take 3 tablets (75 mg total) by mouth 2 (two) times daily.  . potassium chloride (MICRO-K) 10 MEQ CR capsule Take 2 capsules (20 mEq total) by mouth 2 (two) times daily.  . Rivaroxaban (XARELTO) 15 MG TABS tablet Take 1 tablet (15 mg total) by mouth daily with supper.  . torsemide (DEMADEX) 20 MG tablet Take 2 tablets (40 mg total) by mouth 2 (two) times daily.     Allergies:   Eliquis [apixaban]   Social History   Socioeconomic History  . Marital status: Widowed    Spouse name: Not on file  . Number of children: Not on file  . Years of education: Not on file  . Highest education level: Not on file  Occupational History  . Not on file  Tobacco Use  . Smoking status: Never Smoker  . Smokeless tobacco: Never Used  Vaping Use  . Vaping Use: Never used  Substance and Sexual Activity  . Alcohol use: No  . Drug use: No  . Sexual activity: Not on file  Other Topics Concern  . Not on file  Social History Narrative   Lives alone. Daughter assists as needed.   Social Determinants of Health   Financial Resource Strain:   . Difficulty of Paying Living Expenses:  Food Insecurity:   . Worried About Charity fundraiser in the Last Year:   . Arboriculturist in the Last Year:   Transportation Needs:   . Film/video editor (Medical):   Marland Kitchen Lack of Transportation (Non-Medical):   Physical Activity:   . Days of Exercise per Week:   . Minutes of Exercise per Session:   Stress:   . Feeling of Stress :   Social Connections:   . Frequency of Communication with Friends and Family:   . Frequency of Social Gatherings with Friends and Family:   . Attends Religious Services:   . Active Member of Clubs or Organizations:   . Attends Archivist Meetings:   Marland Kitchen Marital  Status:      Family History: The patient's family history includes Cancer in her brother and brother.  ROS:   Please see the history of present illness.     All other systems reviewed and are negative.  EKGs/Labs/Other Studies Reviewed:    The following studies were reviewed today:   EKG:  EKG is  ordered today.  The ekg ordered today demonstrates atrial fibrillation heart rate 100.   Recent Labs: 01/04/2020: TSH 3.817 01/24/2020: B Natriuretic Peptide 262.0 01/29/2020: Magnesium 2.5 04/23/2020: ALT 16; BUN 19; Creatinine, Ser 1.67; Potassium 3.7; Sodium 138 05/07/2020: Hemoglobin 10.8; Platelets 122  Recent Lipid Panel    Component Value Date/Time   CHOL 99 01/01/2020 0526   TRIG 52 01/01/2020 0526   HDL 47 01/01/2020 0526   CHOLHDL 2.1 01/01/2020 0526   VLDL 10 01/01/2020 0526   LDLCALC 42 01/01/2020 0526    Physical Exam:    VS:  BP 120/78 (BP Location: Right Arm, Patient Position: Sitting, Cuff Size: Normal)   Pulse 100   Ht 5\' 5"  (1.651 m)   Wt 198 lb 6.4 oz (90 kg)   SpO2 98%   BMI 33.02 kg/m     Wt Readings from Last 3 Encounters:  05/08/20 198 lb 6.4 oz (90 kg)  04/27/20 (!) 207 lb (93.9 kg)  04/27/20 (!) 207 lb 2 oz (94 kg)     GEN:  Well nourished, well developed in no acute distress HEENT: Normal NECK: No JVD; No carotid bruits LYMPHATICS: No lymphadenopathy CARDIAC: Regular bradycardic, occasional skipped beats., no murmurs, rubs, gallops RESPIRATORY:  Clear to auscultation without rales, wheezing or rhonchi  ABDOMEN: Soft, non-tender, distended MUSCULOSKELETAL:  2-3+ edema; No deformity  SKIN: Warm and dry NEUROLOGIC:  Alert and oriented x 3 PSYCHIATRIC:  Normal affect   ASSESSMENT:    1. Atrial fibrillation, unspecified type ([place])   2. Essential hypertension   3. Diastolic dysfunction    PLAN:     1.  Persistent atrial fibrillation.  CHA2DS2-VASc score is 50 (age, htn, gender).  Toprol-XL 25 daily, Xarelto   2.  Blood pressure controlled,  continue Toprol-XL, torsemide as below.  3.  Patient with grade 2 diastolic dysfunction,  lower extremity edema.  She has lost 9 pounds since last visit.  We will check BMP today.  Continue torsemide to 40 mg twice daily, KCl to 20 mg twice daily.  We will make adjustments to torsemide and or potassium depending on BMP results.  Follow-up in 1 month.  Total encounter time more than 40 minutes  Greater than 50% was spent in counseling and coordination of care with the patient  This note was generated in part or whole with voice recognition software. Voice recognition is usually quite accurate  but there are transcription errors that can and very often do occur. I apologize for any typographical errors that were not detected and corrected.     Medication Adjustments/Labs and Tests Ordered: Current medicines are reviewed at length with the patient today.  Concerns regarding medicines are outlined above.  Orders Placed This Encounter  Procedures  . Basic Metabolic Panel (BMET)  . EKG 12-Lead   No orders of the defined types were placed in this encounter.   Patient Instructions  Medication Instructions:   Your physician recommends that you continue on your current medications as directed. Please refer to the Current Medication list given to you today.  *If you need a refill on your cardiac medications before your next appointment, please call your pharmacy*   Lab Work:  Your physician recommends that you have a lab draw today (BMP)  If you have labs (blood work) drawn today and your tests are completely normal, you will receive your results only by: Marland Kitchen MyChart Message (if you have MyChart) OR . A paper copy in the mail If you have any lab test that is abnormal or we need to change your treatment, we will call you to review the results.   Testing/Procedures: None Ordered   Follow-Up: At [facility], you and your health needs are our priority.  As part of our continuing  mission to provide you with exceptional heart care, we have created designated Provider Care Teams.  These Care Teams include your primary Cardiologist (physician) and Advanced Practice Providers (APPs -  Physician Assistants and Nurse Practitioners) who all work together to provide you with the care you need, when you need it.  We recommend signing up for the patient portal called "MyChart".  Sign up information is provided on this After Visit Summary.  MyChart is used to connect with patients for Virtual Visits (Telemedicine).  Patients are able to view lab/test results, encounter notes, upcoming appointments, etc.  Non-urgent messages can be sent to your provider as well.   To learn more about what you can do with MyChart, go to [email or web address].    Your next appointment:   1 month(s)  The format for your next appointment:   In Person  Provider:   Kate Sable, MD   Other Instructions      Signed, Kate Sable, MD  05/08/2020 1:23 PM    [place]

## 2020-05-08 NOTE — Addendum Note (Signed)
Addended by: Janan Ridge on: 05/08/2020 03:03 PM   Modules accepted: Orders

## 2020-05-11 LAB — BASIC METABOLIC PANEL
BUN/Creatinine Ratio: 17 (ref 12–28)
BUN: 30 mg/dL — ABNORMAL HIGH (ref 8–27)
CO2: 25 mmol/L (ref 20–29)
Calcium: 8.9 mg/dL (ref 8.7–10.3)
Chloride: 103 mmol/L (ref 96–106)
Creatinine, Ser: 1.75 mg/dL — ABNORMAL HIGH (ref 0.57–1.00)
GFR calc Af Amer: 30 mL/min/{1.73_m2} — ABNORMAL LOW (ref >59)
GFR calc non Af Amer: 26 mL/min/{1.73_m2} — ABNORMAL LOW (ref >59)
Glucose: 80 mg/dL (ref 65–99)
Potassium: 4 mmol/L (ref 3.5–5.2)
Sodium: 141 mmol/L (ref 134–144)

## 2020-05-12 ENCOUNTER — Telehealth: Payer: Self-pay

## 2020-05-12 NOTE — Telephone Encounter (Signed)
The patient labs has been routed to the PCP, Cardiologist and Nephrologist office today.

## 2020-05-13 ENCOUNTER — Other Ambulatory Visit: Payer: Self-pay | Admitting: Hematology and Oncology

## 2020-05-14 ENCOUNTER — Telehealth: Payer: Self-pay

## 2020-05-14 NOTE — Telephone Encounter (Signed)
Spoke with the patient to inform her that she has appointment schedule for 05/18/2020 for the IV Infusion for Iron, the patient had concerns, which i explained to her what they will do for her visit. The patient was understanding and agreeable.

## 2020-05-18 ENCOUNTER — Inpatient Hospital Stay: Payer: Medicare HMO

## 2020-05-18 ENCOUNTER — Other Ambulatory Visit: Payer: Self-pay

## 2020-05-18 ENCOUNTER — Other Ambulatory Visit: Payer: Self-pay | Admitting: Hematology and Oncology

## 2020-05-18 DIAGNOSIS — E538 Deficiency of other specified B group vitamins: Secondary | ICD-10-CM

## 2020-05-18 DIAGNOSIS — C880 Waldenstrom macroglobulinemia: Secondary | ICD-10-CM | POA: Diagnosis not present

## 2020-05-18 MED ORDER — SODIUM CHLORIDE 0.9 % IV SOLN
Freq: Once | INTRAVENOUS | Status: AC
  Filled 2020-05-18: qty 250

## 2020-05-18 MED ORDER — SODIUM CHLORIDE 0.9 % IV SOLN: 200.0000 mg | Freq: Once | INTRAVENOUS | Status: DC

## 2020-05-18 MED ORDER — IRON SUCROSE 20 MG/ML IV SOLN
200.0000 mg | Freq: Once | INTRAVENOUS | Status: AC
  Administered 2020-05-18: 200 mg via INTRAVENOUS
  Filled 2020-05-18: qty 10

## 2020-05-20 ENCOUNTER — Inpatient Hospital Stay: Payer: Medicare HMO

## 2020-05-20 ENCOUNTER — Other Ambulatory Visit: Payer: Self-pay

## 2020-05-20 ENCOUNTER — Ambulatory Visit
Admission: RE | Admit: 2020-05-20 | Discharge: 2020-05-20 | Disposition: A | Discharge status: Home / Self Care | Payer: Medicare HMO | Source: Ambulatory Visit | Attending: Hematology and Oncology | Admitting: Hematology and Oncology

## 2020-05-20 ENCOUNTER — Other Ambulatory Visit: Payer: Self-pay | Admitting: Hematology and Oncology

## 2020-05-20 DIAGNOSIS — C8300 Small cell B-cell lymphoma, unspecified site: Secondary | ICD-10-CM

## 2020-05-20 DIAGNOSIS — R9389 Abnormal findings on diagnostic imaging of other specified body structures: Secondary | ICD-10-CM

## 2020-05-20 DIAGNOSIS — C8800 Waldenstrom macroglobulinemia not having achieved remission: Secondary | ICD-10-CM

## 2020-05-20 NOTE — Telephone Encounter (Signed)
CBC with Differential/Platelet Order: [id number] Status:  Final result Visible to patient:  No (inaccessible in MyChart) Next appt:  06/04/2020 at 12:30 PM in Oncology ([facility]) Dx:  [place] macroglobulinemia (HCC)  1 Result Note  Ref Range & Units 13 d ago 3 wk ago  WBC 4.0 - 10.5 K/uL 6.3  4.9   Comment: WHITE COUNT CONFIRMED ON SMEAR  RBC 3.87 - 5.11 MIL/uL 4.44  4.66   Hemoglobin 12.0 - 15.0 g/dL 10.8Low  11.3Low   Comment: Reticulocyte Hemoglobin testing  may be clinically indicated,  consider ordering this additional  test [id number]   HCT 36 - 46 % 34.8Low  37.8   MCV 80.0 - 100.0 fL 78.4Low  81.1   MCH 26.0 - 34.0 pg 24.3Low  24.2Low   MCHC 30.0 - 36.0 g/dL 31.0  29.9Low   RDW 11.5 - 15.5 % 17.7High  17.2High   Platelets 150 - 400 K/uL 122Low  109Low CM   nRBC 0.0 - 0.2 % 0.0  0.0   Neutrophils Relative % % 54  50   Neutro Abs 1.7 - 7.7 K/uL 3.4  2.4   Lymphocytes Relative % 30  38   Lymphs Abs 0.7 - 4.0 K/uL 1.9  1.8   Monocytes Relative % 13  11   Monocytes Absolute 0 - 1 K/uL 0.8  0.5   Eosinophils Relative % 3  0   Eosinophils Absolute 0 - 0 K/uL 0.2  0.0   Basophils Relative % 0  1   Basophils Absolute 0 - 0 K/uL 0.0  0.0   WBC Morphology  DIFF CONFIRMED BY MANUAL    RBC Morphology  MIXED RBC POPULATION    Smear Review  PLATELETS APPEAR DECREASED    Comment: PLATELETS VARY IN SIZE WITH LARGE BODY AND OCC GIANT PLATELETS NOTED  Abs Immature Granulocytes 0.00 - 0.07 K/uL 0.00  0.01 CM   Comment: Performed at [facility], [address] [place], [place] [address]  Immature Granulocytes   0 R   Resulting Agency  [facility] CLIN LAB [facility] LAB      Specimen Collected: 05/07/20 09:46 Last Resulted: 05/07/20 11:03

## 2020-06-01 MED FILL — IMBRUVICA 420 MG TAB: 420 | 28 days supply | Qty: 28 | Fill #0

## 2020-06-04 ENCOUNTER — Inpatient Hospital Stay: Payer: Medicare HMO

## 2020-06-04 ENCOUNTER — Inpatient Hospital Stay: Payer: Medicare HMO | Attending: Hematology and Oncology

## 2020-06-04 ENCOUNTER — Other Ambulatory Visit: Payer: Self-pay

## 2020-06-04 DIAGNOSIS — E538 Deficiency of other specified B group vitamins: Secondary | ICD-10-CM | POA: Diagnosis present

## 2020-06-04 MED ORDER — CYANOCOBALAMIN 1000 MCG/ML IJ SOLN
1000.0000 ug | Freq: Once | INTRAMUSCULAR | Status: AC
  Administered 2020-06-04: 1000 ug via INTRAMUSCULAR
  Filled 2020-06-04: qty 1

## 2020-06-04 MED ORDER — POTASSIUM CHLORIDE ER 10 MEQ PO CPCR: 20.0000 meq | ORAL_CAPSULE | Freq: Two times a day (BID) | ORAL | 3 refills | Status: AC

## 2020-06-04 MED ORDER — TORSEMIDE 20 MG PO TABS: 40.0000 mg | ORAL_TABLET | Freq: Two times a day (BID) | ORAL | 3 refills | Status: AC

## 2020-06-15 ENCOUNTER — Ambulatory Visit: Payer: Medicare HMO | Admitting: Cardiology

## 2020-06-16 ENCOUNTER — Encounter: Payer: Self-pay | Admitting: Cardiology

## 2020-06-19 ENCOUNTER — Emergency Department: Payer: Medicare HMO

## 2020-06-19 ENCOUNTER — Inpatient Hospital Stay
Admission: EM | Admit: 2020-06-19 | Discharge: 2020-08-03 | DRG: 291 | Disposition: E | Payer: Medicare HMO | Attending: Internal Medicine | Admitting: Internal Medicine

## 2020-06-19 ENCOUNTER — Other Ambulatory Visit: Payer: Self-pay

## 2020-06-19 DIAGNOSIS — I10 Essential (primary) hypertension: Secondary | ICD-10-CM | POA: Diagnosis not present

## 2020-06-19 DIAGNOSIS — I509 Heart failure, unspecified: Secondary | ICD-10-CM | POA: Diagnosis not present

## 2020-06-19 DIAGNOSIS — N186 End stage renal disease: Secondary | ICD-10-CM | POA: Diagnosis present

## 2020-06-19 DIAGNOSIS — I5033 Acute on chronic diastolic (congestive) heart failure: Secondary | ICD-10-CM

## 2020-06-19 DIAGNOSIS — R799 Abnormal finding of blood chemistry, unspecified: Secondary | ICD-10-CM

## 2020-06-19 DIAGNOSIS — D696 Thrombocytopenia, unspecified: Secondary | ICD-10-CM | POA: Diagnosis not present

## 2020-06-19 DIAGNOSIS — N184 Chronic kidney disease, stage 4 (severe): Secondary | ICD-10-CM | POA: Diagnosis not present

## 2020-06-19 DIAGNOSIS — I5042 Chronic combined systolic (congestive) and diastolic (congestive) heart failure: Secondary | ICD-10-CM | POA: Diagnosis not present

## 2020-06-19 DIAGNOSIS — Z90710 Acquired absence of both cervix and uterus: Secondary | ICD-10-CM | POA: Diagnosis not present

## 2020-06-19 DIAGNOSIS — Z515 Encounter for palliative care: Secondary | ICD-10-CM | POA: Diagnosis not present

## 2020-06-19 DIAGNOSIS — Z79899 Other long term (current) drug therapy: Secondary | ICD-10-CM | POA: Diagnosis not present

## 2020-06-19 DIAGNOSIS — Z6837 Body mass index (BMI) 37.0-37.9, adult: Secondary | ICD-10-CM

## 2020-06-19 DIAGNOSIS — R7989 Other specified abnormal findings of blood chemistry: Secondary | ICD-10-CM

## 2020-06-19 DIAGNOSIS — Z23 Encounter for immunization: Secondary | ICD-10-CM | POA: Diagnosis not present

## 2020-06-19 DIAGNOSIS — D6959 Other secondary thrombocytopenia: Secondary | ICD-10-CM | POA: Diagnosis present

## 2020-06-19 DIAGNOSIS — N189 Chronic kidney disease, unspecified: Secondary | ICD-10-CM | POA: Diagnosis not present

## 2020-06-19 DIAGNOSIS — Z8572 Personal history of non-Hodgkin lymphomas: Secondary | ICD-10-CM

## 2020-06-19 DIAGNOSIS — I5043 Acute on chronic combined systolic (congestive) and diastolic (congestive) heart failure: Secondary | ICD-10-CM | POA: Diagnosis present

## 2020-06-19 DIAGNOSIS — Z7189 Other specified counseling: Secondary | ICD-10-CM | POA: Diagnosis not present

## 2020-06-19 DIAGNOSIS — Z8616 Personal history of COVID-19: Secondary | ICD-10-CM

## 2020-06-19 DIAGNOSIS — N179 Acute kidney failure, unspecified: Secondary | ICD-10-CM | POA: Diagnosis present

## 2020-06-19 DIAGNOSIS — D509 Iron deficiency anemia, unspecified: Secondary | ICD-10-CM | POA: Diagnosis present

## 2020-06-19 DIAGNOSIS — I2510 Atherosclerotic heart disease of native coronary artery without angina pectoris: Secondary | ICD-10-CM | POA: Diagnosis present

## 2020-06-19 DIAGNOSIS — R601 Generalized edema: Secondary | ICD-10-CM

## 2020-06-19 DIAGNOSIS — Z9114 Patient's other noncompliance with medication regimen: Secondary | ICD-10-CM

## 2020-06-19 DIAGNOSIS — D631 Anemia in chronic kidney disease: Secondary | ICD-10-CM | POA: Diagnosis present

## 2020-06-19 DIAGNOSIS — I2729 Other secondary pulmonary hypertension: Secondary | ICD-10-CM | POA: Diagnosis present

## 2020-06-19 DIAGNOSIS — R400 Somnolence: Secondary | ICD-10-CM | POA: Diagnosis not present

## 2020-06-19 DIAGNOSIS — J9611 Chronic respiratory failure with hypoxia: Secondary | ICD-10-CM | POA: Diagnosis present

## 2020-06-19 DIAGNOSIS — I472 Ventricular tachycardia: Secondary | ICD-10-CM | POA: Diagnosis not present

## 2020-06-19 DIAGNOSIS — I4821 Permanent atrial fibrillation: Secondary | ICD-10-CM | POA: Diagnosis present

## 2020-06-19 DIAGNOSIS — Z9111 Patient's noncompliance with dietary regimen: Secondary | ICD-10-CM

## 2020-06-19 DIAGNOSIS — K219 Gastro-esophageal reflux disease without esophagitis: Secondary | ICD-10-CM | POA: Diagnosis present

## 2020-06-19 DIAGNOSIS — I5082 Biventricular heart failure: Secondary | ICD-10-CM | POA: Diagnosis present

## 2020-06-19 DIAGNOSIS — F419 Anxiety disorder, unspecified: Secondary | ICD-10-CM | POA: Diagnosis not present

## 2020-06-19 DIAGNOSIS — I248 Other forms of acute ischemic heart disease: Secondary | ICD-10-CM | POA: Diagnosis present

## 2020-06-19 DIAGNOSIS — E785 Hyperlipidemia, unspecified: Secondary | ICD-10-CM | POA: Diagnosis present

## 2020-06-19 DIAGNOSIS — Z9981 Dependence on supplemental oxygen: Secondary | ICD-10-CM | POA: Diagnosis not present

## 2020-06-19 DIAGNOSIS — I959 Hypotension, unspecified: Secondary | ICD-10-CM | POA: Diagnosis not present

## 2020-06-19 DIAGNOSIS — I132 Hypertensive heart and chronic kidney disease with heart failure and with stage 5 chronic kidney disease, or end stage renal disease: Secondary | ICD-10-CM | POA: Diagnosis present

## 2020-06-19 DIAGNOSIS — I4891 Unspecified atrial fibrillation: Secondary | ICD-10-CM | POA: Diagnosis present

## 2020-06-19 DIAGNOSIS — Z66 Do not resuscitate: Secondary | ICD-10-CM | POA: Diagnosis not present

## 2020-06-19 DIAGNOSIS — Z7901 Long term (current) use of anticoagulants: Secondary | ICD-10-CM

## 2020-06-19 DIAGNOSIS — R0602 Shortness of breath: Secondary | ICD-10-CM

## 2020-06-19 DIAGNOSIS — I272 Pulmonary hypertension, unspecified: Secondary | ICD-10-CM | POA: Diagnosis not present

## 2020-06-19 DIAGNOSIS — E8809 Other disorders of plasma-protein metabolism, not elsewhere classified: Secondary | ICD-10-CM | POA: Diagnosis not present

## 2020-06-19 DIAGNOSIS — E669 Obesity, unspecified: Secondary | ICD-10-CM | POA: Diagnosis present

## 2020-06-19 DIAGNOSIS — R0902 Hypoxemia: Secondary | ICD-10-CM

## 2020-06-19 DIAGNOSIS — C880 Waldenstrom macroglobulinemia: Secondary | ICD-10-CM | POA: Diagnosis not present

## 2020-06-19 DIAGNOSIS — I420 Dilated cardiomyopathy: Secondary | ICD-10-CM | POA: Diagnosis present

## 2020-06-19 DIAGNOSIS — I5031 Acute diastolic (congestive) heart failure: Secondary | ICD-10-CM | POA: Diagnosis not present

## 2020-06-19 DIAGNOSIS — N185 Chronic kidney disease, stage 5: Secondary | ICD-10-CM | POA: Diagnosis not present

## 2020-06-19 HISTORY — DX: Atherosclerosis of aorta: I70.0

## 2020-06-19 HISTORY — DX: Chronic kidney disease, stage 3 unspecified: N18.30

## 2020-06-19 HISTORY — DX: Unspecified diastolic (congestive) heart failure: I50.30

## 2020-06-19 HISTORY — DX: Permanent atrial fibrillation: I48.21

## 2020-06-19 LAB — BLOOD CULTURE ID PANEL (REFLEXED) - BCID2

## 2020-06-19 LAB — CBC
HCT: 41.7 % (ref 36.0–46.0)
Hemoglobin: 12.4 g/dL (ref 12.0–15.0)
MCH: 22.5 pg — ABNORMAL LOW (ref 26.0–34.0)
MCHC: 29.7 g/dL — ABNORMAL LOW (ref 30.0–36.0)
MCV: 75.7 fL — ABNORMAL LOW (ref 80.0–100.0)
Platelets: 98 10*3/uL — ABNORMAL LOW (ref 150–400)
RBC: 5.51 MIL/uL — ABNORMAL HIGH (ref 3.87–5.11)
RDW: 19.9 % — ABNORMAL HIGH (ref 11.5–15.5)
WBC: 4.7 10*3/uL (ref 4.0–10.5)
nRBC: 0 % (ref 0.0–0.2)

## 2020-06-19 LAB — BASIC METABOLIC PANEL
Anion gap: 11 (ref 5–15)
BUN: 38 mg/dL — ABNORMAL HIGH (ref 8–23)
CO2: 22 mmol/L (ref 22–32)
Calcium: 8.5 mg/dL — ABNORMAL LOW (ref 8.9–10.3)
Chloride: 112 mmol/L — ABNORMAL HIGH (ref 98–111)
Creatinine, Ser: 2.08 mg/dL — ABNORMAL HIGH (ref 0.44–1.00)
GFR calc Af Amer: 24 mL/min — ABNORMAL LOW (ref >60)
GFR calc non Af Amer: 21 mL/min — ABNORMAL LOW (ref >60)
Glucose, Bld: 111 mg/dL — ABNORMAL HIGH (ref 70–99)
Potassium: 4.3 mmol/L (ref 3.5–5.1)
Sodium: 145 mmol/L (ref 135–145)

## 2020-06-19 LAB — TSH: TSH: 2.923 u[IU]/mL (ref 0.350–4.500)

## 2020-06-19 LAB — TROPONIN I (HIGH SENSITIVITY)
Troponin I (High Sensitivity): 86 ng/L — ABNORMAL HIGH (ref <18)
Troponin I (High Sensitivity): 83 ng/L — ABNORMAL HIGH (ref <18)

## 2020-06-19 LAB — LACTIC ACID, PLASMA: Lactic Acid, Venous: 0.9 mmol/L (ref 0.5–1.9)

## 2020-06-19 LAB — SARS CORONAVIRUS 2 BY RT PCR (HOSPITAL ORDER, PERFORMED IN ~~LOC~~ HOSPITAL LAB): SARS Coronavirus 2: NEGATIVE

## 2020-06-19 LAB — T4, FREE: Free T4: 1.35 ng/dL — ABNORMAL HIGH (ref 0.61–1.12)

## 2020-06-19 MED ORDER — IPRATROPIUM-ALBUTEROL 0.5-2.5 (3) MG/3ML IN SOLN: 3.0000 mL | Freq: Four times a day (QID) | RESPIRATORY_TRACT | Status: DC | PRN

## 2020-06-19 MED ORDER — SODIUM CHLORIDE 0.9% FLUSH
3.0000 mL | Freq: Two times a day (BID) | INTRAVENOUS | Status: DC
  Administered 2020-06-19 – 2020-07-08 (×37): 3 mL via INTRAVENOUS

## 2020-06-19 MED ORDER — FUROSEMIDE 10 MG/ML IJ SOLN
40.0000 mg | Freq: Two times a day (BID) | INTRAMUSCULAR | Status: DC
  Administered 2020-06-19 – 2020-06-20 (×2): 40 mg via INTRAVENOUS
  Filled 2020-06-19 (×2): qty 4

## 2020-06-19 MED ORDER — METOPROLOL SUCCINATE ER 50 MG PO TB24
75.0000 mg | ORAL_TABLET | Freq: Two times a day (BID) | ORAL | Status: DC
  Administered 2020-06-19 – 2020-06-20 (×3): 75 mg via ORAL
  Filled 2020-06-19 (×2): qty 2
  Filled 2020-06-19: qty 1

## 2020-06-19 MED ORDER — METOPROLOL SUCCINATE ER 50 MG PO TB24
25.0000 mg | ORAL_TABLET | Freq: Every day | ORAL | Status: DC
  Filled 2020-06-19: qty 1

## 2020-06-19 MED ORDER — SODIUM CHLORIDE 0.9% FLUSH: 3.0000 mL | INTRAVENOUS | Status: DC | PRN

## 2020-06-19 MED ORDER — RIVAROXABAN 15 MG PO TABS
15.0000 mg | ORAL_TABLET | Freq: Every day | ORAL | Status: DC
  Administered 2020-06-19 – 2020-06-28 (×10): 15 mg via ORAL
  Filled 2020-06-19 (×11): qty 1

## 2020-06-19 MED ORDER — GABAPENTIN 300 MG PO CAPS
300.0000 mg | ORAL_CAPSULE | Freq: Three times a day (TID) | ORAL | Status: DC
  Administered 2020-06-19 – 2020-07-07 (×52): 300 mg via ORAL
  Filled 2020-06-19 (×53): qty 1

## 2020-06-19 MED ORDER — METOPROLOL TARTRATE 5 MG/5ML IV SOLN
2.5000 mg | Freq: Once | INTRAVENOUS | Status: AC
  Administered 2020-06-19: 2.5 mg via INTRAVENOUS
  Filled 2020-06-19: qty 5

## 2020-06-19 MED ORDER — POTASSIUM CHLORIDE CRYS ER 10 MEQ PO TBCR
10.0000 meq | EXTENDED_RELEASE_TABLET | Freq: Two times a day (BID) | ORAL | Status: DC
  Administered 2020-06-19 – 2020-06-20 (×3): 10 meq via ORAL
  Filled 2020-06-19 (×3): qty 1

## 2020-06-19 MED ORDER — ATORVASTATIN CALCIUM 20 MG PO TABS
20.0000 mg | ORAL_TABLET | Freq: Every day | ORAL | Status: DC
  Administered 2020-06-19 – 2020-07-07 (×18): 20 mg via ORAL
  Filled 2020-06-19 (×18): qty 1

## 2020-06-19 MED ORDER — ONDANSETRON HCL 4 MG/2ML IJ SOLN: 4.0000 mg | Freq: Four times a day (QID) | INTRAMUSCULAR | Status: DC | PRN

## 2020-06-19 MED ORDER — FUROSEMIDE 10 MG/ML IJ SOLN
60.0000 mg | Freq: Once | INTRAMUSCULAR | Status: AC
  Administered 2020-06-19: 60 mg via INTRAVENOUS
  Filled 2020-06-19: qty 8

## 2020-06-19 MED ORDER — IBRUTINIB 420 MG PO TABS: ORAL_TABLET | Freq: Every day | ORAL | Status: DC

## 2020-06-19 MED ORDER — FUROSEMIDE 10 MG/ML IJ SOLN: 40.0000 mg | Freq: Two times a day (BID) | INTRAMUSCULAR | Status: DC

## 2020-06-19 MED ORDER — SODIUM CHLORIDE 0.9 % IV SOLN: 250.0000 mL | INTRAVENOUS | Status: DC | PRN

## 2020-06-19 MED ORDER — ACETAMINOPHEN 325 MG PO TABS
650.0000 mg | ORAL_TABLET | ORAL | Status: DC | PRN
  Administered 2020-06-27 – 2020-06-29 (×3): 650 mg via ORAL
  Filled 2020-06-19 (×3): qty 2

## 2020-06-19 MED ORDER — ALLOPURINOL 100 MG PO TABS
50.0000 mg | ORAL_TABLET | Freq: Every day | ORAL | Status: DC
  Administered 2020-06-19 – 2020-07-07 (×18): 50 mg via ORAL
  Filled 2020-06-19 (×21): qty 0.5

## 2020-06-19 NOTE — ED Triage Notes (Signed)
Patient coming [facility] from home for SOB. Patient oxygen stuation 100% on 3L (patient on 2L at baseline), tachypneic. HR afib at 170. Patient given 2.5 mg metoprolol IV, HR decreased to 102. BP 150/90. Patient has a hx of afib, does not take medications for it.

## 2020-06-19 NOTE — ED Notes (Signed)
Called dietary as pt's dinner tray missing.

## 2020-06-19 NOTE — ED Notes (Signed)
Will give zyloprim once received from pharm.

## 2020-06-19 NOTE — Consult Note (Signed)
Cardiology Consult    Patient ID: Kristy Solomon MRN: [id number], DOB/AGE: 04-16-1929   Admit date: 06/06/2020 Date of Consult: 06/05/2020  Primary Physician: Jodi Marble, MD Primary Cardiologist: Kate Sable, MD Requesting Provider: Milon Dikes, MD  Patient Profile    Kristy Solomon is a 86 y.o. female with a history of permanent atrial fibrillation on Eliquis (CHA2DS2VASc-6), HFpEF, stage III-IV chronic kidney disease, hypertension, hyperlipidemia, prior COVID-19 infection (April 2021), coronary and aortic atherosclerosis by CT, and lymphoma who is being seen today for the evaluation of acute on chronic heart failure in the setting of medication noncompliance at the request of Dr. Francine Graven.  Past Medical History   Past Medical History:  Diagnosis Date  . (HFpEF) heart failure with preserved ejection fraction ([place])    a. 12/2019 Echo: EF 50-55%, no rwma, mild LVH. Nl RV fxn. Mildly BAE. Mild MR, mild-mod TR.  Marland Kitchen Anemia in neoplastic disease 06/27/2014  . Aortic atherosclerosis ([place])    a. Seen on CT 05/2020.  Marland Kitchen CKD (chronic kidney disease), stage III-IV   . Coronary artery calcification seen on CT scan 05/2020  . COVID-19 virus infection 01/2020  . Hyperlipidemia   . Hypertension   . 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 Permanent atrial fibrillation (HCC)    a. CHA2DS2VASc = 6-->eliquis.    Past Surgical History:  Procedure Laterality Date  . ABDOMINAL HYSTERECTOMY    . HEMORRHOID SURGERY       Allergies  Allergies  Allergen Reactions  . Eliquis [Apixaban]     abd pain    History of Present Illness    86 year old female with the above complex past medical history including permanent atrial fibrillation on Eliquis, HFpEF, stage III-IV chronic kidney disease, hypertension, hyperlipidemia, prior Covid infection in April 2021, coronary and aortic atherosclerosis by CT, and lymphoma.  She was previously diagnosed with paroxysmal atrial  fibrillation but since January, she has been more persistent  permanent and rate controlled on oral beta-blocker therapy.  She is supposed to be anticoagulated with Eliquis in the setting of a CHA2DS2-VASc of 6.  Echocardiogram in March of this year showed an EF of 50-55% without regional wall motion abnormalities and mild LVH.  No significant valvular abnormalities.  She was admitted in April of this year with COVID-19 infection and subsequently discharged to rehab on oxygen therapy.  She has remained on oxygen via nasal cannula ever since then.    She was discharged from rehab in late June and was receiving home health assistance with bathing until about 2 weeks ago.  She was last seen in cardiology clinic on August 6, at which time she continued complain of lower extremity swelling which had improved slightly.  She supposed be on torsemide therapy however, she notes that over the past 1 to 3 months, she has been out of most of her medications including torsemide, metoprolol, and Eliquis.  In this setting, she has noted increasing lower extremity swelling, dyspnea on exertion, and orthopnea over the past 5 to 6 months.  She lives by herself locally and has a daughter who helps with shopping but overall, her activity is very limited.  At this point, she is not able to bathe herself.  She is not weighing herself daily.  On the evening of September 16, her on the evening of September 16, she noted that her oxygen tank had emptied and she was not getting adequate air resulting in increasing dyspnea.  For this reason, she called  EMS.  She was found to be in rapid atrial fibrillation at rates in the 170s.  She was treated with IV metoprolol in route with improvement in heart rate to the low 100s.  Here, chest x-ray shows CHF.  High-sensitivity troponin is mildly elevated at 86  83.  Creatinine is mildly elevated above her baseline of 2.08 (1.75 August 6).  She is currently in no distress.  Inpatient Medications      . allopurinol  50 mg Oral Daily  . atorvastatin  20 mg Oral Daily  . furosemide  40 mg Intravenous Q12H  . furosemide  60 mg Intravenous Once  . gabapentin  300 mg Oral TID  . Ibrutinib   Oral Daily  . metoprolol succinate  25 mg Oral Daily  . metoprolol succinate  75 mg Oral BID  . potassium chloride  10 mEq Oral BID  . Rivaroxaban  15 mg Oral Q supper  . sodium chloride flush  3 mL Intravenous Q12H    Family History    Family History  Problem Relation Age of Onset  . Cancer Brother        throat ca  . Cancer Brother        bone cancer   She indicated that her mother is deceased. She indicated that her father is deceased. She indicated that both of her brothers are deceased.   Social History    Social History   Socioeconomic History  . Marital status: Widowed    Spouse name: Not on file  . Number of children: Not on file  . Years of education: Not on file  . Highest education level: Not on file  Occupational History  . Not on file  Tobacco Use  . Smoking status: Never Smoker  . Smokeless tobacco: Never Used  Vaping Use  . Vaping Use: Never used  Substance and Sexual Activity  . Alcohol use: No  . Drug use: No  . Sexual activity: Not on file  Other Topics Concern  . Not on file  Social History Narrative   Lives alone. Daughter assists as needed. Activity very limited by DOE and lower ext edema/wkns.   Social Determinants of Health   Financial Resource Strain:   . Difficulty of Paying Living Expenses: Not on file  Food Insecurity:   . Worried About Charity fundraiser in the Last Year: Not on file  . Ran Out of Food in the Last Year: Not on file  Transportation Needs:   . Lack of Transportation (Medical): Not on file  . Lack of Transportation (Non-Medical): Not on file  Physical Activity:   . Days of Exercise per Week: Not on file  . Minutes of Exercise per Session: Not on file  Stress:   . Feeling of Stress : Not on file  Social Connections:   .  Frequency of Communication with Friends and Family: Not on file  . Frequency of Social Gatherings with Friends and Family: Not on file  . Attends Religious Services: Not on file  . Active Member of Clubs or Organizations: Not on file  . Attends Archivist Meetings: Not on file  . Marital Status: Not on file  Intimate Partner Violence:   . Fear of Current or Ex-Partner: Not on file  . Emotionally Abused: Not on file  . Physically Abused: Not on file  . Sexually Abused: Not on file     Review of Systems    General:  No chills, fever,  night sweats or weight changes.  Cardiovascular:  +++ Occasional chest heaviness, +++ dyspnea on exertion, +++ massive edema, +++ orthopnea-sleeps in recliner, no palpitations, paroxysmal nocturnal dyspnea. Dermatological: No rash, lesions/masses Respiratory: No cough, +++ dyspnea Urologic: No hematuria, dysuria Abdominal:   No nausea, vomiting, diarrhea, bright red blood per rectum, melena, or hematemesis Neurologic:  No visual changes, +++ wkns, changes in mental status. All other systems reviewed and are otherwise negative except as noted above.  Physical Exam    Blood pressure (!) 150/116, pulse (!) 121, temperature 98.8 F (37.1 C), temperature source Oral, resp. rate (!) 26, height 5\' 5"  (1.651 m), weight 93.9 kg, SpO2 100 %.  General: Pleasant, NAD Psych: Normal affect. Neuro: Alert and oriented X 3. Moves all extremities spontaneously. HEENT: Normal  Neck: Supple.  Moderately elevated JVP.  No bruits. Lungs:  Resp regular and unlabored, diminished breath sounds throughout with bibasilar crackles approximately one third of the way up. Heart: Irregularly irregular, tachycardic, no s3, s4, or murmurs. Abdomen: Obese, protuberant, and edematous.  Nontender.  BS + x 4.  Extremities: No clubbing or cyanosis.  3+ bilateral lower extremity edema extending to the waist/flanks. DP/PT/Radials 1+ and equal bilaterally.  Labs    Cardiac  Enzymes Recent Labs  Lab 06/03/2020 0117 06/18/2020 0340  TROPONINIHS 86* 83*      Lab Results  Component Value Date   WBC 4.7 06/07/2020   HGB 12.4 06/16/2020   HCT 41.7 06/18/2020   MCV 75.7 (L) 06/15/2020   PLT 98 (L) 06/28/2020    Recent Labs  Lab 06/04/2020 0117  NA 145  K 4.3  CL 112*  CO2 22  BUN 38*  CREATININE 2.08*  CALCIUM 8.5*  GLUCOSE 111*   Lab Results  Component Value Date   CHOL 99 01/01/2020   HDL 47 01/01/2020   LDLCALC 42 01/01/2020   TRIG 52 01/01/2020   No results found for: [facility]   Radiology Studies    DG Chest 2 View  Result Date: 06/22/2020 CLINICAL DATA:  Shortness of breath EXAM: CHEST - 2 VIEW COMPARISON:  01/24/2020, CT 05/20/2020 FINDINGS: Small moderate bilateral pleural effusions left greater than right. Basilar airspace consolidations. Cardiomegaly with vascular congestion. Aortic atherosclerosis. No pneumothorax. IMPRESSION: Cardiomegaly with vascular congestion, small to moderate bilateral pleural effusions left greater than right, and bibasilar airspace consolidations which may reflect atelectasis or pneumonia Electronically Signed   By: Donavan Foil M.D.   On: 07/01/2020 01:54    ECG & Cardiac Imaging    AFib, 101, LAD, PVCs, LVH, poor R progression - personally reviewed.  Assessment & Plan    1.  Acute on chronic HF w/ preserved EF:  Echo in 12/2019 w/ EF of 50-55 %, mild LVH.  Patient notes that over the past 3 to 5 months, she has been dealing with increasing lower extremity edema, increased abdominal girth, and inc DOE.  Over the past 1-3 months, she has been out of her metoprolol, eliquis, and torsemide, among others - she isn't actually sure, but says that she should be taking 5-8 pills per day and is currently only taking 2-3.  Her O2 tank ran out of O2 last night, which prompted her to call EMS.  She was found to be in rapid afib on arrival and in CHF by CXR in the ED.  She is massively volume overloaded.  HRs better w/  resumption of  blocker.  Agree w/ aggressive IV diuresis and close monitoring of renal fxn.  2.  Permanent Atrial Fibrillation:  Rate elevated on arrival in the setting of noncompliance w/ metoprolol over the past month or more. HR was stable when seen as outpt in Aug.  Suspect tachycardia both driven by volume excess and partially driving worsened diastolic dysfxn.  Cont  blocker therapy and titrate as tolerated.  Prev Rx eliquis.  Redued dose Xarelto ordered here.  Perhaps once/day dosing will improve compliance.  3.  Essential HTN:  BP elevated in the setting of above.  Cont diuresis and  blocker and titrate as necessary.  4.  HL: Cont statin therapy.  LDL 42 in March.  5.  Lymphoma: followed by oncology.  Cont home med.  6. CKD III-IV:  Follow w/ diuresis.  7.  Elevated HsTroponin:  In setting of #1 and 2.  Suspect demand ischemia as she has known cor Ca2+ on prior CT.  No active c/p.  She does report chest heaviness but notes that it is generally persistent throughout the day and attributes to not being able to get a good breath.  She has never undergone ischemic eval.  ECG w/o acute ST/T changes. Repeat echo to eval EF.  With advanced age and CKD III-IV, she is a poor candidate for cath, though we could consider a nuc study at some point to risk stratify and guide treatment.  Cont  blocker and statin rx.  No ASA in setting of xarelto.  Signed, Murray Hodgkins, NP 06/23/2020, 11:12 AM  For questions or updates, please contact   Please consult [email or web address] for contact info under Cardiology/STEMI.

## 2020-06-19 NOTE — ED Notes (Signed)
Mimi RN to bedside to check on pt. Old lunch tray at bedside. Dietary states new tray on the way.

## 2020-06-19 NOTE — ED Notes (Signed)
Dinner tray noted at bedside. Pt prompted to eat as has not touched tray yet.

## 2020-06-19 NOTE — ED Notes (Signed)
Patient was assisted to call daughter on phone.

## 2020-06-19 NOTE — ED Notes (Signed)
Patient was given diet ginger ale and crackers. Report given to [facility]

## 2020-06-19 NOTE — ED Notes (Signed)
Repositioned pt; placed pillow under left hip; placed socks on feet as well.

## 2020-06-19 NOTE — ED Notes (Signed)
Pt asleep upon this RN and Georgie RN's entrance to room. Easily woken. Repositioned. Oxygen cannula adjusted for pt. Pt currently on 3L.

## 2020-06-19 NOTE — ED Notes (Signed)
Patient ate approximately 1/2 of noon meal. Purewick is in place.

## 2020-06-19 NOTE — Progress Notes (Signed)
PHARMACY - PHYSICIAN COMMUNICATION CRITICAL VALUE ALERT - BLOOD CULTURE IDENTIFICATION (BCID)  Kristy Solomon is an 86 y.o. female who presented to [facility] on 06/14/2020 with a chief complaint of acute on chronic heart failure with preserved LVEF  Assessment: 2/4 bottles (anaerobic and aerobic bottles) from the same set. Staphylococcus species - S. Epidermidis. MecA detected. WBC WNL. Afebrile. Possibly a contaminant.   Name of physician (or Provider) Contacted: Dr. Francine Graven.   Current antibiotics: None.   Changes to prescribed antibiotics recommended: No antibiotics at this time.  Recommendations accepted by provider  Results for orders placed or performed during the hospital encounter of 06/11/2020  Blood Culture ID Panel (Reflexed) (Collected: 06/18/2020  3:40 AM)  Result Value Ref Range   Enterococcus faecalis NOT DETECTED NOT DETECTED   Enterococcus Faecium NOT DETECTED NOT DETECTED   Listeria monocytogenes NOT DETECTED NOT DETECTED   Staphylococcus species DETECTED (A) NOT DETECTED   Staphylococcus aureus (BCID) NOT DETECTED NOT DETECTED   Staphylococcus epidermidis DETECTED (A) NOT DETECTED   Staphylococcus lugdunensis NOT DETECTED NOT DETECTED   Streptococcus species NOT DETECTED NOT DETECTED   Streptococcus agalactiae NOT DETECTED NOT DETECTED   Streptococcus pneumoniae NOT DETECTED NOT DETECTED   Streptococcus pyogenes NOT DETECTED NOT DETECTED   A.calcoaceticus-baumannii NOT DETECTED NOT DETECTED   Bacteroides fragilis NOT DETECTED NOT DETECTED   Enterobacterales NOT DETECTED NOT DETECTED   Enterobacter cloacae complex NOT DETECTED NOT DETECTED   Escherichia coli NOT DETECTED NOT DETECTED   Klebsiella aerogenes NOT DETECTED NOT DETECTED   Klebsiella oxytoca NOT DETECTED NOT DETECTED   Klebsiella pneumoniae NOT DETECTED NOT DETECTED   Proteus species NOT DETECTED NOT DETECTED   Salmonella species NOT DETECTED NOT DETECTED   Serratia marcescens NOT DETECTED NOT DETECTED    Haemophilus influenzae NOT DETECTED NOT DETECTED   Neisseria meningitidis NOT DETECTED NOT DETECTED   Pseudomonas aeruginosa NOT DETECTED NOT DETECTED   Stenotrophomonas maltophilia NOT DETECTED NOT DETECTED   Candida albicans NOT DETECTED NOT DETECTED   Candida auris NOT DETECTED NOT DETECTED   Candida glabrata NOT DETECTED NOT DETECTED   Candida krusei NOT DETECTED NOT DETECTED   Candida parapsilosis NOT DETECTED NOT DETECTED   Candida tropicalis NOT DETECTED NOT DETECTED   Cryptococcus neoformans/gattii NOT DETECTED NOT DETECTED   Methicillin resistance mecA/C DETECTED (A) NOT DETECTED    Rowland Lathe 06/05/2020  6:57 PM

## 2020-06-19 NOTE — H&P (Signed)
History and Physical    Kristy Solomon:[id number] DOB: September 16, 1929 DOA: 06/06/2020  PCP: Jodi Marble, MD   Patient coming from: Home  I have personally briefly reviewed patient's old medical records in [place]  Chief Complaint: Shortness of breath  HPI: Kristy Solomon is a 86 y.o. female with medical history significant for chronic respiratory failure on 2 L of oxygen continuous, chronic diastolic dysfunction CHF, atrial fibrillation on chronic anticoagulation therapy, hypertension with complications of chronic kidney disease and history of malignant lymphoma who presented to the emergency room via EMS for evaluation of worsening shortness of breath.  Patient stated that she had had to increase her oxygen to 3 L from 2 L due to her symptoms.  Upon arrival to the emergency room she was noted to be tachycardic and was in rapid A. fib with heart rate of 170bpm.  She received a dose of IV metoprolol 2.5 mg with improvement in her heart rate. Patient complains of worsening shortness of breath and is short of breath at rest associated with orthopnea and bilateral lower extremity swelling.  She also complains of palpitations but denies having any chest pain, no diaphoresis, no nausea, no vomiting, no abdominal pain or any changes in her bowel habits. Labs show sodium 145, potassium 4.3, chloride 112, bicarb 22, BUN 38, creatinine 2.08, calcium 8.5, white count 4.7, hemoglobin 12.4, hematocrit 41.7, MCV 75, RDW 19, platelet count 98, TSH 2.93, T4 1.35 Chest x-ray reviewed by me shows bilateral pleural effusions Twelve-lead EKG reviewed by me shows A. fib ventricular rate, left axis deviation.   ED Course: Patient is a 86 year old African-American female with a history of atrial fibrillation on anticoagulation therapy, history of chronic diastolic dysfunction CHF and hypertension who was brought into the ER by EMS for evaluation of worsening shortness of breath and was noted to be in  rapid A. Fib, her heart rate was 170bpm when she arrived in the ER and improved following administration of metoprolol.  Chest x-ray is consistent with CHF and patient also received a dose of IV Lasix.  She will be admitted to the hospital for further evaluation.    Review of Systems: As per HPI otherwise 10 point review of systems negative.    Past Medical History:  Diagnosis Date  . (HFpEF) heart failure with preserved ejection fraction ([place])    a. 12/2019 Echo: EF 50-55%, no rwma, mild LVH. Nl RV fxn. Mildly BAE. Mild MR, mild-mod TR.  Marland Kitchen Anemia in neoplastic disease 06/27/2014  . Aortic atherosclerosis ([place])    a. Seen on CT 05/2020.  Marland Kitchen CKD (chronic kidney disease), stage III-IV   . Coronary artery calcification seen on CT scan 05/2020  . COVID-19 virus infection 01/2020  . Hyperlipidemia   . Hypertension   . 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 Permanent atrial fibrillation (HCC)    a. CHA2DS2VASc = 6-->eliquis.    Past Surgical History:  Procedure Laterality Date  . ABDOMINAL HYSTERECTOMY    . HEMORRHOID SURGERY       reports that she has never smoked. She has never used smokeless tobacco. She reports that she does not drink alcohol and does not use drugs.  Allergies  Allergen Reactions  . Eliquis [Apixaban]     abd pain    Family History  Problem Relation Age of Onset  . Cancer Brother        throat ca  . Cancer Brother  bone cancer     Prior to Admission medications   Medication Sig Start Date End Date Taking? Authorizing Provider  allopurinol (ZYLOPRIM) 100 MG tablet Take 0.5 tablets (50 mg total) by mouth daily. 04/23/20  Yes Corcoran, Drue Second, MD  atorvastatin (LIPITOR) 20 MG tablet Take 20 mg by mouth daily. 12/24/13  Yes [provider]  gabapentin (NEURONTIN) 300 MG capsule Take 300 mg by mouth 3 (three) times daily.    Yes [provider]  Ibrutinib (IMBRUVICA) 420 MG TABS TAKE 1 TABLET (420 MG) BY MOUTH DAILY.  05/20/20  Yes Corcoran, Drue Second, MD  metoprolol succinate (TOPROL-XL) 25 MG 24 hr tablet Take 3 tablets (75 mg total) by mouth 2 (two) times daily. 01/09/20  Yes Fritzi Mandes, MD  potassium chloride (MICRO-K) 10 MEQ CR capsule Take 2 capsules (20 mEq total) by mouth 2 (two) times daily. 06/04/20  Yes Kate Sable, MD  Rivaroxaban (XARELTO) 15 MG TABS tablet Take 1 tablet (15 mg total) by mouth daily with supper. 01/09/20  Yes Fritzi Mandes, MD  torsemide (DEMADEX) 20 MG tablet Take 2 tablets (40 mg total) by mouth 2 (two) times daily. 06/04/20  Yes Agbor-Etang, Aaron Edelman, MD  albuterol (VENTOLIN HFA) 108 (90 Base) MCG/ACT inhaler Inhale 2 puffs into the lungs every 6 (six) hours as needed for wheezing or shortness of breath. 01/09/20   Fritzi Mandes, MD    Physical Exam: Vitals:   06/09/2020 0410 06/13/2020 0543 06/03/2020 0636 06/09/2020 1150  BP: (!) 163/84 (!) 150/98 (!) 150/116 (!) 150/95  Pulse: (!) 102 (!) 101 (!) 121 (!) 112  Resp: 20 17 (!) 26 (!) 22  Temp: 98.8 F (37.1 C)     TempSrc: Oral     SpO2: 100% 100% 100% 95%  Weight:      Height:         Vitals:   06/13/2020 0410 06/12/2020 0543 06/07/2020 0636 06/10/2020 1150  BP: (!) 163/84 (!) 150/98 (!) 150/116 (!) 150/95  Pulse: (!) 102 (!) 101 (!) 121 (!) 112  Resp: 20 17 (!) 26 (!) 22  Temp: 98.8 F (37.1 C)     TempSrc: Oral     SpO2: 100% 100% 100% 95%  Weight:      Height:        Constitutional: NAD, alert and oriented x 3 Eyes: PERRL, lids and conjunctivae pallor ENMT: Mucous membranes are moist.  Neck: normal, supple, no masses, no thyromegaly Respiratory: Crackles at both bases bilaterally, no wheezing. Normal respiratory effort. No accessory muscle use.  Cardiovascular: Tachycardic, irregularly irregular no murmurs / rubs / gallops. 3+ extremity edema. 2+ pedal pulses. No carotid bruits.  Abdomen: no tenderness, no masses palpated. No hepatosplenomegaly. Bowel sounds positive.  Central adiposity Musculoskeletal: no clubbing /  cyanosis. No joint deformity upper and lower extremities.  Skin: no rashes, lesions, ulcers.  Neurologic: No gross focal neurologic deficit. Psychiatric: Normal mood and affect.   Labs on Admission: I have personally reviewed following labs and imaging studies  CBC: Recent Labs  Lab 07/02/2020 0117  WBC 4.7  HGB 12.4  HCT 41.7  MCV 75.7*  PLT 98*   Basic Metabolic Panel: Recent Labs  Lab 06/20/2020 0117  NA 145  K 4.3  CL 112*  CO2 22  GLUCOSE 111*  BUN 38*  CREATININE 2.08*  CALCIUM 8.5*   GFR: Estimated Creatinine Clearance: 20.8 mL/min (A) (by C-G formula based on SCr of 2.08 mg/dL (H)). Liver Function Tests: No results for input(s):  AST, ALT, ALKPHOS, BILITOT, PROT, ALBUMIN in the last 168 hours. No results for input(s): LIPASE, AMYLASE in the last 168 hours. No results for input(s): AMMONIA in the last 168 hours. Coagulation Profile: No results for input(s): INR, PROTIME in the last 168 hours. Cardiac Enzymes: No results for input(s): CKTOTAL, CKMB, CKMBINDEX, TROPONINI in the last 168 hours. BNP (last 3 results) No results for input(s): PROBNP in the last 8760 hours. HbA1C: No results for input(s): HGBA1C in the last 72 hours. CBG: No results for input(s): GLUCAP in the last 168 hours. Lipid Profile: No results for input(s): CHOL, HDL, LDLCALC, TRIG, CHOLHDL, LDLDIRECT in the last 72 hours. Thyroid Function Tests: Recent Labs    06/26/2020 0117  TSH 2.923  FREET4 1.35*   Anemia Panel: No results for input(s): VITAMINB12, FOLATE, FERRITIN, TIBC, IRON, RETICCTPCT in the last 72 hours. Urine analysis:    Component Value Date/Time   COLORURINE AMBER (A) 01/24/2020 0106   APPEARANCEUR HAZY (A) 01/24/2020 0106   APPEARANCEUR Cloudy (A) 12/24/2019 1354   LABSPEC 1.024 01/24/2020 0106   LABSPEC 1.019 04/12/2014 0111   PHURINE 5.0 01/24/2020 0106   GLUCOSEU NEGATIVE 01/24/2020 0106   GLUCOSEU Negative 04/12/2014 0111   HGBUR SMALL (A) 01/24/2020 0106    BILIRUBINUR SMALL (A) 01/24/2020 0106   BILIRUBINUR Negative 12/24/2019 1354   BILIRUBINUR Negative 04/12/2014 0111   KETONESUR NEGATIVE 01/24/2020 0106   PROTEINUR >=300 (A) 01/24/2020 0106   NITRITE NEGATIVE 01/24/2020 0106   LEUKOCYTESUR TRACE (A) 01/24/2020 0106   LEUKOCYTESUR 2+ 04/12/2014 0111    Radiological Exams on Admission: DG Chest 2 View  Result Date: 06/23/2020 CLINICAL DATA:  Shortness of breath EXAM: CHEST - 2 VIEW COMPARISON:  01/24/2020, CT 05/20/2020 FINDINGS: Small moderate bilateral pleural effusions left greater than right. Basilar airspace consolidations. Cardiomegaly with vascular congestion. Aortic atherosclerosis. No pneumothorax. IMPRESSION: Cardiomegaly with vascular congestion, small to moderate bilateral pleural effusions left greater than right, and bibasilar airspace consolidations which may reflect atelectasis or pneumonia Electronically Signed   By: Donavan Foil M.D.   On: 06/04/2020 01:54    EKG: Independently reviewed.  Atrial fibrillation with rapid ventricular rate  Assessment/Plan Principal Problem:   Acute on chronic heart failure with preserved ejection fraction (HFpEF) (HCC) Active Problems:   Waldenstrom macroglobulinemia (HCC)   HTN (hypertension)   GERD (gastroesophageal reflux disease)   Atrial fibrillation with rapid ventricular response (HCC)   Non compliance w medication regimen     Acute on chronic heart failure with preserved LVEF Most likely secondary to dietary indiscretion, medication noncompliance, patient has been out of her medications for a couple days as well as rapid atrial fibrillation She lives alone and is unable to go to the grocery store pharmacy Continue metoprolol for rate control Place patient on Lasix 40 mg IV twice daily Maintain low-sodium diet  Hypertension with complications of stage IV chronic kidney disease Optimize blood pressure control Continue metoprolol  Atrial fibrillation with rapid  ventricular rate Patient was in rapid A. fib which arrived to the emergency room with improvement following administration of IV metoprolol Continue metoprolol for rate control ??  Hyperthyroidism, since patient has increased free T4 levels May need to be started on methimazole Continue Xarelto as primary prophylaxis for an acute stroke   History of lymphoma Continue Ibrutinib   DVT prophylaxis: Xarelto Code Status: Full code Family Communication: Greater than 50% of time was spent discussing plan of care with patient at the bedside.  She verbalizes understanding and  agrees with the plan.  CODE STATUS was discussed and she is a full code Disposition Plan: Back to previous home environment Consults called: Nephrology/cardiology    Storie Heffern MD [facility]     06/20/2020, 11:58 AM

## 2020-06-19 NOTE — ED Provider Notes (Addendum)
[facility] Emergency Department Provider Note   ____________________________________________   First MD Initiated Contact with Patient 06/12/2020 0802     (approximate)  I have reviewed the triage vital signs and the nursing notes.   HISTORY  Chief Complaint Shortness of Breath    Kristy Solomon is a 86 y.o. female who comes in by EMS for shortness of breath.  She normally is on 2 L of oxygen but is required at least 3 last day or 2.  She reports increased edema in her legs and increased shortness of breath.  On arrival her heart rate was up to 170.  Dr. Beather Arbour gave her 2-1/2 mg of metoprolol IV and decreased her heart rate to 102.  Patient normally takes torsemide and according to her cardiology records Toprol-XL 25 once a day.  She reports she is taking her medicine.      Past Medical History:  Diagnosis Date  . 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 CHF (congestive heart failure) ([place])   . Hyperlipidemia   . Hypertension   . 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 Renal insufficiency     Patient Active Problem List   Diagnosis Date Noted  . Abnormal CXR 05/01/2020  . Palliative care by specialist   . Atrial fibrillation with rapid ventricular response ([place])   . Pneumonia due to COVID-19 virus 01/24/2020  . Renal mass, left 01/13/2020  . Persistent atrial fibrillation ([place])   . Chronic kidney disease, stage 3b   . Acute on chronic heart failure with preserved ejection fraction (HFpEF) ([place]) 01/06/2020  . Acute decompensated heart failure ([place]) 01/03/2020  . Acute on chronic diastolic CHF (congestive heart failure) ([place]) 12/31/2019  . CKD (chronic kidney disease), stage IV ([place]) 12/31/2019  . Atrial fibrillation, chronic ([place]) 12/31/2019  . Elevated troponin 12/31/2019  . Renal insufficiency 07/28/2019  . Bilateral edema of lower extremity 05/07/2019  . HLD (hyperlipidemia) 09/18/2018  . HTN (hypertension) 09/18/2018   . GERD (gastroesophageal reflux disease) 09/18/2018  . Back pain 09/18/2018  . Bradycardia 09/18/2018  . Bigeminy 09/18/2018  . Nephrolithiasis 01/11/2018  . Goals of care, counseling/discussion 12/28/2017  . Gastroenteritis 12/28/2017  . B12 deficiency 03/18/2016  . Folate deficiency 03/18/2016  . Iron deficiency anemia 03/18/2016  . Pneumonia 03/17/2016  . Pressure ulcer 03/17/2016  . Thrombocytopenia ([place]) 07/04/2014  . Acute kidney injury superimposed on chronic kidney disease ([place]) 07/04/2014  . Hyperuricemia 07/04/2014  . 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 Waldenstrom macroglobulinemia ([place]) 12/27/2013  . Calculi, ureter 07/03/2012  . Frank hematuria 07/03/2012  . Hydronephrosis 07/03/2012  . 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 Calculus of kidney 07/02/2012  . Nonspecific finding on examination of urine 07/02/2012    Past Surgical History:  Procedure Laterality Date  . ABDOMINAL HYSTERECTOMY    . HEMORRHOID SURGERY      Prior to Admission medications   Medication Sig Start Date End Date Taking? Authorizing Provider  albuterol (VENTOLIN HFA) 108 (90 Base) MCG/ACT inhaler Inhale 2 puffs into the lungs every 6 (six) hours as needed for wheezing or shortness of breath. 01/09/20   Fritzi Mandes, MD  allopurinol (ZYLOPRIM) 100 MG tablet Take 0.5 tablets (50 mg total) by mouth daily. 04/23/20   Lequita Asal, MD  atorvastatin (LIPITOR) 20 MG tablet Take 20 mg by mouth daily. 12/24/13  [provider]  gabapentin (NEURONTIN) 300 MG capsule Take 300 mg by mouth 3 (three) times daily.     [provider]  Ibrutinib (IMBRUVICA) 420 MG TABS TAKE 1 TABLET (420 MG) BY MOUTH DAILY. 05/20/20   Lequita Asal, MD  metoprolol succinate (TOPROL-XL) 25 MG 24 hr tablet Take 3 tablets (75 mg total) by mouth 2 (two)  times daily. 01/09/20   Fritzi Mandes, MD  potassium chloride (MICRO-K) 10 MEQ CR capsule Take 2 capsules (20 mEq total) by mouth 2 (two) times daily. 06/04/20   Kate Sable, MD  Rivaroxaban (XARELTO) 15 MG TABS tablet Take 1 tablet (15 mg total) by mouth daily with supper. 01/09/20   Fritzi Mandes, MD  torsemide (DEMADEX) 20 MG tablet Take 2 tablets (40 mg total) by mouth 2 (two) times daily. 06/04/20   Kate Sable, MD    Allergies Eliquis [apixaban]  Family History  Problem Relation Age of Onset  . Cancer Brother        throat ca  . Cancer Brother        bone cancer    Social History Social History   Tobacco Use  . Smoking status: Never Smoker  . Smokeless tobacco: Never Used  Vaping Use  . Vaping Use: Never used  Substance Use Topics  . Alcohol use: No  . Drug use: No    Review of Systems  Constitutional: No fever/chills Eyes: No visual changes. ENT: No sore throat. Cardiovascular: Denies chest pain. Respiratory:shortness of breath. Gastrointestinal: No abdominal pain.  No nausea, no vomiting.  No diarrhea.  No constipation. Genitourinary: Negative for dysuria. Musculoskeletal: Negative for back pain. Skin: Negative for rash. Neurological: Negative for headaches, focal weakness   ____________________________________________   PHYSICAL EXAM:  VITAL SIGNS: ED Triage Vitals [06/03/2020 0113]  Enc Vitals Group     BP (!) 142/104     Pulse Rate (!) 101     Resp (!) 24     Temp 98.2 F (36.8 C)     Temp Source Oral     SpO2 100 %     Weight 207 lb (93.9 kg)     Height 5\' 5"  (1.651 m)     Head Circumference      Peak Flow      Pain Score 0     Pain Loc      Pain Edu?      Excl. in [place]?     Constitutional: Alert and oriented. Well appearing and in no acute distress. Eyes: Conjunctivae are normal.  Head: Atraumatic. Nose: No congestion/rhinnorhea. Mouth/Throat: Mucous membranes are moist.  Oropharynx non-erythematous. Neck: No stridor.     Cardiovascular: Rapid rate, irregularly irregular rhythm. Grossly normal heart sounds.  Good peripheral circulation. Respiratory: Somewhat increased respiratory effort.  No retractions. Lungs crackles halfway up Gastrointestinal: Soft and nontender. No distention. No abdominal bruits.  Musculoskeletal: No lower extremity tenderness Marked bilateral edema.   Neurologic:  Normal speech and language. No gross focal neurologic deficits are appreciated.  Skin:  Skin is warm, dry and intact. No rash noted. Psychiatric: Mood and affect are normal. Speech and behavior are normal.  ____________________________________________   LABS (all labs ordered are listed, but only abnormal results are displayed)  Labs Reviewed  BASIC METABOLIC PANEL - Abnormal; Notable for the following components:      Result Value   Chloride 112 (*)    Glucose, Bld 111 (*)    BUN 38 (*)    Creatinine, Ser  2.08 (*)    Calcium 8.5 (*)    GFR calc non Af Amer 21 (*)    GFR calc Af Amer 24 (*)    All other components within normal limits  CBC - Abnormal; Notable for the following components:   RBC 5.51 (*)    MCV 75.7 (*)    MCH 22.5 (*)    MCHC 29.7 (*)    RDW 19.9 (*)    Platelets 98 (*)    All other components within normal limits  T4, FREE - Abnormal; Notable for the following components:   Free T4 1.35 (*)    All other components within normal limits  TROPONIN I (HIGH SENSITIVITY) - Abnormal; Notable for the following components:   Troponin I (High Sensitivity) 86 (*)    All other components within normal limits  TROPONIN I (HIGH SENSITIVITY) - Abnormal; Notable for the following components:   Troponin I (High Sensitivity) 83 (*)    All other components within normal limits  CULTURE, BLOOD (ROUTINE X 2)  CULTURE, BLOOD (ROUTINE X 2)  SARS CORONAVIRUS 2 BY RT PCR (HOSPITAL ORDER, [place] [facility])  LACTIC ACID, PLASMA  TSH  LACTIC ACID, PLASMA    ____________________________________________  EKG  EKG read interpreted by me shows A. fib at rate of 101 left axis occasional PVCs decreased R wave progression but no sign of STEMI ____________________________________________  RADIOLOGY  ED MD interpretation: Chest x-ray read by radiology reviewed by me looks like cardiomegaly with CHF.  Likely bilateral effusions  Official radiology report(s): DG Chest 2 View  Result Date: 07/02/2020 CLINICAL DATA:  Shortness of breath EXAM: CHEST - 2 VIEW COMPARISON:  01/24/2020, CT 05/20/2020 FINDINGS: Small moderate bilateral pleural effusions left greater than right. Basilar airspace consolidations. Cardiomegaly with vascular congestion. Aortic atherosclerosis. No pneumothorax. IMPRESSION: Cardiomegaly with vascular congestion, small to moderate bilateral pleural effusions left greater than right, and bibasilar airspace consolidations which may reflect atelectasis or pneumonia Electronically Signed   By: Donavan Foil M.D.   On: 07/01/2020 01:54    ____________________________________________   PROCEDURES  Procedure(s) performed (including Critical Care):  Procedures   ____________________________________________   INITIAL IMPRESSION / ASSESSMENT AND PLAN / ED COURSE  Patient with increased oxygen requirement A. fib with RVR although this is easily controlled with low-dose IV metoprolol.  Patient has markedly increased edema in her chest x-ray shows what appears to be worsened cardiomegaly with effusions bilaterally.  Her TSH is elevated but her troponin is not rising.  We will get a coronavirus test and plan on admitting her for stabilization of her worsening CHF              ____________________________________________   FINAL CLINICAL IMPRESSION(S) / ED DIAGNOSES  Final diagnoses:  Hypoxia  Acute on chronic congestive heart failure, unspecified heart failure type ([place])  Elevated TSH     ED Discharge Orders    None       *Please note:  GENESE QUEBEDEAUX was evaluated in Emergency Department on 06/23/2020 for the symptoms described in the history of present illness. She was evaluated in the context of the global COVID-19 pandemic, which necessitated consideration that the patient might be at risk for infection with the SARS-CoV-2 virus that causes COVID-19. Institutional protocols and algorithms that pertain to the evaluation of patients at risk for COVID-19 are in a state of rapid change based on information released by regulatory bodies including the CDC and federal and state organizations. These  policies and algorithms were followed during the patient's care in the ED.  Some ED evaluations and interventions may be delayed as a result of limited staffing during and the pandemic.*   Note:  This document was prepared using Dragon voice recognition software and may include unintentional dictation errors.    Nena Polio, MD 06/27/2020 [id number]    Nena Polio, MD 06/14/2020 [phone]

## 2020-06-19 NOTE — ED Notes (Signed)
Resting at present  Denies any complaints

## 2020-06-20 DIAGNOSIS — I4821 Permanent atrial fibrillation: Secondary | ICD-10-CM

## 2020-06-20 LAB — BASIC METABOLIC PANEL
Anion gap: 8 (ref 5–15)
BUN: 40 mg/dL — ABNORMAL HIGH (ref 8–23)
CO2: 22 mmol/L (ref 22–32)
Calcium: 8.6 mg/dL — ABNORMAL LOW (ref 8.9–10.3)
Chloride: 110 mmol/L (ref 98–111)
Creatinine, Ser: 1.95 mg/dL — ABNORMAL HIGH (ref 0.44–1.00)
GFR calc Af Amer: 26 mL/min — ABNORMAL LOW (ref >60)
GFR calc non Af Amer: 22 mL/min — ABNORMAL LOW (ref >60)
Glucose, Bld: 111 mg/dL — ABNORMAL HIGH (ref 70–99)
Potassium: 4.7 mmol/L (ref 3.5–5.1)
Sodium: 140 mmol/L (ref 135–145)

## 2020-06-20 LAB — PROCALCITONIN: Procalcitonin: <0.1 ng/mL

## 2020-06-20 LAB — BRAIN NATRIURETIC PEPTIDE: B Natriuretic Peptide: 672.1 pg/mL — ABNORMAL HIGH (ref 0.0–100.0)

## 2020-06-20 MED ORDER — INFLUENZA VAC A&B SA ADJ QUAD 0.5 ML IM PRSY
0.5000 mL | PREFILLED_SYRINGE | INTRAMUSCULAR | Status: AC
  Administered 2020-06-21: 0.5 mL via INTRAMUSCULAR
  Filled 2020-06-20 (×2): qty 0.5

## 2020-06-20 MED ORDER — PNEUMOCOCCAL VAC POLYVALENT 25 MCG/0.5ML IJ INJ
0.5000 mL | INJECTION | INTRAMUSCULAR | Status: AC
  Administered 2020-06-21: 0.5 mL via INTRAMUSCULAR
  Filled 2020-06-20: qty 0.5

## 2020-06-20 MED ORDER — METOPROLOL SUCCINATE ER 100 MG PO TB24
100.0000 mg | ORAL_TABLET | Freq: Every day | ORAL | Status: DC
  Administered 2020-06-21 – 2020-06-23 (×3): 100 mg via ORAL
  Filled 2020-06-20 (×4): qty 1

## 2020-06-20 MED ORDER — FUROSEMIDE 10 MG/ML IJ SOLN
40.0000 mg | Freq: Three times a day (TID) | INTRAMUSCULAR | Status: DC
  Administered 2020-06-20 – 2020-06-21 (×3): 40 mg via INTRAVENOUS
  Filled 2020-06-20 (×3): qty 4

## 2020-06-20 NOTE — ED Notes (Signed)
Maricar RN from 2A to call this RN back soon.

## 2020-06-20 NOTE — Consult Note (Signed)
[facility]  CONSULT NOTE    Date: 06/20/2020                  Patient Name:  Kristy Solomon  MRN: [id number]  DOB: 27-Feb-1929  Age / Sex: 86 y.o., female         PCP: Jodi Marble, MD                 Service Requesting Consult: Dr. Sabino Gasser                 Reason for Consult: Acute renal failure            History of Present Illness: Ms. SHIORI ADCOX presented to [facility] ED with tachycardia and found to be in atrial fibrillation with rapid ventricular response. She had significant peripheral and pulmonary edema. Found to have bilateral pleural effusions. She has not taking her medications for several days. She states she has no transportation.  Uses 2 liters of oxygen at home, Here she is on 5 liters.   Medications: Outpatient medications: Medications Prior to Admission  Medication Sig Dispense Refill Last Dose  . allopurinol (ZYLOPRIM) 100 MG tablet Take 0.5 tablets (50 mg total) by mouth daily. 30 tablet 1   . atorvastatin (LIPITOR) 20 MG tablet Take 20 mg by mouth daily.     Marland Kitchen gabapentin (NEURONTIN) 300 MG capsule Take 300 mg by mouth 3 (three) times daily.      . Ibrutinib (IMBRUVICA) 420 MG TABS TAKE 1 TABLET (420 MG) BY MOUTH DAILY. 28 tablet 2   . metoprolol succinate (TOPROL-XL) 25 MG 24 hr tablet Take 3 tablets (75 mg total) by mouth 2 (two) times daily. 60 tablet 0   . potassium chloride (MICRO-K) 10 MEQ CR capsule Take 2 capsules (20 mEq total) by mouth 2 (two) times daily. 360 capsule 3   . Rivaroxaban (XARELTO) 15 MG TABS tablet Take 1 tablet (15 mg total) by mouth daily with supper. 42 tablet 0   . torsemide (DEMADEX) 20 MG tablet Take 2 tablets (40 mg total) by mouth 2 (two) times daily. 360 tablet 3   . albuterol (VENTOLIN HFA) 108 (90 Base) MCG/ACT inhaler Inhale 2 puffs into the lungs every 6 (six) hours as needed for wheezing or shortness of breath. 8 g 0 PRN at PRN    Current medications: Current Facility-Administered  Medications  Medication Dose Route Frequency Provider Last Rate Last Admin  . 0.9 %  sodium chloride infusion  250 mL Intravenous PRN Agbata, Tochukwu, MD      . acetaminophen (TYLENOL) tablet 650 mg  650 mg Oral Q4H PRN Agbata, Tochukwu, MD      . allopurinol (ZYLOPRIM) tablet 50 mg  50 mg Oral Daily Agbata, Tochukwu, MD   50 mg at 06/20/20 0905  . atorvastatin (LIPITOR) tablet 20 mg  20 mg Oral Daily Agbata, Tochukwu, MD   20 mg at 06/20/20 0905  . furosemide (LASIX) injection 40 mg  40 mg Intravenous Q12H Lu Duffel, RPH   40 mg at 06/20/20 0904  . gabapentin (NEURONTIN) capsule 300 mg  300 mg Oral TID Agbata, Tochukwu, MD   300 mg at 06/20/20 0905  . Ibrutinib TABS   Oral Daily Agbata, Tochukwu, MD      . Derrill Memo ON 06/21/2020] influenza vaccine adjuvanted (FLUAD) injection 0.5 mL  0.5 mL Intramuscular Tomorrow-1000 Agbata, Tochukwu, MD      . ipratropium-albuterol (DUONEB) 0.5-2.5 (3) MG/3ML nebulizer solution  3 mL  3 mL Nebulization Q6H PRN Agbata, Tochukwu, MD      . Derrill Memo ON 06/21/2020] metoprolol succinate (TOPROL-XL) 24 hr tablet 100 mg  100 mg Oral Daily Agbor-Etang, Aaron Edelman, MD      . ondansetron (ZOFRAN) injection 4 mg  4 mg Intravenous Q6H PRN Agbata, Tochukwu, MD      . Derrill Memo ON 06/21/2020] pneumococcal 23 valent vaccine (PNEUMOVAX-23) injection 0.5 mL  0.5 mL Intramuscular Tomorrow-1000 Agbata, Tochukwu, MD      . potassium chloride SA (KLOR-CON) CR tablet 10 mEq  10 mEq Oral BID Agbata, Tochukwu, MD   10 mEq at 06/20/20 0916  . Rivaroxaban (XARELTO) tablet 15 mg  15 mg Oral Q supper Agbata, Tochukwu, MD   15 mg at 06/24/2020 1648  . sodium chloride flush (NS) 0.9 % injection 3 mL  3 mL Intravenous Q12H Agbata, Tochukwu, MD   3 mL at 06/20/20 0916  . sodium chloride flush (NS) 0.9 % injection 3 mL  3 mL Intravenous PRN Agbata, Tochukwu, MD       Facility-Administered Medications Ordered in Other Encounters  Medication Dose Route Frequency Provider Last Rate Last Admin  .  cyanocobalamin ((VITAMIN B-12)) injection 1,000 mcg  1,000 mcg Intramuscular Once Lequita Asal, MD          Allergies: Allergies  Allergen Reactions  . Eliquis [Apixaban]     abd pain      Past Medical History: Past Medical History:  Diagnosis Date  . (HFpEF) heart failure with preserved ejection fraction ([place])    a. 12/2019 Echo: EF 50-55%, no rwma, mild LVH. Nl RV fxn. Mildly BAE. Mild MR, mild-mod TR.  Marland Kitchen Anemia in neoplastic disease 06/27/2014  . Aortic atherosclerosis ([place])    a. Seen on CT 05/2020.  Marland Kitchen CKD (chronic kidney disease), stage III-IV   . Coronary artery calcification seen on CT scan 05/2020  . COVID-19 virus infection 01/2020  . Hyperlipidemia   . Hypertension   . Malignant lymphoma, lymphoplasmacytic ([place]) 12/27/2013  . Permanent atrial fibrillation (HCC)    a. CHA2DS2VASc = 6-->eliquis.     Past Surgical History: Past Surgical History:  Procedure Laterality Date  . ABDOMINAL HYSTERECTOMY    . HEMORRHOID SURGERY       Family History: Family History  Problem Relation Age of Onset  . Cancer Brother        throat ca  . Cancer Brother        bone cancer     Social History: Social History   Socioeconomic History  . Marital status: Widowed    Spouse name: Not on file  . Number of children: Not on file  . Years of education: Not on file  . Highest education level: Not on file  Occupational History  . Not on file  Tobacco Use  . Smoking status: Never Smoker  . Smokeless tobacco: Never Used  Vaping Use  . Vaping Use: Never used  Substance and Sexual Activity  . Alcohol use: No  . Drug use: No  . Sexual activity: Not on file  Other Topics Concern  . Not on file  Social History Narrative   Lives alone. Daughter assists as needed. Activity very limited by DOE and lower ext edema/wkns.   Social Determinants of Health   Financial Resource Strain:   . Difficulty of Paying Living Expenses: Not on file  Food Insecurity:   . Worried  About Charity fundraiser in the Last Year: Not on  file  . [place] in the Last Year: Not on file  Transportation Needs:   . Lack of Transportation (Medical): Not on file  . Lack of Transportation (Non-Medical): Not on file  Physical Activity:   . Days of Exercise per Week: Not on file  . Minutes of Exercise per Session: Not on file  Stress:   . Feeling of Stress : Not on file  Social Connections:   . Frequency of Communication with Friends and Family: Not on file  . Frequency of Social Gatherings with Friends and Family: Not on file  . Attends Religious Services: Not on file  . Active Member of Clubs or Organizations: Not on file  . Attends Archivist Meetings: Not on file  . Marital Status: Not on file  Intimate Partner Violence:   . Fear of Current or Ex-Partner: Not on file  . Emotionally Abused: Not on file  . Physically Abused: Not on file  . Sexually Abused: Not on file     Review of Systems: Review of Systems  Constitutional: Negative.   HENT: Negative.   Eyes: Negative.   Respiratory: Positive for cough, shortness of breath and wheezing. Negative for hemoptysis and sputum production.   Cardiovascular: Positive for orthopnea, leg swelling and PND. Negative for chest pain, palpitations and claudication.  Gastrointestinal: Negative.   Genitourinary: Negative.  Negative for dysuria, flank pain, frequency, hematuria and urgency.  Musculoskeletal: Negative for back pain, falls, joint pain, myalgias and neck pain.  Skin: Negative.   Neurological: Negative.   Endo/Heme/Allergies: Negative.   Psychiatric/Behavioral: Negative.     Vital Signs: Blood pressure 107/89, pulse 88, temperature 97.7 F (36.5 C), temperature source Oral, resp. rate 18, height 5\' 5"  (1.651 m), weight 101.9 kg, SpO2 100 %.  Weight trends: Filed Weights   06/14/2020 0113 06/20/20 0304  Weight: 93.9 kg 101.9 kg    Physical Exam: General: NAD, sitting up in bed  Head:  Normocephalic, atraumatic. Moist oral mucosal membranes  Eyes: Anicteric, PERRL  Neck: Supple, trachea midline  Lungs:  Bilateral crackles  Heart: irregular  Abdomen:  Soft, nontender, obese  Extremities:  +++ peripheral edema.  Neurologic: Nonfocal, moving all four extremities  Skin: No lesions         Lab results: Basic Metabolic Panel: Recent Labs  Lab 06/16/2020 0117 06/20/20 0636  NA 145 140  K 4.3 4.7  CL 112* 110  CO2 22 22  GLUCOSE 111* 111*  BUN 38* 40*  CREATININE 2.08* 1.95*  CALCIUM 8.5* 8.6*    Liver Function Tests: No results for input(s): AST, ALT, ALKPHOS, BILITOT, PROT, ALBUMIN in the last 168 hours. No results for input(s): LIPASE, AMYLASE in the last 168 hours. No results for input(s): AMMONIA in the last 168 hours.  CBC: Recent Labs  Lab 07/02/2020 0117  WBC 4.7  HGB 12.4  HCT 41.7  MCV 75.7*  PLT 98*    Cardiac Enzymes: No results for input(s): CKTOTAL, CKMB, CKMBINDEX, TROPONINI in the last 168 hours.  BNP: Invalid input(s): POCBNP  CBG: No results for input(s): GLUCAP in the last 168 hours.  Microbiology: Results for orders placed or performed during the hospital encounter of 06/30/2020  Culture, blood (routine x 2)     Status: None (Preliminary result)   Collection Time: 06/30/2020  3:40 AM   Specimen: BLOOD  Result Value Ref Range Status   Specimen Description BLOOD LEFT [facility]  Final   Special Requests   Final  BOTTLES DRAWN AEROBIC AND ANAEROBIC Blood Culture adequate volume   Culture  Setup Time   Final    GRAM POSITIVE COCCI CRITICAL VALUE NOTED.  VALUE IS CONSISTENT WITH PREVIOUSLY REPORTED AND CALLED VALUE. Performed at [facility], [address] [place], [place] [address]    Culture [facility] POSITIVE COCCI  Final   Report Status PENDING  Incomplete  Culture, blood (routine x 2)     Status: None (Preliminary result)   Collection Time: 06/07/2020  3:40 AM   Specimen: BLOOD  Result Value Ref Range Status   Specimen  Description BLOOD RIGHT FA  Final   Special Requests   Final    BOTTLES DRAWN AEROBIC AND ANAEROBIC Blood Culture adequate volume   Culture  Setup Time   Final    Organism ID to follow GRAM POSITIVE COCCI IN BOTH AEROBIC AND ANAEROBIC BOTTLES CRITICAL RESULT CALLED TO, READ BACK BY AND VERIFIED WITH: SUSAN WATSON AT 1696 06/06/2020.PMF Performed at [facility], [address] [place], [place] [address]    Culture [facility] POSITIVE COCCI  Final   Report Status PENDING  Incomplete  Blood Culture ID Panel (Reflexed)     Status: Abnormal   Collection Time: 06/07/2020  3:40 AM  Result Value Ref Range Status   Enterococcus faecalis NOT DETECTED NOT DETECTED Final   Enterococcus Faecium NOT DETECTED NOT DETECTED Final   Listeria monocytogenes NOT DETECTED NOT DETECTED Final   Staphylococcus species DETECTED (A) NOT DETECTED Final    Comment: CRITICAL RESULT CALLED TO, READ BACK BY AND VERIFIED WITH: SUSAN WATSON AT 1017 07/02/2020.PMF    Staphylococcus aureus (BCID) NOT DETECTED NOT DETECTED Final   Staphylococcus epidermidis DETECTED (A) NOT DETECTED Final    Comment: Methicillin (oxacillin) resistant coagulase negative staphylococcus. Possible blood culture contaminant (unless isolated from more than one blood culture draw or clinical case suggests pathogenicity). No antibiotic treatment is indicated for blood  culture contaminants. CRITICAL RESULT CALLED TO, READ BACK BY AND VERIFIED WITH: SUSAN WATSON AT 5102 06/03/2020.PMF    Staphylococcus lugdunensis NOT DETECTED NOT DETECTED Final   Streptococcus species NOT DETECTED NOT DETECTED Final   Streptococcus agalactiae NOT DETECTED NOT DETECTED Final   Streptococcus pneumoniae NOT DETECTED NOT DETECTED Final   Streptococcus pyogenes NOT DETECTED NOT DETECTED Final   A.calcoaceticus-baumannii NOT DETECTED NOT DETECTED Final   Bacteroides fragilis NOT DETECTED NOT DETECTED Final   Enterobacterales NOT DETECTED NOT DETECTED Final    Enterobacter cloacae complex NOT DETECTED NOT DETECTED Final   Escherichia coli NOT DETECTED NOT DETECTED Final   Klebsiella aerogenes NOT DETECTED NOT DETECTED Final   Klebsiella oxytoca NOT DETECTED NOT DETECTED Final   Klebsiella pneumoniae NOT DETECTED NOT DETECTED Final   Proteus species NOT DETECTED NOT DETECTED Final   Salmonella species NOT DETECTED NOT DETECTED Final   Serratia marcescens NOT DETECTED NOT DETECTED Final   Haemophilus influenzae NOT DETECTED NOT DETECTED Final   Neisseria meningitidis NOT DETECTED NOT DETECTED Final   Pseudomonas aeruginosa NOT DETECTED NOT DETECTED Final   Stenotrophomonas maltophilia NOT DETECTED NOT DETECTED Final   Candida albicans NOT DETECTED NOT DETECTED Final   Candida auris NOT DETECTED NOT DETECTED Final   Candida glabrata NOT DETECTED NOT DETECTED Final   Candida krusei NOT DETECTED NOT DETECTED Final   Candida parapsilosis NOT DETECTED NOT DETECTED Final   Candida tropicalis NOT DETECTED NOT DETECTED Final   Cryptococcus neoformans/gattii NOT DETECTED NOT DETECTED Final   Methicillin resistance mecA/C DETECTED (A) NOT  DETECTED Final    Comment: CRITICAL RESULT CALLED TO, READ BACK BY AND VERIFIED WITH: SUSAN WATSON AT 1833 06/03/2020.PMF Performed at [facility], [address] [place], [place] [address]   SARS Coronavirus 2 by RT PCR (hospital order, performed in [facility] hospital lab) Nasopharyngeal Nasopharyngeal Swab     Status: None   Collection Time: 06/14/2020 12:01 PM   Specimen: Nasopharyngeal Swab  Result Value Ref Range Status   SARS Coronavirus 2 NEGATIVE NEGATIVE Final    Comment: (NOTE) SARS-CoV-2 target nucleic acids are NOT DETECTED.  The SARS-CoV-2 RNA is generally detectable in upper and lower respiratory specimens during the acute phase of infection. The lowest concentration of SARS-CoV-2 viral copies this assay can detect is 250 copies / mL. A negative result does not preclude SARS-CoV-2  infection and should not be used as the sole basis for treatment or other patient management decisions.  A negative result may occur with improper specimen collection / handling, submission of specimen other than nasopharyngeal swab, presence of viral mutation(s) within the areas targeted by this assay, and inadequate number of viral copies (<250 copies / mL). A negative result must be combined with clinical observations, patient history, and epidemiological information.  Fact Sheet for Patients:   StrictlyIdeas.no  Fact Sheet for Healthcare Providers: BankingDealers.co.za  This test is not yet approved or  cleared by the [place] FDA and has been authorized for detection and/or diagnosis of SARS-CoV-2 by FDA under an Emergency Use Authorization (EUA).  This EUA will remain in effect (meaning this test can be used) for the duration of the COVID-19 declaration under Section 564(b)(1) of the Act, 21 U.S.C. section 360bbb-3(b)(1), unless the authorization is terminated or revoked sooner.  Performed at [facility], [address] [place], [place] [address]     Coagulation Studies: No results for input(s): LABPROT, INR in the last 72 hours.  Urinalysis: No results for input(s): COLORURINE, LABSPEC, PHURINE, GLUCOSEU, HGBUR, BILIRUBINUR, KETONESUR, PROTEINUR, UROBILINOGEN, NITRITE, LEUKOCYTESUR in the last 72 hours.  Invalid input(s): APPERANCEUR    Imaging: DG Chest 2 View  Result Date: 06/28/2020 CLINICAL DATA:  Shortness of breath EXAM: CHEST - 2 VIEW COMPARISON:  01/24/2020, CT 05/20/2020 FINDINGS: Small moderate bilateral pleural effusions left greater than right. Basilar airspace consolidations. Cardiomegaly with vascular congestion. Aortic atherosclerosis. No pneumothorax. IMPRESSION: Cardiomegaly with vascular congestion, small to moderate bilateral pleural effusions left greater than right, and bibasilar  airspace consolidations which may reflect atelectasis or pneumonia Electronically Signed   By: Donavan Foil M.D.   On: 06/06/2020 01:54      Assessment & Plan: Ms. LARITA DEREMER is a 86 y.o.black  female with congestive heart failure, hypertension, atrial fibrillation, hyperlipidemia, history of lymphoma, who was admitted to [facility] on 06/10/2020 for Hypoxia [R09.02] Elevated TSH [R79.89] Acute CHF (congestive heart failure) (HCC) [I50.9] Acute on chronic congestive heart failure, unspecified heart failure type ([place]) [I50.9]  1. Acute renal failure on chronic kidney disease stage IV with proteinuria : baseline creatinine of 1.75, GFR of 26 on 05/08/20.  Acute renal failure secondary to acute cardiorenal syndrome.   2. Hypertension: 121/79. With acute exacerbation of diastolic congestive heart failure. Home regimen of torsemide 40mg  bid and metoprolol - IV furosemide 40mg  q12   3. Anemia with chronic kidney disease: microcytic.   4. Atrial fibrillation: rate controlled with metoprolol and anticoagulation with rivaroxaban  Plan - hold potassium  - fluid restriction - continue fursoemide 40mg  IV q12 - low salt  diet - daily weight.     LOS: 1 Breya Cass 9/18/202110:40 AM

## 2020-06-20 NOTE — Plan of Care (Signed)
°  Problem: Education: Goal: Knowledge of General Education information will improve Description: Including pain rating scale, medication(s)/side effects and non-pharmacologic comfort measures Outcome: Progressing   Problem: Health Behavior/Discharge Planning: Goal: Ability to manage health-related needs will improve Outcome: Progressing   Problem: Clinical Measurements: Goal: Ability to maintain clinical measurements within normal limits will improve Outcome: Progressing Goal: Will remain free from infection Outcome: Progressing Goal: Respiratory complications will improve Outcome: Progressing Goal: Cardiovascular complication will be avoided Outcome: Progressing   Problem: Coping: Goal: Level of anxiety will decrease Outcome: Progressing   Problem: Elimination: Goal: Will not experience complications related to urinary retention Outcome: Progressing   Problem: Pain Managment: Goal: General experience of comfort will improve Outcome: Progressing   Problem: Skin Integrity: Goal: Risk for impaired skin integrity will decrease Outcome: Progressing

## 2020-06-20 NOTE — Hospital Course (Addendum)
Ms. Billiot is an 86 yo AA female with PMH chronic respiratory failure (on 2L [place] O2), HFpEF, permanent afib (on Xarelto), HTN, CKDIV HLD, hx lymphoma, anemia of chronic disease who presented to the hospital with worsening SOB. She had been out of her medications at home for approx a couple weeks.  She was found to be volume overloaded and in afib with RVR. Nephrology and cardiology were consulted on admission.  A CXR was obtained which revealed bilateral pleural effusions and pulmonary edema.  She was started on IV Lasix and resumed back on her beta-blocker. She diuresed well and clinically responded to treatment with improved SOB and swelling.

## 2020-06-20 NOTE — Assessment & Plan Note (Signed)
-   well controlled - continue current regimen

## 2020-06-20 NOTE — ED Notes (Signed)
Transport to floor room 259.AS

## 2020-06-20 NOTE — Progress Notes (Signed)
   06/20/20 1151  Oxygen Therapy  SpO2 100 %  O2 Device Nasal Cannula  O2 Flow Rate (L/min) 2 L/min   MD would like to see how the patient does on 2L. Will recheck oxygen saturations.

## 2020-06-20 NOTE — Assessment & Plan Note (Addendum)
-  Cardiology following, appreciate assistance -Patient resumed on Lasix which has been further uptitrated per cardiology -Continue measuring strict I&O; diuresing well; likely needs a few more days of IV diuresis but overall responding well  -continue lasix 40 mg IV TID per cardiology

## 2020-06-20 NOTE — Progress Notes (Signed)
PROGRESS NOTE    QUINITA KOSTELECKY   DQQ:[id number]  DOB: [birth year]  DOA: 06/07/2020     1  PCP: Jodi Marble, MD  CC: SOB  Hospital Course: [name] is an 86 yo AA female with PMH chronic respiratory failure (on 2L [place] O2), HFpEF, permanent afib (on Xarelto), HTN, CKDIV HLD, hx lymphoma, anemia of chronic disease who presented to the hospital with worsening SOB. She had been out of her medications at home for approx a couple weeks.  She was found to be volume overloaded and in afib with RVR. Nephrology and cardiology were consulted on admission.  A CXR was obtained which revealed bilateral pleural effusions and pulmonary edema.  She was started on IV Lasix and resumed back on her beta-blocker.   Interval History:  Admitted overnight with SOB and swelling. She has been started on IV lasix and her BB and she is now rate controlled and more comfortable when seen this am.   Old records reviewed in assessment of this patient  ROS: Constitutional: negative for chills and fevers, Respiratory: positive for SOB, Cardiovascular: negative for chest pain and Gastrointestinal: negative for abdominal pain  Assessment & Plan: Acute on chronic heart failure with preserved ejection fraction (HFpEF) ([place]) -Cardiology following, appreciate assistance -Patient resumed on Lasix which has been further uptitrated per cardiology -Continue measuring strict I&O -So far, patient diuresing well  Atrial fibrillation with rapid ventricular response ([place]) -Considered due to being out of medications at home and volume overloaded -Continue Toprol and Xarelto.  Platelets have slightly down trended, will monitor for now; no indication to stop   GERD (gastroesophageal reflux disease) - continue PPI  HTN (hypertension) - well controlled - continue current regimen   Non compliance w medication regimen - will need to make sure patient can afford meds at discharge  CKD (chronic kidney disease),  stage IV ([place]) - nephrology following, appreciate assistance - baseline creatinine 1.75 per nephrology - etiology considered CRS in setting of volume overload - continue lasix, strict I&O   Antimicrobials: None  DVT prophylaxis: Xarelto Code Status: Full Family Communication: none present Disposition Plan: Status is: Inpatient  Remains inpatient appropriate because:Unsafe d/c plan, IV treatments appropriate due to intensity of illness or inability to take PO and Inpatient level of care appropriate due to severity of illness   Dispo: The patient is from: Home              Anticipated d/c is to: Home              Anticipated d/c date is: 3 days              Patient currently is not medically stable to d/c.       Objective: Blood pressure 103/75, pulse 81, temperature (!) 97.5 F (36.4 C), temperature source Oral, resp. rate 19, height 5\' 5"  (1.651 m), weight 101.9 kg, SpO2 99 %.  Examination: General appearance: alert, cooperative and no distress Head: Normocephalic, without obvious abnormality, atraumatic Eyes: EOMI Lungs: scattered crackles bilaterally Heart: irregularly irregular rhythm and S1, S2 normal Abdomen: slightly distended, soft, BS present Extremities: 3+ B/L LE pitting edema up to knees Skin: mobility and turgor normal Neurologic: Grossly normal  Consultants:   Nephrology  Cardiology  Procedures:   none  Data Reviewed: I have personally reviewed following labs and imaging studies Results for orders placed or performed during the hospital encounter of 06/25/2020 (from the past 24 hour(s))  Basic metabolic panel  Status: Abnormal   Collection Time: 06/20/20  6:36 AM  Result Value Ref Range   Sodium 140 135 - 145 mmol/L   Potassium 4.7 3.5 - 5.1 mmol/L   Chloride 110 98 - 111 mmol/L   CO2 22 22 - 32 mmol/L   Glucose, Bld 111 (H) 70 - 99 mg/dL   BUN 40 (H) 8 - 23 mg/dL   Creatinine, Ser 1.95 (H) 0.44 - 1.00 mg/dL   Calcium 8.6 (L) 8.9 - 10.3  mg/dL   GFR calc non Af Amer 22 (L) >60 mL/min   GFR calc Af Amer 26 (L) >60 mL/min   Anion gap 8 5 - 15  Brain natriuretic peptide     Status: Abnormal   Collection Time: 06/20/20  6:36 AM  Result Value Ref Range   B Natriuretic Peptide 672.1 (H) 0.0 - 100.0 pg/mL    Recent Results (from the past 240 hour(s))  Culture, blood (routine x 2)     Status: None (Preliminary result)   Collection Time: 06/11/2020  3:40 AM   Specimen: BLOOD  Result Value Ref Range Status   Specimen Description BLOOD LEFT AC  Final   Special Requests   Final    BOTTLES DRAWN AEROBIC AND ANAEROBIC Blood Culture adequate volume   Culture  Setup Time   Final    GRAM POSITIVE COCCI CRITICAL VALUE NOTED.  VALUE IS CONSISTENT WITH PREVIOUSLY REPORTED AND CALLED VALUE. Performed at [facility], [address] [place], [place] [address]    Culture [facility] POSITIVE COCCI  Final   Report Status PENDING  Incomplete  Culture, blood (routine x 2)     Status: None (Preliminary result)   Collection Time: 06/11/2020  3:40 AM   Specimen: BLOOD  Result Value Ref Range Status   Specimen Description BLOOD RIGHT FA  Final   Special Requests   Final    BOTTLES DRAWN AEROBIC AND ANAEROBIC Blood Culture adequate volume   Culture  Setup Time   Final    Organism ID to follow GRAM POSITIVE COCCI IN BOTH AEROBIC AND ANAEROBIC BOTTLES CRITICAL RESULT CALLED TO, READ BACK BY AND VERIFIED WITH: SUSAN WATSON AT 9563 06/12/2020.PMF Performed at [facility], [address] [place], [place] [address]    Culture [facility] POSITIVE COCCI  Final   Report Status PENDING  Incomplete  Blood Culture ID Panel (Reflexed)     Status: Abnormal   Collection Time: 06/22/2020  3:40 AM  Result Value Ref Range Status   Enterococcus faecalis NOT DETECTED NOT DETECTED Final   Enterococcus Faecium NOT DETECTED NOT DETECTED Final   Listeria monocytogenes NOT DETECTED NOT DETECTED Final   Staphylococcus species DETECTED (A) NOT DETECTED Final      Comment: CRITICAL RESULT CALLED TO, READ BACK BY AND VERIFIED WITH: SUSAN WATSON AT 3329 06/08/2020.PMF    Staphylococcus aureus (BCID) NOT DETECTED NOT DETECTED Final   Staphylococcus epidermidis DETECTED (A) NOT DETECTED Final    Comment: Methicillin (oxacillin) resistant coagulase negative staphylococcus. Possible blood culture contaminant (unless isolated from more than one blood culture draw or clinical case suggests pathogenicity). No antibiotic treatment is indicated for blood  culture contaminants. CRITICAL RESULT CALLED TO, READ BACK BY AND VERIFIED WITH: SUSAN WATSON AT 5188 06/04/2020.PMF    Staphylococcus lugdunensis NOT DETECTED NOT DETECTED Final   Streptococcus species NOT DETECTED NOT DETECTED Final   Streptococcus agalactiae NOT DETECTED NOT DETECTED Final   Streptococcus pneumoniae NOT DETECTED NOT DETECTED Final   Streptococcus pyogenes  NOT DETECTED NOT DETECTED Final   A.calcoaceticus-baumannii NOT DETECTED NOT DETECTED Final   Bacteroides fragilis NOT DETECTED NOT DETECTED Final   Enterobacterales NOT DETECTED NOT DETECTED Final   Enterobacter cloacae complex NOT DETECTED NOT DETECTED Final   Escherichia coli NOT DETECTED NOT DETECTED Final   Klebsiella aerogenes NOT DETECTED NOT DETECTED Final   Klebsiella oxytoca NOT DETECTED NOT DETECTED Final   Klebsiella pneumoniae NOT DETECTED NOT DETECTED Final   Proteus species NOT DETECTED NOT DETECTED Final   Salmonella species NOT DETECTED NOT DETECTED Final   Serratia marcescens NOT DETECTED NOT DETECTED Final   Haemophilus influenzae NOT DETECTED NOT DETECTED Final   Neisseria meningitidis NOT DETECTED NOT DETECTED Final   Pseudomonas aeruginosa NOT DETECTED NOT DETECTED Final   Stenotrophomonas maltophilia NOT DETECTED NOT DETECTED Final   Candida albicans NOT DETECTED NOT DETECTED Final   Candida auris NOT DETECTED NOT DETECTED Final   Candida glabrata NOT DETECTED NOT DETECTED Final   Candida krusei NOT DETECTED NOT  DETECTED Final   Candida parapsilosis NOT DETECTED NOT DETECTED Final   Candida tropicalis NOT DETECTED NOT DETECTED Final   Cryptococcus neoformans/gattii NOT DETECTED NOT DETECTED Final   Methicillin resistance mecA/C DETECTED (A) NOT DETECTED Final    Comment: CRITICAL RESULT CALLED TO, READ BACK BY AND VERIFIED WITH: SUSAN WATSON AT 1833 06/26/2020.PMF Performed at [facility], [address] [place], [place] [address]   SARS Coronavirus 2 by RT PCR (hospital order, performed in [facility] hospital lab) Nasopharyngeal Nasopharyngeal Swab     Status: None   Collection Time: 06/11/2020 12:01 PM   Specimen: Nasopharyngeal Swab  Result Value Ref Range Status   SARS Coronavirus 2 NEGATIVE NEGATIVE Final    Comment: (NOTE) SARS-CoV-2 target nucleic acids are NOT DETECTED.  The SARS-CoV-2 RNA is generally detectable in upper and lower respiratory specimens during the acute phase of infection. The lowest concentration of SARS-CoV-2 viral copies this assay can detect is 250 copies / mL. A negative result does not preclude SARS-CoV-2 infection and should not be used as the sole basis for treatment or other patient management decisions.  A negative result may occur with improper specimen collection / handling, submission of specimen other than nasopharyngeal swab, presence of viral mutation(s) within the areas targeted by this assay, and inadequate number of viral copies (<250 copies / mL). A negative result must be combined with clinical observations, patient history, and epidemiological information.  Fact Sheet for Patients:   StrictlyIdeas.no  Fact Sheet for Healthcare Providers: BankingDealers.co.za  This test is not yet approved or  cleared by the [place] FDA and has been authorized for detection and/or diagnosis of SARS-CoV-2 by FDA under an Emergency Use Authorization (EUA).  This EUA will remain in effect (meaning this  test can be used) for the duration of the COVID-19 declaration under Section 564(b)(1) of the Act, 21 U.S.C. section 360bbb-3(b)(1), unless the authorization is terminated or revoked sooner.  Performed at [facility], [address], [place], [place] [address]      Radiology Studies: DG Chest 2 View  Result Date: 06/25/2020 CLINICAL DATA:  Shortness of breath EXAM: CHEST - 2 VIEW COMPARISON:  01/24/2020, CT 05/20/2020 FINDINGS: Small moderate bilateral pleural effusions left greater than right. Basilar airspace consolidations. Cardiomegaly with vascular congestion. Aortic atherosclerosis. No pneumothorax. IMPRESSION: Cardiomegaly with vascular congestion, small to moderate bilateral pleural effusions left greater than right, and bibasilar airspace consolidations which may reflect atelectasis or pneumonia Electronically Signed  By: Donavan Foil M.D.   On: 07/01/2020 01:54   DG Chest 2 View  Final Result      Scheduled Meds: . allopurinol  50 mg Oral Daily  . atorvastatin  20 mg Oral Daily  . furosemide  40 mg Intravenous TID  . gabapentin  300 mg Oral TID  . Ibrutinib   Oral Daily  . [START ON 06/21/2020] influenza vaccine adjuvanted  0.5 mL Intramuscular Tomorrow-1000  . [START ON 06/21/2020] metoprolol succinate  100 mg Oral Daily  . [START ON 06/21/2020] pneumococcal 23 valent vaccine  0.5 mL Intramuscular Tomorrow-1000  . Rivaroxaban  15 mg Oral Q supper  . sodium chloride flush  3 mL Intravenous Q12H   PRN Meds: sodium chloride, acetaminophen, ipratropium-albuterol, ondansetron (ZOFRAN) IV, sodium chloride flush Continuous Infusions: . sodium chloride        LOS: 1 day  Time spent: Greater than 50% of the 35 minute visit was spent in counseling/coordination of care for the patient as laid out in the A&P.   Dwyane Dee, MD [facility] 06/20/2020, 3:53 PM  Contact via secure chat.  To contact the attending provider between 7A-7P or the covering  provider during after hours 7P-7A, please log into the web site [email or web address] and access using universal [place] password for that web site. If you do not have the password, please call the hospital operator.

## 2020-06-20 NOTE — Assessment & Plan Note (Addendum)
-  Considered due to being out of medications at home and volume overloaded -Continue Toprol and Xarelto.

## 2020-06-20 NOTE — Assessment & Plan Note (Signed)
-   will need to make sure patient can afford meds at discharge

## 2020-06-20 NOTE — Assessment & Plan Note (Signed)
continue PPI

## 2020-06-20 NOTE — Progress Notes (Signed)
Mobility Specialist - Progress Note   06/20/20 1351  Mobility  Activity Dangled on edge of bed;Stood at bedside (seated exercises at EOB, and marched in place at bedside)  Level of Assistance Moderate assist, patient does 50-74%  Assistive Device Front wheel walker  Mobility Response Tolerated well  Mobility performed by Mobility specialist  $Mobility charge 1 Mobility    Pre-mobility: 72 HR, 100% SpO2 During mobility: 97 HR, 100% SpO2 Post-mobility: 76 HR, 98% SpO2   Pt laying in bed w/ nurse present in room upon arrival. Pt currently utilizing 2L O2 [place]. Pt agreed to session. Pt transitioned from supine to sitting EOB w/ mod. Assist. Pt dangled EOB for a couple of minutes. Pt progressed to seated exercises: calf raises x 10, ankle pumps x 10, seated kicks x 10, seated marches x 10. Pt progressed to S2S using a RW, needing mod. Assist. Standing at bedside, pt marched in place 6x w/ CGA. Once seated on EOB, RW slipped off wall across from pt and slightly fell onto pt's R foot. Noted a minor mark on her R shin. Pt states she's "okay". Nurse was notified. Overall, pt tolerated session well. Pt's O2 sat > 97% throughout session while utilizing 2L O2 [place]. Pt left in bed w/ bed alarm set. All needs placed in reach.    Laquentin Loudermilk Mobility Specialist  06/20/20, 2:36 PM

## 2020-06-20 NOTE — Assessment & Plan Note (Addendum)
-   nephrology following, appreciate assistance - baseline creatinine 1.75 per nephrology - etiology considered CRS in setting of volume overload - continue lasix, strict I&O

## 2020-06-20 NOTE — Progress Notes (Signed)
Progress Note  Patient Name: Kristy Solomon Date of Encounter: 06/20/2020  Primary Cardiologist: Kate Sable, MD   Subjective   States today that she feels better but still feels she is holding onto volume.  Breathing has improved, though she is still on 5 L nasal cannula oxygen.  No chest pain, racing heart rate, or palpitations.  Treatment plan reviewed with her.  Inpatient Medications    Scheduled Meds: . allopurinol  50 mg Oral Daily  . atorvastatin  20 mg Oral Daily  . furosemide  40 mg Intravenous Q12H  . gabapentin  300 mg Oral TID  . Ibrutinib   Oral Daily  . [START ON 06/21/2020] influenza vaccine adjuvanted  0.5 mL Intramuscular Tomorrow-1000  . [START ON 06/21/2020] metoprolol succinate  100 mg Oral Daily  . [START ON 06/21/2020] pneumococcal 23 valent vaccine  0.5 mL Intramuscular Tomorrow-1000  . potassium chloride  10 mEq Oral BID  . Rivaroxaban  15 mg Oral Q supper  . sodium chloride flush  3 mL Intravenous Q12H   Continuous Infusions: . sodium chloride     PRN Meds: sodium chloride, acetaminophen, ipratropium-albuterol, ondansetron (ZOFRAN) IV, sodium chloride flush   Vital Signs    Vitals:   06/20/20 0100 06/20/20 0130 06/20/20 0304 06/20/20 0743  BP: 113/74 110/76 127/71 107/89  Pulse: 88 70 83 88  Resp: 18 18 20 18   Temp:   98 F (36.7 C) 97.7 F (36.5 C)  TempSrc:   Oral Oral  SpO2: 99% 99% 100% 100%  Weight:   101.9 kg   Height:        Intake/Output Summary (Last 24 hours) at 06/20/2020 1053 Last data filed at 06/20/2020 0600 Gross per 24 hour  Intake 246 ml  Output 1100 ml  Net -854 ml   Last 3 Weights 06/20/2020 06/30/2020 05/08/2020  Weight (lbs) 224 lb 11.2 oz 207 lb 198 lb 6.4 oz  Weight (kg) 101.923 kg 93.895 kg 89.994 kg      Telemetry    Afib, PVCs - Personally Reviewed  ECG    No new tracings- Personally Reviewed  Physical Exam   GEN: No acute distress.  Lying comfortably in bed. Neck: JVP ~11cm, though difficult  to assess due to body habitus Cardiac: IRIR, no murmurs, rubs, or gallops.  Respiratory:  Bibasilar crackles GI: Soft, nontender, non-distended  MS:  2+ bilateral lower extremity edema; No deformity. Neuro:  Nonfocal  Psych: Normal affect   Labs    High Sensitivity Troponin:   Recent Labs  Lab 06/05/2020 0117 06/30/2020 0340  TROPONINIHS 86* 83*      Chemistry Recent Labs  Lab 06/08/2020 0117 06/20/20 0636  NA 145 140  K 4.3 4.7  CL 112* 110  CO2 22 22  GLUCOSE 111* 111*  BUN 38* 40*  CREATININE 2.08* 1.95*  CALCIUM 8.5* 8.6*  GFRNONAA 21* 22*  GFRAA 24* 26*  ANIONGAP 11 8     Hematology Recent Labs  Lab 06/06/2020 0117  WBC 4.7  RBC 5.51*  HGB 12.4  HCT 41.7  MCV 75.7*  MCH 22.5*  MCHC 29.7*  RDW 19.9*  PLT 98*    BNP Recent Labs  Lab 06/20/20 0636  BNP 672.1*     DDimer No results for input(s): DDIMER in the last 168 hours.   Radiology    DG Chest 2 View  Result Date: 06/03/2020 CLINICAL DATA:  Shortness of breath EXAM: CHEST - 2 VIEW COMPARISON:  01/24/2020, CT 05/20/2020 FINDINGS:  Small moderate bilateral pleural effusions left greater than right. Basilar airspace consolidations. Cardiomegaly with vascular congestion. Aortic atherosclerosis. No pneumothorax. IMPRESSION: Cardiomegaly with vascular congestion, small to moderate bilateral pleural effusions left greater than right, and bibasilar airspace consolidations which may reflect atelectasis or pneumonia Electronically Signed   By: Donavan Foil M.D.   On: 06/05/2020 01:54    Cardiac Studies   Echo 12/31/2019 1. Left ventricular ejection fraction, by estimation, is 50 to 55%. The  left ventricle has low normal function. The left ventricle has no regional  wall motion abnormalities. There is mild left ventricular hypertrophy.  Left ventricular diastolic  parameters are indeterminate.  2. Right ventricular systolic function is normal. The right ventricular  size is normal. Tricuspid  regurgitation signal is inadequate for assessing  PA pressure.  3. Left atrial size was mildly dilated.  4. Right atrial size was mildly dilated.  5. The mitral valve is abnormal. Mild mitral valve regurgitation. No  evidence of mitral stenosis.  6. Tricuspid valve regurgitation is mild to moderate.  7. The aortic valve is tricuspid. Aortic valve regurgitation is not  visualized. No aortic stenosis is present.  8. The inferior vena cava is dilated in size with <50% respiratory  variability, suggesting right atrial pressure of 15 mmHg.   [place]  07/2019 Patient had a min HR of 36 bpm, max HR of 171 bpm, and avg HR of 67 bpm. Predominant underlying rhythm was Sinus Rhythm. First Degree AV Block was present. occasional Supraventricular Tachycardia runs occurred, the run with the fastest interval lasting 4 beats with a max rate of 171 bpm, Isolated SVEs were frequent (5.6%, [id number]), SVE Couplets were rare (<1.0%, 4699),  and SVE Triplets were rare (<1.0%, 508). Isolated VEs were frequent (20.3%, [id number]), VE Couplets were rare (<1.0%, 2113), and VE Triplets were rare (<1.0%, 986).    Patient Profile     86 y.o. female with history of permanent atrial fibrillation on Eliquis (CHA2DS2VASc score of at least  6), HFpEF, stage III-IV CKD, hypertension, hyperlipidemia, prior COVID-19 infection (01/2020), coronary and aortic atherosclerosis by CT, lymphoma, and who is being seen today for acute on chronic heart failure in the setting of medication noncompliance.  Assessment & Plan    Acute on Chronic diastolic CHF --Still volume up on exam and pt feels volume up with SOB on 5L [place].  Volume status likely exacerbated by RVR in the setting of medication noncompliance.  12/31/19 echo showed diastolic dysfunction / IVC dilated in size and elevated RA pressure.  Over the last 3 to 5 months, she has noted progressive HF s/sx in the setting of being out of her cardiac medications and O2 tank.   --Increase  IV lasix to 40mg  TID for more adequate urine output.  Goal output net -2 L daily as Cr allows until euvolemic on exam. Yesterday, net output was -854cc. With wt 93.9kg  101.9kg. Monitor I/Os and daily weights. Office dry weight 198 pounds per primary cardiologist. Daily BMET.  Cr 2.08  1.95 with BUN 38  40.  Baseline Cr ~1.7-1.8. --Adjustment of Toprol to 100mg  qd with PRN extra 1/2 tablet for elevated rates as outlined above to ensure compliance.  Transition to oral diuresis before discharge. KCl supplementation as needed with goal 4.0.   Persistent Atrial Fibrillation with RVR --Afib with RVR, initially diagnosed 10/2019.  --After discussion with primary cardiologist, plan to transition to Toprol 100 mg daily with an PRN 1/2 tablet daily if rates over 110.  Plan  for less frequent dosing of BB to ensure medication compliance. Rates currently well controlled. --Continue [place] with Xarelto 15mg  daily due to CrCl below 50. CHA2DS2VASc score of at least 6 (CHF, HTN, age x2, vascular, female). Reports poor tolerance of Eliquis 2/2 stomach discomfort.   HTN --Continue current medications with BB and diuresis.   HLD --Continue statin. LDL 42.   CKDIII-IV --Nephrology following.  Elevated HS Tn --No CP.  Elevated HS Tn likely in the setting of the above atrial fibrillation and acute on chronic diastolic heart failure.  Suspect supply demand ischemia; however, she has never undergone ischemic evaluation.  EKG without acute ST/T changes.  Could consider ischemic evaluation as an outpatient; however, with advanced CKD 3/4, she is poor candidate for LHC.  Continue current BB and statin.  Xarelto in lieu of ASA.   For questions or updates, please contact [place] Please consult [email or web address] for contact info under        Signed, Arvil Chaco, PA-C  06/20/2020, 10:53 AM

## 2020-06-20 NOTE — Plan of Care (Signed)

## 2020-06-20 NOTE — Plan of Care (Signed)
  Problem: Clinical Measurements: Goal: Respiratory complications will improve Outcome: Progressing   Problem: Safety: Goal: Ability to remain free from injury will improve Outcome: Progressing   

## 2020-06-21 DIAGNOSIS — D696 Thrombocytopenia, unspecified: Secondary | ICD-10-CM

## 2020-06-21 DIAGNOSIS — I10 Essential (primary) hypertension: Secondary | ICD-10-CM

## 2020-06-21 DIAGNOSIS — R799 Abnormal finding of blood chemistry, unspecified: Secondary | ICD-10-CM

## 2020-06-21 LAB — BASIC METABOLIC PANEL
Anion gap: 7 (ref 5–15)
BUN: 43 mg/dL — ABNORMAL HIGH (ref 8–23)
CO2: 25 mmol/L (ref 22–32)
Calcium: 8.2 mg/dL — ABNORMAL LOW (ref 8.9–10.3)
Chloride: 109 mmol/L (ref 98–111)
Creatinine, Ser: 2.05 mg/dL — ABNORMAL HIGH (ref 0.44–1.00)
GFR calc Af Amer: 24 mL/min — ABNORMAL LOW (ref >60)
GFR calc non Af Amer: 21 mL/min — ABNORMAL LOW (ref >60)
Glucose, Bld: 124 mg/dL — ABNORMAL HIGH (ref 70–99)
Potassium: 4.1 mmol/L (ref 3.5–5.1)
Sodium: 141 mmol/L (ref 135–145)

## 2020-06-21 LAB — CBC WITH DIFFERENTIAL/PLATELET
Abs Immature Granulocytes: 0.02 10*3/uL (ref 0.00–0.07)
Basophils Absolute: 0.1 10*3/uL (ref 0.0–0.1)
Basophils Relative: 1 %
Eosinophils Absolute: 0.1 10*3/uL (ref 0.0–0.5)
Eosinophils Relative: 2 %
HCT: 37 % (ref 36.0–46.0)
Hemoglobin: 11.3 g/dL — ABNORMAL LOW (ref 12.0–15.0)
Immature Granulocytes: 0 %
Lymphocytes Relative: 23 %
Lymphs Abs: 1.2 10*3/uL (ref 0.7–4.0)
MCH: 22.7 pg — ABNORMAL LOW (ref 26.0–34.0)
MCHC: 30.5 g/dL (ref 30.0–36.0)
MCV: 74.4 fL — ABNORMAL LOW (ref 80.0–100.0)
Monocytes Absolute: 0.7 10*3/uL (ref 0.1–1.0)
Monocytes Relative: 13 %
Neutro Abs: 3 10*3/uL (ref 1.7–7.7)
Neutrophils Relative %: 61 %
Platelets: 86 10*3/uL — ABNORMAL LOW (ref 150–400)
RBC: 4.97 MIL/uL (ref 3.87–5.11)
RDW: 19.4 % — ABNORMAL HIGH (ref 11.5–15.5)
WBC: 5 10*3/uL (ref 4.0–10.5)
nRBC: 0 % (ref 0.0–0.2)

## 2020-06-21 LAB — IRON AND TIBC
Iron: 24 ug/dL — ABNORMAL LOW (ref 28–170)
Saturation Ratios: 8 % — ABNORMAL LOW (ref 10.4–31.8)
TIBC: 298 ug/dL (ref 250–450)
UIBC: 274 ug/dL

## 2020-06-21 LAB — FERRITIN: Ferritin: 19 ng/mL (ref 11–307)

## 2020-06-21 LAB — PROCALCITONIN: Procalcitonin: <0.1 ng/mL

## 2020-06-21 LAB — MAGNESIUM: Magnesium: 2 mg/dL (ref 1.7–2.4)

## 2020-06-21 MED ORDER — FUROSEMIDE 10 MG/ML IJ SOLN
40.0000 mg | Freq: Three times a day (TID) | INTRAMUSCULAR | Status: DC
  Administered 2020-06-21 – 2020-06-22 (×5): 40 mg via INTRAVENOUS
  Filled 2020-06-21 (×5): qty 4

## 2020-06-21 MED ORDER — FUROSEMIDE 10 MG/ML IJ SOLN: 40.0000 mg | Freq: Two times a day (BID) | INTRAMUSCULAR | Status: DC

## 2020-06-21 NOTE — Progress Notes (Signed)
Progress Note  Patient Name: Kristy Solomon Date of Encounter: 06/21/2020  [facility] Cardiologist: Kate Sable, MD   Subjective   Feels tired, wants to get some sleep.  Breathing is better, swelling is improved.  -800 cc  Inpatient Medications    Scheduled Meds: . allopurinol  50 mg Oral Daily  . atorvastatin  20 mg Oral Daily  . furosemide  40 mg Intravenous BID  . gabapentin  300 mg Oral TID  . Ibrutinib   Oral Daily  . metoprolol succinate  100 mg Oral Daily  . Rivaroxaban  15 mg Oral Q supper  . sodium chloride flush  3 mL Intravenous Q12H   Continuous Infusions: . sodium chloride     PRN Meds: sodium chloride, acetaminophen, ipratropium-albuterol, ondansetron (ZOFRAN) IV, sodium chloride flush   Vital Signs    Vitals:   06/20/20 2219 06/21/20 0427 06/21/20 0757 06/21/20 1132  BP: 111/67 113/69 108/69 98/60  Pulse:  (!) 55 74 95  Resp:   17 18  Temp:  98.4 F (36.9 C) (!) 97.5 F (36.4 C) 98 F (36.7 C)  TempSrc:  Oral Oral Oral  SpO2:  100% 98% 91%  Weight:  102.4 kg    Height:        Intake/Output Summary (Last 24 hours) at 06/21/2020 1246 Last data filed at 06/21/2020 1129 Gross per 24 hour  Intake 1510 ml  Output 3725 ml  Net -2215 ml   Last 3 Weights 06/21/2020 06/20/2020 07/01/2020  Weight (lbs) 225 lb 11.2 oz 224 lb 11.2 oz 207 lb  Weight (kg) 102.377 kg 101.923 kg 93.895 kg      Telemetry    Atrial fibrillation- Personally Reviewed  ECG    No new tracing- Personally Reviewed  Physical Exam   GEN: No acute distress.   Neck: No JVD Cardiac:  Irregular irregular Respiratory:  Decreased breath sounds GI: Soft, nontender, distended  MS: 2+ edema; No deformity. Neuro:  Nonfocal  Psych: Normal affect   Labs    High Sensitivity Troponin:   Recent Labs  Lab 06/10/2020 0117 06/17/2020 0340  TROPONINIHS 86* 83*      Chemistry Recent Labs  Lab 06/21/2020 0117 06/20/20 0636 06/21/20 0405  NA 145 140 141  K 4.3 4.7 4.1    CL 112* 110 109  CO2 22 22 25   GLUCOSE 111* 111* 124*  BUN 38* 40* 43*  CREATININE 2.08* 1.95* 2.05*  CALCIUM 8.5* 8.6* 8.2*  GFRNONAA 21* 22* 21*  GFRAA 24* 26* 24*  ANIONGAP 11 8 7      Hematology Recent Labs  Lab 06/07/2020 0117 06/21/20 0405  WBC 4.7 5.0  RBC 5.51* 4.97  HGB 12.4 11.3*  HCT 41.7 37.0  MCV 75.7* 74.4*  MCH 22.5* 22.7*  MCHC 29.7* 30.5  RDW 19.9* 19.4*  PLT 98* 86*    BNP Recent Labs  Lab 06/20/20 0636  BNP 672.1*     DDimer No results for input(s): DDIMER in the last 168 hours.   Radiology    No results found.  Cardiac Studies   Echo 12/2019 1. Left ventricular ejection fraction, by estimation, is 50 to 55%. The  left ventricle has low normal function. The left ventricle has no regional  wall motion abnormalities. There is mild left ventricular hypertrophy.  Left ventricular diastolic  parameters are indeterminate.  2. Right ventricular systolic function is normal. The right ventricular  size is normal. Tricuspid regurgitation signal is inadequate for assessing  PA pressure.  3. Left atrial size was mildly dilated.  4. Right atrial size was mildly dilated.  5. The mitral valve is abnormal. Mild mitral valve regurgitation. No  evidence of mitral stenosis.  6. Tricuspid valve regurgitation is mild to moderate.  7. The aortic valve is tricuspid. Aortic valve regurgitation is not  visualized. No aortic stenosis is present.  8. The inferior vena cava is dilated in size with <50% respiratory  variability, suggesting right atrial pressure of 15 mmHg.  Patient Profile     86 y.o. female with history of permanent atrial fibrillation, HFpEF, CKD, hypertension lymphoma who presents due to shortness of breath and volume overload in the setting of medication noncompliance.  Assessment & Plan    1.HFpEF -Net -815 cc over the past 24 hours, creatinine stable -cont IV Lasix to 40 mg 3 times daily -Clinically improving -Monitor weight  closely, monitor creatinine -Weight at last office visit was 198 pounds.  2.  Permanent atrial fibrillation -Heart rate controlled -CHA2DS2-VASc of 5 (chf, htn, age,gender) -Toprol-XL 100 mg daily -Renally dose Xarelto 15 mg daily  3.  Hypertension -BP controlled -Toprol-XL, diuretics  4. CKD -Nephrology following -Monitor creatinine with diuresing  Greater than 50% was spent in counseling and coordination of care with patient Total encounter time 35 minutes      Signed, Kate Sable, MD  06/21/2020, 12:46 PM

## 2020-06-21 NOTE — Assessment & Plan Note (Signed)
-   follows with oncology; last OV 04/23/20 - continue ibrutinib

## 2020-06-21 NOTE — Assessment & Plan Note (Signed)
-   patient has mild chronic thrombocytopenia at baseline ~120-140k - etiology due to her underlying Waldenstrom's macroglobulinemia and effect from ibrutinib; follows with oncology - okay to continue xarelto for now

## 2020-06-21 NOTE — Assessment & Plan Note (Addendum)
-   admission blood cultures from 9/17 growing multiple Staph species in 3/4 bottles which is now much more consistent with contamination (initial culture had only MRSE). No objective evidence of infection (afebrile, no leukocytosis, negative PCT, normal lactic) and clinically she is non-toxic appearing and admitted for volume overload/CHF exac - repeated blood cultures on 9/18, follow these up: remaining negative - monitor off abx for now; remains afebrile with no leukocytosis and non-toxic appearing

## 2020-06-21 NOTE — Progress Notes (Signed)
Pt was admitted on the floor with no signs of distress. Pt was alert x4. VSS. Pt was educated about heart failure through the heart failure hand out. Pt was educated about safety and ascom within pt reach. Will continue to monitor.

## 2020-06-21 NOTE — Progress Notes (Signed)
PROGRESS NOTE    Kristy Solomon   TKZ:[id number]  DOB: [birth year]  DOA: 06/14/2020     2  PCP: Jodi Marble, MD  CC: SOB  Hospital Course: Kristy Solomon is an 86 yo AA female with PMH chronic respiratory failure (on 2L  O2), HFpEF, permanent afib (on Xarelto), HTN, CKDIV HLD, hx lymphoma, anemia of chronic disease who presented to the hospital with worsening SOB. She had been out of her medications at home for approx a couple weeks.  She was found to be volume overloaded and in afib with RVR. Nephrology and cardiology were consulted on admission.  A CXR was obtained which revealed bilateral pleural effusions and pulmonary edema.  She was started on IV Lasix and resumed back on her beta-blocker. She diuresed well and clinically responded to treatment with improved SOB and swelling.    Interval History:  Admitted with SOB and swelling.  This morning she is overall feeling better in terms of her breathing and swelling.  She was brushing her teeth in bed and states that she can tell she has voided fairly well since yesterday.  Old records reviewed in assessment of this patient  ROS: Constitutional: negative for chills and fevers, Respiratory: positive for SOB, Cardiovascular: negative for chest pain and Gastrointestinal: negative for abdominal pain  Assessment & Plan: Acute on chronic heart failure with preserved ejection fraction (HFpEF) (Kristy Solomon) -Cardiology following, appreciate assistance -Patient resumed on Lasix which has been further uptitrated per cardiology -Continue measuring strict I&O; diuresing well -continue lasix 40 mg IV TID per cardiology   Atrial fibrillation with rapid ventricular response ([place]) -Considered due to being out of medications at home and volume overloaded -Continue Toprol and Xarelto.   GERD (gastroesophageal reflux disease) - continue PPI  HTN (hypertension) - well controlled - continue current regimen   Non compliance w medication  regimen - will need to make sure patient can afford meds at discharge  CKD (chronic kidney disease), stage IV [facility]) - nephrology following, appreciate assistance - baseline creatinine 1.75 per nephrology - etiology considered CRS in setting of volume overload - continue lasix, strict I&O  Contamination of blood culture - admission blood cultures from 9/17 growing MRSE in 3/4 bottles. No objective evidence of infection (afebrile, no leukocytosis, negative PCT, normal lactic) and clinically she is non-toxic appearing and admitted for volume overload/CHF exac - repeated blood cultures on 9/18, follow these up - monitor off abx for now; remains afebrile with no leukocytosis and non-toxic appearing  Thrombocytopenia (HCC) - patient has mild chronic thrombocytopenia at baseline ~120-140k - etiology due to her underlying Kristy's macroglobulinemia and effect from ibrutinib; follows with oncology - okay to continue xarelto for now   Kristy macroglobulinemia [facility]) - follows with oncology; last OV 04/23/20 - continue ibrutinib    Antimicrobials: None  DVT prophylaxis: Xarelto Code Status: Full Family Communication: none present Disposition Plan: Status is: Inpatient  Remains inpatient appropriate because:Unsafe d/c plan, IV treatments appropriate due to intensity of illness or inability to take PO and Inpatient level of care appropriate due to severity of illness   Dispo: The patient is from: [facility]              Anticipated d/c is to: [facility] d/c date is: 3 days              Patient currently is not medically stable to d/c.  Objective:  Blood pressure 98/60, pulse 95, temperature 98 F (36.7 C), temperature source Oral, resp. rate 18, height 5\' 5"  (1.651 m), weight 102.4 kg, SpO2 91 %.  Examination: General appearance: alert, cooperative and no distress Head: Normocephalic, without obvious abnormality, atraumatic Eyes: EOMI Lungs:  scattered crackles bilaterally Heart: irregularly irregular rhythm and S1, S2 normal Abdomen: slightly distended, soft, BS present Extremities: 3+ B/L LE pitting edema up to knees Skin: mobility and turgor normal Neurologic: Grossly normal  Consultants:   Nephrology  Cardiology  Procedures:   none  Data Reviewed: I have personally reviewed following labs and imaging studies Results for orders placed or performed during the hospital encounter of 07/02/2020 (from the past 24 hour(s))  CULTURE, BLOOD (ROUTINE X 2) w Reflex to ID Panel     Status: None (Preliminary result)   Collection Time: 06/20/20  5:34 PM   Specimen: BLOOD  Result Value Ref Range   Specimen Description BLOOD [facility]    Special Requests      BOTTLES DRAWN AEROBIC AND ANAEROBIC Blood Culture adequate volume   Culture      NO GROWTH < 12 HOURS Performed at [facility], [address] [place], [place] [address]    Report Status PENDING   CULTURE, BLOOD (ROUTINE X 2) w Reflex to ID Panel     Status: None (Preliminary result)   Collection Time: 06/20/20  5:34 PM   Specimen: BLOOD  Result Value Ref Range   Specimen Description BLOOD RAC    Special Requests      BOTTLES DRAWN AEROBIC AND ANAEROBIC Blood Culture adequate volume   Culture      NO GROWTH < 12 HOURS Performed at [facility], [address] [place], [place] [address]    Report Status PENDING   Basic metabolic panel     Status: Abnormal   Collection Time: 06/21/20  4:05 AM  Result Value Ref Range   Sodium 141 135 - 145 mmol/L   Potassium 4.1 3.5 - 5.1 mmol/L   Chloride 109 98 - 111 mmol/L   CO2 25 22 - 32 mmol/L   Glucose, Bld 124 (H) 70 - 99 mg/dL   BUN 43 (H) 8 - 23 mg/dL   Creatinine, Ser 2.05 (H) 0.44 - 1.00 mg/dL   Calcium 8.2 (L) 8.9 - 10.3 mg/dL   GFR calc non Af Amer 21 (L) >60 mL/min   GFR calc Af Amer 24 (L) >60 mL/min   Anion gap 7 5 - 15  CBC with Differential/Platelet     Status: Abnormal   Collection Time:  06/21/20  4:05 AM  Result Value Ref Range   WBC 5.0 4.0 - 10.5 K/uL   RBC 4.97 3.87 - 5.11 MIL/uL   Hemoglobin 11.3 (L) 12.0 - 15.0 g/dL   HCT 37.0 36 - 46 %   MCV 74.4 (L) 80.0 - 100.0 fL   MCH 22.7 (L) 26.0 - 34.0 pg   MCHC 30.5 30.0 - 36.0 g/dL   RDW 19.4 (H) 11.5 - 15.5 %   Platelets 86 (L) 150 - 400 K/uL   nRBC 0.0 0.0 - 0.2 %   Neutrophils Relative % 61 %   Neutro Abs 3.0 1.7 - 7.7 K/uL   Lymphocytes Relative 23 %   Lymphs Abs 1.2 0.7 - 4.0 K/uL   Monocytes Relative 13 %   Monocytes Absolute 0.7 0 - 1 K/uL   Eosinophils Relative 2 %   Eosinophils Absolute 0.1 0 - 0 K/uL  Basophils Relative 1 %   Basophils Absolute 0.1 0 - 0 K/uL   Immature Granulocytes 0 %   Abs Immature Granulocytes 0.02 0.00 - 0.07 K/uL  Magnesium     Status: None   Collection Time: 06/21/20  4:05 AM  Result Value Ref Range   Magnesium 2.0 1.7 - 2.4 mg/dL  Procalcitonin     Status: None   Collection Time: 06/21/20  4:05 AM  Result Value Ref Range   Procalcitonin <0.10 ng/mL  Ferritin     Status: None   Collection Time: 06/21/20  4:05 AM  Result Value Ref Range   Ferritin 19 11 - 307 ng/mL  Iron and TIBC     Status: Abnormal   Collection Time: 06/21/20  4:05 AM  Result Value Ref Range   Iron 24 (L) 28 - 170 ug/dL   TIBC 298 250 - 450 ug/dL   Saturation Ratios 8 (L) 10.4 - 31.8 %   UIBC 274 ug/dL    Recent Results (from the past 240 hour(s))  Culture, blood (routine x 2)     Status: Abnormal (Preliminary result)   Collection Time: 06/29/2020  3:40 AM   Specimen: BLOOD  Result Value Ref Range Status   Specimen Description   Final    BLOOD LEFT AC Performed at [facility], [address] [place], [place] [address]    Special Requests   Final    BOTTLES DRAWN AEROBIC AND ANAEROBIC Blood Culture adequate volume Performed at [facility], [address] [place], [place] [address]    Culture  Setup Time   Final    GRAM POSITIVE COCCI AEROBIC BOTTLE ONLY CRITICAL VALUE  NOTED.  VALUE IS CONSISTENT WITH PREVIOUSLY REPORTED AND CALLED VALUE. Performed at [facility], [address], [place], [place] [address]    Culture (A)  Final    STAPHYLOCOCCUS CAPITIS STAPHYLOCOCCUS EPIDERMIDIS    Report Status PENDING  Incomplete  Culture, blood (routine x 2)     Status: Abnormal (Preliminary result)   Collection Time: 06/24/2020  3:40 AM   Specimen: BLOOD  Result Value Ref Range Status   Specimen Description   Final    BLOOD RIGHT FA Performed at [facility], [address] [place], [place] [address]    Special Requests   Final    BOTTLES DRAWN AEROBIC AND ANAEROBIC Blood Culture adequate volume Performed at [facility], [address] [place], [place] [address]    Culture  Setup Time   Final    Organism ID to follow GRAM POSITIVE COCCI IN BOTH AEROBIC AND ANAEROBIC BOTTLES CRITICAL RESULT CALLED TO, READ BACK BY AND VERIFIED WITH: SUSAN WATSON AT 1833 06/27/2020.PMF Performed at [facility], [address] [place],  [address]    Culture (A)  Final    STAPHYLOCOCCUS HOMINIS STAPHYLOCOCCUS EPIDERMIDIS    Report Status PENDING  Incomplete  Blood Culture ID Panel (Reflexed)     Status: Abnormal   Collection Time: 06/20/2020  3:40 AM  Result Value Ref Range Status   Enterococcus faecalis NOT DETECTED NOT DETECTED Final   Enterococcus Faecium NOT DETECTED NOT DETECTED Final   Listeria monocytogenes NOT DETECTED NOT DETECTED Final   Staphylococcus species DETECTED (A) NOT DETECTED Final    Comment: CRITICAL RESULT CALLED TO, READ BACK BY AND VERIFIED WITH: SUSAN WATSON AT 1833 06/29/2020.PMF    Staphylococcus aureus (BCID) NOT DETECTED NOT DETECTED Final   Staphylococcus epidermidis DETECTED (A) NOT DETECTED Final  Comment: Methicillin (oxacillin) resistant coagulase negative staphylococcus. Possible blood culture contaminant (unless isolated from more than one blood culture draw or clinical case suggests  pathogenicity). No antibiotic treatment is indicated for blood  culture contaminants. CRITICAL RESULT CALLED TO, READ BACK BY AND VERIFIED WITH: SUSAN WATSON AT 3149 06/30/2020.PMF    Staphylococcus lugdunensis NOT DETECTED NOT DETECTED Final   Streptococcus species NOT DETECTED NOT DETECTED Final   Streptococcus agalactiae NOT DETECTED NOT DETECTED Final   Streptococcus pneumoniae NOT DETECTED NOT DETECTED Final   Streptococcus pyogenes NOT DETECTED NOT DETECTED Final   A.calcoaceticus-baumannii NOT DETECTED NOT DETECTED Final   Bacteroides fragilis NOT DETECTED NOT DETECTED Final   Enterobacterales NOT DETECTED NOT DETECTED Final   Enterobacter cloacae complex NOT DETECTED NOT DETECTED Final   Escherichia coli NOT DETECTED NOT DETECTED Final   Klebsiella aerogenes NOT DETECTED NOT DETECTED Final   Klebsiella oxytoca NOT DETECTED NOT DETECTED Final   Klebsiella pneumoniae NOT DETECTED NOT DETECTED Final   Proteus species NOT DETECTED NOT DETECTED Final   Salmonella species NOT DETECTED NOT DETECTED Final   Serratia marcescens NOT DETECTED NOT DETECTED Final   Haemophilus influenzae NOT DETECTED NOT DETECTED Final   Neisseria meningitidis NOT DETECTED NOT DETECTED Final   Pseudomonas aeruginosa NOT DETECTED NOT DETECTED Final   Stenotrophomonas maltophilia NOT DETECTED NOT DETECTED Final   Candida albicans NOT DETECTED NOT DETECTED Final   Candida auris NOT DETECTED NOT DETECTED Final   Candida glabrata NOT DETECTED NOT DETECTED Final   Candida krusei NOT DETECTED NOT DETECTED Final   Candida parapsilosis NOT DETECTED NOT DETECTED Final   Candida tropicalis NOT DETECTED NOT DETECTED Final   Cryptococcus neoformans/gattii NOT DETECTED NOT DETECTED Final   Methicillin resistance mecA/C DETECTED (A) NOT DETECTED Final    Comment: CRITICAL RESULT CALLED TO, READ BACK BY AND VERIFIED WITH: SUSAN WATSON AT 1833 06/17/2020.PMF Performed at [facility], [address]  [place], [place] [address]   SARS Coronavirus 2 by RT PCR (hospital order, performed in [facility] hospital lab) Nasopharyngeal Nasopharyngeal Swab     Status: None   Collection Time: 06/28/2020 12:01 PM   Specimen: Nasopharyngeal Swab  Result Value Ref Range Status   SARS Coronavirus 2 NEGATIVE NEGATIVE Final    Comment: (NOTE) SARS-CoV-2 target nucleic acids are NOT DETECTED.  The SARS-CoV-2 RNA is generally detectable in upper and lower respiratory specimens during the acute phase of infection. The lowest concentration of SARS-CoV-2 viral copies this assay can detect is 250 copies / mL. A negative result does not preclude SARS-CoV-2 infection and should not be used as the sole basis for treatment or other patient management decisions.  A negative result may occur with improper specimen collection / handling, submission of specimen other than nasopharyngeal swab, presence of viral mutation(s) within the areas targeted by this assay, and inadequate number of viral copies (<250 copies / mL). A negative result must be combined with clinical observations, patient history, and epidemiological information.  Fact Sheet for Patients:   StrictlyIdeas.no  Fact Sheet for Healthcare Providers: BankingDealers.co.za  This test is not yet approved or  cleared by the [place] FDA and has been authorized for detection and/or diagnosis of SARS-CoV-2 by FDA under an Emergency Use Authorization (EUA).  This EUA will remain in effect (meaning this test can be used) for the duration of the COVID-19 declaration under Section 564(b)(1) of the Act, 21 U.S.C. section 360bbb-3(b)(1), unless the authorization is terminated or revoked sooner.  Performed  at [facility], [address] [place], [place] [address]   CULTURE, BLOOD (ROUTINE X 2) w Reflex to ID Panel     Status: None (Preliminary result)   Collection Time: 06/20/20  5:34 PM   Specimen:  BLOOD  Result Value Ref Range Status   Specimen Description BLOOD [facility]  Final   Special Requests   Final    BOTTLES DRAWN AEROBIC AND ANAEROBIC Blood Culture adequate volume   Culture   Final    NO GROWTH < 12 HOURS Performed at [facility], [address], [place], [place] [address]    Report Status PENDING  Incomplete  CULTURE, BLOOD (ROUTINE X 2) w Reflex to ID Panel     Status: None (Preliminary result)   Collection Time: 06/20/20  5:34 PM   Specimen: BLOOD  Result Value Ref Range Status   Specimen Description BLOOD RAC  Final   Special Requests   Final    BOTTLES DRAWN AEROBIC AND ANAEROBIC Blood Culture adequate volume   Culture   Final    NO GROWTH < 12 HOURS Performed at [facility], [address], [place], [place] [address]    Report Status PENDING  Incomplete     Radiology Studies: No results found. DG Chest 2 View  Final Result      Scheduled Meds: . allopurinol  50 mg Oral Daily  . atorvastatin  20 mg Oral Daily  . furosemide  40 mg Intravenous TID  . gabapentin  300 mg Oral TID  . Ibrutinib   Oral Daily  . metoprolol succinate  100 mg Oral Daily  . Rivaroxaban  15 mg Oral Q supper  . sodium chloride flush  3 mL Intravenous Q12H   PRN Meds: sodium chloride, acetaminophen, ipratropium-albuterol, ondansetron (ZOFRAN) IV, sodium chloride flush Continuous Infusions: . sodium chloride        LOS: 2 days  Time spent: Greater than 50% of the 35 minute visit was spent in counseling/coordination of care for the patient as laid out in the A&P.   Dwyane Dee, MD [facility] 06/21/2020, 2:19 PM  Contact via secure chat.  To contact the attending provider between 7A-7P or the covering provider during after hours 7P-7A, please log into the web site [email or web address] and access using universal [place] password for that web site. If you do not have the password, please call the hospital operator.

## 2020-06-21 NOTE — TOC Initial Note (Signed)
Transition of Care [facility]) - Initial/Assessment Note    Patient Details  Name: Kristy Solomon MRN: [id number] Date of Birth: [birth year]  Transition of [facility]) CM/SW Contact:    Meriel Flavors, LCSW Phone Number: 06/21/2020, 9:08 AM  Clinical Narrative:                 Patient is a current resident at [place] [facility] facility where she receives medication and is provided transportation to appointments as needed. Patient has daughter, [name] active in supporting patient care. Patient was admitted to [facility] in April due to Covid 19 and feels her health has been declining since then.     Barriers to Discharge: Continued Medical Work up   Patient Goals and CMS Choice  To feel better again      Expected Discharge Plan and Services  Likely to return facility                                              Prior Living Arrangements/Services                       Activities of Daily Living Home Assistive Devices/Equipment: Gilford Rile (specify type) ADL Screening (condition at time of admission) Patient's cognitive ability adequate to safely complete daily activities?: Yes Is the patient deaf or have difficulty hearing?: No Does the patient have difficulty seeing, even when wearing glasses/contacts?: No Does the patient have difficulty concentrating, remembering, or making decisions?: No Patient able to express need for assistance with ADLs?: Yes Does the patient have difficulty dressing or bathing?: Yes Independently performs ADLs?: No Communication: Independent Dressing (OT): Needs assistance Is this a change from baseline?: Pre-admission baseline Grooming: Independent Feeding: Independent Bathing: Needs assistance Is this a change from baseline?: Pre-admission baseline Toileting: Independent In/Out Bed: Independent Walks in Home: Independent with device (comment) Does the patient have difficulty walking or climbing stairs?: Yes Weakness of Legs:  Both Weakness of Arms/Hands: Both  Permission Sought/Granted                  Emotional Assessment              Admission diagnosis:  Hypoxia [R09.02] Elevated TSH [R79.89] Acute CHF (congestive heart failure) ([place]) [I50.9] Acute on chronic congestive heart failure, unspecified heart failure type ([place]) [I50.9] Patient Active Problem List   Diagnosis Date Noted  . Contamination of blood culture 06/21/2020  . Permanent atrial fibrillation ([place])   . Acute CHF (congestive heart failure) ([place]) 06/09/2020  . Non compliance w medication regimen 06/07/2020  . Abnormal CXR 05/01/2020  . Palliative care by specialist   . Atrial fibrillation with rapid ventricular response ([place])   . Pneumonia due to COVID-19 virus 01/24/2020  . Renal mass, left 01/13/2020  . Persistent atrial fibrillation ([place])   . Chronic kidney disease, stage 3b   . Acute on chronic heart failure with preserved ejection fraction (HFpEF) ([place]) 01/06/2020  . Acute decompensated heart failure ([place]) 01/03/2020  . Acute on chronic diastolic CHF (congestive heart failure) ([place]) 12/31/2019  . CKD (chronic kidney disease), stage IV ([place]) 12/31/2019  . Atrial fibrillation, chronic ([place]) 12/31/2019  . Elevated troponin 12/31/2019  . Renal insufficiency 07/28/2019  . Bilateral edema of lower extremity 05/07/2019  . HLD (hyperlipidemia) 09/18/2018  . HTN (hypertension) 09/18/2018  . GERD (gastroesophageal reflux disease) 09/18/2018  .  Back pain 09/18/2018  . Bradycardia 09/18/2018  . Bigeminy 09/18/2018  . Nephrolithiasis 01/11/2018  . Goals of care, counseling/discussion 12/28/2017  . Gastroenteritis 12/28/2017  . B12 deficiency 03/18/2016  . Folate deficiency 03/18/2016  . Iron deficiency anemia 03/18/2016  . Pneumonia 03/17/2016  . Pressure ulcer 03/17/2016  . Thrombocytopenia ([place]) 07/04/2014  . Acute kidney injury superimposed on chronic kidney disease ([place]) 07/04/2014  . Hyperuricemia 07/04/2014  .  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 Waldenstrom macroglobulinemia ([place]) 12/27/2013  . Calculi, ureter 07/03/2012  . Frank hematuria 07/03/2012  . Hydronephrosis 07/03/2012  . 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 Calculus of kidney 07/02/2012  . Nonspecific finding on examination of urine 07/02/2012   PCP:  Jodi Marble, MD Pharmacy:   [facility] [place], [place] - [place] AT [facility] [address] [place] [address] Phone: [phone] Fax: [phone]  [place], [place] [address] Phone: [phone] Fax: [phone]     Social Determinants of Health (SDOH) Interventions    Readmission Risk Interventions Readmission Risk Prevention Plan 06/21/2020  Transportation Screening Complete  PCP or Specialist Appt within 3-5 Days Complete  HRI or [place] Complete  Social Work Consult for [place] Planning/Counseling Complete  Palliative Care Screening Complete  Medication Review Press photographer) Complete  Some recent data might be hidden

## 2020-06-21 NOTE — Plan of Care (Signed)
  Problem: Clinical Measurements: Goal: Respiratory complications will improve Outcome: Progressing   Problem: Safety: Goal: Ability to remain free from injury will improve Outcome: Progressing   

## 2020-06-21 NOTE — Progress Notes (Signed)
[facility]  ROUNDING NOTE   Subjective:   UOP 2648mL. Furosemide 40mg  iV tid yesterday.   Patient states her breathing is better and her edema has improved.   Objective:  Vital signs in last 24 hours:  Temp:  [97.5 F (36.4 C)-98.4 F (36.9 C)] 97.5 F (36.4 C) (09/19 0757) Pulse Rate:  [55-89] 74 (09/19 0757) Resp:  [17-19] 17 (09/19 0757) BP: (98-121)/(62-79) 108/69 (09/19 0757) SpO2:  [98 %-100 %] 98 % (09/19 0757) Weight:  [102.4 kg] 102.4 kg (09/19 0427)  Weight change: 0.454 kg Filed Weights   06/27/2020 0113 06/20/20 0304 06/21/20 0427  Weight: 93.9 kg 101.9 kg 102.4 kg    Intake/Output: I/O last 3 completed shifts: In: 2056 [P.O.:1690; I.V.:6; Other:360] Out: 3025 [VOJJK:0938]   Intake/Output this shift:  Total I/O In: -  Out: 1000 [Urine:1000]  Physical Exam: General: NAD, sitting up in bed  Head: Normocephalic, atraumatic. Moist oral mucosal membranes  Eyes: Anicteric, PERRL  Neck: Supple, trachea midline  Lungs:  Bilateral basilar crackles, 2L [place] O2  Heart: Regular rate and rhythm  Abdomen:  Soft, nontender,   Extremities:  ++ peripheral edema.  Neurologic: Nonfocal, moving all four extremities  Skin: No lesions        Basic Metabolic Panel: Recent Labs  Lab 06/29/2020 0117 06/20/20 0636 06/21/20 0405  NA 145 140 141  K 4.3 4.7 4.1  CL 112* 110 109  CO2 22 22 25   GLUCOSE 111* 111* 124*  BUN 38* 40* 43*  CREATININE 2.08* 1.95* 2.05*  CALCIUM 8.5* 8.6* 8.2*  MG  --   --  2.0    Liver Function Tests: No results for input(s): AST, ALT, ALKPHOS, BILITOT, PROT, ALBUMIN in the last 168 hours. No results for input(s): LIPASE, AMYLASE in the last 168 hours. No results for input(s): AMMONIA in the last 168 hours.  CBC: Recent Labs  Lab 06/04/2020 0117 06/21/20 0405  WBC 4.7 5.0  NEUTROABS  --  3.0  HGB 12.4 11.3*  HCT 41.7 37.0  MCV 75.7* 74.4*  PLT 98* 86*    Cardiac Enzymes: No results for input(s): CKTOTAL, CKMB,  CKMBINDEX, TROPONINI in the last 168 hours.  BNP: Invalid input(s): POCBNP  CBG: No results for input(s): GLUCAP in the last 168 hours.  Microbiology: Results for orders placed or performed during the hospital encounter of 06/17/2020  Culture, blood (routine x 2)     Status: None (Preliminary result)   Collection Time: 06/17/2020  3:40 AM   Specimen: BLOOD  Result Value Ref Range Status   Specimen Description BLOOD LEFT AC  Final   Special Requests   Final    BOTTLES DRAWN AEROBIC AND ANAEROBIC Blood Culture adequate volume   Culture  Setup Time   Final    GRAM POSITIVE COCCI AEROBIC BOTTLE ONLY CRITICAL VALUE NOTED.  VALUE IS CONSISTENT WITH PREVIOUSLY REPORTED AND CALLED VALUE. Performed at [facility], [address] [place], [place] [address]    Culture [facility] POSITIVE COCCI  Final   Report Status PENDING  Incomplete  Culture, blood (routine x 2)     Status: None (Preliminary result)   Collection Time: 06/18/2020  3:40 AM   Specimen: BLOOD  Result Value Ref Range Status   Specimen Description BLOOD RIGHT FA  Final   Special Requests   Final    BOTTLES DRAWN AEROBIC AND ANAEROBIC Blood Culture adequate volume   Culture  Setup Time   Final    Organism ID  to follow [place] AND ANAEROBIC BOTTLES CRITICAL RESULT CALLED TO, READ BACK BY AND VERIFIED WITH: SUSAN WATSON AT 7591 06/20/2020.PMF Performed at [facility], [address] [place], [place] [address]    Culture [facility] POSITIVE COCCI  Final   Report Status PENDING  Incomplete  Blood Culture ID Panel (Reflexed)     Status: Abnormal   Collection Time: 06/26/2020  3:40 AM  Result Value Ref Range Status   Enterococcus faecalis NOT DETECTED NOT DETECTED Final   Enterococcus Faecium NOT DETECTED NOT DETECTED Final   Listeria monocytogenes NOT DETECTED NOT DETECTED Final   Staphylococcus species DETECTED (A) NOT DETECTED Final    Comment: CRITICAL RESULT CALLED TO, READ BACK BY AND  VERIFIED WITH: SUSAN WATSON AT 6599 06/30/2020.PMF    Staphylococcus aureus (BCID) NOT DETECTED NOT DETECTED Final   Staphylococcus epidermidis DETECTED (A) NOT DETECTED Final    Comment: Methicillin (oxacillin) resistant coagulase negative staphylococcus. Possible blood culture contaminant (unless isolated from more than one blood culture draw or clinical case suggests pathogenicity). No antibiotic treatment is indicated for blood  culture contaminants. CRITICAL RESULT CALLED TO, READ BACK BY AND VERIFIED WITH: SUSAN WATSON AT 3570 06/26/2020.PMF    Staphylococcus lugdunensis NOT DETECTED NOT DETECTED Final   Streptococcus species NOT DETECTED NOT DETECTED Final   Streptococcus agalactiae NOT DETECTED NOT DETECTED Final   Streptococcus pneumoniae NOT DETECTED NOT DETECTED Final   Streptococcus pyogenes NOT DETECTED NOT DETECTED Final   A.calcoaceticus-baumannii NOT DETECTED NOT DETECTED Final   Bacteroides fragilis NOT DETECTED NOT DETECTED Final   Enterobacterales NOT DETECTED NOT DETECTED Final   Enterobacter cloacae complex NOT DETECTED NOT DETECTED Final   Escherichia coli NOT DETECTED NOT DETECTED Final   Klebsiella aerogenes NOT DETECTED NOT DETECTED Final   Klebsiella oxytoca NOT DETECTED NOT DETECTED Final   Klebsiella pneumoniae NOT DETECTED NOT DETECTED Final   Proteus species NOT DETECTED NOT DETECTED Final   Salmonella species NOT DETECTED NOT DETECTED Final   Serratia marcescens NOT DETECTED NOT DETECTED Final   Haemophilus influenzae NOT DETECTED NOT DETECTED Final   Neisseria meningitidis NOT DETECTED NOT DETECTED Final   Pseudomonas aeruginosa NOT DETECTED NOT DETECTED Final   Stenotrophomonas maltophilia NOT DETECTED NOT DETECTED Final   Candida albicans NOT DETECTED NOT DETECTED Final   Candida auris NOT DETECTED NOT DETECTED Final   Candida glabrata NOT DETECTED NOT DETECTED Final   Candida krusei NOT DETECTED NOT DETECTED Final   Candida parapsilosis NOT DETECTED NOT  DETECTED Final   Candida tropicalis NOT DETECTED NOT DETECTED Final   Cryptococcus neoformans/gattii NOT DETECTED NOT DETECTED Final   Methicillin resistance mecA/C DETECTED (A) NOT DETECTED Final    Comment: CRITICAL RESULT CALLED TO, READ BACK BY AND VERIFIED WITH: SUSAN WATSON AT 1833 06/28/2020.PMF Performed at [facility], [address] [place], [place] [address]   SARS Coronavirus 2 by RT PCR (hospital order, performed in [facility] hospital lab) Nasopharyngeal Nasopharyngeal Swab     Status: None   Collection Time: 06/04/2020 12:01 PM   Specimen: Nasopharyngeal Swab  Result Value Ref Range Status   SARS Coronavirus 2 NEGATIVE NEGATIVE Final    Comment: (NOTE) SARS-CoV-2 target nucleic acids are NOT DETECTED.  The SARS-CoV-2 RNA is generally detectable in upper and lower respiratory specimens during the acute phase of infection. The lowest concentration of SARS-CoV-2 viral copies this assay can detect is 250 copies / mL. A negative result does not preclude SARS-CoV-2 infection  and should not be used as the sole basis for treatment or other patient management decisions.  A negative result may occur with improper specimen collection / handling, submission of specimen other than nasopharyngeal swab, presence of viral mutation(s) within the areas targeted by this assay, and inadequate number of viral copies (<250 copies / mL). A negative result must be combined with clinical observations, patient history, and epidemiological information.  Fact Sheet for Patients:   StrictlyIdeas.no  Fact Sheet for Healthcare Providers: BankingDealers.co.za  This test is not yet approved or  cleared by the [place] FDA and has been authorized for detection and/or diagnosis of SARS-CoV-2 by FDA under an Emergency Use Authorization (EUA).  This EUA will remain in effect (meaning this test can be used) for the duration of the COVID-19  declaration under Section 564(b)(1) of the Act, 21 U.S.C. section 360bbb-3(b)(1), unless the authorization is terminated or revoked sooner.  Performed at [facility], [address] [place], [place] [address]   CULTURE, BLOOD (ROUTINE X 2) w Reflex to ID Panel     Status: None (Preliminary result)   Collection Time: 06/20/20  5:34 PM   Specimen: BLOOD  Result Value Ref Range Status   Specimen Description BLOOD BR[facility]  Final   Special Requests   Final    BOTTLES DRAWN AEROBIC AND ANAEROBIC Blood Culture adequate volume   Culture   Final    NO GROWTH < 12 HOURS Performed at [facility], [address], [place], [place] [address]    Report Status PENDING  Incomplete  CULTURE, BLOOD (ROUTINE X 2) w Reflex to ID Panel     Status: None (Preliminary result)   Collection Time: 06/20/20  5:34 PM   Specimen: BLOOD  Result Value Ref Range Status   Specimen Description BLOOD RAC  Final   Special Requests   Final    BOTTLES DRAWN AEROBIC AND ANAEROBIC Blood Culture adequate volume   Culture   Final    NO GROWTH < 12 HOURS Performed at [facility], [address] [place], [place] [address]    Report Status PENDING  Incomplete    Coagulation Studies: No results for input(s): LABPROT, INR in the last 72 hours.  Urinalysis: No results for input(s): COLORURINE, LABSPEC, PHURINE, GLUCOSEU, HGBUR, BILIRUBINUR, KETONESUR, PROTEINUR, UROBILINOGEN, NITRITE, LEUKOCYTESUR in the last 72 hours.  Invalid input(s): APPERANCEUR    Imaging: No results found.   Medications:   . sodium chloride     . allopurinol  50 mg Oral Daily  . atorvastatin  20 mg Oral Daily  . furosemide  40 mg Intravenous BID  . gabapentin  300 mg Oral TID  . Ibrutinib   Oral Daily  . influenza vaccine adjuvanted  0.5 mL Intramuscular [place]-1000  . metoprolol succinate  100 mg Oral Daily  . Rivaroxaban  15 mg Oral Q supper  . sodium chloride flush  3 mL Intravenous Q12H   sodium  chloride, acetaminophen, ipratropium-albuterol, ondansetron (ZOFRAN) IV, sodium chloride flush  Assessment/ Plan:  Ms. NATHA GUIN is a 86 y.o. black female with congestive heart failure, hypertension, atrial fibrillation, hyperlipidemia, history of lymphoma, who was admitted to [facility] on 06/14/2020 for Hypoxia [R09.02] Elevated TSH [R79.89] Acute CHF (congestive heart failure) (HCC) [I50.9] Acute on chronic congestive heart failure, unspecified heart failure type ([place]) [I50.9]  1. Acute renal failure on chronic kidney disease stage IV with proteinuria : baseline creatinine of 1.75, GFR of 26 on 05/08/20.  Acute renal  failure secondary to acute cardiorenal syndrome.   2. Hypertension: With acute exacerbation of diastolic congestive heart failure. Home regimen of torsemide 40mg  bid and metoprolol - IV furosemide 40mg     3. Anemia with chronic kidney disease: microcytic.   4. Atrial fibrillation: rate controlled with metoprolol and anticoagulation with rivaroxaban   LOS: 2 [place] 9/19/202110:29 AM

## 2020-06-22 LAB — CBC WITH DIFFERENTIAL/PLATELET
Abs Immature Granulocytes: 0.02 10*3/uL (ref 0.00–0.07)
Basophils Absolute: 0 10*3/uL (ref 0.0–0.1)
Basophils Relative: 1 %
Eosinophils Absolute: 0 10*3/uL (ref 0.0–0.5)
Eosinophils Relative: 1 %
HCT: 39.8 % (ref 36.0–46.0)
Hemoglobin: 11.8 g/dL — ABNORMAL LOW (ref 12.0–15.0)
Immature Granulocytes: 0 %
Lymphocytes Relative: 22 %
Lymphs Abs: 1.3 10*3/uL (ref 0.7–4.0)
MCH: 22.4 pg — ABNORMAL LOW (ref 26.0–34.0)
MCHC: 29.6 g/dL — ABNORMAL LOW (ref 30.0–36.0)
MCV: 75.7 fL — ABNORMAL LOW (ref 80.0–100.0)
Monocytes Absolute: 0.6 10*3/uL (ref 0.1–1.0)
Monocytes Relative: 10 %
Neutro Abs: 3.8 10*3/uL (ref 1.7–7.7)
Neutrophils Relative %: 66 %
Platelets: 97 10*3/uL — ABNORMAL LOW (ref 150–400)
RBC: 5.26 MIL/uL — ABNORMAL HIGH (ref 3.87–5.11)
RDW: 19.6 % — ABNORMAL HIGH (ref 11.5–15.5)
WBC: 5.8 10*3/uL (ref 4.0–10.5)
nRBC: 0 % (ref 0.0–0.2)

## 2020-06-22 LAB — MAGNESIUM: Magnesium: 2.1 mg/dL (ref 1.7–2.4)

## 2020-06-22 LAB — BASIC METABOLIC PANEL
Anion gap: 8 (ref 5–15)
BUN: 43 mg/dL — ABNORMAL HIGH (ref 8–23)
CO2: 26 mmol/L (ref 22–32)
Calcium: 8.3 mg/dL — ABNORMAL LOW (ref 8.9–10.3)
Chloride: 109 mmol/L (ref 98–111)
Creatinine, Ser: 2 mg/dL — ABNORMAL HIGH (ref 0.44–1.00)
GFR calc Af Amer: 25 mL/min — ABNORMAL LOW (ref >60)
GFR calc non Af Amer: 22 mL/min — ABNORMAL LOW (ref >60)
Glucose, Bld: 135 mg/dL — ABNORMAL HIGH (ref 70–99)
Potassium: 3.8 mmol/L (ref 3.5–5.1)
Sodium: 143 mmol/L (ref 135–145)

## 2020-06-22 LAB — PROCALCITONIN: Procalcitonin: <0.1 ng/mL

## 2020-06-22 MED ORDER — SENNOSIDES-DOCUSATE SODIUM 8.6-50 MG PO TABS
1.0000 | ORAL_TABLET | Freq: Two times a day (BID) | ORAL | Status: DC
  Administered 2020-06-22 – 2020-07-07 (×29): 1 via ORAL
  Filled 2020-06-22 (×29): qty 1

## 2020-06-22 MED ORDER — POLYETHYLENE GLYCOL 3350 17 G PO PACK
17.0000 g | PACK | Freq: Every day | ORAL | Status: DC
  Administered 2020-06-22 – 2020-07-07 (×12): 17 g via ORAL
  Filled 2020-06-22 (×13): qty 1

## 2020-06-22 MED ORDER — FERROUS SULFATE 325 (65 FE) MG PO TABS
325.0000 mg | ORAL_TABLET | Freq: Every day | ORAL | Status: DC
  Administered 2020-06-22 – 2020-07-07 (×15): 325 mg via ORAL
  Filled 2020-06-22 (×16): qty 1

## 2020-06-22 NOTE — Evaluation (Signed)
Occupational Therapy Evaluation Patient Details Name: Kristy Solomon MRN: [id number] DOB: 06-26-1929 Today's Date: 06/22/2020    History of Present Illness Kristy Solomon is an 86 y/o female who was admitted for worsening SOB with an increase in O2 to 3L due to symptoms. In ED, pt was tachycardic and in rapid A-fib w/ HR to 170. PMH includes chronic respiratory failure on 2L O2 continuous, chrinic diastolic dysfunction, A-fib on chronic anticoagulation therapy, HTN with complications of CKD III-IV, covid-19 virus infection April 2021, and history of malignant lymphoma.   Clinical Impression   Pt was seen for OT evaluation this date. Prior to hospital admission, pt was MOD I with fxl mobility with walking stick and requires MAX A with some LB ADLs including bathing/dressing and reports that her daughter assists her with these in addition to IADLs such as driving/getting groceries. Pt lives alone in [place] [facility] with 4 STE and L railing. Currently pt demonstrates impairments as described below (See OT problem list) which functionally limit her ability to perform ADL/self-care tasks. Pt currently requires MIN A with ADL transfers, has to sit to perform grooming and other UB ADLs d/t decreased fxl activity tolerance, and requires MAX A for LB ADLs (which appears to potentially be baseline for LB).  Pt would benefit from skilled OT services to address noted impairments and functional limitations (see below for any additional details) in order to maximize safety and independence while minimizing falls risk and caregiver burden. Upon hospital discharge, recommend HHOT and intermittent SUPV (at least initially upon d/c) to maximize pt safety and return to functional independence during meaningful occupations of daily life.     Follow Up Recommendations  Home health OT;Supervision - Intermittent    Equipment Recommendations  3 in 1 bedside commode    Recommendations for Other Services       Precautions /  Restrictions Precautions Precautions: Fall Restrictions Weight Bearing Restrictions: No      Mobility Bed Mobility Overal bed mobility: Needs Assistance Bed Mobility: Supine to Sit     Supine to sit: Mod assist;HOB elevated     General bed mobility comments: Pt up to chair pre/post session  Transfers Overall transfer level: Needs assistance Equipment used: Rolling walker (2 wheeled) Transfers: Sit to/from Stand Sit to Stand: Min assist         General transfer comment: MIN verbal cues for safe hand placement with RW    Balance Overall balance assessment: Needs assistance Sitting-balance support: Feet supported;Bilateral upper extremity supported Sitting balance-Leahy Scale: Good Sitting balance - Comments: G sitting balance at edge of chair when prepping to stand   Standing balance support: Bilateral upper extremity supported;During functional activity Standing balance-Leahy Scale: Poor Standing balance comment: requires UE support and CGA for static standing (F), MIN A for dynamic (P)                           ADL either performed or assessed with clinical judgement   ADL Overall ADL's : Needs assistance/impaired                                       General ADL Comments: setup for seated UB ADLs (reports she can usually perform grooming standing at baseline) and MAX A for seated LB ADLs-limited LE ROM     Vision Baseline Vision/History: Wears glasses Patient Visual Report: No change from  baseline       Perception     Praxis      Pertinent Vitals/Pain Pain Assessment: No/denies pain     Hand Dominance Right   Extremity/Trunk Assessment Upper Extremity Assessment Upper Extremity Assessment: Overall WFL for tasks assessed;Generalized weakness (ROM WFL, MMT grossly 3+ to 4-/5 bilaterally)   Lower Extremity Assessment Lower Extremity Assessment: Defer to PT evaluation;Generalized weakness       Communication  Communication Communication: No difficulties   Cognition Arousal/Alertness: Awake/alert Behavior During Therapy: WFL for tasks assessed/performed;Flat affect Overall Cognitive Status: Within Functional Limits for tasks assessed                                 General Comments: somewhat drowsy throughout session, but overall oriented and appropriate. Sometimes requires increased time to process.   General Comments       Exercises Exercises: Other exercises Other Exercises Other Exercises: OT facilitates ed re: role of OT in acute setting, safety considerations, use of call light. Pt with good understanding. Other Exercises: OT engages pt in seated grooming including oral hygiene with SETUP and MIN verbal cues to sequence. Pt performing in sitting and reports that she can usually tolerate performing these tasks in standing at sink at baseline.   Shoulder Instructions      Home Living Family/patient expects to be discharged to:: Private residence Living Arrangements: Alone Available Help at Discharge: Family;Available PRN/intermittently Type of Home: House Home Access: Stairs to enter [employer] of Steps: 4 Entrance Stairs-Rails: Left Home Layout: One level     Bathroom Shower/Tub: Teacher, early years/pre: Standard     Home Equipment: Other (comment);Shower seat;Grab bars - tub/shower;Walker - 2 wheels ("shower stool")          Prior Functioning/Environment Level of Independence: Needs assistance  Gait / Transfers Assistance Needed: Pt reports indep with amb and transfers with walking stick. Denies fall hx in 6 months and reports primarily performing fxl mobility within the home. ADL's / Homemaking Assistance Needed: Pt reports that she requires assist with bathing/dressing from daughter.   Comments: Pt reports that she does not drive and her daughter takes her to appts/gets groceries.        OT Problem List: Decreased  strength;Decreased activity tolerance;Impaired balance (sitting and/or standing);Decreased knowledge of use of DME or AE;Cardiopulmonary status limiting activity      OT Treatment/Interventions: Self-care/ADL training;DME and/or AE instruction;Therapeutic activities;Balance training;Therapeutic exercise;Energy conservation;Patient/family education    OT Goals(Current goals can be found in the care plan section) Acute Rehab OT Goals Patient Stated Goal: none stated OT Goal Formulation: With patient Time For Goal Achievement: 07/12/2020 Potential to Achieve Goals: Good ADL Goals Pt Will Transfer to Toilet: with min guard assist;ambulating;grab bars (LRAD to restroom) Pt Will Perform Toileting - Clothing Manipulation and hygiene: with supervision;sit to/from stand Pt/caregiver will Perform Home Exercise Program: Increased strength;Both right and left upper extremity;With minimal assist Additional ADL Goal #1: Pt will verbalize EC strategies x3 with no verbal cues.  OT Frequency: Min 1X/week   Barriers to D/C:            Co-evaluation              AM-PAC OT "6 Clicks" Daily Activity     Outcome Measure Help from another person eating meals?: None Help from another person taking care of personal grooming?: A Little Help from another person toileting, which includes  using toliet, bedpan, or urinal?: A Lot Help from another person bathing (including washing, rinsing, drying)?: A Lot Help from another person to put on and taking off regular upper body clothing?: A Little Help from another person to put on and taking off regular lower body clothing?: A Lot 6 Click Score: 16   End of Session Equipment Utilized During Treatment: Gait belt;Rolling walker;Oxygen Nurse Communication: Mobility status  Activity Tolerance: Patient tolerated treatment well Patient left: in chair;with call bell/phone within reach;with chair alarm set  OT Visit Diagnosis: Unsteadiness on feet (R26.81);Muscle  weakness (generalized) (M62.81)                Time: 1222-4114 OT Time Calculation (min): 25 min Charges:  OT General Charges $OT Visit: 1 Visit OT Evaluation $OT Eval Moderate Complexity: 1 Mod OT Treatments $Self Care/Home Management : 8-22 mins  Gerrianne Scale, MS, OTR/L ascom [phone] 06/22/20, 5:33 PM

## 2020-06-22 NOTE — Progress Notes (Addendum)
  Heart Failure Nurse Navigator Note  Initial visit   HFpEF 50-55%  Comorbidities: Permanent A fib CKD stage 4 Hypertension Medical non compliance Anemia Hyperlipidemia     Medications: Lasix 40 mg IV TID Metoprolol succinate 100 mg daily Lipitor 40 mg  Xarelto  15 mg with supper  Labs: Sodium 143, potassium 3.8, BUN 43, creatinine 2.0, magnesium 2.1 hemoglobin 11.8.    Weights:103.2  Kg,  101.9 on admission.   BMI 37.8    Intake-0 Output-1450 ml  Echocardiogram- Ef- 50 -55%, Mild LVH, Mild bi atrial enlargement, Mild MR, Mild to moderate TR.   Exam:  She is awake and alert, sitting up in bed,states that she is sleepy even though  she had slept well last night.   HEENT-pupils equal and reactive to light. Cardiac- heart tones irregular Respiratory-breath sounds with scattered crackles through out the lung fields to posterior auscultation.  GI- stomach is rounded and soft, bowel sounds present in all four quads.  Skin- dry and intact.  Extremities- legs and feet with 2+ edema.  Neuro- speech is clear, moves all extremties without problems.  Psych- is pleasant and appropriate.  Discussed with patient her diet.  She states at home she can walk about 20 feet before she is SOB.  When questioned why she did not refill the torsemide she states she just did feel like it.  She has teaching booklet and magnet at bedside.   Pricilla Riffle RN, CHFN

## 2020-06-22 NOTE — Plan of Care (Signed)
  Problem: Clinical Measurements: Goal: Respiratory complications will improve Outcome: Progressing Goal: Cardiovascular complication will be avoided Outcome: Progressing   Problem: Pain Managment: Goal: General experience of comfort will improve Outcome: Progressing   Problem: Safety: Goal: Ability to remain free from injury will improve Outcome: Progressing   

## 2020-06-22 NOTE — Care Management Important Message (Signed)
Important Message  Patient Details  Name: Kristy Solomon MRN: [id number] Date of Birth: Jul 27, 1929   Medicare Important Message Given:  Yes     [name] 06/22/2020, 12:17 PM

## 2020-06-22 NOTE — Progress Notes (Signed)
  Heart Failure Nurse Navigator Note  Initial visit   HFpEF 50-55%  Comorbidities: Permanent A fib CKD stage 4 Hypertension Lasix 40 mg IVTID Metoprolol succinate 100 mg daily

## 2020-06-22 NOTE — Progress Notes (Signed)
PROGRESS NOTE    Kristy Solomon   YFV:[id number]  DOB: 1929-06-13  DOA: 06/05/2020     3  PCP: Kristy Marble, MD  CC: SOB  Hospital Course: Kristy Solomon is an 86 yo AA female with PMH chronic respiratory failure (on 2L [place] O2), HFpEF, permanent afib (on Xarelto), HTN, CKDIV HLD, hx lymphoma, anemia of chronic disease who presented to the hospital with worsening SOB. She had been out of her medications at home for approx a couple weeks.  She was found to be volume overloaded and in afib with RVR. Nephrology and cardiology were consulted on admission.  A CXR was obtained which revealed bilateral pleural effusions and pulmonary edema.  She was started on IV Lasix and resumed back on her beta-blocker. She diuresed well and clinically responded to treatment with improved SOB and swelling.    Interval History:  Admitted with SOB and swelling.  Feeling much better since admission.  Breathing is comfortable and can tell a difference in her legs with swelling.  Understands plan is for ongoing IV diuresis in the hospital.  Old records reviewed in assessment of this patient  ROS: Constitutional: negative for chills and fevers, Respiratory: positive for SOB, Cardiovascular: negative for chest pain and Gastrointestinal: negative for abdominal pain  Assessment & Plan: Acute on chronic heart failure with preserved ejection fraction (HFpEF) ([place]) -Cardiology following, appreciate assistance -Patient resumed on Lasix which has been further uptitrated per cardiology -Continue measuring strict I&O; diuresing well; likely needs a few more days of IV diuresis but overall responding well  -continue lasix 40 mg IV TID per cardiology   Atrial fibrillation with rapid ventricular response ([place]) -Considered due to being out of medications at home and volume overloaded -Continue Toprol and Xarelto.   GERD (gastroesophageal reflux disease) - continue PPI  HTN (hypertension) - well controlled -  continue current regimen   Non compliance w medication regimen - will need to make sure patient can afford meds at discharge  CKD (chronic kidney disease), stage IV [facility]) - nephrology following, appreciate assistance - baseline creatinine 1.75 per nephrology - etiology considered CRS in setting of volume overload - continue lasix, strict I&O  Contamination of blood culture - admission blood cultures from 9/17 growing multiple Staph species in 3/4 bottles which is now much more consistent with contamination (initial culture had only MRSE). No objective evidence of infection (afebrile, no leukocytosis, negative PCT, normal lactic) and clinically she is non-toxic appearing and admitted for volume overload/CHF exac - repeated blood cultures on 9/18, follow these up: remaining negative - monitor off abx for now; remains afebrile with no leukocytosis and non-toxic appearing  Thrombocytopenia (HCC) - patient has mild chronic thrombocytopenia at baseline ~120-140k - etiology due to her underlying Waldenstrom's macroglobulinemia and effect from ibrutinib; follows with oncology - okay to continue xarelto for now   Waldenstrom macroglobulinemia [facility]) - follows with oncology; last OV 04/23/20 - continue ibrutinib    Antimicrobials: None  DVT prophylaxis: Xarelto Code Status: Full Family Communication: none present Disposition Plan: Status is: Inpatient  Remains inpatient appropriate because:Unsafe d/c plan, IV treatments appropriate due to intensity of illness or inability to take PO and Inpatient level of care appropriate due to severity of illness   Dispo: The patient is from: [facility]              Anticipated d/c is to: [facility] d/c date  is: 3 days              Patient currently is not medically stable to d/c.  Objective: Blood pressure 114/80, pulse 83, temperature 98.2 F (36.8 C), temperature source Oral, resp. rate 17, height 5\' 5"  (1.651 m),  weight 103.2 kg, SpO2 98 %.  Examination: General appearance: alert, cooperative and no distress Head: Normocephalic, without obvious abnormality, atraumatic Eyes: EOMI Lungs: scattered crackles bilaterally Heart: irregularly irregular rhythm and S1, S2 normal Abdomen: slightly distended, soft, BS present Extremities: 3+ B/L LE pitting edema up to knees, starting to show improvement Skin: mobility and turgor normal Neurologic: Grossly normal  Consultants:   Nephrology  Cardiology  Procedures:   none  Data Reviewed: I have personally reviewed following labs and imaging studies Results for orders placed or performed during the hospital encounter of 06/28/2020 (from the past 24 hour(s))  Basic metabolic panel     Status: Abnormal   Collection Time: 06/22/20  6:07 AM  Result Value Ref Range   Sodium 143 135 - 145 mmol/L   Potassium 3.8 3.5 - 5.1 mmol/L   Chloride 109 98 - 111 mmol/L   CO2 26 22 - 32 mmol/L   Glucose, Bld 135 (H) 70 - 99 mg/dL   BUN 43 (H) 8 - 23 mg/dL   Creatinine, Ser 2.00 (H) 0.44 - 1.00 mg/dL   Calcium 8.3 (L) 8.9 - 10.3 mg/dL   GFR calc non Af Amer 22 (L) >60 mL/min   GFR calc Af Amer 25 (L) >60 mL/min   Anion gap 8 5 - 15  CBC with Differential/Platelet     Status: Abnormal   Collection Time: 06/22/20  6:07 AM  Result Value Ref Range   WBC 5.8 4.0 - 10.5 K/uL   RBC 5.26 (H) 3.87 - 5.11 MIL/uL   Hemoglobin 11.8 (L) 12.0 - 15.0 g/dL   HCT 39.8 36 - 46 %   MCV 75.7 (L) 80.0 - 100.0 fL   MCH 22.4 (L) 26.0 - 34.0 pg   MCHC 29.6 (L) 30.0 - 36.0 g/dL   RDW 19.6 (H) 11.5 - 15.5 %   Platelets 97 (L) 150 - 400 K/uL   nRBC 0.0 0.0 - 0.2 %   Neutrophils Relative % 66 %   Neutro Abs 3.8 1.7 - 7.7 K/uL   Lymphocytes Relative 22 %   Lymphs Abs 1.3 0.7 - 4.0 K/uL   Monocytes Relative 10 %   Monocytes Absolute 0.6 0 - 1 K/uL   Eosinophils Relative 1 %   Eosinophils Absolute 0.0 0 - 0 K/uL   Basophils Relative 1 %   Basophils Absolute 0.0 0 - 0 K/uL    Immature Granulocytes 0 %   Abs Immature Granulocytes 0.02 0.00 - 0.07 K/uL  Magnesium     Status: None   Collection Time: 06/22/20  6:07 AM  Result Value Ref Range   Magnesium 2.1 1.7 - 2.4 mg/dL  Procalcitonin     Status: None   Collection Time: 06/22/20  6:07 AM  Result Value Ref Range   Procalcitonin <0.10 ng/mL    Recent Results (from the past 240 hour(s))  Culture, blood (routine x 2)     Status: Abnormal (Preliminary result)   Collection Time: 06/06/2020  3:40 AM   Specimen: BLOOD  Result Value Ref Range Status   Specimen Description   Final    BLOOD LEFT [facility] Performed at [facility], [address] [place], [place] [address]  Special Requests   Final    BOTTLES DRAWN AEROBIC AND ANAEROBIC Blood Culture adequate volume Performed at [facility], [address] [place], [place] [address]    Culture  Setup Time   Final    GRAM POSITIVE COCCI AEROBIC BOTTLE ONLY CRITICAL VALUE NOTED.  VALUE IS CONSISTENT WITH PREVIOUSLY REPORTED AND CALLED VALUE. Performed at [facility], [address] [place], [place] [address]    Culture (A)  Final    STAPHYLOCOCCUS CAPITIS STAPHYLOCOCCUS EPIDERMIDIS    Report Status PENDING  Incomplete  Culture, blood (routine x 2)     Status: Abnormal (Preliminary result)   Collection Time: 06/15/2020  3:40 AM   Specimen: BLOOD  Result Value Ref Range Status   Specimen Description   Final    BLOOD RIGHT FA Performed at [facility], [address] [place], [place] [address]    Special Requests   Final    BOTTLES DRAWN AEROBIC AND ANAEROBIC Blood Culture adequate volume Performed at [facility], [address] [place], [place] [address]    Culture  Setup Time   Final    Organism ID to follow GRAM POSITIVE COCCI IN BOTH AEROBIC AND ANAEROBIC BOTTLES CRITICAL RESULT CALLED TO, READ BACK BY AND VERIFIED WITH: SUSAN WATSON AT 1833 06/18/2020.PMF Performed at [facility], [address] [place],  [address]    Culture (A)  Final    STAPHYLOCOCCUS HOMINIS STAPHYLOCOCCUS EPIDERMIDIS    Report Status PENDING  Incomplete  Blood Culture ID Panel (Reflexed)     Status: Abnormal   Collection Time: 06/04/2020  3:40 AM  Result Value Ref Range Status   Enterococcus faecalis NOT DETECTED NOT DETECTED Final   Enterococcus Faecium NOT DETECTED NOT DETECTED Final   Listeria monocytogenes NOT DETECTED NOT DETECTED Final   Staphylococcus species DETECTED (A) NOT DETECTED Final    Comment: CRITICAL RESULT CALLED TO, READ BACK BY AND VERIFIED WITH: SUSAN WATSON AT 1833 06/03/2020.PMF    Staphylococcus aureus (BCID) NOT DETECTED NOT DETECTED Final   Staphylococcus epidermidis DETECTED (A) NOT DETECTED Final    Comment: Methicillin (oxacillin) resistant coagulase negative staphylococcus. Possible blood culture contaminant (unless isolated from more than one blood culture draw or clinical case suggests pathogenicity). No antibiotic treatment is indicated for blood  culture contaminants. CRITICAL RESULT CALLED TO, READ BACK BY AND VERIFIED WITH: SUSAN WATSON AT 9937 06/15/2020.PMF    Staphylococcus lugdunensis NOT DETECTED NOT DETECTED Final   Streptococcus species NOT DETECTED NOT DETECTED Final   Streptococcus agalactiae NOT DETECTED NOT DETECTED Final   Streptococcus pneumoniae NOT DETECTED NOT DETECTED Final   Streptococcus pyogenes NOT DETECTED NOT DETECTED Final   A.calcoaceticus-baumannii NOT DETECTED NOT DETECTED Final   Bacteroides fragilis NOT DETECTED NOT DETECTED Final   Enterobacterales NOT DETECTED NOT DETECTED Final   Enterobacter cloacae complex NOT DETECTED NOT DETECTED Final   Escherichia coli NOT DETECTED NOT DETECTED Final   Klebsiella aerogenes NOT DETECTED NOT DETECTED Final   Klebsiella oxytoca NOT DETECTED NOT DETECTED Final   Klebsiella pneumoniae NOT DETECTED NOT DETECTED Final   Proteus species NOT DETECTED NOT DETECTED Final   Salmonella species NOT  DETECTED NOT DETECTED Final   Serratia marcescens NOT DETECTED NOT DETECTED Final   Haemophilus influenzae NOT DETECTED NOT DETECTED Final   Neisseria meningitidis NOT DETECTED NOT DETECTED Final   Pseudomonas aeruginosa NOT DETECTED NOT DETECTED Final   Stenotrophomonas maltophilia NOT DETECTED NOT DETECTED Final   Candida albicans NOT  DETECTED NOT DETECTED Final   Candida auris NOT DETECTED NOT DETECTED Final   Candida glabrata NOT DETECTED NOT DETECTED Final   Candida krusei NOT DETECTED NOT DETECTED Final   Candida parapsilosis NOT DETECTED NOT DETECTED Final   Candida tropicalis NOT DETECTED NOT DETECTED Final   Cryptococcus neoformans/gattii NOT DETECTED NOT DETECTED Final   Methicillin resistance mecA/C DETECTED (A) NOT DETECTED Final    Comment: CRITICAL RESULT CALLED TO, READ BACK BY AND VERIFIED WITH: SUSAN WATSON AT 1833 07/02/2020.PMF Performed at [facility], [address] [place], [place] [address]   SARS Coronavirus 2 by RT PCR (hospital order, performed in [facility] hospital lab) Nasopharyngeal Nasopharyngeal Swab     Status: None   Collection Time: 06/27/2020 12:01 PM   Specimen: Nasopharyngeal Swab  Result Value Ref Range Status   SARS Coronavirus 2 NEGATIVE NEGATIVE Final    Comment: (NOTE) SARS-CoV-2 target nucleic acids are NOT DETECTED.  The SARS-CoV-2 RNA is generally detectable in upper and lower respiratory specimens during the acute phase of infection. The lowest concentration of SARS-CoV-2 viral copies this assay can detect is 250 copies / mL. A negative result does not preclude SARS-CoV-2 infection and should not be used as the sole basis for treatment or other patient management decisions.  A negative result may occur with improper specimen collection / handling, submission of specimen other than nasopharyngeal swab, presence of viral mutation(s) within the areas targeted by this assay, and inadequate number of viral copies (<250 copies / mL).  A negative result must be combined with clinical observations, patient history, and epidemiological information.  Fact Sheet for Patients:   StrictlyIdeas.no  Fact Sheet for Healthcare Providers: BankingDealers.co.za  This test is not yet approved or  cleared by the [place] FDA and has been authorized for detection and/or diagnosis of SARS-CoV-2 by FDA under an Emergency Use Authorization (EUA).  This EUA will remain in effect (meaning this test can be used) for the duration of the COVID-19 declaration under Section 564(b)(1) of the Act, 21 U.S.C. section 360bbb-3(b)(1), unless the authorization is terminated or revoked sooner.  Performed at [facility], [address] [place], [place] [address]   CULTURE, BLOOD (ROUTINE X 2) w Reflex to ID Panel     Status: None (Preliminary result)   Collection Time: 06/20/20  5:34 PM   Specimen: BLOOD  Result Value Ref Range Status   Specimen Description BLOOD [facility]  Final   Special Requests   Final    BOTTLES DRAWN AEROBIC AND ANAEROBIC Blood Culture adequate volume   Culture   Final    NO GROWTH 2 DAYS Performed at [facility], [address] [place], [place] [address]    Report Status PENDING  Incomplete  CULTURE, BLOOD (ROUTINE X 2) w Reflex to ID Panel     Status: None (Preliminary result)   Collection Time: 06/20/20  5:34 PM   Specimen: BLOOD  Result Value Ref Range Status   Specimen Description BLOOD RAC  Final   Special Requests   Final    BOTTLES DRAWN AEROBIC AND ANAEROBIC Blood Culture adequate volume   Culture   Final    NO GROWTH 2 DAYS Performed at [facility], [address] [place], [place] [address]    Report Status PENDING  Incomplete     Radiology Studies: No results found. DG Chest 2 View  Final Result      Scheduled Meds: . allopurinol  50 mg Oral Daily  .  atorvastatin  20 mg Oral Daily  . ferrous sulfate  325 mg Oral Q  breakfast  . furosemide  40 mg Intravenous TID  . gabapentin  300 mg Oral TID  . metoprolol succinate  100 mg Oral Daily  . polyethylene glycol  17 g Oral Daily  . Rivaroxaban  15 mg Oral Q supper  . senna-docusate  1 tablet Oral BID  . sodium chloride flush  3 mL Intravenous Q12H   PRN Meds: sodium chloride, acetaminophen, ipratropium-albuterol, ondansetron (ZOFRAN) IV, sodium chloride flush Continuous Infusions: . sodium chloride        LOS: 3 days  Time spent: Greater than 50% of the 35 minute visit was spent in counseling/coordination of care for the patient as laid out in the A&P.   Dwyane Dee, MD [facility] 06/22/2020, 12:53 PM  Contact via secure chat.  To contact the attending provider between 7A-7P or the covering provider during after hours 7P-7A, please log into the web site [email or web address] and access using universal [place] password for that web site. If you do not have the password, please call the hospital operator.

## 2020-06-22 NOTE — Evaluation (Signed)
Physical Therapy Evaluation Patient Details Name: Kristy Solomon MRN: [id number] DOB: [birth year] Today's Date: 06/22/2020   History of Present Illness  Kristy Solomon is an 86 y/o female who was admitted for worsening SOB with an increase in O2 to 3L due to symptoms. In ED, pt was tachycardic and in rapid A-fib w/ HR to 170. PMH includes chronic respiratory failure on 2L O2 continuous, chrinic diastolic dysfunction, A-fib on chronic anticoagulation therapy, HTN with complications of CKD III-IV, covid-19 virus infection April 2021, and history of malignant lymphoma.  Clinical Impression  Pt lying in bed upon arrival to room and agreeable to PT evaluation. Pt kept her eyes closed for most of questioning portion of eval however pt still engaged and actively answering questions. Pt required mod A for supine to fit for trunk elevation and BLE management to and over edge of bed. Pt with difficulty moving BLE against gravity, potentially secondary to increased weight from fluid retention. Pt performed sit <> stand to RW with min A for boosting to standing and for eccentric control on lowering. As soon as pt upright, pt with incontinent episodes of urine with large output despite purewick placement. Deferred further ambulation attempt at this time secondary to increased urinary output. Pt ambulated 2 feet to recliner chair using RW with min A for steadying. Pt urinated on self and floor and increased time taken for pt cleanup, sock change, and floor cleanup. Anticipate pt will require min A for further ambulation attempts. Pt remained on 2L O2 via nasal cannula throughout session and remained above 97%. Pt presents with deficits in strength, functional mobility, and endurance. Pt would benefit from skilled PT this acute stay and HHPT at discharge to optimize return to PLOF and maximize functional mobility, safety, and independence.     Follow Up Recommendations Home health PT    Equipment Recommendations   None recommended by PT;Other (comment) (pt already has equipment)    Recommendations for Other Services       Precautions / Restrictions Precautions Precautions: Fall Restrictions Weight Bearing Restrictions: No      Mobility  Bed Mobility Overal bed mobility: Needs Assistance Bed Mobility: Supine to Sit     Supine to sit: Mod assist;HOB elevated     General bed mobility comments: Mod A for supine to sit fro trunk elevation into sitting to bring trunk into upright sitting and for BLE management to and over edge of bed.  Transfers Overall transfer level: Needs assistance Equipment used: Rolling walker (2 wheeled) Transfers: Sit to/from Stand Sit to Stand: Min assist         General transfer comment: Min A for sit <> stand transfer for boosting to stand and eccentric control on descent. Verbal cues for hand and foot placement and to power through BLE to come into standing.  Ambulation/Gait Ambulation/Gait assistance: Min assist Gait Distance (Feet): 2 Feet Assistive device: Rolling walker (2 wheeled)   Gait velocity: decreased   General Gait Details: Pt ambulated 2 feet from bed to chair using RW with min A for steadying and safety. Pt with short step lengths bilaterally with decreased foot clearance bilaterally.  Stairs            Wheelchair Mobility    Modified Rankin (Stroke Patients Only)       Balance Overall balance assessment: Needs assistance Sitting-balance support: Feet supported;Bilateral upper extremity supported;No upper extremity supported Sitting balance-Leahy Scale: Good Sitting balance - Comments: noted good sitting balance with and without BUE support  on bed.   Standing balance support: Bilateral upper extremity supported;During functional activity Standing balance-Leahy Scale: Fair Standing balance comment: fair standing balance with BUE support on RW with CGA for static balance and min A for dynamic standing balance                              Pertinent Vitals/Pain Pain Assessment: No/denies pain    Home Living Family/patient expects to be discharged to:: Private residence Living Arrangements: Alone Available Help at Discharge: Family;Available PRN/intermittently Type of Home: House Home Access: Stairs to enter Entrance Stairs-Rails: Left Entrance Stairs-Number of Steps: 4 Home Layout: One level Home Equipment: Other (comment);Shower seat;Grab bars - tub/shower;Walker - 2 wheels (walking stick)      Prior Function Level of Independence: Independent with assistive device(s)         Comments: Pt reports independence with ambulation and transfers with use of walking stick, denies fall history in last 6 months, and reports being primarily a household ambulator.      Hand Dominance        Extremity/Trunk Assessment   Upper Extremity Assessment Upper Extremity Assessment: Generalized weakness (grossly 3+ to 4-/5 bilaterally)    Lower Extremity Assessment Lower Extremity Assessment: Generalized weakness (pt with difficulty moving BLE against gravity)       Communication   Communication: No difficulties  Cognition Arousal/Alertness: Awake/alert Behavior During Therapy: WFL for tasks assessed/performed;Flat affect Overall Cognitive Status: Within Functional Limits for tasks assessed                                 General Comments: Pt alert and oriented to name, DOB, and location. She reports current year as 2019. Pt kept eyes closed for most of questioning portion of evaluation however able to answer questions appropriately.      General Comments      Exercises Other Exercises Other Exercises: pt has incontinent episodes of urine during each standing attempt; required increased time to clean up patient and floor Other Exercises: pt performed LAQ x 10 bilaterally while sitting in chair   Assessment/Plan    PT Assessment Patient needs continued PT services  PT  Problem List Decreased strength;Decreased activity tolerance;Decreased balance;Decreased mobility;Decreased cognition       PT Treatment Interventions DME instruction;Gait training;Stair training;Functional mobility training;Therapeutic activities;Therapeutic exercise;Balance training;Patient/family education    PT Goals (Current goals can be found in the Care Plan section)  Acute Rehab PT Goals Patient Stated Goal: none stated PT Goal Formulation: With patient Time For Goal Achievement: 07/21/2020 Potential to Achieve Goals: Good    Frequency Min 2X/week   Barriers to discharge        Co-evaluation               AM-PAC PT "6 Clicks" Mobility  Outcome Measure Help needed turning from your back to your side while in a flat bed without using bedrails?: A Little Help needed moving from lying on your back to sitting on the side of a flat bed without using bedrails?: A Lot Help needed moving to and from a bed to a chair (including a wheelchair)?: A Little Help needed standing up from a chair using your arms (e.g., wheelchair or bedside chair)?: A Little Help needed to walk in hospital room?: A Little Help needed climbing 3-5 steps with a railing? : A Lot 6 Click Score: 16  End of Session Equipment Utilized During Treatment: Gait belt;Oxygen;Other (comment) (2L O2 via nasal cannula) Activity Tolerance: Patient limited by fatigue Patient left: in chair;with call bell/phone within reach;with chair alarm set Nurse Communication: Mobility status PT Visit Diagnosis: Unsteadiness on feet (R26.81);Other abnormalities of gait and mobility (R26.89);Muscle weakness (generalized) (M62.81)    Time: 6681-5947 PT Time Calculation (min) (ACUTE ONLY): 31 min   Charges:              Vale Haven, SPT  Vale Haven 06/22/2020, 4:29 PM

## 2020-06-22 NOTE — Progress Notes (Signed)
Progress Note  Patient Name: Kristy Solomon Date of Encounter: 06/22/2020  Primary Cardiologist: Agbor-Etang  Subjective   Dyspnea improving, she remains on supplemental oxygen via nasal cannula at 4 L. No chest pain, palpitations, dizziness, presyncope, or syncope. Lower extremity swelling persists. Weight 102.4-->103.2 kg. Documented UOP 1.4 L for the past 24 hours with a net - 3.1 L for the admission. Renal function stable at 43/2.00.  Inpatient Medications    Scheduled Meds:  allopurinol  50 mg Oral Daily   atorvastatin  20 mg Oral Daily   ferrous sulfate  325 mg Oral Q breakfast   furosemide  40 mg Intravenous TID   gabapentin  300 mg Oral TID   Ibrutinib   Oral Daily   metoprolol succinate  100 mg Oral Daily   polyethylene glycol  17 g Oral Daily   Rivaroxaban  15 mg Oral Q supper   senna-docusate  1 tablet Oral BID   sodium chloride flush  3 mL Intravenous Q12H   Continuous Infusions:  sodium chloride     PRN Meds: sodium chloride, acetaminophen, ipratropium-albuterol, ondansetron (ZOFRAN) IV, sodium chloride flush   Vital Signs    Vitals:   06/22/20 0205 06/22/20 0439 06/22/20 0635 06/22/20 0736  BP:  107/66  94/69  Pulse:  82  97  Resp: (!) 22 16  17   Temp:  98.2 F (36.8 C)  97.9 F (36.6 C)  TempSrc:  Oral  Oral  SpO2:  97%  96%  Weight:   103.2 kg   Height:        Intake/Output Summary (Last 24 hours) at 06/22/2020 0867 Last data filed at 06/21/2020 1803 Gross per 24 hour  Intake --  Output 850 ml  Net -850 ml   Filed Weights   06/20/20 0304 06/21/20 0427 06/22/20 0635  Weight: 101.9 kg 102.4 kg 103.2 kg    Telemetry    Afib with occasional PVCs, ventricular bigeminy, Afib with aberrancy vs NSVT  - Personally Reviewed  ECG    No new tracings - Personally Reviewed  Physical Exam   GEN: No acute distress.   Neck: JVD mildly elevated. Cardiac: Irregularly irregular, no murmurs, rubs, or gallops.  Respiratory:  Diminished breath sounds bilaterally.  GI: Soft, nontender, non-distended.   MS: 1-2+ bilateral lower extremity pitting edema to the thighs; No deformity. Neuro:  Alert and oriented x 3; Nonfocal.  Psych: Normal affect.  Labs    Chemistry Recent Labs  Lab 06/20/20 0636 06/21/20 0405 06/22/20 0607  NA 140 141 143  K 4.7 4.1 3.8  CL 110 109 109  CO2 22 25 26   GLUCOSE 111* 124* 135*  BUN 40* 43* 43*  CREATININE 1.95* 2.05* 2.00*  CALCIUM 8.6* 8.2* 8.3*  GFRNONAA 22* 21* 22*  GFRAA 26* 24* 25*  ANIONGAP 8 7 8      Hematology Recent Labs  Lab 06/04/2020 0117 06/21/20 0405 06/22/20 0607  WBC 4.7 5.0 5.8  RBC 5.51* 4.97 5.26*  HGB 12.4 11.3* 11.8*  HCT 41.7 37.0 39.8  MCV 75.7* 74.4* 75.7*  MCH 22.5* 22.7* 22.4*  MCHC 29.7* 30.5 29.6*  RDW 19.9* 19.4* 19.6*  PLT 98* 86* 97*    Cardiac EnzymesNo results for input(s): TROPONINI in the last 168 hours. No results for input(s): TROPIPOC in the last 168 hours.   BNP Recent Labs  Lab 06/20/20 0636  BNP 672.1*     DDimer No results for input(s): DDIMER in the last 168 hours.  Radiology    No results found.  Cardiac Studies   Echo 12/2019 1. Left ventricular ejection fraction, by estimation, is 50 to 55%. The  left ventricle has low normal function. The left ventricle has no regional  wall motion abnormalities. There is mild left ventricular hypertrophy.  Left ventricular diastolic  parameters are indeterminate.  2. Right ventricular systolic function is normal. The right ventricular  size is normal. Tricuspid regurgitation signal is inadequate for assessing  PA pressure.  3. Left atrial size was mildly dilated.  4. Right atrial size was mildly dilated.  5. The mitral valve is abnormal. Mild mitral valve regurgitation. No  evidence of mitral stenosis.  6. Tricuspid valve regurgitation is mild to moderate.  7. The aortic valve is tricuspid. Aortic valve regurgitation is not  visualized. No aortic  stenosis is present.  8. The inferior vena cava is dilated in size with <50% respiratory  variability, suggesting right atrial pressure of 15 mmHg.  Patient Profile     86 y.o. female with history of permanent atrial fibrillation, HFpEF, CKD stage IV, hypertension, lymphoma, and anemia of chronic disease who presented due to shortness of breath and volume overload in the setting of medication noncompliance.  Assessment & Plan    1. HFpEF: -She remains significantly volume up -UOP 1.4 L for the past 24 hours with a net - 3.1 L for the admission -Documented weight in the office on 05/08/2020 of 198 pounds with a current weight of 227 pounds -Continue IV Lasix 40 mg tid with goal UOP of 1-2 L per 24 hours with close monitoring of her renal function -Leading up to her admission, she had been off torsemide for ~ 2 weeks -Daily weights -Strict I/O -CHF education   2. Permanent Afib: -Ventricular rates controlled -Continue Toprol XL -CHADS2VASc 5 (CHF, HTN, age x 2, gender) -Xarelto 15 mg -Estimated Creatinine Clearance: 22.7 mL/min (A) (by C-G formula based on SCr of 2 mg/dL (H)).  3. Acute on CKD stage IV: -Nephrology following  -SCr stable  4. HTN: -Blood pressure in the 90s to low 034J systolic mostly -Asymptomatic  -Continue medications as above  5. Anemia of chronic disease: -Stable   For questions or updates, please contact [place] Please consult [email or web address] for contact info under Cardiology/STEMI.    Signed, Christell Faith, PA-C [place] Pager: [phone] 06/22/2020, 8:32 AM

## 2020-06-22 NOTE — Plan of Care (Signed)

## 2020-06-22 NOTE — Progress Notes (Signed)
[facility]  ROUNDING NOTE   Subjective:   UOP 1434mL. Furosemide 40mg  IV tid .   Patient states her edema has improved. Eating breakfast when examined.   Objective:  Vital signs in last 24 hours:  Temp:  [97.9 F (36.6 C)-98.8 F (37.1 C)] 98.2 F (36.8 C) (09/20 1104) Pulse Rate:  [82-100] 83 (09/20 1104) Resp:  [16-22] 17 (09/20 1104) BP: (94-127)/(64-86) 114/80 (09/20 1104) SpO2:  [84 %-100 %] 98 % (09/20 1104) Weight:  [103.2 kg] 103.2 kg (09/20 0635)  Weight change: 0.823 kg Filed Weights   06/20/20 0304 06/21/20 0427 06/22/20 0635  Weight: 101.9 kg 102.4 kg 103.2 kg    Intake/Output: I/O last 3 completed shifts: In: 72 [P.O.:360; Other:360] Out: 3050 [Urine:3050]   Intake/Output this shift:  Total I/O In: 123 [P.O.:120; I.V.:3] Out: 300 [Urine:300]  Physical Exam: General: NAD, sitting up in bed  Head: Normocephalic, atraumatic. Moist oral mucosal membranes  Eyes: Anicteric, PERRL  Neck: Supple, trachea midline  Lungs:  Bilateral basilar crackles, 2L [place] O2  Heart: Regular rate and rhythm  Abdomen:  Soft, nontender,   Extremities:  ++ peripheral edema.  Neurologic: Nonfocal, moving all four extremities  Skin: No lesions        Basic Metabolic Panel: Recent Labs  Lab 06/17/2020 0117 06/22/2020 0117 06/20/20 0636 06/21/20 0405 06/22/20 0607  NA 145  --  140 141 143  K 4.3  --  4.7 4.1 3.8  CL 112*  --  110 109 109  CO2 22  --  22 25 26   GLUCOSE 111*  --  111* 124* 135*  BUN 38*  --  40* 43* 43*  CREATININE 2.08*  --  1.95* 2.05* 2.00*  CALCIUM 8.5*   < > 8.6* 8.2* 8.3*  MG  --   --   --  2.0 2.1   < > = values in this interval not displayed.    Liver Function Tests: No results for input(s): AST, ALT, ALKPHOS, BILITOT, PROT, ALBUMIN in the last 168 hours. No results for input(s): LIPASE, AMYLASE in the last 168 hours. No results for input(s): AMMONIA in the last 168 hours.  CBC: Recent Labs  Lab 06/17/2020 0117 06/21/20 0405  06/22/20 0607  WBC 4.7 5.0 5.8  NEUTROABS  --  3.0 3.8  HGB 12.4 11.3* 11.8*  HCT 41.7 37.0 39.8  MCV 75.7* 74.4* 75.7*  PLT 98* 86* 97*    Cardiac Enzymes: No results for input(s): CKTOTAL, CKMB, CKMBINDEX, TROPONINI in the last 168 hours.  BNP: Invalid input(s): POCBNP  CBG: No results for input(s): GLUCAP in the last 168 hours.  Microbiology: Results for orders placed or performed during the hospital encounter of 06/20/2020  Culture, blood (routine x 2)     Status: Abnormal (Preliminary result)   Collection Time: 07/02/2020  3:40 AM   Specimen: BLOOD  Result Value Ref Range Status   Specimen Description   Final    BLOOD LEFT [facility] Performed at [facility], [address] [place], [place] [address]    Special Requests   Final    BOTTLES DRAWN AEROBIC AND ANAEROBIC Blood Culture adequate volume Performed at [facility], [address], [place], [place] [address]    Culture  Setup Time   Final    GRAM POSITIVE COCCI AEROBIC BOTTLE ONLY CRITICAL VALUE NOTED.  VALUE IS CONSISTENT WITH PREVIOUSLY REPORTED AND CALLED VALUE. Performed at [facility], [address], [place], [place] [address]  Culture (A)  Final    STAPHYLOCOCCUS CAPITIS STAPHYLOCOCCUS EPIDERMIDIS    Report Status PENDING  Incomplete  Culture, blood (routine x 2)     Status: Abnormal (Preliminary result)   Collection Time: 06/26/2020  3:40 AM   Specimen: BLOOD  Result Value Ref Range Status   Specimen Description   Final    BLOOD RIGHT FA Performed at [facility], [address], [place], [place] [address]    Special Requests   Final    BOTTLES DRAWN AEROBIC AND ANAEROBIC Blood Culture adequate volume Performed at [facility], [address] [place], [place] [address]    Culture  Setup Time   Final    Organism ID to follow GRAM POSITIVE COCCI IN BOTH AEROBIC AND ANAEROBIC BOTTLES CRITICAL RESULT CALLED TO, READ BACK BY AND VERIFIED WITH: SUSAN  WATSON AT 1833 06/13/2020.PMF Performed at [facility], [address] [place], [place] [address]    Culture (A)  Final    STAPHYLOCOCCUS HOMINIS STAPHYLOCOCCUS EPIDERMIDIS    Report Status PENDING  Incomplete  Blood Culture ID Panel (Reflexed)     Status: Abnormal   Collection Time: 06/05/2020  3:40 AM  Result Value Ref Range Status   Enterococcus faecalis NOT DETECTED NOT DETECTED Final   Enterococcus Faecium NOT DETECTED NOT DETECTED Final   Listeria monocytogenes NOT DETECTED NOT DETECTED Final   Staphylococcus species DETECTED (A) NOT DETECTED Final    Comment: CRITICAL RESULT CALLED TO, READ BACK BY AND VERIFIED WITH: SUSAN WATSON AT 1833 06/13/2020.PMF    Staphylococcus aureus (BCID) NOT DETECTED NOT DETECTED Final   Staphylococcus epidermidis DETECTED (A) NOT DETECTED Final    Comment: Methicillin (oxacillin) resistant coagulase negative staphylococcus. Possible blood culture contaminant (unless isolated from more than one blood culture draw or clinical case suggests pathogenicity). No antibiotic treatment is indicated for blood  culture contaminants. CRITICAL RESULT CALLED TO, READ BACK BY AND VERIFIED WITH: SUSAN WATSON AT 3419 06/14/2020.PMF    Staphylococcus lugdunensis NOT DETECTED NOT DETECTED Final   Streptococcus species NOT DETECTED NOT DETECTED Final   Streptococcus agalactiae NOT DETECTED NOT DETECTED Final   Streptococcus pneumoniae NOT DETECTED NOT DETECTED Final   Streptococcus pyogenes NOT DETECTED NOT DETECTED Final   A.calcoaceticus-baumannii NOT DETECTED NOT DETECTED Final   Bacteroides fragilis NOT DETECTED NOT DETECTED Final   Enterobacterales NOT DETECTED NOT DETECTED Final   Enterobacter cloacae complex NOT DETECTED NOT DETECTED Final   Escherichia coli NOT DETECTED NOT DETECTED Final   Klebsiella aerogenes NOT DETECTED NOT DETECTED Final   Klebsiella oxytoca NOT DETECTED NOT DETECTED Final   Klebsiella pneumoniae NOT DETECTED NOT DETECTED Final    Proteus species NOT DETECTED NOT DETECTED Final   Salmonella species NOT DETECTED NOT DETECTED Final   Serratia marcescens NOT DETECTED NOT DETECTED Final   Haemophilus influenzae NOT DETECTED NOT DETECTED Final   Neisseria meningitidis NOT DETECTED NOT DETECTED Final   Pseudomonas aeruginosa NOT DETECTED NOT DETECTED Final   Stenotrophomonas maltophilia NOT DETECTED NOT DETECTED Final   Candida albicans NOT DETECTED NOT DETECTED Final   Candida auris NOT DETECTED NOT DETECTED Final   Candida glabrata NOT DETECTED NOT DETECTED Final   Candida krusei NOT DETECTED NOT DETECTED Final   Candida parapsilosis NOT DETECTED NOT DETECTED Final   Candida tropicalis NOT DETECTED NOT DETECTED Final   Cryptococcus neoformans/gattii NOT DETECTED NOT DETECTED Final   Methicillin resistance mecA/C DETECTED (A) NOT DETECTED Final    Comment: CRITICAL RESULT CALLED TO,  READ BACK BY AND VERIFIED WITH: SUSAN WATSON AT 0630 07/02/2020.PMF Performed at [facility], [address] [place], [place] [address]   SARS Coronavirus 2 by RT PCR (hospital order, performed in [facility] hospital lab) Nasopharyngeal Nasopharyngeal Swab     Status: None   Collection Time: 06/07/2020 12:01 PM   Specimen: Nasopharyngeal Swab  Result Value Ref Range Status   SARS Coronavirus 2 NEGATIVE NEGATIVE Final    Comment: (NOTE) SARS-CoV-2 target nucleic acids are NOT DETECTED.  The SARS-CoV-2 RNA is generally detectable in upper and lower respiratory specimens during the acute phase of infection. The lowest concentration of SARS-CoV-2 viral copies this assay can detect is 250 copies / mL. A negative result does not preclude SARS-CoV-2 infection and should not be used as the sole basis for treatment or other patient management decisions.  A negative result may occur with improper specimen collection / handling, submission of specimen other than nasopharyngeal swab, presence of viral mutation(s) within the areas targeted  by this assay, and inadequate number of viral copies (<250 copies / mL). A negative result must be combined with clinical observations, patient history, and epidemiological information.  Fact Sheet for Patients:   StrictlyIdeas.no  Fact Sheet for Healthcare Providers: BankingDealers.co.za  This test is not yet approved or  cleared by the [place] FDA and has been authorized for detection and/or diagnosis of SARS-CoV-2 by FDA under an Emergency Use Authorization (EUA).  This EUA will remain in effect (meaning this test can be used) for the duration of the COVID-19 declaration under Section 564(b)(1) of the Act, 21 U.S.C. section 360bbb-3(b)(1), unless the authorization is terminated or revoked sooner.  Performed at [facility], [address] [place], [place] [address]   CULTURE, BLOOD (ROUTINE X 2) w Reflex to ID Panel     Status: None (Preliminary result)   Collection Time: 06/20/20  5:34 PM   Specimen: BLOOD  Result Value Ref Range Status   Specimen Description BLOOD [facility]  Final   Special Requests   Final    BOTTLES DRAWN AEROBIC AND ANAEROBIC Blood Culture adequate volume   Culture   Final    NO GROWTH 2 DAYS Performed at [facility], [address] [place], [place] [address]    Report Status PENDING  Incomplete  CULTURE, BLOOD (ROUTINE X 2) w Reflex to ID Panel     Status: None (Preliminary result)   Collection Time: 06/20/20  5:34 PM   Specimen: BLOOD  Result Value Ref Range Status   Specimen Description BLOOD RAC  Final   Special Requests   Final    BOTTLES DRAWN AEROBIC AND ANAEROBIC Blood Culture adequate volume   Culture   Final    NO GROWTH 2 DAYS Performed at [facility], [address], [place], [place] [address]    Report Status PENDING  Incomplete    Coagulation Studies: No results for input(s): LABPROT, INR in the last 72 hours.  Urinalysis: No results for input(s):  COLORURINE, LABSPEC, PHURINE, GLUCOSEU, HGBUR, BILIRUBINUR, KETONESUR, PROTEINUR, UROBILINOGEN, NITRITE, LEUKOCYTESUR in the last 72 hours.  Invalid input(s): APPERANCEUR    Imaging: No results found.   Medications:   . sodium chloride     . allopurinol  50 mg Oral Daily  . atorvastatin  20 mg Oral Daily  . ferrous sulfate  325 mg Oral Q breakfast  . furosemide  40 mg Intravenous TID  . gabapentin  300 mg Oral TID  . metoprolol  succinate  100 mg Oral Daily  . polyethylene glycol  17 g Oral Daily  . Rivaroxaban  15 mg Oral Q supper  . senna-docusate  1 tablet Oral BID  . sodium chloride flush  3 mL Intravenous Q12H   sodium chloride, acetaminophen, ipratropium-albuterol, ondansetron (ZOFRAN) IV, sodium chloride flush  Assessment/ Plan:  [name] is a 86 y.o. black female with congestive heart failure, hypertension, atrial fibrillation, hyperlipidemia, history of lymphoma, who was admitted to [facility] on 06/22/2020 for Hypoxia [R09.02] Elevated TSH [R79.89] Acute CHF (congestive heart failure) (HCC) [I50.9] Acute on chronic congestive heart failure, unspecified heart failure type ([place]) [I50.9]  1. Acute renal failure on chronic kidney disease stage IV with proteinuria : baseline creatinine of 1.75, GFR of 26 on 05/08/20.  Acute renal failure secondary to acute cardiorenal syndrome.   2. Hypertension: With acute exacerbation of diastolic congestive heart failure. Home regimen of torsemide 40mg  bid and metoprolol - IV furosemide 40mg     3. Anemia with chronic kidney disease: microcytic.   4. Atrial fibrillation: rate controlled with metoprolol and anticoagulation with rivaroxaban   LOS: 3 Christinia Lambeth 9/20/202112:02 PM

## 2020-06-23 LAB — CBC WITH DIFFERENTIAL/PLATELET
Abs Immature Granulocytes: 0.02 10*3/uL (ref 0.00–0.07)
Basophils Absolute: 0 10*3/uL (ref 0.0–0.1)
Basophils Relative: 1 %
Eosinophils Absolute: 0 10*3/uL (ref 0.0–0.5)
Eosinophils Relative: 1 %
HCT: 41.5 % (ref 36.0–46.0)
Hemoglobin: 12.7 g/dL (ref 12.0–15.0)
Immature Granulocytes: 0 %
Lymphocytes Relative: 20 %
Lymphs Abs: 1.2 10*3/uL (ref 0.7–4.0)
MCH: 22.3 pg — ABNORMAL LOW (ref 26.0–34.0)
MCHC: 30.6 g/dL (ref 30.0–36.0)
MCV: 72.9 fL — ABNORMAL LOW (ref 80.0–100.0)
Monocytes Absolute: 0.8 10*3/uL (ref 0.1–1.0)
Monocytes Relative: 12 %
Neutro Abs: 4.1 10*3/uL (ref 1.7–7.7)
Neutrophils Relative %: 66 %
Platelets: 106 10*3/uL — ABNORMAL LOW (ref 150–400)
RBC: 5.69 MIL/uL — ABNORMAL HIGH (ref 3.87–5.11)
RDW: 19.9 % — ABNORMAL HIGH (ref 11.5–15.5)
Smear Review: NORMAL
WBC: 6.1 10*3/uL (ref 4.0–10.5)
nRBC: 0 % (ref 0.0–0.2)

## 2020-06-23 LAB — MAGNESIUM: Magnesium: 2 mg/dL (ref 1.7–2.4)

## 2020-06-23 LAB — CULTURE, BLOOD (ROUTINE X 2)
Special Requests: ADEQUATE
Special Requests: ADEQUATE

## 2020-06-23 LAB — BASIC METABOLIC PANEL
Anion gap: 11 (ref 5–15)
BUN: 47 mg/dL — ABNORMAL HIGH (ref 8–23)
CO2: 24 mmol/L (ref 22–32)
Calcium: 8.3 mg/dL — ABNORMAL LOW (ref 8.9–10.3)
Chloride: 107 mmol/L (ref 98–111)
Creatinine, Ser: 2.22 mg/dL — ABNORMAL HIGH (ref 0.44–1.00)
GFR calc Af Amer: 22 mL/min — ABNORMAL LOW (ref >60)
GFR calc non Af Amer: 19 mL/min — ABNORMAL LOW (ref >60)
Glucose, Bld: 102 mg/dL — ABNORMAL HIGH (ref 70–99)
Potassium: 4.1 mmol/L (ref 3.5–5.1)
Sodium: 142 mmol/L (ref 135–145)

## 2020-06-23 NOTE — Progress Notes (Signed)
Mobility Specialist - Progress Note   06/23/20 1146  Mobility  Activity Transferred:  Bed to chair  Level of Assistance Minimal assist, patient does 75% or more  Assistive Device Front wheel walker  Distance Ambulated (ft) 3 ft  Mobility Response Tolerated well  Mobility performed by Mobility specialist  $Mobility charge 1 Mobility    Pre-mobility: 91 HR, 131/81 BP, 99% SpO2 Post-mobility: 97 HR, 98% SpO2   Pt sleeping in bed upon arrival. Pt easily awakens. Pt agreed to session. Pt transitioned supine to sitting EOB mod. Assist. Pt S2S w/ min. Assist. Pt urinated after S2S. Pt able to stand at bedside for a couple of minutes while mobility specialist assisted w/ hygiene and wiping the floors. Pt transferred from bedside to chair w/ min. Assist. Overall, pt tolerated session well. Pt left in chair w/ alarm set. PT, nurse and NT entered the room at the end of session.    Kristy Solomon Mobility Specialist  06/23/20, 11:53 AM

## 2020-06-23 NOTE — Progress Notes (Signed)
Physical Therapy Treatment Patient Details Name: Kristy Solomon MRN: [id number] DOB: November 30, 1928 Today's Date: 06/23/2020    History of Present Illness Kristy Solomon is an 86 y/o female who was admitted for worsening SOB with an increase in O2 to 3L due to symptoms. In ED, pt was tachycardic and in rapid A-fib w/ HR to 170. PMH includes chronic respiratory failure on 2L O2 continuous, chrinic diastolic dysfunction, A-fib on chronic anticoagulation therapy, HTN with complications of CKD III-IV, covid-19 virus infection April 2021, and history of malignant lymphoma.    PT Comments    Pt received sitting up in recliner chair with mobility specialist present in room, having just assisted pt to the chair. Pt reports not feeling tired and is willing to participate in PT session at this time. Pt on 2L O2 and remained on throughout session. Pt transferred from recliner chair with CGA for clinician to don brief secondary to urinary frequency secondary to Lasix. Pt then ambulated 20 feet within room with O2 line extender utilizing RW with CGA for steadying/ Pt ambulates with decreased gait speed with slightly flexed trunk. Pt required CGA for standing from chair initially however once verbal cues given for improved hand placement, pt able to progress to SBA. Pt then performed sit <> stands x 5 from recliner chair with SBA. Pt reported beginning to have a BM in the brief. Pt required assistance for pericare and stood resting elbows on RW. Pt fatigued and required sitting rest break half way through. Pt stood again to complete pericare and change disposable absorbant pads in chair. At end of session, nurse tech arrived to replace soiled purewick. Pt left with call bell and phone within reach. Current discharge recommendations remain appropriate.  O2 saturations: Pre: 100% sitting at rest During: 96% with ambulation Post: 98% after ambulation    Follow Up Recommendations  Home health PT     Equipment  Recommendations  Other (comment);None recommended by PT (pt already hasw equipment)    Recommendations for Other Services       Precautions / Restrictions Precautions Precautions: Fall Restrictions Weight Bearing Restrictions: No    Mobility  Bed Mobility               General bed mobility comments: not assessed as pt in recliner upon arrival  Transfers Overall transfer level: Needs assistance Equipment used: Rolling walker (2 wheeled) Transfers: Sit to/from Stand Sit to Stand: Min guard         General transfer comment: CGA for sit <> stand transfers from recliner chair; verbal cues for hand placement on armrests to assist with coming into standing  Ambulation/Gait Ambulation/Gait assistance: Min guard Gait Distance (Feet): 20 Feet Assistive device: Rolling walker (2 wheeled) Gait Pattern/deviations: Step-through pattern;Trunk flexed Gait velocity: decreased   General Gait Details: Pt ambulated with flexed trunk and use of RW; CGA for safety and occasional steadying; pt with reciprocal gait pattern with slightly decreased step lengths bilaterally; pt remained on 2L O2 via nasal cannula   Stairs             Wheelchair Mobility    Modified Rankin (Stroke Patients Only)       Balance Overall balance assessment: Needs assistance Sitting-balance support: Feet supported Sitting balance-Leahy Scale: Good Sitting balance - Comments: pt able to anteriorly shift weight forward to bring trunk off of recliner chair back with good control of sitting balance   Standing balance support: Bilateral upper extremity supported Standing balance-Leahy Scale: Fair Standing balance  comment: BUE support on RW and fair dynamic balance with CGA for safety; good static standing balance with BUE on RW and SBA for safety                            Cognition Arousal/Alertness: Awake/alert Behavior During Therapy: WFL for tasks assessed/performed;Flat  affect Overall Cognitive Status: Within Functional Limits for tasks assessed                                        Exercises Other Exercises Other Exercises: additional time taken as pt had BM in brief just after ambulating and required assistance for pericare; pt stood to RW leaning forward on elbows for pericare; one seated rest break needed secondary to fatigue Other Exercises: sit <> stands x 5 with SBA    General Comments        Pertinent Vitals/Pain Pain Assessment: No/denies pain    Home Living                      Prior Function            PT Goals (current goals can now be found in the care plan section) Acute Rehab PT Goals Patient Stated Goal: none stated PT Goal Formulation: With patient Time For Goal Achievement: 07/23/2020 Potential to Achieve Goals: Good Progress towards PT goals: Progressing toward goals    Frequency    Min 2X/week      PT Plan Current plan remains appropriate    Co-evaluation              AM-PAC PT "6 Clicks" Mobility   Outcome Measure  Help needed turning from your back to your side while in a flat bed without using bedrails?: A Little Help needed moving from lying on your back to sitting on the side of a flat bed without using bedrails?: A Lot Help needed moving to and from a bed to a chair (including a wheelchair)?: A Little Help needed standing up from a chair using your arms (e.g., wheelchair or bedside chair)?: A Little Help needed to walk in hospital room?: A Little Help needed climbing 3-5 steps with a railing? : A Lot 6 Click Score: 16    End of Session Equipment Utilized During Treatment: Gait belt;Oxygen;Other (comment) (2L via nasal cannula - O2 line extender utilized in room) Activity Tolerance: Patient limited by fatigue Patient left: in chair;with call bell/phone within reach;with nursing/sitter in room;with chair alarm set Nurse Communication: Mobility status PT Visit Diagnosis:  Unsteadiness on feet (R26.81);Other abnormalities of gait and mobility (R26.89);Muscle weakness (generalized) (M62.81)     Time: 6314-9702 PT Time Calculation (min) (ACUTE ONLY): 39 min  Charges:                        Vale Haven, SPT   Vale Haven 06/23/2020, 12:48 PM

## 2020-06-23 NOTE — Progress Notes (Signed)
Progress Note  Patient Name: Kristy Solomon Date of Encounter: 06/23/2020  Primary Cardiologist: Agbor-Etang  Subjective   Dyspnea continues to improve on a daily basis.  She remains on supplemental oxygen via nasal cannula at 4 L. No chest pain, palpitations, dizziness, presyncope, or syncope. Lower extremity swelling is improving. Weight 103.2-->98.6 kg. Documented UOP 777 mL for the past 24 hours with a net - 3.8 L for the admission.  Slight bump in renal function this morning with BUN/SCR trending from 43/2.0-47/2.22.  Inpatient Medications    Scheduled Meds: . allopurinol  50 mg Oral Daily  . atorvastatin  20 mg Oral Daily  . ferrous sulfate  325 mg Oral Q breakfast  . gabapentin  300 mg Oral TID  . metoprolol succinate  100 mg Oral Daily  . polyethylene glycol  17 g Oral Daily  . Rivaroxaban  15 mg Oral Q supper  . senna-docusate  1 tablet Oral BID  . sodium chloride flush  3 mL Intravenous Q12H   Continuous Infusions: . sodium chloride     PRN Meds: sodium chloride, acetaminophen, ipratropium-albuterol, ondansetron (ZOFRAN) IV, sodium chloride flush   Vital Signs    Vitals:   06/22/20 1639 06/22/20 1948 06/23/20 0409 06/23/20 0721  BP: 107/63 116/77 112/78 97/79  Pulse: (!) 55 85 83 96  Resp: 16 18 18 17   Temp: 97.6 F (36.4 C) 98.2 F (36.8 C) 98.4 F (36.9 C) 97.9 F (36.6 C)  TempSrc: Oral  Oral Oral  SpO2: 100% 100% 95% 94%  Weight:   98.6 kg   Height:        Intake/Output Summary (Last 24 hours) at 06/23/2020 0930 Last data filed at 06/23/2020 0431 Gross per 24 hour  Intake 0 ml  Output 900 ml  Net -900 ml   Filed Weights   06/21/20 0427 06/22/20 0635 06/23/20 0409  Weight: 102.4 kg 103.2 kg 98.6 kg    Telemetry    Afib with ventricular rates in the 90s bpm with occasional PVCs  - Personally Reviewed  ECG    No new tracings - Personally Reviewed  Physical Exam   GEN: No acute distress.   Neck: JVD mildly elevated. Cardiac:  Irregularly irregular, no murmurs, rubs, or gallops.  Respiratory: Diminished breath sounds bilaterally.  GI: Soft, nontender, non-distended.   MS:  Improving trace bilateral pretibial bilateral lower extremity pitting edema to the mid shins; No deformity. Neuro:  Alert and oriented x 3; Nonfocal.  Psych: Normal affect.  Labs    Chemistry Recent Labs  Lab 06/21/20 0405 06/22/20 0607 06/23/20 0658  NA 141 143 142  K 4.1 3.8 4.1  CL 109 109 107  CO2 25 26 24   GLUCOSE 124* 135* 102*  BUN 43* 43* 47*  CREATININE 2.05* 2.00* 2.22*  CALCIUM 8.2* 8.3* 8.3*  GFRNONAA 21* 22* 19*  GFRAA 24* 25* 22*  ANIONGAP 7 8 11      Hematology Recent Labs  Lab 06/21/20 0405 06/22/20 0607 06/23/20 0658  WBC 5.0 5.8 6.1  RBC 4.97 5.26* 5.69*  HGB 11.3* 11.8* 12.7  HCT 37.0 39.8 41.5  MCV 74.4* 75.7* 72.9*  MCH 22.7* 22.4* 22.3*  MCHC 30.5 29.6* 30.6  RDW 19.4* 19.6* 19.9*  PLT 86* 97* 106*    Cardiac EnzymesNo results for input(s): TROPONINI in the last 168 hours. No results for input(s): TROPIPOC in the last 168 hours.   BNP Recent Labs  Lab 06/20/20 0636  BNP 672.1*  DDimer No results for input(s): DDIMER in the last 168 hours.   Radiology    No results found.  Cardiac Studies   Echo 12/2019 1. Left ventricular ejection fraction, by estimation, is 50 to 55%. The  left ventricle has low normal function. The left ventricle has no regional  wall motion abnormalities. There is mild left ventricular hypertrophy.  Left ventricular diastolic  parameters are indeterminate.  2. Right ventricular systolic function is normal. The right ventricular  size is normal. Tricuspid regurgitation signal is inadequate for assessing  PA pressure.  3. Left atrial size was mildly dilated.  4. Right atrial size was mildly dilated.  5. The mitral valve is abnormal. Mild mitral valve regurgitation. No  evidence of mitral stenosis.  6. Tricuspid valve regurgitation is mild to  moderate.  7. The aortic valve is tricuspid. Aortic valve regurgitation is not  visualized. No aortic stenosis is present.  8. The inferior vena cava is dilated in size with <50% respiratory  variability, suggesting right atrial pressure of 15 mmHg.  Patient Profile     86 y.o. female with history of permanent atrial fibrillation, HFpEF, CKD stage IV, hypertension, lymphoma, and anemia of chronic disease who presented due to shortness of breath and volume overload in the setting of medication noncompliance.  Assessment & Plan    1. HFpEF: -Volume status is improving with slight bump in renal function this morning -Hold IV Lasix with consideration for resuming PTA torsemide 40 mg twice daily on 9/22 pending renal function -Leading up to her admission, she had been off torsemide for ~ 2 weeks which contributed to her presentation with volume overload -Consider addition of Jardiance as an outpatient -Daily weights -Strict I/O -CHF education   2. Permanent Afib: -Ventricular rates reasonably controlled -Continue Toprol XL -CHADS2VASc 5 (CHF, HTN, age x 2, gender) -Xarelto 15 mg -Estimated Creatinine Clearance: 20 mL/min (A) (by C-G formula based on SCr of 2.22 mg/dL (H)).  3. Acute on CKD stage IV: -Nephrology following  -Slight bump in renal function this morning  -Hold IV Lasix for today  4. HTN: -Blood pressure in the 90s to low 292K systolic mostly -Asymptomatic  -Continue medications as above  5. Anemia of chronic disease: -Stable   For questions or updates, please contact [place] Please consult [email or web address] for contact info under Cardiology/STEMI.    Signed, Christell Faith, PA-C [place] Pager: [phone] 06/23/2020, 9:30 AM

## 2020-06-23 NOTE — Progress Notes (Signed)
[facility]  ROUNDING NOTE   Subjective:   UOP 964mL. Furosemide discontinued, last dose at 1635 yesterday   Patient states her edema has improved.  Creatinine 2.22 (2)  Objective:  Vital signs in last 24 hours:  Temp:  [97.6 F (36.4 C)-98.4 F (36.9 C)] 98.2 F (36.8 C) (09/21 1103) Pulse Rate:  [55-96] 89 (09/21 1103) Resp:  [16-18] 18 (09/21 1103) BP: (97-135)/(63-80) 135/80 (09/21 1103) SpO2:  [94 %-100 %] 100 % (09/21 1103) Weight:  [98.6 kg] 98.6 kg (09/21 0409)  Weight change: -4.6 kg Filed Weights   06/21/20 0427 06/22/20 0635 06/23/20 0409  Weight: 102.4 kg 103.2 kg 98.6 kg    Intake/Output: I/O last 3 completed shifts: In: 86 [P.O.:120; I.V.:3] Out: 900 [Urine:900]   Intake/Output this shift:  Total I/O In: -  Out: 300 [Urine:300]  Physical Exam: General: NAD, sitting up in bed  Head: Normocephalic, atraumatic. Moist oral mucosal membranes  Eyes: Anicteric, PERRL  Neck: Supple, trachea midline  Lungs:  Bilateral basilar crackles, 2L [place] O2  Heart: Regular rate and rhythm  Abdomen:  Soft, nontender,   Extremities:  + peripheral edema.  Neurologic: Nonfocal, moving all four extremities  Skin: No lesions        Basic Metabolic Panel: Recent Labs  Lab 06/16/2020 0117 06/08/2020 0117 06/20/20 0636 06/20/20 0636 06/21/20 0405 06/22/20 0607 06/23/20 0658  NA 145  --  140  --  141 143 142  K 4.3  --  4.7  --  4.1 3.8 4.1  CL 112*  --  110  --  109 109 107  CO2 22  --  22  --  25 26 24   GLUCOSE 111*  --  111*  --  124* 135* 102*  BUN 38*  --  40*  --  43* 43* 47*  CREATININE 2.08*  --  1.95*  --  2.05* 2.00* 2.22*  CALCIUM 8.5*   < > 8.6*   < > 8.2* 8.3* 8.3*  MG  --   --   --   --  2.0 2.1 2.0   < > = values in this interval not displayed.    Liver Function Tests: No results for input(s): AST, ALT, ALKPHOS, BILITOT, PROT, ALBUMIN in the last 168 hours. No results for input(s): LIPASE, AMYLASE in the last 168 hours. No results  for input(s): AMMONIA in the last 168 hours.  CBC: Recent Labs  Lab 06/22/2020 0117 06/21/20 0405 06/22/20 0607 06/23/20 0658  WBC 4.7 5.0 5.8 6.1  NEUTROABS  --  3.0 3.8 4.1  HGB 12.4 11.3* 11.8* 12.7  HCT 41.7 37.0 39.8 41.5  MCV 75.7* 74.4* 75.7* 72.9*  PLT 98* 86* 97* 106*    Cardiac Enzymes: No results for input(s): CKTOTAL, CKMB, CKMBINDEX, TROPONINI in the last 168 hours.  BNP: Invalid input(s): POCBNP  CBG: No results for input(s): GLUCAP in the last 168 hours.  Microbiology: Results for orders placed or performed during the hospital encounter of 06/08/2020  Culture, blood (routine x 2)     Status: Abnormal   Collection Time: 06/30/2020  3:40 AM   Specimen: BLOOD  Result Value Ref Range Status   Specimen Description   Final    BLOOD LEFT [facility] Performed at [facility], [address], [place], [place] [address]    Special Requests   Final    BOTTLES DRAWN AEROBIC AND ANAEROBIC Blood Culture adequate volume Performed at [facility], [address] [place],  [place] [address]    Culture  Setup Time   Final    GRAM POSITIVE COCCI AEROBIC BOTTLE ONLY CRITICAL VALUE NOTED.  VALUE IS CONSISTENT WITH PREVIOUSLY REPORTED AND CALLED VALUE. Performed at [facility], [address] [place], [place] [address]    Culture (A)  Final    STAPHYLOCOCCUS CAPITIS STAPHYLOCOCCUS EPIDERMIDIS    Report Status 06/23/2020 FINAL  Final   Organism ID, Bacteria STAPHYLOCOCCUS EPIDERMIDIS  Final      Susceptibility   Staphylococcus epidermidis - MIC*    CIPROFLOXACIN <=0.5 SENSITIVE Sensitive     ERYTHROMYCIN >=8 RESISTANT Resistant     GENTAMICIN <=0.5 SENSITIVE Sensitive     OXACILLIN <=0.25 SENSITIVE Sensitive     TETRACYCLINE <=1 SENSITIVE Sensitive     VANCOMYCIN 1 SENSITIVE Sensitive     TRIMETH/SULFA <=10 SENSITIVE Sensitive     CLINDAMYCIN <=0.25 SENSITIVE Sensitive     RIFAMPIN <=0.5 SENSITIVE Sensitive     Inducible Clindamycin NEGATIVE  Sensitive     * STAPHYLOCOCCUS EPIDERMIDIS  Culture, blood (routine x 2)     Status: Abnormal   Collection Time: 06/06/2020  3:40 AM   Specimen: BLOOD  Result Value Ref Range Status   Specimen Description   Final    BLOOD RIGHT FA Performed at [facility], [address] [place], [place] [address]    Special Requests   Final    BOTTLES DRAWN AEROBIC AND ANAEROBIC Blood Culture adequate volume Performed at [facility], [address] [place], [place] [address]    Culture  Setup Time   Final    GRAM POSITIVE COCCI IN BOTH AEROBIC AND ANAEROBIC BOTTLES CRITICAL RESULT CALLED TO, READ BACK BY AND VERIFIED WITH: SUSAN WATSON AT 2297 06/09/2020.PMF    Culture (A)  Final    STAPHYLOCOCCUS HOMINIS STAPHYLOCOCCUS EPIDERMIDIS THE SIGNIFICANCE OF ISOLATING THIS ORGANISM FROM A SINGLE SET OF BLOOD CULTURES WHEN MULTIPLE SETS ARE DRAWN IS UNCERTAIN. PLEASE NOTIFY THE MICROBIOLOGY DEPARTMENT WITHIN ONE WEEK IF SPECIATION AND SENSITIVITIES ARE REQUIRED. Performed at [facility], [place] [address] [place],  [address]    Report Status 06/23/2020 FINAL  Final  Blood Culture ID Panel (Reflexed)     Status: Abnormal   Collection Time: 06/06/2020  3:40 AM  Result Value Ref Range Status   Enterococcus faecalis NOT DETECTED NOT DETECTED Final   Enterococcus Faecium NOT DETECTED NOT DETECTED Final   Listeria monocytogenes NOT DETECTED NOT DETECTED Final   Staphylococcus species DETECTED (A) NOT DETECTED Final    Comment: CRITICAL RESULT CALLED TO, READ BACK BY AND VERIFIED WITH: SUSAN WATSON AT 1833 06/17/2020.PMF    Staphylococcus aureus (BCID) NOT DETECTED NOT DETECTED Final   Staphylococcus epidermidis DETECTED (A) NOT DETECTED Final    Comment: Methicillin (oxacillin) resistant coagulase negative staphylococcus. Possible blood culture contaminant (unless isolated from more than one blood culture draw or clinical case suggests pathogenicity). No antibiotic treatment is  indicated for blood  culture contaminants. CRITICAL RESULT CALLED TO, READ BACK BY AND VERIFIED WITH: SUSAN WATSON AT 1941 07/01/2020.PMF    Staphylococcus lugdunensis NOT DETECTED NOT DETECTED Final   Streptococcus species NOT DETECTED NOT DETECTED Final   Streptococcus agalactiae NOT DETECTED NOT DETECTED Final   Streptococcus pneumoniae NOT DETECTED NOT DETECTED Final   Streptococcus pyogenes NOT DETECTED NOT DETECTED Final   A.calcoaceticus-baumannii NOT DETECTED NOT DETECTED Final   Bacteroides fragilis NOT DETECTED NOT DETECTED Final   Enterobacterales NOT DETECTED NOT DETECTED Final   Enterobacter  cloacae complex NOT DETECTED NOT DETECTED Final   Escherichia coli NOT DETECTED NOT DETECTED Final   Klebsiella aerogenes NOT DETECTED NOT DETECTED Final   Klebsiella oxytoca NOT DETECTED NOT DETECTED Final   Klebsiella pneumoniae NOT DETECTED NOT DETECTED Final   Proteus species NOT DETECTED NOT DETECTED Final   Salmonella species NOT DETECTED NOT DETECTED Final   Serratia marcescens NOT DETECTED NOT DETECTED Final   Haemophilus influenzae NOT DETECTED NOT DETECTED Final   Neisseria meningitidis NOT DETECTED NOT DETECTED Final   Pseudomonas aeruginosa NOT DETECTED NOT DETECTED Final   Stenotrophomonas maltophilia NOT DETECTED NOT DETECTED Final   Candida albicans NOT DETECTED NOT DETECTED Final   Candida auris NOT DETECTED NOT DETECTED Final   Candida glabrata NOT DETECTED NOT DETECTED Final   Candida krusei NOT DETECTED NOT DETECTED Final   Candida parapsilosis NOT DETECTED NOT DETECTED Final   Candida tropicalis NOT DETECTED NOT DETECTED Final   Cryptococcus neoformans/gattii NOT DETECTED NOT DETECTED Final   Methicillin resistance mecA/C DETECTED (A) NOT DETECTED Final    Comment: CRITICAL RESULT CALLED TO, READ BACK BY AND VERIFIED WITH: SUSAN WATSON AT 1833 06/18/2020.PMF Performed at [facility], [address] [place], [place] [address]   SARS Coronavirus 2 by RT  PCR (hospital order, performed in [facility] hospital lab) Nasopharyngeal Nasopharyngeal Swab     Status: None   Collection Time: 06/13/2020 12:01 PM   Specimen: Nasopharyngeal Swab  Result Value Ref Range Status   SARS Coronavirus 2 NEGATIVE NEGATIVE Final    Comment: (NOTE) SARS-CoV-2 target nucleic acids are NOT DETECTED.  The SARS-CoV-2 RNA is generally detectable in upper and lower respiratory specimens during the acute phase of infection. The lowest concentration of SARS-CoV-2 viral copies this assay can detect is 250 copies / mL. A negative result does not preclude SARS-CoV-2 infection and should not be used as the sole basis for treatment or other patient management decisions.  A negative result may occur with improper specimen collection / handling, submission of specimen other than nasopharyngeal swab, presence of viral mutation(s) within the areas targeted by this assay, and inadequate number of viral copies (<250 copies / mL). A negative result must be combined with clinical observations, patient history, and epidemiological information.  Fact Sheet for Patients:   StrictlyIdeas.no  Fact Sheet for Healthcare Providers: BankingDealers.co.za  This test is not yet approved or  cleared by the [place] FDA and has been authorized for detection and/or diagnosis of SARS-CoV-2 by FDA under an Emergency Use Authorization (EUA).  This EUA will remain in effect (meaning this test can be used) for the duration of the COVID-19 declaration under Section 564(b)(1) of the Act, 21 U.S.C. section 360bbb-3(b)(1), unless the authorization is terminated or revoked sooner.  Performed at [facility], [address] [place], [place] [address]   CULTURE, BLOOD (ROUTINE X 2) w Reflex to ID Panel     Status: None (Preliminary result)   Collection Time: 06/20/20  5:34 PM   Specimen: BLOOD  Result Value Ref Range Status   Specimen  Description BLOOD [facility]  Final   Special Requests   Final    BOTTLES DRAWN AEROBIC AND ANAEROBIC Blood Culture adequate volume   Culture   Final    NO GROWTH 3 DAYS Performed at [facility], [address] [place], [place] [address]    Report Status PENDING  Incomplete  CULTURE, BLOOD (ROUTINE X 2) w Reflex to ID Panel  Status: None (Preliminary result)   Collection Time: 06/20/20  5:34 PM   Specimen: BLOOD  Result Value Ref Range Status   Specimen Description BLOOD RAC  Final   Special Requests   Final    BOTTLES DRAWN AEROBIC AND ANAEROBIC Blood Culture adequate volume   Culture   Final    NO GROWTH 3 DAYS Performed at [facility], [address] [place],  [address]    Report Status PENDING  Incomplete    Coagulation Studies: No results for input(s): LABPROT, INR in the last 72 hours.  Urinalysis: No results for input(s): COLORURINE, LABSPEC, PHURINE, GLUCOSEU, HGBUR, BILIRUBINUR, KETONESUR, PROTEINUR, UROBILINOGEN, NITRITE, LEUKOCYTESUR in the last 72 hours.  Invalid input(s): APPERANCEUR    Imaging: No results found.   Medications:   . sodium chloride     . allopurinol  50 mg Oral Daily  . atorvastatin  20 mg Oral Daily  . ferrous sulfate  325 mg Oral Q breakfast  . gabapentin  300 mg Oral TID  . metoprolol succinate  100 mg Oral Daily  . polyethylene glycol  17 g Oral Daily  . Rivaroxaban  15 mg Oral Q supper  . senna-docusate  1 tablet Oral BID  . sodium chloride flush  3 mL Intravenous Q12H   sodium chloride, acetaminophen, ipratropium-albuterol, ondansetron (ZOFRAN) IV, sodium chloride flush  Assessment/ Plan:  [name] is a 86 y.o. black female with congestive heart failure, hypertension, atrial fibrillation, hyperlipidemia, history of lymphoma, who was admitted to [facility] on 06/16/2020 for Hypoxia [R09.02] Elevated TSH [R79.89] Acute CHF (congestive heart failure) (HCC) [I50.9] Acute on chronic congestive heart  failure, unspecified heart failure type ([place]) [I50.9]  1. Acute renal failure on chronic kidney disease stage IV with proteinuria : baseline creatinine of 1.75, GFR of 26 on 05/08/20.  Acute renal failure secondary to acute cardiorenal syndrome.  - renal function is too low to start empagliflozin  2. Hypertension: With acute exacerbation of diastolic congestive heart failure. Home regimen of torsemide 40mg  bid and metoprolol Holding diuretics today   3. Anemia with chronic kidney disease: microcytic.   4. Atrial fibrillation: rate controlled with metoprolol and anticoagulation with rivaroxaban   LOS: 4 Kristy Solomon 9/21/202112:53 PM

## 2020-06-23 NOTE — Progress Notes (Signed)
PROGRESS NOTE    ARBOR LEER  PJA:[id number] DOB: [birth year] DOA: 06/26/2020 PCP: Jodi Marble, MD   Brief Narrative:  [name] is an 86 yo AA female with PMH chronic respiratory failure (on 2L [place] O2), HFpEF, permanent afib (on Xarelto), HTN, CKDIV HLD, hx lymphoma, anemia of chronic disease who presented to the hospital with worsening SOB. She had been out of her medications at home for approx a couple weeks.  She was found to be volume overloaded and in afib with RVR. Nephrology and cardiology were consulted on admission.  A CXR was obtained which revealed bilateral pleural effusions and pulmonary edema.  She was started on IV Lasix and resumed back on her beta-blocker. She diuresed well and clinically responded to treatment with improved SOB and swelling.   Subjective: Patient was feeling little better when seen today.  No new complaint.  Breathing improved.  Continues to have lower extremity edema.  Assessment & Plan:   Principal Problem:   Acute on chronic heart failure with preserved ejection fraction (HFpEF) (HCC) Active Problems:   Waldenstrom macroglobulinemia (HCC)   Thrombocytopenia (HCC)   HTN (hypertension)   GERD (gastroesophageal reflux disease)   CKD (chronic kidney disease), stage IV (HCC)   Atrial fibrillation with rapid ventricular response (HCC)   Non compliance w medication regimen   Permanent atrial fibrillation (HCC)  Acute on chronic heart failure with preserved ejection fraction (HFpEF) ([place]).  Cardiology is following-appreciate their recommendation. Patient has a net negative of 4L with a weight change of about 8 to 10 pounds. Lasix is being held today due to increasing creatinine. If creatinine improves tomorrow she can resume her home dose of Lasix. -Continue with strict intake and output and daily weight. -PT is recommending home health services-ordered.  Atrial fibrillation with rapid ventricular response ([place]).  Most likely secondary  to being out of medications at home.  Currently rate well controlled. -Continue home dose of Toprol and Xarelto.  GERD (gastroesophageal reflux disease) - continue PPI  HTN (hypertension).  Blood pressure within goal. -Continue current regimen. -We will need to make sure that patient can afford meds at discharge.  AKI with CKD stage IV.  Most likely secondary to cardiorenal syndrome.  Baseline creatinine around 1.7 per nephrology. Nephrology is following-appreciate their recommendations.  Lasix is being held today due to increase in creatinine. -Continue to monitor. -Avoid nephrotoxins  Thrombocytopenia (HCC) - patient has mild chronic thrombocytopenia at baseline ~120-140k - etiology due to her underlying Waldenstrom's macroglobulinemia and effect from ibrutinib; follows with oncology - okay to continue xarelto for now   Waldenstrom macroglobulinemia [facility]) - follows with oncology; last OV 04/23/20 - continue ibrutinib   Objective: Vitals:   06/23/20 0409 06/23/20 0721 06/23/20 1103 06/23/20 1541  BP: 112/78 97/79 135/80 110/65  Pulse: 83 96 89 75  Resp: 18 17 18 17   Temp: 98.4 F (36.9 C) 97.9 F (36.6 C) 98.2 F (36.8 C) 98.6 F (37 C)  TempSrc: Oral Oral Oral   SpO2: 95% 94% 100% 98%  Weight: 98.6 kg     Height:        Intake/Output Summary (Last 24 hours) at 06/23/2020 1640 Last data filed at 06/23/2020 1103 Gross per 24 hour  Intake 0 ml  Output 900 ml  Net -900 ml   Filed Weights   06/21/20 0427 06/22/20 0635 06/23/20 0409  Weight: 102.4 kg 103.2 kg 98.6 kg    Examination:  General exam: Appears calm and comfortable  Respiratory  system: Decreased air entry at bases. Respiratory effort normal. Cardiovascular system: Irregularly irregular Gastrointestinal system: Soft, nontender, nondistended, bowel sounds positive. Central nervous system: Alert and oriented.  No focal neurologic deficit.   Extremities: 1+ LE edema,pulses intact and  symmetrical. Psychiatry: Judgement and insight appear normal. Mood & affect appropriate.    DVT prophylaxis: Xarelto Code Status: Full Family Communication: Called grandson with no response. Disposition Plan:  Status is: Inpatient  Remains inpatient appropriate because:Inpatient level of care appropriate due to severity of illness   Dispo: The patient is from: Home              Anticipated d/c is to: Home              Anticipated d/c date is: 1 day              Patient currently is not medically stable to d/c.  Consultants:   Cardiology  Nephrology  Procedures:  Antimicrobials:   Data Reviewed: I have personally reviewed following labs and imaging studies  CBC: Recent Labs  Lab 06/09/2020 0117 06/21/20 0405 06/22/20 0607 06/23/20 0658  WBC 4.7 5.0 5.8 6.1  NEUTROABS  --  3.0 3.8 4.1  HGB 12.4 11.3* 11.8* 12.7  HCT 41.7 37.0 39.8 41.5  MCV 75.7* 74.4* 75.7* 72.9*  PLT 98* 86* 97* 774*   Basic Metabolic Panel: Recent Labs  Lab 06/05/2020 0117 06/20/20 0636 06/21/20 0405 06/22/20 0607 06/23/20 0658  NA 145 140 141 143 142  K 4.3 4.7 4.1 3.8 4.1  CL 112* 110 109 109 107  CO2 22 22 25 26 24   GLUCOSE 111* 111* 124* 135* 102*  BUN 38* 40* 43* 43* 47*  CREATININE 2.08* 1.95* 2.05* 2.00* 2.22*  CALCIUM 8.5* 8.6* 8.2* 8.3* 8.3*  MG  --   --  2.0 2.1 2.0   GFR: Estimated Creatinine Clearance: 20 mL/min (A) (by C-G formula based on SCr of 2.22 mg/dL (H)). Liver Function Tests: No results for input(s): AST, ALT, ALKPHOS, BILITOT, PROT, ALBUMIN in the last 168 hours. No results for input(s): LIPASE, AMYLASE in the last 168 hours. No results for input(s): AMMONIA in the last 168 hours. Coagulation Profile: No results for input(s): INR, PROTIME in the last 168 hours. Cardiac Enzymes: No results for input(s): CKTOTAL, CKMB, CKMBINDEX, TROPONINI in the last 168 hours. BNP (last 3 results) No results for input(s): PROBNP in the last 8760 hours. HbA1C: No results for  input(s): HGBA1C in the last 72 hours. CBG: No results for input(s): GLUCAP in the last 168 hours. Lipid Profile: No results for input(s): CHOL, HDL, LDLCALC, TRIG, CHOLHDL, LDLDIRECT in the last 72 hours. Thyroid Function Tests: No results for input(s): TSH, T4TOTAL, FREET4, T3FREE, THYROIDAB in the last 72 hours. Anemia Panel: Recent Labs    06/21/20 0405  FERRITIN 19  TIBC 298  IRON 24*   Sepsis Labs: Recent Labs  Lab 06/22/2020 0340 06/20/20 0636 06/21/20 0405 06/22/20 0607  PROCALCITON  --  <0.10 <0.10 <0.10  LATICACIDVEN 0.9  --   --   --     Recent Results (from the past 240 hour(s))  Culture, blood (routine x 2)     Status: Abnormal   Collection Time: 06/25/2020  3:40 AM   Specimen: BLOOD  Result Value Ref Range Status   Specimen Description   Final    BLOOD LEFT [facility] Performed at [facility], [address] [place], [place] [address]    Special Requests   Final  BOTTLES DRAWN AEROBIC AND ANAEROBIC Blood Culture adequate volume Performed at [facility], [address] [place], [place] [address]    Culture  Setup Time   Final    GRAM POSITIVE COCCI AEROBIC BOTTLE ONLY CRITICAL VALUE NOTED.  VALUE IS CONSISTENT WITH PREVIOUSLY REPORTED AND CALLED VALUE. Performed at [facility], [address] [place], [place] [address]    Culture (A)  Final    STAPHYLOCOCCUS CAPITIS STAPHYLOCOCCUS EPIDERMIDIS    Report Status 06/23/2020 FINAL  Final   Organism ID, Bacteria STAPHYLOCOCCUS EPIDERMIDIS  Final      Susceptibility   Staphylococcus epidermidis - MIC*    CIPROFLOXACIN <=0.5 SENSITIVE Sensitive     ERYTHROMYCIN >=8 RESISTANT Resistant     GENTAMICIN <=0.5 SENSITIVE Sensitive     OXACILLIN <=0.25 SENSITIVE Sensitive     TETRACYCLINE <=1 SENSITIVE Sensitive     VANCOMYCIN 1 SENSITIVE Sensitive     TRIMETH/SULFA <=10 SENSITIVE Sensitive     CLINDAMYCIN <=0.25 SENSITIVE Sensitive     RIFAMPIN <=0.5 SENSITIVE Sensitive      Inducible Clindamycin NEGATIVE Sensitive     * STAPHYLOCOCCUS EPIDERMIDIS  Culture, blood (routine x 2)     Status: Abnormal   Collection Time: 06/07/2020  3:40 AM   Specimen: BLOOD  Result Value Ref Range Status   Specimen Description   Final    BLOOD RIGHT FA Performed at [facility], [address] [place], [place] [address]    Special Requests   Final    BOTTLES DRAWN AEROBIC AND ANAEROBIC Blood Culture adequate volume Performed at [facility], [address] [place], [place] [address]    Culture  Setup Time   Final    GRAM POSITIVE COCCI IN BOTH AEROBIC AND ANAEROBIC BOTTLES CRITICAL RESULT CALLED TO, READ BACK BY AND VERIFIED WITH: SUSAN WATSON AT 2426 06/17/2020.PMF    Culture (A)  Final    STAPHYLOCOCCUS HOMINIS STAPHYLOCOCCUS EPIDERMIDIS THE SIGNIFICANCE OF ISOLATING THIS ORGANISM FROM A SINGLE SET OF BLOOD CULTURES WHEN MULTIPLE SETS ARE DRAWN IS UNCERTAIN. PLEASE NOTIFY THE MICROBIOLOGY DEPARTMENT WITHIN ONE WEEK IF SPECIATION AND SENSITIVITIES ARE REQUIRED. Performed at [facility], [place] [address], [place], [place] [address]    Report Status 06/23/2020 FINAL  Final  Blood Culture ID Panel (Reflexed)     Status: Abnormal   Collection Time: 06/29/2020  3:40 AM  Result Value Ref Range Status   Enterococcus faecalis NOT DETECTED NOT DETECTED Final   Enterococcus Faecium NOT DETECTED NOT DETECTED Final   Listeria monocytogenes NOT DETECTED NOT DETECTED Final   Staphylococcus species DETECTED (A) NOT DETECTED Final    Comment: CRITICAL RESULT CALLED TO, READ BACK BY AND VERIFIED WITH: SUSAN WATSON AT 1833 06/05/2020.PMF    Staphylococcus aureus (BCID) NOT DETECTED NOT DETECTED Final   Staphylococcus epidermidis DETECTED (A) NOT DETECTED Final    Comment: Methicillin (oxacillin) resistant coagulase negative staphylococcus. Possible blood culture contaminant (unless isolated from more than one blood culture draw or clinical case suggests pathogenicity).  No antibiotic treatment is indicated for blood  culture contaminants. CRITICAL RESULT CALLED TO, READ BACK BY AND VERIFIED WITH: SUSAN WATSON AT 6222 06/30/2020.PMF    Staphylococcus lugdunensis NOT DETECTED NOT DETECTED Final   Streptococcus species NOT DETECTED NOT DETECTED Final   Streptococcus agalactiae NOT DETECTED NOT DETECTED Final   Streptococcus pneumoniae NOT DETECTED NOT DETECTED Final   Streptococcus pyogenes NOT DETECTED NOT DETECTED Final   A.calcoaceticus-baumannii NOT DETECTED NOT DETECTED Final  Bacteroides fragilis NOT DETECTED NOT DETECTED Final   Enterobacterales NOT DETECTED NOT DETECTED Final   Enterobacter cloacae complex NOT DETECTED NOT DETECTED Final   Escherichia coli NOT DETECTED NOT DETECTED Final   Klebsiella aerogenes NOT DETECTED NOT DETECTED Final   Klebsiella oxytoca NOT DETECTED NOT DETECTED Final   Klebsiella pneumoniae NOT DETECTED NOT DETECTED Final   Proteus species NOT DETECTED NOT DETECTED Final   Salmonella species NOT DETECTED NOT DETECTED Final   Serratia marcescens NOT DETECTED NOT DETECTED Final   Haemophilus influenzae NOT DETECTED NOT DETECTED Final   Neisseria meningitidis NOT DETECTED NOT DETECTED Final   Pseudomonas aeruginosa NOT DETECTED NOT DETECTED Final   Stenotrophomonas maltophilia NOT DETECTED NOT DETECTED Final   Candida albicans NOT DETECTED NOT DETECTED Final   Candida auris NOT DETECTED NOT DETECTED Final   Candida glabrata NOT DETECTED NOT DETECTED Final   Candida krusei NOT DETECTED NOT DETECTED Final   Candida parapsilosis NOT DETECTED NOT DETECTED Final   Candida tropicalis NOT DETECTED NOT DETECTED Final   Cryptococcus neoformans/gattii NOT DETECTED NOT DETECTED Final   Methicillin resistance mecA/C DETECTED (A) NOT DETECTED Final    Comment: CRITICAL RESULT CALLED TO, READ BACK BY AND VERIFIED WITH: SUSAN WATSON AT 1833 07/02/2020.PMF Performed at [facility], [address] [place], [place] [address]    SARS Coronavirus 2 by RT PCR (hospital order, performed in [facility] hospital lab) Nasopharyngeal Nasopharyngeal Swab     Status: None   Collection Time: 06/22/2020 12:01 PM   Specimen: Nasopharyngeal Swab  Result Value Ref Range Status   SARS Coronavirus 2 NEGATIVE NEGATIVE Final    Comment: (NOTE) SARS-CoV-2 target nucleic acids are NOT DETECTED.  The SARS-CoV-2 RNA is generally detectable in upper and lower respiratory specimens during the acute phase of infection. The lowest concentration of SARS-CoV-2 viral copies this assay can detect is 250 copies / mL. A negative result does not preclude SARS-CoV-2 infection and should not be used as the sole basis for treatment or other patient management decisions.  A negative result may occur with improper specimen collection / handling, submission of specimen other than nasopharyngeal swab, presence of viral mutation(s) within the areas targeted by this assay, and inadequate number of viral copies (<250 copies / mL). A negative result must be combined with clinical observations, patient history, and epidemiological information.  Fact Sheet for Patients:   StrictlyIdeas.no  Fact Sheet for Healthcare Providers: BankingDealers.co.za  This test is not yet approved or  cleared by the [place] FDA and has been authorized for detection and/or diagnosis of SARS-CoV-2 by FDA under an Emergency Use Authorization (EUA).  This EUA will remain in effect (meaning this test can be used) for the duration of the COVID-19 declaration under Section 564(b)(1) of the Act, 21 U.S.C. section 360bbb-3(b)(1), unless the authorization is terminated or revoked sooner.  Performed at [facility], [address] [place], [place] [address]   CULTURE, BLOOD (ROUTINE X 2) w Reflex to ID Panel     Status: None (Preliminary result)   Collection Time: 06/20/20  5:34 PM   Specimen: BLOOD  Result Value  Ref Range Status   Specimen Description BLOOD [facility]  Final   Special Requests   Final    BOTTLES DRAWN AEROBIC AND ANAEROBIC Blood Culture adequate volume   Culture   Final    NO GROWTH 3 DAYS Performed at [facility], [address] [place], [place] [address]    Report Status  PENDING  Incomplete  CULTURE, BLOOD (ROUTINE X 2) w Reflex to ID Panel     Status: None (Preliminary result)   Collection Time: 06/20/20  5:34 PM   Specimen: BLOOD  Result Value Ref Range Status   Specimen Description BLOOD RAC  Final   Special Requests   Final    BOTTLES DRAWN AEROBIC AND ANAEROBIC Blood Culture adequate volume   Culture   Final    NO GROWTH 3 DAYS Performed at [facility], [address], [place], [place] [address]    Report Status PENDING  Incomplete     Radiology Studies: No results found.  Scheduled Meds: . allopurinol  50 mg Oral Daily  . atorvastatin  20 mg Oral Daily  . ferrous sulfate  325 mg Oral Q breakfast  . gabapentin  300 mg Oral TID  . metoprolol succinate  100 mg Oral Daily  . polyethylene glycol  17 g Oral Daily  . Rivaroxaban  15 mg Oral Q supper  . senna-docusate  1 tablet Oral BID  . sodium chloride flush  3 mL Intravenous Q12H   Continuous Infusions: . sodium chloride       LOS: 4 days   Time spent: 35 minutes.  Lorella Nimrod, MD [facility]  If 7PM-7AM, please contact night-coverage [email or web address]  06/23/2020, 4:40 PM   This record has been created using Systems analyst. Errors have been sought and corrected,but may not always be located. Such creation errors do not reflect on the standard of care.

## 2020-06-24 ENCOUNTER — Telehealth: Payer: Self-pay | Admitting: Cardiology

## 2020-06-24 LAB — BASIC METABOLIC PANEL
Anion gap: 8 (ref 5–15)
BUN: 51 mg/dL — ABNORMAL HIGH (ref 8–23)
CO2: 27 mmol/L (ref 22–32)
Calcium: 8.1 mg/dL — ABNORMAL LOW (ref 8.9–10.3)
Chloride: 107 mmol/L (ref 98–111)
Creatinine, Ser: 2.25 mg/dL — ABNORMAL HIGH (ref 0.44–1.00)
GFR calc Af Amer: 22 mL/min — ABNORMAL LOW (ref >60)
GFR calc non Af Amer: 19 mL/min — ABNORMAL LOW (ref >60)
Glucose, Bld: 97 mg/dL (ref 70–99)
Potassium: 3.9 mmol/L (ref 3.5–5.1)
Sodium: 142 mmol/L (ref 135–145)

## 2020-06-24 LAB — CBC WITH DIFFERENTIAL/PLATELET
Abs Immature Granulocytes: 0.02 10*3/uL (ref 0.00–0.07)
Basophils Absolute: 0.1 10*3/uL (ref 0.0–0.1)
Basophils Relative: 1 %
Eosinophils Absolute: 0.1 10*3/uL (ref 0.0–0.5)
Eosinophils Relative: 2 %
HCT: 39.3 % (ref 36.0–46.0)
Hemoglobin: 11.6 g/dL — ABNORMAL LOW (ref 12.0–15.0)
Immature Granulocytes: 0 %
Lymphocytes Relative: 17 %
Lymphs Abs: 1.1 10*3/uL (ref 0.7–4.0)
MCH: 22.2 pg — ABNORMAL LOW (ref 26.0–34.0)
MCHC: 29.5 g/dL — ABNORMAL LOW (ref 30.0–36.0)
MCV: 75.1 fL — ABNORMAL LOW (ref 80.0–100.0)
Monocytes Absolute: 0.7 10*3/uL (ref 0.1–1.0)
Monocytes Relative: 10 %
Neutro Abs: 4.4 10*3/uL (ref 1.7–7.7)
Neutrophils Relative %: 70 %
Platelets: 108 10*3/uL — ABNORMAL LOW (ref 150–400)
RBC: 5.23 MIL/uL — ABNORMAL HIGH (ref 3.87–5.11)
RDW: 19.6 % — ABNORMAL HIGH (ref 11.5–15.5)
WBC: 6.3 10*3/uL (ref 4.0–10.5)
nRBC: 0 % (ref 0.0–0.2)

## 2020-06-24 LAB — MAGNESIUM: Magnesium: 2.1 mg/dL (ref 1.7–2.4)

## 2020-06-24 LAB — BLOOD GAS, ARTERIAL
Acid-Base Excess: 0.8 mmol/L (ref 0.0–2.0)
Allens test (pass/fail): POSITIVE — AB
Bicarbonate: 26 mmol/L (ref 20.0–28.0)
FIO2: 0.28
O2 Saturation: 98.3 %
Patient temperature: 37
pCO2 arterial: 43 mmHg (ref 32.0–48.0)
pH, Arterial: 7.39 (ref 7.350–7.450)
pO2, Arterial: 111 mmHg — ABNORMAL HIGH (ref 83.0–108.0)

## 2020-06-24 LAB — ALBUMIN: Albumin: 2.4 g/dL — ABNORMAL LOW (ref 3.5–5.0)

## 2020-06-24 MED ORDER — MIDODRINE HCL 5 MG PO TABS
5.0000 mg | ORAL_TABLET | Freq: Once | ORAL | Status: AC
  Administered 2020-06-24: 5 mg via ORAL
  Filled 2020-06-24: qty 1

## 2020-06-24 MED ORDER — SODIUM CHLORIDE 0.9 % IV BOLUS
250.0000 mL | Freq: Once | INTRAVENOUS | Status: AC
  Administered 2020-06-24: 250 mL via INTRAVENOUS

## 2020-06-24 MED ORDER — TORSEMIDE 20 MG PO TABS
40.0000 mg | ORAL_TABLET | Freq: Every day | ORAL | Status: DC
  Administered 2020-06-25 – 2020-06-27 (×3): 40 mg via ORAL
  Filled 2020-06-24 (×3): qty 2

## 2020-06-24 MED ORDER — MIDODRINE HCL 5 MG PO TABS
5.0000 mg | ORAL_TABLET | Freq: Three times a day (TID) | ORAL | Status: DC
  Administered 2020-06-24: 5 mg via ORAL
  Filled 2020-06-24: qty 1

## 2020-06-24 MED ORDER — MIDODRINE HCL 5 MG PO TABS
10.0000 mg | ORAL_TABLET | Freq: Three times a day (TID) | ORAL | Status: DC
  Administered 2020-06-24: 10 mg via ORAL
  Filled 2020-06-24 (×2): qty 2

## 2020-06-24 MED ORDER — ALBUMIN HUMAN 25 % IV SOLN
25.0000 g | Freq: Four times a day (QID) | INTRAVENOUS | Status: AC
  Administered 2020-06-24 – 2020-06-25 (×3): 25 g via INTRAVENOUS
  Filled 2020-06-24 (×3): qty 100

## 2020-06-24 MED ORDER — METOPROLOL SUCCINATE ER 50 MG PO TB24
50.0000 mg | ORAL_TABLET | Freq: Two times a day (BID) | ORAL | Status: DC
  Administered 2020-06-24 – 2020-06-26 (×4): 50 mg via ORAL
  Filled 2020-06-24 (×4): qty 1

## 2020-06-24 NOTE — TOC Transition Note (Signed)
Transition of Care [name]) - CM/SW Discharge Note   Patient Details  Name: Kristy Solomon MRN: [id number] Date of Birth: [birth year]  Transition of [name] CM/SW Contact:  Victorino Dike, RN Phone Number: 06/24/2020, 12:54 PM       Final next level of care: [name] Barriers to Discharge: Barriers Resolved   Patient Goals and CMS Choice Patient states their goals for this hospitalization and ongoing recovery are:: to return home CMS Medicare.gov Compare Post Acute Care list provided to:: Patient Choice offered to / list presented to : Patient, Adult Children  Discharge Placement                       Discharge Plan and Services                  DME Agency: [facility]       HH Arranged: RN, PT, OT, Nurse's Aide [facility] Agency: [facility] Date [facility] Agency Contacted: 06/24/20 Time [name]: [phone] Representative spoke with at [place]: [place] ([place]) Interventions     Readmission Risk Interventions Readmission Risk Prevention Plan 06/21/2020  Transportation Screening Complete  PCP or Specialist Appt within 3-5 Days Complete  HRI or [place] Complete  Social Work Consult for [name] Planning/Counseling Complete  Palliative Care Screening Complete  Medication Review Press photographer) Complete  Some recent data might be hidden

## 2020-06-24 NOTE — Progress Notes (Signed)
PROGRESS NOTE    Kristy Solomon  ZTI:[id number] DOB: [birth year] DOA: 06/26/2020 PCP: Jodi Marble, MD   Brief Narrative:  Kristy Solomon is an 86 yo AA female with PMH chronic respiratory failure (on 2L [place] O2), HFpEF, permanent afib (on Xarelto), HTN, CKDIV HLD, hx lymphoma, anemia of chronic disease who presented to the hospital with worsening SOB. She had been out of her medications at home for approx a couple weeks.  She was found to be volume overloaded and in afib with RVR. Nephrology and cardiology were consulted on admission.  A CXR was obtained which revealed bilateral pleural effusions and pulmonary edema.  She was started on IV Lasix and resumed back on her beta-blocker. She diuresed well and clinically responded to treatment with improved SOB and swelling.   Subjective: Patient was little somnolent when seen today.  Easily arousable and able to answer all questions appropriately, stating that she just want to take a nap.  Later paged by nursing staff that patient is hypotensive and appears more lethargic.  She was seen and examined again at bedside.  Patient appears lethargic but able to answer most of orientation questions.  Assessment & Plan:   Principal Problem:   Acute on chronic heart failure with preserved ejection fraction (HFpEF) (HCC) Active Problems:   Waldenstrom macroglobulinemia (HCC)   Thrombocytopenia (HCC)   HTN (hypertension)   GERD (gastroesophageal reflux disease)   CKD (chronic kidney disease), stage IV (HCC)   Atrial fibrillation with rapid ventricular response (HCC)   Non compliance w medication regimen   Permanent atrial fibrillation (HCC)  Acute on chronic heart failure with preserved ejection fraction (HFpEF) ([place]).  Cardiology is following-appreciate their recommendation. Patient has a net negative of 4L with a weight change of about 8 to 10 pounds. Lasix is being held  due to increasing creatinine. If creatinine improves tomorrow she can  resume her home dose of Lasix. -Continue with strict intake and output and daily weight. -PT is recommending home health services-ordered.  Hypotension.  Patient developed some hypotension and appears more lethargic than her usual.  There was a huge difference between blood pressure measured on right and left upper extremities.  Right systolic mostly in the 82N with left systolic in 05L and 97Q.  No prior diagnosis of coarctation or subclavian stenosis. -She was started on midodrine 10 mg 3 times a day by cardiology. -Continue to monitor. -Can do very small boluses of 250 cc if needed. -If persistent hypotension then she will get benefit by transferring to stepdown and starting her on pressors.  Atrial fibrillation with rapid ventricular response ([place]).  Most likely secondary to being out of medications at home.  Currently rate well controlled. -Continue home dose of Toprol and Xarelto.  GERD (gastroesophageal reflux disease) - continue PPI  HTN (hypertension).  Blood pressure little soft today. -Holding Lasix and metoprolol and she was started on midodrine. -We will need to make sure that patient can afford meds at discharge.  AKI with CKD stage IV.  Most likely secondary to cardiorenal syndrome.  Baseline creatinine around 1.7 per nephrology. Nephrology is following-appreciate their recommendations.  Lasix is being held today due to increase in creatinine, currently seems stable. -Continue to monitor. -Avoid nephrotoxins  Thrombocytopenia ([place]) - patient has mild chronic thrombocytopenia at baseline ~120-140k - etiology due to her underlying Waldenstrom's macroglobulinemia and effect from ibrutinib; follows with oncology - okay to continue xarelto for now   Waldenstrom macroglobulinemia [facility]) - follows with oncology; last  OV 04/23/20 - continue ibrutinib   Objective: Vitals:   06/24/20 1130 06/24/20 1152 06/24/20 1328 06/24/20 1400  BP: (!) 72/62 97/60 (!) 67/48 129/72   Pulse: 98 (!) 102 (!) 103 91  Resp: 18 18    Temp: (!) 97.5 F (36.4 C) 97.6 F (36.4 C)  97.8 F (36.6 C)  TempSrc: Oral Oral  Oral  SpO2: 91% 100% 95% 95%  Weight:      Height:        Intake/Output Summary (Last 24 hours) at 06/24/2020 1524 Last data filed at 06/24/2020 1433 Gross per 24 hour  Intake 120 ml  Output 300 ml  Net -180 ml   Filed Weights   06/22/20 0635 06/23/20 0409 06/24/20 0407  Weight: 103.2 kg 98.6 kg 99.4 kg    Examination:  General.  Well-developed elderly lady, appears lethargic, in no acute distress. Pulmonary.  Lungs clear bilaterally, normal respiratory effort. CV.  Regular rate and rhythm, no JVD, rub or murmur. Abdomen.  Soft, nontender, nondistended, BS positive. CNS.  Alert and oriented x3.  No focal neurologic deficit. Extremities.  2+ LE edema, no cyanosis, pulses intact and symmetrical. Psychiatry.  Judgment and insight appears normal.  DVT prophylaxis: Xarelto Code Status: Full Family Communication: Called grandson with no response. Disposition Plan:  Status is: Inpatient  Remains inpatient appropriate because:Inpatient level of care appropriate due to severity of illness   Dispo: The patient is from: Home              Anticipated d/c is to: Home              Anticipated d/c date is: 1 day              Patient currently is not medically stable to d/c.  Patient developed some hypotension today.  Starting her on midodrine by cardiology.  Holding diuretics.  Will need 1 to 2 days to resume home diuretics before discharge.  Consultants:   Cardiology  Nephrology  Procedures:  Antimicrobials:   Data Reviewed: I have personally reviewed following labs and imaging studies  CBC: Recent Labs  Lab 06/10/2020 0117 06/21/20 0405 06/22/20 0607 06/23/20 0658 06/24/20 0700  WBC 4.7 5.0 5.8 6.1 6.3  NEUTROABS  --  3.0 3.8 4.1 4.4  HGB 12.4 11.3* 11.8* 12.7 11.6*  HCT 41.7 37.0 39.8 41.5 39.3  MCV 75.7* 74.4* 75.7* 72.9* 75.1*  PLT  98* 86* 97* 106* 573*   Basic Metabolic Panel: Recent Labs  Lab 06/20/20 0636 06/21/20 0405 06/22/20 0607 06/23/20 0658 06/24/20 0700  NA 140 141 143 142 142  K 4.7 4.1 3.8 4.1 3.9  CL 110 109 109 107 107  CO2 22 25 26 24 27   GLUCOSE 111* 124* 135* 102* 97  BUN 40* 43* 43* 47* 51*  CREATININE 1.95* 2.05* 2.00* 2.22* 2.25*  CALCIUM 8.6* 8.2* 8.3* 8.3* 8.1*  MG  --  2.0 2.1 2.0 2.1   GFR: Estimated Creatinine Clearance: 19.8 mL/min (A) (by C-G formula based on SCr of 2.25 mg/dL (H)). Liver Function Tests: Recent Labs  Lab 06/24/20 0700  ALBUMIN 2.4*   No results for input(s): LIPASE, AMYLASE in the last 168 hours. No results for input(s): AMMONIA in the last 168 hours. Coagulation Profile: No results for input(s): INR, PROTIME in the last 168 hours. Cardiac Enzymes: No results for input(s): CKTOTAL, CKMB, CKMBINDEX, TROPONINI in the last 168 hours. BNP (last 3 results) No results for input(s): PROBNP in the last 8760 hours.  HbA1C: No results for input(s): HGBA1C in the last 72 hours. CBG: No results for input(s): GLUCAP in the last 168 hours. Lipid Profile: No results for input(s): CHOL, HDL, LDLCALC, TRIG, CHOLHDL, LDLDIRECT in the last 72 hours. Thyroid Function Tests: No results for input(s): TSH, T4TOTAL, FREET4, T3FREE, THYROIDAB in the last 72 hours. Anemia Panel: No results for input(s): VITAMINB12, FOLATE, FERRITIN, TIBC, IRON, RETICCTPCT in the last 72 hours. Sepsis Labs: Recent Labs  Lab 06/20/2020 0340 06/20/20 0636 06/21/20 0405 06/22/20 0607  PROCALCITON  --  <0.10 <0.10 <0.10  LATICACIDVEN 0.9  --   --   --     Recent Results (from the past 240 hour(s))  Culture, blood (routine x 2)     Status: Abnormal   Collection Time: 07/01/2020  3:40 AM   Specimen: BLOOD  Result Value Ref Range Status   Specimen Description   Final    BLOOD LEFT [facility] Performed at [facility], [address], [place], [place] [address]    Special Requests   Final      BOTTLES DRAWN AEROBIC AND ANAEROBIC Blood Culture adequate volume Performed at [facility], [address], [place], [place] [address]    Culture  Setup Time   Final    GRAM POSITIVE COCCI AEROBIC BOTTLE ONLY CRITICAL VALUE NOTED.  VALUE IS CONSISTENT WITH PREVIOUSLY REPORTED AND CALLED VALUE. Performed at [facility], [address] [place], [place] [address]    Culture (A)  Final    STAPHYLOCOCCUS CAPITIS STAPHYLOCOCCUS EPIDERMIDIS    Report Status 06/23/2020 FINAL  Final   Organism ID, Bacteria STAPHYLOCOCCUS EPIDERMIDIS  Final      Susceptibility   Staphylococcus epidermidis - MIC*    CIPROFLOXACIN <=0.5 SENSITIVE Sensitive     ERYTHROMYCIN >=8 RESISTANT Resistant     GENTAMICIN <=0.5 SENSITIVE Sensitive     OXACILLIN <=0.25 SENSITIVE Sensitive     TETRACYCLINE <=1 SENSITIVE Sensitive     VANCOMYCIN 1 SENSITIVE Sensitive     TRIMETH/SULFA <=10 SENSITIVE Sensitive     CLINDAMYCIN <=0.25 SENSITIVE Sensitive     RIFAMPIN <=0.5 SENSITIVE Sensitive     Inducible Clindamycin NEGATIVE Sensitive     * STAPHYLOCOCCUS EPIDERMIDIS  Culture, blood (routine x 2)     Status: Abnormal   Collection Time: 06/18/2020  3:40 AM   Specimen: BLOOD  Result Value Ref Range Status   Specimen Description   Final    BLOOD RIGHT FA Performed at [facility], [address], [place], [place] [address]    Special Requests   Final    BOTTLES DRAWN AEROBIC AND ANAEROBIC Blood Culture adequate volume Performed at [facility], [address] [place], [place] [address]    Culture  Setup Time   Final    GRAM POSITIVE COCCI IN BOTH AEROBIC AND ANAEROBIC BOTTLES CRITICAL RESULT CALLED TO, READ BACK BY AND VERIFIED WITH: SUSAN WATSON AT 4888 06/18/2020.PMF    Culture (A)  Final    STAPHYLOCOCCUS HOMINIS STAPHYLOCOCCUS EPIDERMIDIS THE SIGNIFICANCE OF ISOLATING THIS ORGANISM FROM A SINGLE SET OF BLOOD CULTURES WHEN MULTIPLE SETS ARE DRAWN IS UNCERTAIN. PLEASE  NOTIFY THE MICROBIOLOGY DEPARTMENT WITHIN ONE WEEK IF SPECIATION AND SENSITIVITIES ARE REQUIRED. Performed at [facility], [place] [address] [place],  [address]    Report Status 06/23/2020 FINAL  Final  Blood Culture ID Panel (Reflexed)     Status: Abnormal   Collection Time: 06/05/2020  3:40 AM  Result Value Ref Range  Status   Enterococcus faecalis NOT DETECTED NOT DETECTED Final   Enterococcus Faecium NOT DETECTED NOT DETECTED Final   Listeria monocytogenes NOT DETECTED NOT DETECTED Final   Staphylococcus species DETECTED (A) NOT DETECTED Final    Comment: CRITICAL RESULT CALLED TO, READ BACK BY AND VERIFIED WITH: SUSAN WATSON AT 1833 06/05/2020.PMF    Staphylococcus aureus (BCID) NOT DETECTED NOT DETECTED Final   Staphylococcus epidermidis DETECTED (A) NOT DETECTED Final    Comment: Methicillin (oxacillin) resistant coagulase negative staphylococcus. Possible blood culture contaminant (unless isolated from more than one blood culture draw or clinical case suggests pathogenicity). No antibiotic treatment is indicated for blood  culture contaminants. CRITICAL RESULT CALLED TO, READ BACK BY AND VERIFIED WITH: SUSAN WATSON AT 0814 06/04/2020.PMF    Staphylococcus lugdunensis NOT DETECTED NOT DETECTED Final   Streptococcus species NOT DETECTED NOT DETECTED Final   Streptococcus agalactiae NOT DETECTED NOT DETECTED Final   Streptococcus pneumoniae NOT DETECTED NOT DETECTED Final   Streptococcus pyogenes NOT DETECTED NOT DETECTED Final   A.calcoaceticus-baumannii NOT DETECTED NOT DETECTED Final   Bacteroides fragilis NOT DETECTED NOT DETECTED Final   Enterobacterales NOT DETECTED NOT DETECTED Final   Enterobacter cloacae complex NOT DETECTED NOT DETECTED Final   Escherichia coli NOT DETECTED NOT DETECTED Final   Klebsiella aerogenes NOT DETECTED NOT DETECTED Final   Klebsiella oxytoca NOT DETECTED NOT DETECTED Final   Klebsiella pneumoniae NOT DETECTED NOT DETECTED Final   Proteus  species NOT DETECTED NOT DETECTED Final   Salmonella species NOT DETECTED NOT DETECTED Final   Serratia marcescens NOT DETECTED NOT DETECTED Final   Haemophilus influenzae NOT DETECTED NOT DETECTED Final   Neisseria meningitidis NOT DETECTED NOT DETECTED Final   Pseudomonas aeruginosa NOT DETECTED NOT DETECTED Final   Stenotrophomonas maltophilia NOT DETECTED NOT DETECTED Final   Candida albicans NOT DETECTED NOT DETECTED Final   Candida auris NOT DETECTED NOT DETECTED Final   Candida glabrata NOT DETECTED NOT DETECTED Final   Candida krusei NOT DETECTED NOT DETECTED Final   Candida parapsilosis NOT DETECTED NOT DETECTED Final   Candida tropicalis NOT DETECTED NOT DETECTED Final   Cryptococcus neoformans/gattii NOT DETECTED NOT DETECTED Final   Methicillin resistance mecA/C DETECTED (A) NOT DETECTED Final    Comment: CRITICAL RESULT CALLED TO, READ BACK BY AND VERIFIED WITH: SUSAN WATSON AT 1833 06/06/2020.PMF Performed at [facility], [address] [place], [place] [address]   SARS Coronavirus 2 by RT PCR (hospital order, performed in [facility] hospital lab) Nasopharyngeal Nasopharyngeal Swab     Status: None   Collection Time: 06/18/2020 12:01 PM   Specimen: Nasopharyngeal Swab  Result Value Ref Range Status   SARS Coronavirus 2 NEGATIVE NEGATIVE Final    Comment: (NOTE) SARS-CoV-2 target nucleic acids are NOT DETECTED.  The SARS-CoV-2 RNA is generally detectable in upper and lower respiratory specimens during the acute phase of infection. The lowest concentration of SARS-CoV-2 viral copies this assay can detect is 250 copies / mL. A negative result does not preclude SARS-CoV-2 infection and should not be used as the sole basis for treatment or other patient management decisions.  A negative result may occur with improper specimen collection / handling, submission of specimen other than nasopharyngeal swab, presence of viral mutation(s) within the areas targeted by this  assay, and inadequate number of viral copies (<250 copies / mL). A negative result must be combined with clinical observations, patient history, and epidemiological information.  Fact Sheet for Patients:   StrictlyIdeas.no  Fact Sheet for Healthcare Providers: BankingDealers.co.za  This test is not yet approved or  cleared by the [place] FDA and has been authorized for detection and/or diagnosis of SARS-CoV-2 by FDA under an Emergency Use Authorization (EUA).  This EUA will remain in effect (meaning this test can be used) for the duration of the COVID-19 declaration under Section 564(b)(1) of the Act, 21 U.S.C. section 360bbb-3(b)(1), unless the authorization is terminated or revoked sooner.  Performed at [facility], [address] [place], [place] [address]   CULTURE, BLOOD (ROUTINE X 2) w Reflex to ID Panel     Status: None (Preliminary result)   Collection Time: 06/20/20  5:34 PM   Specimen: BLOOD  Result Value Ref Range Status   Specimen Description BLOOD [facility]  Final   Special Requests   Final    BOTTLES DRAWN AEROBIC AND ANAEROBIC Blood Culture adequate volume   Culture   Final    NO GROWTH 4 DAYS Performed at [facility], [address], [place], [place] [address]    Report Status PENDING  Incomplete  CULTURE, BLOOD (ROUTINE X 2) w Reflex to ID Panel     Status: None (Preliminary result)   Collection Time: 06/20/20  5:34 PM   Specimen: BLOOD  Result Value Ref Range Status   Specimen Description BLOOD RAC  Final   Special Requests   Final    BOTTLES DRAWN AEROBIC AND ANAEROBIC Blood Culture adequate volume   Culture   Final    NO GROWTH 4 DAYS Performed at [facility], [address] [place], [place] [address]    Report Status PENDING  Incomplete     Radiology Studies: No results found.  Scheduled Meds: . allopurinol  50 mg Oral Daily  . atorvastatin  20 mg Oral Daily  .  ferrous sulfate  325 mg Oral Q breakfast  . gabapentin  300 mg Oral TID  . metoprolol succinate  50 mg Oral BID  . midodrine  10 mg Oral TID WC  . polyethylene glycol  17 g Oral Daily  . Rivaroxaban  15 mg Oral Q supper  . senna-docusate  1 tablet Oral BID  . sodium chloride flush  3 mL Intravenous Q12H  . torsemide  40 mg Oral Daily   Continuous Infusions: . sodium chloride    . albumin human       LOS: 5 days   Time spent: 35 minutes.  Lorella Nimrod, MD [facility]  If 7PM-7AM, please contact night-coverage [email or web address]  06/24/2020, 3:24 PM   This record has been created using Systems analyst. Errors have been sought and corrected,but may not always be located. Such creation errors do not reflect on the standard of care.

## 2020-06-24 NOTE — Progress Notes (Signed)
Physical Therapy Treatment Patient Details Name: Kristy Solomon MRN: [id number] DOB: 1929/09/04 Today's Date: 06/24/2020    History of Present Illness Kristy Solomon is an 86 y/o female who was admitted for worsening SOB with an increase in O2 to 3L due to symptoms. In ED, pt was tachycardic and in rapid A-fib w/ HR to 170. PMH includes chronic respiratory failure on 2L O2 continuous, chrinic diastolic dysfunction, A-fib on chronic anticoagulation therapy, HTN with complications of CKD III-IV, covid-19 virus infection April 2021, and history of malignant lymphoma.    PT Comments    Pt agreeable to PT session this afternoon and was received lying in bed. Pt initially reporting that she was having a BM. Bed mobility performed with mod A for rolling side to side for lower body management however no soiling was noted. Donned brief for OOB activities secondary to urinary frequency/incontinence. Flattened HOB for supine to sit to simulate home environment. CGA for BLE advancement to and over edge of bed and for trunk elevation to some into sitting. Pt required max verbal cues for sequencing of task and increased time to perform. Pt then performed sit to stand with CGA and ambulated 30 feet using RW with CGA for safety and O2 line management. Pt with decreased gait pattern however no overt LOB was noted throughout distance. Pt returned to recliner chair and performed sit <> stands with SBA for safety. Pt able to perform 2 of 15 reps without BUE assist on armrests then fatigued. Pt on 2L O2 throughout session and O2 sats remained within 98-100%. BP was monitored throughout session and remained WNL. Pt making good progress towards meeting acute care goals. Will continue to follow and progress as tolerated.     Follow Up Recommendations  Home health PT     Equipment Recommendations  None recommended by PT    Recommendations for Other Services       Precautions / Restrictions  Precautions Precautions: Fall Restrictions Weight Bearing Restrictions: No    Mobility  Bed Mobility Overal bed mobility: Needs Assistance Bed Mobility: Rolling;Supine to Sit Rolling: Mod assist   Supine to sit: Min guard     General bed mobility comments: Mod A for rolling side to side to don brief for OOB activities; pt able to actively reach with BUE to opposite bedrail however mod A required for BLE placement and rolling hips into sidelying position. HOB lowered to mimic home setup and pt performed supine to sit with increased time and CGA for BLE management over EOB and for trunk elevation into sitting.  Transfers Overall transfer level: Needs assistance Equipment used: Rolling walker (2 wheeled) Transfers: Sit to/from Stand Sit to Stand: Min guard;Supervision         General transfer comment: CGA for initial sit <> stand then pt able to progress to SBA for safety. Heavy verbal cues for hand and foot placement during transfer.  Ambulation/Gait Ambulation/Gait assistance: Min guard Gait Distance (Feet): 30 Feet Assistive device: Rolling walker (2 wheeled) Gait Pattern/deviations: Step-through pattern;Trunk flexed Gait velocity: decreased   General Gait Details: Pt ambulated 30 feet with decreased gait speed and slightly flexed trunk. Increased verbal cues to remain inside confines of RW for safe usage.   Stairs             Wheelchair Mobility    Modified Rankin (Stroke Patients Only)       Balance Overall balance assessment: Needs assistance Sitting-balance support: Feet supported Sitting balance-Leahy Scale: Good Sitting balance -  Comments: no overt LOB noted   Standing balance support: Bilateral upper extremity supported Standing balance-Leahy Scale: Good Standing balance comment: no overt LOB noted with static standign and BUE supported on armrests of RW                            Cognition Arousal/Alertness: Awake/alert Behavior  During Therapy: WFL for tasks assessed/performed;Flat affect Overall Cognitive Status: Within Functional Limits for tasks assessed                                        Exercises Other Exercises Other Exercises: sit <> stands x 13 with BUE assist pushing on armrests of chair; SBA for safety; pt then performed 2 reps without BUE assistance prior to fatigue    General Comments        Pertinent Vitals/Pain Pain Assessment: No/denies pain    Home Living                      Prior Function            PT Goals (current goals can now be found in the care plan section) Acute Rehab PT Goals Patient Stated Goal: none stated PT Goal Formulation: With patient Time For Goal Achievement: 07/15/2020 Potential to Achieve Goals: Good Progress towards PT goals: Progressing toward goals    Frequency    Min 2X/week      PT Plan Current plan remains appropriate    Co-evaluation              AM-PAC PT "6 Clicks" Mobility   Outcome Measure  Help needed turning from your back to your side while in a flat bed without using bedrails?: A Lot Help needed moving from lying on your back to sitting on the side of a flat bed without using bedrails?: A Little Help needed moving to and from a bed to a chair (including a wheelchair)?: A Little Help needed standing up from a chair using your arms (e.g., wheelchair or bedside chair)?: A Little Help needed to walk in hospital room?: A Little Help needed climbing 3-5 steps with a railing? : A Lot 6 Click Score: 16    End of Session Equipment Utilized During Treatment: Gait belt;Oxygen;Other (comment) (2OL O2 via nasal cannula) Activity Tolerance: Patient tolerated treatment well Patient left: in chair;with call bell/phone within reach;with chair alarm set Nurse Communication: Mobility status PT Visit Diagnosis: Unsteadiness on feet (R26.81);Other abnormalities of gait and mobility (R26.89);Muscle weakness  (generalized) (M62.81)     Time: 3546-5681 PT Time Calculation (min) (ACUTE ONLY): 41 min  Charges:                        Vale Haven, SPT   Vale Haven 06/24/2020, 4:45 PM

## 2020-06-24 NOTE — Progress Notes (Addendum)
  Heart Failure Nurse Navigator Note  HFpEF 50-55%  She presented from home with lower extremity edema and afib with RVR.  Requiring more oxygen than what she uses at home.  She had been several days without her mediations.  Comorbidities:  Permanent A fib CKD stage 4 Hypertension Anemia Hyperlipidemia  Medications:  Lasix on hold Metoprolol succinate 100 mg daily Lipitor 40 mg daily Xarelto 15 mg with supper  Labs:  Sodium 143, potassium 3.9, BUN 51 (was 47 yesterday)  Creatinine 2.25 (2,.24 yesterday)  Weight-99.4 (yesterday 98.6)  Intake not documented Output 300 ml  Assessment:  She is lying in bed with her eyes closed, does respond when name called and opens eyes.  She denies any chest pain or increasing SOB.  HEENT-pupils react equally.  Cardiac- heart tones irregular  Respiratory- breath sounds diminished bases.  Abdomen-stomach none tender  Musculoskeletal-lower extremity edema 1+.  Spoke with patient about being compliant with her medications.  She mentioned if the bottles were labeled as why she was taking them she thought that would help.    She states at home she drinks about 2 quarts of liquid a day.  Instructed not to go over that amount.  Also asked patient if she were discharged today, could she take care of herself at home -as she lives alone and daughter checks on her, she states she is having problems with mobility and is concerned  when she goes she will not be able to get around. Spoke with patients nurse Danae Chen concerning above.   Pricilla Riffle RN,CHFN

## 2020-06-24 NOTE — Progress Notes (Signed)
Mobility Specialist - Progress Note   06/24/20 1745  Mobility  Range of Motion/Exercises Right arm;Left arm (Washed face, brushed teeth)  Level of Assistance Independent after set-up  Assistive Device None  Distance Ambulated (ft) 0 ft  Mobility Response Tolerated well  Mobility performed by Mobility specialist  $Mobility charge 1 Mobility    Pre-mobility: 98 HR, 130/63 BP, 98% SpO2   Pt was sitting in recliner upon arrival. Pt agreed to session. Pt declined getting back to bed, but agreed to hygiene tasks. Pt was able to wash her face and brush teeth independently with set up assist from mobility tech. Overall, pt tolerated session well. Pt was left in recliner with all needs in reach.     Kathee Delton Mobility Specialist 06/24/20, 5:47 PM

## 2020-06-24 NOTE — Telephone Encounter (Signed)
-----   Message from Rise Mu, PA-C sent at 06/24/2020  8:20 AM EDT ----- Can you please add her to my schedule to be seen 9/29 for TCM? Thanks!

## 2020-06-24 NOTE — Progress Notes (Signed)
   06/24/20 1152  Assess: MEWS Score  Temp 97.6 F (36.4 C)  BP 97/60  Pulse Rate (!) 102  Resp 18  SpO2 100 %  O2 Device Nasal Cannula  O2 Flow Rate (L/min) 3 L/min  Assess: MEWS Score  MEWS Temp 0  MEWS Systolic 1  MEWS Pulse 1  MEWS RR 0  MEWS LOC 0  MEWS Score 2  MEWS Score Color Yellow  Assess: if the MEWS score is Yellow or Red  Were vital signs taken at a resting state? Yes  Focused Assessment No change from prior assessment  Early Detection of Sepsis Score *See Row Information* Low  MEWS guidelines implemented *See Row Information* No, vital signs rechecked  Treat  MEWS Interventions Other (Comment);Escalated (See documentation below)  Escalate  MEWS: Escalate Yellow: discuss with charge nurse/RN and consider discussing with provider and RRT  Notify: Charge Nurse/RN  Name of Charge Nurse/RN Notified Tammy T  Date Charge Nurse/RN Notified 06/24/20  Time Charge Nurse/RN Notified 1224  Notify: Provider  Provider Name/Title Dr. Mylo Red  Date Provider Notified 06/24/20  Time Provider Notified 1220  Notification Type Page  Notification Reason Other (Comment)  Response See new orders;Other (Comment) (midodrine started)  Date of Provider Response 06/24/20  Time of Provider Response 1225   Pt  Mews Yellow. Pt is due for metoprolol however bp is 72/62 on RA,  97/60, on LA Pulse 102, Spo2 100% on 3 L n/c. Per MD will start midodrine for bp support.

## 2020-06-24 NOTE — Telephone Encounter (Signed)
TCM....  Patient is being discharged      They are scheduled to see Thurmond Butts 9/29 at 11   They were seen for dyspnea  They need to be seen within 1 week      Currently admitted

## 2020-06-24 NOTE — Progress Notes (Signed)
Progress Note  Patient Name: Kristy Solomon Date of Encounter: 06/24/2020  Primary Cardiologist: Agbor-Etang  Subjective   No complaints this morning. Her dyspnea continues to improve on a daily basis.  She remains on supplemental oxygen via nasal cannula at 3 L. No chest pain, palpitations, dizziness, presyncope, or syncope. Lower extremity swelling is improving. Weight 98.6-->99.4 kg. Documented UOP 300 mL for the past 24 hours with a net - 4.4 L for the admission.  Slight bump in renal function on 9/21 with BUN/SCr trending from 43/2.0-47/2.22, leading her IV Lasix to be held at that time. Labs are pending this morning. No hematochezia or melena.   Inpatient Medications    Scheduled Meds: . allopurinol  50 mg Oral Daily  . atorvastatin  20 mg Oral Daily  . ferrous sulfate  325 mg Oral Q breakfast  . gabapentin  300 mg Oral TID  . metoprolol succinate  100 mg Oral Daily  . polyethylene glycol  17 g Oral Daily  . Rivaroxaban  15 mg Oral Q supper  . senna-docusate  1 tablet Oral BID  . sodium chloride flush  3 mL Intravenous Q12H   Continuous Infusions: . sodium chloride     PRN Meds: sodium chloride, acetaminophen, ipratropium-albuterol, ondansetron (ZOFRAN) IV, sodium chloride flush   Vital Signs    Vitals:   06/23/20 1541 06/23/20 1959 06/24/20 0407 06/24/20 0806  BP: 110/65 106/67 99/64 (!) 96/54  Pulse: 75 77 81 92  Resp: 17 17 16 18   Temp: 98.6 F (37 C) 98.4 F (36.9 C) 98 F (36.7 C) 97.7 F (36.5 C)  TempSrc:  Oral Oral Oral  SpO2: 98% 100% 100% 100%  Weight:   99.4 kg   Height:        Intake/Output Summary (Last 24 hours) at 06/24/2020 0814 Last data filed at 06/24/2020 0806 Gross per 24 hour  Intake --  Output 600 ml  Net -600 ml   Filed Weights   06/22/20 0635 06/23/20 0409 06/24/20 0407  Weight: 103.2 kg 98.6 kg 99.4 kg    Telemetry    Afib with ventricular rates in the 90s bpm with occasional PVCs  - Personally Reviewed  ECG    No  new tracings - Personally Reviewed  Physical Exam   GEN: No acute distress.   Neck: JVD mildly elevated. Cardiac: Irregularly irregular, no murmurs, rubs, or gallops.  Respiratory: Diminished breath sounds bilaterally.  GI: Soft, nontender, non-distended.   MS:  Improving trace bilateral pretibial bilateral lower extremity pitting edema to the mid shins; No deformity. Neuro:  Alert and oriented x 3; Nonfocal.  Psych: Normal affect.  Labs    Chemistry Recent Labs  Lab 06/21/20 0405 06/22/20 0607 06/23/20 0658  NA 141 143 142  K 4.1 3.8 4.1  CL 109 109 107  CO2 25 26 24   GLUCOSE 124* 135* 102*  BUN 43* 43* 47*  CREATININE 2.05* 2.00* 2.22*  CALCIUM 8.2* 8.3* 8.3*  GFRNONAA 21* 22* 19*  GFRAA 24* 25* 22*  ANIONGAP 7 8 11      Hematology Recent Labs  Lab 06/21/20 0405 06/22/20 0607 06/23/20 0658  WBC 5.0 5.8 6.1  RBC 4.97 5.26* 5.69*  HGB 11.3* 11.8* 12.7  HCT 37.0 39.8 41.5  MCV 74.4* 75.7* 72.9*  MCH 22.7* 22.4* 22.3*  MCHC 30.5 29.6* 30.6  RDW 19.4* 19.6* 19.9*  PLT 86* 97* 106*    Cardiac EnzymesNo results for input(s): TROPONINI in the last 168 hours. No  results for input(s): TROPIPOC in the last 168 hours.   BNP Recent Labs  Lab 06/20/20 0636  BNP 672.1*     DDimer No results for input(s): DDIMER in the last 168 hours.   Radiology    No results found.  Cardiac Studies   Echo 12/2019 1. Left ventricular ejection fraction, by estimation, is 50 to 55%. The  left ventricle has low normal function. The left ventricle has no regional  wall motion abnormalities. There is mild left ventricular hypertrophy.  Left ventricular diastolic  parameters are indeterminate.  2. Right ventricular systolic function is normal. The right ventricular  size is normal. Tricuspid regurgitation signal is inadequate for assessing  PA pressure.  3. Left atrial size was mildly dilated.  4. Right atrial size was mildly dilated.  5. The mitral valve is abnormal.  Mild mitral valve regurgitation. No  evidence of mitral stenosis.  6. Tricuspid valve regurgitation is mild to moderate.  7. The aortic valve is tricuspid. Aortic valve regurgitation is not  visualized. No aortic stenosis is present.  8. The inferior vena cava is dilated in size with <50% respiratory  variability, suggesting right atrial pressure of 15 mmHg.  Patient Profile     86 y.o. female with history of permanent atrial fibrillation, HFpEF, CKD stage IV, hypertension, lymphoma, and anemia of chronic disease who presented due to shortness of breath and volume overload in the setting of medication noncompliance.  Assessment & Plan    1. HFpEF: -Volume status is improving with slight bump in renal function on 9/21, leading her IV Lasix to be held at that time -BMP pending  -If renal function is improving, would place her back on PTA torsemide 40 mg bid -Leading up to her admission, she had been off torsemide for ~ 2 weeks which contributed to her presentation with volume overload -Consider addition of Jardiance as an outpatient -Daily weights -Strict I/O -CHF education   2. Permanent Afib: -Ventricular rates reasonably controlled -Continue Toprol XL -CHADS2VASc 5 (CHF, HTN, age x 2, gender) -Xarelto 15 mg -Estimated Creatinine Clearance: 20.1 mL/min (A) (by C-G formula based on SCr of 2.22 mg/dL (H)).  3. Acute on CKD stage IV: -Nephrology following  -Slight bump in renal function 9/21, with BMP pending at this time  -If renal function is improving, add PTA torsemide  4. HTN: -Blood pressure in the 90s to low 254Y systolic mostly -Asymptomatic  -Continue medications as above  5. Anemia of chronic disease: -Stable as of 9/21 -CBC pending this morning   Dispo: -She can be discharged from a cardiac perspective once she has been staffed by Dr. Garen Lah if her labs are stable to improved. Would place her back on PTA torsemide 40 mg bid, new dose of Toprol XL 100 mg  daily and continue renally-dosed Xarelto. She will need follow up in our office in 7-14 days. We will arrange this.    For questions or updates, please contact [place] Please consult [email or web address] for contact info under Cardiology/STEMI.    Signed, Christell Faith, PA-C Power Pager: [phone] 06/24/2020, 8:14 AM

## 2020-06-24 NOTE — Progress Notes (Addendum)
   06/24/20 1328  Assess: MEWS Score  BP (!) 67/48  Pulse Rate (!) 103  SpO2 95 %  O2 Device Room Air  Assess: MEWS Score  MEWS Temp 0  MEWS Systolic 3  MEWS Pulse 1  MEWS RR 0  MEWS LOC 0  MEWS Score 4  MEWS Score Color Red  Assess: if the MEWS score is Yellow or Red  Were vital signs taken at a resting state? Yes  Focused Assessment Change from prior assessment (see assessment flowsheet)  Early Detection of Sepsis Score *See Row Information* Low  MEWS guidelines implemented *See Row Information* Yes  Treat  MEWS Interventions Escalated (See documentation below)  Escalate  MEWS: Escalate Yellow: discuss with charge nurse/RN and consider discussing with provider and RRT  Notify: Charge Nurse/RN  Name of Charge Nurse/RN Notified Tammy T  Date Charge Nurse/RN Notified 06/24/20  Time Charge Nurse/RN Notified 1349  Notify: Provider  Provider Name/Title Dr. Reesa Chew, Dr. Mylo Red  Date Provider Notified 06/24/20  Time Provider Notified 1349  Notification Type Face-to-face  Notification Reason Other (Comment)  Response See new orders;Other (Comment) (MD to assess pt at bedside)  Date of Provider Response 06/24/20  Time of Provider Response 1350  per Dr. Mylo Red give another 5 mg of midodrine and start 10 mg/TID.will continue to monitor closely.

## 2020-06-24 NOTE — Progress Notes (Addendum)
[facility]  ROUNDING NOTE   Subjective:  Patient in no acute distress, found sleeping in bed, arousable to call. She is on 2L supplemental O2 via nasal cannula.  Objective:  Vital signs in last 24 hours:  Temp:  [97.5 F (36.4 C)-98.6 F (37 C)] 97.6 F (36.4 C) (09/22 1152) Pulse Rate:  [75-103] 103 (09/22 1328) Resp:  [16-18] 18 (09/22 1152) BP: (67-134)/(48-67) 67/48 (09/22 1328) SpO2:  [91 %-100 %] 100 % (09/22 1152) Weight:  [99.4 kg] 99.4 kg (09/22 0407)  Weight change: 0.828 kg Filed Weights   06/22/20 0635 06/23/20 0409 06/24/20 0407  Weight: 103.2 kg 98.6 kg 99.4 kg    Intake/Output: I/O last 3 completed shifts: In: 0  Out: 900 [Urine:900]   Intake/Output this shift:  Total I/O In: -  Out: 300 [Urine:300]  Physical Exam: General: Resting in bed, in no acute distress  Head: Normocephalic, atraumatic. Moist oral mucosal membranes  Eyes: Sclerae and conjunctivae clear  Neck: Supple, trachea midline  Lungs:  Bibasilar crackles,Normal and symmetrical respiratory effort  Heart: S1S2+,Irregular  Abdomen:  Soft, nontender,   Extremities:  2+ peripheral edema.  Neurologic: Found sleeping, opens eyes to call  Skin: No acute lesions or rashes        Basic Metabolic Panel: Recent Labs  Lab 06/20/20 0636 06/20/20 0636 06/21/20 0405 06/21/20 0405 06/22/20 0607 06/23/20 0658 06/24/20 0700  NA 140  --  141  --  143 142 142  K 4.7  --  4.1  --  3.8 4.1 3.9  CL 110  --  109  --  109 107 107  CO2 22  --  25  --  26 24 27   GLUCOSE 111*  --  124*  --  135* 102* 97  BUN 40*  --  43*  --  43* 47* 51*  CREATININE 1.95*  --  2.05*  --  2.00* 2.22* 2.25*  CALCIUM 8.6*   < > 8.2*   < > 8.3* 8.3* 8.1*  MG  --   --  2.0  --  2.1 2.0 2.1   < > = values in this interval not displayed.    Liver Function Tests: Recent Labs  Lab 06/24/20 0700  ALBUMIN 2.4*   No results for input(s): LIPASE, AMYLASE in the last 168 hours. No results for input(s):  AMMONIA in the last 168 hours.  CBC: Recent Labs  Lab 06/18/2020 0117 06/21/20 0405 06/22/20 0607 06/23/20 0658 06/24/20 0700  WBC 4.7 5.0 5.8 6.1 6.3  NEUTROABS  --  3.0 3.8 4.1 4.4  HGB 12.4 11.3* 11.8* 12.7 11.6*  HCT 41.7 37.0 39.8 41.5 39.3  MCV 75.7* 74.4* 75.7* 72.9* 75.1*  PLT 98* 86* 97* 106* 108*    Cardiac Enzymes: No results for input(s): CKTOTAL, CKMB, CKMBINDEX, TROPONINI in the last 168 hours.  BNP: Invalid input(s): POCBNP  CBG: No results for input(s): GLUCAP in the last 168 hours.  Microbiology: Results for orders placed or performed during the hospital encounter of 06/15/2020  Culture, blood (routine x 2)     Status: Abnormal   Collection Time: 06/28/2020  3:40 AM   Specimen: BLOOD  Result Value Ref Range Status   Specimen Description   Final    BLOOD LEFT [facility] Performed at [facility], [address], [place], [place] [address]    Special Requests   Final    BOTTLES DRAWN AEROBIC AND ANAEROBIC Blood Culture adequate volume Performed at [facility],  [place], [place] [address]    Culture  Setup Time   Final    GRAM POSITIVE COCCI AEROBIC BOTTLE ONLY CRITICAL VALUE NOTED.  VALUE IS CONSISTENT WITH PREVIOUSLY REPORTED AND CALLED VALUE. Performed at [facility], [address] [place], [place] [address]    Culture (A)  Final    STAPHYLOCOCCUS CAPITIS STAPHYLOCOCCUS EPIDERMIDIS    Report Status 06/23/2020 FINAL  Final   Organism ID, Bacteria STAPHYLOCOCCUS EPIDERMIDIS  Final      Susceptibility   Staphylococcus epidermidis - MIC*    CIPROFLOXACIN <=0.5 SENSITIVE Sensitive     ERYTHROMYCIN >=8 RESISTANT Resistant     GENTAMICIN <=0.5 SENSITIVE Sensitive     OXACILLIN <=0.25 SENSITIVE Sensitive     TETRACYCLINE <=1 SENSITIVE Sensitive     VANCOMYCIN 1 SENSITIVE Sensitive     TRIMETH/SULFA <=10 SENSITIVE Sensitive     CLINDAMYCIN <=0.25 SENSITIVE Sensitive     RIFAMPIN <=0.5 SENSITIVE Sensitive      Inducible Clindamycin NEGATIVE Sensitive     * STAPHYLOCOCCUS EPIDERMIDIS  Culture, blood (routine x 2)     Status: Abnormal   Collection Time: 06/28/2020  3:40 AM   Specimen: BLOOD  Result Value Ref Range Status   Specimen Description   Final    BLOOD RIGHT FA Performed at [facility], [address] [place], [place] [address]    Special Requests   Final    BOTTLES DRAWN AEROBIC AND ANAEROBIC Blood Culture adequate volume Performed at [facility], [address] [place],  [address]    Culture  Setup Time   Final    GRAM POSITIVE COCCI IN BOTH AEROBIC AND ANAEROBIC BOTTLES CRITICAL RESULT CALLED TO, READ BACK BY AND VERIFIED WITH: SUSAN WATSON AT 7628 06/29/2020.PMF    Culture (A)  Final    STAPHYLOCOCCUS HOMINIS STAPHYLOCOCCUS EPIDERMIDIS THE SIGNIFICANCE OF ISOLATING THIS ORGANISM FROM A SINGLE SET OF BLOOD CULTURES WHEN MULTIPLE SETS ARE DRAWN IS UNCERTAIN. PLEASE NOTIFY THE MICROBIOLOGY DEPARTMENT WITHIN ONE WEEK IF SPECIATION AND SENSITIVITIES ARE REQUIRED. Performed at [facility], [place] [address], [place],  [address]    Report Status 06/23/2020 FINAL  Final  Blood Culture ID Panel (Reflexed)     Status: Abnormal   Collection Time: 06/15/2020  3:40 AM  Result Value Ref Range Status   Enterococcus faecalis NOT DETECTED NOT DETECTED Final   Enterococcus Faecium NOT DETECTED NOT DETECTED Final   Listeria monocytogenes NOT DETECTED NOT DETECTED Final   Staphylococcus species DETECTED (A) NOT DETECTED Final    Comment: CRITICAL RESULT CALLED TO, READ BACK BY AND VERIFIED WITH: SUSAN WATSON AT 1833 06/29/2020.PMF    Staphylococcus aureus (BCID) NOT DETECTED NOT DETECTED Final   Staphylococcus epidermidis DETECTED (A) NOT DETECTED Final    Comment: Methicillin (oxacillin) resistant coagulase negative staphylococcus. Possible blood culture contaminant (unless isolated from more than one blood culture draw or clinical case suggests pathogenicity).  No antibiotic treatment is indicated for blood  culture contaminants. CRITICAL RESULT CALLED TO, READ BACK BY AND VERIFIED WITH: SUSAN WATSON AT 6160 06/15/2020.PMF    Staphylococcus lugdunensis NOT DETECTED NOT DETECTED Final   Streptococcus species NOT DETECTED NOT DETECTED Final   Streptococcus agalactiae NOT DETECTED NOT DETECTED Final   Streptococcus pneumoniae NOT DETECTED NOT DETECTED Final   Streptococcus pyogenes NOT DETECTED NOT DETECTED Final   A.calcoaceticus-baumannii NOT DETECTED NOT DETECTED Final   Bacteroides fragilis NOT DETECTED NOT DETECTED Final   Enterobacterales NOT DETECTED NOT  DETECTED Final   Enterobacter cloacae complex NOT DETECTED NOT DETECTED Final   Escherichia coli NOT DETECTED NOT DETECTED Final   Klebsiella aerogenes NOT DETECTED NOT DETECTED Final   Klebsiella oxytoca NOT DETECTED NOT DETECTED Final   Klebsiella pneumoniae NOT DETECTED NOT DETECTED Final   Proteus species NOT DETECTED NOT DETECTED Final   Salmonella species NOT DETECTED NOT DETECTED Final   Serratia marcescens NOT DETECTED NOT DETECTED Final   Haemophilus influenzae NOT DETECTED NOT DETECTED Final   Neisseria meningitidis NOT DETECTED NOT DETECTED Final   Pseudomonas aeruginosa NOT DETECTED NOT DETECTED Final   Stenotrophomonas maltophilia NOT DETECTED NOT DETECTED Final   Candida albicans NOT DETECTED NOT DETECTED Final   Candida auris NOT DETECTED NOT DETECTED Final   Candida glabrata NOT DETECTED NOT DETECTED Final   Candida krusei NOT DETECTED NOT DETECTED Final   Candida parapsilosis NOT DETECTED NOT DETECTED Final   Candida tropicalis NOT DETECTED NOT DETECTED Final   Cryptococcus neoformans/gattii NOT DETECTED NOT DETECTED Final   Methicillin resistance mecA/C DETECTED (A) NOT DETECTED Final    Comment: CRITICAL RESULT CALLED TO, READ BACK BY AND VERIFIED WITH: SUSAN WATSON AT 1833 06/06/2020.PMF Performed at [facility], [address] [place], [place] [address]    SARS Coronavirus 2 by RT PCR (hospital order, performed in [facility] hospital lab) Nasopharyngeal Nasopharyngeal Swab     Status: None   Collection Time: 06/13/2020 12:01 PM   Specimen: Nasopharyngeal Swab  Result Value Ref Range Status   SARS Coronavirus 2 NEGATIVE NEGATIVE Final    Comment: (NOTE) SARS-CoV-2 target nucleic acids are NOT DETECTED.  The SARS-CoV-2 RNA is generally detectable in upper and lower respiratory specimens during the acute phase of infection. The lowest concentration of SARS-CoV-2 viral copies this assay can detect is 250 copies / mL. A negative result does not preclude SARS-CoV-2 infection and should not be used as the sole basis for treatment or other patient management decisions.  A negative result may occur with improper specimen collection / handling, submission of specimen other than nasopharyngeal swab, presence of viral mutation(s) within the areas targeted by this assay, and inadequate number of viral copies (<250 copies / mL). A negative result must be combined with clinical observations, patient history, and epidemiological information.  Fact Sheet for Patients:   StrictlyIdeas.no  Fact Sheet for Healthcare Providers: BankingDealers.co.za  This test is not yet approved or  cleared by the [place] FDA and has been authorized for detection and/or diagnosis of SARS-CoV-2 by FDA under an Emergency Use Authorization (EUA).  This EUA will remain in effect (meaning this test can be used) for the duration of the COVID-19 declaration under Section 564(b)(1) of the Act, 21 U.S.C. section 360bbb-3(b)(1), unless the authorization is terminated or revoked sooner.  Performed at [facility], [address] [place], [place] [address]   CULTURE, BLOOD (ROUTINE X 2) w Reflex to ID Panel     Status: None (Preliminary result)   Collection Time: 06/20/20  5:34 PM   Specimen: BLOOD  Result Value  Ref Range Status   Specimen Description BLOOD [facility]  Final   Special Requests   Final    BOTTLES DRAWN AEROBIC AND ANAEROBIC Blood Culture adequate volume   Culture   Final    NO GROWTH 4 DAYS Performed at [facility], [address] [place], [place] [address]    Report Status PENDING  Incomplete  CULTURE, BLOOD (ROUTINE X 2) w Reflex to ID  Panel     Status: None (Preliminary result)   Collection Time: 06/20/20  5:34 PM   Specimen: BLOOD  Result Value Ref Range Status   Specimen Description BLOOD RAC  Final   Special Requests   Final    BOTTLES DRAWN AEROBIC AND ANAEROBIC Blood Culture adequate volume   Culture   Final    NO GROWTH 4 DAYS Performed at [facility], [address] [place], [place] [address]    Report Status PENDING  Incomplete    Coagulation Studies: No results for input(s): LABPROT, INR in the last 72 hours.  Urinalysis: No results for input(s): COLORURINE, LABSPEC, PHURINE, GLUCOSEU, HGBUR, BILIRUBINUR, KETONESUR, PROTEINUR, UROBILINOGEN, NITRITE, LEUKOCYTESUR in the last 72 hours.  Invalid input(s): APPERANCEUR    Imaging: No results found.   Medications:   . sodium chloride    . sodium chloride     . allopurinol  50 mg Oral Daily  . atorvastatin  20 mg Oral Daily  . ferrous sulfate  325 mg Oral Q breakfast  . gabapentin  300 mg Oral TID  . metoprolol succinate  50 mg Oral BID  . midodrine  5 mg Oral TID WC  . polyethylene glycol  17 g Oral Daily  . Rivaroxaban  15 mg Oral Q supper  . senna-docusate  1 tablet Oral BID  . sodium chloride flush  3 mL Intravenous Q12H  . torsemide  40 mg Oral Daily   sodium chloride, acetaminophen, ipratropium-albuterol, ondansetron (ZOFRAN) IV, sodium chloride flush  Assessment/ Plan:  Kristy Solomon is a 86 y.o. black female with congestive heart failure, hypertension, atrial fibrillation, hyperlipidemia, history of lymphoma, who was admitted to [facility] on 06/10/2020 for Hypoxia  [R09.02] Elevated TSH [R79.89] Acute CHF (congestive heart failure) (HCC) [I50.9] Acute on chronic congestive heart failure, unspecified heart failure type ([place]) [I50.9]  1. Acute renal failure on chronic kidney disease stage IV with proteinuria : baseline creatinine of 1.75, GFR of 26 on 05/08/20.   Acute renal failure secondary to acute cardiorenal syndrome.  - renal function is too low to start empagliflozin Creatinine 2.25 today  2. Hypotension BP readings stays low  Home regimen of torsemide 40mg  bid  Resume Torsemide today Metoprolol held today due to low BP Continue monitoring BP readings closely Albumin 2.4 today Will start IV Albumin today   3. Anemia with chronic kidney disease Hgb at the goal 11.6 today  4.Chronic Atrial fibrillation:  Rate controlled with metoprolol,stays at low 100's  anticoagulation with rivaroxaban    LOS: 5 Princy Raju 9/22/20211:38 PM  Patient was seen and examined with Crosby Oyster, DNP. Starting IV albumin due to hypotension and anasarca. Above plan was discussed and agreed upon on the signing of this note.   Lavonia Dana, [facility]  9/22/20212:02 PM

## 2020-06-25 DIAGNOSIS — N184 Chronic kidney disease, stage 4 (severe): Secondary | ICD-10-CM

## 2020-06-25 DIAGNOSIS — I509 Heart failure, unspecified: Secondary | ICD-10-CM

## 2020-06-25 LAB — CBC WITH DIFFERENTIAL/PLATELET
Abs Immature Granulocytes: 0.02 10*3/uL (ref 0.00–0.07)
Basophils Absolute: 0.1 10*3/uL (ref 0.0–0.1)
Basophils Relative: 1 %
Eosinophils Absolute: 0.1 10*3/uL (ref 0.0–0.5)
Eosinophils Relative: 2 %
HCT: 37.3 % (ref 36.0–46.0)
Hemoglobin: 11.3 g/dL — ABNORMAL LOW (ref 12.0–15.0)
Immature Granulocytes: 0 %
Lymphocytes Relative: 26 %
Lymphs Abs: 1.5 10*3/uL (ref 0.7–4.0)
MCH: 22.5 pg — ABNORMAL LOW (ref 26.0–34.0)
MCHC: 30.3 g/dL (ref 30.0–36.0)
MCV: 74.2 fL — ABNORMAL LOW (ref 80.0–100.0)
Monocytes Absolute: 0.8 10*3/uL (ref 0.1–1.0)
Monocytes Relative: 15 %
Neutro Abs: 3.1 10*3/uL (ref 1.7–7.7)
Neutrophils Relative %: 56 %
Platelets: 113 10*3/uL — ABNORMAL LOW (ref 150–400)
RBC: 5.03 MIL/uL (ref 3.87–5.11)
RDW: 19.6 % — ABNORMAL HIGH (ref 11.5–15.5)
Smear Review: NORMAL
WBC: 5.5 10*3/uL (ref 4.0–10.5)
nRBC: 0 % (ref 0.0–0.2)

## 2020-06-25 LAB — RENAL FUNCTION PANEL
Albumin: 3.1 g/dL — ABNORMAL LOW (ref 3.5–5.0)
Anion gap: 8 (ref 5–15)
BUN: 53 mg/dL — ABNORMAL HIGH (ref 8–23)
CO2: 27 mmol/L (ref 22–32)
Calcium: 8.4 mg/dL — ABNORMAL LOW (ref 8.9–10.3)
Chloride: 105 mmol/L (ref 98–111)
Creatinine, Ser: 2.11 mg/dL — ABNORMAL HIGH (ref 0.44–1.00)
GFR calc Af Amer: 23 mL/min — ABNORMAL LOW (ref >60)
GFR calc non Af Amer: 20 mL/min — ABNORMAL LOW (ref >60)
Glucose, Bld: 103 mg/dL — ABNORMAL HIGH (ref 70–99)
Phosphorus: 4.1 mg/dL (ref 2.5–4.6)
Potassium: 3.9 mmol/L (ref 3.5–5.1)
Sodium: 140 mmol/L (ref 135–145)

## 2020-06-25 LAB — MAGNESIUM: Magnesium: 2.1 mg/dL (ref 1.7–2.4)

## 2020-06-25 LAB — CULTURE, BLOOD (ROUTINE X 2)
Culture: NO GROWTH
Culture: NO GROWTH
Special Requests: ADEQUATE
Special Requests: ADEQUATE

## 2020-06-25 LAB — T3: T3, Total: 40 ng/dL — ABNORMAL LOW (ref 71–180)

## 2020-06-25 NOTE — Progress Notes (Signed)
Mobility Specialist - Progress Note   06/25/20 1500  Mobility  Activity Ambulated in room  Level of Assistance Standby assist, set-up cues, supervision of patient - no hands on  Assistive Device Front wheel walker  Distance Ambulated (ft) 40 ft  Mobility Response Tolerated well  Mobility performed by Mobility specialist  $Mobility charge 1 Mobility    Pre-mobility: 100 HR, 81/70 BP, 99% SpO2 Post-mobility: 98 HR, 108/68 BP, 100% SpO2   Pt was lying in bed upon arrival. Pt agreed to session. Pt denied any pain, dizziness, or fatigue. Pt was able to get EOB with SBA and perform sit-to-stand transfer with minA. Pt had an urinary/BM incontinent episode while standing. Mobility assisted with hygiene. Pt ambulated 40' in room with RW and SBA. Pt denied SOB and no LOB was noted. Overall, pt tolerated session well. Pt was left in recliner with alarm set and all needs in reach. Nurse was notified of performance.    Kathee Delton Mobility Specialist 06/25/20, 3:26 PM

## 2020-06-25 NOTE — Progress Notes (Signed)
° °  Heart Failure Nurse Navigator Note  HFpEF 50-55%  She presented from home with lower extremity edema, in afib  with RVR.  Requiring more oxygen then she normally uses.  She had been several days without her medications.  Comorbidities:  Permanent A fib CKD stage 4 Hypertension Anemia Hyperlipidemia  Medications:  Lasix on hold Metoprolol succinate 100 mg daily Lipitor 40 mg daily Xarelto 15 mg with supper  Labs:  Sodium 140, potassium 3.9, BUN 53, creatinine 2.11 (2.25 yesterday), magnesium 2.1, hemoglobin 11.3 amd hematocrit 37.3  Intake-187 ml Output-551 ml  Weight-98.6 kg  Assessment:  General-she is awake and alert, denies any increasing SOB.  She is very talkative today. Asking questions.  HEENT-pupils are equal.  Cardiac-heart tones area irregular  Respiratory-breath sounds diminished in the bases,  Gi- abdomen rounded non tender.  Musculoskeletal- lower extremity edema +1.  Psych- pleasant and appropriate, makes eye contact.    Spoke with patient today about caring for herself when she goes home, she is excited to be having help.  She tells me she does the cooking, likes fish, baked chicken, hamburgers and spaghetti,she says she does not add seasoning.  She also hopes with labeling her medication bottles as she asked  she thinks this will aid in her doing a better job of being compliant.    Will continue to follow.  Pricilla Riffle RN, CHFN

## 2020-06-25 NOTE — Care Management Important Message (Signed)
Important Message  Patient Details  Name: Kristy Solomon MRN: [id number] Date of Birth: [birth year]   Medicare Important Message Given:  Yes     [name] 06/25/2020, 2:03 PM

## 2020-06-25 NOTE — Progress Notes (Deleted)
Cardiology Office Note    Date:  06/25/2020   ID:  Kristy Solomon, DOB Mar 29, 1929, MRN [id number]  PCP:  Jodi Marble, MD  Cardiologist:  Kate Sable, MD  Electrophysiologist:  None   Chief Complaint: Hospital follow up  History of Present Illness:   Kristy Solomon is a 86 y.o. female with history of ***  ***   Labs independently reviewed: ***  Past Medical History:  Diagnosis Date  . (HFpEF) heart failure with preserved ejection fraction ([place])    a. 12/2019 Echo: EF 50-55%, no rwma, mild LVH. Nl RV fxn. Mildly BAE. Mild MR, mild-mod TR.  Marland Kitchen Anemia in neoplastic disease 06/27/2014  . Aortic atherosclerosis ([place])    a. Seen on CT 05/2020.  Marland Kitchen CKD (chronic kidney disease), stage III-IV   . Coronary artery calcification seen on CT scan 05/2020  . COVID-19 virus infection 01/2020  . Hyperlipidemia   . Hypertension   . 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 Permanent atrial fibrillation (HCC)    a. CHA2DS2VASc = 6-->eliquis.    Past Surgical History:  Procedure Laterality Date  . ABDOMINAL HYSTERECTOMY    . HEMORRHOID SURGERY      Current Medications: No outpatient medications have been marked as taking for the 07/01/20 encounter (Appointment) with Rise Mu, PA-C.    Allergies:   Eliquis [apixaban]   Social History   Socioeconomic History  . Marital status: Widowed    Spouse name: Not on file  . Number of children: Not on file  . Years of education: Not on file  . Highest education level: Not on file  Occupational History  . Not on file  Tobacco Use  . Smoking status: Never Smoker  . Smokeless tobacco: Never Used  Vaping Use  . Vaping Use: Never used  Substance and Sexual Activity  . Alcohol use: No  . Drug use: No  . Sexual activity: Not on file  Other Topics Concern  . Not on file  Social History Narrative   Lives alone. Daughter assists as needed. Activity very limited by DOE and lower ext edema/wkns.   Social  Determinants of Health   Financial Resource Strain:   . Difficulty of Paying Living Expenses: Not on file  Food Insecurity:   . Worried About Charity fundraiser in the Last Year: Not on file  . Ran Out of Food in the Last Year: Not on file  Transportation Needs:   . Lack of Transportation (Medical): Not on file  . Lack of Transportation (Non-Medical): Not on file  Physical Activity:   . Days of Exercise per Week: Not on file  . Minutes of Exercise per Session: Not on file  Stress:   . Feeling of Stress : Not on file  Social Connections:   . Frequency of Communication with Friends and Family: Not on file  . Frequency of Social Gatherings with Friends and Family: Not on file  . Attends Religious Services: Not on file  . Active Member of Clubs or Organizations: Not on file  . Attends Archivist Meetings: Not on file  . Marital Status: Not on file     Family History:  The patient's family history includes Cancer in her brother and brother.  ROS:   ROS   EKGs/Labs/Other Studies Reviewed:    Studies reviewed were summarized above. The additional studies were reviewed today:  2D echo 12/2019: 1. Left ventricular ejection fraction, by estimation, is 50 to 55%.  The  left ventricle has low normal function. The left ventricle has no regional  wall motion abnormalities. There is mild left ventricular hypertrophy.  Left ventricular diastolic  parameters are indeterminate.  2. Right ventricular systolic function is normal. The right ventricular  size is normal. Tricuspid regurgitation signal is inadequate for assessing  PA pressure.  3. Left atrial size was mildly dilated.  4. Right atrial size was mildly dilated.  5. The mitral valve is abnormal. Mild mitral valve regurgitation. No  evidence of mitral stenosis.  6. Tricuspid valve regurgitation is mild to moderate.  7. The aortic valve is tricuspid. Aortic valve regurgitation is not  visualized. No aortic stenosis  is present.  8. The inferior vena cava is dilated in size with <50% respiratory  variability, suggesting right atrial pressure of 15 mmHg. __________  2D echo 10/2019: 1. Left ventricular ejection fraction, by visual estimation, is 60 to  65%. The left ventricle has normal function. There is mild to moderately  increased left ventricular hypertrophy.  2. The left ventricle has no regional wall motion abnormalities.  3. Left ventricular diastolic parameters are consistent with Grade II  diastolic dysfunction (pseudonormalization).  4. Global right ventricle has normal systolic function.The right  ventricular size is normal. No increase in right ventricular wall  thickness.  5. Left atrial size was moderately dilated.  6. Normal pulmonary artery systolic pressure.  7. Frequent PVCs noted.  8. Trivial pericardial effusion is present. __________  Elwyn Reach patch 07/2019: Patient had a min HR of 36 bpm, max HR of 171 bpm, and avg HR of 67 bpm. Predominant underlying rhythm was Sinus Rhythm. First Degree AV Block was present. occasional Supraventricular Tachycardia runs occurred, the run with the fastest interval lasting 4 beats with a max rate of 171 bpm, Isolated SVEs were frequent (5.6%, [id number]), SVE Couplets were rare (<1.0%, 4699),  and SVE Triplets were rare (<1.0%, 508). Isolated VEs were frequent (20.3%, [id number]), VE Couplets were rare (<1.0%, 2113), and VE Triplets were rare (<1.0%, 986).  __________  2D echo 09/2018: - Left ventricle: The cavity size was normal. Wall thickness was  increased in a pattern of moderate to severe LVH. Systolic  function was normal. The estimated ejection fraction was in the  range of 60% to 65%. Wall motion was normal; there were no  regional wall motion abnormalities. Doppler parameters are  consistent with abnormal left ventricular relaxation (grade 1  diastolic dysfunction). Doppler parameters are consistent with  high  ventricular filling pressure.  - Left atrium: The atrium was moderately dilated.  - Right ventricle: The cavity size was normal. Wall thickness was  normal. Systolic function was normal.  - Right atrium: The atrium was mildly dilated.  - Tricuspid valve: There was moderate regurgitation.  - Pericardium, extracardiac: A trivial pericardial effusion was  identified.  __________  2D echo 03/2016: - Left ventricle: The cavity size was normal. Wall thickness was  increased in a pattern of moderate LVH. Systolic function was  vigorous. The estimated ejection fraction was in the range of 65%  to 70%. Wall motion was normal; there were no regional wall  motion abnormalities. Doppler parameters are consistent with  abnormal left ventricular relaxation (grade 1 diastolic  dysfunction).  - Mitral valve: There was mild regurgitation.  - Left atrium: The atrium was mildly dilated.  - Right atrium: The atrium was dilated.  - Tricuspid valve: There was mild-moderate regurgitation.  - Pulmonary arteries: Systolic pressure was mildly to moderately  increased.  PA peak pressure: 53 mm Hg (S).  - Pericardium, extracardiac: A trivial pericardial effusion was  identified circumferential to the heart. __________  Nuclear stress test 12/2007: IMPRESSION:   1)Normal LEFT ventricular function.   2)Normal wall motion.   3)Normal Myoview scintigraphy without evidence for scar or ischemia.    EKG:  EKG is ordered today.  The EKG ordered today demonstrates ***  Recent Labs: 04/23/2020: ALT 16 06/06/2020: TSH 2.923 06/20/2020: B Natriuretic Peptide 672.1 06/25/2020: BUN 53; Creatinine, Ser 2.11; Hemoglobin 11.3; Magnesium 2.1; Platelets 113; Potassium 3.9; Sodium 140  Recent Lipid Panel    Component Value Date/Time   CHOL 99 01/01/2020 0526   TRIG 52 01/01/2020 0526   HDL 47 01/01/2020 0526   CHOLHDL 2.1 01/01/2020 0526   VLDL 10 01/01/2020 0526   LDLCALC 42 01/01/2020 0526     PHYSICAL EXAM:    VS:  There were no vitals taken for this visit.  BMI: There is no height or weight on file to calculate BMI.  Physical Exam  Wt Readings from Last 3 Encounters:  06/25/20 217 lb 4.8 oz (98.6 kg)  05/08/20 198 lb 6.4 oz (90 kg)  04/27/20 (!) 207 lb (93.9 kg)     ASSESSMENT & PLAN:   1. ***  Disposition: F/u with Dr. Garen Lah or an APP in ***.   Medication Adjustments/Labs and Tests Ordered: Current medicines are reviewed at length with the patient today.  Concerns regarding medicines are outlined above. Medication changes, Labs and Tests ordered today are summarized above and listed in the Patient Instructions accessible in Encounters.   Signed, Christell Faith, PA-C 06/25/2020 1:38 PM     [address] [place] [place], [place] [address] [phone]

## 2020-06-25 NOTE — Progress Notes (Addendum)
[facility]  ROUNDING NOTE   Subjective:  Patient more awake and alert today,ansering questions appropriately.  Objective:  Vital signs in last 24 hours:  Temp:  [97.5 F (36.4 C)-98.4 F (36.9 C)] 97.7 F (36.5 C) (09/23 1122) Pulse Rate:  [74-98] 86 (09/23 1122) Resp:  [14-20] 17 (09/23 1122) BP: (96-132)/(60-116) 101/60 (09/23 1122) SpO2:  [95 %-100 %] 100 % (09/23 1122) Weight:  [98.6 kg] 98.6 kg (09/23 0354)  Weight change: -0.862 kg Filed Weights   06/23/20 0409 06/24/20 0407 06/25/20 0354  Weight: 98.6 kg 99.4 kg 98.6 kg    Intake/Output: I/O last 3 completed shifts: In: 187.7 [P.O.:120; I.V.:3; IV Piggyback:64.7] Out: 551 [Urine:550; Stool:1]   Intake/Output this shift:  Total I/O In: 240 [P.O.:240] Out: 100 [Urine:100]  Physical Exam: General: In no acute distress  Head:  Moist oral mucosal membranes  Eyes: Sclerae and conjunctivae clear  Neck: Supple, trachea midline  Lungs:  Diminished at the bases  Heart: Irregular rhythm  Abdomen:  Soft, nontender, obese  Extremities:  1+ peripheral edema.  Neurologic: Awake,alert, answers simple questions appropriately  Skin: No acute lesions or rashes        Basic Metabolic Panel: Recent Labs  Lab 06/21/20 0405 06/21/20 0405 06/22/20 0607 06/22/20 0607 06/23/20 0658 06/24/20 0700 06/25/20 0602  NA 141  --  143  --  142 142 140  K 4.1  --  3.8  --  4.1 3.9 3.9  CL 109  --  109  --  107 107 105  CO2 25  --  26  --  24 27 27   GLUCOSE 124*  --  135*  --  102* 97 103*  BUN 43*  --  43*  --  47* 51* 53*  CREATININE 2.05*  --  2.00*  --  2.22* 2.25* 2.11*  CALCIUM 8.2*   < > 8.3*   < > 8.3* 8.1* 8.4*  MG 2.0  --  2.1  --  2.0 2.1 2.1  PHOS  --   --   --   --   --   --  4.1   < > = values in this interval not displayed.    Liver Function Tests: Recent Labs  Lab 06/24/20 0700 06/25/20 0602  ALBUMIN 2.4* 3.1*   No results for input(s): LIPASE, AMYLASE in the last 168 hours. No results  for input(s): AMMONIA in the last 168 hours.  CBC: Recent Labs  Lab 06/21/20 0405 06/22/20 0607 06/23/20 0658 06/24/20 0700 06/25/20 0602  WBC 5.0 5.8 6.1 6.3 5.5  NEUTROABS 3.0 3.8 4.1 4.4 3.1  HGB 11.3* 11.8* 12.7 11.6* 11.3*  HCT 37.0 39.8 41.5 39.3 37.3  MCV 74.4* 75.7* 72.9* 75.1* 74.2*  PLT 86* 97* 106* 108* 113*    Cardiac Enzymes: No results for input(s): CKTOTAL, CKMB, CKMBINDEX, TROPONINI in the last 168 hours.  BNP: Invalid input(s): POCBNP  CBG: No results for input(s): GLUCAP in the last 168 hours.  Microbiology: Results for orders placed or performed during the hospital encounter of 07/01/2020  Culture, blood (routine x 2)     Status: Abnormal   Collection Time: 06/10/2020  3:40 AM   Specimen: BLOOD  Result Value Ref Range Status   Specimen Description   Final    BLOOD LEFT [facility] Performed at [facility], [address] [place], [place] [address]    Special Requests   Final    BOTTLES DRAWN AEROBIC AND ANAEROBIC Blood Culture adequate volume  Performed at [facility], [address] [place], [place] [address]    Culture  Setup Time   Final    GRAM POSITIVE COCCI AEROBIC BOTTLE ONLY CRITICAL VALUE NOTED.  VALUE IS CONSISTENT WITH PREVIOUSLY REPORTED AND CALLED VALUE. Performed at [facility], [address] [place], [place] [address]    Culture (A)  Final    STAPHYLOCOCCUS CAPITIS STAPHYLOCOCCUS EPIDERMIDIS    Report Status 06/23/2020 FINAL  Final   Organism ID, Bacteria STAPHYLOCOCCUS EPIDERMIDIS  Final      Susceptibility   Staphylococcus epidermidis - MIC*    CIPROFLOXACIN <=0.5 SENSITIVE Sensitive     ERYTHROMYCIN >=8 RESISTANT Resistant     GENTAMICIN <=0.5 SENSITIVE Sensitive     OXACILLIN <=0.25 SENSITIVE Sensitive     TETRACYCLINE <=1 SENSITIVE Sensitive     VANCOMYCIN 1 SENSITIVE Sensitive     TRIMETH/SULFA <=10 SENSITIVE Sensitive     CLINDAMYCIN <=0.25 SENSITIVE Sensitive     RIFAMPIN <=0.5 SENSITIVE  Sensitive     Inducible Clindamycin NEGATIVE Sensitive     * STAPHYLOCOCCUS EPIDERMIDIS  Culture, blood (routine x 2)     Status: Abnormal   Collection Time: 06/06/2020  3:40 AM   Specimen: BLOOD  Result Value Ref Range Status   Specimen Description   Final    BLOOD RIGHT FA Performed at [facility], [address], [place], [place] [address]    Special Requests   Final    BOTTLES DRAWN AEROBIC AND ANAEROBIC Blood Culture adequate volume Performed at [facility], [address] [place], [place] [address]    Culture  Setup Time   Final    GRAM POSITIVE COCCI IN BOTH AEROBIC AND ANAEROBIC BOTTLES CRITICAL RESULT CALLED TO, READ BACK BY AND VERIFIED WITH: SUSAN WATSON AT 3235 06/24/2020.PMF    Culture (A)  Final    STAPHYLOCOCCUS HOMINIS STAPHYLOCOCCUS EPIDERMIDIS THE SIGNIFICANCE OF ISOLATING THIS ORGANISM FROM A SINGLE SET OF BLOOD CULTURES WHEN MULTIPLE SETS ARE DRAWN IS UNCERTAIN. PLEASE NOTIFY THE MICROBIOLOGY DEPARTMENT WITHIN ONE WEEK IF SPECIATION AND SENSITIVITIES ARE REQUIRED. Performed at [facility], [address], [place], [place] [address]    Report Status 06/23/2020 FINAL  Final  Blood Culture ID Panel (Reflexed)     Status: Abnormal   Collection Time: 06/15/2020  3:40 AM  Result Value Ref Range Status   Enterococcus faecalis NOT DETECTED NOT DETECTED Final   Enterococcus Faecium NOT DETECTED NOT DETECTED Final   Listeria monocytogenes NOT DETECTED NOT DETECTED Final   Staphylococcus species DETECTED (A) NOT DETECTED Final    Comment: CRITICAL RESULT CALLED TO, READ BACK BY AND VERIFIED WITH: SUSAN WATSON AT 1833 06/24/2020.PMF    Staphylococcus aureus (BCID) NOT DETECTED NOT DETECTED Final   Staphylococcus epidermidis DETECTED (A) NOT DETECTED Final    Comment: Methicillin (oxacillin) resistant coagulase negative staphylococcus. Possible blood culture contaminant (unless isolated from more than one blood culture draw or clinical case suggests  pathogenicity). No antibiotic treatment is indicated for blood  culture contaminants. CRITICAL RESULT CALLED TO, READ BACK BY AND VERIFIED WITH: SUSAN WATSON AT 0254 06/15/2020.PMF    Staphylococcus lugdunensis NOT DETECTED NOT DETECTED Final   Streptococcus species NOT DETECTED NOT DETECTED Final   Streptococcus agalactiae NOT DETECTED NOT DETECTED Final   Streptococcus pneumoniae NOT DETECTED NOT DETECTED Final   Streptococcus pyogenes NOT DETECTED NOT DETECTED Final   A.calcoaceticus-baumannii NOT DETECTED NOT DETECTED Final   Bacteroides fragilis NOT DETECTED NOT DETECTED Final  Enterobacterales NOT DETECTED NOT DETECTED Final   Enterobacter cloacae complex NOT DETECTED NOT DETECTED Final   Escherichia coli NOT DETECTED NOT DETECTED Final   Klebsiella aerogenes NOT DETECTED NOT DETECTED Final   Klebsiella oxytoca NOT DETECTED NOT DETECTED Final   Klebsiella pneumoniae NOT DETECTED NOT DETECTED Final   Proteus species NOT DETECTED NOT DETECTED Final   Salmonella species NOT DETECTED NOT DETECTED Final   Serratia marcescens NOT DETECTED NOT DETECTED Final   Haemophilus influenzae NOT DETECTED NOT DETECTED Final   Neisseria meningitidis NOT DETECTED NOT DETECTED Final   Pseudomonas aeruginosa NOT DETECTED NOT DETECTED Final   Stenotrophomonas maltophilia NOT DETECTED NOT DETECTED Final   Candida albicans NOT DETECTED NOT DETECTED Final   Candida auris NOT DETECTED NOT DETECTED Final   Candida glabrata NOT DETECTED NOT DETECTED Final   Candida krusei NOT DETECTED NOT DETECTED Final   Candida parapsilosis NOT DETECTED NOT DETECTED Final   Candida tropicalis NOT DETECTED NOT DETECTED Final   Cryptococcus neoformans/gattii NOT DETECTED NOT DETECTED Final   Methicillin resistance mecA/C DETECTED (A) NOT DETECTED Final    Comment: CRITICAL RESULT CALLED TO, READ BACK BY AND VERIFIED WITH: SUSAN WATSON AT 1833 06/05/2020.PMF Performed at [facility], [address]  [place], [place] [address]   SARS Coronavirus 2 by RT PCR (hospital order, performed in [facility] hospital lab) Nasopharyngeal Nasopharyngeal Swab     Status: None   Collection Time: 06/13/2020 12:01 PM   Specimen: Nasopharyngeal Swab  Result Value Ref Range Status   SARS Coronavirus 2 NEGATIVE NEGATIVE Final    Comment: (NOTE) SARS-CoV-2 target nucleic acids are NOT DETECTED.  The SARS-CoV-2 RNA is generally detectable in upper and lower respiratory specimens during the acute phase of infection. The lowest concentration of SARS-CoV-2 viral copies this assay can detect is 250 copies / mL. A negative result does not preclude SARS-CoV-2 infection and should not be used as the sole basis for treatment or other patient management decisions.  A negative result may occur with improper specimen collection / handling, submission of specimen other than nasopharyngeal swab, presence of viral mutation(s) within the areas targeted by this assay, and inadequate number of viral copies (<250 copies / mL). A negative result must be combined with clinical observations, patient history, and epidemiological information.  Fact Sheet for Patients:   StrictlyIdeas.no  Fact Sheet for Healthcare Providers: BankingDealers.co.za  This test is not yet approved or  cleared by the [place] FDA and has been authorized for detection and/or diagnosis of SARS-CoV-2 by FDA under an Emergency Use Authorization (EUA).  This EUA will remain in effect (meaning this test can be used) for the duration of the COVID-19 declaration under Section 564(b)(1) of the Act, 21 U.S.C. section 360bbb-3(b)(1), unless the authorization is terminated or revoked sooner.  Performed at [facility], [address] [place], [place] [address]   CULTURE, BLOOD (ROUTINE X 2) w Reflex to ID Panel     Status: None   Collection Time: 06/20/20  5:34 PM   Specimen: BLOOD  Result Value  Ref Range Status   Specimen Description BLOOD [facility]  Final   Special Requests   Final    BOTTLES DRAWN AEROBIC AND ANAEROBIC Blood Culture adequate volume   Culture   Final    NO GROWTH 5 DAYS Performed at [facility], [address] [place], [place] [address]    Report Status 06/25/2020 FINAL  Final  CULTURE, BLOOD (ROUTINE X 2) w  Reflex to ID Panel     Status: None   Collection Time: 06/20/20  5:34 PM   Specimen: BLOOD  Result Value Ref Range Status   Specimen Description BLOOD RAC  Final   Special Requests   Final    BOTTLES DRAWN AEROBIC AND ANAEROBIC Blood Culture adequate volume   Culture   Final    NO GROWTH 5 DAYS Performed at [facility], [address], [place], [place] [address]    Report Status 06/25/2020 FINAL  Final    Coagulation Studies: No results for input(s): LABPROT, INR in the last 72 hours.  Urinalysis: No results for input(s): COLORURINE, LABSPEC, PHURINE, GLUCOSEU, HGBUR, BILIRUBINUR, KETONESUR, PROTEINUR, UROBILINOGEN, NITRITE, LEUKOCYTESUR in the last 72 hours.  Invalid input(s): APPERANCEUR    Imaging: No results found.   Medications:   . sodium chloride     . allopurinol  50 mg Oral Daily  . atorvastatin  20 mg Oral Daily  . ferrous sulfate  325 mg Oral Q breakfast  . gabapentin  300 mg Oral TID  . metoprolol succinate  50 mg Oral BID  . polyethylene glycol  17 g Oral Daily  . Rivaroxaban  15 mg Oral Q supper  . senna-docusate  1 tablet Oral BID  . sodium chloride flush  3 mL Intravenous Q12H  . torsemide  40 mg Oral Daily   sodium chloride, acetaminophen, ipratropium-albuterol, ondansetron (ZOFRAN) IV, sodium chloride flush  Assessment/ Plan:  [name] is a 86 y.o. black female with congestive heart failure, hypertension, atrial fibrillation, hyperlipidemia, history of lymphoma, who was admitted to [facility] on 06/23/2020 for Hypoxia [R09.02] Elevated TSH [R79.89] Acute CHF (congestive heart failure) (HCC)  [I50.9] Acute on chronic congestive heart failure, unspecified heart failure type ([place]) [I50.9]  1. Acute renal failure on chronic kidney disease stage IV with proteinuria : baseline creatinine of 1.75, GFR of 26 on 05/08/20.   Acute renal failure secondary to acute cardiorenal syndrome.  Creatinine 2.11 today  2. Hypotension BP readings better today, within low normal range  Home regimen of torsemide 40mg  bid  Continue Torsemide  Metoprolol held yesterday due to low BP,resuming today Continue monitoring BP closely Midodrine discontinued  3. Anemia with chronic kidney disease Hgb  11.3  Will continue monitoring CBCs  4.Chronic Atrial fibrillation:  Heart rate in 80's today Will continue Metoprolol     LOS: 6 Kristy Solomon 9/23/20211:31 PM  Patient was seen and examined with Crosby Oyster, DNP.  Discontinued midodrine. Continue torsemide 40mg  PO daily.  Above plan was discussed and agreed upon on the signing of this note.   Lavonia Dana, [facility]  9/23/20212:11 PM

## 2020-06-25 NOTE — Progress Notes (Signed)
Progress Note  Patient Name: Kristy Solomon Date of Encounter: 06/25/2020  Primary Cardiologist: Kate Sable, MD  Subjective   No chest pain or sob.  Slept well.  Chronic orthopnea, though sl better than on admission.   Inpatient Medications    Scheduled Meds: . allopurinol  50 mg Oral Daily  . atorvastatin  20 mg Oral Daily  . ferrous sulfate  325 mg Oral Q breakfast  . gabapentin  300 mg Oral TID  . metoprolol succinate  50 mg Oral BID  . polyethylene glycol  17 g Oral Daily  . Rivaroxaban  15 mg Oral Q supper  . senna-docusate  1 tablet Oral BID  . sodium chloride flush  3 mL Intravenous Q12H  . torsemide  40 mg Oral Daily   Continuous Infusions: . sodium chloride    . albumin human 25 g (06/25/20 0652)   PRN Meds: sodium chloride, acetaminophen, ipratropium-albuterol, ondansetron (ZOFRAN) IV, sodium chloride flush   Vital Signs    Vitals:   06/25/20 0054 06/25/20 0354 06/25/20 0830 06/25/20 1006  BP: 128/83 132/81 (!) 129/116 (!) 122/101  Pulse: 83 79 98 96  Resp: 16 16 14    Temp: 97.8 F (36.6 C) 98.4 F (36.9 C) (!) 97.5 F (36.4 C)   TempSrc: Oral Oral Oral   SpO2: 100% 100% 100%   Weight:  98.6 kg    Height:        Intake/Output Summary (Last 24 hours) at 06/25/2020 1025 Last data filed at 06/25/2020 1015 Gross per 24 hour  Intake 427.72 ml  Output 351 ml  Net 76.72 ml   Filed Weights   06/23/20 0409 06/24/20 0407 06/25/20 0354  Weight: 98.6 kg 99.4 kg 98.6 kg    Physical Exam   GEN: Well nourished, well developed, in no acute distress.  HEENT: Grossly normal.  Neck: Supple, JVP mod elevated, no carotid bruits, or masses. Cardiac: IR, IR, no murmurs, rubs, or gallops. No clubbing, cyanosis. 2+ bilat LE edema to posterior thighs/hips w/ bilat flank edema.  PT 1+ and equal bilaterally.  Respiratory:  Respirations regular and unlabored, bibasilar crackles. GI: Softer than earlier this admission.  Nontender, nondistended. Bilat flank  edema. BS + x 4. MS: no deformity or atrophy. Skin: warm and dry, no rash. Neuro:  Strength and sensation are intact. Psych: AAOx3.  Normal affect.  Labs    Chemistry Recent Labs  Lab 06/22/20 [phone] 06/23/20 0658 06/24/20 0700  NA 143 142 142  K 3.8 4.1 3.9  CL 109 107 107  CO2 26 24 27   GLUCOSE 135* 102* 97  BUN 43* 47* 51*  CREATININE 2.00* 2.22* 2.25*  CALCIUM 8.3* 8.3* 8.1*  ALBUMIN  --   --  2.4*  GFRNONAA 22* 19* 19*  GFRAA 25* 22* 22*  ANIONGAP 8 11 8      Hematology Recent Labs  Lab 06/23/20 0658 06/24/20 0700 06/25/20 0602  WBC 6.1 6.3 5.5  RBC 5.69* 5.23* 5.03  HGB 12.7 11.6* 11.3*  HCT 41.5 39.3 37.3  MCV 72.9* 75.1* 74.2*  MCH 22.3* 22.2* 22.5*  MCHC 30.6 29.5* 30.3  RDW 19.9* 19.6* 19.6*  PLT 106* 108* 113*    Cardiac Enzymes  Recent Labs  Lab 06/13/2020 0117 06/17/2020 0340  TROPONINIHS 86* 83*      BNP Recent Labs  Lab 06/20/20 0636  BNP 672.1*     Lipids  Lab Results  Component Value Date   CHOL 99 01/01/2020   HDL 47  01/01/2020   LDLCALC 42 01/01/2020   TRIG 52 01/01/2020   CHOLHDL 2.1 01/01/2020    HbA1c  Lab Results  Component Value Date   HGBA1C 5.8 (H) 01/01/2020    Radiology    No results found.  Telemetry    Afiib, 80's, freq PVCs w/ 5 beats NSVT - Personally Reviewed  Cardiac Studies   Echo 12/2019  1. Left ventricular ejection fraction, by estimation, is 50 to 55%. The  left ventricle has low normal function. The left ventricle has no regional  wall motion abnormalities. There is mild left ventricular hypertrophy.  Left ventricular diastolic  parameters are indeterminate.   2. Right ventricular systolic function is normal. The right ventricular  size is normal. Tricuspid regurgitation signal is inadequate for assessing  PA pressure.   3. Left atrial size was mildly dilated.   4. Right atrial size was mildly dilated.   5. The mitral valve is abnormal. Mild mitral valve regurgitation. No  evidence of  mitral stenosis.   6. Tricuspid valve regurgitation is mild to moderate.   7. The aortic valve is tricuspid. Aortic valve regurgitation is not  visualized. No aortic stenosis is present.   8. The inferior vena cava is dilated in size with <50% respiratory  variability, suggesting right atrial pressure of 15 mmHg.   Patient Profile     86 y.o. female with history of permanent atrial fibrillation, HFpEF, CKD stage IV, hypertension, lymphoma, and anemia of chronic disease who presented due to shortness of breath and volume overload in the setting of medication noncompliance.  Assessment & Plan    1.  Acute on chronic HF w/ preserved EF:  EF 50-55% by echo 3.2021.  Presented 9/17 w/ massive volume overload in the setting of noncompliance w/ home meds including torsemide and metoprolol.  She was initially diuresed w/ IV lasix and responded well but required holding in the setting of rising creat on 9/21.  Oral torsemide resumed on 9/22.  Minus 360ml overnight and minus 4.5L since admission.  Wt down 0.8,kg since yesterday. Dry wt somewhere between 90 and 93 kg - currently 98.6 kg.  Still w/ significant lower ext and gut edema.  Bibasilar crackles on exam, though she notes feeling much better compared to admission.  Labs pending this AM.  HR/BP now stable, though diastolic numbers much higher on midodrine  just d/c'd by nephrology.  2.  Permanent Afib:  Rate controlled on  blocker therapy.  Cont reduced dose xarelto @ 15mg  daily.  Will have to watch crcl closely as further reduction may make xarelto a poor choice.  She apparently did not tolerate eliquis.  3.  Acute on chronic stage IV kidney dzs:  Sl worsening w/ IV diuresis this admission.  Labs pending this AM.  Currently on oral torsemide.  4.  Hypotension:  Developed hypotension on 9/22. Midodrine started.  Pressures now stable and diastolic HTN noted.  Midodrine d/c'd this AM.  Follow on ongoing  blocker therapy, which is primarily being used  for rate-control of afib.  5.  Hypoalbuminemia:  S/ IV albumin 9/22.  Was 2.4 pre-infusion.  6.  Elevated HsTroponin: 86  83 in setting of massive volume overload.  Sometimes has chest pressure @ home.  No prior ischemic eval.  Can consider outpt myoview given demand ischemia and h/o coronary Ca2+ on prior CT.  With advanced age and CKD IV, she is a poor candidate for cath, so medical rx w/o testing, may be reasonable approach.  7.  Microcytic anemia:  Stable.  Signed, Murray Hodgkins, NP  06/25/2020, 10:25 AM    For questions or updates, please contact   Please consult [email or web address] for contact info under Cardiology/STEMI.

## 2020-06-25 NOTE — Progress Notes (Signed)
PROGRESS NOTE    Kristy Solomon  QQP:[id number] DOB: [birth year] DOA: 06/26/2020 PCP: Jodi Marble, MD   Brief Narrative:  [name] is an 86 yo AA female with PMH chronic respiratory failure (on 2L [place] O2), HFpEF, permanent afib (on Xarelto), HTN, CKDIV HLD, hx lymphoma, anemia of chronic disease who presented to the hospital with worsening SOB. She had been out of her medications at home for approx a couple weeks.  She was found to be volume overloaded and in afib with RVR. Nephrology and cardiology were consulted on admission.  A CXR was obtained which revealed bilateral pleural effusions and pulmonary edema.  She was started on IV Lasix and resumed back on her beta-blocker. She diuresed well and clinically responded to treatment with improved SOB and swelling.   Subjective: Patient was feeling better when seen today.  Denies any shortness of breath.  She was more alert and talking on phone.  Assessment & Plan:   Principal Problem:   Acute on chronic heart failure with preserved ejection fraction (HFpEF) (HCC) Active Problems:   Waldenstrom macroglobulinemia (HCC)   Thrombocytopenia (HCC)   HTN (hypertension)   GERD (gastroesophageal reflux disease)   CKD (chronic kidney disease), stage IV (HCC)   Atrial fibrillation with rapid ventricular response (HCC)   Non compliance w medication regimen   Permanent atrial fibrillation (HCC)  Acute on chronic heart failure with preserved ejection fraction (HFpEF) ([place]).  Cardiology is following-appreciate their recommendation. Patient has a net negative of 4L with a weight change of about 8 to 10 pounds. Initially treated with IV Lasix and then it was held for couple of days due to worsening renal function.  Home dose of torsemide was restarted by cardiology today. -Continue with strict intake and output and daily weight. -PT is recommending home health services-ordered.  Hypotension.  Resolved today.  Patient developed some  hypotension and appears more lethargic than her usual.  There was a huge difference between blood pressure measured on right and left upper extremities.  Right systolic mostly in the 71I with left systolic in 45Y and 09X.  No prior diagnosis of coarctation or subclavian stenosis. -Midodrine was discontinued by cardiology today.  Atrial fibrillation with rapid ventricular response ([place]).  Most likely secondary to being out of medications at home.  Currently rate well controlled. -Continue home dose of Toprol and Xarelto.  GERD (gastroesophageal reflux disease) - continue PPI  HTN (hypertension).  Blood pressure little soft today. -Holding Lasix and metoprolol and she was started on midodrine. -We will need to make sure that patient can afford meds at discharge.  AKI with CKD stage IV.  Most likely secondary to cardiorenal syndrome.  Baseline creatinine around 1.7 per nephrology. Nephrology is following-appreciate their recommendations. Home dose of torsemide was resumed today..  Creatinine remained stable. -Continue to monitor. -Avoid nephrotoxins  Thrombocytopenia ([place]).  Seems stable and improving. - patient has mild chronic thrombocytopenia at baseline ~120-140k - etiology due to her underlying Waldenstrom's macroglobulinemia and effect from ibrutinib; follows with oncology - okay to continue xarelto for now   Waldenstrom macroglobulinemia [facility]) - follows with oncology; last OV 04/23/20 - continue ibrutinib   Objective: Vitals:   06/25/20 0830 06/25/20 1006 06/25/20 1122 06/25/20 1531  BP: (!) 129/116 (!) 122/101 101/60 (!) 119/91  Pulse: 98 96 86 100  Resp: 14  17 17   Temp: (!) 97.5 F (36.4 C)  97.7 F (36.5 C) 97.6 F (36.4 C)  TempSrc: Oral  Oral  SpO2: 100%  100% 100%  Weight:      Height:        Intake/Output Summary (Last 24 hours) at 06/25/2020 1907 Last data filed at 06/25/2020 1840 Gross per 24 hour  Intake 547.72 ml  Output 300 ml  Net 247.72 ml   Filed  Weights   06/23/20 0409 06/24/20 0407 06/25/20 0354  Weight: 98.6 kg 99.4 kg 98.6 kg    Examination:  General.  Well-developed elderly lady, in no acute distress. Pulmonary.  Lungs clear bilaterally, normal respiratory effort. CV.  Irregularly irregular, no JVD, rub or murmur. Abdomen.  Soft, nontender, nondistended, BS positive. CNS.  Alert and oriented x3.  No focal neurologic deficit. Extremities.  2+ LE edema, no cyanosis, pulses intact and symmetrical. Psychiatry.  Judgment and insight appears normal.  DVT prophylaxis: Xarelto Code Status: Full Family Communication: Called grandson with no response. Disposition Plan:  Status is: Inpatient  Remains inpatient appropriate because:Inpatient level of care appropriate due to severity of illness   Dispo: The patient is from: Home              Anticipated d/c is to: Home              Anticipated d/c date is: 1 day              Patient currently is not medically stable to d/c.    Consultants:   Cardiology  Nephrology  Procedures:  Antimicrobials:   Data Reviewed: I have personally reviewed following labs and imaging studies  CBC: Recent Labs  Lab 06/21/20 0405 06/22/20 0607 06/23/20 0658 06/24/20 0700 06/25/20 0602  WBC 5.0 5.8 6.1 6.3 5.5  NEUTROABS 3.0 3.8 4.1 4.4 3.1  HGB 11.3* 11.8* 12.7 11.6* 11.3*  HCT 37.0 39.8 41.5 39.3 37.3  MCV 74.4* 75.7* 72.9* 75.1* 74.2*  PLT 86* 97* 106* 108* 353*   Basic Metabolic Panel: Recent Labs  Lab 06/21/20 0405 06/22/20 0607 06/23/20 0658 06/24/20 0700 06/25/20 0602  NA 141 143 142 142 140  K 4.1 3.8 4.1 3.9 3.9  CL 109 109 107 107 105  CO2 25 26 24 27 27   GLUCOSE 124* 135* 102* 97 103*  BUN 43* 43* 47* 51* 53*  CREATININE 2.05* 2.00* 2.22* 2.25* 2.11*  CALCIUM 8.2* 8.3* 8.3* 8.1* 8.4*  MG 2.0 2.1 2.0 2.1 2.1  PHOS  --   --   --   --  4.1   GFR: Estimated Creatinine Clearance: 21 mL/min (A) (by C-G formula based on SCr of 2.11 mg/dL (H)). Liver Function  Tests: Recent Labs  Lab 06/24/20 0700 06/25/20 0602  ALBUMIN 2.4* 3.1*   No results for input(s): LIPASE, AMYLASE in the last 168 hours. No results for input(s): AMMONIA in the last 168 hours. Coagulation Profile: No results for input(s): INR, PROTIME in the last 168 hours. Cardiac Enzymes: No results for input(s): CKTOTAL, CKMB, CKMBINDEX, TROPONINI in the last 168 hours. BNP (last 3 results) No results for input(s): PROBNP in the last 8760 hours. HbA1C: No results for input(s): HGBA1C in the last 72 hours. CBG: No results for input(s): GLUCAP in the last 168 hours. Lipid Profile: No results for input(s): CHOL, HDL, LDLCALC, TRIG, CHOLHDL, LDLDIRECT in the last 72 hours. Thyroid Function Tests: No results for input(s): TSH, T4TOTAL, FREET4, T3FREE, THYROIDAB in the last 72 hours. Anemia Panel: No results for input(s): VITAMINB12, FOLATE, FERRITIN, TIBC, IRON, RETICCTPCT in the last 72 hours. Sepsis Labs: Recent Labs  Lab 06/07/2020 0340  06/20/20 0636 06/21/20 0405 06/22/20 0607  PROCALCITON  --  <0.10 <0.10 <0.10  LATICACIDVEN 0.9  --   --   --     Recent Results (from the past 240 hour(s))  Culture, blood (routine x 2)     Status: Abnormal   Collection Time: 06/07/2020  3:40 AM   Specimen: BLOOD  Result Value Ref Range Status   Specimen Description   Final    BLOOD LEFT [facility] Performed at [facility], [address], [place], [place] [address]    Special Requests   Final    BOTTLES DRAWN AEROBIC AND ANAEROBIC Blood Culture adequate volume Performed at [facility], [address], [place], [place] [address]    Culture  Setup Time   Final    GRAM POSITIVE COCCI AEROBIC BOTTLE ONLY CRITICAL VALUE NOTED.  VALUE IS CONSISTENT WITH PREVIOUSLY REPORTED AND CALLED VALUE. Performed at [facility], [address] [place], [place] [address]    Culture (A)  Final    STAPHYLOCOCCUS CAPITIS STAPHYLOCOCCUS EPIDERMIDIS    Report Status  06/23/2020 FINAL  Final   Organism ID, Bacteria STAPHYLOCOCCUS EPIDERMIDIS  Final      Susceptibility   Staphylococcus epidermidis - MIC*    CIPROFLOXACIN <=0.5 SENSITIVE Sensitive     ERYTHROMYCIN >=8 RESISTANT Resistant     GENTAMICIN <=0.5 SENSITIVE Sensitive     OXACILLIN <=0.25 SENSITIVE Sensitive     TETRACYCLINE <=1 SENSITIVE Sensitive     VANCOMYCIN 1 SENSITIVE Sensitive     TRIMETH/SULFA <=10 SENSITIVE Sensitive     CLINDAMYCIN <=0.25 SENSITIVE Sensitive     RIFAMPIN <=0.5 SENSITIVE Sensitive     Inducible Clindamycin NEGATIVE Sensitive     * STAPHYLOCOCCUS EPIDERMIDIS  Culture, blood (routine x 2)     Status: Abnormal   Collection Time: 06/16/2020  3:40 AM   Specimen: BLOOD  Result Value Ref Range Status   Specimen Description   Final    BLOOD RIGHT FA Performed at [facility], [address], [place], [place] [address]    Special Requests   Final    BOTTLES DRAWN AEROBIC AND ANAEROBIC Blood Culture adequate volume Performed at [facility], [address] [place], [place] [address]    Culture  Setup Time   Final    GRAM POSITIVE COCCI IN BOTH AEROBIC AND ANAEROBIC BOTTLES CRITICAL RESULT CALLED TO, READ BACK BY AND VERIFIED WITH: SUSAN WATSON AT 3007 06/18/2020.PMF    Culture (A)  Final    STAPHYLOCOCCUS HOMINIS STAPHYLOCOCCUS EPIDERMIDIS THE SIGNIFICANCE OF ISOLATING THIS ORGANISM FROM A SINGLE SET OF BLOOD CULTURES WHEN MULTIPLE SETS ARE DRAWN IS UNCERTAIN. PLEASE NOTIFY THE MICROBIOLOGY DEPARTMENT WITHIN ONE WEEK IF SPECIATION AND SENSITIVITIES ARE REQUIRED. Performed at [facility], [place] [address] [place], [place] [address]    Report Status 06/23/2020 FINAL  Final  Blood Culture ID Panel (Reflexed)     Status: Abnormal   Collection Time: 06/08/2020  3:40 AM  Result Value Ref Range Status   Enterococcus faecalis NOT DETECTED NOT DETECTED Final   Enterococcus Faecium NOT DETECTED NOT DETECTED Final   Listeria monocytogenes NOT DETECTED  NOT DETECTED Final   Staphylococcus species DETECTED (A) NOT DETECTED Final    Comment: CRITICAL RESULT CALLED TO, READ BACK BY AND VERIFIED WITH: SUSAN WATSON AT 1833 06/06/2020.PMF    Staphylococcus aureus (BCID) NOT DETECTED NOT DETECTED Final   Staphylococcus epidermidis DETECTED (A) NOT DETECTED Final    Comment: Methicillin (oxacillin)  resistant coagulase negative staphylococcus. Possible blood culture contaminant (unless isolated from more than one blood culture draw or clinical case suggests pathogenicity). No antibiotic treatment is indicated for blood  culture contaminants. CRITICAL RESULT CALLED TO, READ BACK BY AND VERIFIED WITH: SUSAN WATSON AT 3875 06/25/2020.PMF    Staphylococcus lugdunensis NOT DETECTED NOT DETECTED Final   Streptococcus species NOT DETECTED NOT DETECTED Final   Streptococcus agalactiae NOT DETECTED NOT DETECTED Final   Streptococcus pneumoniae NOT DETECTED NOT DETECTED Final   Streptococcus pyogenes NOT DETECTED NOT DETECTED Final   A.calcoaceticus-baumannii NOT DETECTED NOT DETECTED Final   Bacteroides fragilis NOT DETECTED NOT DETECTED Final   Enterobacterales NOT DETECTED NOT DETECTED Final   Enterobacter cloacae complex NOT DETECTED NOT DETECTED Final   Escherichia coli NOT DETECTED NOT DETECTED Final   Klebsiella aerogenes NOT DETECTED NOT DETECTED Final   Klebsiella oxytoca NOT DETECTED NOT DETECTED Final   Klebsiella pneumoniae NOT DETECTED NOT DETECTED Final   Proteus species NOT DETECTED NOT DETECTED Final   Salmonella species NOT DETECTED NOT DETECTED Final   Serratia marcescens NOT DETECTED NOT DETECTED Final   Haemophilus influenzae NOT DETECTED NOT DETECTED Final   Neisseria meningitidis NOT DETECTED NOT DETECTED Final   Pseudomonas aeruginosa NOT DETECTED NOT DETECTED Final   Stenotrophomonas maltophilia NOT DETECTED NOT DETECTED Final   Candida albicans NOT DETECTED NOT DETECTED Final   Candida auris NOT DETECTED NOT DETECTED Final    Candida glabrata NOT DETECTED NOT DETECTED Final   Candida krusei NOT DETECTED NOT DETECTED Final   Candida parapsilosis NOT DETECTED NOT DETECTED Final   Candida tropicalis NOT DETECTED NOT DETECTED Final   Cryptococcus neoformans/gattii NOT DETECTED NOT DETECTED Final   Methicillin resistance mecA/C DETECTED (A) NOT DETECTED Final    Comment: CRITICAL RESULT CALLED TO, READ BACK BY AND VERIFIED WITH: SUSAN WATSON AT 1833 06/25/2020.PMF Performed at [facility], [address] [place], [place] [address]   SARS Coronavirus 2 by RT PCR (hospital order, performed in [facility] hospital lab) Nasopharyngeal Nasopharyngeal Swab     Status: None   Collection Time: 06/20/2020 12:01 PM   Specimen: Nasopharyngeal Swab  Result Value Ref Range Status   SARS Coronavirus 2 NEGATIVE NEGATIVE Final    Comment: (NOTE) SARS-CoV-2 target nucleic acids are NOT DETECTED.  The SARS-CoV-2 RNA is generally detectable in upper and lower respiratory specimens during the acute phase of infection. The lowest concentration of SARS-CoV-2 viral copies this assay can detect is 250 copies / mL. A negative result does not preclude SARS-CoV-2 infection and should not be used as the sole basis for treatment or other patient management decisions.  A negative result may occur with improper specimen collection / handling, submission of specimen other than nasopharyngeal swab, presence of viral mutation(s) within the areas targeted by this assay, and inadequate number of viral copies (<250 copies / mL). A negative result must be combined with clinical observations, patient history, and epidemiological information.  Fact Sheet for Patients:   StrictlyIdeas.no  Fact Sheet for Healthcare Providers: BankingDealers.co.za  This test is not yet approved or  cleared by the [place] FDA and has been authorized for detection and/or diagnosis of SARS-CoV-2 by FDA under  an Emergency Use Authorization (EUA).  This EUA will remain in effect (meaning this test can be used) for the duration of the COVID-19 declaration under Section 564(b)(1) of the Act, 21 U.S.C. section 360bbb-3(b)(1), unless the authorization is terminated or revoked sooner.  Performed at [facility], [place], [place] [address]   CULTURE, BLOOD (ROUTINE X 2) w Reflex to ID Panel     Status: None   Collection Time: 06/20/20  5:34 PM   Specimen: BLOOD  Result Value Ref Range Status   Specimen Description BLOOD [facility]  Final   Special Requests   Final    BOTTLES DRAWN AEROBIC AND ANAEROBIC Blood Culture adequate volume   Culture   Final    NO GROWTH 5 DAYS Performed at [facility], [address] [place], [place] [address]    Report Status 06/25/2020 FINAL  Final  CULTURE, BLOOD (ROUTINE X 2) w Reflex to ID Panel     Status: None   Collection Time: 06/20/20  5:34 PM   Specimen: BLOOD  Result Value Ref Range Status   Specimen Description BLOOD RAC  Final   Special Requests   Final    BOTTLES DRAWN AEROBIC AND ANAEROBIC Blood Culture adequate volume   Culture   Final    NO GROWTH 5 DAYS Performed at [facility], [address], [place], [place] [address]    Report Status 06/25/2020 FINAL  Final     Radiology Studies: No results found.  Scheduled Meds: . allopurinol  50 mg Oral Daily  . atorvastatin  20 mg Oral Daily  . ferrous sulfate  325 mg Oral Q breakfast  . gabapentin  300 mg Oral TID  . metoprolol succinate  50 mg Oral BID  . polyethylene glycol  17 g Oral Daily  . Rivaroxaban  15 mg Oral Q supper  . senna-docusate  1 tablet Oral BID  . sodium chloride flush  3 mL Intravenous Q12H  . torsemide  40 mg Oral Daily   Continuous Infusions: . sodium chloride       LOS: 6 days   Time spent: 30 minutes.  Lorella Nimrod, MD [facility]  If 7PM-7AM, please contact night-coverage [email or web address]  06/25/2020, 7:07 PM    This record has been created using Systems analyst. Errors have been sought and corrected,but may not always be located. Such creation errors do not reflect on the standard of care.

## 2020-06-26 DIAGNOSIS — C880 Waldenstrom macroglobulinemia: Secondary | ICD-10-CM

## 2020-06-26 LAB — RENAL FUNCTION PANEL
Albumin: 3 g/dL — ABNORMAL LOW (ref 3.5–5.0)
Anion gap: 9 (ref 5–15)
BUN: 52 mg/dL — ABNORMAL HIGH (ref 8–23)
CO2: 27 mmol/L (ref 22–32)
Calcium: 8.5 mg/dL — ABNORMAL LOW (ref 8.9–10.3)
Chloride: 106 mmol/L (ref 98–111)
Creatinine, Ser: 2.26 mg/dL — ABNORMAL HIGH (ref 0.44–1.00)
GFR calc Af Amer: 22 mL/min — ABNORMAL LOW (ref >60)
GFR calc non Af Amer: 19 mL/min — ABNORMAL LOW (ref >60)
Glucose, Bld: 99 mg/dL (ref 70–99)
Phosphorus: 4 mg/dL (ref 2.5–4.6)
Potassium: 3.6 mmol/L (ref 3.5–5.1)
Sodium: 142 mmol/L (ref 135–145)

## 2020-06-26 MED ORDER — METOPROLOL SUCCINATE ER 100 MG PO TB24: 100.0000 mg | ORAL_TABLET | Freq: Every day | ORAL | Status: DC

## 2020-06-26 MED ORDER — MIDODRINE HCL 5 MG PO TABS
10.0000 mg | ORAL_TABLET | Freq: Once | ORAL | Status: AC
  Administered 2020-06-26: 10 mg via ORAL
  Filled 2020-06-26: qty 2

## 2020-06-26 NOTE — Progress Notes (Signed)
Occupational Therapy Treatment Patient Details Name: Kristy Solomon MRN: [id number] DOB: Oct 24, 1928 Today's Date: 06/26/2020    History of present illness Kristy Solomon is an 86 y/o female who was admitted for worsening SOB with an increase in O2 to 3L due to symptoms. In ED, pt was tachycardic and in rapid A-fib w/ HR to 170. PMH includes chronic respiratory failure on 2L O2 continuous, chrinic diastolic dysfunction, A-fib on chronic anticoagulation therapy, HTN with complications of CKD III-IV, covid-19 virus infection April 2021, and history of malignant lymphoma.   OT comments  Upon entering the room, pt seated in recliner chair with BM running down LEs and chair. Pt is unaware of incontinence. Pt standing from recliner chair with min A and maintaining standing balance for ~ 10 minutes for hygiene with min guard. Once pt was standing she then began to urinate. Pt encouraged to attempt hygiene herself in which she was able but needing assistance for thoroughness. Pt then transferred to sit in chair with min guard and needing assistance to doff soiled socks and hygiene for B LEs. Pt changed gown with set up A and remained in chair for PT session. All needs within reach. Pt continues to benefit from OT intervention.   Follow Up Recommendations  Home health OT;Supervision - Intermittent    Equipment Recommendations  3 in 1 bedside commode       Precautions / Restrictions Precautions Precautions: Fall Restrictions Weight Bearing Restrictions: No       Mobility Bed Mobility      General bed mobility comments: seated in recliner chair upon entering the room  Transfers Overall transfer level: Needs assistance Equipment used: Rolling walker (2 wheeled) Transfers: Sit to/from Stand Sit to Stand: Min guard;Min assist         General transfer comment: Min A sit <>stand from recliner chair with min guard for standing balance    Balance Overall balance assessment: Needs  assistance Sitting-balance support: Feet supported Sitting balance-Leahy Scale: Good Sitting balance - Comments: no overt LOB noted   Standing balance support: Bilateral upper extremity supported Standing balance-Leahy Scale: Fair Standing balance comment: reliance on RW       ADL either performed or assessed with clinical judgement   ADL Overall ADL's : Needs assistance/impaired    General ADL Comments: Pt incontinent of bowels and bladder this session with min guard for standing balance and mod A for hygiene. Pt changing hospital gown with set up A and total A to don clean socks. OT placed incontience brief on pt with total A.     Vision Baseline Vision/History: Wears glasses Patient Visual Report: No change from baseline            Cognition Arousal/Alertness: Awake/alert Behavior During Therapy: WFL for tasks assessed/performed Overall Cognitive Status: Within Functional Limits for tasks assessed          Exercises Exercises: Other exercises Other Exercises Other Exercises: BLE theres in sitting: hip ab/add and resisted leg press x 10, heel slides with min A x 10           Pertinent Vitals/ Pain       Pain Assessment: No/denies pain         Frequency  Min 1X/week        Progress Toward Goals  OT Goals(current goals can now be found in the care plan section)  Progress towards OT goals: Progressing toward goals  Acute Rehab OT Goals Patient Stated Goal: to get cleaned up OT  Goal Formulation: With patient Time For Goal Achievement: 07/27/2020 Potential to Achieve Goals: Good  Plan Discharge plan remains appropriate       AM-PAC OT "6 Clicks" Daily Activity     Outcome Measure   Help from another person eating meals?: None Help from another person taking care of personal grooming?: A Little Help from another person toileting, which includes using toliet, bedpan, or urinal?: A Lot Help from another person bathing (including washing, rinsing,  drying)?: A Lot Help from another person to put on and taking off regular upper body clothing?: A Little Help from another person to put on and taking off regular lower body clothing?: A Lot 6 Click Score: 16    End of Session Equipment Utilized During Treatment: Gait belt;Rolling walker;Oxygen  OT Visit Diagnosis: Unsteadiness on feet (R26.81);Muscle weakness (generalized) (M62.81)   Activity Tolerance Patient tolerated treatment well   Patient Left in chair;with call bell/phone within reach;with chair alarm set   Nurse Communication Mobility status        Time: 4765-4650 OT Time Calculation (min): 25 min  Charges: OT General Charges $OT Visit: 1 Visit OT Treatments $Self Care/Home Management : 23-37 mins  Darleen Crocker, MS, OTR/L , CBIS ascom [phone]  06/26/20, 12:35 PM

## 2020-06-26 NOTE — Progress Notes (Signed)
Mobility Specialist - Progress Note   06/26/20 1300  Mobility  Activity  (Chair exercises)  Range of Motion/Exercises All extremities  Level of Assistance Minimal assist, patient does 75% or more  Assistive Device None  Distance Ambulated (ft) 0 ft  Mobility Response Tolerated well  Mobility performed by Mobility specialist  $Mobility charge 1 Mobility    Pre-mobility: 92 HR, 94/49 BP, 100% SpO2 Post-mobility: 81 HR, 99/59 BP, 100% SpO2   Pt was sitting in recliner upon arrival. Pt agreed to session. Pt denied any pain, dizziness, or fatigue. Pt declined OOB activity as she stated she had just finished walking with PT. Pt performed chair exercises: straight leg raises, ankle pumps (10x/leg), and flex/ext (10x/arm). Further mobility was limited d/t pt falling in and out of sleep throughout session. Overall, pt tolerated session well. Pt remains in chair with all needs in reach. Nurse entered room at the end of session and was notified of performance.   Kathee Delton Mobility Specialist 06/26/20, 2:02 PM

## 2020-06-26 NOTE — ED Provider Notes (Signed)
Critical care time for this patient on her date of service is 06/03/2020 was 20 minutes this involved time spent at the bedside as well as time reviewing her old records and speaking with the hospital doctor.   Nena Polio, MD 06/26/20 [phone]

## 2020-06-26 NOTE — Progress Notes (Signed)
PROGRESS NOTE    Kristy Solomon  BTD:[id number] DOB: 1929/07/16 DOA: 06/24/2020 PCP: Jodi Marble, MD   Brief Narrative:  Kristy Solomon is an 86 yo AA female with PMH chronic respiratory failure (on 2L [place] O2), HFpEF, permanent afib (on Xarelto), HTN, CKDIV HLD, hx lymphoma, anemia of chronic disease who presented to the hospital with worsening SOB. She had been out of her medications at home for approx a couple weeks.  She was found to be volume overloaded and in afib with RVR. Nephrology and cardiology were consulted on admission.  A CXR was obtained which revealed bilateral pleural effusions and pulmonary edema.  She was started on IV Lasix and resumed back on her beta-blocker. She diuresed well and clinically responded to treatment with improved SOB and swelling.   Subjective: Patient was sitting in chair and having her breakfast when seen today.  No new complaint.  She was having some pain in her left thigh with certain movements.  No pain when remain stationary or with sitting or lying down.  Assessment & Plan:   Principal Problem:   Acute on chronic heart failure with preserved ejection fraction (HFpEF) (HCC) Active Problems:   Waldenstrom macroglobulinemia (HCC)   Thrombocytopenia (HCC)   HTN (hypertension)   GERD (gastroesophageal reflux disease)   CKD (chronic kidney disease), stage IV (HCC)   Atrial fibrillation with rapid ventricular response (HCC)   Non compliance w medication regimen   Permanent atrial fibrillation (HCC)  Acute on chronic heart failure with preserved ejection fraction (HFpEF) ([place]).  Cardiology is following-appreciate their recommendation. Patient has a net negative of 4L with a weight change of about 8 to 10 pounds. Initially treated with IV Lasix and then it was held for couple of days due to worsening renal function.  Home dose of torsemide was restarted by cardiology .  Metoprolol was also added back today. -Continue with strict intake and  output and daily weight. -PT is recommending home health services-ordered.  Hypotension.  Resolved .  Patient developed some hypotension and appears more lethargic than her usual.  There was a huge difference between blood pressure measured on right and left upper extremities.  Right systolic mostly in the 36I with left systolic in 68E and 32Z.  No prior diagnosis of coarctation or subclavian stenosis. -Midodrine was discontinued by cardiology today.  Atrial fibrillation with rapid ventricular response ([place]).  Most likely secondary to being out of medications at home.  Currently rate well controlled. -Continue home dose of Toprol and Xarelto.  GERD (gastroesophageal reflux disease) - continue PPI  HTN (hypertension).  Blood pressure little soft today. -Giving her another dose of midodrine and will continue with torsemide. -We will need to make sure that patient can afford meds at discharge.  AKI with CKD stage IV.  Most likely secondary to cardiorenal syndrome.  Baseline creatinine around 1.7 per nephrology. Nephrology is following-appreciate their recommendations. Home dose of torsemide was resumed .  Creatinine remained stable. -Continue to monitor. -Avoid nephrotoxins  Thrombocytopenia ([place]).  Seems stable and improving. - patient has mild chronic thrombocytopenia at baseline ~120-140k - etiology due to her underlying Waldenstrom's macroglobulinemia and effect from ibrutinib; follows with oncology - okay to continue xarelto for now   Waldenstrom macroglobulinemia [facility]) - follows with oncology; last OV 04/23/20 - continue ibrutinib   Objective: Vitals:   06/26/20 0751 06/26/20 1159 06/26/20 1428 06/26/20 1603  BP: (!) 117/94 (!) 88/53 (!) 101/58 102/70  Pulse: 92 88  88  Resp:  18 19  17   Temp: 97.7 F (36.5 C) 98.2 F (36.8 C)  98.1 F (36.7 C)  TempSrc: Oral Oral  Oral  SpO2: 100% 100%  100%  Weight:      Height:        Intake/Output Summary (Last 24 hours) at  06/26/2020 1636 Last data filed at 06/25/2020 1840 Gross per 24 hour  Intake 240 ml  Output --  Net 240 ml   Filed Weights   06/24/20 0407 06/25/20 0354 06/26/20 0520  Weight: 99.4 kg 98.6 kg 99.7 kg    Examination:  General.  Elderly lady, sitting in chair comfortably, in no acute distress. Pulmonary.  Few basal crackles bilaterally, normal respiratory effort. CV.  Regular rate and rhythm, no JVD, rub or murmur. Abdomen.  Soft, nontender, nondistended, BS positive. CNS.  Alert and oriented x3.  No focal neurologic deficit. Extremities.  2+ LE edema, no cyanosis, pulses intact and symmetrical. Psychiatry.  Judgment and insight appears normal.  DVT prophylaxis: Xarelto Code Status: Full Family Communication: Discussed with patient Disposition Plan:  Status is: Inpatient  Remains inpatient appropriate because:Inpatient level of care appropriate due to severity of illness   Dispo: The patient is from: Home              Anticipated d/c is to: Home              Anticipated d/c date is: 1 day              Patient currently is not medically stable to d/c.  Patient is high risk for readmission.  Remains volume overload.  History of being noncompliant.  Consultants:   Cardiology  Nephrology  Procedures:  Antimicrobials:   Data Reviewed: I have personally reviewed following labs and imaging studies  CBC: Recent Labs  Lab 06/21/20 0405 06/22/20 0607 06/23/20 0658 06/24/20 0700 06/25/20 0602  WBC 5.0 5.8 6.1 6.3 5.5  NEUTROABS 3.0 3.8 4.1 4.4 3.1  HGB 11.3* 11.8* 12.7 11.6* 11.3*  HCT 37.0 39.8 41.5 39.3 37.3  MCV 74.4* 75.7* 72.9* 75.1* 74.2*  PLT 86* 97* 106* 108* 597*   Basic Metabolic Panel: Recent Labs  Lab 06/21/20 0405 06/21/20 0405 06/22/20 0607 06/23/20 0658 06/24/20 0700 06/25/20 0602 06/26/20 0554  NA 141   < > 143 142 142 140 142  K 4.1   < > 3.8 4.1 3.9 3.9 3.6  CL 109   < > 109 107 107 105 106  CO2 25   < > 26 24 27 27 27   GLUCOSE 124*   < >  135* 102* 97 103* 99  BUN 43*   < > 43* 47* 51* 53* 52*  CREATININE 2.05*   < > 2.00* 2.22* 2.25* 2.11* 2.26*  CALCIUM 8.2*   < > 8.3* 8.3* 8.1* 8.4* 8.5*  MG 2.0  --  2.1 2.0 2.1 2.1  --   PHOS  --   --   --   --   --  4.1 4.0   < > = values in this interval not displayed.   GFR: Estimated Creatinine Clearance: 19.7 mL/min (A) (by C-G formula based on SCr of 2.26 mg/dL (H)). Liver Function Tests: Recent Labs  Lab 06/24/20 0700 06/25/20 0602 06/26/20 0554  ALBUMIN 2.4* 3.1* 3.0*   No results for input(s): LIPASE, AMYLASE in the last 168 hours. No results for input(s): AMMONIA in the last 168 hours. Coagulation Profile: No results for input(s): INR, PROTIME in the last 168  hours. Cardiac Enzymes: No results for input(s): CKTOTAL, CKMB, CKMBINDEX, TROPONINI in the last 168 hours. BNP (last 3 results) No results for input(s): PROBNP in the last 8760 hours. HbA1C: No results for input(s): HGBA1C in the last 72 hours. CBG: No results for input(s): GLUCAP in the last 168 hours. Lipid Profile: No results for input(s): CHOL, HDL, LDLCALC, TRIG, CHOLHDL, LDLDIRECT in the last 72 hours. Thyroid Function Tests: No results for input(s): TSH, T4TOTAL, FREET4, T3FREE, THYROIDAB in the last 72 hours. Anemia Panel: No results for input(s): VITAMINB12, FOLATE, FERRITIN, TIBC, IRON, RETICCTPCT in the last 72 hours. Sepsis Labs: Recent Labs  Lab 06/20/20 0636 06/21/20 0405 06/22/20 0607  PROCALCITON <0.10 <0.10 <0.10    Recent Results (from the past 240 hour(s))  Culture, blood (routine x 2)     Status: Abnormal   Collection Time: 06/27/2020  3:40 AM   Specimen: BLOOD  Result Value Ref Range Status   Specimen Description   Final    BLOOD LEFT [facility] Performed at [facility], [address], [place], [place] [address]    Special Requests   Final    BOTTLES DRAWN AEROBIC AND ANAEROBIC Blood Culture adequate volume Performed at [facility], [address]  [place], [place] [address]    Culture  Setup Time   Final    GRAM POSITIVE COCCI AEROBIC BOTTLE ONLY CRITICAL VALUE NOTED.  VALUE IS CONSISTENT WITH PREVIOUSLY REPORTED AND CALLED VALUE. Performed at [facility], [address] [place], [place] [address]    Culture (A)  Final    STAPHYLOCOCCUS CAPITIS STAPHYLOCOCCUS EPIDERMIDIS    Report Status 06/23/2020 FINAL  Final   Organism ID, Bacteria STAPHYLOCOCCUS EPIDERMIDIS  Final      Susceptibility   Staphylococcus epidermidis - MIC*    CIPROFLOXACIN <=0.5 SENSITIVE Sensitive     ERYTHROMYCIN >=8 RESISTANT Resistant     GENTAMICIN <=0.5 SENSITIVE Sensitive     OXACILLIN <=0.25 SENSITIVE Sensitive     TETRACYCLINE <=1 SENSITIVE Sensitive     VANCOMYCIN 1 SENSITIVE Sensitive     TRIMETH/SULFA <=10 SENSITIVE Sensitive     CLINDAMYCIN <=0.25 SENSITIVE Sensitive     RIFAMPIN <=0.5 SENSITIVE Sensitive     Inducible Clindamycin NEGATIVE Sensitive     * STAPHYLOCOCCUS EPIDERMIDIS  Culture, blood (routine x 2)     Status: Abnormal   Collection Time: 06/07/2020  3:40 AM   Specimen: BLOOD  Result Value Ref Range Status   Specimen Description   Final    BLOOD RIGHT FA Performed at [facility], [address], [place], [place] [address]    Special Requests   Final    BOTTLES DRAWN AEROBIC AND ANAEROBIC Blood Culture adequate volume Performed at [facility], [address] [place], [place] [address]    Culture  Setup Time   Final    GRAM POSITIVE COCCI IN BOTH AEROBIC AND ANAEROBIC BOTTLES CRITICAL RESULT CALLED TO, READ BACK BY AND VERIFIED WITH: SUSAN WATSON AT 1308 06/18/2020.PMF    Culture (A)  Final    STAPHYLOCOCCUS HOMINIS STAPHYLOCOCCUS EPIDERMIDIS THE SIGNIFICANCE OF ISOLATING THIS ORGANISM FROM A SINGLE SET OF BLOOD CULTURES WHEN MULTIPLE SETS ARE DRAWN IS UNCERTAIN. PLEASE NOTIFY THE MICROBIOLOGY DEPARTMENT WITHIN ONE WEEK IF SPECIATION AND SENSITIVITIES ARE REQUIRED. Performed at [place], [place] [address], [place], [place] [address]    Report Status 06/23/2020 FINAL  Final  Blood Culture ID Panel (Reflexed)     Status: Abnormal  Collection Time: 06/20/2020  3:40 AM  Result Value Ref Range Status   Enterococcus faecalis NOT DETECTED NOT DETECTED Final   Enterococcus Faecium NOT DETECTED NOT DETECTED Final   Listeria monocytogenes NOT DETECTED NOT DETECTED Final   Staphylococcus species DETECTED (A) NOT DETECTED Final    Comment: CRITICAL RESULT CALLED TO, READ BACK BY AND VERIFIED WITH: SUSAN WATSON AT 1833 06/16/2020.PMF    Staphylococcus aureus (BCID) NOT DETECTED NOT DETECTED Final   Staphylococcus epidermidis DETECTED (A) NOT DETECTED Final    Comment: Methicillin (oxacillin) resistant coagulase negative staphylococcus. Possible blood culture contaminant (unless isolated from more than one blood culture draw or clinical case suggests pathogenicity). No antibiotic treatment is indicated for blood  culture contaminants. CRITICAL RESULT CALLED TO, READ BACK BY AND VERIFIED WITH: SUSAN WATSON AT 4496 06/29/2020.PMF    Staphylococcus lugdunensis NOT DETECTED NOT DETECTED Final   Streptococcus species NOT DETECTED NOT DETECTED Final   Streptococcus agalactiae NOT DETECTED NOT DETECTED Final   Streptococcus pneumoniae NOT DETECTED NOT DETECTED Final   Streptococcus pyogenes NOT DETECTED NOT DETECTED Final   A.calcoaceticus-baumannii NOT DETECTED NOT DETECTED Final   Bacteroides fragilis NOT DETECTED NOT DETECTED Final   Enterobacterales NOT DETECTED NOT DETECTED Final   Enterobacter cloacae complex NOT DETECTED NOT DETECTED Final   Escherichia coli NOT DETECTED NOT DETECTED Final   Klebsiella aerogenes NOT DETECTED NOT DETECTED Final   Klebsiella oxytoca NOT DETECTED NOT DETECTED Final   Klebsiella pneumoniae NOT DETECTED NOT DETECTED Final   Proteus species NOT DETECTED NOT DETECTED Final   Salmonella species NOT DETECTED NOT DETECTED Final   Serratia marcescens NOT DETECTED  NOT DETECTED Final   Haemophilus influenzae NOT DETECTED NOT DETECTED Final   Neisseria meningitidis NOT DETECTED NOT DETECTED Final   Pseudomonas aeruginosa NOT DETECTED NOT DETECTED Final   Stenotrophomonas maltophilia NOT DETECTED NOT DETECTED Final   Candida albicans NOT DETECTED NOT DETECTED Final   Candida auris NOT DETECTED NOT DETECTED Final   Candida glabrata NOT DETECTED NOT DETECTED Final   Candida krusei NOT DETECTED NOT DETECTED Final   Candida parapsilosis NOT DETECTED NOT DETECTED Final   Candida tropicalis NOT DETECTED NOT DETECTED Final   Cryptococcus neoformans/gattii NOT DETECTED NOT DETECTED Final   Methicillin resistance mecA/C DETECTED (A) NOT DETECTED Final    Comment: CRITICAL RESULT CALLED TO, READ BACK BY AND VERIFIED WITH: SUSAN WATSON AT 1833 07/01/2020.PMF Performed at [facility], [address] [place], [place] [address]   SARS Coronavirus 2 by RT PCR (hospital order, performed in [facility] hospital lab) Nasopharyngeal Nasopharyngeal Swab     Status: None   Collection Time: 06/03/2020 12:01 PM   Specimen: Nasopharyngeal Swab  Result Value Ref Range Status   SARS Coronavirus 2 NEGATIVE NEGATIVE Final    Comment: (NOTE) SARS-CoV-2 target nucleic acids are NOT DETECTED.  The SARS-CoV-2 RNA is generally detectable in upper and lower respiratory specimens during the acute phase of infection. The lowest concentration of SARS-CoV-2 viral copies this assay can detect is 250 copies / mL. A negative result does not preclude SARS-CoV-2 infection and should not be used as the sole basis for treatment or other patient management decisions.  A negative result may occur with improper specimen collection / handling, submission of specimen other than nasopharyngeal swab, presence of viral mutation(s) within the areas targeted by this assay, and inadequate number of viral copies (<250 copies / mL). A negative result must be combined with clinical observations,  patient history, and  epidemiological information.  Fact Sheet for Patients:   StrictlyIdeas.no  Fact Sheet for Healthcare Providers: BankingDealers.co.za  This test is not yet approved or  cleared by the [place] FDA and has been authorized for detection and/or diagnosis of SARS-CoV-2 by FDA under an Emergency Use Authorization (EUA).  This EUA will remain in effect (meaning this test can be used) for the duration of the COVID-19 declaration under Section 564(b)(1) of the Act, 21 U.S.C. section 360bbb-3(b)(1), unless the authorization is terminated or revoked sooner.  Performed at [facility], [address] [place], [place] [address]   CULTURE, BLOOD (ROUTINE X 2) w Reflex to ID Panel     Status: None   Collection Time: 06/20/20  5:34 PM   Specimen: BLOOD  Result Value Ref Range Status   Specimen Description BLOOD [facility]  Final   Special Requests   Final    BOTTLES DRAWN AEROBIC AND ANAEROBIC Blood Culture adequate volume   Culture   Final    NO GROWTH 5 DAYS Performed at [facility], [address] [place], [place] [address]    Report Status 06/25/2020 FINAL  Final  CULTURE, BLOOD (ROUTINE X 2) w Reflex to ID Panel     Status: None   Collection Time: 06/20/20  5:34 PM   Specimen: BLOOD  Result Value Ref Range Status   Specimen Description BLOOD [facility]  Final   Special Requests   Final    BOTTLES DRAWN AEROBIC AND ANAEROBIC Blood Culture adequate volume   Culture   Final    NO GROWTH 5 DAYS Performed at [facility], [address] [place], [place] [address]    Report Status 06/25/2020 FINAL  Final     Radiology Studies: No results found.  Scheduled Meds: . allopurinol  50 mg Oral Daily  . atorvastatin  20 mg Oral Daily  . ferrous sulfate  325 mg Oral Q breakfast  . gabapentin  300 mg Oral TID  . [START ON 06/27/2020] metoprolol succinate  100 mg Oral Daily  . polyethylene glycol  17  g Oral Daily  . Rivaroxaban  15 mg Oral Q supper  . senna-docusate  1 tablet Oral BID  . sodium chloride flush  3 mL Intravenous Q12H  . torsemide  40 mg Oral Daily   Continuous Infusions: . sodium chloride       LOS: 7 days   Time spent: 30 minutes.  Lorella Nimrod, MD [facility]  If 7PM-7AM, please contact night-coverage [email or web address]  06/26/2020, 4:36 PM   This record has been created using Dragon voice recognition software. Errors have been sought and corrected,but may not always be located. Such creation errors do not reflect on the standard of care.

## 2020-06-26 NOTE — Progress Notes (Signed)
Pts BP 88/53 - pt asymptomatic/ MD made aware/ will monitor.

## 2020-06-26 NOTE — Progress Notes (Signed)
[facility]  ROUNDING NOTE   Subjective:   Sitting up in chair eating breakfast when examined   Objective:  Vital signs in last 24 hours:  Temp:  [97.6 F (36.4 C)-99 F (37.2 C)] 97.7 F (36.5 C) (09/24 0751) Pulse Rate:  [58-100] 92 (09/24 0751) Resp:  [16-18] 18 (09/24 0751) BP: (103-128)/(58-94) 117/94 (09/24 0751) SpO2:  [97 %-100 %] 100 % (09/24 0751) Weight:  [99.7 kg] 99.7 kg (09/24 0520)  Weight change: 1.089 kg Filed Weights   06/24/20 0407 06/25/20 0354 06/26/20 0520  Weight: 99.4 kg 98.6 kg 99.7 kg    Intake/Output: I/O last 3 completed shifts: In: 547.7 [P.O.:480; I.V.:3; IV Piggyback:64.7] Out: 300 [Urine:300]   Intake/Output this shift:  No intake/output data recorded.  Physical Exam: General: In no acute distress  Head:  Moist oral mucosal membranes  Eyes: Sclerae and conjunctivae clear  Neck: Supple, trachea midline  Lungs:  Diminished at the bases  Heart: Irregular rhythm  Abdomen:  Soft, nontender, obese  Extremities:  1+ peripheral edema.  Neurologic: Awake,alert, answers simple questions appropriately  Skin: No acute lesions or rashes        Basic Metabolic Panel: Recent Labs  Lab 06/21/20 0405 06/21/20 0405 06/22/20 0607 06/22/20 0607 06/23/20 0658 06/23/20 0658 06/24/20 0700 06/25/20 0602 06/26/20 0554  NA 141   < > 143  --  142  --  142 140 142  K 4.1   < > 3.8  --  4.1  --  3.9 3.9 3.6  CL 109   < > 109  --  107  --  107 105 106  CO2 25   < > 26  --  24  --  27 27 27   GLUCOSE 124*   < > 135*  --  102*  --  97 103* 99  BUN 43*   < > 43*  --  47*  --  51* 53* 52*  CREATININE 2.05*   < > 2.00*  --  2.22*  --  2.25* 2.11* 2.26*  CALCIUM 8.2*   < > 8.3*   < > 8.3*   < > 8.1* 8.4* 8.5*  MG 2.0  --  2.1  --  2.0  --  2.1 2.1  --   PHOS  --   --   --   --   --   --   --  4.1 4.0   < > = values in this interval not displayed.    Liver Function Tests: Recent Labs  Lab 06/24/20 0700 06/25/20 0602 06/26/20 0554   ALBUMIN 2.4* 3.1* 3.0*   No results for input(s): LIPASE, AMYLASE in the last 168 hours. No results for input(s): AMMONIA in the last 168 hours.  CBC: Recent Labs  Lab 06/21/20 0405 06/22/20 0607 06/23/20 0658 06/24/20 0700 06/25/20 0602  WBC 5.0 5.8 6.1 6.3 5.5  NEUTROABS 3.0 3.8 4.1 4.4 3.1  HGB 11.3* 11.8* 12.7 11.6* 11.3*  HCT 37.0 39.8 41.5 39.3 37.3  MCV 74.4* 75.7* 72.9* 75.1* 74.2*  PLT 86* 97* 106* 108* 113*    Cardiac Enzymes: No results for input(s): CKTOTAL, CKMB, CKMBINDEX, TROPONINI in the last 168 hours.  BNP: Invalid input(s): POCBNP  CBG: No results for input(s): GLUCAP in the last 168 hours.  Microbiology: Results for orders placed or performed during the hospital encounter of 07/02/2020  Culture, blood (routine x 2)     Status: Abnormal   Collection Time: 06/03/2020  3:40 AM  Specimen: BLOOD  Result Value Ref Range Status   Specimen Description   Final    BLOOD LEFT AC Performed at [facility], [address] [place], [place] [address]    Special Requests   Final    BOTTLES DRAWN AEROBIC AND ANAEROBIC Blood Culture adequate volume Performed at [facility], [address] [place], [place] [address]    Culture  Setup Time   Final    GRAM POSITIVE COCCI AEROBIC BOTTLE ONLY CRITICAL VALUE NOTED.  VALUE IS CONSISTENT WITH PREVIOUSLY REPORTED AND CALLED VALUE. Performed at [facility], [address] [place], [place] [address]    Culture (A)  Final    STAPHYLOCOCCUS CAPITIS STAPHYLOCOCCUS EPIDERMIDIS    Report Status 06/23/2020 FINAL  Final   Organism ID, Bacteria STAPHYLOCOCCUS EPIDERMIDIS  Final      Susceptibility   Staphylococcus epidermidis - MIC*    CIPROFLOXACIN <=0.5 SENSITIVE Sensitive     ERYTHROMYCIN >=8 RESISTANT Resistant     GENTAMICIN <=0.5 SENSITIVE Sensitive     OXACILLIN <=0.25 SENSITIVE Sensitive     TETRACYCLINE <=1 SENSITIVE Sensitive     VANCOMYCIN 1 SENSITIVE Sensitive      TRIMETH/SULFA <=10 SENSITIVE Sensitive     CLINDAMYCIN <=0.25 SENSITIVE Sensitive     RIFAMPIN <=0.5 SENSITIVE Sensitive     Inducible Clindamycin NEGATIVE Sensitive     * STAPHYLOCOCCUS EPIDERMIDIS  Culture, blood (routine x 2)     Status: Abnormal   Collection Time: 06/16/2020  3:40 AM   Specimen: BLOOD  Result Value Ref Range Status   Specimen Description   Final    BLOOD RIGHT FA Performed at [facility], [address] [place], [place] [address]    Special Requests   Final    BOTTLES DRAWN AEROBIC AND ANAEROBIC Blood Culture adequate volume Performed at [facility], [address] [place], [place] [address]    Culture  Setup Time   Final    GRAM POSITIVE COCCI IN BOTH AEROBIC AND ANAEROBIC BOTTLES CRITICAL RESULT CALLED TO, READ BACK BY AND VERIFIED WITH: SUSAN WATSON AT 4580 06/14/2020.PMF    Culture (A)  Final    STAPHYLOCOCCUS HOMINIS STAPHYLOCOCCUS EPIDERMIDIS THE SIGNIFICANCE OF ISOLATING THIS ORGANISM FROM A SINGLE SET OF BLOOD CULTURES WHEN MULTIPLE SETS ARE DRAWN IS UNCERTAIN. PLEASE NOTIFY THE MICROBIOLOGY DEPARTMENT WITHIN ONE WEEK IF SPECIATION AND SENSITIVITIES ARE REQUIRED. Performed at [facility], [place] [address] [place], [place] [address]    Report Status 06/23/2020 FINAL  Final  Blood Culture ID Panel (Reflexed)     Status: Abnormal   Collection Time: 06/22/2020  3:40 AM  Result Value Ref Range Status   Enterococcus faecalis NOT DETECTED NOT DETECTED Final   Enterococcus Faecium NOT DETECTED NOT DETECTED Final   Listeria monocytogenes NOT DETECTED NOT DETECTED Final   Staphylococcus species DETECTED (A) NOT DETECTED Final    Comment: CRITICAL RESULT CALLED TO, READ BACK BY AND VERIFIED WITH: SUSAN WATSON AT 1833 06/12/2020.PMF    Staphylococcus aureus (BCID) NOT DETECTED NOT DETECTED Final   Staphylococcus epidermidis DETECTED (A) NOT DETECTED Final    Comment: Methicillin (oxacillin) resistant coagulase negative staphylococcus.  Possible blood culture contaminant (unless isolated from more than one blood culture draw or clinical case suggests pathogenicity). No antibiotic treatment is indicated for blood  culture contaminants. CRITICAL RESULT CALLED TO, READ BACK BY AND VERIFIED WITH: SUSAN WATSON AT 8250 06/25/2020.PMF    Staphylococcus lugdunensis NOT DETECTED NOT DETECTED Final  Streptococcus species NOT DETECTED NOT DETECTED Final   Streptococcus agalactiae NOT DETECTED NOT DETECTED Final   Streptococcus pneumoniae NOT DETECTED NOT DETECTED Final   Streptococcus pyogenes NOT DETECTED NOT DETECTED Final   A.calcoaceticus-baumannii NOT DETECTED NOT DETECTED Final   Bacteroides fragilis NOT DETECTED NOT DETECTED Final   Enterobacterales NOT DETECTED NOT DETECTED Final   Enterobacter cloacae complex NOT DETECTED NOT DETECTED Final   Escherichia coli NOT DETECTED NOT DETECTED Final   Klebsiella aerogenes NOT DETECTED NOT DETECTED Final   Klebsiella oxytoca NOT DETECTED NOT DETECTED Final   Klebsiella pneumoniae NOT DETECTED NOT DETECTED Final   Proteus species NOT DETECTED NOT DETECTED Final   Salmonella species NOT DETECTED NOT DETECTED Final   Serratia marcescens NOT DETECTED NOT DETECTED Final   Haemophilus influenzae NOT DETECTED NOT DETECTED Final   Neisseria meningitidis NOT DETECTED NOT DETECTED Final   Pseudomonas aeruginosa NOT DETECTED NOT DETECTED Final   Stenotrophomonas maltophilia NOT DETECTED NOT DETECTED Final   Candida albicans NOT DETECTED NOT DETECTED Final   Candida auris NOT DETECTED NOT DETECTED Final   Candida glabrata NOT DETECTED NOT DETECTED Final   Candida krusei NOT DETECTED NOT DETECTED Final   Candida parapsilosis NOT DETECTED NOT DETECTED Final   Candida tropicalis NOT DETECTED NOT DETECTED Final   Cryptococcus neoformans/gattii NOT DETECTED NOT DETECTED Final   Methicillin resistance mecA/C DETECTED (A) NOT DETECTED Final    Comment: CRITICAL RESULT CALLED TO, READ BACK BY AND  VERIFIED WITH: SUSAN WATSON AT 1833 06/30/2020.PMF Performed at [facility], [address] [place], [place] [address]   SARS Coronavirus 2 by RT PCR (hospital order, performed in [facility] hospital lab) Nasopharyngeal Nasopharyngeal Swab     Status: None   Collection Time: 06/18/2020 12:01 PM   Specimen: Nasopharyngeal Swab  Result Value Ref Range Status   SARS Coronavirus 2 NEGATIVE NEGATIVE Final    Comment: (NOTE) SARS-CoV-2 target nucleic acids are NOT DETECTED.  The SARS-CoV-2 RNA is generally detectable in upper and lower respiratory specimens during the acute phase of infection. The lowest concentration of SARS-CoV-2 viral copies this assay can detect is 250 copies / mL. A negative result does not preclude SARS-CoV-2 infection and should not be used as the sole basis for treatment or other patient management decisions.  A negative result may occur with improper specimen collection / handling, submission of specimen other than nasopharyngeal swab, presence of viral mutation(s) within the areas targeted by this assay, and inadequate number of viral copies (<250 copies / mL). A negative result must be combined with clinical observations, patient history, and epidemiological information.  Fact Sheet for Patients:   StrictlyIdeas.no  Fact Sheet for Healthcare Providers: BankingDealers.co.za  This test is not yet approved or  cleared by the [place] FDA and has been authorized for detection and/or diagnosis of SARS-CoV-2 by FDA under an Emergency Use Authorization (EUA).  This EUA will remain in effect (meaning this test can be used) for the duration of the COVID-19 declaration under Section 564(b)(1) of the Act, 21 U.S.C. section 360bbb-3(b)(1), unless the authorization is terminated or revoked sooner.  Performed at [facility], [address] [place], [place] [address]   CULTURE, BLOOD (ROUTINE X 2) w  Reflex to ID Panel     Status: None   Collection Time: 06/20/20  5:34 PM   Specimen: BLOOD  Result Value Ref Range Status   Specimen Description BLOOD [facility]  Final   Special Requests   Final  BOTTLES DRAWN AEROBIC AND ANAEROBIC Blood Culture adequate volume   Culture   Final    NO GROWTH 5 DAYS Performed at [facility], [address] [place], [place] [address]    Report Status 06/25/2020 FINAL  Final  CULTURE, BLOOD (ROUTINE X 2) w Reflex to ID Panel     Status: None   Collection Time: 06/20/20  5:34 PM   Specimen: BLOOD  Result Value Ref Range Status   Specimen Description BLOOD RAC  Final   Special Requests   Final    BOTTLES DRAWN AEROBIC AND ANAEROBIC Blood Culture adequate volume   Culture   Final    NO GROWTH 5 DAYS Performed at [facility], [address] [place], [place] [address]    Report Status 06/25/2020 FINAL  Final    Coagulation Studies: No results for input(s): LABPROT, INR in the last 72 hours.  Urinalysis: No results for input(s): COLORURINE, LABSPEC, PHURINE, GLUCOSEU, HGBUR, BILIRUBINUR, KETONESUR, PROTEINUR, UROBILINOGEN, NITRITE, LEUKOCYTESUR in the last 72 hours.  Invalid input(s): APPERANCEUR    Imaging: No results found.   Medications:   . sodium chloride     . allopurinol  50 mg Oral Daily  . atorvastatin  20 mg Oral Daily  . ferrous sulfate  325 mg Oral Q breakfast  . gabapentin  300 mg Oral TID  . [START ON 06/27/2020] metoprolol succinate  100 mg Oral Daily  . polyethylene glycol  17 g Oral Daily  . Rivaroxaban  15 mg Oral Q supper  . senna-docusate  1 tablet Oral BID  . sodium chloride flush  3 mL Intravenous Q12H  . torsemide  40 mg Oral Daily   sodium chloride, acetaminophen, ipratropium-albuterol, ondansetron (ZOFRAN) IV, sodium chloride flush  Assessment/ Plan:  Kristy Solomon is a 86 y.o. black female with congestive heart failure, hypertension, atrial fibrillation, hyperlipidemia, history of  lymphoma, who was admitted to [facility] on 06/13/2020 for Hypoxia [R09.02] Elevated TSH [R79.89] Acute CHF (congestive heart failure) (HCC) [I50.9] Acute on chronic congestive heart failure, unspecified heart failure type ([place]) [I50.9]  1. Acute renal failure on chronic kidney disease stage IV with proteinuria : baseline creatinine of 1.75, GFR of 26 on 05/08/20.  Acute renal failure from secondary to cardiorenal syndrome.   2. Hypertension with congestive heart failure Continue Torsemide  Holding metoprolol  3. Anemia with chronic kidney disease Hgb  11.3    LOS: 7 Kristy Solomon 9/24/202111:39 AM

## 2020-06-26 NOTE — Progress Notes (Signed)
Per CCMD - pt had 8 beats of v.tach on tele- pt asymptomatic/  MD made aware- will monitor

## 2020-06-26 NOTE — Progress Notes (Signed)
Progress Note  Patient Name: Kristy Solomon Date of Encounter: 06/26/2020  Primary Cardiologist: Kate Sable, MD   Subjective   No chest pain, racing heart rate, or palpitations noted despite telemetry showing 8 beats of NSVT earlier this morning at approximately 8:41 AM.  She reports her breathing status continues to improve.  She is on as needed home oxygen at 2 L.  She remains on nasal cannula at this time.  She reports improved shortness of breath at rest.  She is ambulated around the room as well, noting improved DOE.  She denies any chest pain with ambulation.  She continues to feel as if she is holding onto fluid, mainly in her thighs.  She reports that her breakfast this morning was not very good, as they overcooked the pancakes and sausage.  She wants the kitchen to send her another breakfast.  Inpatient Medications    Scheduled Meds: . allopurinol  50 mg Oral Daily  . atorvastatin  20 mg Oral Daily  . ferrous sulfate  325 mg Oral Q breakfast  . gabapentin  300 mg Oral TID  . metoprolol succinate  50 mg Oral BID  . polyethylene glycol  17 g Oral Daily  . Rivaroxaban  15 mg Oral Q supper  . senna-docusate  1 tablet Oral BID  . sodium chloride flush  3 mL Intravenous Q12H  . torsemide  40 mg Oral Daily   Continuous Infusions: . sodium chloride     PRN Meds: sodium chloride, acetaminophen, ipratropium-albuterol, ondansetron (ZOFRAN) IV, sodium chloride flush   Vital Signs    Vitals:   06/25/20 1951 06/25/20 2228 06/26/20 0520 06/26/20 0751  BP: 128/81  (!) 103/58 (!) 117/94  Pulse: (!) 58 84 89 92  Resp: 17  16 18   Temp: 99 F (37.2 C)  97.8 F (36.6 C) 97.7 F (36.5 C)  TempSrc: Oral  Oral Oral  SpO2: 97%  100% 100%  Weight:   99.7 kg   Height:        Intake/Output Summary (Last 24 hours) at 06/26/2020 0910 Last data filed at 06/25/2020 1840 Gross per 24 hour  Intake 480 ml  Output 300 ml  Net 180 ml   Last 3 Weights 06/26/2020 06/25/2020  06/24/2020  Weight (lbs) 219 lb 11.2 oz 217 lb 4.8 oz 219 lb 3.2 oz  Weight (kg) 99.655 kg 98.567 kg 99.428 kg      Telemetry    Afib, PVCs, 8 beat run of NSVT at approximately 8:41 AM- Personally Reviewed  ECG    No new tracings- Personally Reviewed  Physical Exam   GEN: No acute distress.  Seated in recliner next to the bed and eating breakfast. Neck: JVP remains at least mild to moderately elevated though difficult to assess due to body habitus and patient position. Cardiac: IRIR, no murmurs, rubs, or gallops.  Respiratory:  Bibasilar crackles but appears comfortable on nasal cannula oxygen with normal work of breathing GI: Soft, nontender, non-distended  MS:  2+ bilateral lower extremity edema, extending up to the thigh and abdomen; No deformity. Neuro:  Nonfocal  Psych: Normal affect   Labs    High Sensitivity Troponin:   Recent Labs  Lab 06/27/2020 0117 06/25/2020 0340  TROPONINIHS 86* 83*      Chemistry Recent Labs  Lab 06/24/20 0700 06/25/20 0602 06/26/20 0554  NA 142 140 142  K 3.9 3.9 3.6  CL 107 105 106  CO2 27 27 27   GLUCOSE 97 103* 99  BUN 51* 53* 52*  CREATININE 2.25* 2.11* 2.26*  CALCIUM 8.1* 8.4* 8.5*  ALBUMIN 2.4* 3.1* 3.0*  GFRNONAA 19* 20* 19*  GFRAA 22* 23* 22*  ANIONGAP 8 8 9      Hematology Recent Labs  Lab 06/23/20 0658 06/24/20 0700 06/25/20 0602  WBC 6.1 6.3 5.5  RBC 5.69* 5.23* 5.03  HGB 12.7 11.6* 11.3*  HCT 41.5 39.3 37.3  MCV 72.9* 75.1* 74.2*  MCH 22.3* 22.2* 22.5*  MCHC 30.6 29.5* 30.3  RDW 19.9* 19.6* 19.6*  PLT 106* 108* 113*    BNP Recent Labs  Lab 06/20/20 0636  BNP 672.1*     DDimer No results for input(s): DDIMER in the last 168 hours.   Radiology    No results found.  Cardiac Studies   Echo 12/31/2019 1. Left ventricular ejection fraction, by estimation, is 50 to 55%. The  left ventricle has low normal function. The left ventricle has no regional  wall motion abnormalities. There is mild left  ventricular hypertrophy.  Left ventricular diastolic  parameters are indeterminate.  2. Right ventricular systolic function is normal. The right ventricular  size is normal. Tricuspid regurgitation signal is inadequate for assessing  PA pressure.  3. Left atrial size was mildly dilated.  4. Right atrial size was mildly dilated.  5. The mitral valve is abnormal. Mild mitral valve regurgitation. No  evidence of mitral stenosis.  6. Tricuspid valve regurgitation is mild to moderate.  7. The aortic valve is tricuspid. Aortic valve regurgitation is not  visualized. No aortic stenosis is present.  8. The inferior vena cava is dilated in size with <50% respiratory  variability, suggesting right atrial pressure of 15 mmHg.   [place]  07/2019 Patient had a min HR of 36 bpm, max HR of 171 bpm, and avg HR of 67 bpm. Predominant underlying rhythm was Sinus Rhythm. First Degree AV Block was present. occasional Supraventricular Tachycardia runs occurred, the run with the fastest interval lasting 4 beats with a max rate of 171 bpm, Isolated SVEs were frequent (5.6%, [id number]), SVE Couplets were rare (<1.0%, 4699),  and SVE Triplets were rare (<1.0%, 508). Isolated VEs were frequent (20.3%, [id number]), VE Couplets were rare (<1.0%, 2113), and VE Triplets were rare (<1.0%, 986).    Patient Profile     86 y.o. female with history of permanent atrial fibrillation on Eliquis (CHA2DS2VASc score of at least  6), HFpEF, stage III-IV CKD, hypertension, hyperlipidemia, prior COVID-19 infection (01/2020), coronary and aortic atherosclerosis by CT, lymphoma, and who is being seen today for acute on chronic heart failure in the setting of medication noncompliance.  Assessment & Plan    Acute on Chronic diastolic CHF --Reports improving breathing status, though still feels she is holding onto fluid.  Volume status likely exacerbated by RVR in the setting of medication noncompliance.  12/31/19 echo showed diastolic  dysfunction / IVC dilated in size and elevated RA pressure.  Over the last 3 to 5 months, she has noted progressive HF s/sx in the setting of being out of her cardiac medications and O2 tank.   --Remains volume up on exam today.IV diuresis this admission has been complicated by AKI, resulting in transition to oral torsemide 9/22.  --Continue current oral torsemide 40mg  daily.   --Daily BMET.  Cr 1.95  2.11  2.26 with BUN 340  53  52.  Baseline Cr ~1.7-1.8. --Continue I/Os, daily wts. Net +180 cc over the last 24 hours.  Weight 99.7kg or 219lbs. Office dry weight  198 pounds per primary cardiologist.   Permanent Atrial Fibrillation with RVR --Afib with RVR, initially diagnosed 10/2019. Rates controlled with BB, though breakthrough NSVT this AM. Continue OAC with Xarelto 15mg  daily due to CrCl below 50, though this may not be the best option given her renal function. CHA2DS2VASc score of at least 6 (CHF, HTN, age x2, vascular, female). Reports poor tolerance of Eliquis 2/2 stomach discomfort. Replete electrolytes with K goal 4.0 and Mg goal 2.0.  HTN Hypotension --Developed hypotension 9/22. Midodrine started. Diastolic HTN then noted. Midodrine discontinued. Continue current BB. Continue to monitor pressures.   Hypoalbuminemia --Likely contributing to overall edema.  IV albumin 9/22.  Continue to monitor.   HLD --Continue statin. LDL 42.   AOCKDIII-IV --AKI with earlier IV diuresis. Nephrology following. Continue oral diuresis. Replete electrolytes with K goal 4.0 and Mg goal 2.0.  Elevated HS Tn --No CP.  Elevated HS Tn likely in the setting of the above atrial fibrillation and volume overload/ acute on chronic diastolic heart failure.  Suspect supply demand ischemia; however, she has never undergone ischemic evaluation.  EKG without acute ST/T changes.  Could consider ischemic evaluation as an outpatient; however, with advanced CKD 3/4, she is poor candidate for LHC.  Continue current BB and  statin.  Xarelto in lieu of ASA.   For questions or updates, please contact [place] Please consult [email or web address] for contact info under        Signed, Arvil Chaco, PA-C  06/26/2020, 9:10 AM

## 2020-06-26 NOTE — Progress Notes (Signed)
Physical Therapy Treatment Patient Details Name: Kristy Solomon MRN: [id number] DOB: 1929-07-22 Today's Date: 06/26/2020    History of Present Illness Kristy Solomon is an 86 y/o female who was admitted for worsening SOB with an increase in O2 to 3L due to symptoms. In ED, pt was tachycardic and in rapid A-fib w/ HR to 170. PMH includes chronic respiratory failure on 2L O2 continuous, chrinic diastolic dysfunction, A-fib on chronic anticoagulation therapy, HTN with complications of CKD III-IV, covid-19 virus infection April 2021, and history of malignant lymphoma.    PT Comments    Pt received sitting in chair after OT session and pt agreeable to attempt ambulation into hallway during PT session. Pt ambulated a total of 250 feet using RW with CGA for steadying, safety, and assistance to hold up brief during mobility. Chair follow provided to allow seated rest break which was required midway through distance secondary to BLE fatigue. Pt with slightly decreased but steady gait speed throughout distance. Pt's HR noted to jump around between 55 and 70 during ambulation then, once seated, incrementally increased to 98 bpm. Pt asymptomatic and with no s/s of distress. Pt returned to room and performed therex seated in recliner chair for promotion of BLE strengthening. Pt with notable improvement in BLE active movements and required min A for heel slides only. Pt making great progress during PT session today. Pt remained on 2L O2 via nasal cannula throughout session.  Vitals: Pre: 99%, 76 HR During: 98%, 55-98 HR Post: 100%, 58 HR    Follow Up Recommendations  Home health PT     Equipment Recommendations  None recommended by PT    Recommendations for Other Services       Precautions / Restrictions Precautions Precautions: Fall Restrictions Weight Bearing Restrictions: No    Mobility  Bed Mobility               General bed mobility comments: not performed - pt sitting up in  chair upon arrival  Transfers Overall transfer level: Needs assistance Equipment used: Rolling walker (2 wheeled) Transfers: Sit to/from Stand Sit to Stand: Min guard;Supervision         General transfer comment: CGA for initial sit <> stand; pt progressed to SBA for safety and requires verbal cues for hand placement  Ambulation/Gait Ambulation/Gait assistance: Min guard;+2 safety/equipment Gait Distance (Feet): 250 Feet Assistive device: Rolling walker (2 wheeled) Gait Pattern/deviations: Step-through pattern;Wide base of support Gait velocity: decreased   General Gait Details: Utilizing RW, pt ambulated 250 total with CGA for safety and to hold up brief. Chair follow provided as this was pt's first time ambulating outside of room. Pt required seated rest break mid-distance secondary to BLE fatigue.    Stairs             Wheelchair Mobility    Modified Rankin (Stroke Patients Only)       Balance Overall balance assessment: Needs assistance Sitting-balance support: Feet supported Sitting balance-Leahy Scale: Good Sitting balance - Comments: no overt LOB noted   Standing balance support: Bilateral upper extremity supported Standing balance-Leahy Scale: Good Standing balance comment: BUE on RW with CGA during dynamic activity                            Cognition Arousal/Alertness: Awake/alert Behavior During Therapy: WFL for tasks assessed/performed Overall Cognitive Status: Within Functional Limits for tasks assessed  Exercises Other Exercises Other Exercises: BLE theres in sitting: hip ab/add and resisted leg press x 10, heel slides with min A x 10    General Comments        Pertinent Vitals/Pain Pain Assessment: No/denies pain    Home Living                      Prior Function            PT Goals (current goals can now be found in the care plan section) Acute Rehab PT  Goals Patient Stated Goal: none stated PT Goal Formulation: With patient Time For Goal Achievement: 07/13/2020 Potential to Achieve Goals: Good Progress towards PT goals: Progressing toward goals    Frequency    Min 2X/week      PT Plan Current plan remains appropriate    Co-evaluation              AM-PAC PT "6 Clicks" Mobility   Outcome Measure  Help needed turning from your back to your side while in a flat bed without using bedrails?: A Lot Help needed moving from lying on your back to sitting on the side of a flat bed without using bedrails?: A Little Help needed moving to and from a bed to a chair (including a wheelchair)?: A Little Help needed standing up from a chair using your arms (e.g., wheelchair or bedside chair)?: A Little Help needed to walk in hospital room?: A Little Help needed climbing 3-5 steps with a railing? : A Lot 6 Click Score: 16    End of Session Equipment Utilized During Treatment: Gait belt;Oxygen;Other (comment) (2L) Activity Tolerance: Patient tolerated treatment well Patient left: in chair;with call bell/phone within reach;with chair alarm set Nurse Communication: Mobility status PT Visit Diagnosis: Unsteadiness on feet (R26.81);Other abnormalities of gait and mobility (R26.89);Muscle weakness (generalized) (M62.81)     Time: 1010-1037 PT Time Calculation (min) (ACUTE ONLY): 27 min  Charges:                        Vale Haven, SPT   Vale Haven 06/26/2020, 12:17 PM

## 2020-06-27 LAB — CBC
HCT: 37.1 % (ref 36.0–46.0)
Hemoglobin: 11.1 g/dL — ABNORMAL LOW (ref 12.0–15.0)
MCH: 22.4 pg — ABNORMAL LOW (ref 26.0–34.0)
MCHC: 29.9 g/dL — ABNORMAL LOW (ref 30.0–36.0)
MCV: 74.8 fL — ABNORMAL LOW (ref 80.0–100.0)
Platelets: 134 10*3/uL — ABNORMAL LOW (ref 150–400)
RBC: 4.96 MIL/uL (ref 3.87–5.11)
RDW: 19.9 % — ABNORMAL HIGH (ref 11.5–15.5)
WBC: 5 10*3/uL (ref 4.0–10.5)
nRBC: 0 % (ref 0.0–0.2)

## 2020-06-27 LAB — RENAL FUNCTION PANEL
Albumin: 2.8 g/dL — ABNORMAL LOW (ref 3.5–5.0)
Anion gap: 9 (ref 5–15)
BUN: 58 mg/dL — ABNORMAL HIGH (ref 8–23)
CO2: 27 mmol/L (ref 22–32)
Calcium: 8.4 mg/dL — ABNORMAL LOW (ref 8.9–10.3)
Chloride: 105 mmol/L (ref 98–111)
Creatinine, Ser: 2.33 mg/dL — ABNORMAL HIGH (ref 0.44–1.00)
GFR calc Af Amer: 21 mL/min — ABNORMAL LOW (ref >60)
GFR calc non Af Amer: 18 mL/min — ABNORMAL LOW (ref >60)
Glucose, Bld: 106 mg/dL — ABNORMAL HIGH (ref 70–99)
Phosphorus: 3.8 mg/dL (ref 2.5–4.6)
Potassium: 3.7 mmol/L (ref 3.5–5.1)
Sodium: 141 mmol/L (ref 135–145)

## 2020-06-27 MED ORDER — METOPROLOL SUCCINATE ER 50 MG PO TB24
75.0000 mg | ORAL_TABLET | Freq: Every day | ORAL | Status: DC
  Administered 2020-06-27 – 2020-06-30 (×4): 75 mg via ORAL
  Filled 2020-06-27 (×4): qty 1

## 2020-06-27 MED ORDER — FUROSEMIDE 10 MG/ML IJ SOLN
80.0000 mg | Freq: Two times a day (BID) | INTRAMUSCULAR | Status: DC
  Administered 2020-06-27 – 2020-06-29 (×3): 80 mg via INTRAVENOUS
  Filled 2020-06-27 (×3): qty 8

## 2020-06-27 NOTE — Progress Notes (Signed)
Mobility Specialist - Progress Note   06/27/20 1306  Mobility  Activity Transferred:  Bed to chair  Level of Assistance Contact guard assist, steadying assist  Assistive Device Front wheel walker  Distance Ambulated (ft) 3 ft  Mobility Response Tolerated well  Mobility performed by Mobility specialist  $Mobility charge 1 Mobility    Pre-mobility: 73 HR, 112/63 BP, 100% SpO2 Post-mobility: 92 HR, 114/77 BP, 100% SpO2   Pt sleeping in bed upon arrival. Pt easily awakens. Pt agreed to session. Pt able to transition from supine to sit EOB w/ SBA. Pt S2S w/ CGA. While standing at bedside, pt had an incontinent episode, limiting further mobility d/t soiling gown and socks. Pt able to transfer, after floor was cleaned from incontinent episode, to chair w/ CGA. No LOB noted, no c/o SOB. Mobility specialist assisted pt w/ hygiene while pt sitting on chair. NT notified. Overall, pt tolerated session well. Pt left sitting on recliner w/ all needs placed in reach. Chair alarm set. Nurse was notified.    Valita Righter Mobility Specialist  06/27/20, 1:12 PM

## 2020-06-27 NOTE — Progress Notes (Signed)
Kristy Solomon  MRN: [id number]  DOB/AGE: 12-04-1928 86 y.o.  Primary Care Physician:Tejan-Sie, Brandt Loosen, MD  Admit date: 06/13/2020  Chief Complaint:  Chief Complaint  Patient presents with  . Shortness of Breath    S-Pt presented on  06/15/2020 with  Chief Complaint  Patient presents with  . Shortness of Breath  . Patient had multiple comments in today's visit.  Patient initial complaint was" Can you do to something that my swelling goes down? Patient Comment was " I did not remember to order my medications, I think that backfired on me". I then discussed with the patient will need to give her IV diuretics Patient voiced understanding  Medications  . allopurinol  50 mg Oral Daily  . atorvastatin  20 mg Oral Daily  . ferrous sulfate  325 mg Oral Q breakfast  . gabapentin  300 mg Oral TID  . metoprolol succinate  75 mg Oral Daily  . polyethylene glycol  17 g Oral Daily  . Rivaroxaban  15 mg Oral Q supper  . senna-docusate  1 tablet Oral BID  . sodium chloride flush  3 mL Intravenous Q12H  . torsemide  40 mg Oral Daily         BRA:XENMM from the symptoms mentioned above,there are no other symptoms referable to all systems reviewed.  Physical Exam: Vital signs in last 24 hours: Temp:  [98.1 F (36.7 C)-98.3 F (36.8 C)] 98.3 F (36.8 C) (09/25 1156) Pulse Rate:  [83-106] 106 (09/25 1156) Resp:  [16-19] 19 (09/25 1156) BP: (99-114)/(50-82) 99/50 (09/25 1156) SpO2:  [96 %-100 %] 99 % (09/25 1156) Weight:  [101.2 kg] 101.2 kg (09/25 0317) Weight change: 1.497 kg Last BM Date: 06/25/20  Intake/Output from previous day: 09/24 0701 - 09/25 0700 In: -  Out: 750 [Urine:750] Total I/O In: 240 [P.O.:240] Out: -    Physical Exam: General- pt is awake,alert, oriented to time place and person Resp- No acute REsp distress, CTA B/L NO Rhonchi CVS- S1S2 regular in rate and rhythm GIT- BS+, soft, NT, ND EXT- 2+ LE Edema, Cyanosis   Lab Results: CBC Recent  Labs    06/25/20 0602 06/27/20 0554  WBC 5.5 5.0  HGB 11.3* 11.1*  HCT 37.3 37.1  PLT 113* 134*    BMET Recent Labs    06/26/20 0554 06/27/20 0554  NA 142 141  K 3.6 3.7  CL 106 105  CO2 27 27  GLUCOSE 99 106*  BUN 52* 58*  CREATININE 2.26* 2.33*  CALCIUM 8.5* 8.4*    Creatinine trend 2021 2.0--2.3 1.6--1.7-baseline 1.5--2.5-late March/April admission  2020 1.6--2.3 2019 1.4--1.7 2018 1.2--1.3 2017 1.2--1.8 2015 1.6--1.8 2014 1.2--1.5 2013 1.2--1.8   MICRO Recent Results (from the past 240 hour(s))  Culture, blood (routine x 2)     Status: Abnormal   Collection Time: 06/13/2020  3:40 AM   Specimen: BLOOD  Result Value Ref Range Status   Specimen Description   Final    BLOOD LEFT [facility] Performed at [facility], [address], [place], [place] [address]    Special Requests   Final    BOTTLES DRAWN AEROBIC AND ANAEROBIC Blood Culture adequate volume Performed at [facility], [address] [place], [place] [address]    Culture  Setup Time   Final    GRAM POSITIVE COCCI AEROBIC BOTTLE ONLY CRITICAL VALUE NOTED.  VALUE IS CONSISTENT WITH PREVIOUSLY REPORTED AND CALLED VALUE. Performed at [facility], [address] [place],  [place] [address]    Culture (A)  Final    STAPHYLOCOCCUS CAPITIS STAPHYLOCOCCUS EPIDERMIDIS    Report Status 06/23/2020 FINAL  Final   Organism ID, Bacteria STAPHYLOCOCCUS EPIDERMIDIS  Final      Susceptibility   Staphylococcus epidermidis - MIC*    CIPROFLOXACIN <=0.5 SENSITIVE Sensitive     ERYTHROMYCIN >=8 RESISTANT Resistant     GENTAMICIN <=0.5 SENSITIVE Sensitive     OXACILLIN <=0.25 SENSITIVE Sensitive     TETRACYCLINE <=1 SENSITIVE Sensitive     VANCOMYCIN 1 SENSITIVE Sensitive     TRIMETH/SULFA <=10 SENSITIVE Sensitive     CLINDAMYCIN <=0.25 SENSITIVE Sensitive     RIFAMPIN <=0.5 SENSITIVE Sensitive     Inducible Clindamycin NEGATIVE Sensitive     * STAPHYLOCOCCUS EPIDERMIDIS   Culture, blood (routine x 2)     Status: Abnormal   Collection Time: 06/25/2020  3:40 AM   Specimen: BLOOD  Result Value Ref Range Status   Specimen Description   Final    BLOOD RIGHT FA Performed at [facility], [address] [place], [place] [address]    Special Requests   Final    BOTTLES DRAWN AEROBIC AND ANAEROBIC Blood Culture adequate volume Performed at [facility], [address] [place], [place] [address]    Culture  Setup Time   Final    GRAM POSITIVE COCCI IN BOTH AEROBIC AND ANAEROBIC BOTTLES CRITICAL RESULT CALLED TO, READ BACK BY AND VERIFIED WITH: SUSAN WATSON AT 2641 06/18/2020.PMF    Culture (A)  Final    STAPHYLOCOCCUS HOMINIS STAPHYLOCOCCUS EPIDERMIDIS THE SIGNIFICANCE OF ISOLATING THIS ORGANISM FROM A SINGLE SET OF BLOOD CULTURES WHEN MULTIPLE SETS ARE DRAWN IS UNCERTAIN. PLEASE NOTIFY THE MICROBIOLOGY DEPARTMENT WITHIN ONE WEEK IF SPECIATION AND SENSITIVITIES ARE REQUIRED. Performed at [facility], [place] [address] [place], [place] [address]    Report Status 06/23/2020 FINAL  Final  Blood Culture ID Panel (Reflexed)     Status: Abnormal   Collection Time: 06/17/2020  3:40 AM  Result Value Ref Range Status   Enterococcus faecalis NOT DETECTED NOT DETECTED Final   Enterococcus Faecium NOT DETECTED NOT DETECTED Final   Listeria monocytogenes NOT DETECTED NOT DETECTED Final   Staphylococcus species DETECTED (A) NOT DETECTED Final    Comment: CRITICAL RESULT CALLED TO, READ BACK BY AND VERIFIED WITH: SUSAN WATSON AT 1833 06/12/2020.PMF    Staphylococcus aureus (BCID) NOT DETECTED NOT DETECTED Final   Staphylococcus epidermidis DETECTED (A) NOT DETECTED Final    Comment: Methicillin (oxacillin) resistant coagulase negative staphylococcus. Possible blood culture contaminant (unless isolated from more than one blood culture draw or clinical case suggests pathogenicity). No antibiotic treatment is indicated for blood  culture  contaminants. CRITICAL RESULT CALLED TO, READ BACK BY AND VERIFIED WITH: SUSAN WATSON AT 4076 06/04/2020.PMF    Staphylococcus lugdunensis NOT DETECTED NOT DETECTED Final   Streptococcus species NOT DETECTED NOT DETECTED Final   Streptococcus agalactiae NOT DETECTED NOT DETECTED Final   Streptococcus pneumoniae NOT DETECTED NOT DETECTED Final   Streptococcus pyogenes NOT DETECTED NOT DETECTED Final   A.calcoaceticus-baumannii NOT DETECTED NOT DETECTED Final   Bacteroides fragilis NOT DETECTED NOT DETECTED Final   Enterobacterales NOT DETECTED NOT DETECTED Final   Enterobacter cloacae complex NOT DETECTED NOT DETECTED Final   Escherichia coli NOT DETECTED NOT DETECTED Final   Klebsiella aerogenes NOT DETECTED NOT DETECTED Final   Klebsiella oxytoca NOT DETECTED NOT DETECTED Final   Klebsiella pneumoniae NOT DETECTED NOT DETECTED Final  Proteus species NOT DETECTED NOT DETECTED Final   Salmonella species NOT DETECTED NOT DETECTED Final   Serratia marcescens NOT DETECTED NOT DETECTED Final   Haemophilus influenzae NOT DETECTED NOT DETECTED Final   Neisseria meningitidis NOT DETECTED NOT DETECTED Final   Pseudomonas aeruginosa NOT DETECTED NOT DETECTED Final   Stenotrophomonas maltophilia NOT DETECTED NOT DETECTED Final   Candida albicans NOT DETECTED NOT DETECTED Final   Candida auris NOT DETECTED NOT DETECTED Final   Candida glabrata NOT DETECTED NOT DETECTED Final   Candida krusei NOT DETECTED NOT DETECTED Final   Candida parapsilosis NOT DETECTED NOT DETECTED Final   Candida tropicalis NOT DETECTED NOT DETECTED Final   Cryptococcus neoformans/gattii NOT DETECTED NOT DETECTED Final   Methicillin resistance mecA/C DETECTED (A) NOT DETECTED Final    Comment: CRITICAL RESULT CALLED TO, READ BACK BY AND VERIFIED WITH: SUSAN WATSON AT 1833 06/25/2020.PMF Performed at [facility], [address] [place], [place] [address]   SARS Coronavirus 2 by RT PCR (hospital order, performed  in [facility] hospital lab) Nasopharyngeal Nasopharyngeal Swab     Status: None   Collection Time: 06/22/2020 12:01 PM   Specimen: Nasopharyngeal Swab  Result Value Ref Range Status   SARS Coronavirus 2 NEGATIVE NEGATIVE Final    Comment: (NOTE) SARS-CoV-2 target nucleic acids are NOT DETECTED.  The SARS-CoV-2 RNA is generally detectable in upper and lower respiratory specimens during the acute phase of infection. The lowest concentration of SARS-CoV-2 viral copies this assay can detect is 250 copies / mL. A negative result does not preclude SARS-CoV-2 infection and should not be used as the sole basis for treatment or other patient management decisions.  A negative result may occur with improper specimen collection / handling, submission of specimen other than nasopharyngeal swab, presence of viral mutation(s) within the areas targeted by this assay, and inadequate number of viral copies (<250 copies / mL). A negative result must be combined with clinical observations, patient history, and epidemiological information.  Fact Sheet for Patients:   StrictlyIdeas.no  Fact Sheet for Healthcare Providers: BankingDealers.co.za  This test is not yet approved or  cleared by the [place] FDA and has been authorized for detection and/or diagnosis of SARS-CoV-2 by FDA under an Emergency Use Authorization (EUA).  This EUA will remain in effect (meaning this test can be used) for the duration of the COVID-19 declaration under Section 564(b)(1) of the Act, 21 U.S.C. section 360bbb-3(b)(1), unless the authorization is terminated or revoked sooner.  Performed at [facility], [address] [place], [place] [address]   CULTURE, BLOOD (ROUTINE X 2) w Reflex to ID Panel     Status: None   Collection Time: 06/20/20  5:34 PM   Specimen: BLOOD  Result Value Ref Range Status   Specimen Description BLOOD [facility]  Final   Special Requests    Final    BOTTLES DRAWN AEROBIC AND ANAEROBIC Blood Culture adequate volume   Culture   Final    NO GROWTH 5 DAYS Performed at [facility], [address] [place], [place] [address]    Report Status 06/25/2020 FINAL  Final  CULTURE, BLOOD (ROUTINE X 2) w Reflex to ID Panel     Status: None   Collection Time: 06/20/20  5:34 PM   Specimen: BLOOD  Result Value Ref Range Status   Specimen Description BLOOD RAC  Final   Special Requests   Final    BOTTLES DRAWN AEROBIC AND ANAEROBIC Blood Culture adequate  volume   Culture   Final    NO GROWTH 5 DAYS Performed at [facility], [address] [place], [place] [address]    Report Status 06/25/2020 FINAL  Final      Lab Results  Component Value Date   CALCIUM 8.4 (L) 06/27/2020   PHOS 3.8 06/27/2020               Impression:   Kristy Solomon is a 86 y.o. black female with congestive heart failure, hypertension, atrial fibrillation, hyperlipidemia, history of lymphoma, who was admitted to [facility] on9/17/2021for  Hypoxia [R09.02] Elevated TSH [R79.89] Acute CHF (congestive heart failure) ([place]) [I50.9] Acute on chronic congestive heart failure, unspecified heart failure type ([place]) [I50.9]  1)Renal  AKI secondary to cardiorenal syndrome Patient has AKI on CKD Patient has CKD stage IV Patient has CKD since 2013 Patient has CKD most likely secondary to hypertension There is possible contribution from age associated decline as patient is 86 years old There is also some contribution from multiple episodes of AKI      2)Hypotension Blood pressure is stable Patient will benefit from midodrine   3)Anemia of chronic disease  HGb at goal (9--11) No need for Epogen  4) acute on chronic diastolic CHF Patient was earlier on IV Lasix Patient was then transitioned to p.o. torsemide As per the data available patient is 5 L negative since admission on September 17 Patient peak weight was 103.2 kg  which came down to 98.6 kg and is now 101.2 kg  Will DC p.o. torsemide and start patient on IV Lasix   5) thrombocytopenia Platelet counts are stable  6) electrolytes   sodium Normonatremic   potassium Normokalemic    7)Acid base Co2 at goal     Plan:   Will DC p.o. torsemide and start patient on IV Lasix We will continue to follow    Niklaus Mamaril s [facility] 06/27/2020, 12:31 PM

## 2020-06-27 NOTE — Progress Notes (Addendum)
Progress Note  Patient Name: Kristy Solomon Date of Encounter: 06/27/2020  Primary Cardiologist: Kate Sable, MD  Subjective   No c/p or sob.  Feels much better.  Wants to go home - promises to take meds and alert [place] when she's about to run out.  Inpatient Medications    Scheduled Meds: . allopurinol  50 mg Oral Daily  . atorvastatin  20 mg Oral Daily  . ferrous sulfate  325 mg Oral Q breakfast  . gabapentin  300 mg Oral TID  . metoprolol succinate  100 mg Oral Daily  . polyethylene glycol  17 g Oral Daily  . Rivaroxaban  15 mg Oral Q supper  . senna-docusate  1 tablet Oral BID  . sodium chloride flush  3 mL Intravenous Q12H  . torsemide  40 mg Oral Daily   Continuous Infusions: . sodium chloride     PRN Meds: sodium chloride, acetaminophen, ipratropium-albuterol, ondansetron (ZOFRAN) IV, sodium chloride flush   Vital Signs    Vitals:   06/26/20 1952 06/27/20 0317 06/27/20 0319 06/27/20 0815  BP: 106/82 114/67  100/69  Pulse: 83 90 90 96  Resp: 16 18  17   Temp: 98.1 F (36.7 C) 98.2 F (36.8 C)  98.2 F (36.8 C)  TempSrc: Oral Oral  Oral  SpO2: 100% 98% 96% 98%  Weight:  101.2 kg    Height:        Intake/Output Summary (Last 24 hours) at 06/27/2020 0831 Last data filed at 06/27/2020 0444 Gross per 24 hour  Intake --  Output 750 ml  Net -750 ml   Filed Weights   06/25/20 0354 06/26/20 0520 06/27/20 0317  Weight: 98.6 kg 99.7 kg 101.2 kg    Physical Exam   GEN: obese, in no acute distress.  HEENT: Grossly normal.  Neck: Supple, mod elev jvp, no carotid bruits, or masses. Cardiac: IR, IR, distant, no murmurs, rubs, or gallops. No clubbing, cyanosis. Cont to have 1-2+ bilat LEE w/ post thigh, hip, and abd flank edema.  Radials/DP/PT 1+ and equal bilaterally.  Respiratory:  Respirations regular and unlabored, clear to auscultation bilaterally. GI: Soft, nontender, nondistended, BS + x 4. Ongoing mild flank edema. MS: no deformity or  atrophy. Skin: warm and dry, no rash. Neuro:  Strength and sensation are intact. Psych: AAOx3.  Normal affect.  Labs    Chemistry Recent Labs  Lab 06/25/20 0602 06/26/20 0554 06/27/20 0554  NA 140 142 141  K 3.9 3.6 3.7  CL 105 106 105  CO2 27 27 27   GLUCOSE 103* 99 106*  BUN 53* 52* 58*  CREATININE 2.11* 2.26* 2.33*  CALCIUM 8.4* 8.5* 8.4*  ALBUMIN 3.1* 3.0* 2.8*  GFRNONAA 20* 19* 18*  GFRAA 23* 22* 21*  ANIONGAP 8 9 9      Hematology Recent Labs  Lab 06/24/20 0700 06/25/20 0602 06/27/20 0554  WBC 6.3 5.5 5.0  RBC 5.23* 5.03 4.96  HGB 11.6* 11.3* 11.1*  HCT 39.3 37.3 37.1  MCV 75.1* 74.2* 74.8*  MCH 22.2* 22.5* 22.4*  MCHC 29.5* 30.3 29.9*  RDW 19.6* 19.6* 19.9*  PLT 108* 113* 134*    Cardiac Enzymes  Recent Labs  Lab 06/05/2020 0117 06/16/2020 0340  TROPONINIHS 86* 83*       Lipids  Lab Results  Component Value Date   CHOL 99 01/01/2020   HDL 47 01/01/2020   LDLCALC 42 01/01/2020   TRIG 52 01/01/2020   CHOLHDL 2.1 01/01/2020    HbA1c  Lab Results  Component Value Date   HGBA1C 5.8 (H) 01/01/2020    Radiology    No results found.  Telemetry    AFib 80's to 90's - Personally Reviewed  Cardiac Studies   Echo 12/2019 1. Left ventricular ejection fraction, by estimation, is 50 to 55%. The  left ventricle has low normal function. The left ventricle has no regional  wall motion abnormalities. There is mild left ventricular hypertrophy.  Left ventricular diastolic  parameters are indeterminate.  2. Right ventricular systolic function is normal. The right ventricular  size is normal. Tricuspid regurgitation signal is inadequate for assessing  PA pressure.  3. Left atrial size was mildly dilated.  4. Right atrial size was mildly dilated.  5. The mitral valve is abnormal. Mild mitral valve regurgitation. No  evidence of mitral stenosis.  6. Tricuspid valve regurgitation is mild to moderate.  7. The aortic valve is tricuspid. Aortic  valve regurgitation is not  visualized. No aortic stenosis is present.  8. The inferior vena cava is dilated in size with <50% respiratory  variability, suggesting right atrial pressure of 15 mmHg.  Patient Profile     86 y.o.femalewith history of permanent atrial fibrillation, HFpEF, CKD stage IV, hypertension, lymphoma, and anemia of chronic disease who presented due to shortness of breath and volume overload in the setting of medication noncompliance.  Assessment & Plan    1. Acute on chonic HF w/ preserved EF: EF 50-55% by echo 3.2021.  Presented 9/17 w/ massive volume overload in the setting of noncompliance w/ home meds including torsemide and metoprolol.  Transitioned to oral torsemide 9/22 after bumping creat on IV lasix.  She cont to have 1-2+ bilat LEE extending to posterior thighs and abd flanks, though overall, much improved since admission.  I/O recorded as minus 750 overnight but no intake recorded yesterday.  ? Minus 4L since admission.  If wts accurate, she is up 2.6kg over past two days.  Currently listed @ 101.2 kg. Dry wt likely closer to 903kg.  BUN/Creat rising over past 2 days  58/2.33 this AM. Bicarb stable @ 27. Baseline creat 1.75 on 8/6. Difficult situation as renal fxn worsening on torsemide 40 daily w/ reported prior home dose of 40 bid.  Defer to nephrology. Edema does seem out of proportion to Ss as she feels much better. Albumin 2.8 this AM, down since infusion 9/22.  2. Permanent Afib:  Rate controlled on  blocker, though will have to reduce dose slightly to allow for more BP.  Will have to watch rates closely as tachycardia will worsen diast dysfxn and CHF.  Not a candidate for digoxin for rate-control due to renal failure.  Cont reduced dose xarelto, though as prev noted, may not be best choice if cr cl cont to drop in setting of cardiorenal syndrome.  She is a poor coumadin candidate however, if she discharges to home due to h/o noncompliance and difficulty w/  f/u.  3.  Acute on chronic Stage IV kidney dzs/cardiorenal syndrome:  See #1.  Renal fxn worsening on torsemide 40 daily.  Nephrology following.  4.  Hypotension: significant hypotension 9/22.  Midodrine was started but then d/c'd w/in 24 hrs as BPs improved.  Pressures soft yesterday/overnight w/ dip to 88/53 yesterday afternoon.  100/69 this AM.  She is on  blocker for rate control of afib.  I will reduce toprol dose to 75mg  daily as relative hypotension may be playing role in worsening renal fxn. Will have to  watch HR as tachycardia is sure to make diastolic dysfxn worse.  5.  Hypoalbuminemia:  S/p IV albumin 9/22. Alb rose to 3.1 post-infusion but back down to 2.8 this AM.  Consider additional albumin rx as edema remains out of proportion to symptoms.  6. Elevated HsTroponin/Demand ischemia:  86  83 in setting of massive volume overload.  She did report occas chest pressure @ home.  No prior ischemic eval but cor Ca2+ noted on prior CT.  With advanced age and CKD IV, she is a poor candidate for cath, so medical rx +/- outpt non-invasive ischemic eval to guide mgmt appropriate.  7.  Microcytic anemia:  Stable.  Signed, Murray Hodgkins, NP  06/27/2020, 8:31 AM     Patient seen and exmined   I agree with findings as noted by C Berge above   Pt currently resting in recliner   Appears comfortable ON exam,  HR 80s (afib)  SBP 99-110s/ Neck with increased JVP  Cardiac Irreg irreg   No signif murmurs  Lungs are rel clear Abd  Obese  SOme flank edema Ext with 2+ edema     Patient still with volume increae on exam  (I/O difficult)  REnal function is worsening however   Await input from renal service   Patient remains in afib  Rates controlled   Plan to back down a bit on b blocker to 75 mg daily  and follow BP   Will continue to follow   For questions or updates, please contact   Please consult [email or web address] for contact info under Cardiology/STEMI.

## 2020-06-27 NOTE — Progress Notes (Signed)
PROGRESS NOTE    Kristy Solomon  ZOX:[id number] DOB: 1929/03/06 DOA: 06/14/2020 PCP: Jodi Marble, MD   Brief Narrative:  Ms. Kennebrew is an 86 yo AA female with PMH chronic respiratory failure (on 2L [place] O2), HFpEF, permanent afib (on Xarelto), HTN, CKDIV HLD, hx lymphoma, anemia of chronic disease who presented to the hospital with worsening SOB. She had been out of her medications at home for approx a couple weeks.  She was found to be volume overloaded and in afib with RVR. Nephrology and cardiology were consulted on admission.  A CXR was obtained which revealed bilateral pleural effusions and pulmonary edema.  She was started on IV Lasix and resumed back on her beta-blocker. She diuresed well and clinically responded to treatment with improved SOB and swelling.   Subjective: Patient has no new complaint today.  She was asking about going home.  Assessment & Plan:   Principal Problem:   Acute on chronic heart failure with preserved ejection fraction (HFpEF) (HCC) Active Problems:   Waldenstrom macroglobulinemia (HCC)   Thrombocytopenia (HCC)   HTN (hypertension)   GERD (gastroesophageal reflux disease)   CKD (chronic kidney disease), stage IV (HCC)   Atrial fibrillation with rapid ventricular response (HCC)   Non compliance w medication regimen   Permanent atrial fibrillation (HCC)  Acute on chronic heart failure with preserved ejection fraction (HFpEF) ([place]).  Cardiology is following-appreciate their recommendation. Patient has a net negative of 4L with a weight change of about 8 to 10 pounds. Initially treated with IV Lasix and then it was held for couple of days due to worsening renal function.  Home dose of torsemide was restarted by cardiology.  She will be switched to Lasix infusion due to worsening renal function today and continue to have volume overload. -Metoprolol dose was decreased to 75 by cardiology. -Continue with strict intake and output and daily  weight. -PT is recommending home health services-ordered.  Hypotension.  Resolved .  Patient developed some hypotension and appears more lethargic than her usual.  There was a huge difference between blood pressure measured on right and left upper extremities.  Right systolic mostly in the 81X with left systolic in 91Y and 78G.  No prior diagnosis of coarctation or subclavian stenosis.  Atrial fibrillation with rapid ventricular response ([place]).  Most likely secondary to being out of medications at home.  Currently rate well controlled. -Continue home dose of Toprol and Xarelto. -Metoprolol dose was decreased today.  GERD (gastroesophageal reflux disease) - continue PPI  HTN (hypertension).  Blood pressure within goal today. -We will need to make sure that patient can afford meds at discharge.  AKI with CKD stage IV.  Most likely secondary to cardiorenal syndrome.  Baseline creatinine around 1.7 per nephrology. Nephrology is following-appreciate their recommendations. Worsening creatinine after resuming torsemide. Torsemide was discontinued by nephrology and she was started on Lasix infusion for gentle diuresis as she remains volume overload.  Albumin was also added by nephrology. -Continue to monitor. -Avoid nephrotoxins  Thrombocytopenia ([place]).  Seems stable and improving. - patient has mild chronic thrombocytopenia at baseline ~120-140k - etiology due to her underlying Waldenstrom's macroglobulinemia and effect from ibrutinib; follows with oncology - okay to continue xarelto for now   Waldenstrom macroglobulinemia [facility]) - follows with oncology; last OV 04/23/20 - continue ibrutinib   Objective: Vitals:   06/27/20 1156 06/27/20 1200 06/27/20 1230 06/27/20 1553  BP: (!) 99/50 112/63 114/77 98/64  Pulse: (!) 106 73 88 68  Resp:  19   19  Temp: 98.3 F (36.8 C)   98.8 F (37.1 C)  TempSrc: Oral   Oral  SpO2: 99% 100% 100% 100%  Weight:      Height:        Intake/Output  Summary (Last 24 hours) at 06/27/2020 1626 Last data filed at 06/27/2020 1350 Gross per 24 hour  Intake 480 ml  Output 750 ml  Net -270 ml   Filed Weights   06/25/20 0354 06/26/20 0520 06/27/20 0317  Weight: 98.6 kg 99.7 kg 101.2 kg    Examination:  General.  Chronically ill-appearing elderly lady, in no acute distress. Pulmonary.  Few basal crackles bilaterally, normal respiratory effort. CV.  Regular rate and rhythm, no JVD, rub or murmur. Abdomen.  Soft, nontender, nondistended, BS positive. CNS.  Alert and oriented .  No focal neurologic deficit. Extremities.  2+ LE edema, no cyanosis, pulses intact and symmetrical. Psychiatry.  Judgment and insight appears normal.  DVT prophylaxis: Xarelto Code Status: Full Family Communication: Discussed with patient Disposition Plan:  Status is: Inpatient  Remains inpatient appropriate because:Inpatient level of care appropriate due to severity of illness   Dispo: The patient is from: Home              Anticipated d/c is to: Home              Anticipated d/c date is: 2-3 days.              Patient currently is not medically stable to d/c.  Patient is high risk for readmission.  Remains volume overload.  History of being noncompliant.  Consultants:   Cardiology  Nephrology  Procedures:  Antimicrobials:   Data Reviewed: I have personally reviewed following labs and imaging studies  CBC: Recent Labs  Lab 06/21/20 0405 06/21/20 0405 06/22/20 0607 06/23/20 0658 06/24/20 0700 06/25/20 0602 06/27/20 0554  WBC 5.0   < > 5.8 6.1 6.3 5.5 5.0  NEUTROABS 3.0  --  3.8 4.1 4.4 3.1  --   HGB 11.3*   < > 11.8* 12.7 11.6* 11.3* 11.1*  HCT 37.0   < > 39.8 41.5 39.3 37.3 37.1  MCV 74.4*   < > 75.7* 72.9* 75.1* 74.2* 74.8*  PLT 86*   < > 97* 106* 108* 113* 134*   < > = values in this interval not displayed.   Basic Metabolic Panel: Recent Labs  Lab 06/21/20 0405 06/21/20 0405 06/22/20 8182 06/22/20 9937 06/23/20 0658  06/24/20 0700 06/25/20 0602 06/26/20 0554 06/27/20 0554  NA 141   < > 143   < > 142 142 140 142 141  K 4.1   < > 3.8   < > 4.1 3.9 3.9 3.6 3.7  CL 109   < > 109   < > 107 107 105 106 105  CO2 25   < > 26   < > 24 27 27 27 27   GLUCOSE 124*   < > 135*   < > 102* 97 103* 99 106*  BUN 43*   < > 43*   < > 47* 51* 53* 52* 58*  CREATININE 2.05*   < > 2.00*   < > 2.22* 2.25* 2.11* 2.26* 2.33*  CALCIUM 8.2*   < > 8.3*   < > 8.3* 8.1* 8.4* 8.5* 8.4*  MG 2.0  --  2.1  --  2.0 2.1 2.1  --   --   PHOS  --   --   --   --   --   --  4.1 4.0 3.8   < > = values in this interval not displayed.   GFR: Estimated Creatinine Clearance: 19.3 mL/min (A) (by C-G formula based on SCr of 2.33 mg/dL (H)). Liver Function Tests: Recent Labs  Lab 06/24/20 0700 06/25/20 0602 06/26/20 0554 06/27/20 0554  ALBUMIN 2.4* 3.1* 3.0* 2.8*   No results for input(s): LIPASE, AMYLASE in the last 168 hours. No results for input(s): AMMONIA in the last 168 hours. Coagulation Profile: No results for input(s): INR, PROTIME in the last 168 hours. Cardiac Enzymes: No results for input(s): CKTOTAL, CKMB, CKMBINDEX, TROPONINI in the last 168 hours. BNP (last 3 results) No results for input(s): PROBNP in the last 8760 hours. HbA1C: No results for input(s): HGBA1C in the last 72 hours. CBG: No results for input(s): GLUCAP in the last 168 hours. Lipid Profile: No results for input(s): CHOL, HDL, LDLCALC, TRIG, CHOLHDL, LDLDIRECT in the last 72 hours. Thyroid Function Tests: No results for input(s): TSH, T4TOTAL, FREET4, T3FREE, THYROIDAB in the last 72 hours. Anemia Panel: No results for input(s): VITAMINB12, FOLATE, FERRITIN, TIBC, IRON, RETICCTPCT in the last 72 hours. Sepsis Labs: Recent Labs  Lab 06/21/20 0405 06/22/20 0607  PROCALCITON <0.10 <0.10    Recent Results (from the past 240 hour(s))  Culture, blood (routine x 2)     Status: Abnormal   Collection Time: 06/05/2020  3:40 AM   Specimen: BLOOD  Result  Value Ref Range Status   Specimen Description   Final    BLOOD LEFT [facility] Performed at [facility], [address], [place], [place] [address]    Special Requests   Final    BOTTLES DRAWN AEROBIC AND ANAEROBIC Blood Culture adequate volume Performed at [facility], [address], [place], [place] [address]    Culture  Setup Time   Final    GRAM POSITIVE COCCI AEROBIC BOTTLE ONLY CRITICAL VALUE NOTED.  VALUE IS CONSISTENT WITH PREVIOUSLY REPORTED AND CALLED VALUE. Performed at [facility], [address] [place], [place] [address]    Culture (A)  Final    STAPHYLOCOCCUS CAPITIS STAPHYLOCOCCUS EPIDERMIDIS    Report Status 06/23/2020 FINAL  Final   Organism ID, Bacteria STAPHYLOCOCCUS EPIDERMIDIS  Final      Susceptibility   Staphylococcus epidermidis - MIC*    CIPROFLOXACIN <=0.5 SENSITIVE Sensitive     ERYTHROMYCIN >=8 RESISTANT Resistant     GENTAMICIN <=0.5 SENSITIVE Sensitive     OXACILLIN <=0.25 SENSITIVE Sensitive     TETRACYCLINE <=1 SENSITIVE Sensitive     VANCOMYCIN 1 SENSITIVE Sensitive     TRIMETH/SULFA <=10 SENSITIVE Sensitive     CLINDAMYCIN <=0.25 SENSITIVE Sensitive     RIFAMPIN <=0.5 SENSITIVE Sensitive     Inducible Clindamycin NEGATIVE Sensitive     * STAPHYLOCOCCUS EPIDERMIDIS  Culture, blood (routine x 2)     Status: Abnormal   Collection Time: 06/22/2020  3:40 AM   Specimen: BLOOD  Result Value Ref Range Status   Specimen Description   Final    BLOOD RIGHT FA Performed at [facility], [address] [place], [place] [address]    Special Requests   Final    BOTTLES DRAWN AEROBIC AND ANAEROBIC Blood Culture adequate volume Performed at [facility], [address] [place], [place] [address]    Culture  Setup Time   Final    GRAM POSITIVE COCCI IN BOTH AEROBIC AND ANAEROBIC BOTTLES CRITICAL RESULT CALLED TO, READ BACK BY AND VERIFIED WITH: SUSAN WATSON AT  8250 06/12/2020.PMF    Culture (A)  Final     STAPHYLOCOCCUS HOMINIS STAPHYLOCOCCUS EPIDERMIDIS THE SIGNIFICANCE OF ISOLATING THIS ORGANISM FROM A SINGLE SET OF BLOOD CULTURES WHEN MULTIPLE SETS ARE DRAWN IS UNCERTAIN. PLEASE NOTIFY THE MICROBIOLOGY DEPARTMENT WITHIN ONE WEEK IF SPECIATION AND SENSITIVITIES ARE REQUIRED. Performed at [facility], [place] [address] [place], [place] [address]    Report Status 06/23/2020 FINAL  Final  Blood Culture ID Panel (Reflexed)     Status: Abnormal   Collection Time: 06/30/2020  3:40 AM  Result Value Ref Range Status   Enterococcus faecalis NOT DETECTED NOT DETECTED Final   Enterococcus Faecium NOT DETECTED NOT DETECTED Final   Listeria monocytogenes NOT DETECTED NOT DETECTED Final   Staphylococcus species DETECTED (A) NOT DETECTED Final    Comment: CRITICAL RESULT CALLED TO, READ BACK BY AND VERIFIED WITH: SUSAN WATSON AT 1833 06/07/2020.PMF    Staphylococcus aureus (BCID) NOT DETECTED NOT DETECTED Final   Staphylococcus epidermidis DETECTED (A) NOT DETECTED Final    Comment: Methicillin (oxacillin) resistant coagulase negative staphylococcus. Possible blood culture contaminant (unless isolated from more than one blood culture draw or clinical case suggests pathogenicity). No antibiotic treatment is indicated for blood  culture contaminants. CRITICAL RESULT CALLED TO, READ BACK BY AND VERIFIED WITH: SUSAN WATSON AT 7341 06/22/2020.PMF    Staphylococcus lugdunensis NOT DETECTED NOT DETECTED Final   Streptococcus species NOT DETECTED NOT DETECTED Final   Streptococcus agalactiae NOT DETECTED NOT DETECTED Final   Streptococcus pneumoniae NOT DETECTED NOT DETECTED Final   Streptococcus pyogenes NOT DETECTED NOT DETECTED Final   A.calcoaceticus-baumannii NOT DETECTED NOT DETECTED Final   Bacteroides fragilis NOT DETECTED NOT DETECTED Final   Enterobacterales NOT DETECTED NOT DETECTED Final   Enterobacter cloacae complex NOT DETECTED NOT DETECTED Final   Escherichia coli NOT DETECTED NOT DETECTED  Final   Klebsiella aerogenes NOT DETECTED NOT DETECTED Final   Klebsiella oxytoca NOT DETECTED NOT DETECTED Final   Klebsiella pneumoniae NOT DETECTED NOT DETECTED Final   Proteus species NOT DETECTED NOT DETECTED Final   Salmonella species NOT DETECTED NOT DETECTED Final   Serratia marcescens NOT DETECTED NOT DETECTED Final   Haemophilus influenzae NOT DETECTED NOT DETECTED Final   Neisseria meningitidis NOT DETECTED NOT DETECTED Final   Pseudomonas aeruginosa NOT DETECTED NOT DETECTED Final   Stenotrophomonas maltophilia NOT DETECTED NOT DETECTED Final   Candida albicans NOT DETECTED NOT DETECTED Final   Candida auris NOT DETECTED NOT DETECTED Final   Candida glabrata NOT DETECTED NOT DETECTED Final   Candida krusei NOT DETECTED NOT DETECTED Final   Candida parapsilosis NOT DETECTED NOT DETECTED Final   Candida tropicalis NOT DETECTED NOT DETECTED Final   Cryptococcus neoformans/gattii NOT DETECTED NOT DETECTED Final   Methicillin resistance mecA/C DETECTED (A) NOT DETECTED Final    Comment: CRITICAL RESULT CALLED TO, READ BACK BY AND VERIFIED WITH: SUSAN WATSON AT 1833 06/11/2020.PMF Performed at [facility], [address] [place], [place] [address]   SARS Coronavirus 2 by RT PCR (hospital order, performed in [facility] hospital lab) Nasopharyngeal Nasopharyngeal Swab     Status: None   Collection Time: 06/27/2020 12:01 PM   Specimen: Nasopharyngeal Swab  Result Value Ref Range Status   SARS Coronavirus 2 NEGATIVE NEGATIVE Final    Comment: (NOTE) SARS-CoV-2 target nucleic acids are NOT DETECTED.  The SARS-CoV-2 RNA is generally detectable in upper and lower respiratory specimens during the acute phase of infection. The lowest concentration of SARS-CoV-2 viral  copies this assay can detect is 250 copies / mL. A negative result does not preclude SARS-CoV-2 infection and should not be used as the sole basis for treatment or other patient management decisions.  A negative  result may occur with improper specimen collection / handling, submission of specimen other than nasopharyngeal swab, presence of viral mutation(s) within the areas targeted by this assay, and inadequate number of viral copies (<250 copies / mL). A negative result must be combined with clinical observations, patient history, and epidemiological information.  Fact Sheet for Patients:   StrictlyIdeas.no  Fact Sheet for Healthcare Providers: BankingDealers.co.za  This test is not yet approved or  cleared by the [place] FDA and has been authorized for detection and/or diagnosis of SARS-CoV-2 by FDA under an Emergency Use Authorization (EUA).  This EUA will remain in effect (meaning this test can be used) for the duration of the COVID-19 declaration under Section 564(b)(1) of the Act, 21 U.S.C. section 360bbb-3(b)(1), unless the authorization is terminated or revoked sooner.  Performed at [facility] [address] [place], [place] [address]   CULTURE, BLOOD (ROUTINE X 2) w Reflex to ID Panel     Status: None   Collection Time: 06/20/20  5:34 PM   Specimen: BLOOD  Result Value Ref Range Status   Specimen Description BLOOD [facility]  Final   Special Requests   Final    BOTTLES DRAWN AEROBIC AND ANAEROBIC Blood Culture adequate volume   Culture   Final    NO GROWTH 5 DAYS Performed at [facility], [address] [place], [place] [address]    Report Status 06/25/2020 FINAL  Final  CULTURE, BLOOD (ROUTINE X 2) w Reflex to ID Panel     Status: None   Collection Time: 06/20/20  5:34 PM   Specimen: BLOOD  Result Value Ref Range Status   Specimen Description BLOOD RAC  Final   Special Requests   Final    BOTTLES DRAWN AEROBIC AND ANAEROBIC Blood Culture adequate volume   Culture   Final    NO GROWTH 5 DAYS Performed at [facility], [address], [place], [place] [address]    Report Status 06/25/2020  FINAL  Final     Radiology Studies: No results found.  Scheduled Meds: . allopurinol  50 mg Oral Daily  . atorvastatin  20 mg Oral Daily  . ferrous sulfate  325 mg Oral Q breakfast  . gabapentin  300 mg Oral TID  . metoprolol succinate  75 mg Oral Daily  . polyethylene glycol  17 g Oral Daily  . Rivaroxaban  15 mg Oral Q supper  . senna-docusate  1 tablet Oral BID  . sodium chloride flush  3 mL Intravenous Q12H  . torsemide  40 mg Oral Daily   Continuous Infusions: . sodium chloride       LOS: 8 days   Time spent: 30 minutes.  Lorella Nimrod, MD [facility]  If 7PM-7AM, please contact night-coverage [email or web address]  06/27/2020, 4:26 PM   This record has been created using Systems analyst. Errors have been sought and corrected,but may not always be located. Such creation errors do not reflect on the standard of care.

## 2020-06-28 ENCOUNTER — Inpatient Hospital Stay: Payer: Medicare HMO

## 2020-06-28 LAB — RENAL FUNCTION PANEL
Albumin: 2.9 g/dL — ABNORMAL LOW (ref 3.5–5.0)
Anion gap: 10 (ref 5–15)
BUN: 64 mg/dL — ABNORMAL HIGH (ref 8–23)
CO2: 27 mmol/L (ref 22–32)
Calcium: 8.6 mg/dL — ABNORMAL LOW (ref 8.9–10.3)
Chloride: 104 mmol/L (ref 98–111)
Creatinine, Ser: 2.64 mg/dL — ABNORMAL HIGH (ref 0.44–1.00)
GFR calc Af Amer: 18 mL/min — ABNORMAL LOW (ref >60)
GFR calc non Af Amer: 15 mL/min — ABNORMAL LOW (ref >60)
Glucose, Bld: 108 mg/dL — ABNORMAL HIGH (ref 70–99)
Phosphorus: 4.3 mg/dL (ref 2.5–4.6)
Potassium: 3.8 mmol/L (ref 3.5–5.1)
Sodium: 141 mmol/L (ref 135–145)

## 2020-06-28 NOTE — Progress Notes (Signed)
Kristy Solomon  MRN: [id number]  DOB/AGE: May 04, 1929 86 y.o.  Primary Care Physician:Tejan-Sie, Brandt Loosen, MD  Admit date: 07/02/2020  Chief Complaint:  Chief Complaint  Patient presents with  . Shortness of Breath    S-Pt presented on  07/01/2020 with  Chief Complaint  Patient presents with  . Shortness of Breath  . Patient main complaint in today visit was-she was feeling better than yesterday   Medications  . allopurinol  50 mg Oral Daily  . atorvastatin  20 mg Oral Daily  . ferrous sulfate  325 mg Oral Q breakfast  . furosemide  80 mg Intravenous Q12H  . gabapentin  300 mg Oral TID  . metoprolol succinate  75 mg Oral Daily  . polyethylene glycol  17 g Oral Daily  . Rivaroxaban  15 mg Oral Q supper  . senna-docusate  1 tablet Oral BID  . sodium chloride flush  3 mL Intravenous Q12H         TIW:PYKDX from the symptoms mentioned above,there are no other symptoms referable to all systems reviewed.  Physical Exam: Vital signs in last 24 hours: Temp:  [97.8 F (36.6 C)-98.9 F (37.2 C)] 98.2 F (36.8 C) (09/26 0756) Pulse Rate:  [52-106] 87 (09/26 0756) Resp:  [17-20] 20 (09/26 0756) BP: (86-122)/(50-77) 120/70 (09/26 0756) SpO2:  [87 %-100 %] 100 % (09/26 0543) Weight change:  Last BM Date: 06/25/20  Intake/Output from previous day: 09/25 0701 - 09/26 0700 In: 720 [P.O.:720] Out: 850 [Urine:850] No intake/output data recorded.   Physical Exam: General- pt is awake,alert, oriented to time place and person Resp- No acute REsp distress, decreased breath sound at bases  CVS- S1S2 regular in rate and rhythm GIT- BS+, soft, NT, ND EXT- 2+ LE Edema, Cyanosis   Lab Results: CBC Recent Labs    06/27/20 0554  WBC 5.0  HGB 11.1*  HCT 37.1  PLT 134*    BMET Recent Labs    06/27/20 0554 06/28/20 0454  NA 141 141  K 3.7 3.8  CL 105 104  CO2 27 27  GLUCOSE 106* 108*  BUN 58* 64*  CREATININE 2.33* 2.64*  CALCIUM 8.4* 8.6*    Creatinine  trend 2021 2.0--2.3==>2.6 1.6--1.7-baseline 1.5--2.5-late March/April admission  2020 1.6--2.3 2019 1.4--1.7 2018 1.2--1.3 2017 1.2--1.8 2015 1.6--1.8 2014 1.2--1.5 2013 1.2--1.8   MICRO Recent Results (from the past 240 hour(s))  Culture, blood (routine x 2)     Status: Abnormal   Collection Time: 06/14/2020  3:40 AM   Specimen: BLOOD  Result Value Ref Range Status   Specimen Description   Final    BLOOD LEFT [facility] Performed at [facility], [address], [place], [place] [address]    Special Requests   Final    BOTTLES DRAWN AEROBIC AND ANAEROBIC Blood Culture adequate volume Performed at [facility], [address] [place], [place] [address]    Culture  Setup Time   Final    GRAM POSITIVE COCCI AEROBIC BOTTLE ONLY CRITICAL VALUE NOTED.  VALUE IS CONSISTENT WITH PREVIOUSLY REPORTED AND CALLED VALUE. Performed at [facility], [address] [place], [place] [address]    Culture (A)  Final    STAPHYLOCOCCUS CAPITIS STAPHYLOCOCCUS EPIDERMIDIS    Report Status 06/23/2020 FINAL  Final   Organism ID, Bacteria STAPHYLOCOCCUS EPIDERMIDIS  Final      Susceptibility   Staphylococcus epidermidis - MIC*    CIPROFLOXACIN <=0.5 SENSITIVE Sensitive     ERYTHROMYCIN >=8 RESISTANT  Resistant     GENTAMICIN <=0.5 SENSITIVE Sensitive     OXACILLIN <=0.25 SENSITIVE Sensitive     TETRACYCLINE <=1 SENSITIVE Sensitive     VANCOMYCIN 1 SENSITIVE Sensitive     TRIMETH/SULFA <=10 SENSITIVE Sensitive     CLINDAMYCIN <=0.25 SENSITIVE Sensitive     RIFAMPIN <=0.5 SENSITIVE Sensitive     Inducible Clindamycin NEGATIVE Sensitive     * STAPHYLOCOCCUS EPIDERMIDIS  Culture, blood (routine x 2)     Status: Abnormal   Collection Time: 06/25/2020  3:40 AM   Specimen: BLOOD  Result Value Ref Range Status   Specimen Description   Final    BLOOD RIGHT FA Performed at [facility], [address], [place], [place] [address]    Special Requests   Final     BOTTLES DRAWN AEROBIC AND ANAEROBIC Blood Culture adequate volume Performed at [facility], [address] [place], [place] [address]    Culture  Setup Time   Final    GRAM POSITIVE COCCI IN BOTH AEROBIC AND ANAEROBIC BOTTLES CRITICAL RESULT CALLED TO, READ BACK BY AND VERIFIED WITH: SUSAN WATSON AT 1833 06/24/2020.PMF    Culture (A)  Final    STAPHYLOCOCCUS HOMINIS STAPHYLOCOCCUS EPIDERMIDIS THE SIGNIFICANCE OF ISOLATING THIS ORGANISM FROM A SINGLE SET OF BLOOD CULTURES WHEN MULTIPLE SETS ARE DRAWN IS UNCERTAIN. PLEASE NOTIFY THE MICROBIOLOGY DEPARTMENT WITHIN ONE WEEK IF SPECIATION AND SENSITIVITIES ARE REQUIRED. Performed at [facility], [place] [address], [place], [place] [address]    Report Status 06/23/2020 FINAL  Final  Blood Culture ID Panel (Reflexed)     Status: Abnormal   Collection Time: 06/05/2020  3:40 AM  Result Value Ref Range Status   Enterococcus faecalis NOT DETECTED NOT DETECTED Final   Enterococcus Faecium NOT DETECTED NOT DETECTED Final   Listeria monocytogenes NOT DETECTED NOT DETECTED Final   Staphylococcus species DETECTED (A) NOT DETECTED Final    Comment: CRITICAL RESULT CALLED TO, READ BACK BY AND VERIFIED WITH: SUSAN WATSON AT 1833 06/16/2020.PMF    Staphylococcus aureus (BCID) NOT DETECTED NOT DETECTED Final   Staphylococcus epidermidis DETECTED (A) NOT DETECTED Final    Comment: Methicillin (oxacillin) resistant coagulase negative staphylococcus. Possible blood culture contaminant (unless isolated from more than one blood culture draw or clinical case suggests pathogenicity). No antibiotic treatment is indicated for blood  culture contaminants. CRITICAL RESULT CALLED TO, READ BACK BY AND VERIFIED WITH: SUSAN WATSON AT 0932 06/17/2020.PMF    Staphylococcus lugdunensis NOT DETECTED NOT DETECTED Final   Streptococcus species NOT DETECTED NOT DETECTED Final   Streptococcus agalactiae NOT DETECTED NOT DETECTED Final   Streptococcus pneumoniae NOT  DETECTED NOT DETECTED Final   Streptococcus pyogenes NOT DETECTED NOT DETECTED Final   A.calcoaceticus-baumannii NOT DETECTED NOT DETECTED Final   Bacteroides fragilis NOT DETECTED NOT DETECTED Final   Enterobacterales NOT DETECTED NOT DETECTED Final   Enterobacter cloacae complex NOT DETECTED NOT DETECTED Final   Escherichia coli NOT DETECTED NOT DETECTED Final   Klebsiella aerogenes NOT DETECTED NOT DETECTED Final   Klebsiella oxytoca NOT DETECTED NOT DETECTED Final   Klebsiella pneumoniae NOT DETECTED NOT DETECTED Final   Proteus species NOT DETECTED NOT DETECTED Final   Salmonella species NOT DETECTED NOT DETECTED Final   Serratia marcescens NOT DETECTED NOT DETECTED Final   Haemophilus influenzae NOT DETECTED NOT DETECTED Final   Neisseria meningitidis NOT DETECTED NOT DETECTED Final   Pseudomonas aeruginosa NOT DETECTED NOT DETECTED Final   Stenotrophomonas maltophilia NOT DETECTED NOT  DETECTED Final   Candida albicans NOT DETECTED NOT DETECTED Final   Candida auris NOT DETECTED NOT DETECTED Final   Candida glabrata NOT DETECTED NOT DETECTED Final   Candida krusei NOT DETECTED NOT DETECTED Final   Candida parapsilosis NOT DETECTED NOT DETECTED Final   Candida tropicalis NOT DETECTED NOT DETECTED Final   Cryptococcus neoformans/gattii NOT DETECTED NOT DETECTED Final   Methicillin resistance mecA/C DETECTED (A) NOT DETECTED Final    Comment: CRITICAL RESULT CALLED TO, READ BACK BY AND VERIFIED WITH: SUSAN WATSON AT 1833 06/12/2020.PMF Performed at [facility], [address] [place], [place] [address]   SARS Coronavirus 2 by RT PCR (hospital order, performed in [facility] hospital lab) Nasopharyngeal Nasopharyngeal Swab     Status: None   Collection Time: 06/13/2020 12:01 PM   Specimen: Nasopharyngeal Swab  Result Value Ref Range Status   SARS Coronavirus 2 NEGATIVE NEGATIVE Final    Comment: (NOTE) SARS-CoV-2 target nucleic acids are NOT DETECTED.  The SARS-CoV-2 RNA  is generally detectable in upper and lower respiratory specimens during the acute phase of infection. The lowest concentration of SARS-CoV-2 viral copies this assay can detect is 250 copies / mL. A negative result does not preclude SARS-CoV-2 infection and should not be used as the sole basis for treatment or other patient management decisions.  A negative result may occur with improper specimen collection / handling, submission of specimen other than nasopharyngeal swab, presence of viral mutation(s) within the areas targeted by this assay, and inadequate number of viral copies (<250 copies / mL). A negative result must be combined with clinical observations, patient history, and epidemiological information.  Fact Sheet for Patients:   StrictlyIdeas.no  Fact Sheet for Healthcare Providers: BankingDealers.co.za  This test is not yet approved or  cleared by the [place] FDA and has been authorized for detection and/or diagnosis of SARS-CoV-2 by FDA under an Emergency Use Authorization (EUA).  This EUA will remain in effect (meaning this test can be used) for the duration of the COVID-19 declaration under Section 564(b)(1) of the Act, 21 U.S.C. section 360bbb-3(b)(1), unless the authorization is terminated or revoked sooner.  Performed at [facility], [address] [place], [place] [address]   CULTURE, BLOOD (ROUTINE X 2) w Reflex to ID Panel     Status: None   Collection Time: 06/20/20  5:34 PM   Specimen: BLOOD  Result Value Ref Range Status   Specimen Description BLOOD [facility]  Final   Special Requests   Final    BOTTLES DRAWN AEROBIC AND ANAEROBIC Blood Culture adequate volume   Culture   Final    NO GROWTH 5 DAYS Performed at [facility], [address] [place], [place] [address]    Report Status 06/25/2020 FINAL  Final  CULTURE, BLOOD (ROUTINE X 2) w Reflex to ID Panel     Status: None   Collection  Time: 06/20/20  5:34 PM   Specimen: BLOOD  Result Value Ref Range Status   Specimen Description BLOOD RAC  Final   Special Requests   Final    BOTTLES DRAWN AEROBIC AND ANAEROBIC Blood Culture adequate volume   Culture   Final    NO GROWTH 5 DAYS Performed at [facility], [address], [place], [place] [address]    Report Status 06/25/2020 FINAL  Final      Lab Results  Component Value Date   CALCIUM 8.6 (L) 06/28/2020   PHOS 4.3 06/28/2020  Impression:   Ms. PRACHI OFTEDAHL is a 86 y.o. black female with congestive heart failure, hypertension, atrial fibrillation, hyperlipidemia, history of lymphoma, who was admitted to [facility] on9/17/2021for  Hypoxia [R09.02] Elevated TSH [R79.89] Acute CHF (congestive heart failure) ([place]) [I50.9] Acute on chronic congestive heart failure, unspecified heart failure type ([place]) [I50.9]  1)Renal  AKI secondary to cardiorenal syndrome Patient has AKI on CKD Patient has CKD stage IV Patient has CKD since 2013 Patient has CKD most likely secondary to hypertension There is possible contribution from age associated decline as patient is 86 years old There is also some contribution from multiple episodes of AKI      2)Hypotension Blood pressure is stable Patient will benefit from midodrine   3)Anemia of chronic disease  HGb at goal (9--11) No need for Epogen  4) acute on chronic diastolic CHF Patient was earlier on IV Lasix Patient was then transitioned to p.o. torsemide As per the data available patient is more than 5 L negative since admission. Patient peak weight was 103.2 kg which came down to 98.6 kg and is now 101.2 kg on 06/27/20 l DCed p.o. torsemide and started patient on IV Lasix 80 mg po Q12h  On  06/27/20 Patient is 500 mg heavier than yesterday though more negative than yesterday  We will continue IV diuresis for now  5) thrombocytopenia Platelet counts are stable  6) electrolytes    sodium Normonatremic   potassium Normokalemic    7)Acid base Co2 at goal     Plan:   Patient standing weight still on the higher side.  Though she is more negative today than yesterday This is a conflicting data with the patient is more negative she should have lost weight. We will ask for chest x-ray to help with fluid assessment as well We will continue the current diuretic regimen We will continue to follow    Zaylin Runco s [facility] 06/28/2020, 7:58 AM

## 2020-06-28 NOTE — Progress Notes (Signed)
PROGRESS NOTE    Kristy Solomon  HYW:[id number] DOB: [birth year] DOA: 06/05/2020 PCP: Kristy Marble, MD   Brief Narrative:  Kristy Solomon is an 86 yo AA female with PMH chronic respiratory failure (on 2L [place] O2), HFpEF, permanent afib (on Xarelto), HTN, CKDIV HLD, hx lymphoma, anemia of chronic disease who presented to the hospital with worsening SOB. She had been out of her medications at home for approx a couple weeks.  She was found to be volume overloaded and in afib with RVR. Nephrology and cardiology were consulted on admission.  A CXR was obtained which revealed bilateral pleural effusions and pulmonary edema.  She was started on IV Lasix and resumed back on her beta-blocker. She diuresed well and clinically responded to treatment with improved SOB and swelling.   Subjective: Patient was sleeping when seen today.  Stating that she is tired and want to get some sleep.  Assessment & Plan:   Principal Problem:   Acute on chronic heart failure with preserved ejection fraction (HFpEF) (HCC) Active Problems:   Waldenstrom macroglobulinemia (HCC)   Thrombocytopenia (HCC)   HTN (hypertension)   GERD (gastroesophageal reflux disease)   CKD (chronic kidney disease), stage IV (HCC)   Atrial fibrillation with rapid ventricular response (HCC)   Non compliance w medication regimen   Permanent atrial fibrillation (HCC)  Acute on chronic heart failure with preserved ejection fraction (HFpEF) ([place]).  Cardiology is following-appreciate their recommendation. Patient has a net negative of 5L.  Weight seems increasing. Initially treated with IV Lasix and then it was held for couple of days due to worsening renal function.  Home dose of torsemide was restarted by cardiology.   Nephrology restarted her on IV Lasix 80 mg twice daily and would like to continue today despite worsening creatinine. -Metoprolol dose was decreased to 75 by cardiology. -Continue with strict intake and output and daily  weight. -PT is recommending home health services-ordered.  Hypotension.  Resolved .  Patient developed some hypotension and appears more lethargic than her usual.  There was a huge difference between blood pressure measured on right and left upper extremities.  Right systolic mostly in the 48N with left systolic in 46E and 70J.  No prior diagnosis of coarctation or subclavian stenosis.  Atrial fibrillation with rapid ventricular response ([place]).  Most likely secondary to being out of medications at home.  Currently rate well controlled. -Continue home dose of Toprol and Xarelto. -Metoprolol dose was decreased today.  GERD (gastroesophageal reflux disease) - continue PPI  HTN (hypertension).  Blood pressure within goal today. -We will need to make sure that patient can afford meds at discharge.  AKI with CKD stage IV.  Most likely secondary to cardiorenal syndrome.  Baseline creatinine around 1.7 per nephrology. Nephrology is following-appreciate their recommendations. Worsening creatinine after resuming torsemide. Torsemide was discontinued by nephrology and she was started on Lasix infusion for gentle diuresis as she remains volume overload.  Albumin was also added by nephrology. -Continue to monitor. -Avoid nephrotoxins  Thrombocytopenia ([place]).  Seems stable and improving. - patient has mild chronic thrombocytopenia at baseline ~120-140k - etiology due to her underlying Waldenstrom's macroglobulinemia and effect from ibrutinib; follows with oncology - okay to continue xarelto for now   Waldenstrom macroglobulinemia [facility]) - follows with oncology; last OV 04/23/20 - continue ibrutinib   Objective: Vitals:   06/28/20 0756 06/28/20 0835 06/28/20 1141 06/28/20 1231  BP: 120/70  (!) 90/59   Pulse: 87  91   Resp: 20  17   Temp: 98.2 F (36.8 C)  98.6 F (37 C)   TempSrc: Oral  Oral   SpO2: 99%  98% 99%  Weight:  101.7 kg    Height:        Intake/Output Summary (Last 24  hours) at 06/28/2020 1524 Last data filed at 06/28/2020 1015 Gross per 24 hour  Intake 480 ml  Output 850 ml  Net -370 ml   Filed Weights   06/26/20 0520 06/27/20 0317 06/28/20 0835  Weight: 99.7 kg 101.2 kg 101.7 kg    Examination:  [place] appearing elderly lady, in no acute distress. Pulmonary.  Lungs clear bilaterally, decreased breath sound at bases, normal respiratory effort. CV.  Regular rate and rhythm, no JVD, rub or murmur. Abdomen.  Soft, nontender, nondistended, BS positive. CNS.  Alert and oriented . No focal neurologic deficit. Extremities.  2+ LE edema, no cyanosis, pulses intact and symmetrical. Psychiatry.  Judgment and insight appears normal.  DVT prophylaxis: Xarelto Code Status: Full Family Communication: Discussed with patient Disposition Plan:  Status is: Inpatient  Remains inpatient appropriate because:Inpatient level of care appropriate due to severity of illness   Dispo: The patient is from: Home              Anticipated d/c is to: Home              Anticipated d/c date is: 2-3 days.              Patient currently is not medically stable to d/c.  Patient is high risk for readmission.  Remains volume overload.  History of being noncompliant.  Consultants:   Cardiology  Nephrology  Procedures:  Antimicrobials:   Data Reviewed: I have personally reviewed following labs and imaging studies  CBC: Recent Labs  Lab 06/22/20 0607 06/23/20 0658 06/24/20 0700 06/25/20 0602 06/27/20 0554  WBC 5.8 6.1 6.3 5.5 5.0  NEUTROABS 3.8 4.1 4.4 3.1  --   HGB 11.8* 12.7 11.6* 11.3* 11.1*  HCT 39.8 41.5 39.3 37.3 37.1  MCV 75.7* 72.9* 75.1* 74.2* 74.8*  PLT 97* 106* 108* 113* 497*   Basic Metabolic Panel: Recent Labs  Lab 06/22/20 0607 06/22/20 0607 06/23/20 0658 06/23/20 0658 06/24/20 0700 06/25/20 0602 06/26/20 0554 06/27/20 0554 06/28/20 0454  NA 143   < > 142   < > 142 140 142 141 141  K 3.8   < > 4.1   < > 3.9 3.9 3.6 3.7 3.8   CL 109   < > 107   < > 107 105 106 105 104  CO2 26   < > 24   < > 27 27 27 27 27   GLUCOSE 135*   < > 102*   < > 97 103* 99 106* 108*  BUN 43*   < > 47*   < > 51* 53* 52* 58* 64*  CREATININE 2.00*   < > 2.22*   < > 2.25* 2.11* 2.26* 2.33* 2.64*  CALCIUM 8.3*   < > 8.3*   < > 8.1* 8.4* 8.5* 8.4* 8.6*  MG 2.1  --  2.0  --  2.1 2.1  --   --   --   PHOS  --   --   --   --   --  4.1 4.0 3.8 4.3   < > = values in this interval not displayed.   GFR: Estimated Creatinine Clearance: 17.1 mL/min (A) (by C-G formula based on SCr of 2.64 mg/dL (  H)). Liver Function Tests: Recent Labs  Lab 06/24/20 0700 06/25/20 0602 06/26/20 0554 06/27/20 0554 06/28/20 0454  ALBUMIN 2.4* 3.1* 3.0* 2.8* 2.9*   No results for input(s): LIPASE, AMYLASE in the last 168 hours. No results for input(s): AMMONIA in the last 168 hours. Coagulation Profile: No results for input(s): INR, PROTIME in the last 168 hours. Cardiac Enzymes: No results for input(s): CKTOTAL, CKMB, CKMBINDEX, TROPONINI in the last 168 hours. BNP (last 3 results) No results for input(s): PROBNP in the last 8760 hours. HbA1C: No results for input(s): HGBA1C in the last 72 hours. CBG: No results for input(s): GLUCAP in the last 168 hours. Lipid Profile: No results for input(s): CHOL, HDL, LDLCALC, TRIG, CHOLHDL, LDLDIRECT in the last 72 hours. Thyroid Function Tests: No results for input(s): TSH, T4TOTAL, FREET4, T3FREE, THYROIDAB in the last 72 hours. Anemia Panel: No results for input(s): VITAMINB12, FOLATE, FERRITIN, TIBC, IRON, RETICCTPCT in the last 72 hours. Sepsis Labs: Recent Labs  Lab 06/22/20 0607  PROCALCITON <0.10    Recent Results (from the past 240 hour(s))  Culture, blood (routine x 2)     Status: Abnormal   Collection Time: 06/28/2020  3:40 AM   Specimen: BLOOD  Result Value Ref Range Status   Specimen Description   Final    BLOOD LEFT [facility] Performed at [facility], [address] [place], [place]  [address]    Special Requests   Final    BOTTLES DRAWN AEROBIC AND ANAEROBIC Blood Culture adequate volume Performed at [facility], [address] [place], [place] [address]    Culture  Setup Time   Final    GRAM POSITIVE COCCI AEROBIC BOTTLE ONLY CRITICAL VALUE NOTED.  VALUE IS CONSISTENT WITH PREVIOUSLY REPORTED AND CALLED VALUE. Performed at [facility], [address] [place], [place] [address]    Culture (A)  Final    STAPHYLOCOCCUS CAPITIS STAPHYLOCOCCUS EPIDERMIDIS    Report Status 06/23/2020 FINAL  Final   Organism ID, Bacteria STAPHYLOCOCCUS EPIDERMIDIS  Final      Susceptibility   Staphylococcus epidermidis - MIC*    CIPROFLOXACIN <=0.5 SENSITIVE Sensitive     ERYTHROMYCIN >=8 RESISTANT Resistant     GENTAMICIN <=0.5 SENSITIVE Sensitive     OXACILLIN <=0.25 SENSITIVE Sensitive     TETRACYCLINE <=1 SENSITIVE Sensitive     VANCOMYCIN 1 SENSITIVE Sensitive     TRIMETH/SULFA <=10 SENSITIVE Sensitive     CLINDAMYCIN <=0.25 SENSITIVE Sensitive     RIFAMPIN <=0.5 SENSITIVE Sensitive     Inducible Clindamycin NEGATIVE Sensitive     * STAPHYLOCOCCUS EPIDERMIDIS  Culture, blood (routine x 2)     Status: Abnormal   Collection Time: 06/03/2020  3:40 AM   Specimen: BLOOD  Result Value Ref Range Status   Specimen Description   Final    BLOOD RIGHT FA Performed at [facility], [address], [place], [place] [address]    Special Requests   Final    BOTTLES DRAWN AEROBIC AND ANAEROBIC Blood Culture adequate volume Performed at [facility], [address] [place], [place] [address]    Culture  Setup Time   Final    GRAM POSITIVE COCCI IN BOTH AEROBIC AND ANAEROBIC BOTTLES CRITICAL RESULT CALLED TO, READ BACK BY AND VERIFIED WITH: SUSAN WATSON AT 3235 07/01/2020.PMF    Culture (A)  Final    STAPHYLOCOCCUS HOMINIS STAPHYLOCOCCUS EPIDERMIDIS THE SIGNIFICANCE OF ISOLATING THIS ORGANISM FROM A SINGLE SET OF BLOOD CULTURES WHEN MULTIPLE  SETS ARE DRAWN IS UNCERTAIN. PLEASE NOTIFY THE MICROBIOLOGY DEPARTMENT WITHIN ONE WEEK IF SPECIATION AND SENSITIVITIES ARE REQUIRED. Performed at [facility], [place] [address] [place], [place] [address]    Report Status 06/23/2020 FINAL  Final  Blood Culture ID Panel (Reflexed)     Status: Abnormal   Collection Time: 06/27/2020  3:40 AM  Result Value Ref Range Status   Enterococcus faecalis NOT DETECTED NOT DETECTED Final   Enterococcus Faecium NOT DETECTED NOT DETECTED Final   Listeria monocytogenes NOT DETECTED NOT DETECTED Final   Staphylococcus species DETECTED (A) NOT DETECTED Final    Comment: CRITICAL RESULT CALLED TO, READ BACK BY AND VERIFIED WITH: SUSAN WATSON AT 1833 06/20/2020.PMF    Staphylococcus aureus (BCID) NOT DETECTED NOT DETECTED Final   Staphylococcus epidermidis DETECTED (A) NOT DETECTED Final    Comment: Methicillin (oxacillin) resistant coagulase negative staphylococcus. Possible blood culture contaminant (unless isolated from more than one blood culture draw or clinical case suggests pathogenicity). No antibiotic treatment is indicated for blood  culture contaminants. CRITICAL RESULT CALLED TO, READ BACK BY AND VERIFIED WITH: SUSAN WATSON AT 9381 06/28/2020.PMF    Staphylococcus lugdunensis NOT DETECTED NOT DETECTED Final   Streptococcus species NOT DETECTED NOT DETECTED Final   Streptococcus agalactiae NOT DETECTED NOT DETECTED Final   Streptococcus pneumoniae NOT DETECTED NOT DETECTED Final   Streptococcus pyogenes NOT DETECTED NOT DETECTED Final   A.calcoaceticus-baumannii NOT DETECTED NOT DETECTED Final   Bacteroides fragilis NOT DETECTED NOT DETECTED Final   Enterobacterales NOT DETECTED NOT DETECTED Final   Enterobacter cloacae complex NOT DETECTED NOT DETECTED Final   Escherichia coli NOT DETECTED NOT DETECTED Final   Klebsiella aerogenes NOT DETECTED NOT DETECTED Final   Klebsiella oxytoca NOT DETECTED NOT DETECTED Final   Klebsiella pneumoniae NOT  DETECTED NOT DETECTED Final   Proteus species NOT DETECTED NOT DETECTED Final   Salmonella species NOT DETECTED NOT DETECTED Final   Serratia marcescens NOT DETECTED NOT DETECTED Final   Haemophilus influenzae NOT DETECTED NOT DETECTED Final   Neisseria meningitidis NOT DETECTED NOT DETECTED Final   Pseudomonas aeruginosa NOT DETECTED NOT DETECTED Final   Stenotrophomonas maltophilia NOT DETECTED NOT DETECTED Final   Candida albicans NOT DETECTED NOT DETECTED Final   Candida auris NOT DETECTED NOT DETECTED Final   Candida glabrata NOT DETECTED NOT DETECTED Final   Candida krusei NOT DETECTED NOT DETECTED Final   Candida parapsilosis NOT DETECTED NOT DETECTED Final   Candida tropicalis NOT DETECTED NOT DETECTED Final   Cryptococcus neoformans/gattii NOT DETECTED NOT DETECTED Final   Methicillin resistance mecA/C DETECTED (A) NOT DETECTED Final    Comment: CRITICAL RESULT CALLED TO, READ BACK BY AND VERIFIED WITH: SUSAN WATSON AT 1833 07/01/2020.PMF Performed at [facility], [address] [place], [place] [address]   SARS Coronavirus 2 by RT PCR (hospital order, performed in [facility] hospital lab) Nasopharyngeal Nasopharyngeal Swab     Status: None   Collection Time: 06/29/2020 12:01 PM   Specimen: Nasopharyngeal Swab  Result Value Ref Range Status   SARS Coronavirus 2 NEGATIVE NEGATIVE Final    Comment: (NOTE) SARS-CoV-2 target nucleic acids are NOT DETECTED.  The SARS-CoV-2 RNA is generally detectable in upper and lower respiratory specimens during the acute phase of infection. The lowest concentration of SARS-CoV-2 viral copies this assay can detect is 250 copies / mL. A negative result does not preclude SARS-CoV-2 infection and should not be used as the sole basis for treatment or other patient  management decisions.  A negative result may occur with improper specimen collection / handling, submission of specimen other than nasopharyngeal swab, presence of viral  mutation(s) within the areas targeted by this assay, and inadequate number of viral copies (<250 copies / mL). A negative result must be combined with clinical observations, patient history, and epidemiological information.  Fact Sheet for Patients:   StrictlyIdeas.no  Fact Sheet for Healthcare Providers: BankingDealers.co.za  This test is not yet approved or  cleared by the [place] FDA and has been authorized for detection and/or diagnosis of SARS-CoV-2 by FDA under an Emergency Use Authorization (EUA).  This EUA will remain in effect (meaning this test can be used) for the duration of the COVID-19 declaration under Section 564(b)(1) of the Act, 21 U.S.C. section 360bbb-3(b)(1), unless the authorization is terminated or revoked sooner.  Performed at [facility], [address] [place], [place] [address]   CULTURE, BLOOD (ROUTINE X 2) w Reflex to ID Panel     Status: None   Collection Time: 06/20/20  5:34 PM   Specimen: BLOOD  Result Value Ref Range Status   Specimen Description BLOOD [facility]  Final   Special Requests   Final    BOTTLES DRAWN AEROBIC AND ANAEROBIC Blood Culture adequate volume   Culture   Final    NO GROWTH 5 DAYS Performed at [facility], [address] [place], [place] [address]    Report Status 06/25/2020 FINAL  Final  CULTURE, BLOOD (ROUTINE X 2) w Reflex to ID Panel     Status: None   Collection Time: 06/20/20  5:34 PM   Specimen: BLOOD  Result Value Ref Range Status   Specimen Description BLOOD RAC  Final   Special Requests   Final    BOTTLES DRAWN AEROBIC AND ANAEROBIC Blood Culture adequate volume   Culture   Final    NO GROWTH 5 DAYS Performed at [facility], [address], [place], [place] [address]    Report Status 06/25/2020 FINAL  Final     Radiology Studies: DG Chest 1 View  Result Date: 06/28/2020 CLINICAL DATA:  COVID-19 positivity with congestive  failure EXAM: CHEST  1 VIEW COMPARISON:  06/05/2020 FINDINGS: Cardiac shadow remains enlarged. Aortic calcifications are again seen. Mild vascular congestion is noted although improved from the prior exam. Interval clearing of right-sided effusion and basilar atelectasis is noted. Persistent left basilar opacity is noted and stable. IMPRESSION: Improved aeration particularly in the right lung base. Persistent opacity in the left base is seen. Electronically Signed   By: Inez Catalina M.D.   On: 06/28/2020 14:47    Scheduled Meds: . allopurinol  50 mg Oral Daily  . atorvastatin  20 mg Oral Daily  . ferrous sulfate  325 mg Oral Q breakfast  . furosemide  80 mg Intravenous Q12H  . gabapentin  300 mg Oral TID  . metoprolol succinate  75 mg Oral Daily  . polyethylene glycol  17 g Oral Daily  . Rivaroxaban  15 mg Oral Q supper  . senna-docusate  1 tablet Oral BID  . sodium chloride flush  3 mL Intravenous Q12H   Continuous Infusions: . sodium chloride       LOS: 9 days   Time spent: 30 minutes.  Lorella Nimrod, MD [facility]  If 7PM-7AM, please contact night-coverage [email or web address]  06/28/2020, 3:24 PM   This record has been created using Systems analyst. Errors have been sought and corrected,but may not  always be located. Such creation errors do not reflect on the standard of care.

## 2020-06-28 NOTE — Progress Notes (Signed)
Mobility Specialist - Progress Note   06/28/20 1223  Mobility  Activity Refused mobility  Mobility performed by Mobility specialist    Pt sleeping in bed upon arrival. Pt awakes after multiple attempts. Pt initially agreed to session. However, pt could not keep her eyes open while mobility specialist was speaking to her. Pt, then, refused session at this time. Nurse was notified. Will re-attempt at a later date/time.    Kristy Solomon Mobility Specialist  06/28/20, 12:24 PM

## 2020-06-28 NOTE — Progress Notes (Addendum)
Progress Note  Patient Name: Kristy Solomon Date of Encounter: 06/28/2020  Primary Cardiologist: Kate Sable, MD  Subjective   Patient sleepy  Breathing fair  No CP   Inpatient Medications    Scheduled Meds: . allopurinol  50 mg Oral Daily  . atorvastatin  20 mg Oral Daily  . ferrous sulfate  325 mg Oral Q breakfast  . furosemide  80 mg Intravenous Q12H  . gabapentin  300 mg Oral TID  . metoprolol succinate  75 mg Oral Daily  . polyethylene glycol  17 g Oral Daily  . Rivaroxaban  15 mg Oral Q supper  . senna-docusate  1 tablet Oral BID  . sodium chloride flush  3 mL Intravenous Q12H   Continuous Infusions: . sodium chloride     PRN Meds: sodium chloride, acetaminophen, ipratropium-albuterol, ondansetron (ZOFRAN) IV, sodium chloride flush   Vital Signs    Vitals:   06/27/20 2058 06/28/20 0543 06/28/20 0756 06/28/20 0835  BP: 122/75 117/74 120/70   Pulse: 88 93 87   Resp: 18 20 20    Temp: 97.8 F (36.6 C) 98.8 F (37.1 C) 98.2 F (36.8 C)   TempSrc: Oral Oral Oral   SpO2: 100% 100% 99%   Weight:    101.7 kg  Height:        Intake/Output Summary (Last 24 hours) at 06/28/2020 0931 Last data filed at 06/28/2020 0636 Gross per 24 hour  Intake 720 ml  Output 850 ml  Net -130 ml   Net neg 5.L   Filed Weights   06/26/20 0520 06/27/20 0317 06/28/20 0835  Weight: 99.7 kg 101.2 kg 101.7 kg    Physical Exam   GEN: obese, in no acute distress.  HEENT: Grossly normal.  Neck: Supple, JVP is increased   Cardiac: IR, IR, distant, no murmurs. 1-2+ bilat LEE w/ post thigh, hip, and abd flank edema.  Respiratory:  CTA GI: Soft, nontender  Obese  + edema Mild MS: no deformity or atrophy. Skin: warm and dry, no rash.   Labs    Chemistry Recent Labs  Lab 06/26/20 0554 06/27/20 0554 06/28/20 0454  NA 142 141 141  K 3.6 3.7 3.8  CL 106 105 104  CO2 27 27 27   GLUCOSE 99 106* 108*  BUN 52* 58* 64*  CREATININE 2.26* 2.33* 2.64*  CALCIUM 8.5* 8.4*  8.6*  ALBUMIN 3.0* 2.8* 2.9*  GFRNONAA 19* 18* 15*  GFRAA 22* 21* 18*  ANIONGAP 9 9 10      Hematology Recent Labs  Lab 06/24/20 0700 06/25/20 0602 06/27/20 0554  WBC 6.3 5.5 5.0  RBC 5.23* 5.03 4.96  HGB 11.6* 11.3* 11.1*  HCT 39.3 37.3 37.1  MCV 75.1* 74.2* 74.8*  MCH 22.2* 22.5* 22.4*  MCHC 29.5* 30.3 29.9*  RDW 19.6* 19.6* 19.9*  PLT 108* 113* 134*    Cardiac Enzymes  Recent Labs  Lab 06/08/2020 0117 06/03/2020 0340  TROPONINIHS 86* 83*       Lipids  Lab Results  Component Value Date   CHOL 99 01/01/2020   HDL 47 01/01/2020   LDLCALC 42 01/01/2020   TRIG 52 01/01/2020   CHOLHDL 2.1 01/01/2020    HbA1c  Lab Results  Component Value Date   HGBA1C 5.8 (H) 01/01/2020    Radiology    No results found.  Telemetry    AFib 80's to 90's - Personally Reviewed  Cardiac Studies   Echo 12/2019 1. Left ventricular ejection fraction, by estimation, is 50 to  55%. The  left ventricle has low normal function. The left ventricle has no regional  wall motion abnormalities. There is mild left ventricular hypertrophy.  Left ventricular diastolic  parameters are indeterminate.  2. Right ventricular systolic function is normal. The right ventricular  size is normal. Tricuspid regurgitation signal is inadequate for assessing  PA pressure.  3. Left atrial size was mildly dilated.  4. Right atrial size was mildly dilated.  5. The mitral valve is abnormal. Mild mitral valve regurgitation. No  evidence of mitral stenosis.  6. Tricuspid valve regurgitation is mild to moderate.  7. The aortic valve is tricuspid. Aortic valve regurgitation is not  visualized. No aortic stenosis is present.  8. The inferior vena cava is dilated in size with <50% respiratory  variability, suggesting right atrial pressure of 15 mmHg.  Patient Profile     86 y.o.femalewith history of permanent atrial fibrillation, HFpEF, CKD stage IV, hypertension, lymphoma, and anemia of chronic  disease who presented due to shortness of breath and volume overload in the setting of medication noncompliance.  Assessment & Plan    1. Acute on chonic HF w/ preserved EF: EF 50-55% by echo 3.2021.  Presented 9/17 w/ massive volume overload in the setting of noncompliance w/ home meds including torsemide and metoprolol.  Still with signif volume overload   Renal service is following   On IV lasix   2. Permanent Afib:  Rates remain controlled  Continue Eliquis at current dose  3.  Acute on chronic Stage IV kidney dzs/cardiorenal syndrome: CR ash bumped again  Renal service is following.  4.  Hypotension: BP is better today on current regimen    5.  Hypoalbuminemia:  Has received Rx with   WIll need to follow   6. Elevated HsTroponin  Trivial elevation/ flat  Demand ischemia:   in setting of massive volume overload.  She did report occas chest pressure @ home.  No prior ischemic eval but cor Ca2+ noted on prior CT.  With advanced age and CKD IV, she is a poor candidate for cath, Pt denies CP   7.  Microcytic anemia:  Hgb stable yesterday    Signed, Dorris Carnes, MD  06/28/2020, 9:31 AM     For questions or updates, please contact   Please consult [email or web address] for contact info under Cardiology/STEMI.

## 2020-06-29 LAB — RENAL FUNCTION PANEL
Albumin: 2.7 g/dL — ABNORMAL LOW (ref 3.5–5.0)
Anion gap: 9 (ref 5–15)
BUN: 68 mg/dL — ABNORMAL HIGH (ref 8–23)
CO2: 27 mmol/L (ref 22–32)
Calcium: 8.7 mg/dL — ABNORMAL LOW (ref 8.9–10.3)
Chloride: 105 mmol/L (ref 98–111)
Creatinine, Ser: 3.3 mg/dL — ABNORMAL HIGH (ref 0.44–1.00)
GFR calc Af Amer: 14 mL/min — ABNORMAL LOW (ref >60)
GFR calc non Af Amer: 12 mL/min — ABNORMAL LOW (ref >60)
Glucose, Bld: 102 mg/dL — ABNORMAL HIGH (ref 70–99)
Phosphorus: 4.4 mg/dL (ref 2.5–4.6)
Potassium: 4.6 mmol/L (ref 3.5–5.1)
Sodium: 141 mmol/L (ref 135–145)

## 2020-06-29 LAB — CBC
HCT: 39.4 % (ref 36.0–46.0)
Hemoglobin: 11.3 g/dL — ABNORMAL LOW (ref 12.0–15.0)
MCH: 22.2 pg — ABNORMAL LOW (ref 26.0–34.0)
MCHC: 28.7 g/dL — ABNORMAL LOW (ref 30.0–36.0)
MCV: 77.6 fL — ABNORMAL LOW (ref 80.0–100.0)
Platelets: 155 10*3/uL (ref 150–400)
RBC: 5.08 MIL/uL (ref 3.87–5.11)
RDW: 19.8 % — ABNORMAL HIGH (ref 11.5–15.5)
WBC: 5.8 10*3/uL (ref 4.0–10.5)
nRBC: 0 % (ref 0.0–0.2)

## 2020-06-29 LAB — PROTIME-INR
INR: 1.4 — ABNORMAL HIGH (ref 0.8–1.2)
Prothrombin Time: 16.6 seconds — ABNORMAL HIGH (ref 11.4–15.2)

## 2020-06-29 LAB — APTT: aPTT: 37 seconds — ABNORMAL HIGH (ref 24–36)

## 2020-06-29 LAB — HEPARIN LEVEL (UNFRACTIONATED): Heparin Unfractionated: 1.57 IU/mL — ABNORMAL HIGH (ref 0.30–0.70)

## 2020-06-29 MED ORDER — ALBUMIN HUMAN 25 % IV SOLN
12.5000 g | Freq: Every day | INTRAVENOUS | Status: DC
  Administered 2020-06-29 – 2020-07-08 (×10): 12.5 g via INTRAVENOUS
  Filled 2020-06-29 (×10): qty 50

## 2020-06-29 MED ORDER — HEPARIN (PORCINE) 25000 UT/250ML-% IV SOLN
1050.0000 [IU]/h | INTRAVENOUS | Status: DC
  Administered 2020-06-29: 1100 [IU]/h via INTRAVENOUS
  Administered 2020-06-30 – 2020-07-03 (×3): 900 [IU]/h via INTRAVENOUS
  Filled 2020-06-29 (×4): qty 250

## 2020-06-29 MED ORDER — MIDODRINE HCL 5 MG PO TABS
5.0000 mg | ORAL_TABLET | Freq: Three times a day (TID) | ORAL | Status: DC
  Administered 2020-06-29 – 2020-07-01 (×6): 5 mg via ORAL
  Filled 2020-06-29 (×6): qty 1

## 2020-06-29 MED ORDER — FUROSEMIDE 10 MG/ML IJ SOLN
8.0000 mg/h | INTRAVENOUS | Status: DC
  Administered 2020-06-29: 4 mg/h via INTRAVENOUS
  Administered 2020-07-01: 8 mg/h via INTRAVENOUS
  Filled 2020-06-29 (×2): qty 25

## 2020-06-29 NOTE — Progress Notes (Signed)
Mobility Specialist - Progress Note   06/29/20 1200  Mobility  Activity Ambulated in room  Level of Assistance Contact guard assist, steadying assist  Assistive Device Front wheel walker  Distance Ambulated (ft) 40 ft  Mobility Response Tolerated well  Mobility performed by Mobility specialist  $Mobility charge 1 Mobility    Pre-mobility: 95 HR, 107/83 BP, 100% SpO2 During mobility: 108 HR, 95% SpO2 Post-mobility: 92 HR, 114/76 BP, 99% SpO2   Pt was sleeping in recliner utilizing 2L [place] O2 upon arrival. Pt was easily awakened and agreed to session. Pt denied any pain, dizziness, nausea, or fatigue. Pt was SBA in sit-to-stand transfer, CGA used for safety precautions. Mobility place O2 tank on 1L for ambulation attempt. Pt was able to ambulate 20' in room before needing a seated break. Pt c/o slight SOB, O2 desat to 95%. Pt's O2 maintained > low-mid 90s throughout session while on 1L. After 2 min rest break, pt attempted ambulating another 20', this time pt denied SOB. Pt was placed back on 2L at the end of session. Overall, pt tolerated session well. Pt was left in recliner with all needs in reach and alarm set. Nurse was notified of performance.     Kristy Solomon Mobility Specialist 06/29/20, 12:13 PM

## 2020-06-29 NOTE — Progress Notes (Signed)
[facility], [place] 06/29/20  Subjective:   Hospital day # 10   Renal: 09/26 0701 - 09/27 0700 In: 240 [P.O.:240] Out: 400 [Urine:400] Lab Results  Component Value Date   CREATININE 3.30 (H) 06/29/2020   CREATININE 2.64 (H) 06/28/2020   CREATININE 2.33 (H) 06/27/2020  Sitting up in the chair today.  Denies any acute complaints Continues to have large amount of lower extremity edema States she was able to eat pancakes and sausage for breakfast   Objective:  Vital signs in last 24 hours:  Temp:  [97.6 F (36.4 C)-99 F (37.2 C)] 99 F (37.2 C) (09/27 0830) Pulse Rate:  [71-104] 88 (09/27 0830) Resp:  [16-19] 16 (09/27 0830) BP: (90-115)/(53-96) 107/73 (09/27 0830) SpO2:  [93 %-100 %] 99 % (09/27 0830) Weight:  [102 kg] 102 kg (09/27 0537)  Weight change:  Filed Weights   06/27/20 0317 06/28/20 0835 06/29/20 0537  Weight: 101.2 kg 101.7 kg 102 kg    Intake/Output:    Intake/Output Summary (Last 24 hours) at 06/29/2020 1057 Last data filed at 06/29/2020 1025 Gross per 24 hour  Intake 360 ml  Output 400 ml  Net -40 ml     Physical Exam: General:  Obese, elderly, frail, sitting up in recliner chair  HEENT  moist oral mucous membranes  Pulm/lungs  decreased breath sounds at bases  CVS/Heart  irregular  Abdomen:   Soft, obese  Extremities:  2-3+ pitting edema  Neurologic:  Alert, able to answer simple questions  Skin:  No acute rashes          Basic Metabolic Panel:  Recent Labs  Lab 06/23/20 0658 06/23/20 0658 06/24/20 0700 06/24/20 0700 06/25/20 0602 06/25/20 0602 06/26/20 0554 06/26/20 0554 06/27/20 0554 06/28/20 0454 06/29/20 0455  NA 142   < > 142   < > 140  --  142  --  141 141 141  K 4.1   < > 3.9   < > 3.9  --  3.6  --  3.7 3.8 4.6  CL 107   < > 107   < > 105  --  106  --  105 104 105  CO2 24   < > 27   < > 27  --  27  --  27 27 27   GLUCOSE 102*   < > 97   < > 103*  --  99  --  106* 108* 102*  BUN 47*   < >  51*   < > 53*  --  52*  --  58* 64* 68*  CREATININE 2.22*   < > 2.25*   < > 2.11*  --  2.26*  --  2.33* 2.64* 3.30*  CALCIUM 8.3*   < > 8.1*   < > 8.4*   < > 8.5*   < > 8.4* 8.6* 8.7*  MG 2.0  --  2.1  --  2.1  --   --   --   --   --   --   PHOS  --   --   --   --  4.1  --  4.0  --  3.8 4.3 4.4   < > = values in this interval not displayed.     CBC: Recent Labs  Lab 06/23/20 0658 06/24/20 0700 06/25/20 0602 06/27/20 0554  WBC 6.1 6.3 5.5 5.0  NEUTROABS 4.1 4.4 3.1  --   HGB 12.7 11.6* 11.3* 11.1*  HCT 41.5 39.3 37.3  37.1  MCV 72.9* 75.1* 74.2* 74.8*  PLT 106* 108* 113* 134*      Lab Results  Component Value Date   HEPBSAG Negative 09/11/2018      Microbiology:  Recent Results (from the past 240 hour(s))  SARS Coronavirus 2 by RT PCR (hospital order, performed in [facility] hospital lab) Nasopharyngeal Nasopharyngeal Swab     Status: None   Collection Time: 06/17/2020 12:01 PM   Specimen: Nasopharyngeal Swab  Result Value Ref Range Status   SARS Coronavirus 2 NEGATIVE NEGATIVE Final    Comment: (NOTE) SARS-CoV-2 target nucleic acids are NOT DETECTED.  The SARS-CoV-2 RNA is generally detectable in upper and lower respiratory specimens during the acute phase of infection. The lowest concentration of SARS-CoV-2 viral copies this assay can detect is 250 copies / mL. A negative result does not preclude SARS-CoV-2 infection and should not be used as the sole basis for treatment or other patient management decisions.  A negative result may occur with improper specimen collection / handling, submission of specimen other than nasopharyngeal swab, presence of viral mutation(s) within the areas targeted by this assay, and inadequate number of viral copies (<250 copies / mL). A negative result must be combined with clinical observations, patient history, and epidemiological information.  Fact Sheet for Patients:   StrictlyIdeas.no  Fact Sheet for  Healthcare Providers: BankingDealers.co.za  This test is not yet approved or  cleared by the [place] FDA and has been authorized for detection and/or diagnosis of SARS-CoV-2 by FDA under an Emergency Use Authorization (EUA).  This EUA will remain in effect (meaning this test can be used) for the duration of the COVID-19 declaration under Section 564(b)(1) of the Act, 21 U.S.C. section 360bbb-3(b)(1), unless the authorization is terminated or revoked sooner.  Performed at [facility], [address] [place], [place] [address]   CULTURE, BLOOD (ROUTINE X 2) w Reflex to ID Panel     Status: None   Collection Time: 06/20/20  5:34 PM   Specimen: BLOOD  Result Value Ref Range Status   Specimen Description BLOOD [facility]  Final   Special Requests   Final    BOTTLES DRAWN AEROBIC AND ANAEROBIC Blood Culture adequate volume   Culture   Final    NO GROWTH 5 DAYS Performed at [facility], [address] [place], [place] [address]    Report Status 06/25/2020 FINAL  Final  CULTURE, BLOOD (ROUTINE X 2) w Reflex to ID Panel     Status: None   Collection Time: 06/20/20  5:34 PM   Specimen: BLOOD  Result Value Ref Range Status   Specimen Description BLOOD RAC  Final   Special Requests   Final    BOTTLES DRAWN AEROBIC AND ANAEROBIC Blood Culture adequate volume   Culture   Final    NO GROWTH 5 DAYS Performed at [facility], [address] [place], [place] [address]    Report Status 06/25/2020 FINAL  Final    Coagulation Studies: No results for input(s): LABPROT, INR in the last 72 hours.  Urinalysis: No results for input(s): COLORURINE, LABSPEC, PHURINE, GLUCOSEU, HGBUR, BILIRUBINUR, KETONESUR, PROTEINUR, UROBILINOGEN, NITRITE, LEUKOCYTESUR in the last 72 hours.  Invalid input(s): APPERANCEUR    Imaging: DG Chest 1 View  Result Date: 06/28/2020 CLINICAL DATA:  COVID-19 positivity with congestive failure EXAM: CHEST  1 VIEW  COMPARISON:  06/27/2020 FINDINGS: Cardiac shadow remains enlarged. Aortic calcifications are again seen. Mild vascular congestion is noted although improved from  the prior exam. Interval clearing of right-sided effusion and basilar atelectasis is noted. Persistent left basilar opacity is noted and stable. IMPRESSION: Improved aeration particularly in the right lung base. Persistent opacity in the left base is seen. Electronically Signed   By: Inez Catalina M.D.   On: 06/28/2020 14:47     Medications:   . sodium chloride     . allopurinol  50 mg Oral Daily  . atorvastatin  20 mg Oral Daily  . ferrous sulfate  325 mg Oral Q breakfast  . furosemide  80 mg Intravenous Q12H  . gabapentin  300 mg Oral TID  . metoprolol succinate  75 mg Oral Daily  . polyethylene glycol  17 g Oral Daily  . Rivaroxaban  15 mg Oral Q supper  . senna-docusate  1 tablet Oral BID  . sodium chloride flush  3 mL Intravenous Q12H   sodium chloride, acetaminophen, ipratropium-albuterol, ondansetron (ZOFRAN) IV, sodium chloride flush  Assessment/ Plan:  86 y.o. female with congestive heart failure, hypertension, atrial fibrillation, hyperlipidemia, history of lymphoma admitted on 06/24/2020 for Hypoxia [R09.02] Elevated TSH [R79.89] Acute CHF (congestive heart failure) (HCC) [I50.9] Acute on chronic congestive heart failure, unspecified heart failure type (HCC) [I50.9]   #Acute kidney injury with volume overload #Chronic kidney disease stage IV.  Baseline creatinine 1.75 from May 08, 2020.  GFR 26-30.  Underlying CKD likely secondary to atherosclerosis, advanced age and hypertension AKI likely secondary to cardiorenal syndrome Urine output has decreased Patient is currently getting IV furosemide 80 mg twice a day.  Blood pressures have been soft Echo results from December 31, 2019 show EF 50 to 50%, normal right ventricular function  serum creatinine trend is worsening  Plan to change furosemide to IV infusion Add  IV albumin for oncotic support Add midodrine to avoid hypotension Recommend palliative care discussion with patient and family regarding goals of care.     LOS: [place] 9/27/202110:57 AM  [place], [place]  Note: This note was prepared with Dragon dictation. Any transcription errors are unintentional

## 2020-06-29 NOTE — Care Management Important Message (Signed)
Important Message  Patient Details  Name: Kristy Solomon MRN: [id number] Date of Birth: 1929-02-15   Medicare Important Message Given:  Yes     [name] 06/29/2020, 12:59 PM

## 2020-06-29 NOTE — Consult Note (Signed)
ANTICOAGULATION CONSULT NOTE - Initial Consult  Pharmacy Consult for Heparin infusion Indication: atrial fibrillation  Allergies  Allergen Reactions  . Eliquis [Apixaban]     abd pain    Patient Measurements: Height: 5\' 5"  (165.1 cm) Weight: 102 kg (224 lb 14.4 oz) IBW/kg (Calculated) : 57 Heparin Dosing Weight: 80.5 kg  Vital Signs: Temp: 98.5 F (36.9 C) (09/27 1304) Temp Source: Oral (09/27 1304) BP: 104/55 (09/27 1500) Pulse Rate: 87 (09/27 1500)  Labs: Recent Labs    06/27/20 0554 06/28/20 0454 06/29/20 0455  HGB 11.1*  --  11.3*  HCT 37.1  --  39.4  PLT 134*  --  155  CREATININE 2.33* 2.64* 3.30*    Estimated Creatinine Clearance: 13.7 mL/min (A) (by C-G formula based on SCr of 3.3 mg/dL (H)).   Medical History: Past Medical History:  Diagnosis Date  . (HFpEF) heart failure with preserved ejection fraction ([place])    a. 12/2019 Echo: EF 50-55%, no rwma, mild LVH. Nl RV fxn. Mildly BAE. Mild MR, mild-mod TR.  Marland Kitchen Anemia in neoplastic disease 06/27/2014  . Aortic atherosclerosis ([place])    a. Seen on CT 05/2020.  Marland Kitchen CKD (chronic kidney disease), stage III-IV   . Coronary artery calcification seen on CT scan 05/2020  . COVID-19 virus infection 01/2020  . Hyperlipidemia   . Hypertension   . 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 Permanent atrial fibrillation (HCC)    a. CHA2DS2VASc = 6-->eliquis.    Medications:  Rivaroxaban 15 mg daily  Assessment: 86 yo female admitted with acute on chronic HFpEF with PMH significant for chronic diastolic CHF, CKD stage IV, HTN, and Afib for which the patient takes rivaroxaban. Patient did not tolerate Eliquis in the past (stomach discomfort). Pharmacy has been consulted for switching patient from rivaroxaban to IV heparin in light of CrCl 13.51mL/min. Last dose of rivaroxaban was 9/26 at 1630.  Baseline aPTT, PT/INR and anti-Xa levels pending. Baseline Hgb ~11; Plt 86 on 9/19 and have trended up to 155.    Goal of Therapy:  Heparin level 0.3-0.7 units/ml once aPTT and anti-Xa levels being to correlate aPTT 66-102 seconds Monitor platelets by anticoagulation protocol: Yes   Plan:  Will not bolus as patient was already anticoagulated with Xarelto from 9/17-9/26. Start heparin infusion at 1100 units/hr Check aPTT in 8 hours and daily while on heparin Continue to monitor H&H and platelets  Freddie Nghiem O Abdulkareem Badolato 06/29/2020,3:42 PM

## 2020-06-29 NOTE — Progress Notes (Signed)
Physical Therapy Treatment Patient Details Name: Kristy Solomon MRN: [id number] DOB: January 24, 1929 Today's Date: 06/29/2020    History of Present Illness Kristy Solomon is an 86 y/o female who was admitted for worsening SOB with an increase in O2 to 3L due to symptoms. In ED, pt was tachycardic and in rapid A-fib w/ HR to 170. PMH includes chronic respiratory failure on 2L O2 continuous, chrinic diastolic dysfunction, A-fib on chronic anticoagulation therapy, HTN with complications of CKD III-IV, covid-19 virus infection April 2021, and history of malignant lymphoma.    PT Comments    Pt received in bed asleep however awoke to verbal stimuli. Pt noted to have tremors/muscle jerks while sitting in recliner chair primarily noted on RUE and RLE. Pt reports that this has happened for a while however this is new to clinician, who has worked with this patient multiple times this admission. Attempted to stand pt with mod A however RLE began to buckle and pt returned to sitting with mod A for eccentric control on descent. Pt then reported need to have a BM. Pt transferred to [facility] with min-mod+2 A for boost into standing, balance, and eccentric control on descent. R side required heavier assistance secondary to new weakness noted this session. Pt stood and required total A for pericare and required seated rest before returning to bed. Pt ambulated 3 feet using RW with min+2 A for steadying and safety. Verbal cues required throughout session for sequencing of tasks and for hand placement during transfers. Once seated edge of bed, pt required max+2 A for returning to supine for trunk and BLE management. Recommending STR at this point secondary to pt's increased level of assistance required for all mobility today. Pt remained on 2L O2 throughout session. Nursing notified of pt's level of performance, newly observed muscle jerks, and BP throughout session.  BP during session: Seated in recliner chair prior to  activity: 68/54 Sitting on BSC: 80/69 Sitting in recliner chair after returned from [facility]: 74/48 Semi-supine in bed: 64/51    Follow Up Recommendations  SNF;Supervision for mobility/OOB     Equipment Recommendations  None recommended by PT    Recommendations for Other Services       Precautions / Restrictions Precautions Precautions: Fall Restrictions Weight Bearing Restrictions: No    Mobility  Bed Mobility Overal bed mobility: Needs Assistance Bed Mobility: Supine to Sit     Supine to sit: Max assist;+2 for physical assistance     General bed mobility comments: Max +2 A for trunk control on descent and BLE elevation onto bed. Max+2 A for scooting to [facility] for improved positioning.  Transfers Overall transfer level: Needs assistance Equipment used: Rolling walker (2 wheeled) Transfers: Sit to/from Stand Sit to Stand: Min assist;Mod assist;+2 physical assistance         General transfer comment: Min-mod+2 A for sit <> stand transfer from recliner chair and BSC for boost into standing, balance while upright, and eccentric control on descent. Verbal cues for hand placement. Foot blocking required  Ambulation/Gait Ambulation/Gait assistance: Min assist;+2 physical assistance Gait Distance (Feet): 3 Feet Assistive device: Rolling walker (2 wheeled)   Gait velocity: decreased   General Gait Details: Pt ambulated 3 feet with RW and min+2 A for balance. Verbal cues for sequencing of tasks. RLE noted to be weaker today and required heavier assistance on R side for balance.   Stairs             Wheelchair Mobility    Modified Rankin (  Stroke Patients Only)       Balance Overall balance assessment: Needs assistance Sitting-balance support: Feet supported;Bilateral upper extremity supported Sitting balance-Leahy Scale: Fair Sitting balance - Comments: fair balance noted today with SBA required for safety   Standing balance support: Bilateral upper extremity  supported Standing balance-Leahy Scale: Poor Standing balance comment: poor balance in standing requiring +2 assistance for safety                            Cognition Arousal/Alertness: Awake/alert Behavior During Therapy: WFL for tasks assessed/performed Overall Cognitive Status: Within Functional Limits for tasks assessed                                 General Comments: pt keeps eyes closed for most of session howeer repsonds immediately and is awake      Exercises Other Exercises Other Exercises: additional time taken when pt seated on BSC and stood for pericare Other Exercises: RLE LAQ x 5 with decreased motor control noted    General Comments        Pertinent Vitals/Pain Pain Assessment: No/denies pain    Home Living                      Prior Function            PT Goals (current goals can now be found in the care plan section) Acute Rehab PT Goals Patient Stated Goal: to get back to bed PT Goal Formulation: With patient Time For Goal Achievement: 07/13/2020 Potential to Achieve Goals: Good Progress towards PT goals: Progressing toward goals    Frequency    Min 2X/week      PT Plan Current plan remains appropriate    Co-evaluation              AM-PAC PT "6 Clicks" Mobility   Outcome Measure  Help needed turning from your back to your side while in a flat bed without using bedrails?: A Lot Help needed moving from lying on your back to sitting on the side of a flat bed without using bedrails?: A Lot Help needed moving to and from a bed to a chair (including a wheelchair)?: A Little Help needed standing up from a chair using your arms (e.g., wheelchair or bedside chair)?: A Little Help needed to walk in hospital room?: A Lot Help needed climbing 3-5 steps with a railing? : A Lot 6 Click Score: 14    End of Session Equipment Utilized During Treatment: Oxygen;Gait belt (2L) Activity Tolerance: Patient limited by  lethargy Patient left: in bed;with call bell/phone within reach;with bed alarm set Nurse Communication: Mobility status;Other (comment) (BP during session) PT Visit Diagnosis: Unsteadiness on feet (R26.81);Other abnormalities of gait and mobility (R26.89);Muscle weakness (generalized) (M62.81)     Time: 2297-9892 PT Time Calculation (min) (ACUTE ONLY): 43 min  Charges:  $Gait Training: 8-22 mins $Therapeutic Exercise: 8-22 mins $Therapeutic Activity: 8-22 mins                     Vale Haven, SPT   Jalisha Enneking 06/29/2020, 4:50 PM

## 2020-06-29 NOTE — Progress Notes (Addendum)
PROGRESS NOTE    Kristy Solomon  LPF:[id number] DOB: 1928/12/20 DOA: 06/11/2020 PCP: Jodi Marble, MD   Brief Narrative:  Kristy Solomon is an 86 yo AA female with PMH chronic respiratory failure (on 2L [place] O2), HFpEF, permanent afib (on Xarelto), HTN, CKDIV HLD, hx lymphoma, anemia of chronic disease who presented to the hospital with worsening SOB. She had been out of her medications at home for approx a couple weeks.  She was found to be volume overloaded and in afib with RVR. Nephrology and cardiology were consulted on admission.  A CXR was obtained which revealed bilateral pleural effusions and pulmonary edema.  She was started on IV Lasix and resumed back on her beta-blocker. Patient was not responding well to diuresis, becoming more oliguric. Started on Lasix infusion along with midodrine to see if she responds. Not a candidate for HD. Palliative care was consulted.  Subjective: Patient appears lethargic when seen today.  Denies any complaint.  Stating that she just want to get some sleep.  Assessment & Plan:   Principal Problem:   Acute on chronic heart failure with preserved ejection fraction (HFpEF) (HCC) Active Problems:   Waldenstrom macroglobulinemia (HCC)   Thrombocytopenia (HCC)   HTN (hypertension)   GERD (gastroesophageal reflux disease)   CKD (chronic kidney disease), stage IV (HCC)   Atrial fibrillation with rapid ventricular response (HCC)   Non compliance w medication regimen   Permanent atrial fibrillation (HCC)  Acute on chronic heart failure with preserved ejection fraction (HFpEF) ([place]).  Cardiology is following-appreciate their recommendation. Patient has a net negative of 5L.  Weight seems increasing. Initially treated with IV Lasix and then it was held for couple of days due to worsening renal function.  Home dose of torsemide was restarted by cardiology, IV Lasix at an higher dose was resumed due to poor urinary output. Patient remained oliguric,  just made 400 cc over the past 24-hour with IV Lasix of 80 mg twice Solomon.  Softer blood pressure.  He is not a candidate for HD due to poor functional status. -She was started on Lasix infusion along with midodrine today. -Metoprolol dose was decreased to 75 by cardiology. -Continue with strict intake and output and Solomon weight. -PT is recommending home health services-ordered. -Palliative care was consulted to discuss goals of care.  Hypotension.  Intermittently becoming hypotensive.  There was a huge difference between blood pressure measured on right and left upper extremities.  Right systolic mostly in the 53G with left systolic in 99M and 42A.  No prior diagnosis of coarctation or subclavian stenosis. -Restarted on midodrine 5 mg 3 times a day.  Atrial fibrillation with rapid ventricular response ([place]).  Most likely secondary to being out of medications at home.  Currently rate well controlled. -Continue home dose of Toprol -Xarelto was discontinued due to worsening renal function and she was started on heparin infusion.  GERD (gastroesophageal reflux disease) - continue PPI  HTN (hypertension).  Blood pressure within goal today. -We will need to make sure that patient can afford meds at discharge.  AKI with CKD stage IV.  Most likely secondary to cardiorenal syndrome.  Baseline creatinine around 1.7 per nephrology. Nephrology is following-appreciate their recommendations. Worsening creatinine and she is becoming oliguric.  400 cc of UOP with Lasix 80 mg twice Solomon.  Not a candidate for HD.  Remains volume overload. -She was started on Lasix infusion along with albumin and midodrine today. -Continue to monitor. -Avoid nephrotoxins  Thrombocytopenia ([place]).  Seems stable and improving. - patient has mild chronic thrombocytopenia at baseline ~120-140k - etiology due to her underlying Waldenstrom's macroglobulinemia and effect from ibrutinib; follows with oncology - okay to  continue xarelto for now   Waldenstrom macroglobulinemia [facility]) - follows with oncology; last OV 04/23/20 - continue ibrutinib   Objective: Vitals:   06/29/20 1430 06/29/20 1437 06/29/20 1457 06/29/20 1500  BP: (!) 74/48 (!) 64/51 97/65 (!) 104/55  Pulse: 93 (!) 57 79 87  Resp:    20  Temp:      TempSrc:      SpO2:      Weight:      Height:        Intake/Output Summary (Last 24 hours) at 06/29/2020 1625 Last data filed at 06/29/2020 1340 Gross per 24 hour  Intake 720 ml  Output 825 ml  Net -105 ml   Filed Weights   06/27/20 0317 06/28/20 0835 06/29/20 0537  Weight: 101.2 kg 101.7 kg 102 kg    Examination:  General.  Chronically ill-appearing elderly lady, in no acute distress. Pulmonary.  Few basal crackles, normal respiratory effort. CV.  Irregular with normal rate Abdomen.  Soft, nontender, nondistended, BS positive. CNS.  Alert and oriented .  No focal neurologic deficit. Extremities. 2+ LE edema, no cyanosis, pulses intact and symmetrical. Psychiatry.  Judgment and insight appears normal.  DVT prophylaxis: Xarelto Code Status: Full Family Communication:  Daughter was updated on phone. Disposition Plan:  Status is: Inpatient  Remains inpatient appropriate because:Inpatient level of care appropriate due to severity of illness   Dispo: The patient is from: Home              Anticipated d/c is to: Home              Anticipated d/c date is: 2-3 days.              Patient currently is not medically stable to d/c.  Patient is high risk for readmission, deterioration and death.  Remains volume overload.  History of being noncompliant.  Palliative care was consulted.  Consultants:   Cardiology  Nephrology  Palliative care  Procedures:  Antimicrobials:   Data Reviewed: I have personally reviewed following labs and imaging studies  CBC: Recent Labs  Lab 06/23/20 0658 06/24/20 0700 06/25/20 0602 06/27/20 0554 06/29/20 0455  WBC 6.1 6.3 5.5 5.0 5.8    NEUTROABS 4.1 4.4 3.1  --   --   HGB 12.7 11.6* 11.3* 11.1* 11.3*  HCT 41.5 39.3 37.3 37.1 39.4  MCV 72.9* 75.1* 74.2* 74.8* 77.6*  PLT 106* 108* 113* 134* 295   Basic Metabolic Panel: Recent Labs  Lab 06/23/20 0658 06/23/20 0658 06/24/20 0700 06/24/20 0700 06/25/20 0602 06/26/20 0554 06/27/20 0554 06/28/20 0454 06/29/20 0455  NA 142   < > 142   < > 140 142 141 141 141  K 4.1   < > 3.9   < > 3.9 3.6 3.7 3.8 4.6  CL 107   < > 107   < > 105 106 105 104 105  CO2 24   < > 27   < > 27 27 27 27 27   GLUCOSE 102*   < > 97   < > 103* 99 106* 108* 102*  BUN 47*   < > 51*   < > 53* 52* 58* 64* 68*  CREATININE 2.22*   < > 2.25*   < > 2.11* 2.26* 2.33* 2.64* 3.30*  CALCIUM 8.3*   < >  8.1*   < > 8.4* 8.5* 8.4* 8.6* 8.7*  MG 2.0  --  2.1  --  2.1  --   --   --   --   PHOS  --   --   --   --  4.1 4.0 3.8 4.3 4.4   < > = values in this interval not displayed.   GFR: Estimated Creatinine Clearance: 13.7 mL/min (A) (by C-G formula based on SCr of 3.3 mg/dL (H)). Liver Function Tests: Recent Labs  Lab 06/25/20 0602 06/26/20 0554 06/27/20 0554 06/28/20 0454 06/29/20 0455  ALBUMIN 3.1* 3.0* 2.8* 2.9* 2.7*   No results for input(s): LIPASE, AMYLASE in the last 168 hours. No results for input(s): AMMONIA in the last 168 hours. Coagulation Profile: No results for input(s): INR, PROTIME in the last 168 hours. Cardiac Enzymes: No results for input(s): CKTOTAL, CKMB, CKMBINDEX, TROPONINI in the last 168 hours. BNP (last 3 results) No results for input(s): PROBNP in the last 8760 hours. HbA1C: No results for input(s): HGBA1C in the last 72 hours. CBG: No results for input(s): GLUCAP in the last 168 hours. Lipid Profile: No results for input(s): CHOL, HDL, LDLCALC, TRIG, CHOLHDL, LDLDIRECT in the last 72 hours. Thyroid Function Tests: No results for input(s): TSH, T4TOTAL, FREET4, T3FREE, THYROIDAB in the last 72 hours. Anemia Panel: No results for input(s): VITAMINB12, FOLATE,  FERRITIN, TIBC, IRON, RETICCTPCT in the last 72 hours. Sepsis Labs: No results for input(s): PROCALCITON, LATICACIDVEN in the last 168 hours.  Recent Results (from the past 240 hour(s))  CULTURE, BLOOD (ROUTINE X 2) w Reflex to ID Panel     Status: None   Collection Time: 06/20/20  5:34 PM   Specimen: BLOOD  Result Value Ref Range Status   Specimen Description BLOOD [facility]  Final   Special Requests   Final    BOTTLES DRAWN AEROBIC AND ANAEROBIC Blood Culture adequate volume   Culture   Final    NO GROWTH 5 DAYS Performed at [facility], [address] [place], [place] [address]    Report Status 06/25/2020 FINAL  Final  CULTURE, BLOOD (ROUTINE X 2) w Reflex to ID Panel     Status: None   Collection Time: 06/20/20  5:34 PM   Specimen: BLOOD  Result Value Ref Range Status   Specimen Description BLOOD RAC  Final   Special Requests   Final    BOTTLES DRAWN AEROBIC AND ANAEROBIC Blood Culture adequate volume   Culture   Final    NO GROWTH 5 DAYS Performed at [facility], [address], [place], [place] [address]    Report Status 06/25/2020 FINAL  Final     Radiology Studies: DG Chest 1 View  Result Date: 06/28/2020 CLINICAL DATA:  COVID-19 positivity with congestive failure EXAM: CHEST  1 VIEW COMPARISON:  06/18/2020 FINDINGS: Cardiac shadow remains enlarged. Aortic calcifications are again seen. Mild vascular congestion is noted although improved from the prior exam. Interval clearing of right-sided effusion and basilar atelectasis is noted. Persistent left basilar opacity is noted and stable. IMPRESSION: Improved aeration particularly in the right lung base. Persistent opacity in the left base is seen. Electronically Signed   By: Inez Catalina M.D.   On: 06/28/2020 14:47    Scheduled Meds: . allopurinol  50 mg Oral Solomon  . atorvastatin  20 mg Oral Solomon  . ferrous sulfate  325 mg Oral Q breakfast  . gabapentin  300 mg Oral TID  . metoprolol succinate  75 mg  Oral Solomon  . midodrine  5 mg Oral TID WC  . polyethylene glycol  17 g Oral Solomon  . senna-docusate  1 tablet Oral BID  . sodium chloride flush  3 mL Intravenous Q12H   Continuous Infusions: . sodium chloride    . albumin human 12.5 g (06/29/20 1312)  . furosemide (LASIX) infusion 4 mg/hr (06/29/20 1340)  . heparin       LOS: 10 days   Time spent: 30 minutes.  Lorella Nimrod, MD [facility]  If 7PM-7AM, please contact night-coverage [email or web address]  06/29/2020, 4:25 PM   This record has been created using Dragon voice recognition software. Errors have been sought and corrected,but may not always be located. Such creation errors do not reflect on the standard of care.

## 2020-06-29 NOTE — Progress Notes (Signed)
Progress Note  Patient Name: Kristy Solomon Date of Encounter: 06/29/2020  Primary Cardiologist: Kate Sable, MD   Subjective   Continues to report improved breathing status and no CP. Feels volume status improving.  Despite hypotension, she denies any dizziness or vision changes. Ambulated in room today.  Denies any CP, SOB/DOE, dizziness/presyncope, or vision changes with ambulation.  Towards the end of our conversation today, she is somewhat somnolent, falling asleep during our conversation.  She is in her recliner and eating lunch today but still falls asleep at times.  Inpatient Medications    Scheduled Meds: . allopurinol  50 mg Oral Daily  . atorvastatin  20 mg Oral Daily  . ferrous sulfate  325 mg Oral Q breakfast  . gabapentin  300 mg Oral TID  . metoprolol succinate  75 mg Oral Daily  . midodrine  5 mg Oral TID WC  . polyethylene glycol  17 g Oral Daily  . Rivaroxaban  15 mg Oral Q supper  . senna-docusate  1 tablet Oral BID  . sodium chloride flush  3 mL Intravenous Q12H   Continuous Infusions: . sodium chloride    . albumin human 12.5 g (06/29/20 1312)  . furosemide (LASIX) infusion     PRN Meds: sodium chloride, acetaminophen, ipratropium-albuterol, ondansetron (ZOFRAN) IV, sodium chloride flush   Vital Signs    Vitals:   06/29/20 0830 06/29/20 1152 06/29/20 1303 06/29/20 1304  BP: 107/73 100/61 (!) 73/51 (!) 73/51  Pulse: 88 100 (!) 40 89  Resp: 16 17    Temp: 99 F (37.2 C) 98.3 F (36.8 C) 98.5 F (36.9 C) 98.5 F (36.9 C)  TempSrc: Oral Oral Oral Oral  SpO2: 99% 100% 99% 99%  Weight:      Height:        Intake/Output Summary (Last 24 hours) at 06/29/2020 1313 Last data filed at 06/29/2020 1153 Gross per 24 hour  Intake 480 ml  Output 825 ml  Net -345 ml   Last 3 Weights 06/29/2020 06/28/2020 06/27/2020  Weight (lbs) 224 lb 14.4 oz 224 lb 3.3 oz 223 lb  Weight (kg) 102.014 kg 101.7 kg 101.152 kg      Telemetry    IRIR,  80s-100s- Personally Reviewed  ECG    No new tracings- Personally Reviewed  Physical Exam   GEN: No acute distress.  In recliner.  Eating lunch. Neck: JVP difficult to assess due to body habitus and patient position. Cardiac: IRIR, no murmurs, rubs, or gallops.  Respiratory: Appears comfortable on nasal cannula oxygen with normal work of breathing.  Anterior auscultation with improvement / resolution of bibasilar crackles previously appreciated. GI: Soft, nontender, non-distended  MS:  1+ to moderate improving bilateral lower extremity edema; No deformity. Neuro:  Nonfocal  Psych: Normal affect   Labs    High Sensitivity Troponin:   Recent Labs  Lab 06/16/2020 0117 06/16/2020 0340  TROPONINIHS 86* 83*      Chemistry Recent Labs  Lab 06/27/20 0554 06/28/20 0454 06/29/20 0455  NA 141 141 141  K 3.7 3.8 4.6  CL 105 104 105  CO2 27 27 27   GLUCOSE 106* 108* 102*  BUN 58* 64* 68*  CREATININE 2.33* 2.64* 3.30*  CALCIUM 8.4* 8.6* 8.7*  ALBUMIN 2.8* 2.9* 2.7*  GFRNONAA 18* 15* 12*  GFRAA 21* 18* 14*  ANIONGAP 9 10 9      Hematology Recent Labs  Lab 06/24/20 0700 06/25/20 0602 06/27/20 0554  WBC 6.3 5.5 5.0  RBC  5.23* 5.03 4.96  HGB 11.6* 11.3* 11.1*  HCT 39.3 37.3 37.1  MCV 75.1* 74.2* 74.8*  MCH 22.2* 22.5* 22.4*  MCHC 29.5* 30.3 29.9*  RDW 19.6* 19.6* 19.9*  PLT 108* 113* 134*    BNP No results for input(s): BNP, PROBNP in the last 168 hours.   DDimer No results for input(s): DDIMER in the last 168 hours.   Radiology    DG Chest 1 View  Result Date: 06/28/2020 CLINICAL DATA:  COVID-19 positivity with congestive failure EXAM: CHEST  1 VIEW COMPARISON:  06/13/2020 FINDINGS: Cardiac shadow remains enlarged. Aortic calcifications are again seen. Mild vascular congestion is noted although improved from the prior exam. Interval clearing of right-sided effusion and basilar atelectasis is noted. Persistent left basilar opacity is noted and stable. IMPRESSION:  Improved aeration particularly in the right lung base. Persistent opacity in the left base is seen. Electronically Signed   By: Inez Catalina M.D.   On: 06/28/2020 14:47    Cardiac Studies   Echo 12/31/2019 1. Left ventricular ejection fraction, by estimation, is 50 to 55%. The  left ventricle has low normal function. The left ventricle has no regional  wall motion abnormalities. There is mild left ventricular hypertrophy.  Left ventricular diastolic  parameters are indeterminate.  2. Right ventricular systolic function is normal. The right ventricular  size is normal. Tricuspid regurgitation signal is inadequate for assessing  PA pressure.  3. Left atrial size was mildly dilated.  4. Right atrial size was mildly dilated.  5. The mitral valve is abnormal. Mild mitral valve regurgitation. No  evidence of mitral stenosis.  6. Tricuspid valve regurgitation is mild to moderate.  7. The aortic valve is tricuspid. Aortic valve regurgitation is not  visualized. No aortic stenosis is present.  8. The inferior vena cava is dilated in size with <50% respiratory  variability, suggesting right atrial pressure of 15 mmHg.   [place]  07/2019 Patient had a min HR of 36 bpm, max HR of 171 bpm, and avg HR of 67 bpm. Predominant underlying rhythm was Sinus Rhythm. First Degree AV Block was present. occasional Supraventricular Tachycardia runs occurred, the run with the fastest interval lasting 4 beats with a max rate of 171 bpm, Isolated SVEs were frequent (5.6%, [id number]), SVE Couplets were rare (<1.0%, 4699),  and SVE Triplets were rare (<1.0%, 508). Isolated VEs were frequent (20.3%, [id number]), VE Couplets were rare (<1.0%, 2113), and VE Triplets were rare (<1.0%, 986).    Patient Profile     86 y.o. female with history of permanent atrial fibrillation on Eliquis (CHA2DS2VASc score of at least  6), HFpEF, stage III-IV CKD, hypertension, hyperlipidemia, prior COVID-19 infection (01/2020), coronary  and aortic atherosclerosis by CT, lymphoma, and who is being seen today for acute on chronic heart failure in the setting of medication noncompliance.  Assessment & Plan    Acute on Chronic diastolic CHF --Reports improving breathing status as has throughout admission.  Volume status likely exacerbated by RVR in the setting of medication noncompliance.  12/31/19 echo showed diastolic dysfunction / IVC dilated in size and elevated RA pressure.    --Remains volume up, though improved from volume status last week. IV diuresis this admission has been complicated by AKI and hypotension. Transitionined to oral torsemide 9/22 then back to IV lasix, Cr continued to climb. Now on lasix infusion, started today.  --Nephrology following 2/2 worsening renal function  / AKI /cardiorenal syndrome. --Daily BMET.  Cr 1.95  2.11  3.30 with  BUN 340  53  68.  Baseline Cr ~1.7-1.8. See below regarding recommendations for [facility] with worsening renal function. Xarelto is not ideal given CrCl.  --Continue I/Os, daily wts. Net -160 cc over the last 24 hours.  Weight 99.7kg  102kg. Office dry weight 198 pounds per primary cardiologist. Continue IV infusion and midodrine. Continue BB for rate control as BP allows.  Further recommendations as indicated by nephrology.  Permanent Atrial Fibrillation with RVR --Afib with RVR, initially diagnosed 10/2019.  --Continue BB as BP allows for rate control.  Current ventricular rates well controlled and under 110bpm. --Renal function declining, yet remains on Xarelto 15mg  and CrCl below 50.  --Xarelto is not ideal given worsening renal function.   --Reports h/o poor tolerance of Eliquis 2/2 stomach discomfort, however.  --May need to transition to Coumadin, though this will need frequent INR checks and pt does not have a good history of compliance.  --Consider transition to IV heparin at this time and until renal function improves as long as blood counts remain stable and no s/sx of active  bleeding. --CHA2DS2VASc score of at least 6 (CHF, HTN, age x2, vascular, female) to prevent thromboembolic stroke. Replete electrolytes with K goal 4.0 and Mg goal 2.0.  HTN with current Hypotension --Current hypotension. Started back on midodrine given hypotension with IV diuresis. Caution with BB - recommend hold parameters with low BP to prevent worsening renal function. Continue to monitor pressures, given SBP 70s today. Current BP 110s.  Hypoalbuminemia --Albumin 2.7.  Likely contributing to overall edema.  IV albumin 9/22.  Continue to monitor.   HLD --Continue statin. LDL 42.   AOCKDIII-IV --AKI /cardiorenal syndrome.  Diuresis has been complicated by renal function. Nephrology following. Continue Lasix infusion per nephrology. Replete electrolytes with K goal 4.0 and Mg goal 2.0.  As above, Xarelto is not ideal given her current renal function.  Recommend transition to alternative anticoagulation until recovery of renal function.  Elevated HS Tn --No CP.  Elevated HS Tn likely in the setting of the above atrial fibrillation and volume overload/ acute on chronic diastolic heart failure.  Suspect supply demand ischemia; however, she has never undergone ischemic evaluation.  EKG without acute ST/T changes.  She is poor candidate for LHC as previously noted.  Continue current BB as BP allows and statin.  Xarelto as renal function allows  in lieu of ASA.    For questions or updates, please contact [place] Please consult [email or web address] for contact info under        Signed, Arvil Chaco, PA-C  06/29/2020, 1:13 PM

## 2020-06-30 ENCOUNTER — Encounter: Payer: Self-pay | Admitting: Internal Medicine

## 2020-06-30 DIAGNOSIS — Z515 Encounter for palliative care: Secondary | ICD-10-CM | POA: Diagnosis not present

## 2020-06-30 DIAGNOSIS — N179 Acute kidney failure, unspecified: Secondary | ICD-10-CM

## 2020-06-30 DIAGNOSIS — N189 Chronic kidney disease, unspecified: Secondary | ICD-10-CM

## 2020-06-30 DIAGNOSIS — N184 Chronic kidney disease, stage 4 (severe): Secondary | ICD-10-CM | POA: Diagnosis not present

## 2020-06-30 DIAGNOSIS — Z7189 Other specified counseling: Secondary | ICD-10-CM

## 2020-06-30 LAB — RENAL FUNCTION PANEL
Albumin: 2.9 g/dL — ABNORMAL LOW (ref 3.5–5.0)
Anion gap: 9 (ref 5–15)
BUN: 74 mg/dL — ABNORMAL HIGH (ref 8–23)
CO2: 27 mmol/L (ref 22–32)
Calcium: 8.6 mg/dL — ABNORMAL LOW (ref 8.9–10.3)
Chloride: 102 mmol/L (ref 98–111)
Creatinine, Ser: 3.67 mg/dL — ABNORMAL HIGH (ref 0.44–1.00)
GFR calc Af Amer: 12 mL/min — ABNORMAL LOW (ref >60)
GFR calc non Af Amer: 10 mL/min — ABNORMAL LOW (ref >60)
Glucose, Bld: 109 mg/dL — ABNORMAL HIGH (ref 70–99)
Phosphorus: 5.3 mg/dL — ABNORMAL HIGH (ref 2.5–4.6)
Potassium: 4.5 mmol/L (ref 3.5–5.1)
Sodium: 138 mmol/L (ref 135–145)

## 2020-06-30 LAB — CBC
HCT: 38.7 % (ref 36.0–46.0)
Hemoglobin: 11.1 g/dL — ABNORMAL LOW (ref 12.0–15.0)
MCH: 21.9 pg — ABNORMAL LOW (ref 26.0–34.0)
MCHC: 28.7 g/dL — ABNORMAL LOW (ref 30.0–36.0)
MCV: 76.2 fL — ABNORMAL LOW (ref 80.0–100.0)
Platelets: 157 10*3/uL (ref 150–400)
RBC: 5.08 MIL/uL (ref 3.87–5.11)
RDW: 19.8 % — ABNORMAL HIGH (ref 11.5–15.5)
WBC: 4.5 10*3/uL (ref 4.0–10.5)
nRBC: 0 % (ref 0.0–0.2)

## 2020-06-30 LAB — APTT
aPTT: 118 seconds — ABNORMAL HIGH (ref 24–36)
aPTT: 95 seconds — ABNORMAL HIGH (ref 24–36)
aPTT: 82 seconds — ABNORMAL HIGH (ref 24–36)

## 2020-06-30 LAB — HEPARIN LEVEL (UNFRACTIONATED): Heparin Unfractionated: 1.35 IU/mL — ABNORMAL HIGH (ref 0.30–0.70)

## 2020-06-30 MED ORDER — FUROSEMIDE 10 MG/ML IJ SOLN
40.0000 mg | Freq: Once | INTRAMUSCULAR | Status: AC
  Administered 2020-06-30: 40 mg via INTRAVENOUS
  Filled 2020-06-30: qty 4

## 2020-06-30 NOTE — Consult Note (Signed)
Consultation Note Date: 06/30/2020   Patient Name: Kristy Solomon  DOB: Jan 11, 1929  MRN: [id number]  Age / Sex: 86 y.o., female  PCP: Jodi Marble, MD Referring Physician: Lorella Nimrod, MD  Reason for Consultation: Establishing goals of care and Psychosocial/spiritual support  HPI/Patient Profile: 86 y.o. female  with past medical history of chronic respiratory failure on 2 L of oxygen at home, chronic diastolic dysfunction heart failure, HTN/HLD, A. fib on chronic anticoagulation, HTN with complications of CKD stage IV, history of malignant lymphoma, aortic atherosclerosis on CT August 2021, COVID-19 infection April 2021 admitted on 06/28/2020 with acute on chronic heart failure with acute on chronic kidney disease.   Clinical Assessment and Goals of Care: Mrs. Mateus is resting quietly in bed, sleeping as I enter.  She wakes easily, greets me, making and mostly keeping eye contact.  She appears chronically ill and frail.  She is alert and oriented, able to make her basic needs known.  There is no family at bedside at this time.  Although Mrs. Colan is alert and oriented, she does have periods of confusion and forgetfulness.  Mrs. Negrete and I talked about her hospital stay.  I asked her what the doctors are telling her.  She becomes tearful at this point stating that her kidneys are not working.  We talked about time for outcomes, medical team is providing all treatment that we can to help her.  I ask what question she has, she tearfully states she has none.  I ask if she would like for me to call her daughter [name] and update her, but Mrs. Burklow declined stating that she was present for this morning's meeting and understands the seriousness of her illness.  We briefly talked about CODE STATUS.  Historically Mrs. Killion had stated that she wanted all care.  I share my concern that if she were to be so  sick to need life support, it would not change her heart failure or her kidney failure.  I shared that we will continue to support her.  PMT to follow-up.  Ask if Mrs. Eades would like to see a chaplain, she says she would.  Chaplain notified.  Mrs. Wesenberg asks if we pray for her.  PMT attempted to see Mrs. Kofoed earlier today, but cardiology conference was in process.  Daughter, Avelino Leeds was present for this.  Detailed conference with attending related to patient condition, chronic illness burden and treatment plan.   PMT to continue to follow.  HCPOA    NEXT OF KIN - daughter Avelino Leeds    SUMMARY OF RECOMMENDATIONS   Time for outcomes Considering code status   Code Status/Advance Care Planning:  Full code -I believe that we will need to work with daughter, [name], for CODE STATUS decisions.  Symptom Management:   Per attending, no additional needs at this time.   Palliative Prophylaxis:   Frequent Pain Assessment and Oral Care  Additional Recommendations (Limitations, Scope, Preferences):  Full Scope Treatment  Psycho-social/Spiritual:   Desire for  further Chaplaincy support:no  Additional Recommendations: Caregiving  Support/Resources and Education on Hospice  Prognosis:  Unable to determine , based on outcomes. 3-6 months or less would not be surprising based on chronic illness burden.   Weeks possible, if kidney function does not stabalize and HD is not offered.   Discharge Planning: to be determined, based on outcomes. anticipate need for STR vs home with hospice care and 24/7 family support.       Primary Diagnoses: Present on Admission: . Acute on chronic heart failure with preserved ejection fraction (HFpEF) ([place]) . Atrial fibrillation with rapid ventricular response ([place]) . HTN (hypertension) . GERD (gastroesophageal reflux disease) . Waldenstrom macroglobulinemia ([place]) . CKD (chronic kidney disease), stage IV ([place]) . Thrombocytopenia  ([place])   I have reviewed the medical record, interviewed the patient and family, and examined the patient. The following aspects are pertinent.  Past Medical History:  Diagnosis Date  . (HFpEF) heart failure with preserved ejection fraction ([place])    a. 12/2019 Echo: EF 50-55%, no rwma, mild LVH. Nl RV fxn. Mildly BAE. Mild MR, mild-mod TR.  Marland Kitchen Anemia in neoplastic disease 06/27/2014  . Aortic atherosclerosis ([place])    a. Seen on CT 05/2020.  Marland Kitchen CKD (chronic kidney disease), stage III-IV   . Coronary artery calcification seen on CT scan 05/2020  . COVID-19 virus infection 01/2020  . Hyperlipidemia   . Hypertension   . 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 Permanent atrial fibrillation ([facility])    a. CHA2DS2VASc = 6-->eliquis.   Social History   Socioeconomic History  . Marital status: Widowed    Spouse name: Not on file  . Number of children: Not on file  . Years of education: Not on file  . Highest education level: Not on file  Occupational History  . Not on file  Tobacco Use  . Smoking status: Never Smoker  . Smokeless tobacco: Never Used  Vaping Use  . Vaping Use: Never used  Substance and Sexual Activity  . Alcohol use: No  . Drug use: No  . Sexual activity: Not on file  Other Topics Concern  . Not on file  Social History Narrative   Lives alone. Daughter assists as needed. Activity very limited by DOE and lower ext edema/wkns.   Social Determinants of Health   Financial Resource Strain:   . Difficulty of Paying Living Expenses: Not on file  Food Insecurity:   . Worried About Charity fundraiser in the Last Year: Not on file  . Ran Out of Food in the Last Year: Not on file  Transportation Needs:   . Lack of Transportation (Medical): Not on file  . Lack of Transportation (Non-Medical): Not on file  Physical Activity:   . Days of Exercise per Week: Not on file  . Minutes of Exercise per Session: Not on file  Stress:   . Feeling of Stress : Not on  file  Social Connections:   . Frequency of Communication with Friends and Family: Not on file  . Frequency of Social Gatherings with Friends and Family: Not on file  . Attends Religious Services: Not on file  . Active Member of Clubs or Organizations: Not on file  . Attends Archivist Meetings: Not on file  . Marital Status: Not on file   Family History  Problem Relation Age of Onset  . Cancer Brother        throat ca  . Cancer Brother  bone cancer   Scheduled Meds: . allopurinol  50 mg Oral Daily  . atorvastatin  20 mg Oral Daily  . ferrous sulfate  325 mg Oral Q breakfast  . gabapentin  300 mg Oral TID  . metoprolol succinate  75 mg Oral Daily  . midodrine  5 mg Oral TID WC  . polyethylene glycol  17 g Oral Daily  . senna-docusate  1 tablet Oral BID  . sodium chloride flush  3 mL Intravenous Q12H   Continuous Infusions: . sodium chloride    . albumin human 12.5 g (06/30/20 0925)  . furosemide (LASIX) infusion 4 mg/hr (06/30/20 0319)  . heparin 900 Units/hr (06/30/20 0319)   PRN Meds:.sodium chloride, acetaminophen, ipratropium-albuterol, ondansetron (ZOFRAN) IV, sodium chloride flush Medications Prior to Admission:  Prior to Admission medications   Medication Sig Start Date End Date Taking? Authorizing Provider  allopurinol (ZYLOPRIM) 100 MG tablet Take 0.5 tablets (50 mg total) by mouth daily. 04/23/20  Yes Corcoran, Drue Second, MD  atorvastatin (LIPITOR) 20 MG tablet Take 20 mg by mouth daily. 12/24/13  Yes [provider]  gabapentin (NEURONTIN) 300 MG capsule Take 300 mg by mouth 3 (three) times daily.    Yes [provider]  Ibrutinib (IMBRUVICA) 420 MG TABS TAKE 1 TABLET (420 MG) BY MOUTH DAILY. 05/20/20  Yes Corcoran, Drue Second, MD  metoprolol succinate (TOPROL-XL) 25 MG 24 hr tablet Take 3 tablets (75 mg total) by mouth 2 (two) times daily. 01/09/20  Yes Fritzi Mandes, MD  potassium chloride (MICRO-K) 10 MEQ CR capsule Take 2 capsules (20  mEq total) by mouth 2 (two) times daily. 06/04/20  Yes Kate Sable, MD  Rivaroxaban (XARELTO) 15 MG TABS tablet Take 1 tablet (15 mg total) by mouth daily with supper. 01/09/20  Yes Fritzi Mandes, MD  torsemide (DEMADEX) 20 MG tablet Take 2 tablets (40 mg total) by mouth 2 (two) times daily. 06/04/20  Yes Agbor-Etang, Aaron Edelman, MD  albuterol (VENTOLIN HFA) 108 (90 Base) MCG/ACT inhaler Inhale 2 puffs into the lungs every 6 (six) hours as needed for wheezing or shortness of breath. 01/09/20   Fritzi Mandes, MD   Allergies  Allergen Reactions  . Eliquis [Apixaban]     abd pain   Review of Systems  Unable to perform ROS: Age    Physical Exam Vitals and nursing note reviewed.  Constitutional:      General: She is not in acute distress.    Appearance: She is obese. She is ill-appearing.  HENT:     Head: Normocephalic and atraumatic.  Cardiovascular:     Rate and Rhythm: Normal rate.  Pulmonary:     Effort: Pulmonary effort is normal. No tachypnea.  Abdominal:     Palpations: Abdomen is soft.     Comments: Soft rounded abdomen  Musculoskeletal:     Right lower leg: Edema present.     Left lower leg: Edema present.  Skin:    General: Skin is warm and dry.  Neurological:     Mental Status: She is alert and oriented to person, place, and time.  Psychiatric:     Comments: Calm and cooperative, tearful     Vital Signs: BP 121/65 (BP Location: Right Arm)   Pulse 96   Temp 98.5 F (36.9 C) (Oral)   Resp 20   Ht 5\' 5"  (1.651 m)   Wt 102 kg   SpO2 100%   BMI 37.43 kg/m  Pain Scale: 0-10   Pain Score: 0-No pain  SpO2: SpO2: 100 % O2 Device:SpO2: 100 % O2 Flow Rate: .O2 Flow Rate (L/min): 2 L/min  IO: Intake/output summary:   Intake/Output Summary (Last 24 hours) at 06/30/2020 1425 Last data filed at 06/30/2020 1345 Gross per 24 hour  Intake 1123.37 ml  Output 1100 ml  Net 23.37 ml    LBM: Last BM Date: 06/29/20 (per pt ) Baseline Weight: Weight: 93.9 kg Most recent  weight: Weight: 102 kg     Palliative Assessment/Data:   Flowsheet Rows     Most Recent Value  Intake Tab  Referral Department Hospitalist  Unit at Time of Referral Cardiac/Telemetry Unit  Palliative Care Primary Diagnosis Cardiac  Date Notified 06/29/20  Palliative Care Type Return patient Palliative Care  Reason for referral Clarify Goals of Care  Date of Admission 06/08/2020  Date first seen by Palliative Care 06/30/20  # of days Palliative referral response time 1 Day(s)  # of days IP prior to Palliative referral 10  Clinical Assessment  Palliative Performance Scale Score 60%  Pain Max last 24 hours Not able to report  Pain Min Last 24 hours Not able to report  Dyspnea Max Last 24 Hours Not able to report  Dyspnea Min Last 24 hours Not able to report  Psychosocial & Spiritual Assessment  Palliative Care Outcomes      Time In: 1500 Time Out: 1550 Time Total: 50 minutes Greater than 50%  of this time was spent counseling and coordinating care related to the above assessment and plan.  Signed by: Drue Novel, NP   Please contact Palliative Medicine Team phone at [phone] for questions and concerns.  For individual provider: See Shea Evans

## 2020-06-30 NOTE — Consult Note (Addendum)
ANTICOAGULATION CONSULT NOTE -   Pharmacy Consult for Heparin infusion Indication: atrial fibrillation  Allergies  Allergen Reactions  . Eliquis [Apixaban]     abd pain    Patient Measurements: Height: 5\' 5"  (165.1 cm) Weight: 102 kg (224 lb 14.4 oz) IBW/kg (Calculated) : 57 Heparin Dosing Weight: 80.5 kg  Vital Signs: Temp: 98.5 F (36.9 C) (09/28 1153) Temp Source: Oral (09/28 1153) BP: 121/65 (09/28 1153) Pulse Rate: 96 (09/28 1153)  Labs: Recent Labs    06/28/20 0454 06/29/20 0455 06/29/20 1626 06/30/20 0114 06/30/20 0320 06/30/20 1050  HGB  --  11.3*  --   --  11.1*  --   HCT  --  39.4  --   --  38.7  --   PLT  --  155  --   --  157  --   APTT  --   --  37* 118*  --  95*  LABPROT  --   --  16.6*  --   --   --   INR  --   --  1.4*  --   --   --   HEPARINUNFRC  --   --  1.57*  --  1.35*  --   CREATININE 2.64* 3.30*  --   --  3.67*  --     Estimated Creatinine Clearance: 12.3 mL/min (A) (by C-G formula based on SCr of 3.67 mg/dL (H)).   Medical History: Past Medical History:  Diagnosis Date  . (HFpEF) heart failure with preserved ejection fraction ([place])    a. 12/2019 Echo: EF 50-55%, no rwma, mild LVH. Nl RV fxn. Mildly BAE. Mild MR, mild-mod TR.  Marland Kitchen Anemia in neoplastic disease 06/27/2014  . Aortic atherosclerosis ([place])    a. Seen on CT 05/2020.  Marland Kitchen CKD (chronic kidney disease), stage III-IV   . Coronary artery calcification seen on CT scan 05/2020  . COVID-19 virus infection 01/2020  . Hyperlipidemia   . Hypertension   . 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 Permanent atrial fibrillation ([facility])    a. CHA2DS2VASc = 6-->eliquis.    Medications:  Rivaroxaban 15 mg daily  Assessment: 86 yo female admitted with acute on chronic HFpEF with PMH significant for chronic diastolic CHF, CKD stage IV, HTN, and Afib for which the patient takes rivaroxaban. Patient did not tolerate Eliquis in the past (stomach discomfort). Patient not an ideal  candidate for Coumadin given need for frequent INR checsk and pt does not have good history of compliance. Pharmacy has been consulted for switching patient from rivaroxaban to IV heparin in light of CrCl 13.46mL/min. Last dose of rivaroxaban was 9/26 at 1630.  Baseline aPTT, PT/INR and anti-Xa levels pending. Baseline Hgb ~11; Plt 86 on 9/19 and have trended up to 155.   0928 @0114  aPTT 118; rate decreased to 900 units/hr 0928 @1050  aPTT 95  Goal of Therapy:  Heparin level 0.3-0.7 units/ml once aPTT and anti-Xa levels being to correlate aPTT 66-102 seconds Monitor platelets by anticoagulation protocol: Yes   Plan:  APTT is therapeutic, will continue current rate at 900 units/hr Check aPTT in 8 hours and daily while on heparin until correlation with anti-Xa level Continue to monitor H&H and platelets  Sherilyn Banker, PharmD Pharmacy Resident  06/30/2020 12:40 PM

## 2020-06-30 NOTE — Plan of Care (Signed)

## 2020-06-30 NOTE — Progress Notes (Signed)
Physical Therapy Treatment Patient Details Name: Kristy Solomon MRN: [id number] DOB: 1928/10/10 Today's Date: 06/30/2020    History of Present Illness Kristy Solomon is an 86 y/o female who was admitted for worsening SOB with an increase in O2 to 3L due to symptoms. In ED, pt was tachycardic and in rapid A-fib w/ HR to 170. PMH includes chronic respiratory failure on 2L O2 continuous, chrinic diastolic dysfunction, A-fib on chronic anticoagulation therapy, HTN with complications of CKD III-IV, covid-19 virus infection April 2021, and history of malignant lymphoma.    PT Comments    Pt seated in recliner chair asleep with empty lunch tray in front of her. Pt noted to be on room air with O2 sats at 98% at rest. Pt agreeable to PT session today and reports that she is feeling a little better today than yesterday. Pt noted to have occasional tremor/muscle jerk in RUE however none noted in RLE. Sit <> stand transfers with min A for boosting hips to full standing position, balance while transitioning, and for eccentric control on descent. Pt required max verbal cues for hand and foot placement during transfers. Pt ambulated a total of 18 feet today using RW with min A for steadying and required 2 seated rest breaks before returning to bed. Pt remained above 98% while ambulating with HR at 74. Pt performed sit <> stand transfers with CGA-min A for promotion of BLE strengthening for carryover to improve transfers. Max A for BLE elevation onto bed and for scooting to [facility] with bed in trendelenburg position. Pt desat to 87% O2 sat with HRat 103 bpm. Pt left in bed positioned for comfort with SpO2 957%, 25 RR, 95 HR. Pt made progress from yesterday's session requiring only assistance of one however has not yet returned to previous level of function during last week's sessions. Continue to recommend STR at discharge from acute stay to maximize functional mobility prior to returning home.    Follow Up  Recommendations  SNF;Supervision for mobility/OOB     Equipment Recommendations  None recommended by PT    Recommendations for Other Services       Precautions / Restrictions Precautions Precautions: Fall Restrictions Weight Bearing Restrictions: No    Mobility  Bed Mobility Overal bed mobility: Needs Assistance Bed Mobility: Sit to Supine       Sit to supine: Max assist   General bed mobility comments: Max A for BLE elevation onto bed with max verbal cues for sequencing and for hand/trunk placment during movements. Bed in trendellenberg with verbal cues for hand placement for pulling to [facility]. Pt required assistance to position BLE to assist with pushing self to scoot towards HOB.  Transfers Overall transfer level: Needs assistance Equipment used: Rolling walker (2 wheeled) Transfers: Sit to/from Stand Sit to Stand: Min assist         General transfer comment: Min A for sit <> stand transfers from chair level for boosting to stand, balance, and control on descent. Pt required verbal and tactile cues to place BUE on armrests to assist with transfer.  Ambulation/Gait Ambulation/Gait assistance: Min assist Gait Distance (Feet): 18 Feet Assistive device: Rolling walker (2 wheeled) Gait Pattern/deviations: Step-through pattern Gait velocity: decreased   General Gait Details: Pt ambulated 8 feet using RW with min A for steadying then required seated rest break before ambulating 8 feet back to chair. Pt with decreased step lengths bilaterally and decreased foot clearance. After sitting in recliner chair, pt ambulated 2 feet back to bed  with RW and min A.   Stairs             Wheelchair Mobility    Modified Rankin (Stroke Patients Only)       Balance Overall balance assessment: Needs assistance Sitting-balance support: Feet supported;Bilateral upper extremity supported Sitting balance-Leahy Scale: Good Sitting balance - Comments: no overt LOB while sitting  with back away from chair with BUE on armrests   Standing balance support: Bilateral upper extremity supported Standing balance-Leahy Scale: Fair Standing balance comment: fair standing balance with min A for balance with BUE support on RW                            Cognition Arousal/Alertness: Awake/alert Behavior During Therapy: WFL for tasks assessed/performed Overall Cognitive Status: Within Functional Limits for tasks assessed                                        Exercises Other Exercises Other Exercises: additional time taken to don brief with patient in standing with CGA for static standing balance with BUE on RW; pt seated to fasten sides of brief as pt fatigued and needed rest break Other Exercises: sit <> stands x 5 with CGA-min A; verbal cues for hand and foot placement; pt stood to RW    General Comments        Pertinent Vitals/Pain Pain Assessment: No/denies pain    Home Living                      Prior Function            PT Goals (current goals can now be found in the care plan section) Acute Rehab PT Goals Patient Stated Goal: to get back to bed PT Goal Formulation: With patient Time For Goal Achievement: 07/23/2020 Potential to Achieve Goals: Good    Frequency    Min 2X/week      PT Plan      Co-evaluation              AM-PAC PT "6 Clicks" Mobility   Outcome Measure  Help needed turning from your back to your side while in a flat bed without using bedrails?: A Lot Help needed moving from lying on your back to sitting on the side of a flat bed without using bedrails?: A Lot Help needed moving to and from a bed to a chair (including a wheelchair)?: A Little Help needed standing up from a chair using your arms (e.g., wheelchair or bedside chair)?: A Little Help needed to walk in hospital room?: A Little Help needed climbing 3-5 steps with a railing? : A Lot 6 Click Score: 15    End of Session  Equipment Utilized During Treatment: Gait belt Activity Tolerance: Patient limited by fatigue Patient left: in bed;with call bell/phone within reach;with chair alarm set;with nursing/sitter in room Nurse Communication: Mobility status PT Visit Diagnosis: Unsteadiness on feet (R26.81);Other abnormalities of gait and mobility (R26.89);Muscle weakness (generalized) (M62.81)     Time: 3335-4562 PT Time Calculation (min) (ACUTE ONLY): 44 min  Charges:                        Vale Haven, SPT   Vale Haven 06/30/2020, 3:46 PM

## 2020-06-30 NOTE — Telephone Encounter (Signed)
Cancelled patients follow up with Kristy Solomon for tomorrow 9/29 as it appears the patient is still admitted with no plan of discharge yet.

## 2020-06-30 NOTE — Progress Notes (Signed)
Mobility Specialist - Progress Note   06/30/20 1200  Mobility  Activity Transferred to/from [facility];Transferred:  Bed to chair  Level of Assistance Minimal assist, patient does 75% or more  Assistive Device Front wheel walker  Distance Ambulated (ft) 6 ft  Mobility Response Tolerated well  Mobility performed by Mobility specialist  $Mobility charge 1 Mobility    Pre-mobility: 96 HR, 121/65 BP, 100% SpO2 Post-mobility: 99 HR, 97/64 BP, 95% SpO2   Pt was lying in bed upon arrival with nurse and daughter present in room. Pt agreed to session. Pt denied pain, nausea, or fatigue. Pt stated she felt a BM coming. Pt was SBA getting EOB and minA in sit-to-stand transfer. Pt was minA getting to [facility] without AD, pt used mobility tech and furniture for assistance. Pt had a loose BM while sitting on BSC. No incontinent episodes this session. Mobility assisted with hygiene. Pt denied SOB several times as [place] O2 was not utilized for this session. Pt transferred from [facility] to recliner with SBA and RW. Overall, pt tolerated session well. Pt was left in recliner with alarm set and all needs in reach. Pt was not put back on O2 after session, as oxygen levels sat at 95% on room air. Nurse was notified.    Kathee Delton Mobility Specialist 06/30/20, 12:59 PM

## 2020-06-30 NOTE — Consult Note (Signed)
ANTICOAGULATION CONSULT NOTE -   Pharmacy Consult for Heparin infusion Indication: atrial fibrillation  Allergies  Allergen Reactions  . Eliquis [Apixaban]     abd pain    Patient Measurements: Height: 5\' 5"  (165.1 cm) Weight: 102 kg (224 lb 14.4 oz) IBW/kg (Calculated) : 57 Heparin Dosing Weight: 80.5 kg  Vital Signs: Temp: 98.5 F (36.9 C) (09/28 1538) Temp Source: Oral (09/28 1538) BP: 102/57 (09/28 1538) Pulse Rate: 72 (09/28 1538)  Labs: Recent Labs    06/28/20 0454 06/29/20 0455 06/29/20 1626 06/29/20 1626 06/30/20 0114 06/30/20 0320 06/30/20 1050 06/30/20 1916  HGB  --  11.3*  --   --   --  11.1*  --   --   HCT  --  39.4  --   --   --  38.7  --   --   PLT  --  155  --   --   --  157  --   --   APTT  --   --  37*   < > 118*  --  95* 82*  LABPROT  --   --  16.6*  --   --   --   --   --   INR  --   --  1.4*  --   --   --   --   --   HEPARINUNFRC  --   --  1.57*  --   --  1.35*  --   --   CREATININE 2.64* 3.30*  --   --   --  3.67*  --   --    < > = values in this interval not displayed.    Estimated Creatinine Clearance: 12.3 mL/min (A) (by C-G formula based on SCr of 3.67 mg/dL (H)).   Medical History: Past Medical History:  Diagnosis Date  . (HFpEF) heart failure with preserved ejection fraction ([place])    a. 12/2019 Echo: EF 50-55%, no rwma, mild LVH. Nl RV fxn. Mildly BAE. Mild MR, mild-mod TR.  Marland Kitchen Anemia in neoplastic disease 06/27/2014  . Aortic atherosclerosis ([place])    a. Seen on CT 05/2020.  Marland Kitchen CKD (chronic kidney disease), stage III-IV   . Coronary artery calcification seen on CT scan 05/2020  . COVID-19 virus infection 01/2020  . Hyperlipidemia   . Hypertension   . 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 Permanent atrial fibrillation (HCC)    a. CHA2DS2VASc = 6-->eliquis.    Medications:  Rivaroxaban 15 mg daily  Assessment: 86 yo female admitted with acute on chronic HFpEF with PMH significant for chronic diastolic CHF,  CKD stage IV, HTN, and Afib for which the patient takes rivaroxaban. Patient did not tolerate Eliquis in the past (stomach discomfort). Patient not an ideal candidate for Coumadin given need for frequent INR checsk and pt does not have good history of compliance. Pharmacy has been consulted for switching patient from rivaroxaban to IV heparin in light of CrCl 13.1mL/min. Last dose of rivaroxaban was 9/26 at 1630.  Baseline aPTT, PT/INR and anti-Xa levels pending. Baseline Hgb ~11; Plt 86 on 9/19 and have trended up to 155.   0928 @0114  aPTT 118; rate decreased to 900 units/hr 0928 @1050  aPTT 95 0928 @1916  aPTT 82  Goal of Therapy:  Heparin level 0.3-0.7 units/ml once aPTT and anti-Xa levels being to correlate aPTT 66-102 seconds Monitor platelets by anticoagulation protocol: Yes   Plan:  --APTT is therapeutic x 2, will continue current rate at 900 units/hr --  Check aPTT / HL tomorrow AM --Continue to monitor H&H and platelets  Benita Gutter 06/30/2020 7:52 PM

## 2020-06-30 NOTE — Progress Notes (Signed)
[place], [place] 06/30/20  Subjective:   Hospital day # 11  Patient awake,alert, in no acute distress.Denies SOB,nausea and vomiting today.  Renal: 09/27 0701 - 09/28 0700 In: 1123.4 [P.O.:960; I.V.:163.1; IV Piggyback:0.2] Out: 1475 [Urine:1475] Lab Results  Component Value Date   CREATININE 3.67 (H) 06/30/2020   CREATININE 3.30 (H) 06/29/2020   CREATININE 2.64 (H) 06/28/2020     Objective:  Vital signs in last 24 hours:  Temp:  [98 F (36.7 C)-98.5 F (36.9 C)] 98 F (36.7 C) (09/28 0731) Pulse Rate:  [40-111] 92 (09/28 0900) Resp:  [16-25] 16 (09/28 0731) BP: (64-124)/(48-91) 102/69 (09/28 0900) SpO2:  [98 %-100 %] 100 % (09/28 0731)  Weight change:  Filed Weights   06/27/20 0317 06/28/20 0835 06/29/20 0537  Weight: 101.2 kg 101.7 kg 102 kg    Intake/Output:    Intake/Output Summary (Last 24 hours) at 06/30/2020 0936 Last data filed at 06/30/2020 0346 Gross per 24 hour  Intake 1123.37 ml  Output 1475 ml  Net -351.63 ml     Physical Exam: General:  Resting in bed, in no acute distress  HEENT  moist oral mucous membranes,2L O2 via nasal canula in place  Pulm/lungs  decreased breath sounds at bases  CVS/Heart  irregular  Abdomen:   Soft, obese  Extremities:  2 +  Peripheral edema  Neurologic:  Alert, oriented  Skin:  No acute rashes          Basic Metabolic Panel:  Recent Labs  Lab 06/24/20 0700 06/24/20 0700 06/25/20 0602 06/25/20 0602 06/26/20 0554 06/26/20 0554 06/27/20 0554 06/27/20 0554 06/28/20 0454 06/29/20 0455 06/30/20 0320  NA 142   < > 140   < > 142  --  141  --  141 141 138  K 3.9   < > 3.9   < > 3.6  --  3.7  --  3.8 4.6 4.5  CL 107   < > 105   < > 106  --  105  --  104 105 102  CO2 27   < > 27   < > 27  --  27  --  27 27 27   GLUCOSE 97   < > 103*   < > 99  --  106*  --  108* 102* 109*  BUN 51*   < > 53*   < > 52*  --  58*  --  64* 68* 74*  CREATININE 2.25*   < > 2.11*   < > 2.26*  --  2.33*   --  2.64* 3.30* 3.67*  CALCIUM 8.1*   < > 8.4*   < > 8.5*   < > 8.4*   < > 8.6* 8.7* 8.6*  MG 2.1  --  2.1  --   --   --   --   --   --   --   --   PHOS  --   --  4.1   < > 4.0  --  3.8  --  4.3 4.4 5.3*   < > = values in this interval not displayed.     CBC: Recent Labs  Lab 06/24/20 0700 06/25/20 0602 06/27/20 0554 06/29/20 0455 06/30/20 0320  WBC 6.3 5.5 5.0 5.8 4.5  NEUTROABS 4.4 3.1  --   --   --   HGB 11.6* 11.3* 11.1* 11.3* 11.1*  HCT 39.3 37.3 37.1 39.4 38.7  MCV 75.1* 74.2* 74.8* 77.6* 76.2*  PLT 108*  113* 134* 155 157      Lab Results  Component Value Date   HEPBSAG Negative 09/11/2018      Microbiology:  Recent Results (from the past 240 hour(s))  CULTURE, BLOOD (ROUTINE X 2) w Reflex to ID Panel     Status: None   Collection Time: 06/20/20  5:34 PM   Specimen: BLOOD  Result Value Ref Range Status   Specimen Description BLOOD [facility]  Final   Special Requests   Final    BOTTLES DRAWN AEROBIC AND ANAEROBIC Blood Culture adequate volume   Culture   Final    NO GROWTH 5 DAYS Performed at [facility], [address] [place], [place] [address]    Report Status 06/25/2020 FINAL  Final  CULTURE, BLOOD (ROUTINE X 2) w Reflex to ID Panel     Status: None   Collection Time: 06/20/20  5:34 PM   Specimen: BLOOD  Result Value Ref Range Status   Specimen Description BLOOD RAC  Final   Special Requests   Final    BOTTLES DRAWN AEROBIC AND ANAEROBIC Blood Culture adequate volume   Culture   Final    NO GROWTH 5 DAYS Performed at [facility], [address], [place], [place] [address]    Report Status 06/25/2020 FINAL  Final    Coagulation Studies: Recent Labs    06/29/20 1626  LABPROT 16.6*  INR 1.4*    Urinalysis: No results for input(s): COLORURINE, LABSPEC, PHURINE, GLUCOSEU, HGBUR, BILIRUBINUR, KETONESUR, PROTEINUR, UROBILINOGEN, NITRITE, LEUKOCYTESUR in the last 72 hours.  Invalid input(s): APPERANCEUR    Imaging: DG Chest  1 View  Result Date: 06/28/2020 CLINICAL DATA:  COVID-19 positivity with congestive failure EXAM: CHEST  1 VIEW COMPARISON:  06/03/2020 FINDINGS: Cardiac shadow remains enlarged. Aortic calcifications are again seen. Mild vascular congestion is noted although improved from the prior exam. Interval clearing of right-sided effusion and basilar atelectasis is noted. Persistent left basilar opacity is noted and stable. IMPRESSION: Improved aeration particularly in the right lung base. Persistent opacity in the left base is seen. Electronically Signed   By: Inez Catalina M.D.   On: 06/28/2020 14:47     Medications:    sodium chloride     albumin human 12.5 g (06/30/20 0925)   furosemide (LASIX) infusion 4 mg/hr (06/30/20 0319)   heparin 900 Units/hr (06/30/20 0319)    allopurinol  50 mg Oral Daily   atorvastatin  20 mg Oral Daily   ferrous sulfate  325 mg Oral Q breakfast   gabapentin  300 mg Oral TID   metoprolol succinate  75 mg Oral Daily   midodrine  5 mg Oral TID WC   polyethylene glycol  17 g Oral Daily   senna-docusate  1 tablet Oral BID   sodium chloride flush  3 mL Intravenous Q12H   sodium chloride, acetaminophen, ipratropium-albuterol, ondansetron (ZOFRAN) IV, sodium chloride flush  Assessment/ Plan:  86 y.o. female with congestive heart failure, hypertension, atrial fibrillation, hyperlipidemia, history of lymphoma admitted on 06/03/2020 for Hypoxia [R09.02] Elevated TSH [R79.89] Acute CHF (congestive heart failure) (HCC) [I50.9] Acute on chronic congestive heart failure, unspecified heart failure type (HCC) [I50.9]   #Acute kidney injury with volume overload  #Chronic kidney disease stage IV.  Baseline creatinine 1.75 from May 08, 2020.  GFR 26-30.  Underlying CKD likely secondary to atherosclerosis, advanced age and hypertension  AKI likely secondary to cardiorenal syndrome  Urine output 1475 ml for the preceding 24 hours, dark amber  urine, draining via  external device  Blood pressures within low normal limits ,on Midodrine Echo results from December 31, 2019 show EF 50 to 50%, normal right ventricular function  serum creatinine today is 3.67, worsening Furosemide IV infusion 4mg  /hr on flow Add IV albumin for oncotic support    LOS: [place] 9/28/20219:36 [place], [place]  N

## 2020-06-30 NOTE — Progress Notes (Signed)
Progress Note  Patient Name: Kristy Solomon Date of Encounter: 06/30/2020  Primary Cardiologist: Kate Sable, MD   Subjective   No CP or SOB. States she feels fine.  Daughter entered room at the time of rounding cardiologist discussion regarding plan of care from this point forward and if an aggressive or interventional approach versus palliative approach were the patient's and family's preference at this time. Palliative care to see and establish goals of care.   Inpatient Medications    Scheduled Meds:  allopurinol  50 mg Oral Daily   atorvastatin  20 mg Oral Daily   ferrous sulfate  325 mg Oral Q breakfast   gabapentin  300 mg Oral TID   metoprolol succinate  75 mg Oral Daily   midodrine  5 mg Oral TID WC   polyethylene glycol  17 g Oral Daily   senna-docusate  1 tablet Oral BID   sodium chloride flush  3 mL Intravenous Q12H   Continuous Infusions:  sodium chloride     albumin human 12.5 g (06/30/20 0925)   furosemide (LASIX) infusion 4 mg/hr (06/30/20 0319)   heparin 900 Units/hr (06/30/20 0319)   PRN Meds: sodium chloride, acetaminophen, ipratropium-albuterol, ondansetron (ZOFRAN) IV, sodium chloride flush   Vital Signs    Vitals:   06/30/20 0700 06/30/20 0731 06/30/20 0801 06/30/20 0900  BP: 116/80 (!) 122/56 124/64 102/69  Pulse: 95 85 80 92  Resp:  16    Temp:  98 F (36.7 C)    TempSrc:  Oral    SpO2:  100%    Weight:      Height:        Intake/Output Summary (Last 24 hours) at 06/30/2020 1012 Last data filed at 06/30/2020 0945 Gross per 24 hour  Intake 1363.37 ml  Output 1475 ml  Net -111.63 ml   Last 3 Weights 06/29/2020 06/28/2020 06/27/2020  Weight (lbs) 224 lb 14.4 oz 224 lb 3.3 oz 223 lb  Weight (kg) 102.014 kg 101.7 kg 101.152 kg      Telemetry    IRIR, 80s-90s, rare PAC- Personally Reviewed  ECG    No new tracings- Personally Reviewed  Physical Exam   GEN: No acute distress.  Lying in bed and joined by family  (daughter). Neck: + JVD, JVP remains elevated at approximately 10cm. Cardiac: IRIR, no murmurs, rubs, or gallops.  Respiratory: Appears comfortable on nasal cannula oxygen with normal work of breathing. Reduced breath sounds bilaterally; however, exam complicated by inability to hold patient up to auscultate lungs. GI: Soft, nontender, non-distended  MS:  1+ to moderate improving bilateral lower extremity edema; No deformity. Neuro:  Nonfocal  Psych: Normal affect   Labs    High Sensitivity Troponin:   Recent Labs  Lab 06/17/2020 0117 06/13/2020 0340  TROPONINIHS 86* 83*      Chemistry Recent Labs  Lab 06/28/20 0454 06/29/20 0455 06/30/20 0320  NA 141 141 138  K 3.8 4.6 4.5  CL 104 105 102  CO2 27 27 27   GLUCOSE 108* 102* 109*  BUN 64* 68* 74*  CREATININE 2.64* 3.30* 3.67*  CALCIUM 8.6* 8.7* 8.6*  ALBUMIN 2.9* 2.7* 2.9*  GFRNONAA 15* 12* 10*  GFRAA 18* 14* 12*  ANIONGAP 10 9 9      Hematology Recent Labs  Lab 06/27/20 0554 06/29/20 0455 06/30/20 0320  WBC 5.0 5.8 4.5  RBC 4.96 5.08 5.08  HGB 11.1* 11.3* 11.1*  HCT 37.1 39.4 38.7  MCV 74.8* 77.6* 76.2*  MCH  22.4* 22.2* 21.9*  MCHC 29.9* 28.7* 28.7*  RDW 19.9* 19.8* 19.8*  PLT 134* 155 157    BNP No results for input(s): BNP, PROBNP in the last 168 hours.   DDimer No results for input(s): DDIMER in the last 168 hours.   Radiology    DG Chest 1 View  Result Date: 06/28/2020 CLINICAL DATA:  COVID-19 positivity with congestive failure EXAM: CHEST  1 VIEW COMPARISON:  06/10/2020 FINDINGS: Cardiac shadow remains enlarged. Aortic calcifications are again seen. Mild vascular congestion is noted although improved from the prior exam. Interval clearing of right-sided effusion and basilar atelectasis is noted. Persistent left basilar opacity is noted and stable. IMPRESSION: Improved aeration particularly in the right lung base. Persistent opacity in the left base is seen. Electronically Signed   By: Inez Catalina M.D.    On: 06/28/2020 14:47    Cardiac Studies   Echo 12/31/2019 1. Left ventricular ejection fraction, by estimation, is 50 to 55%. The  left ventricle has low normal function. The left ventricle has no regional  wall motion abnormalities. There is mild left ventricular hypertrophy.  Left ventricular diastolic  parameters are indeterminate.  2. Right ventricular systolic function is normal. The right ventricular  size is normal. Tricuspid regurgitation signal is inadequate for assessing  PA pressure.  3. Left atrial size was mildly dilated.  4. Right atrial size was mildly dilated.  5. The mitral valve is abnormal. Mild mitral valve regurgitation. No  evidence of mitral stenosis.  6. Tricuspid valve regurgitation is mild to moderate.  7. The aortic valve is tricuspid. Aortic valve regurgitation is not  visualized. No aortic stenosis is present.  8. The inferior vena cava is dilated in size with <50% respiratory  variability, suggesting right atrial pressure of 15 mmHg.   [place]  07/2019 Patient had a min HR of 36 bpm, max HR of 171 bpm, and avg HR of 67 bpm. Predominant underlying rhythm was Sinus Rhythm. First Degree AV Block was present. occasional Supraventricular Tachycardia runs occurred, the run with the fastest interval lasting 4 beats with a max rate of 171 bpm, Isolated SVEs were frequent (5.6%, [id number]), SVE Couplets were rare (<1.0%, 4699),  and SVE Triplets were rare (<1.0%, 508). Isolated VEs were frequent (20.3%, [id number]), VE Couplets were rare (<1.0%, 2113), and VE Triplets were rare (<1.0%, 986).    Patient Profile     86 y.o. female with history of permanent atrial fibrillation on Eliquis (CHA2DS2VASc score of at least  6), HFpEF, stage III-IV CKD, hypertension, hyperlipidemia, prior COVID-19 infection (01/2020), coronary and aortic atherosclerosis by CT, lymphoma, and who is being seen today for acute on chronic heart failure in the setting of medication  noncompliance.  Assessment & Plan    Acute on Chronic diastolic CHF --Denies SOB on [place] oxygen. Reports improving breathing status as has throughout admission.   --Volume status at presentation presumed exacerbated by RVR in the setting of medication noncompliance.   --12/31/19 echo showed diastolic dysfunction / IVC dilated in size and elevated RA pressure.    --IV diuresis  complicated by renal function and low pressure. Followed by nephrology following 2/2 worsening renal function  / ARF /cardiorenal syndrome. HD not idea, which was discussed today.  Cr continues to decline. Cr 3.30 . 4.5 with BUN 68  74.  Baseline Cr ~1.7-1.8.  --Continue I/Os, daily wts. Office dry weight 198 pounds per primary cardiologist. Continue IV infusion and midodrine. Continue BB for rate control as BP allows.  Discussed possible RHC with patient in the future with pt undecided regarding goals of care but indicating that, as long as she feels fine, she would like to continue with catheterization at this time. She will discuss with her daughter. Recommended palliative care yesterday to assist with establishing goals of care.   Permanent Atrial Fibrillation with RVR --Afib with RVR, initially diagnosed 10/2019. Denies sx. --Continue BB as BP allows for rate control.  Current ventricular rates well controlled and under 110bpm. --Renal function declining - discontinued Xarelto 15mg  given CrCl . Started on IV heparin. Reports h/o poor tolerance of Eliquis 2/2 stomach discomfort. Not ideal candidate for Coumadin given need for frequent INR checks and pt does not have a good history of compliance. CHA2DS2VASc score of at least 6 (CHF, HTN, age x2, vascular, female) to prevent thromboembolic stroke.  --Replete electrolytes with K goal 4.0 and Mg goal 2.0. Elevated TSH.  HTN with current Hypotension --Resolving hypotension. Started back on midodrine given hypotension with IV diuresis. BP 121/65.  Hypoalbuminemia -- Continue to  monitor. Per nephrology, adding IV albumin for oncotic support.   HLD --Continue statin. LDL 42.   AOCKDIII-IV --ARF /cardiorenal syndrome.  Diuresis has been complicated by renal function. Nephrology following. Continue Lasix infusion per nephrology. Replete electrolytes with K goal 4.0 and Mg goal 2.0.  IV albumin. As above, Xarelto discontinued and placed on IV heparin for now with transition if benefits outweigh risks to alternative anticoagulation to be determined.. Further recommendations per nephrology.  Elevated HS Tn --No CP.  Elevated HS Tn likely in the setting of the above atrial fibrillation and volume overload/ acute on chronic diastolic heart failure.  Suspect supply demand ischemia; however, she has never undergone ischemic evaluation.  EKG without acute ST/T changes.  She is poor candidate for LHC as previously noted.  Continue current BB as BP allows and statin.  Xarelto discontinued as above.    For questions or updates, please contact Home Please consult [email or web address] for contact info under        Signed, Arvil Chaco, PA-C  06/30/2020, 10:12 AM

## 2020-06-30 NOTE — Progress Notes (Signed)
PROGRESS NOTE    Kristy Solomon  IOX:[id number] DOB: 12-15-1928 DOA: 06/05/2020 PCP: Jodi Marble, MD   Brief Narrative:  Kristy Solomon is an 86 yo AA female with PMH chronic respiratory failure (on 2L [place] O2), HFpEF, permanent afib (on Xarelto), HTN, CKDIV HLD, hx lymphoma, anemia of chronic disease who presented to the hospital with worsening SOB. She had been out of her medications at home for approx a couple weeks.  She was found to be volume overloaded and in afib with RVR. Nephrology and cardiology were consulted on admission.  A CXR was obtained which revealed bilateral pleural effusions and pulmonary edema.  She was started on IV Lasix and resumed back on her beta-blocker. Patient was not responding well to diuresis, becoming more oliguric. Started on Lasix infusion along with midodrine to see if she responds. Patient started diuresing with improved urinary output but worsening creatinine. Not a candidate for HD. Palliative care was consulted.  Subjective: Patient continued to feel lethargic, discussed about worsening renal function. Patient denies any shortness of breath.  Assessment & Plan:   Principal Problem:   Acute on chronic heart failure with preserved ejection fraction (HFpEF) (HCC) Active Problems:   Waldenstrom macroglobulinemia (HCC)   Thrombocytopenia (HCC)   HTN (hypertension)   GERD (gastroesophageal reflux disease)   CKD (chronic kidney disease), stage IV (HCC)   Atrial fibrillation with rapid ventricular response (HCC)   Non compliance w medication regimen   Permanent atrial fibrillation (HCC)  Acute on chronic heart failure with preserved ejection fraction (HFpEF) ([place]).  Cardiology is following-appreciate their recommendation. Patient has a net negative of 5.5L.  Weight seems stable. Initially treated with IV Lasix and then it was held for couple of days due to worsening renal function.  Home dose of torsemide was restarted by cardiology, IV Lasix  at an higher dose was resumed due to poor urinary output. Patient remained oliguric, just made 400 cc over the past 24-hour with IV Lasix of 80 mg twice daily.  Softer blood pressure.  He is not a candidate for HD due to poor functional status. -She was started on Lasix infusion along with midodrine on 06/29/20, improved diuresis with UOP of 1475, creatinine continue to get worse but nephrology is hopeful that it should start improving in the next couple of days. Albumin was also added for oncotic support. -Metoprolol dose was decreased to 75 by cardiology. -Continue with strict intake and output and daily weight. -PT were initially recommending home health services which were ordered but due to persistent decline, reevaluation obtained and they are recommending SNF. -Palliative care was consulted to discuss goals of care.  Hypotension.  Intermittently becoming hypotensive.  There was a huge difference between blood pressure measured on right and left upper extremities.  Right systolic mostly in the 26S with left systolic in 34H and 96Q.  No prior diagnosis of coarctation or subclavian stenosis. -Continue midodrine 5 mg 3 times a day.  Atrial fibrillation with rapid ventricular response ([place]).  Most likely secondary to being out of medications at home.  Currently rate well controlled. -Continue home dose of Toprol. -Xarelto was discontinued due to worsening renal function and she was started on heparin infusion. -Eliquis intolerance was listed in her chart.  GERD (gastroesophageal reflux disease) - continue PPI  HTN (hypertension).  Blood pressure within goal today. Keep holding home antihypertensives as patient is on midodrine now.  AKI with CKD stage IV.  Most likely secondary to cardiorenal syndrome.  Baseline  creatinine around 1.7 per nephrology. Nephrology is following-appreciate their recommendations. Worsening creatinine , which is started diuresing with Lasix infusion.  Not a  candidate for HD.  Remains volume overload. -Continue Lasix infusion along with albumin and midodrine. -Continue to monitor. -Avoid nephrotoxins  Thrombocytopenia ([place]).  Seems stable and improving. - patient has mild chronic thrombocytopenia at baseline ~120-140k - etiology due to her underlying Waldenstrom's macroglobulinemia and effect from ibrutinib; follows with oncology - okay to continue xarelto for now   Waldenstrom macroglobulinemia [facility]) - follows with oncology; last OV 04/23/20 - continue ibrutinib   Objective: Vitals:   06/30/20 0731 06/30/20 0801 06/30/20 0900 06/30/20 1153  BP: (!) 122/56 124/64 102/69 121/65  Pulse: 85 80 92 96  Resp: 16   20  Temp: 98 F (36.7 C)   98.5 F (36.9 C)  TempSrc: Oral   Oral  SpO2: 100%   100%  Weight:      Height:        Intake/Output Summary (Last 24 hours) at 06/30/2020 1428 Last data filed at 06/30/2020 1345 Gross per 24 hour  Intake 1123.37 ml  Output 1100 ml  Net 23.37 ml   Filed Weights   06/27/20 0317 06/28/20 0835 06/29/20 0537  Weight: 101.2 kg 101.7 kg 102 kg    Examination:  General.  Chronically ill-appearing elderly lady, in no acute distress. Pulmonary.  Few basal crackles, normal respiratory effort. CV.  Irregular with normal rate Abdomen.  Soft, nontender, nondistended, BS positive. CNS.  Alert and oriented .  No focal neurologic deficit. Extremities. 2+ LE edema, no cyanosis, pulses intact and symmetrical. Psychiatry.  Judgment and insight appears normal.  DVT prophylaxis: Xarelto Code Status: Full Family Communication:  Daughter was updated on phone. Disposition Plan:  Status is: Inpatient  Remains inpatient appropriate because:Inpatient level of care appropriate due to severity of illness   Dispo: The patient is from: Home              Anticipated d/c is to: Home              Anticipated d/c date is: 2-3 days.              Patient currently is not medically stable to d/c.  Patient is high  risk for readmission, deterioration and death.  Remains volume overload.  History of being noncompliant.  Palliative care was consulted.  Consultants:   Cardiology  Nephrology  Palliative care  Procedures:  Antimicrobials:   Data Reviewed: I have personally reviewed following labs and imaging studies  CBC: Recent Labs  Lab 06/24/20 0700 06/25/20 0602 06/27/20 0554 06/29/20 0455 06/30/20 0320  WBC 6.3 5.5 5.0 5.8 4.5  NEUTROABS 4.4 3.1  --   --   --   HGB 11.6* 11.3* 11.1* 11.3* 11.1*  HCT 39.3 37.3 37.1 39.4 38.7  MCV 75.1* 74.2* 74.8* 77.6* 76.2*  PLT 108* 113* 134* 155 767   Basic Metabolic Panel: Recent Labs  Lab 06/24/20 0700 06/24/20 0700 06/25/20 0602 06/25/20 0602 06/26/20 0554 06/27/20 0554 06/28/20 0454 06/29/20 0455 06/30/20 0320  NA 142   < > 140   < > 142 141 141 141 138  K 3.9   < > 3.9   < > 3.6 3.7 3.8 4.6 4.5  CL 107   < > 105   < > 106 105 104 105 102  CO2 27   < > 27   < > 27 27 27 27 27   GLUCOSE 97   < >  103*   < > 99 106* 108* 102* 109*  BUN 51*   < > 53*   < > 52* 58* 64* 68* 74*  CREATININE 2.25*   < > 2.11*   < > 2.26* 2.33* 2.64* 3.30* 3.67*  CALCIUM 8.1*   < > 8.4*   < > 8.5* 8.4* 8.6* 8.7* 8.6*  MG 2.1  --  2.1  --   --   --   --   --   --   PHOS  --   --  4.1   < > 4.0 3.8 4.3 4.4 5.3*   < > = values in this interval not displayed.   GFR: Estimated Creatinine Clearance: 12.3 mL/min (A) (by C-G formula based on SCr of 3.67 mg/dL (H)). Liver Function Tests: Recent Labs  Lab 06/26/20 0554 06/27/20 0554 06/28/20 0454 06/29/20 0455 06/30/20 0320  ALBUMIN 3.0* 2.8* 2.9* 2.7* 2.9*   No results for input(s): LIPASE, AMYLASE in the last 168 hours. No results for input(s): AMMONIA in the last 168 hours. Coagulation Profile: Recent Labs  Lab 06/29/20 1626  INR 1.4*   Cardiac Enzymes: No results for input(s): CKTOTAL, CKMB, CKMBINDEX, TROPONINI in the last 168 hours. BNP (last 3 results) No results for input(s): PROBNP in the  last 8760 hours. HbA1C: No results for input(s): HGBA1C in the last 72 hours. CBG: No results for input(s): GLUCAP in the last 168 hours. Lipid Profile: No results for input(s): CHOL, HDL, LDLCALC, TRIG, CHOLHDL, LDLDIRECT in the last 72 hours. Thyroid Function Tests: No results for input(s): TSH, T4TOTAL, FREET4, T3FREE, THYROIDAB in the last 72 hours. Anemia Panel: No results for input(s): VITAMINB12, FOLATE, FERRITIN, TIBC, IRON, RETICCTPCT in the last 72 hours. Sepsis Labs: No results for input(s): PROCALCITON, LATICACIDVEN in the last 168 hours.  Recent Results (from the past 240 hour(s))  CULTURE, BLOOD (ROUTINE X 2) w Reflex to ID Panel     Status: None   Collection Time: 06/20/20  5:34 PM   Specimen: BLOOD  Result Value Ref Range Status   Specimen Description BLOOD [facility]  Final   Special Requests   Final    BOTTLES DRAWN AEROBIC AND ANAEROBIC Blood Culture adequate volume   Culture   Final    NO GROWTH 5 DAYS Performed at [facility], [address] [place], [place] [address]    Report Status 06/25/2020 FINAL  Final  CULTURE, BLOOD (ROUTINE X 2) w Reflex to ID Panel     Status: None   Collection Time: 06/20/20  5:34 PM   Specimen: BLOOD  Result Value Ref Range Status   Specimen Description BLOOD RAC  Final   Special Requests   Final    BOTTLES DRAWN AEROBIC AND ANAEROBIC Blood Culture adequate volume   Culture   Final    NO GROWTH 5 DAYS Performed at [facility], [address], [place], [place] [address]    Report Status 06/25/2020 FINAL  Final     Radiology Studies: No results found.  Scheduled Meds: . allopurinol  50 mg Oral Daily  . atorvastatin  20 mg Oral Daily  . ferrous sulfate  325 mg Oral Q breakfast  . gabapentin  300 mg Oral TID  . metoprolol succinate  75 mg Oral Daily  . midodrine  5 mg Oral TID WC  . polyethylene glycol  17 g Oral Daily  . senna-docusate  1 tablet Oral BID  . sodium chloride flush  3 mL Intravenous Q12H  Continuous Infusions: . sodium chloride    . albumin human 12.5 g (06/30/20 0925)  . furosemide (LASIX) infusion 4 mg/hr (06/30/20 0319)  . heparin 900 Units/hr (06/30/20 0319)     LOS: 11 days   Time spent: 30 minutes.  Lorella Nimrod, MD [facility]  If 7PM-7AM, please contact night-coverage [email or web address]  06/30/2020, 2:28 PM   This record has been created using Systems analyst. Errors have been sought and corrected,but may not always be located. Such creation errors do not reflect on the standard of care.

## 2020-06-30 NOTE — Consult Note (Signed)
ANTICOAGULATION CONSULT NOTE -   Pharmacy Consult for Heparin infusion Indication: atrial fibrillation  Allergies  Allergen Reactions  . Eliquis [Apixaban]     abd pain    Patient Measurements: Height: 5\' 5"  (165.1 cm) Weight: 102 kg (224 lb 14.4 oz) IBW/kg (Calculated) : 57 Heparin Dosing Weight: 80.5 kg  Vital Signs: Temp: 98.2 F (36.8 C) (09/27 1900) Temp Source: Oral (09/27 1900) BP: 112/75 (09/27 1900) Pulse Rate: 89 (09/27 1900)  Labs: Recent Labs    06/27/20 0554 06/28/20 0454 06/29/20 0455 06/29/20 1626 06/30/20 0114  HGB 11.1*  --  11.3*  --   --   HCT 37.1  --  39.4  --   --   PLT 134*  --  155  --   --   APTT  --   --   --  37* 118*  LABPROT  --   --   --  16.6*  --   INR  --   --   --  1.4*  --   HEPARINUNFRC  --   --   --  1.57*  --   CREATININE 2.33* 2.64* 3.30*  --   --     Estimated Creatinine Clearance: 13.7 mL/min (A) (by C-G formula based on SCr of 3.3 mg/dL (H)).   Medical History: Past Medical History:  Diagnosis Date  . (HFpEF) heart failure with preserved ejection fraction ([place])    a. 12/2019 Echo: EF 50-55%, no rwma, mild LVH. Nl RV fxn. Mildly BAE. Mild MR, mild-mod TR.  Marland Kitchen Anemia in neoplastic disease 06/27/2014  . Aortic atherosclerosis ([place])    a. Seen on CT 05/2020.  Marland Kitchen CKD (chronic kidney disease), stage III-IV   . Coronary artery calcification seen on CT scan 05/2020  . COVID-19 virus infection 01/2020  . Hyperlipidemia   . Hypertension   . 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 Permanent atrial fibrillation (HCC)    a. CHA2DS2VASc = 6-->eliquis.    Medications:  Rivaroxaban 15 mg daily  Assessment: 86 yo female admitted with acute on chronic HFpEF with PMH significant for chronic diastolic CHF, CKD stage IV, HTN, and Afib for which the patient takes rivaroxaban. Patient did not tolerate Eliquis in the past (stomach discomfort). Pharmacy has been consulted for switching patient from rivaroxaban to IV  heparin in light of CrCl 13.55mL/min. Last dose of rivaroxaban was 9/26 at 1630.  Baseline aPTT, PT/INR and anti-Xa levels pending. Baseline Hgb ~11; Plt 86 on 9/19 and have trended up to 155.   Goal of Therapy:  Heparin level 0.3-0.7 units/ml once aPTT and anti-Xa levels being to correlate aPTT 66-102 seconds Monitor platelets by anticoagulation protocol: Yes   Plan:  Will not bolus as patient was already anticoagulated with Xarelto from 9/17-9/26. Start heparin infusion at 1100 units/hr Check aPTT in 8 hours and daily while on heparin Continue to monitor H&H and platelets  0928 @ 0114 aPTT 118, SUPRAtherapeutic.  Will decrease Heparin to 900 units/hr and recheck aPTT ~ 8 hours after rate reduced.  Continue to monitor CBC  Hart Robinsons A 06/30/2020,2:06 AM

## 2020-07-01 ENCOUNTER — Inpatient Hospital Stay (HOSPITAL_COMMUNITY)
Admit: 2020-07-01 | Discharge: 2020-07-01 | Disposition: A | Discharge status: Home / Self Care | Payer: Medicare HMO | Attending: Internal Medicine | Admitting: Internal Medicine

## 2020-07-01 ENCOUNTER — Ambulatory Visit: Payer: Medicare HMO | Admitting: Physician Assistant

## 2020-07-01 DIAGNOSIS — I4891 Unspecified atrial fibrillation: Secondary | ICD-10-CM | POA: Diagnosis not present

## 2020-07-01 DIAGNOSIS — I5031 Acute diastolic (congestive) heart failure: Secondary | ICD-10-CM

## 2020-07-01 LAB — ECHOCARDIOGRAM COMPLETE
AR max vel: 1.82 cm2
AV Area VTI: 1.73 cm2
AV Area mean vel: 1.55 cm2
AV Mean grad: 2.7 mmHg
AV Peak grad: 4 mmHg
Ao pk vel: 1 m/s
Area-P 1/2: 3.48 cm2
Height: 65 in
S' Lateral: 4.27 cm
Weight: 3545.6 oz

## 2020-07-01 LAB — CBC
HCT: 37.4 % (ref 36.0–46.0)
Hemoglobin: 10.9 g/dL — ABNORMAL LOW (ref 12.0–15.0)
MCH: 22.1 pg — ABNORMAL LOW (ref 26.0–34.0)
MCHC: 29.1 g/dL — ABNORMAL LOW (ref 30.0–36.0)
MCV: 75.9 fL — ABNORMAL LOW (ref 80.0–100.0)
Platelets: 170 10*3/uL (ref 150–400)
RBC: 4.93 MIL/uL (ref 3.87–5.11)
RDW: 20.3 % — ABNORMAL HIGH (ref 11.5–15.5)
WBC: 5 10*3/uL (ref 4.0–10.5)
nRBC: 0 % (ref 0.0–0.2)

## 2020-07-01 LAB — RENAL FUNCTION PANEL
Albumin: 2.9 g/dL — ABNORMAL LOW (ref 3.5–5.0)
Anion gap: 10 (ref 5–15)
BUN: 80 mg/dL — ABNORMAL HIGH (ref 8–23)
CO2: 28 mmol/L (ref 22–32)
Calcium: 8.7 mg/dL — ABNORMAL LOW (ref 8.9–10.3)
Chloride: 99 mmol/L (ref 98–111)
Creatinine, Ser: 3.81 mg/dL — ABNORMAL HIGH (ref 0.44–1.00)
GFR calc Af Amer: 11 mL/min — ABNORMAL LOW (ref >60)
GFR calc non Af Amer: 10 mL/min — ABNORMAL LOW (ref >60)
Glucose, Bld: 107 mg/dL — ABNORMAL HIGH (ref 70–99)
Phosphorus: 4.7 mg/dL — ABNORMAL HIGH (ref 2.5–4.6)
Potassium: 4.4 mmol/L (ref 3.5–5.1)
Sodium: 137 mmol/L (ref 135–145)

## 2020-07-01 LAB — HEPARIN LEVEL (UNFRACTIONATED)
Heparin Unfractionated: 0.57 IU/mL (ref 0.30–0.70)
Heparin Unfractionated: 0.38 IU/mL (ref 0.30–0.70)

## 2020-07-01 LAB — APTT: aPTT: 78 seconds — ABNORMAL HIGH (ref 24–36)

## 2020-07-01 MED ORDER — MIDODRINE HCL 5 MG PO TABS
10.0000 mg | ORAL_TABLET | Freq: Three times a day (TID) | ORAL | Status: DC
  Administered 2020-07-01 – 2020-07-07 (×16): 10 mg via ORAL
  Filled 2020-07-01 (×15): qty 2

## 2020-07-01 MED ORDER — METOPROLOL SUCCINATE ER 50 MG PO TB24
50.0000 mg | ORAL_TABLET | Freq: Every day | ORAL | Status: DC
  Administered 2020-07-01 – 2020-07-07 (×7): 50 mg via ORAL
  Filled 2020-07-01 (×7): qty 1

## 2020-07-01 NOTE — Progress Notes (Signed)
[place], [place] 07/01/20  Subjective:   Hospital day # 12  Patient resting in bed, in no acute distress.Furosemide infusion 8 mg/hr on flow.Patient is on 2L of supplemental O2 via [place],denies SOB. Urine output  1250 ml for the preceding 24 hours   Renal: 09/28 0701 - 09/29 0700 In: 1239.9 [P.O.:960; I.V.:279.9] Out: 1250 [Urine:1250] Lab Results  Component Value Date   CREATININE 3.81 (H) 07/01/2020   CREATININE 3.67 (H) 06/30/2020   CREATININE 3.30 (H) 06/29/2020     Objective:  Vital signs in last 24 hours:  Temp:  [98 F (36.7 C)-99.1 F (37.3 C)] 98.4 F (36.9 C) (09/29 1119) Pulse Rate:  [72-91] 83 (09/29 1119) Resp:  [16-17] 16 (09/29 1119) BP: (102-125)/(57-77) 116/64 (09/29 1119) SpO2:  [92 %-100 %] 100 % (09/29 1119) Weight:  [100.5 kg] 100.5 kg (09/29 0531)  Weight change:  Filed Weights   06/28/20 0835 06/29/20 0537 07/01/20 0531  Weight: 101.7 kg 102 kg 100.5 kg    Intake/Output:    Intake/Output Summary (Last 24 hours) at 07/01/2020 1322 Last data filed at 07/01/2020 0533 Gross per 24 hour  Intake 759.87 ml  Output 950 ml  Net -190.13 ml     Physical Exam: General:  In no acute distress  HEENT  Normocephalic,atraumatic  Pulm/lungs  Lungs diminished at the bases  CVS/Heart  irregular  Abdomen:   Soft, obese  Extremities:  1+  Peripheral edema  Neurologic:  Alert, oriented  Skin:  No acute rashes          Basic Metabolic Panel:  Recent Labs  Lab 06/25/20 0602 06/26/20 0554 06/27/20 0554 06/27/20 0554 06/28/20 0454 06/28/20 0454 06/29/20 0455 06/30/20 0320 07/01/20 0641  NA 140   < > 141  --  141  --  141 138 137  K 3.9   < > 3.7  --  3.8  --  4.6 4.5 4.4  CL 105   < > 105  --  104  --  105 102 99  CO2 27   < > 27  --  27  --  27 27 28   GLUCOSE 103*   < > 106*  --  108*  --  102* 109* 107*  BUN 53*   < > 58*  --  64*  --  68* 74* 80*  CREATININE 2.11*   < > 2.33*  --  2.64*  --  3.30* 3.67* 3.81*   CALCIUM 8.4*   < > 8.4*   < > 8.6*   < > 8.7* 8.6* 8.7*  MG 2.1  --   --   --   --   --   --   --   --   PHOS 4.1   < > 3.8  --  4.3  --  4.4 5.3* 4.7*   < > = values in this interval not displayed.     CBC: Recent Labs  Lab 06/25/20 0602 06/27/20 0554 06/29/20 0455 06/30/20 0320 07/01/20 0641  WBC 5.5 5.0 5.8 4.5 5.0  NEUTROABS 3.1  --   --   --   --   HGB 11.3* 11.1* 11.3* 11.1* 10.9*  HCT 37.3 37.1 39.4 38.7 37.4  MCV 74.2* 74.8* 77.6* 76.2* 75.9*  PLT 113* 134* 155 157 170      Lab Results  Component Value Date   HEPBSAG Negative 09/11/2018      Microbiology:  No results found for this or any previous visit (  from the past 240 hour(s)).  Coagulation Studies: Recent Labs    06/29/20 1626  LABPROT 16.6*  INR 1.4*    Urinalysis: No results for input(s): COLORURINE, LABSPEC, PHURINE, GLUCOSEU, HGBUR, BILIRUBINUR, KETONESUR, PROTEINUR, UROBILINOGEN, NITRITE, LEUKOCYTESUR in the last 72 hours.  Invalid input(s): APPERANCEUR    Imaging: No results found.   Medications:   . sodium chloride    . albumin human 12.5 g (07/01/20 0955)  . furosemide (LASIX) infusion 8 mg/hr (07/01/20 1308)  . heparin 900 Units/hr (06/30/20 1632)   . allopurinol  50 mg Oral Daily  . atorvastatin  20 mg Oral Daily  . ferrous sulfate  325 mg Oral Q breakfast  . gabapentin  300 mg Oral TID  . metoprolol succinate  50 mg Oral Daily  . midodrine  10 mg Oral TID WC  . polyethylene glycol  17 g Oral Daily  . senna-docusate  1 tablet Oral BID  . sodium chloride flush  3 mL Intravenous Q12H   sodium chloride, acetaminophen, ipratropium-albuterol, ondansetron (ZOFRAN) IV, sodium chloride flush  Assessment/ Plan:  86 y.o. female with congestive heart failure, hypertension, atrial fibrillation, hyperlipidemia, history of lymphoma admitted on 06/21/2020 for Hypoxia [R09.02] Elevated TSH [R79.89] Acute CHF (congestive heart failure) (HCC) [I50.9] Acute on chronic congestive heart  failure, unspecified heart failure type (HCC) [I50.9]   #Acute kidney injury with volume overload  #Chronic kidney disease stage IV.  Baseline creatinine 1.75 from May 08, 2020.  GFR 26-30.  Underlying CKD likely secondary to atherosclerosis, advanced age and hypertension  AKI likely secondary to cardiorenal syndrome Lab Results  Component Value Date   CREATININE 3.81 (H) 07/01/2020   CREATININE 3.67 (H) 06/30/2020   CREATININE 3.30 (H) 06/29/2020    Blood pressures within low normal limits ,on Midodrine Metoprolol reduced to 50 mg  Echo results from December 31, 2019 show EF 50 to 50%, normal right ventricular function  serum creatinine today is 3.81, worsening Furosemide IV infusion increased to 8mg  /hr Will continue monitoring closely     LOS: [place] 9/29/20211:22 PM  [place], [place]  N

## 2020-07-01 NOTE — Plan of Care (Signed)
  Patient encouraged out of bed to chair for meals, patient refused.    Problem: Education: Goal: Knowledge of General Education information will improve Description: Including pain rating scale, medication(s)/side effects and non-pharmacologic comfort measures Outcome: Progressing   Problem: Health Behavior/Discharge Planning: Goal: Ability to manage health-related needs will improve Outcome: Progressing   Problem: Clinical Measurements: Goal: Ability to maintain clinical measurements within normal limits will improve Outcome: Progressing Goal: Will remain free from infection Outcome: Progressing Goal: Diagnostic test results will improve Outcome: Progressing Goal: Respiratory complications will improve Outcome: Progressing Goal: Cardiovascular complication will be avoided Outcome: Progressing   Problem: Activity: Goal: Risk for activity intolerance will decrease Outcome: Progressing   Problem: Nutrition: Goal: Adequate nutrition will be maintained Outcome: Progressing   Problem: Coping: Goal: Level of anxiety will decrease Outcome: Progressing   Problem: Elimination: Goal: Will not experience complications related to bowel motility Outcome: Progressing Goal: Will not experience complications related to urinary retention Outcome: Progressing   Problem: Pain Managment: Goal: General experience of comfort will improve Outcome: Progressing   Problem: Safety: Goal: Ability to remain free from injury will improve Outcome: Progressing   Problem: Skin Integrity: Goal: Risk for impaired skin integrity will decrease Outcome: Progressing   Problem: Education: Goal: Ability to demonstrate management of disease process will improve Outcome: Progressing Goal: Ability to verbalize understanding of medication therapies will improve Outcome: Progressing Goal: Individualized Educational Video(s) Outcome: Progressing   Problem: Activity: Goal: Capacity to carry out  activities will improve Outcome: Progressing   Problem: Cardiac: Goal: Ability to achieve and maintain adequate cardiopulmonary perfusion will improve Outcome: Progressing

## 2020-07-01 NOTE — NC FL2 (Signed)
[place] LEVEL OF CARE SCREENING TOOL     IDENTIFICATION  Patient Name: Kristy Solomon Birthdate: 1929-08-30 Sex: female Admission Date (Current Location): 06/21/2020  [place] and [place] Number:  Engineering geologist and Address:  [facility], [address] [place], [place] [address]      Provider Number: [id number]  Attending Physician Name and Address:  Jennye Boroughs, MD  Relative Name and Phone Number:  Daughter Daylene Katayama  [phone]    Current Level of Care: Hospital Recommended Level of Care: [place] Prior Approval Number:    Date Approved/Denied:   PASRR Number: [id number]  Discharge Plan: SNF    Current Diagnoses: Patient Active Problem List   Diagnosis Date Noted  . Permanent atrial fibrillation ([place])   . Acute CHF (congestive heart failure) ([place]) 06/17/2020  . Non compliance w medication regimen 06/28/2020  . Abnormal CXR 05/01/2020  . Palliative care by specialist   . Atrial fibrillation with rapid ventricular response ([place])   . Pneumonia due to COVID-19 virus 01/24/2020  . Renal mass, left 01/13/2020  . Persistent atrial fibrillation ([place])   . Chronic kidney disease, stage 3b   . Acute on chronic heart failure with preserved ejection fraction (HFpEF) ([place]) 01/06/2020  . Acute decompensated heart failure ([place]) 01/03/2020  . Acute on chronic diastolic CHF (congestive heart failure) ([place]) 12/31/2019  . CKD (chronic kidney disease), stage IV ([place]) 12/31/2019  . Atrial fibrillation, chronic ([place]) 12/31/2019  . Elevated troponin 12/31/2019  . Renal insufficiency 07/28/2019  . Bilateral edema of lower extremity 05/07/2019  . HLD (hyperlipidemia) 09/18/2018  . HTN (hypertension) 09/18/2018  . GERD (gastroesophageal reflux disease) 09/18/2018  . Back pain 09/18/2018  . Bradycardia 09/18/2018  . Big[place] 09/18/2018  . Nephrolithiasis 01/11/2018  . Goals of care, counseling/discussion 12/28/2017  .  Gastroenteritis 12/28/2017  . B12 deficiency 03/18/2016  . Folate deficiency 03/18/2016  . Iron deficiency anemia 03/18/2016  . Pneumonia 03/17/2016  . Pressure ulcer 03/17/2016  . Thrombocytopenia ([place]) 07/04/2014  . Acute kidney injury superimposed on chronic kidney disease ([place]) 07/04/2014  . Hyperuricemia 07/04/2014  . 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 Waldenstrom macroglobulinemia ([place]) 12/27/2013  . Calculi, ureter 07/03/2012  . Frank hematuria 07/03/2012  . Hydronephrosis 07/03/2012  . 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 Calculus of kidney 07/02/2012  . Nonspecific finding on examination of urine 07/02/2012    Orientation RESPIRATION BLADDER Height & Weight     Self, Situation, Place  Normal Incontinent Weight: 100.5 kg Height:  5\' 5"  (165.1 cm)  BEHAVIORAL SYMPTOMS/MOOD NEUROLOGICAL BOWEL NUTRITION STATUS      Continent Diet (cardiac prudent)  AMBULATORY STATUS COMMUNICATION OF NEEDS Skin   Extensive Assist Verbally Normal                       Personal Care Assistance Level of Assistance  Bathing, Dressing Bathing Assistance: Limited assistance   Dressing Assistance: Limited assistance     Functional Limitations Info  Sight Sight Info: Adequate (glasses)        SPECIAL CARE FACTORS FREQUENCY  PT (By licensed PT), OT (By licensed OT)     PT Frequency: 5x a week OT Frequency: 5x a week            Contractures Contractures Info: Not present    Additional Factors Info  Code Status, Allergies Code Status Info: Full Allergies Info: apixaban           Current Medications (07/01/2020):  This is the current hospital active medication list Current Facility-Administered Medications  Medication Dose Route Frequency Provider Last Rate Last Admin  . 0.9 %  sodium chloride infusion  250 mL  Intravenous PRN Agbata, Tochukwu, MD      . acetaminophen (TYLENOL) tablet 650 mg  650 mg Oral Q4H PRN Agbata, Tochukwu, MD   650 mg at 06/29/20 0948  . albumin human 25 % solution 12.5 g  12.5 g Intravenous Daily Murlean Iba, MD 60 mL/hr at 07/01/20 0955 12.5 g at 07/01/20 0955  . allopurinol (ZYLOPRIM) tablet 50 mg  50 mg Oral Daily Agbata, Tochukwu, MD   50 mg at 07/01/20 0955  . atorvastatin (LIPITOR) tablet 20 mg  20 mg Oral Daily Agbata, Tochukwu, MD   20 mg at 07/01/20 0956  . ferrous sulfate tablet 325 mg  325 mg Oral Q breakfast Dwyane Dee, MD   325 mg at 07/01/20 0956  . furosemide (LASIX) 250 mg in dextrose 5 % 250 mL (1 mg/mL) infusion  8 mg/hr Intravenous Continuous End, Christopher, MD 8 mL/hr at 06/30/20 1629 8 mg/hr at 06/30/20 1629  . gabapentin (NEURONTIN) capsule 300 mg  300 mg Oral TID Agbata, Tochukwu, MD   300 mg at 07/01/20 0956  . heparin ADULT infusion 100 units/mL (25000 units/266mL sodium chloride 0.45%)  900 Units/hr Intravenous Continuous Hart Robinsons A, RPH 9 mL/hr at 06/30/20 1632 900 Units/hr at 06/30/20 1632  . ipratropium-albuterol (DUONEB) 0.5-2.5 (3) MG/3ML nebulizer solution 3 mL  3 mL Nebulization Q6H PRN Agbata, Tochukwu, MD      . metoprolol succinate (TOPROL-XL) 24 hr tablet 50 mg  50 mg Oral Daily Murlean Iba, MD   50 mg at 07/01/20 0955  . midodrine (PROAMATINE) tablet 10 mg  10 mg Oral TID WC Agbor-Etang, Aaron Edelman, MD      . ondansetron (ZOFRAN) injection 4 mg  4 mg Intravenous Q6H PRN Agbata, Tochukwu, MD      . polyethylene glycol (MIRALAX / GLYCOLAX) packet 17 g  17 g Oral Daily Dwyane Dee, MD   17 g at 06/30/20 0926  . senna-docusate (Senokot-S) tablet 1 tablet  1 tablet Oral BID Dwyane Dee, MD   1 tablet at 07/01/20 0955  . sodium chloride flush (NS) 0.9 % injection 3 mL  3 mL Intravenous Q12H Agbata, Tochukwu, MD   3 mL at 07/01/20 0958  . sodium chloride flush (NS) 0.9 % injection 3 mL  3 mL Intravenous PRN Agbata, Tochukwu, MD        Facility-Administered Medications Ordered in Other Encounters  Medication Dose Route Frequency Provider Last Rate Last Admin  . cyanocobalamin ((VITAMIN B-12)) injection 1,000 mcg  1,000 mcg Intramuscular Once Lequita Asal, MD         Discharge Medications: Please see discharge summary for a list of discharge medications.  Relevant Imaging Results:  Relevant Lab Results:   Additional Information SS# [id number]  Victorino Dike, RN

## 2020-07-01 NOTE — Progress Notes (Signed)
*  PRELIMINARY RESULTS* Echocardiogram 2D Echocardiogram has been performed.  Kristy Solomon 07/01/2020, 12:55 PM

## 2020-07-01 NOTE — Progress Notes (Signed)
Mobility Specialist - Progress Note   07/01/20 1542  Mobility  Activity Refused mobility  Mobility performed by Mobility specialist     Pt asleep in bed upon arrival. Pt easily awakens. Pt refused mobility session this afternoon d/t feeling "tired" and wanting to "rest". Pt educated on the importance of mobility and encouraged to get OOB. Pt shows understanding, but states she is "too tired". Nurse was notified.     Larri Brewton Mobility Specialist  07/01/20, 3:43 PM

## 2020-07-01 NOTE — Progress Notes (Signed)
Progress Note  Patient Name: Kristy Solomon Date of Encounter: 07/01/2020  [facility] Cardiologist: Kate Sable, MD   Subjective   States feeling okay, ate all her breakfast and may actually want some more.  Denies chest pain or shortness of breath.  Still with lower extremity swelling.  Inpatient Medications    Scheduled Meds: . allopurinol  50 mg Oral Daily  . atorvastatin  20 mg Oral Daily  . ferrous sulfate  325 mg Oral Q breakfast  . gabapentin  300 mg Oral TID  . metoprolol succinate  50 mg Oral Daily  . midodrine  5 mg Oral TID WC  . polyethylene glycol  17 g Oral Daily  . senna-docusate  1 tablet Oral BID  . sodium chloride flush  3 mL Intravenous Q12H   Continuous Infusions: . sodium chloride    . albumin human 12.5 g (07/01/20 0955)  . furosemide (LASIX) infusion 8 mg/hr (06/30/20 1629)  . heparin 900 Units/hr (06/30/20 1632)   PRN Meds: sodium chloride, acetaminophen, ipratropium-albuterol, ondansetron (ZOFRAN) IV, sodium chloride flush   Vital Signs    Vitals:   06/30/20 2113 07/01/20 0531 07/01/20 0532 07/01/20 0749  BP: 125/77  117/70 117/77  Pulse: 91  91 83  Resp:    16  Temp: 98.9 F (37.2 C)  99.1 F (37.3 C) 98 F (36.7 C)  TempSrc: Oral  Oral Oral  SpO2: 95%  95% 92%  Weight:  100.5 kg    Height:        Intake/Output Summary (Last 24 hours) at 07/01/2020 1058 Last data filed at 07/01/2020 0533 Gross per 24 hour  Intake 759.87 ml  Output 950 ml  Net -190.13 ml   Last 3 Weights 07/01/2020 06/29/2020 06/28/2020  Weight (lbs) 221 lb 9.6 oz 224 lb 14.4 oz 224 lb 3.3 oz  Weight (kg) 100.517 kg 102.014 kg 101.7 kg      Telemetry    Atrial fibrillation- Personally Reviewed  ECG    No new tracing- Personally Reviewed  Physical Exam   GEN: No acute distress.   Neck: No JVD Cardiac:  Irregular irregular Respiratory:  Decreased breath sounds GI: Soft, nontender, distended  MS: 3+ edema; No deformity. Neuro:  Nonfocal    Psych: Normal affect   Labs    High Sensitivity Troponin:   Recent Labs  Lab 06/23/2020 0117 06/24/2020 0340  TROPONINIHS 86* 83*      Chemistry Recent Labs  Lab 06/29/20 0455 06/30/20 0320 07/01/20 0641  NA 141 138 137  K 4.6 4.5 4.4  CL 105 102 99  CO2 27 27 28   GLUCOSE 102* 109* 107*  BUN 68* 74* 80*  CREATININE 3.30* 3.67* 3.81*  CALCIUM 8.7* 8.6* 8.7*  ALBUMIN 2.7* 2.9* 2.9*  GFRNONAA 12* 10* 10*  GFRAA 14* 12* 11*  ANIONGAP 9 9 10      Hematology Recent Labs  Lab 06/29/20 0455 06/30/20 0320 07/01/20 0641  WBC 5.8 4.5 5.0  RBC 5.08 5.08 4.93  HGB 11.3* 11.1* 10.9*  HCT 39.4 38.7 37.4  MCV 77.6* 76.2* 75.9*  MCH 22.2* 21.9* 22.1*  MCHC 28.7* 28.7* 29.1*  RDW 19.8* 19.8* 20.3*  PLT 155 157 170    BNP No results for input(s): BNP, PROBNP in the last 168 hours.   DDimer No results for input(s): DDIMER in the last 168 hours.   Radiology    No results found.  Cardiac Studies   Echo 12/2019 1. Left ventricular ejection fraction, by  estimation, is 50 to 55%. The  left ventricle has low normal function. The left ventricle has no regional  wall motion abnormalities. There is mild left ventricular hypertrophy.  Left ventricular diastolic  parameters are indeterminate.  2. Right ventricular systolic function is normal. The right ventricular  size is normal. Tricuspid regurgitation signal is inadequate for assessing  PA pressure.  3. Left atrial size was mildly dilated.  4. Right atrial size was mildly dilated.  5. The mitral valve is abnormal. Mild mitral valve regurgitation. No  evidence of mitral stenosis.  6. Tricuspid valve regurgitation is mild to moderate.  7. The aortic valve is tricuspid. Aortic valve regurgitation is not  visualized. No aortic stenosis is present.  8. The inferior vena cava is dilated in size with <50% respiratory  variability, suggesting right atrial pressure of 15 mmHg.  Patient Profile     86 y.o. female with  history of permanent atrial fibrillation, HFpEF, CKD, hypertension lymphoma who presents due to shortness of breath and volume overload, permanent afib.  Assessment & Plan    1.HFpEF -Net -10? cc over the past 24 hours, creatinine worsening -Continue Lasix drip at 8 mg/hr -Discussed with nephrology, will like to keep blood pressure elevated. -Decrease Toprol-XL, increase midodrine as below. -Continue to monitor creatinine.  2.  Permanent atrial fibrillation -Heart rate controlled -CHA2DS2-VASc of 5 (chf, htn, age,gender) -Toprol-XL 50 mg daily -Heparin drip  3.  Hypertension -Decrease Toprol-XL to 50 mg daily  4. CKD -BP elevation may help renal function, discussed with nephrology. -Decrease Toprol-XL to 50 mg daily -Increase midodrine to 10 mg 3 times daily -Continue Lasix drip at current rate -Appreciate input from nephrology  Greater than 50% was spent in counseling and coordination of care with patient Total encounter time 35 minutes      Signed, Kate Sable, MD  07/01/2020, 10:58 AM

## 2020-07-01 NOTE — Plan of Care (Signed)
  Problem: Education: Goal: Knowledge of General Education information will improve Description Including pain rating scale, medication(s)/side effects and non-pharmacologic comfort measures Outcome: Progressing   Problem: Health Behavior/Discharge Planning: Goal: Ability to manage health-related needs will improve Outcome: Progressing   

## 2020-07-01 NOTE — Progress Notes (Addendum)
Progress Note    Kristy Solomon  GHW:[id number] DOB: 03-07-1929  DOA: 06/17/2020 PCP: Jodi Marble, MD      Brief Narrative:    Medical records reviewed and are as summarized below:  Kristy Solomon is a 86 y.o. female       Assessment/Plan:   Principal Problem:   Acute on chronic heart failure with preserved ejection fraction (HFpEF) (HCC) Active Problems:   Waldenstrom macroglobulinemia (HCC)   Thrombocytopenia (HCC)   HTN (hypertension)   GERD (gastroesophageal reflux disease)   CKD (chronic kidney disease), stage IV (HCC)   Atrial fibrillation with rapid ventricular response (HCC)   Non compliance w medication regimen   Permanent atrial fibrillation ([place])  Body mass index is 36.88 kg/m.    PLAN  Continue IV Lasix infusion for CHF exacerbation and CKD stage IV. She is on IV albumin daily for fluid overload. Continue IV heparin infusion for atrial fibrillation and monitor heparin level per protocol. Continue ibrutinib for Waldenstrm's macroglobulinemia.      Diet Order            Diet 2 gram sodium Room service appropriate? Yes; Fluid consistency: Thin; Fluid restriction: 1200 mL Fluid  Diet effective now                    Consultants:  Nephrologist  Cardiologist  Procedures:  None    Medications:   . allopurinol  50 mg Oral Daily  . atorvastatin  20 mg Oral Daily  . ferrous sulfate  325 mg Oral Q breakfast  . gabapentin  300 mg Oral TID  . metoprolol succinate  50 mg Oral Daily  . midodrine  5 mg Oral TID WC  . polyethylene glycol  17 g Oral Daily  . senna-docusate  1 tablet Oral BID  . sodium chloride flush  3 mL Intravenous Q12H   Continuous Infusions: . sodium chloride    . albumin human 12.5 g (07/01/20 0955)  . furosemide (LASIX) infusion 8 mg/hr (06/30/20 1629)  . heparin 900 Units/hr (06/30/20 1632)     Anti-infectives (From admission, onward)   None             Family  Communication/Anticipated D/C date and plan/Code Status   DVT prophylaxis: Place TED hose Start: 06/25/20 1534     Code Status: Full Code  Family Communication: Plan discussed with patient Disposition Plan:    Status is: Inpatient  Remains inpatient appropriate because:IV treatments appropriate due to intensity of illness or inability to take PO and Inpatient level of care appropriate due to severity of illness   Dispo: The patient is from: Home              Anticipated d/c is to: SNF              Anticipated d/c date is: 2 days              Patient currently is not medically stable to d/c.           Subjective:   Interval events noted.  No shortness of breath or chest pain.  She still has swelling in the legs.  Objective:    Vitals:   06/30/20 2113 07/01/20 0531 07/01/20 0532 07/01/20 0749  BP: 125/77  117/70 117/77  Pulse: 91  91 83  Resp:    16  Temp: 98.9 F (37.2 C)  99.1 F (37.3 C) 98 F (36.7 C)  TempSrc:  Oral  Oral Oral  SpO2: 95%  95% 92%  Weight:  100.5 kg    Height:       No data found.   Intake/Output Summary (Last 24 hours) at 07/01/2020 1049 Last data filed at 07/01/2020 0533 Gross per 24 hour  Intake 759.87 ml  Output 950 ml  Net -190.13 ml   Filed Weights   06/28/20 0835 06/29/20 0537 07/01/20 0531  Weight: 101.7 kg 102 kg 100.5 kg    Exam:  GEN: NAD SKIN: No rash EYES: EOMI ENT: MMM CV: RRR PULM: CTA B ABD: soft, obese, NT, +BS CNS: AAO x 3, non focal EXT: B/l leg edema, no tenderness   Data Reviewed:   I have personally reviewed following labs and imaging studies:  Labs: Labs show the following:   Basic Metabolic Panel: Recent Labs  Lab 06/25/20 0602 06/26/20 0554 06/27/20 0554 06/27/20 0554 06/28/20 0454 06/28/20 0454 06/29/20 0455 06/29/20 0455 06/30/20 0320 07/01/20 0641  NA 140   < > 141  --  141  --  141  --  138 137  K 3.9   < > 3.7   < > 3.8   < > 4.6   < > 4.5 4.4  CL 105   < > 105  --  104   --  105  --  102 99  CO2 27   < > 27  --  27  --  27  --  27 28  GLUCOSE 103*   < > 106*  --  108*  --  102*  --  109* 107*  BUN 53*   < > 58*  --  64*  --  68*  --  74* 80*  CREATININE 2.11*   < > 2.33*  --  2.64*  --  3.30*  --  3.67* 3.81*  CALCIUM 8.4*   < > 8.4*  --  8.6*  --  8.7*  --  8.6* 8.7*  MG 2.1  --   --   --   --   --   --   --   --   --   PHOS 4.1   < > 3.8  --  4.3  --  4.4  --  5.3* 4.7*   < > = values in this interval not displayed.   GFR Estimated Creatinine Clearance: 11.8 mL/min (A) (by C-G formula based on SCr of 3.81 mg/dL (H)). Liver Function Tests: Recent Labs  Lab 06/27/20 0554 06/28/20 0454 06/29/20 0455 06/30/20 0320 07/01/20 0641  ALBUMIN 2.8* 2.9* 2.7* 2.9* 2.9*   No results for input(s): LIPASE, AMYLASE in the last 168 hours. No results for input(s): AMMONIA in the last 168 hours. Coagulation profile Recent Labs  Lab 06/29/20 1626  INR 1.4*    CBC: Recent Labs  Lab 06/25/20 0602 06/27/20 0554 06/29/20 0455 06/30/20 0320 07/01/20 0641  WBC 5.5 5.0 5.8 4.5 5.0  NEUTROABS 3.1  --   --   --   --   HGB 11.3* 11.1* 11.3* 11.1* 10.9*  HCT 37.3 37.1 39.4 38.7 37.4  MCV 74.2* 74.8* 77.6* 76.2* 75.9*  PLT 113* 134* 155 157 170   Cardiac Enzymes: No results for input(s): CKTOTAL, CKMB, CKMBINDEX, TROPONINI in the last 168 hours. BNP (last 3 results) No results for input(s): PROBNP in the last 8760 hours. CBG: No results for input(s): GLUCAP in the last 168 hours. D-Dimer: No results for input(s): DDIMER in the last 72 hours. Hgb A1c:  No results for input(s): HGBA1C in the last 72 hours. Lipid Profile: No results for input(s): CHOL, HDL, LDLCALC, TRIG, CHOLHDL, LDLDIRECT in the last 72 hours. Thyroid function studies: No results for input(s): TSH, T4TOTAL, T3FREE, THYROIDAB in the last 72 hours.  Invalid input(s): FREET3 Anemia work up: No results for input(s): VITAMINB12, FOLATE, FERRITIN, TIBC, IRON, RETICCTPCT in the last 72  hours. Sepsis Labs: Recent Labs  Lab 06/27/20 0554 06/29/20 0455 06/30/20 0320 07/01/20 0641  WBC 5.0 5.8 4.5 5.0    Microbiology No results found for this or any previous visit (from the past 240 hour(s)).  Procedures and diagnostic studies:  No results found.             LOS: 12 days   [place] Copywriter, advertising on [email or web address]. If 7PM-7AM, please contact night-coverage at [email or web address]     07/01/2020, 10:49 AM

## 2020-07-01 NOTE — Consult Note (Signed)
ANTICOAGULATION CONSULT NOTE -   Pharmacy Consult for Heparin infusion Indication: atrial fibrillation  Allergies  Allergen Reactions  . Eliquis [Apixaban]     abd pain    Patient Measurements: Height: 5\' 5"  (165.1 cm) Weight: 100.5 kg (221 lb 9.6 oz) IBW/kg (Calculated) : 57 Heparin Dosing Weight: 80.5 kg  Vital Signs: Temp: 98 F (36.7 C) (09/29 0749) Temp Source: Oral (09/29 0749) BP: 117/77 (09/29 0749) Pulse Rate: 83 (09/29 0749)  Labs: Recent Labs     0000 06/29/20 0455 06/29/20 1626 06/30/20 0114 06/30/20 0320 06/30/20 1050 06/30/20 1916 07/01/20 0641  HGB   < > 11.3*  --   --  11.1*  --   --  10.9*  HCT  --  39.4  --   --  38.7  --   --  37.4  PLT  --  155  --   --  157  --   --  170  APTT  --   --  37*   < >  --  95* 82* 78*  LABPROT  --   --  16.6*  --   --   --   --   --   INR  --   --  1.4*  --   --   --   --   --   HEPARINUNFRC  --   --  1.57*  --  1.35*  --   --  0.57  CREATININE  --  3.30*  --   --  3.67*  --   --  3.81*   < > = values in this interval not displayed.    Estimated Creatinine Clearance: 11.8 mL/min (A) (by C-G formula based on SCr of 3.81 mg/dL (H)).   Medical History: Past Medical History:  Diagnosis Date  . (HFpEF) heart failure with preserved ejection fraction ([place])    a. 12/2019 Echo: EF 50-55%, no rwma, mild LVH. Nl RV fxn. Mildly BAE. Mild MR, mild-mod TR.  Marland Kitchen Anemia in neoplastic disease 06/27/2014  . Aortic atherosclerosis ([place])    a. Seen on CT 05/2020.  Marland Kitchen CKD (chronic kidney disease), stage III-IV   . Coronary artery calcification seen on CT scan 05/2020  . COVID-19 virus infection 01/2020  . Hyperlipidemia   . Hypertension   . 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 Permanent atrial fibrillation (HCC)    a. CHA2DS2VASc = 6-->eliquis.    Medications:  Rivaroxaban 15 mg daily  Assessment: 86 yo female admitted with acute on chronic HFpEF with PMH significant for chronic diastolic CHF, CKD stage  IV, HTN, and Afib for which the patient takes rivaroxaban. Patient did not tolerate Eliquis in the past (stomach discomfort/abdominal pain). Patient not an ideal candidate for Coumadin given need for frequent INR checsk and pt does not have good history of compliance. Pharmacy has been consulted for switching patient from rivaroxaban to IV heparin in light of CrCl 13.76mL/min. Last dose of rivaroxaban was 9/26 at 1630.  Baseline aPTT, PT/INR and anti-Xa levels pending. Baseline Hgb ~11; Plt 86 on 9/19 and have trended up to 170.   0928 @0114  aPTT 118; rate decreased to 900 units/hr 0928 @1050  aPTT 95 0928 @1916  aPTT 82 0929 @0641  aPTT 78; HL 0.57  Goal of Therapy:  Heparin level 0.3-0.7 units/ml once aPTT and anti-Xa levels being to correlate aPTT 66-102 seconds Monitor platelets by anticoagulation protocol: Yes   Plan:  --aPTT and HL are therapeutic; aPTT and HL are now correlating, will  continue current rate at 900 units/hr and begin to monitor with HL --Check HL in 8 hours and CBC with AM labs  Sherilyn Banker, PharmD Pharmacy Resident  07/01/2020 7:58 AM

## 2020-07-01 NOTE — Consult Note (Signed)
ANTICOAGULATION CONSULT NOTE -   Pharmacy Consult for Heparin infusion Indication: atrial fibrillation  Allergies  Allergen Reactions  . Eliquis [Apixaban]     abd pain    Patient Measurements: Height: 5\' 5"  (165.1 cm) Weight: 100.5 kg (221 lb 9.6 oz) IBW/kg (Calculated) : 57 Heparin Dosing Weight: 80.5 kg  Vital Signs: Temp: 98.4 F (36.9 C) (09/29 1119) Temp Source: Oral (09/29 1119) BP: 116/64 (09/29 1119) Pulse Rate: 83 (09/29 1119)  Labs: Recent Labs     0000 06/29/20 0455 06/29/20 1626 06/30/20 0114 06/30/20 0320 06/30/20 1050 06/30/20 1916 07/01/20 0641 07/01/20 1456  HGB   < > 11.3*  --   --  11.1*  --   --  10.9*  --   HCT  --  39.4  --   --  38.7  --   --  37.4  --   PLT  --  155  --   --  157  --   --  170  --   APTT  --   --  37*   < >  --  95* 82* 78*  --   LABPROT  --   --  16.6*  --   --   --   --   --   --   INR  --   --  1.4*  --   --   --   --   --   --   HEPARINUNFRC   < >  --  1.57*  --  1.35*  --   --  0.57 0.38  CREATININE  --  3.30*  --   --  3.67*  --   --  3.81*  --    < > = values in this interval not displayed.    Estimated Creatinine Clearance: 11.8 mL/min (A) (by C-G formula based on SCr of 3.81 mg/dL (H)).   Medical History: Past Medical History:  Diagnosis Date  . (HFpEF) heart failure with preserved ejection fraction ([place])    a. 12/2019 Echo: EF 50-55%, no rwma, mild LVH. Nl RV fxn. Mildly BAE. Mild MR, mild-mod TR.  Marland Kitchen Anemia in neoplastic disease 06/27/2014  . Aortic atherosclerosis ([place])    a. Seen on CT 05/2020.  Marland Kitchen CKD (chronic kidney disease), stage III-IV   . Coronary artery calcification seen on CT scan 05/2020  . COVID-19 virus infection 01/2020  . Hyperlipidemia   . Hypertension   . 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 Permanent atrial fibrillation (HCC)    a. CHA2DS2VASc = 6-->eliquis.    Medications:  Rivaroxaban 15 mg daily  Assessment: 86 yo female admitted with acute on chronic HFpEF  with PMH significant for chronic diastolic CHF, CKD stage IV, HTN, and Afib for which the patient takes rivaroxaban. Patient did not tolerate Eliquis in the past (stomach discomfort/abdominal pain). Patient not an ideal candidate for Coumadin given need for frequent INR checsk and pt does not have good history of compliance. Pharmacy has been consulted for switching patient from rivaroxaban to IV heparin in light of CrCl 13.26mL/min. Last dose of rivaroxaban was 9/26 at 1630.  Baseline aPTT, PT/INR and anti-Xa levels pending. Baseline Hgb ~11; Plt 86 on 9/19 and have trended up to 170.   0928 @0114  aPTT 118; rate decreased to 900 units/hr 0928 @1050  aPTT 95 0928 @1916  aPTT 82 0929 @0641  aPTT 78; HL 0.57 therapeutic x 1 0929 @1523  HL 0.38 therapeutic x 2  Goal of Therapy:  Heparin level  0.3-0.7 units/ml once aPTT and anti-Xa levels being to correlate aPTT 66-102 seconds Monitor platelets by anticoagulation protocol: Yes   Plan:  --HL is therapeutic x 2; will continue current rate at 900 units/hr --Check HL/CBC with AM labs  Lu Duffel, PharmD, BCPS Clinical Pharmacist 07/01/2020 4:08 PM

## 2020-07-02 ENCOUNTER — Inpatient Hospital Stay: Payer: Medicare HMO

## 2020-07-02 DIAGNOSIS — I5042 Chronic combined systolic (congestive) and diastolic (congestive) heart failure: Secondary | ICD-10-CM

## 2020-07-02 DIAGNOSIS — I5043 Acute on chronic combined systolic (congestive) and diastolic (congestive) heart failure: Secondary | ICD-10-CM

## 2020-07-02 LAB — RENAL FUNCTION PANEL
Albumin: 2.9 g/dL — ABNORMAL LOW (ref 3.5–5.0)
Anion gap: 13 (ref 5–15)
BUN: 80 mg/dL — ABNORMAL HIGH (ref 8–23)
CO2: 27 mmol/L (ref 22–32)
Calcium: 8.8 mg/dL — ABNORMAL LOW (ref 8.9–10.3)
Chloride: 100 mmol/L (ref 98–111)
Creatinine, Ser: 3.86 mg/dL — ABNORMAL HIGH (ref 0.44–1.00)
GFR calc Af Amer: 11 mL/min — ABNORMAL LOW (ref >60)
GFR calc non Af Amer: 10 mL/min — ABNORMAL LOW (ref >60)
Glucose, Bld: 96 mg/dL (ref 70–99)
Phosphorus: 4.7 mg/dL — ABNORMAL HIGH (ref 2.5–4.6)
Potassium: 4.3 mmol/L (ref 3.5–5.1)
Sodium: 140 mmol/L (ref 135–145)

## 2020-07-02 LAB — CBC
HCT: 36.9 % (ref 36.0–46.0)
Hemoglobin: 11 g/dL — ABNORMAL LOW (ref 12.0–15.0)
MCH: 22.4 pg — ABNORMAL LOW (ref 26.0–34.0)
MCHC: 29.8 g/dL — ABNORMAL LOW (ref 30.0–36.0)
MCV: 75.2 fL — ABNORMAL LOW (ref 80.0–100.0)
Platelets: 181 10*3/uL (ref 150–400)
RBC: 4.91 MIL/uL (ref 3.87–5.11)
RDW: 19.9 % — ABNORMAL HIGH (ref 11.5–15.5)
WBC: 4.2 10*3/uL (ref 4.0–10.5)
nRBC: 0 % (ref 0.0–0.2)

## 2020-07-02 LAB — HEPARIN LEVEL (UNFRACTIONATED): Heparin Unfractionated: 0.35 IU/mL (ref 0.30–0.70)

## 2020-07-02 NOTE — Progress Notes (Signed)
Progress Note  Patient Name: Kristy Solomon Date of Encounter: 07/02/2020  Primary Cardiologist: Kate Sable, MD  Subjective   Feels well this am.  Denies c/p or sob.  Just used bedside commode.  Inpatient Medications    Scheduled Meds: . allopurinol  50 mg Oral Daily  . atorvastatin  20 mg Oral Daily  . ferrous sulfate  325 mg Oral Q breakfast  . gabapentin  300 mg Oral TID  . metoprolol succinate  50 mg Oral Daily  . midodrine  10 mg Oral TID WC  . polyethylene glycol  17 g Oral Daily  . senna-docusate  1 tablet Oral BID  . sodium chloride flush  3 mL Intravenous Q12H   Continuous Infusions: . sodium chloride    . albumin human Stopped (07/01/20 1046)  . furosemide (LASIX) infusion 8 mg/hr (07/02/20 5726)  . heparin 900 Units/hr (07/02/20 2035)   PRN Meds: sodium chloride, acetaminophen, ipratropium-albuterol, ondansetron (ZOFRAN) IV, sodium chloride flush   Vital Signs    Vitals:   07/01/20 1707 07/01/20 2003 07/02/20 0413 07/02/20 0747  BP: 138/86 130/77 121/83 139/89  Pulse: (!) 49 (!) 43 (!) 45 98  Resp: 18 16 17 16   Temp: 98.3 F (36.8 C) 97.7 F (36.5 C) 97.7 F (36.5 C) 97.8 F (36.6 C)  TempSrc: Oral Oral Oral Oral  SpO2: 100% 96% 99% 100%  Weight:   102.3 kg   Height:        Intake/Output Summary (Last 24 hours) at 07/02/2020 0830 Last data filed at 07/02/2020 5974 Gross per 24 hour  Intake 566.85 ml  Output 950 ml  Net -383.15 ml   Filed Weights   06/29/20 0537 07/01/20 0531 07/02/20 0413  Weight: 102 kg 100.5 kg 102.3 kg    Physical Exam   GEN: Obese, in no acute distress.  HEENT: Grossly normal.  Neck: Supple, no JVD, carotid bruits, or masses. Cardiac: IR, IR, no murmurs, rubs, or gallops. No clubbing, cyanosis, 1+ bilat LE edema - skin more dry - much improved.  She cont to have posterior thigh/hip/flank edema. Respiratory:  Respirations regular and unlabored, bibasilar crackles. GI: Soft, nontender, nondistended, BS + x  4. 1+ Flank edema bilat. MS: no deformity or atrophy. Skin: warm and dry, no rash. Neuro:  Strength and sensation are intact. Psych: AAOx3.  Normal affect.  Labs    Chemistry Recent Labs  Lab 06/30/20 0320 07/01/20 0641 07/02/20 0427  NA 138 137 140  K 4.5 4.4 4.3  CL 102 99 100  CO2 27 28 27   GLUCOSE 109* 107* 96  BUN 74* 80* 80*  CREATININE 3.67* 3.81* 3.86*  CALCIUM 8.6* 8.7* 8.8*  ALBUMIN 2.9* 2.9* 2.9*  GFRNONAA 10* 10* 10*  GFRAA 12* 11* 11*  ANIONGAP 9 10 13      Hematology Recent Labs  Lab 06/30/20 0320 07/01/20 0641 07/02/20 0427  WBC 4.5 5.0 4.2  RBC 5.08 4.93 4.91  HGB 11.1* 10.9* 11.0*  HCT 38.7 37.4 36.9  MCV 76.2* 75.9* 75.2*  MCH 21.9* 22.1* 22.4*  MCHC 28.7* 29.1* 29.8*  RDW 19.8* 20.3* 19.9*  PLT 157 170 181    Cardiac Enzymes  Recent Labs  Lab 06/09/2020 0117 06/23/2020 0340  TROPONINIHS 86* 83*       Lipids  Lab Results  Component Value Date   CHOL 99 01/01/2020   HDL 47 01/01/2020   LDLCALC 42 01/01/2020   TRIG 52 01/01/2020   CHOLHDL 2.1 01/01/2020  HbA1c  Lab Results  Component Value Date   HGBA1C 5.8 (H) 01/01/2020    Radiology    DG Chest 1 View  Result Date: 06/28/2020 CLINICAL DATA:  COVID-19 positivity with congestive failure EXAM: CHEST  1 VIEW COMPARISON:  06/10/2020 FINDINGS: Cardiac shadow remains enlarged. Aortic calcifications are again seen. Mild vascular congestion is noted although improved from the prior exam. Interval clearing of right-sided effusion and basilar atelectasis is noted. Persistent left basilar opacity is noted and stable. IMPRESSION: Improved aeration particularly in the right lung base. Persistent opacity in the left base is seen. Electronically Signed   By: Inez Catalina M.D.   On: 06/28/2020 14:47   Telemetry    AFib, 80's to 90's - Personally Reviewed  Cardiac Studies   Echo 12/2019 1. Left ventricular ejection fraction, by estimation, is 50 to 55%. The  left ventricle has low normal  function. The left ventricle has no regional  wall motion abnormalities. There is mild left ventricular hypertrophy.  Left ventricular diastolic  parameters are indeterminate.  2. Right ventricular systolic function is normal. The right ventricular  size is normal. Tricuspid regurgitation signal is inadequate for assessing  PA pressure.  3. Left atrial size was mildly dilated.  4. Right atrial size was mildly dilated.  5. The mitral valve is abnormal. Mild mitral valve regurgitation. No  evidence of mitral stenosis.  6. Tricuspid valve regurgitation is mild to moderate.  7. The aortic valve is tricuspid. Aortic valve regurgitation is not  visualized. No aortic stenosis is present.  8. The inferior vena cava is dilated in size with <50% respiratory  variability, suggesting right atrial pressure of 15 mmHg. _____________   2D Echocardiogram 9.29.2021   1. Left ventricular ejection fraction, by estimation, is 20 to 25%. The  left ventricle has severely decreased function. The left ventricle  demonstrates global hypokinesis. Left ventricular diastolic parameters are  indeterminate.   2. Right ventricular systolic function is mildly reduced. The right  ventricular size is normal.   3. Left atrial size was severely dilated.   4. Right atrial size was severely dilated.   5. Moderate pleural effusion in the left lateral region.   6. The mitral valve is normal in structure. Mild to moderate mitral valve  regurgitation.   7. Tricuspid valve regurgitation is moderate.   8. The aortic valve is tricuspid. Aortic valve regurgitation is not  visualized.    Patient Profile     87 y.o.femalewith history of permanent atrial fibrillation, HFpEF, CKD stage IV, hypertension, lymphoma, and anemia of chronic disease who presented due to shortness of breath and volume overload in the setting of medication noncompliance.  Renal fxn has worsened w/ diuresis this admission.  EF now  20-25%.  Assessment & Plan    1. Acute on HFrEF:  Prior h/o HFpEF, however, echo this admission w/ EF 20-25%.  Admitted 9/17 after several mos of medication noncompliance and massive volume overload.  Course complicated by intermittent hypotension req initiation of midodrine, along w/ worsening renal failure.  Creat now 3.86.  Minus 383 ml overnight and 6.1L since admission.  Wt up 1.8kg since yesterday - ? accuracy.  She feels well this AM and was lying flat when I first walked in the room.  On exam, edema is much improved, though still present in dependent areas, esp posterior thighs/hips/abd flanks.  We discussed echo findings today.  She would like any available therapy.  Ideally, would like to eval filling pressures as I  don't appreciate significant JVD. ? Role of ongoing hypoalbuminemia in peripheral/flank edema.  Will ask nsg to place ReDSVest to assess thoracic fluid.  If nl, would favor backing off of diuretics.  If elevated, will need to consider advanced therapies.  With advanced age and unclear coronary status, she not an ideal inotrope candidate, but may need to consider a short course of low-dose milrinone.  Though I suspect low output, will cont  blocker as HRs prev out of control off of  blocker therapy and she is not a candidate for digoxin.  2.  Acute on chronic renal failure:  BUN/Creat worse today.  Nephrology following and remains on lasix gtt.  Suspect cardiorenal syndrome in the setting of low output CHF.  Check ReDSVest reading.  See above.   3.  Permanent afib: Reasonably rate controlled on  blocker therapy - 80s to 90s.  [place] on hold given worsening renal failure.  Would require warfarin as outpt if renal fxn doesn't improve.  If she were to go home, warfarin would be very challenging as she has a h/o noncompliance and relative immobility, making f/u challenging.  Currently on heparin.  4.  Elevated HsTroponin/Demand ischemia: Mild elevation w/ flat trend.  No c/p this admission  but has h/o c/p as outpt.  Never had isch eval before but cor Ca2+ noted on prior CT.  EF now depressed @ 20-25%.  Unfortunately, worsening renal failure makes her a poor candidate for ischemic eval.  Cont  blocker/statin.  Seen by palliative care.  5.  Hypoalbuminemia;  Albumin cont to trend low @ 2.9.  Per IM.  6.  Microcytic anemia:  Stable.  Signed, Murray Hodgkins, NP  07/02/2020, 8:30 AM    For questions or updates, please contact   Please consult [email or web address] for contact info under Cardiology/STEMI.

## 2020-07-02 NOTE — Progress Notes (Signed)
Progress Note    Kristy Solomon  GLO:[id number] DOB: [birth year]  DOA: 06/30/2020 PCP: Jodi Marble, MD      Brief Narrative:    Medical records reviewed and are as summarized below:  Kristy Solomon is a 86 y.o. female       Assessment/Plan:   Principal Problem:   Acute on chronic combined systolic (congestive) and diastolic (congestive) heart failure (HCC) Active Problems:   [place] macroglobulinemia (HCC)   Thrombocytopenia (HCC)   HTN (hypertension)   GERD (gastroesophageal reflux disease)   CKD (chronic kidney disease), stage IV (HCC)   Atrial fibrillation with rapid ventricular response (HCC)   Non compliance w medication regimen   Permanent atrial fibrillation ([place])  AKI on CKD stage IV  Body mass index is 37.54 kg/m.    PLAN  2D echo done on 07/01/2020 showed EF of 20 to 25% (compared to 50 to 55% on echo done in March 2021). IV Lasix has been discontinued because of worsening kidney function. Continue IV heparin infusion for A. fib and monitor heparin level per protocol. Continue IV albumin for fluid overload. Continue ibrutinib for Waldenstrm's macroglobulinemia. Discharge plan was discussed with case manager, [place].  The cost of ibrutinib has made it difficult for skilled nursing facilities to accept the patient.  I have reached out to her oncologist, Dr. Mike Gip, via secure chat, to see whether patient could be discharged without her ibrutinib.      Diet Order            Diet 2 gram sodium Room service appropriate? Yes; Fluid consistency: Thin; Fluid restriction: 1200 mL Fluid  Diet effective now                    Consultants:  Nephrologist  Cardiologist  Procedures:  None    Medications:   . allopurinol  50 mg Oral Daily  . atorvastatin  20 mg Oral Daily  . ferrous sulfate  325 mg Oral Q breakfast  . gabapentin  300 mg Oral TID  . metoprolol succinate  50 mg Oral Daily  . midodrine  10 mg Oral TID WC   . polyethylene glycol  17 g Oral Daily  . senna-docusate  1 tablet Oral BID  . sodium chloride flush  3 mL Intravenous Q12H   Continuous Infusions: . sodium chloride    . albumin human 12.5 g (07/02/20 0926)  . heparin 900 Units/hr (07/02/20 1884)     Anti-infectives (From admission, onward)   None             Family Communication/Anticipated D/C date and plan/Code Status   DVT prophylaxis: Place TED hose Start: 07/01/20 1615 Place TED hose Start: 06/25/20 1534     Code Status: Full Code  Family Communication: Plan discussed with patient Disposition Plan:    Status is: Inpatient  Remains inpatient appropriate because:IV treatments appropriate due to intensity of illness or inability to take PO and Inpatient level of care appropriate due to severity of illness   Dispo: The patient is from: Home              Anticipated d/c is to: SNF              Anticipated d/c date is: 2 days              Patient currently is not medically stable to d/c.           Subjective:  Interval events noted.  No shortness of breath or chest pain.  She still has swelling in the legs.  Objective:    Vitals:   07/02/20 0413 07/02/20 0747 07/02/20 1131 07/02/20 1550  BP: 121/83 139/89 116/67 (!) 152/99  Pulse: (!) 45 98 81 80  Resp: 17 16 16 17   Temp: 97.7 F (36.5 C) 97.8 F (36.6 C) 98.4 F (36.9 C) 98.6 F (37 C)  TempSrc: Oral Oral    SpO2: 99% 100% 99% 100%  Weight: 102.3 kg     Height:       No data found.   Intake/Output Summary (Last 24 hours) at 07/02/2020 1719 Last data filed at 07/02/2020 1349 Gross per 24 hour  Intake 806.85 ml  Output 1550 ml  Net -743.15 ml   Filed Weights   06/29/20 0537 07/01/20 0531 07/02/20 0413  Weight: 102 kg 100.5 kg 102.3 kg    Exam:  GEN: NAD SKIN: No rash EYES: EOMI ENT: MMM CV: RRR PULM: CTA B ABD: soft, obese, NT, +BS CNS: AAO x 3, non focal EXT: B/l leg edema, no tenderness   Data Reviewed:   I  have personally reviewed following labs and imaging studies:  Labs: Labs show the following:   Basic Metabolic Panel: Recent Labs  Lab 06/28/20 0454 06/28/20 0454 06/29/20 0455 06/29/20 0455 06/30/20 0320 06/30/20 0320 07/01/20 0641 07/02/20 0427  NA 141  --  141  --  138  --  137 140  K 3.8   < > 4.6   < > 4.5   < > 4.4 4.3  CL 104  --  105  --  102  --  99 100  CO2 27  --  27  --  27  --  28 27  GLUCOSE 108*  --  102*  --  109*  --  107* 96  BUN 64*  --  68*  --  74*  --  80* 80*  CREATININE 2.64*  --  3.30*  --  3.67*  --  3.81* 3.86*  CALCIUM 8.6*  --  8.7*  --  8.6*  --  8.7* 8.8*  PHOS 4.3  --  4.4  --  5.3*  --  4.7* 4.7*   < > = values in this interval not displayed.   GFR Estimated Creatinine Clearance: 11.7 mL/min (A) (by C-G formula based on SCr of 3.86 mg/dL (H)). Liver Function Tests: Recent Labs  Lab 06/28/20 0454 06/29/20 0455 06/30/20 0320 07/01/20 0641 07/02/20 0427  ALBUMIN 2.9* 2.7* 2.9* 2.9* 2.9*   No results for input(s): LIPASE, AMYLASE in the last 168 hours. No results for input(s): AMMONIA in the last 168 hours. Coagulation profile Recent Labs  Lab 06/29/20 1626  INR 1.4*    CBC: Recent Labs  Lab 06/27/20 0554 06/29/20 0455 06/30/20 0320 07/01/20 0641 07/02/20 0427  WBC 5.0 5.8 4.5 5.0 4.2  HGB 11.1* 11.3* 11.1* 10.9* 11.0*  HCT 37.1 39.4 38.7 37.4 36.9  MCV 74.8* 77.6* 76.2* 75.9* 75.2*  PLT 134* 155 157 170 181   Cardiac Enzymes: No results for input(s): CKTOTAL, CKMB, CKMBINDEX, TROPONINI in the last 168 hours. BNP (last 3 results) No results for input(s): PROBNP in the last 8760 hours. CBG: No results for input(s): GLUCAP in the last 168 hours. D-Dimer: No results for input(s): DDIMER in the last 72 hours. Hgb A1c: No results for input(s): HGBA1C in the last 72 hours. Lipid Profile: No results for input(s): CHOL, HDL, LDLCALC,  TRIG, CHOLHDL, LDLDIRECT in the last 72 hours. Thyroid function studies: No results for  input(s): TSH, T4TOTAL, T3FREE, THYROIDAB in the last 72 hours.  Invalid input(s): FREET3 Anemia work up: No results for input(s): VITAMINB12, FOLATE, FERRITIN, TIBC, IRON, RETICCTPCT in the last 72 hours. Sepsis Labs: Recent Labs  Lab 06/29/20 0455 06/30/20 0320 07/01/20 0641 07/02/20 0427  WBC 5.8 4.5 5.0 4.2    Microbiology No results found for this or any previous visit (from the past 240 hour(s)).  Procedures and diagnostic studies:  ECHOCARDIOGRAM COMPLETE  Result Date: 07/01/2020    ECHOCARDIOGRAM REPORT   Patient Name:   ILEIGH METTLER Date of Exam: 07/01/2020 Medical Rec #:  [id number]        Height:       65.0 in Accession #:    [id number]       Weight:       221.6 lb Date of Birth:  02-Aug-1929        BSA:          2.066 m Patient Age:    69 years         BP:           116/64 mmHg Patient Gender: F                HR:           83 bpm. Exam Location:  [facility] Procedure: 2D Echo, Cardiac Doppler and Color Doppler Indications:     CHF-acute diastolic 814.48  History:         Patient has prior history of Echocardiogram examinations, most                  recent 12/31/2019. Risk Factors:Dyslipidemia and Hypertension.                  Permanent Atrial fibrillation.  Sonographer:     Sherrie Sport RDCS (AE) Referring Phys:  [id number] [facility]RI[facility] END Diagnosing Phys: Kate Sable MD IMPRESSIONS  1. Left ventricular ejection fraction, by estimation, is 20 to 25%. The left ventricle has severely decreased function. The left ventricle demonstrates global hypokinesis. Left ventricular diastolic parameters are indeterminate.  2. Right ventricular systolic function is mildly reduced. The right ventricular size is normal.  3. Left atrial size was severely dilated.  4. Right atrial size was severely dilated.  5. Moderate pleural effusion in the left lateral region.  6. The mitral valve is normal in structure. Mild to moderate mitral valve regurgitation.  7. Tricuspid valve regurgitation is moderate.  8. The  aortic valve is tricuspid. Aortic valve regurgitation is not visualized. FINDINGS  Left Ventricle: Left ventricular ejection fraction, by estimation, is 20 to 25%. The left ventricle has severely decreased function. The left ventricle demonstrates global hypokinesis. The left ventricular internal cavity size was normal in size. There is no left ventricular hypertrophy. Left ventricular diastolic parameters are indeterminate. Right Ventricle: The right ventricular size is normal. Right vetricular wall thickness was not assessed. Right ventricular systolic function is mildly reduced. Left Atrium: Left atrial size was severely dilated. Right Atrium: Right atrial size was severely dilated. Pericardium: There is no evidence of pericardial effusion. Mitral Valve: The mitral valve is normal in structure. Mild to moderate mitral valve regurgitation. Tricuspid Valve: The tricuspid valve is normal in structure. Tricuspid valve regurgitation is moderate. Aortic Valve: The aortic valve is tricuspid. Aortic valve regurgitation is not visualized. Aortic valve mean gradient measures 2.7 mmHg. Aortic valve peak gradient measures  4.0 mmHg. Aortic valve area, by VTI measures 1.73 cm. Pulmonic Valve: The pulmonic valve was normal in structure. Pulmonic valve regurgitation is not visualized. Aorta: The aortic root is normal in size and structure. Venous: The inferior vena cava was not well visualized. IAS/Shunts: No atrial level shunt detected by color flow Doppler. Additional Comments: There is a moderate pleural effusion in the left lateral region.  LEFT VENTRICLE PLAX 2D LVIDd:         5.21 cm LVIDs:         4.27 cm LV PW:         1.15 cm LV IVS:        0.85 cm LVOT diam:     2.00 cm LV SV:         31 LV SV Index:   15 LVOT Area:     3.14 cm  RIGHT VENTRICLE RV Basal diam:  3.76 cm RV S prime:     10.20 cm/s TAPSE (M-mode): 3.8 cm LEFT ATRIUM              Index       RIGHT ATRIUM           Index LA diam:        4.50 cm  2.18 cm/m   RA Area:     32.40 cm LA Vol (A2C):   156.0 ml 75.50 ml/m RA Volume:   115.00 ml 55.66 ml/m LA Vol (A4C):   126.0 ml 60.98 ml/m LA Biplane Vol: 145.0 ml 70.18 ml/m  AORTIC VALVE                   PULMONIC VALVE AV Area (Vmax):    1.82 cm    PV Vmax:        0.80 m/s AV Area (Vmean):   1.55 cm    PV Peak grad:   2.5 mmHg AV Area (VTI):     1.73 cm    RVOT Peak grad: 2 mmHg AV Vmax:           99.57 cm/s AV Vmean:          74.233 cm/s AV VTI:            0.181 m AV Peak Grad:      4.0 mmHg AV Mean Grad:      2.7 mmHg LVOT Vmax:         57.60 cm/s LVOT Vmean:        36.600 cm/s LVOT VTI:          0.100 m LVOT/AV VTI ratio: 0.55  AORTA Ao Root diam: 3.00 cm MITRAL VALVE                TRICUSPID VALVE MV Area (PHT): 3.48 cm     TR Peak grad:   43.6 mmHg MV Decel Time: 218 msec     TR Vmax:        330.00 cm/s MV E velocity: 120.00 cm/s                             SHUNTS                             Systemic VTI:  0.10 m  Systemic Diam: 2.00 cm Kate Sable MD Electronically signed by Kate Sable MD Signature Date/Time: 07/01/2020/3:47:24 PM    Final                LOS: 13 days   Dejan Angert  [facility]   Pager on [email or web address]. If 7PM-7AM, please contact night-coverage at [email or web address]     07/02/2020, 5:19 PM

## 2020-07-02 NOTE — Progress Notes (Signed)
ReDS Vest reading  26%

## 2020-07-02 NOTE — Progress Notes (Signed)
[facility], [place] 07/02/20  Subjective:   Hospital day # 13  Patient resting in bed, alert,oriented,reports consuming her breakfast, no nausea or vomiting. Reports feeling much better.No SOB, on 2L of supplemental O2 via [place], continues to be on Furosemide infusion 8mg /hr Recorded Urine output for preceding 24 hours is 950 ml   Renal: 09/29 0701 - 09/30 0700 In: 566.9 [I.V.:490.5; IV Piggyback:76.4] Out: 950 [Urine:950] Lab Results  Component Value Date   CREATININE 3.86 (H) 07/02/2020   CREATININE 3.81 (H) 07/01/2020   CREATININE 3.67 (H) 06/30/2020     Objective:  Vital signs in last 24 hours:  Temp:  [97.7 F (36.5 C)-98.4 F (36.9 C)] 98.4 F (36.9 C) (09/30 1131) Pulse Rate:  [43-98] 81 (09/30 1131) Resp:  [16-18] 16 (09/30 1131) BP: (116-139)/(67-89) 116/67 (09/30 1131) SpO2:  [96 %-100 %] 99 % (09/30 1131) Weight:  [102.3 kg] 102.3 kg (09/30 0413)  Weight change: 1.814 kg Filed Weights   06/29/20 0537 07/01/20 0531 07/02/20 0413  Weight: 102 kg 100.5 kg 102.3 kg    Intake/Output:    Intake/Output Summary (Last 24 hours) at 07/02/2020 1425 Last data filed at 07/02/2020 1349 Gross per 24 hour  Intake 806.85 ml  Output 1550 ml  Net -743.15 ml     Physical Exam: General:  In no acute distress  HEENT  Normocephalic,atraumatic  Pulm/lungs  Lungs diminished at the bases  CVS/Heart  irregular  Abdomen:   Soft, obese  Extremities:  1+  Peripheral edema  Neurologic:  Alert, oriented  Skin:  No acute rashes    Basic Metabolic Panel:  Recent Labs  Lab 06/28/20 0454 06/28/20 0454 06/29/20 0455 06/29/20 0455 06/30/20 0320 07/01/20 0641 07/02/20 0427  NA 141  --  141  --  138 137 140  K 3.8  --  4.6  --  4.5 4.4 4.3  CL 104  --  105  --  102 99 100  CO2 27  --  27  --  27 28 27   GLUCOSE 108*  --  102*  --  109* 107* 96  BUN 64*  --  68*  --  74* 80* 80*  CREATININE 2.64*  --  3.30*  --  3.67* 3.81* 3.86*  CALCIUM 8.6*    < > 8.7*   < > 8.6* 8.7* 8.8*  PHOS 4.3  --  4.4  --  5.3* 4.7* 4.7*   < > = values in this interval not displayed.     CBC: Recent Labs  Lab 06/27/20 0554 06/29/20 0455 06/30/20 0320 07/01/20 0641 07/02/20 0427  WBC 5.0 5.8 4.5 5.0 4.2  HGB 11.1* 11.3* 11.1* 10.9* 11.0*  HCT 37.1 39.4 38.7 37.4 36.9  MCV 74.8* 77.6* 76.2* 75.9* 75.2*  PLT 134* 155 157 170 181      Lab Results  Component Value Date   HEPBSAG Negative 09/11/2018      Microbiology:  No results found for this or any previous visit (from the past 240 hour(s)).  Coagulation Studies: Recent Labs    06/29/20 1626  LABPROT 16.6*  INR 1.4*    Urinalysis: No results for input(s): COLORURINE, LABSPEC, PHURINE, GLUCOSEU, HGBUR, BILIRUBINUR, KETONESUR, PROTEINUR, UROBILINOGEN, NITRITE, LEUKOCYTESUR in the last 72 hours.  Invalid input(s): APPERANCEUR    Imaging: ECHOCARDIOGRAM COMPLETE  Result Date: 07/01/2020    ECHOCARDIOGRAM REPORT   Patient Name:   Kristy Solomon Date of Exam: 07/01/2020 Medical Rec #:  [id number]  Height:       65.0 in Accession #:    [id number]       Weight:       221.6 lb Date of Birth:  1929/01/24        BSA:          2.066 m Patient Age:    86 years         BP:           116/64 mmHg Patient Gender: F                HR:           83 bpm. Exam Location:  [facility] Procedure: 2D Echo, Cardiac Doppler and Color Doppler Indications:     CHF-acute diastolic 557.32  History:         Patient has prior history of Echocardiogram examinations, most                  recent 12/31/2019. Risk Factors:Dyslipidemia and Hypertension.                  Permanent Atrial fibrillation.  Sonographer:     Sherrie Sport RDCS (AE) Referring Phys:  [phone] CHRISTOPHER END Diagnosing Phys: Kate Sable MD IMPRESSIONS  1. Left ventricular ejection fraction, by estimation, is 20 to 25%. The left ventricle has severely decreased function. The left ventricle demonstrates global hypokinesis. Left ventricular diastolic  parameters are indeterminate.  2. Right ventricular systolic function is mildly reduced. The right ventricular size is normal.  3. Left atrial size was severely dilated.  4. Right atrial size was severely dilated.  5. Moderate pleural effusion in the left lateral region.  6. The mitral valve is normal in structure. Mild to moderate mitral valve regurgitation.  7. Tricuspid valve regurgitation is moderate.  8. The aortic valve is tricuspid. Aortic valve regurgitation is not visualized. FINDINGS  Left Ventricle: Left ventricular ejection fraction, by estimation, is 20 to 25%. The left ventricle has severely decreased function. The left ventricle demonstrates global hypokinesis. The left ventricular internal cavity size was normal in size. There is no left ventricular hypertrophy. Left ventricular diastolic parameters are indeterminate. Right Ventricle: The right ventricular size is normal. Right vetricular wall thickness was not assessed. Right ventricular systolic function is mildly reduced. Left Atrium: Left atrial size was severely dilated. Right Atrium: Right atrial size was severely dilated. Pericardium: There is no evidence of pericardial effusion. Mitral Valve: The mitral valve is normal in structure. Mild to moderate mitral valve regurgitation. Tricuspid Valve: The tricuspid valve is normal in structure. Tricuspid valve regurgitation is moderate. Aortic Valve: The aortic valve is tricuspid. Aortic valve regurgitation is not visualized. Aortic valve mean gradient measures 2.7 mmHg. Aortic valve peak gradient measures 4.0 mmHg. Aortic valve area, by VTI measures 1.73 cm. Pulmonic Valve: The pulmonic valve was normal in structure. Pulmonic valve regurgitation is not visualized. Aorta: The aortic root is normal in size and structure. Venous: The inferior vena cava was not well visualized. IAS/Shunts: No atrial level shunt detected by color flow Doppler. Additional Comments: There is a moderate pleural effusion in  the left lateral region.  LEFT VENTRICLE PLAX 2D LVIDd:         5.21 cm LVIDs:         4.27 cm LV PW:         1.15 cm LV IVS:        0.85 cm LVOT diam:     2.00 cm LV  SV:         31 LV SV Index:   15 LVOT Area:     3.14 cm  RIGHT VENTRICLE RV Basal diam:  3.76 cm RV S prime:     10.20 cm/s TAPSE (M-mode): 3.8 cm LEFT ATRIUM              Index       RIGHT ATRIUM           Index LA diam:        4.50 cm  2.18 cm/m  RA Area:     32.40 cm LA Vol (A2C):   156.0 ml 75.50 ml/m RA Volume:   115.00 ml 55.66 ml/m LA Vol (A4C):   126.0 ml 60.98 ml/m LA Biplane Vol: 145.0 ml 70.18 ml/m  AORTIC VALVE                   PULMONIC VALVE AV Area (Vmax):    1.82 cm    PV Vmax:        0.80 m/s AV Area (Vmean):   1.55 cm    PV Peak grad:   2.5 mmHg AV Area (VTI):     1.73 cm    RVOT Peak grad: 2 mmHg AV Vmax:           99.57 cm/s AV Vmean:          74.233 cm/s AV VTI:            0.181 m AV Peak Grad:      4.0 mmHg AV Mean Grad:      2.7 mmHg LVOT Vmax:         57.60 cm/s LVOT Vmean:        36.600 cm/s LVOT VTI:          0.100 m LVOT/AV VTI ratio: 0.55  AORTA Ao Root diam: 3.00 cm MITRAL VALVE                TRICUSPID VALVE MV Area (PHT): 3.48 cm     TR Peak grad:   43.6 mmHg MV Decel Time: 218 msec     TR Vmax:        330.00 cm/s MV E velocity: 120.00 cm/s                             SHUNTS                             Systemic VTI:  0.10 m                             Systemic Diam: 2.00 cm Kate Sable MD Electronically signed by Kate Sable MD Signature Date/Time: 07/01/2020/3:47:24 PM    Final      Medications:   . sodium chloride    . albumin human 12.5 g (07/02/20 0926)  . heparin 900 Units/hr (07/02/20 3244)   . allopurinol  50 mg Oral Daily  . atorvastatin  20 mg Oral Daily  . ferrous sulfate  325 mg Oral Q breakfast  . gabapentin  300 mg Oral TID  . metoprolol succinate  50 mg Oral Daily  . midodrine  10 mg Oral TID WC  . polyethylene glycol  17 g Oral Daily  . senna-docusate  1 tablet Oral  BID  . sodium chloride flush  3 mL Intravenous Q12H   sodium chloride, acetaminophen, ipratropium-albuterol, ondansetron (ZOFRAN) IV, sodium chloride flush  Assessment/ Plan:  86 y.o. female with congestive heart failure, hypertension, atrial fibrillation, hyperlipidemia, history of lymphoma admitted on 06/27/2020 for Hypoxia [R09.02] Elevated TSH [R79.89] Acute CHF (congestive heart failure) (HCC) [I50.9] Acute on chronic congestive heart failure, unspecified heart failure type (HCC) [I50.9]   #Acute kidney injury with volume overload  #Chronic kidney disease stage IV.  Baseline creatinine 1.75 from May 08, 2020.  GFR 26-30.  Underlying CKD likely secondary to atherosclerosis, advanced age and hypertension  AKI likely secondary to cardiorenal syndrome Lab Results  Component Value Date   CREATININE 3.86 (H) 07/02/2020   CREATININE 3.81 (H) 07/01/2020   CREATININE 3.67 (H) 06/30/2020  Will continue monitoring labs closely  #Hypotension Blood pressure readings  within acceptable range         LOS: [place] 9/30/20212:25 PM  [place], [place]

## 2020-07-02 NOTE — Progress Notes (Signed)
PT Cancellation Note  Patient Details Name: Kristy Solomon MRN: [id number] DOB: 02/22/1929   Cancelled Treatment:    Reason Eval/Treat Not Completed: Other (comment) Pt lying in bed upon arrival to room. Pt very disheartened and with low energy today. Pt politely refused therapy this afternoon. Pt encouraged to participate however pt still declining secondary to feeling tired. Pt is unclear as to why she is still in the hospital and states it seems like there is something new everyday and she can't keep up with it all. Will attempt another time/date.   [place] 07/02/2020, 3:49 PM

## 2020-07-02 NOTE — Progress Notes (Signed)
OT Cancellation Note  Patient Details Name: Kristy Solomon MRN: [id number] DOB: [birth year]   Cancelled Treatment:    Reason Eval/Treat Not Completed: Patient declined, no reason specified. Pt reported, "No, not today. I am too tired". OT will re-attempt when available.   Darleen Crocker, [place], OTR/L , [place] [phone]  07/02/20, 2:14 PM   07/02/2020, 2:13 PM

## 2020-07-02 NOTE — Consult Note (Signed)
ANTICOAGULATION CONSULT NOTE -   Pharmacy Consult for Heparin infusion Indication: atrial fibrillation  Allergies  Allergen Reactions  . Eliquis [Apixaban]     abd pain    Patient Measurements: Height: 5\' 5"  (165.1 cm) Weight: 102.3 kg (225 lb 9.6 oz) IBW/kg (Calculated) : 57 Heparin Dosing Weight: 80.5 kg  Vital Signs: Temp: 97.7 F (36.5 C) (09/30 0413) Temp Source: Oral (09/30 0413) BP: 121/83 (09/30 0413) Pulse Rate: 45 (09/30 0413)  Labs: Recent Labs    06/29/20 1626 06/30/20 0114 06/30/20 0320 06/30/20 0320 06/30/20 1050 06/30/20 1916 07/01/20 0641 07/01/20 1456 07/02/20 0427  HGB  --   --  11.1*   < >  --   --  10.9*  --  11.0*  HCT  --   --  38.7  --   --   --  37.4  --  36.9  PLT  --   --  157  --   --   --  170  --  181  APTT 37*   < >  --   --  95* 82* 78*  --   --   LABPROT 16.6*  --   --   --   --   --   --   --   --   INR 1.4*  --   --   --   --   --   --   --   --   HEPARINUNFRC 1.57*  --  1.35*   < >  --   --  0.57 0.38 0.35  CREATININE  --   --  3.67*  --   --   --  3.81*  --  3.86*   < > = values in this interval not displayed.    Estimated Creatinine Clearance: 11.7 mL/min (A) (by C-G formula based on SCr of 3.86 mg/dL (H)).   Medical History: Past Medical History:  Diagnosis Date  . (HFpEF) heart failure with preserved ejection fraction ([place])    a. 12/2019 Echo: EF 50-55%, no rwma, mild LVH. Nl RV fxn. Mildly BAE. Mild MR, mild-mod TR.  Marland Kitchen Anemia in neoplastic disease 06/27/2014  . Aortic atherosclerosis ([place])    a. Seen on CT 05/2020.  Marland Kitchen CKD (chronic kidney disease), stage III-IV   . Coronary artery calcification seen on CT scan 05/2020  . COVID-19 virus infection 01/2020  . Hyperlipidemia   . Hypertension   . 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 Permanent atrial fibrillation (HCC)    a. CHA2DS2VASc = 6-->eliquis.    Medications:  Rivaroxaban 15 mg daily  Assessment: 86 yo female admitted with acute on  chronic HFpEF with PMH significant for chronic diastolic CHF, CKD stage IV, HTN, and Afib for which the patient takes rivaroxaban. Patient did not tolerate Eliquis in the past (stomach discomfort/abdominal pain). Patient not an ideal candidate for Coumadin given need for frequent INR checsk and pt does not have good history of compliance. Pharmacy has been consulted for switching patient from rivaroxaban to IV heparin in light of CrCl 13.59mL/min. Last dose of rivaroxaban was 9/26 at 1630.  Baseline aPTT, PT/INR and anti-Xa levels pending. Baseline Hgb ~11; Plt 86 on 9/19 and have trended up to 170.   0928 @0114  aPTT 118; rate decreased to 900 units/hr 0928 @1050  aPTT 95 0928 @1916  aPTT 82 0929 @0641  aPTT 78; HL 0.57 therapeutic x 1 0929 @1523  HL 0.38 therapeutic x 2 0930 @ 0427 HL 0.35, therapeutic x 3  Goal of Therapy:  Heparin level 0.3-0.7 units/ml once aPTT and anti-Xa levels being to correlate Monitor platelets by anticoagulation protocol: Yes   Plan:  --HL is therapeutic x 3; will continue current rate at 900 units/hr --Check HL/CBC with AM labs  Ena Dawley, PharmD Clinical Pharmacist 07/02/2020 6:32 AM

## 2020-07-02 NOTE — Plan of Care (Signed)

## 2020-07-02 NOTE — Care Management Important Message (Signed)
Important Message  Patient Details  Name: Kristy Solomon MRN: [id number] Date of Birth: 02/18/1929   Medicare Important Message Given:  Yes     [name] 07/02/2020, 1:31 PM

## 2020-07-03 ENCOUNTER — Ambulatory Visit: Payer: Medicare HMO | Admitting: Family

## 2020-07-03 DIAGNOSIS — I272 Pulmonary hypertension, unspecified: Secondary | ICD-10-CM

## 2020-07-03 DIAGNOSIS — I4891 Unspecified atrial fibrillation: Secondary | ICD-10-CM | POA: Diagnosis not present

## 2020-07-03 LAB — CBC
HCT: 37.6 % (ref 36.0–46.0)
Hemoglobin: 11.6 g/dL — ABNORMAL LOW (ref 12.0–15.0)
MCH: 22.9 pg — ABNORMAL LOW (ref 26.0–34.0)
MCHC: 30.9 g/dL (ref 30.0–36.0)
MCV: 74.3 fL — ABNORMAL LOW (ref 80.0–100.0)
Platelets: 179 10*3/uL (ref 150–400)
RBC: 5.06 MIL/uL (ref 3.87–5.11)
RDW: 20.5 % — ABNORMAL HIGH (ref 11.5–15.5)
WBC: 5.3 10*3/uL (ref 4.0–10.5)
nRBC: 0 % (ref 0.0–0.2)

## 2020-07-03 LAB — RENAL FUNCTION PANEL
Albumin: 3.1 g/dL — ABNORMAL LOW (ref 3.5–5.0)
Anion gap: 10 (ref 5–15)
BUN: 82 mg/dL — ABNORMAL HIGH (ref 8–23)
CO2: 29 mmol/L (ref 22–32)
Calcium: 8.9 mg/dL (ref 8.9–10.3)
Chloride: 99 mmol/L (ref 98–111)
Creatinine, Ser: 4.04 mg/dL — ABNORMAL HIGH (ref 0.44–1.00)
GFR calc Af Amer: 11 mL/min — ABNORMAL LOW (ref >60)
GFR calc non Af Amer: 9 mL/min — ABNORMAL LOW (ref >60)
Glucose, Bld: 96 mg/dL (ref 70–99)
Phosphorus: 5.7 mg/dL — ABNORMAL HIGH (ref 2.5–4.6)
Potassium: 4.3 mmol/L (ref 3.5–5.1)
Sodium: 138 mmol/L (ref 135–145)

## 2020-07-03 LAB — HEPARIN LEVEL (UNFRACTIONATED): Heparin Unfractionated: 0.21 IU/mL — ABNORMAL LOW (ref 0.30–0.70)

## 2020-07-03 MED ORDER — APIXABAN 2.5 MG PO TABS
2.5000 mg | ORAL_TABLET | Freq: Two times a day (BID) | ORAL | Status: DC
  Administered 2020-07-03 – 2020-07-07 (×9): 2.5 mg via ORAL
  Filled 2020-07-03 (×9): qty 1

## 2020-07-03 MED ORDER — HEPARIN BOLUS VIA INFUSION
1200.0000 [IU] | Freq: Once | INTRAVENOUS | Status: AC
  Administered 2020-07-03: 1200 [IU] via INTRAVENOUS
  Filled 2020-07-03: qty 1200

## 2020-07-03 NOTE — Progress Notes (Signed)
Progress Note  Patient Name: Kristy Solomon Date of Encounter: 07/03/2020  Primary Cardiologist: Agbor-Etang  Subjective   No chest pain, dyspnea, palpitations.  Does not like the food.  ReDs vest normal 9/30.   Inpatient Medications    Scheduled Meds: . allopurinol  50 mg Oral Daily  . apixaban  2.5 mg Oral BID  . atorvastatin  20 mg Oral Daily  . ferrous sulfate  325 mg Oral Q breakfast  . gabapentin  300 mg Oral TID  . metoprolol succinate  50 mg Oral Daily  . midodrine  10 mg Oral TID WC  . polyethylene glycol  17 g Oral Daily  . senna-docusate  1 tablet Oral BID  . sodium chloride flush  3 mL Intravenous Q12H   Continuous Infusions: . sodium chloride    . albumin human 12.5 g (07/03/20 1106)   PRN Meds: sodium chloride, acetaminophen, ipratropium-albuterol, ondansetron (ZOFRAN) IV, sodium chloride flush   Vital Signs    Vitals:   07/02/20 1823 07/02/20 2039 07/03/20 0506 07/03/20 0829  BP: (!) 143/67 138/77 (!) 123/96 92/75  Pulse: 93 92 81 80  Resp:  18  16  Temp:  99.2 F (37.3 C) 99.3 F (37.4 C) 97.7 F (36.5 C)  TempSrc:  Oral Oral Oral  SpO2:  99% 100% 100%  Weight:   100.2 kg   Height:        Intake/Output Summary (Last 24 hours) at 07/03/2020 1118 Last data filed at 07/03/2020 0508 Gross per 24 hour  Intake 421.68 ml  Output 950 ml  Net -528.32 ml   Filed Weights   07/01/20 0531 07/02/20 0413 07/03/20 0506  Weight: 100.5 kg 102.3 kg 100.2 kg    Telemetry    A. fib, 90s to low 100s bpm- Personally Reviewed  ECG    No new tracings - Personally Reviewed  Physical Exam   GEN: No acute distress.   Neck: No JVD. Cardiac:  Irregularly irregular, no murmurs, rubs, or gallops.  Respiratory: Clear to auscultation bilaterally.  GI: Soft, nontender, non-distended.   MS:  1+ bilateral posterior thigh, hip, and flank edema edema; No deformity. Neuro:  Alert and oriented x 3; Nonfocal.  Psych: Normal affect.  Labs     Chemistry Recent Labs  Lab 07/01/20 0641 07/02/20 0427 07/03/20 0456  NA 137 140 138  K 4.4 4.3 4.3  CL 99 100 99  CO2 28 27 29   GLUCOSE 107* 96 96  BUN 80* 80* 82*  CREATININE 3.81* 3.86* 4.04*  CALCIUM 8.7* 8.8* 8.9  ALBUMIN 2.9* 2.9* 3.1*  GFRNONAA 10* 10* 9*  GFRAA 11* 11* 11*  ANIONGAP 10 13 10      Hematology Recent Labs  Lab 07/01/20 0641 07/02/20 0427 07/03/20 0456  WBC 5.0 4.2 5.3  RBC 4.93 4.91 5.06  HGB 10.9* 11.0* 11.6*  HCT 37.4 36.9 37.6  MCV 75.9* 75.2* 74.3*  MCH 22.1* 22.4* 22.9*  MCHC 29.1* 29.8* 30.9  RDW 20.3* 19.9* 20.5*  PLT 170 181 179    Cardiac EnzymesNo results for input(s): TROPONINI in the last 168 hours. No results for input(s): TROPIPOC in the last 168 hours.   BNPNo results for input(s): BNP, PROBNP in the last 168 hours.   DDimer No results for input(s): DDIMER in the last 168 hours.   Radiology    DG Chest 1 View  Result Date: 06/28/2020 IMPRESSION: Improved aeration particularly in the right lung base. Persistent opacity in the left base is  seen.   Cardiac Studies   2D echo 12/2019 1. Left ventricular ejection fraction, by estimation, is 50 to 55%. The  left ventricle has low normal function. The left ventricle has no regional  wall motion abnormalities. There is mild left ventricular hypertrophy.  Left ventricular diastolic  parameters are indeterminate.  2. Right ventricular systolic function is normal. The right ventricular  size is normal. Tricuspid regurgitation signal is inadequate for assessing  PA pressure.  3. Left atrial size was mildly dilated.  4. Right atrial size was mildly dilated.  5. The mitral valve is abnormal. Mild mitral valve regurgitation. No  evidence of mitral stenosis.  6. Tricuspid valve regurgitation is mild to moderate.  7. The aortic valve is tricuspid. Aortic valve regurgitation is not  visualized. No aortic stenosis is present.  8. The inferior vena cava is dilated in size  with <50% respiratory  variability, suggesting right atrial pressure of 15 mmHg. _____________   2D echo 07/01/2020  1. Left ventricular ejection fraction, by estimation, is 20 to 25%. The  left ventricle has severely decreased function. The left ventricle  demonstrates global hypokinesis. Left ventricular diastolic parameters are  indeterminate.  2. Right ventricular systolic function is mildly reduced. The right  ventricular size is normal.  3. Left atrial size was severely dilated.  4. Right atrial size was severely dilated.  5. Moderate pleural effusion in the left lateral region.  6. The mitral valve is normal in structure. Mild to moderate mitral valve  regurgitation.  7. Tricuspid valve regurgitation is moderate.  8. The aortic valve is tricuspid. Aortic valve regurgitation is not  visualized.   Patient Profile     86 y.o. female with history of permanent atrial fibrillation, HFpEF, CKD stage IV, hypertension, lymphoma, and anemia of chronic disease who presented due to shortness of breath and volume overload in the setting of medication noncompliance.  Renal fxn has worsened w/ diuresis this admission.  EF now 20-25%.  Assessment & Plan    1. Acute on HFrEF/pulmonary hypertension:   -Prior h/o HFpEF, however, echo this admission w/ EF 20-25%.  Admitted 9/17 after several months of medication noncompliance and massive volume overload.  Course complicated by intermittent hypotension requiring initiation of midodrine, along with worsening renal failure.  Creat now 4.04.  Minus 288 mL overnight and 6.4 L.  Albumin 3.1.  Her lower extremity swelling is out of proportion to her fluid status.  Suspect low right-sided pressures and elevated right-sided pressures.  Cannot exclude some degree of third spacing with hypoalbuminemia.  With advanced age and unclear coronary status, she is not an ideal inotrope candidate, but may need to consider a short course of low-dose milrinone.   Will continue beta blocker as HRs prev out of control off of therapy.  She is not a candidate for digoxin.  She remains off diuretic with worsening renal function.  2.  Acute on chronic renal failure:   -BUN/SCr continued to decline.  Nephrology following.  Lasix drip has been discontinued.  Cannot exclude cardiorenal syndrome in the setting of low output CHF.  .   3.  Permanent Afib:  -Ventricular rates are reasonably rate controlled on metoprolol.    She was started back on renally dosed Eliquis this morning.    There are some concerns regarding INR follow-up with regard to warfarin.  However, she may require warfarin as outpatient if renal her function doesn't improve.   4.  Elevated HsTroponin/Demand ischemia:  -Mild elevation with flat  trend.  No chest pain this admission.  No prior ischemic evaluation, but coronary artery calcium noted on prior CT.  EF now depressed at 20-25%.  Unfortunately, worsening renal failure makes her a poor candidate for ischemic evaluation at this time.  Cont beta blocker/statin.  Seen by palliative care.  Follow up as an outpatient.   5.  Hypoalbuminemia:  Albumin 3.1 this morning. Receiving IV albumin.  Per IM.  6.  Microcytic anemia:  Stable.  For questions or updates, please contact [place] Please consult [email or web address] for contact info under Cardiology/STEMI.    Signed, Christell Faith, PA-C [place] Pager: [phone] 07/03/2020, 11:18 AM

## 2020-07-03 NOTE — Consult Note (Signed)
ANTICOAGULATION CONSULT NOTE -   Pharmacy Consult for Heparin infusion Indication: atrial fibrillation  Allergies  Allergen Reactions  . Eliquis [Apixaban]     abd pain    Patient Measurements: Height: 5\' 5"  (165.1 cm) Weight: 100.2 kg (221 lb) IBW/kg (Calculated) : 57 Heparin Dosing Weight: 80.5 kg  Vital Signs: Temp: 99.3 F (37.4 C) (10/01 0506) Temp Source: Oral (10/01 0506) BP: 123/96 (10/01 0506) Pulse Rate: 81 (10/01 0506)  Labs: Recent Labs    06/30/20 1050 06/30/20 1916 07/01/20 0641 07/01/20 0641 07/01/20 1456 07/02/20 0427 07/03/20 0456  HGB  --   --  10.9*   < >  --  11.0* 11.6*  HCT  --   --  37.4  --   --  36.9 37.6  PLT  --   --  170  --   --  181 179  APTT 95* 82* 78*  --   --   --   --   HEPARINUNFRC  --   --  0.57   < > 0.38 0.35 0.21*  CREATININE  --   --  3.81*  --   --  3.86* 4.04*   < > = values in this interval not displayed.    Estimated Creatinine Clearance: 11.1 mL/min (A) (by C-G formula based on SCr of 4.04 mg/dL (H)).   Medical History: Past Medical History:  Diagnosis Date  . (HFpEF) heart failure with preserved ejection fraction ([place])    a. 12/2019 Echo: EF 50-55%, no rwma, mild LVH. Nl RV fxn. Mildly BAE. Mild MR, mild-mod TR.  Marland Kitchen Anemia in neoplastic disease 06/27/2014  . Aortic atherosclerosis ([place])    a. Seen on CT 05/2020.  Marland Kitchen CKD (chronic kidney disease), stage III-IV   . Coronary artery calcification seen on CT scan 05/2020  . COVID-19 virus infection 01/2020  . Hyperlipidemia   . Hypertension   . 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 Permanent atrial fibrillation (HCC)    a. CHA2DS2VASc = 6-->eliquis.    Medications:  Rivaroxaban 15 mg daily  Assessment: 86 yo female admitted with acute on chronic HFpEF with PMH significant for chronic diastolic CHF, CKD stage IV, HTN, and Afib for which the patient takes rivaroxaban. Patient did not tolerate Eliquis in the past (stomach discomfort/abdominal  pain). Patient not an ideal candidate for Coumadin given need for frequent INR checsk and pt does not have good history of compliance. Pharmacy has been consulted for switching patient from rivaroxaban to IV heparin in light of CrCl 13.88mL/min. Last dose of rivaroxaban was 9/26 at 1630.  Baseline aPTT, PT/INR and anti-Xa levels pending. Baseline Hgb ~11; Plt 86 on 9/19 and have trended up to 170.   0928 @0114  aPTT 118; rate decreased to 900 units/hr 0928 @1050  aPTT 95 0928 @1916  aPTT 82 0929 @0641  aPTT 78; HL 0.57 therapeutic x 1 0929 @1523  HL 0.38 therapeutic x 2 0930 @ 0427 HL 0.35, therapeutic x 3 1001 @ 0456 HL 0.21  Goal of Therapy:  Heparin level 0.3-0.7 units/ml once aPTT and anti-Xa levels being to correlate Monitor platelets by anticoagulation protocol: Yes   Plan:  --HL is subtherapeutic; will bolus with 1200 units and increase rate to 1050 units/hr --Check HL in 8 hours --CBC with AM labs  Paulina Fusi, PharmD, BCPS 07/03/2020 6:43 AM

## 2020-07-03 NOTE — Progress Notes (Addendum)
Progress Note    Kristy Solomon  GYB:[id number] DOB: [birth year]  DOA: 07/01/2020 PCP: Jodi Marble, MD      Brief Narrative:    Medical records reviewed and are as summarized below:  Kristy Solomon is a 86 y.o. female       Assessment/Plan:   Principal Problem:   Acute on chronic combined systolic (congestive) and diastolic (congestive) heart failure (HCC) Active Problems:   Waldenstrom macroglobulinemia (HCC)   Thrombocytopenia (HCC)   HTN (hypertension)   GERD (gastroesophageal reflux disease)   CKD (chronic kidney disease), stage IV (HCC)   Atrial fibrillation with rapid ventricular response (HCC)   Non compliance w medication regimen   Permanent atrial fibrillation ([place])  AKI on CKD stage IV  Body mass index is 36.78 kg/m.    PLAN  2D echo done on 07/01/2020 showed EF of 20 to 25% (compared to 50 to 55% on echo done in March 2021).  IV heparin has been switched to Eliquis for A. fib and stroke prophylaxis.  IV Lasix has been discontinued because of worsening kidney function.  Nephrologist feels she will need dialysis at this point to manage worsening CKD and fluid management for CHF.  However, she is not a good candidate for dialysis.  Continue ibrutinib for Waldenstrom's macroglobulinemia.  The cost of this medicine has been a barrier to discharge.  The patient said she is willing to discontinue this medicine so she can get into rehab.  Cardiologist suggested that comfort care should be considered as there is not much that could be offered.  Case was discussed with Aniceto Boss, NP, from palliative care.  Patient is not ready for comfort measures or hospice at this time.  Plan of care was also discussed with her daughter, [name].  She said that if her mother wanted to stop taking ibrutinib and she is also okay with that plan so that her mother could go to a rehab facility.  Hopefully this will facilitate discharge to SNF.  Diet Order             Diet 2 gram sodium Room service appropriate? Yes; Fluid consistency: Thin; Fluid restriction: 1200 mL Fluid  Diet effective now                    Consultants:  Nephrologist  Cardiologist  Procedures:  None    Medications:   . allopurinol  50 mg Oral Daily  . apixaban  2.5 mg Oral BID  . atorvastatin  20 mg Oral Daily  . ferrous sulfate  325 mg Oral Q breakfast  . gabapentin  300 mg Oral TID  . metoprolol succinate  50 mg Oral Daily  . midodrine  10 mg Oral TID WC  . polyethylene glycol  17 g Oral Daily  . senna-docusate  1 tablet Oral BID  . sodium chloride flush  3 mL Intravenous Q12H   Continuous Infusions: . sodium chloride    . albumin human 12.5 g (07/03/20 1106)     Anti-infectives (From admission, onward)   None             Family Communication/Anticipated D/C date and plan/Code Status   DVT prophylaxis: apixaban (ELIQUIS) tablet 2.5 mg Start: 07/03/20 1000 Place TED hose Start: 07/01/20 1615 Place TED hose Start: 06/25/20 1534     Code Status: Full Code  Family Communication: Plan discussed with patient Disposition Plan:    Status is: Inpatient  Remains inpatient appropriate because:IV  treatments appropriate due to intensity of illness or inability to take PO and Inpatient level of care appropriate due to severity of illness   Dispo: The patient is from: Home              Anticipated d/c is to: SNF              Anticipated d/c date is: 2 days              Patient currently is not medically stable to d/c.           Subjective:   Interval events noted.  No shortness of breath or chest pain.  She still has bilateral leg swelling.   Objective:    Vitals:   07/03/20 0829 07/03/20 1208 07/03/20 1507 07/03/20 1514  BP: 92/75 120/67  128/74  Pulse: 80 85 88 (!) 107  Resp: 16 17  16   Temp: 97.7 F (36.5 C) 98.2 F (36.8 C)  99.2 F (37.3 C)  TempSrc: Oral Oral  Oral  SpO2: 100% 100% (!) 83% 99%  Weight:        Height:       No data found.   Intake/Output Summary (Last 24 hours) at 07/03/2020 1725 Last data filed at 07/03/2020 1350 Gross per 24 hour  Intake 661.68 ml  Output 350 ml  Net 311.68 ml   Filed Weights   07/01/20 0531 07/02/20 0413 07/03/20 0506  Weight: 100.5 kg 102.3 kg 100.2 kg    Exam:  GEN: NAD SKIN: No rash EYES: EOMI ENT: MMM CV: RRR PULM: CTA B ABD: soft, obese, NT, +BS CNS: AAO x 3, non focal EXT: B/l leg edema, no tenderness    Data Reviewed:   I have personally reviewed following labs and imaging studies:  Labs: Labs show the following:   Basic Metabolic Panel: Recent Labs  Lab 06/29/20 0455 06/29/20 0455 06/30/20 0320 06/30/20 0320 07/01/20 0641 07/01/20 0641 07/02/20 0427 07/03/20 0456  NA 141  --  138  --  137  --  140 138  K 4.6   < > 4.5   < > 4.4   < > 4.3 4.3  CL 105  --  102  --  99  --  100 99  CO2 27  --  27  --  28  --  27 29  GLUCOSE 102*  --  109*  --  107*  --  96 96  BUN 68*  --  74*  --  80*  --  80* 82*  CREATININE 3.30*  --  3.67*  --  3.81*  --  3.86* 4.04*  CALCIUM 8.7*  --  8.6*  --  8.7*  --  8.8* 8.9  PHOS 4.4  --  5.3*  --  4.7*  --  4.7* 5.7*   < > = values in this interval not displayed.   GFR Estimated Creatinine Clearance: 11.1 mL/min (A) (by C-G formula based on SCr of 4.04 mg/dL (H)). Liver Function Tests: Recent Labs  Lab 06/29/20 0455 06/30/20 0320 07/01/20 0641 07/02/20 0427 07/03/20 0456  ALBUMIN 2.7* 2.9* 2.9* 2.9* 3.1*   No results for input(s): LIPASE, AMYLASE in the last 168 hours. No results for input(s): AMMONIA in the last 168 hours. Coagulation profile Recent Labs  Lab 06/29/20 1626  INR 1.4*    CBC: Recent Labs  Lab 06/29/20 0455 06/30/20 0320 07/01/20 0641 07/02/20 0427 07/03/20 0456  WBC 5.8 4.5 5.0 4.2 5.3  HGB  11.3* 11.1* 10.9* 11.0* 11.6*  HCT 39.4 38.7 37.4 36.9 37.6  MCV 77.6* 76.2* 75.9* 75.2* 74.3*  PLT 155 157 170 181 179   Cardiac Enzymes: No results for  input(s): CKTOTAL, CKMB, CKMBINDEX, TROPONINI in the last 168 hours. BNP (last 3 results) No results for input(s): PROBNP in the last 8760 hours. CBG: No results for input(s): GLUCAP in the last 168 hours. D-Dimer: No results for input(s): DDIMER in the last 72 hours. Hgb A1c: No results for input(s): HGBA1C in the last 72 hours. Lipid Profile: No results for input(s): CHOL, HDL, LDLCALC, TRIG, CHOLHDL, LDLDIRECT in the last 72 hours. Thyroid function studies: No results for input(s): TSH, T4TOTAL, T3FREE, THYROIDAB in the last 72 hours.  Invalid input(s): FREET3 Anemia work up: No results for input(s): VITAMINB12, FOLATE, FERRITIN, TIBC, IRON, RETICCTPCT in the last 72 hours. Sepsis Labs: Recent Labs  Lab 06/30/20 0320 07/01/20 0641 07/02/20 0427 07/03/20 0456  WBC 4.5 5.0 4.2 5.3    Microbiology No results found for this or any previous visit (from the past 240 hour(s)).  Procedures and diagnostic studies:  No results found.             LOS: 14 days   [place] Copywriter, advertising on [email or web address]. If 7PM-7AM, please contact night-coverage at [email or web address]     07/03/2020, 5:25 PM

## 2020-07-03 NOTE — Progress Notes (Signed)
Mobility Specialist - Progress Note   07/03/20 1249  Mobility  Activity Transferred to/from [facility]  Range of Motion/Exercises Right leg;Left leg (Ankle pumps, quad set, slr, hip iso, hip add/abd, march)  Level of Assistance Contact guard assist, steadying assist  Assistive Device Front wheel walker  Distance Ambulated (ft) 6 ft  Mobility Response Tolerated well  Mobility performed by Mobility specialist  $Mobility charge 1 Mobility    Pre-mobility: 68 HR, 126/61 BP, 100% SpO2 Post-mobility: 83 HR, 100% SpO2   Pt was sitting in recliner upon arrival utilizing 2L [place] O2. Pt agreed to session. Pt initially declined OOB activity, but was able to perform chair exercises: ankle pumps (12x/leg), straight leg raises, seated march, hip isometrics, hip add/abd (10x/leg), and quad sets (5x/leg). Pt was initially discouraged about performance today, but with encouragement from mobility specialist, pt was able to exceed beyond her own expectations for this session. Pt was able to perform STS with minA. Pt ambulated 3' in rotating motion to [facility] with CGA and RW (6' total to/from [facility]). Verbal cues were needed to keep RW close to body. Upon getting to [facility], pt had a loose BM. Mobility assisted in hygiene. Overall, pt tolerated session well. Pt was left in recliner with all needs in reach.   Kathee Delton Mobility Specialist 07/03/20, 12:58 PM

## 2020-07-03 NOTE — Progress Notes (Signed)
RN updates pts daughter which knows the account password. Daughters states that someone called from the hospital and she was unable to answer the phone and they did not call back. RN searches patient notes for who may have called.Unable to discover who called daughter.  I will continue to assess.

## 2020-07-03 NOTE — Progress Notes (Signed)
Occupational Therapy Treatment Patient Details Name: Kristy Solomon MRN: [id number] DOB: December 06, 1928 Today's Date: 07/03/2020    History of present illness Kristy Solomon is an 86 y/o female who was admitted for worsening SOB with an increase in O2 to 3L due to symptoms. In ED, pt was tachycardic and in rapid A-fib w/ HR to 170. PMH includes chronic respiratory failure on 2L O2 continuous, chrinic diastolic dysfunction, A-fib on chronic anticoagulation therapy, HTN with complications of CKD III-IV, covid-19 virus infection April 2021, and history of malignant lymphoma.   OT comments  Pt seated in recliner chair upon entering the room. Pt agreeable to OT intervention with coaxing but refusing to transfer or stand this session. OT noted pt to have essential tremor in R UE at rest and with activity. Pt having difficulty following commands and perseveration noted with self care tasks this session. Pt overshooting when reaching to midline to obtain comb from therapist hand. OT discussed concerns with RN this session. Pt remained in recliner chair at end of session with all needs within reach. Chair alarm activated.   Follow Up Recommendations  SNF    Equipment Recommendations  Other (comment) (defer to next venue of care)       Precautions / Restrictions Precautions Precautions: Fall Restrictions Weight Bearing Restrictions: No       Mobility  Transfers      General transfer comment: pt seated in recliner chair and declines transfer and sit <>stand        ADL either performed or assessed with clinical judgement   ADL Overall ADL's : Needs assistance/impaired     Grooming: Wash/dry hands;Wash/dry face;Moderate assistance          Vision   Vision Assessment?: Vision impaired- to be further tested in functional context          Cognition Arousal/Alertness: Awake/alert Behavior During Therapy: WFL for tasks assessed/performed Overall Cognitive Status: Impaired/Different  from baseline Area of Impairment: Orientation;Attention;Following commands;Safety/judgement;Awareness    Orientation Level: Person;Place Current Attention Level: Focused   Following Commands: Follows one step commands with increased time;Follows one step commands inconsistently Safety/Judgement: Decreased awareness of safety;Decreased awareness of deficits Awareness: Intellectual   General Comments: Pt overshooting reaching at midline to obtain needed items                   Pertinent Vitals/ Pain       Pain Assessment: No/denies pain         Frequency  Min 1X/week        Progress Toward Goals  OT Goals(current goals can now be found in the care plan section)  Progress towards OT goals: OT to reassess next treatment  Acute Rehab OT Goals Patient Stated Goal: to go back to sleep OT Goal Formulation: With patient Time For Goal Achievement: 07/31/2020 Potential to Achieve Goals: Good  Plan Discharge plan needs to be updated       AM-PAC OT "6 Clicks" Daily Activity     Outcome Measure   Help from another person eating meals?: None Help from another person taking care of personal grooming?: A Lot Help from another person toileting, which includes using toliet, bedpan, or urinal?: A Lot Help from another person bathing (including washing, rinsing, drying)?: A Lot Help from another person to put on and taking off regular upper body clothing?: A Little Help from another person to put on and taking off regular lower body clothing?: A Lot 6 Click Score: 15  End of Session Equipment Utilized During Treatment: Gait belt;Rolling walker;Oxygen  OT Visit Diagnosis: Unsteadiness on feet (R26.81);Muscle weakness (generalized) (M62.81)   Activity Tolerance Patient tolerated treatment well   Patient Left in chair;with call bell/phone within reach;with chair alarm set   Nurse Communication Mobility status;Other (comment) (concerns regarding cognition this session)         Time: 9971-8209 OT Time Calculation (min): 25 min  Charges: OT General Charges $OT Visit: 1 Visit OT Treatments $Self Care/Home Management : 23-37 mins  Kristy Crocker, MS, OTR/L , CBIS ascom [phone]  07/03/20, 4:11 PM

## 2020-07-03 NOTE — TOC Progression Note (Signed)
Transition of Care [facility]) - Progression Note    Patient Details  Name: Kristy Solomon MRN: [id number] Date of Birth: 06-21-1929  Transition of Care [facility]) CM/SW [place], RN Phone Number: 07/03/2020, 9:25 AM  Clinical Narrative:     Clarification on if patient is to be receiving chemotherapy medication Imbruvica.  Daughter Daylene Katayama reports she is suppose to be taking medication.  Explained patient needs to be participating with therapy to get accepted to SNF.   Due to cost of chemo medication there are no current facilities willing to accept patient at this time.   Daughter reports patient can not be discharged home, there is no one available to provide care for her.       Barriers to Discharge: Barriers Resolved  Expected Discharge Plan and Services                             DME Agency: [facility]       HH Arranged: RN, PT, OT, Nurse's Aide [place] Agency: [facility] Date [place]: 06/24/20 Time Progress: 1254 Representative spoke with at [place]: [place] ([place]) Interventions    Readmission Risk Interventions Readmission Risk Prevention Plan 06/21/2020  Transportation Screening Complete  PCP or Specialist Appt within 3-5 Days Complete  HRI or [place] Complete  Social Work Consult for [place] Planning/Counseling Complete  Palliative Care Screening Complete  Medication Review Press photographer) Complete  Some recent data might be hidden

## 2020-07-03 NOTE — Progress Notes (Signed)
[place], [place] 07/03/20  Subjective:   Hospital day # 14  Patient is alert,oriented,reports consuming her breakfast, no nausea or vomiting. Reports feeling much better.No SOB, on 2L of supplemental O2 via [place],   Lasix drip was stopped because of increasing creatinine   Renal: 09/30 0701 - 10/01 0700 In: 661.7 [P.O.:480; I.V.:181.7] Out: 950 [Urine:950] Lab Results  Component Value Date   CREATININE 4.04 (H) 07/03/2020   CREATININE 3.86 (H) 07/02/2020   CREATININE 3.81 (H) 07/01/2020     Objective:  Vital signs in last 24 hours:  Temp:  [97.7 F (36.5 C)-99.3 F (37.4 C)] 99.2 F (37.3 C) (10/01 1514) Pulse Rate:  [80-107] 107 (10/01 1514) Resp:  [16-18] 16 (10/01 1514) BP: (92-152)/(67-99) 128/74 (10/01 1514) SpO2:  [83 %-100 %] 99 % (10/01 1514) Weight:  [100.2 kg] 100.2 kg (10/01 0506)  Weight change: -2.087 kg Filed Weights   07/01/20 0531 07/02/20 0413 07/03/20 0506  Weight: 100.5 kg 102.3 kg 100.2 kg    Intake/Output:    Intake/Output Summary (Last 24 hours) at 07/03/2020 1541 Last data filed at 07/03/2020 1350 Gross per 24 hour  Intake 661.68 ml  Output 350 ml  Net 311.68 ml     Physical Exam: General:  In no acute distress  HEENT  Normocephalic,atraumatic  Pulm/lungs  Lungs diminished at the bases  CVS/Heart  irregular  Abdomen:   Soft, obese  Extremities:  1+  Peripheral edema, TED hose in place  Neurologic:  Alert, oriented  Skin:  No acute rashes    Basic Metabolic Panel:  Recent Labs  Lab 06/29/20 0455 06/29/20 0455 06/30/20 0320 06/30/20 0320 07/01/20 0641 07/02/20 0427 07/03/20 0456  NA 141  --  138  --  137 140 138  K 4.6  --  4.5  --  4.4 4.3 4.3  CL 105  --  102  --  99 100 99  CO2 27  --  27  --  28 27 29   GLUCOSE 102*  --  109*  --  107* 96 96  BUN 68*  --  74*  --  80* 80* 82*  CREATININE 3.30*  --  3.67*  --  3.81* 3.86* 4.04*  CALCIUM 8.7*   < > 8.6*   < > 8.7* 8.8* 8.9  PHOS 4.4  --   5.3*  --  4.7* 4.7* 5.7*   < > = values in this interval not displayed.     CBC: Recent Labs  Lab 06/29/20 0455 06/30/20 0320 07/01/20 0641 07/02/20 0427 07/03/20 0456  WBC 5.8 4.5 5.0 4.2 5.3  HGB 11.3* 11.1* 10.9* 11.0* 11.6*  HCT 39.4 38.7 37.4 36.9 37.6  MCV 77.6* 76.2* 75.9* 75.2* 74.3*  PLT 155 157 170 181 179      Lab Results  Component Value Date   HEPBSAG Negative 09/11/2018      Microbiology:  No results found for this or any previous visit (from the past 240 hour(s)).  Coagulation Studies: No results for input(s): LABPROT, INR in the last 72 hours.  Urinalysis: No results for input(s): COLORURINE, LABSPEC, PHURINE, GLUCOSEU, HGBUR, BILIRUBINUR, KETONESUR, PROTEINUR, UROBILINOGEN, NITRITE, LEUKOCYTESUR in the last 72 hours.  Invalid input(s): APPERANCEUR    Imaging: No results found.   Medications:   . sodium chloride    . albumin human 12.5 g (07/03/20 1106)   . allopurinol  50 mg Oral Daily  . apixaban  2.5 mg Oral BID  . atorvastatin  20  mg Oral Daily  . ferrous sulfate  325 mg Oral Q breakfast  . gabapentin  300 mg Oral TID  . metoprolol succinate  50 mg Oral Daily  . midodrine  10 mg Oral TID WC  . polyethylene glycol  17 g Oral Daily  . senna-docusate  1 tablet Oral BID  . sodium chloride flush  3 mL Intravenous Q12H   sodium chloride, acetaminophen, ipratropium-albuterol, ondansetron (ZOFRAN) IV, sodium chloride flush  Assessment/ Plan:  86 y.o. female with congestive heart failure, hypertension, atrial fibrillation, hyperlipidemia, history of lymphoma admitted on 06/17/2020 for Hypoxia [R09.02] Elevated TSH [R79.89] Acute CHF (congestive heart failure) (HCC) [I50.9] Acute on chronic congestive heart failure, unspecified heart failure type (HCC) [I50.9]   #Acute kidney injury with volume overload #Chronic kidney disease stage IV.  Baseline creatinine 1.75 from May 08, 2020.  GFR 26-30.  Underlying CKD likely secondary to  atherosclerosis, advanced age and hypertension  AKI likely secondary to cardiorenal syndrome Lab Results  Component Value Date   CREATININE 4.04 (H) 07/03/2020   CREATININE 3.86 (H) 07/02/2020   CREATININE 3.81 (H) 07/01/2020  Serum creatinine remains critically high at 4.0, GFR 9-11 Lasix drip had to be discontinued due to increase in creatinine.  We may have reached the limit of diuresis with IV medications. Brought up the subject of dialysis with patient.  She understands she is 86 year old.  She had good questions about what her options are.  Discussed that we may have reached the limits of diuresis with IV medications.  Today she expressed her wishes of accepting dialysis with the understanding that she may not do well, given she has severe CHF, pulmonary hypertension and chronic low blood pressure. However, I think, we should continue to discuss this with palliative care and family and proceed with extreme caution as she may do poorly on dialysis.  #Hypotension, chronic Blood pressure readings  within acceptable range         LOS: [place] 10/1/20213:41 [place], [place]

## 2020-07-03 NOTE — Progress Notes (Signed)
Palliative: Kristy Solomon is resting quietly in the [place] chair in her room.  She is alert and oriented, able to make her basic needs known.  She does have memory loss, but is able to make her basic needs known.  There is no family at bedside at this time.  Her breakfast tray is in front of her and she is preparing her meal.  As we talked she is able to feed herself.  We talked in detail about her fluid balance and kidney function.  At this point Kristy Solomon wants to continue to do everything possible to change her circumstance, improve her health.  She is agreeable to short-term rehab, with the understanding that she will need to stop her oncology medications during that time.  I share my concern about her ability to care for self at home.  She tells me that she does not believe that she can manage herself at home alone, but feels that her children cannot move in with her.  She shares that her daughter, Kristy Solomon, was able to help when the pandemic first started because she was laid off.  She tells me that her other children live too far away to help.  We talked about living in a nursing home, this may prolong her life, versus staying at home and life being shorter.  Kristy Solomon states that this would be a difficult choice, she really would prefer to stay in her own home.  We talk in detail about the benefits of at home "treat the treatable" hospice care.  Initially she states that she would accept this help, with a goal to help keep her in her own home.  She tells me that her sister had hospice care in her home, and when time became short she went to a hospice facility.  As we continue to speak about hospice care, Kristy Solomon tells me that she would not accept at home hospice care at this point.  Call to daughter, Kristy Solomon.  She tells me that she is on the phone with someone else, handing lying business for her mother.  She requests return phone call.  Unable to reach.   Detail conference with attending,  bedside nursing staff, transition of care team related to patient condition, needs, goals of care, disposition options, palliative referral.  Plan:   Kristy Solomon is agreeable to short-term rehab.  She, at this point, is not agreeable to at home hospice care.  38 minutes  [name], NP Palliative medicine team Team phone [phone] Greater than 50% of this time was spent counseling and coordinating care related to the above assessment and plan.

## 2020-07-03 DEATH — deceased

## 2020-07-04 LAB — RENAL FUNCTION PANEL
Albumin: 2.9 g/dL — ABNORMAL LOW (ref 3.5–5.0)
Anion gap: 10 (ref 5–15)
BUN: 90 mg/dL — ABNORMAL HIGH (ref 8–23)
CO2: 28 mmol/L (ref 22–32)
Calcium: 9 mg/dL (ref 8.9–10.3)
Chloride: 99 mmol/L (ref 98–111)
Creatinine, Ser: 4.43 mg/dL — ABNORMAL HIGH (ref 0.44–1.00)
GFR calc Af Amer: 10 mL/min — ABNORMAL LOW (ref >60)
GFR calc non Af Amer: 8 mL/min — ABNORMAL LOW (ref >60)
Glucose, Bld: 97 mg/dL (ref 70–99)
Phosphorus: 5.4 mg/dL — ABNORMAL HIGH (ref 2.5–4.6)
Potassium: 4.4 mmol/L (ref 3.5–5.1)
Sodium: 137 mmol/L (ref 135–145)

## 2020-07-04 NOTE — Progress Notes (Signed)
[place], [place] 07/04/20  Subjective:   Hospital day # 15  Renal function appears to be a bit worse. Creatinine up to 4.4.   Renal: 10/01 0701 - 10/02 0700 In: 240 [P.O.:240] Out: -  Lab Results  Component Value Date   CREATININE 4.43 (H) 07/04/2020   CREATININE 4.04 (H) 07/03/2020   CREATININE 3.86 (H) 07/02/2020     Objective:  Vital signs in last 24 hours:  Temp:  [97.8 F (36.6 C)-99.2 F (37.3 C)] 97.8 F (36.6 C) (10/02 1159) Pulse Rate:  [84-107] 84 (10/02 1159) Resp:  [16-18] 18 (10/02 1159) BP: (105-148)/(52-124) 148/124 (10/02 1159) SpO2:  [83 %-99 %] 89 % (10/02 1159) Weight:  [99.9 kg] 99.9 kg (10/02 0500)  Weight change: -0.345 kg Filed Weights   07/02/20 0413 07/03/20 0506 07/04/20 0500  Weight: 102.3 kg 100.2 kg 99.9 kg    Intake/Output:   No intake or output data in the 24 hours ending 07/04/20 1445   Physical Exam: General:  In no acute distress  HEENT  Normocephalic,atraumatic  Pulm/lungs  Lungs diminished at the bases  CVS/Heart  irregular  Abdomen:   Soft, obese  Extremities:  1+  Peripheral edema  Neurologic:  Alert, oriented  Skin:  No acute rashes    Basic Metabolic Panel:  Recent Labs  Lab 06/30/20 0320 06/30/20 0320 07/01/20 0641 07/01/20 0641 07/02/20 0427 07/03/20 0456 07/04/20 0728  NA 138  --  137  --  140 138 137  K 4.5  --  4.4  --  4.3 4.3 4.4  CL 102  --  99  --  100 99 99  CO2 27  --  28  --  27 29 28   GLUCOSE 109*  --  107*  --  96 96 97  BUN 74*  --  80*  --  80* 82* 90*  CREATININE 3.67*  --  3.81*  --  3.86* 4.04* 4.43*  CALCIUM 8.6*   < > 8.7*   < > 8.8* 8.9 9.0  PHOS 5.3*  --  4.7*  --  4.7* 5.7* 5.4*   < > = values in this interval not displayed.     CBC: Recent Labs  Lab 06/29/20 0455 06/30/20 0320 07/01/20 0641 07/02/20 0427 07/03/20 0456  WBC 5.8 4.5 5.0 4.2 5.3  HGB 11.3* 11.1* 10.9* 11.0* 11.6*  HCT 39.4 38.7 37.4 36.9 37.6  MCV 77.6* 76.2* 75.9*  75.2* 74.3*  PLT 155 157 170 181 179      Lab Results  Component Value Date   HEPBSAG Negative 09/11/2018      Microbiology:  No results found for this or any previous visit (from the past 240 hour(s)).  Coagulation Studies: No results for input(s): LABPROT, INR in the last 72 hours.  Urinalysis: No results for input(s): COLORURINE, LABSPEC, PHURINE, GLUCOSEU, HGBUR, BILIRUBINUR, KETONESUR, PROTEINUR, UROBILINOGEN, NITRITE, LEUKOCYTESUR in the last 72 hours.  Invalid input(s): APPERANCEUR    Imaging: No results found.   Medications:   . sodium chloride    . albumin human 12.5 g (07/04/20 0817)   . allopurinol  50 mg Oral Daily  . apixaban  2.5 mg Oral BID  . atorvastatin  20 mg Oral Daily  . ferrous sulfate  325 mg Oral Q breakfast  . gabapentin  300 mg Oral TID  . metoprolol succinate  50 mg Oral Daily  . midodrine  10 mg Oral TID WC  . polyethylene glycol  17 g  Oral Daily  . senna-docusate  1 tablet Oral BID  . sodium chloride flush  3 mL Intravenous Q12H   sodium chloride, acetaminophen, ipratropium-albuterol, ondansetron (ZOFRAN) IV, sodium chloride flush  Assessment/ Plan:  86 y.o. female with congestive heart failure, hypertension, atrial fibrillation, hyperlipidemia, history of lymphoma admitted on 06/18/2020 for Hypoxia [R09.02] Elevated TSH [R79.89] Acute CHF (congestive heart failure) (HCC) [I50.9] Acute on chronic congestive heart failure, unspecified heart failure type (HCC) [I50.9]   #Acute kidney injury with volume overload #Chronic kidney disease stage IV.  Baseline creatinine 1.75 from May 08, 2020.  GFR 26-30.  Underlying CKD likely secondary to atherosclerosis, advanced age and hypertension  AKI likely secondary to cardiorenal syndrome Lab Results  Component Value Date   CREATININE 4.43 (H) 07/04/2020   CREATININE 4.04 (H) 07/03/2020   CREATININE 3.86 (H) 07/02/2020  Serum creatinine remains critically high at 4.0, GFR 9-11 Lasix  drip had to be discontinued due to increase in creatinine.  We may have reached the limit of diuresis with IV medications. Brought up the subject of dialysis with patient.  She understands she is 86 year old.  She had good questions about what her options are.  Discussed that we may have reached the limits of diuresis with IV medications.   -At the moment it appears that patient does not want to pursue renal replacement therapy which appears to be reasonable given her advanced age and comorbidities.  Continue conservative measures for now.  #Hypotension, chronic This would make renal placement therapy quite difficult.  Remains on midodrine.        LOS: 15 Jaton Eilers 10/2/20212:45 PM  [place], [place]

## 2020-07-04 NOTE — Progress Notes (Addendum)
Progress Note    Kristy Solomon  NKN:[id number] DOB: 11-24-1928  DOA: 06/29/2020 PCP: Jodi Marble, MD      Brief Narrative:    Medical records reviewed and are as summarized below:  Kristy Solomon is a 86 y.o. female with PMH chronic respiratory failure (on 2L [place] O2), HFpEF, permanent afib (on Xarelto), HTN, CKD stage IV HLD, history of lymphoma, Waldenstrm's macroglobulinemia, anemia of chronic disease, underlying cognitive impairment probably dementia, who presented to the hospital with worsening SOB. She had been out of her medications at home for approx a couple weeks.  She was found to be volume overloaded and in afib with RVR. Nephrology and cardiology were consulted on admission.  She was admitted to the hospital for acute exacerbation of CHF.  She was treated with IV Lasix.  Unfortunately, she developed AKI on CKD stage IV.  Midodrine was added to help with urine output.  2D echo showed EF estimated at 20 to 25%.  IV Lasix is no longer helping with fluid management and ideally should be she should be on dialysis.  However, she is not a good candidate for dialysis because of advanced age and underlying comorbidities.  She was initially on IV heparin for A. fib but she has been switched to Eliquis.  PT and OT recommended further rehabilitation in a skilled nursing facility.      Assessment/Plan:   Principal Problem:   Acute on chronic combined systolic (congestive) and diastolic (congestive) heart failure (HCC) Active Problems:   Waldenstrom macroglobulinemia (HCC)   Thrombocytopenia (HCC)   HTN (hypertension)   GERD (gastroesophageal reflux disease)   CKD (chronic kidney disease), stage IV (HCC)   Atrial fibrillation with rapid ventricular response (HCC)   Non compliance w medication regimen   Permanent atrial fibrillation (HCC)  AKI on CKD stage IV  Body mass index is 36.65 kg/m.    PLAN  2D echo done on 07/01/2020 showed EF of 20 to 25%  (compared to 50 to 55% on echo done in March 2021).  Continue Eliquis for A. fib and stroke prophylaxis  IV Lasix has been discontinued because of worsening kidney function.  She is not a good candidate for dialysis per nephrologist.  There is no much to offer from cardiology standpoint.  Plan of care was discussed with the patient's daughter, [name], at the bedside.  Diagnoses and prognosis were discussed.  Goals of care were discussed.  It was explained that patient cannot continue with Lasix because of worsening kidney function and she will not be a good candidate for dialysis.  She remains a full code at this time.  [name] was more focused about her worsening memory and cognitive deficits.  She said she did not recognize her when she feels that this morning.  She also said she did not remember that she had already eaten today and thought that she was only admitted to the hospital yesterday.  She said that patient cannot go home like this.  She understands that declining cognition cannot be reversed.  However, she agreed that patient would be best served at the skilled nursing facility.   Diet Order            Diet 2 gram sodium Room service appropriate? Yes; Fluid consistency: Thin; Fluid restriction: 1200 mL Fluid  Diet effective now                    Consultants:  Nephrologist  Cardiologist  Procedures:  None    Medications:   . allopurinol  50 mg Oral Daily  . apixaban  2.5 mg Oral BID  . atorvastatin  20 mg Oral Daily  . ferrous sulfate  325 mg Oral Q breakfast  . gabapentin  300 mg Oral TID  . metoprolol succinate  50 mg Oral Daily  . midodrine  10 mg Oral TID WC  . polyethylene glycol  17 g Oral Daily  . senna-docusate  1 tablet Oral BID  . sodium chloride flush  3 mL Intravenous Q12H   Continuous Infusions: . sodium chloride    . albumin human 12.5 g (07/04/20 0817)     Anti-infectives (From admission, onward)   None             Family  Communication/Anticipated D/C date and plan/Code Status   DVT prophylaxis: apixaban (ELIQUIS) tablet 2.5 mg Start: 07/03/20 1000 Place TED hose Start: 07/01/20 1615 Place TED hose Start: 06/25/20 1534     Code Status: Full Code  Family Communication: Plan discussed with patient Disposition Plan:    Status is: Inpatient  Remains inpatient appropriate because:IV treatments appropriate due to intensity of illness or inability to take PO and Inpatient level of care appropriate due to severity of illness   Dispo: The patient is from: Home              Anticipated d/c is to: SNF              Anticipated d/c date is: 2 days              Patient currently is not medically stable to d/c.           Subjective:   Interval events noted.  She has no complaints but she is very forgetful and does not remember previous discussions.   Objective:    Vitals:   07/03/20 2054 07/04/20 0500 07/04/20 0736 07/04/20 1159  BP: 112/85  (!) 105/52 (!) 148/124  Pulse:   87 84  Resp:   18 18  Temp:   98.5 F (36.9 C) 97.8 F (36.6 C)  TempSrc:   Oral Oral  SpO2: 94%  92% (!) 89%  Weight:  99.9 kg    Height:       No data found.  No intake or output data in the 24 hours ending 07/04/20 1448 Filed Weights   07/02/20 0413 07/03/20 0506 07/04/20 0500  Weight: 102.3 kg 100.2 kg 99.9 kg    Exam:  GEN: NAD SKIN: No rash EYES: EOMI ENT: MMM CV: RRR PULM: CTA B ABD: soft, ND, NT, +BS CNS: AAO x 3 but very forgetful, non focal EXT: B/l leg edema, no tenderness     Data Reviewed:   I have personally reviewed following labs and imaging studies:  Labs: Labs show the following:   Basic Metabolic Panel: Recent Labs  Lab 06/30/20 0320 06/30/20 0320 07/01/20 0641 07/01/20 5809 07/02/20 0427 07/02/20 0427 07/03/20 0456 07/04/20 0728  NA 138  --  137  --  140  --  138 137  K 4.5   < > 4.4   < > 4.3   < > 4.3 4.4  CL 102  --  99  --  100  --  99 99  CO2 27  --  28  --   27  --  29 28  GLUCOSE 109*  --  107*  --  96  --  96 97  BUN  74*  --  80*  --  80*  --  82* 90*  CREATININE 3.67*  --  3.81*  --  3.86*  --  4.04* 4.43*  CALCIUM 8.6*  --  8.7*  --  8.8*  --  8.9 9.0  PHOS 5.3*  --  4.7*  --  4.7*  --  5.7* 5.4*   < > = values in this interval not displayed.   GFR Estimated Creatinine Clearance: 10.1 mL/min (A) (by C-G formula based on SCr of 4.43 mg/dL (H)). Liver Function Tests: Recent Labs  Lab 06/30/20 0320 07/01/20 0641 07/02/20 0427 07/03/20 0456 07/04/20 0728  ALBUMIN 2.9* 2.9* 2.9* 3.1* 2.9*   No results for input(s): LIPASE, AMYLASE in the last 168 hours. No results for input(s): AMMONIA in the last 168 hours. Coagulation profile Recent Labs  Lab 06/29/20 1626  INR 1.4*    CBC: Recent Labs  Lab 06/29/20 0455 06/30/20 0320 07/01/20 0641 07/02/20 0427 07/03/20 0456  WBC 5.8 4.5 5.0 4.2 5.3  HGB 11.3* 11.1* 10.9* 11.0* 11.6*  HCT 39.4 38.7 37.4 36.9 37.6  MCV 77.6* 76.2* 75.9* 75.2* 74.3*  PLT 155 157 170 181 179   Cardiac Enzymes: No results for input(s): CKTOTAL, CKMB, CKMBINDEX, TROPONINI in the last 168 hours. BNP (last 3 results) No results for input(s): PROBNP in the last 8760 hours. CBG: No results for input(s): GLUCAP in the last 168 hours. D-Dimer: No results for input(s): DDIMER in the last 72 hours. Hgb A1c: No results for input(s): HGBA1C in the last 72 hours. Lipid Profile: No results for input(s): CHOL, HDL, LDLCALC, TRIG, CHOLHDL, LDLDIRECT in the last 72 hours. Thyroid function studies: No results for input(s): TSH, T4TOTAL, T3FREE, THYROIDAB in the last 72 hours.  Invalid input(s): FREET3 Anemia work up: No results for input(s): VITAMINB12, FOLATE, FERRITIN, TIBC, IRON, RETICCTPCT in the last 72 hours. Sepsis Labs: Recent Labs  Lab 06/30/20 0320 07/01/20 0641 07/02/20 0427 07/03/20 0456  WBC 4.5 5.0 4.2 5.3    Microbiology No results found for this or any previous visit (from the past 240  hour(s)).  Procedures and diagnostic studies:  No results found.             LOS: 15 days   [place] Copywriter, advertising on [email or web address]. If 7PM-7AM, please contact night-coverage at [email or web address]     07/04/2020, 2:48 PM

## 2020-07-04 NOTE — Progress Notes (Signed)
PT Cancellation Note  Patient Details Name: Kristy Solomon MRN: [id number] DOB: [birth year]   Cancelled Treatment:     PT attempt. Pt unwilling to allow female therapist to remove purewick or apply brief. Becomes very frustrated with any attempt to assist pt. Will have female therapist return next date and acute PT will continue to follow per POC.   Willette Pa 07/04/2020, 4:44 PM

## 2020-07-04 NOTE — Progress Notes (Signed)
Mobility Specialist - Progress Note   07/04/20 1100  Mobility  Activity Transferred:  Bed to chair  Level of Assistance Moderate assist, patient does 50-74%  Assistive Device Front wheel walker  Distance Ambulated (ft) 5 ft  Mobility Response Tolerated well  Mobility performed by Mobility specialist  $Mobility charge 1 Mobility    Pre-mobility: 67 HR, 94% SpO2 Post-mobility: 69 HR, 97% SpO2   Pt was lying in bed upon arrival utilizing room air. Pt agreed to session. Pt denied any pain, dizziness, or fatigue. Pt was modA in all mobility transfers this date. Pt was able to stand to RW, but had a incontinent episode (urinary/BM) while standing. Mobility assisted with hygiene. Pt needed extra time and verbal cuing to keep RW close to body for easy and safe ambulation. Pt needed proper gait cuing during ambulation. Overall, pt tolerated session well. Pt was left in recliner with all needs in reach and alarm set. Pt attempted to finish breakfast. Nurse was notified.    Kathee Delton Mobility Specialist 07/04/20, 11:29 AM

## 2020-07-04 NOTE — Progress Notes (Signed)
Progress Note  Patient Name: Kristy Solomon Date of Encounter: 07/04/2020  Primary Cardiologist: Agbor-Etang  Subjective   No chest pain, dyspnea, palpitations. Renal function continues to decline. ReDs vest normal 9/30.   Inpatient Medications    Scheduled Meds: . allopurinol  50 mg Oral Daily  . apixaban  2.5 mg Oral BID  . atorvastatin  20 mg Oral Daily  . ferrous sulfate  325 mg Oral Q breakfast  . gabapentin  300 mg Oral TID  . metoprolol succinate  50 mg Oral Daily  . midodrine  10 mg Oral TID WC  . polyethylene glycol  17 g Oral Daily  . senna-docusate  1 tablet Oral BID  . sodium chloride flush  3 mL Intravenous Q12H   Continuous Infusions: . sodium chloride    . albumin human 12.5 g (07/04/20 0817)   PRN Meds: sodium chloride, acetaminophen, ipratropium-albuterol, ondansetron (ZOFRAN) IV, sodium chloride flush   Vital Signs    Vitals:   07/03/20 2001 07/03/20 2054 07/04/20 0500 07/04/20 0736  BP: (!) 131/108 112/85  (!) 105/52  Pulse: (!) 102   87  Resp: 18   18  Temp: 98.6 F (37 C)   98.5 F (36.9 C)  TempSrc: Oral   Oral  SpO2: 94% 94%  92%  Weight:   99.9 kg   Height:        Intake/Output Summary (Last 24 hours) at 07/04/2020 0855 Last data filed at 07/03/2020 1350 Gross per 24 hour  Intake 240 ml  Output --  Net 240 ml   Filed Weights   07/02/20 0413 07/03/20 0506 07/04/20 0500  Weight: 102.3 kg 100.2 kg 99.9 kg    Telemetry    A. fib, 90s to low 100s bpm mostly with occasional readings in the 130s to 150s bpm - Personally Reviewed  ECG    No new tracings - Personally Reviewed  Physical Exam   GEN: No acute distress.   Neck: No JVD. Cardiac:  Irregularly irregular, no murmurs, rubs, or gallops.  Respiratory: Clear to auscultation bilaterally.  GI: Soft, nontender, non-distended.   MS:  1+ bilateral posterior thigh, hip, and flank edema edema; No deformity. Neuro:  Alert and oriented x 3; Nonfocal.  Psych: Normal  affect.  Labs    Chemistry Recent Labs  Lab 07/02/20 0427 07/03/20 0456 07/04/20 0728  NA 140 138 137  K 4.3 4.3 4.4  CL 100 99 99  CO2 27 29 28   GLUCOSE 96 96 97  BUN 80* 82* 90*  CREATININE 3.86* 4.04* 4.43*  CALCIUM 8.8* 8.9 9.0  ALBUMIN 2.9* 3.1* 2.9*  GFRNONAA 10* 9* 8*  GFRAA 11* 11* 10*  ANIONGAP 13 10 10      Hematology Recent Labs  Lab 07/01/20 0641 07/02/20 0427 07/03/20 0456  WBC 5.0 4.2 5.3  RBC 4.93 4.91 5.06  HGB 10.9* 11.0* 11.6*  HCT 37.4 36.9 37.6  MCV 75.9* 75.2* 74.3*  MCH 22.1* 22.4* 22.9*  MCHC 29.1* 29.8* 30.9  RDW 20.3* 19.9* 20.5*  PLT 170 181 179    Cardiac EnzymesNo results for input(s): TROPONINI in the last 168 hours. No results for input(s): TROPIPOC in the last 168 hours.   BNPNo results for input(s): BNP, PROBNP in the last 168 hours.   DDimer No results for input(s): DDIMER in the last 168 hours.   Radiology    DG Chest 1 View  Result Date: 06/28/2020 IMPRESSION: Improved aeration particularly in the right lung base. Persistent  opacity in the left base is seen.   Cardiac Studies   2D echo 12/2019 1. Left ventricular ejection fraction, by estimation, is 50 to 55%. The  left ventricle has low normal function. The left ventricle has no regional  wall motion abnormalities. There is mild left ventricular hypertrophy.  Left ventricular diastolic  parameters are indeterminate.  2. Right ventricular systolic function is normal. The right ventricular  size is normal. Tricuspid regurgitation signal is inadequate for assessing  PA pressure.  3. Left atrial size was mildly dilated.  4. Right atrial size was mildly dilated.  5. The mitral valve is abnormal. Mild mitral valve regurgitation. No  evidence of mitral stenosis.  6. Tricuspid valve regurgitation is mild to moderate.  7. The aortic valve is tricuspid. Aortic valve regurgitation is not  visualized. No aortic stenosis is present.  8. The inferior vena cava is  dilated in size with <50% respiratory  variability, suggesting right atrial pressure of 15 mmHg. _____________   2D echo 07/01/2020  1. Left ventricular ejection fraction, by estimation, is 20 to 25%. The  left ventricle has severely decreased function. The left ventricle  demonstrates global hypokinesis. Left ventricular diastolic parameters are  indeterminate.  2. Right ventricular systolic function is mildly reduced. The right  ventricular size is normal.  3. Left atrial size was severely dilated.  4. Right atrial size was severely dilated.  5. Moderate pleural effusion in the left lateral region.  6. The mitral valve is normal in structure. Mild to moderate mitral valve  regurgitation.  7. Tricuspid valve regurgitation is moderate.  8. The aortic valve is tricuspid. Aortic valve regurgitation is not  visualized.   Patient Profile     86 y.o. female with history of permanent atrial fibrillation, HFpEF, CKD stage IV, hypertension, lymphoma, and anemia of chronic disease who presented due to shortness of breath and volume overload in the setting of medication noncompliance.  Renal fxn has worsened w/ diuresis this admission.  EF now 20-25%.  Assessment & Plan    1. Acute on HFrEF/pulmonary hypertension:   -Prior h/o HFpEF, however, echo this admission w/ EF 20-25%.  Admitted 9/17 after several months of medication noncompliance and massive volume overload.  Course complicated by intermittent hypotension requiring initiation of midodrine, along with worsening renal failure.  Creat now 4.04.  Positive 240 mL overnight and net - 5.3 L for the admission.  Albumin 2.9, receiving repletion.  Her lower extremity swelling is out of proportion to her fluid status.  Suspect low right-sided pressures and elevated right-sided pressures.  Cannot exclude some degree of third spacing with hypoalbuminemia.  With advanced age and unclear coronary status, she is not an ideal inotrope candidate,  but may need to consider a short course of low-dose milrinone.  Will continue beta blocker as HRs previously out of control off of therapy.  She is not a candidate for digoxin.  She remains off diuretic with worsening renal function.  Continue to hold Lasix with declining renal function.  Recommend follow up with the advanced heart failure service, if she would like.  2.  Acute on chronic renal failure:   -BUN/SCr continues to decline.  Nephrology following.  Lasix drip has been discontinued.  Cannot exclude cardiorenal syndrome in the setting of low output CHF.  .   3.  Permanent Afib:  -Ventricular rates are reasonably rate controlled on metoprolol.    She was started back on renally dosed Eliquis 10/1, per MD.  There are  some concerns regarding INR follow-up with regard to warfarin.  However, she may require warfarin as outpatient if renal her function doesn't improve.   4.  Elevated HsTroponin/Demand ischemia:  -Mild elevation with flat trend.  No chest pain this admission.  No prior ischemic evaluation, but coronary artery calcium noted on prior CT.  EF now depressed at 20-25%.  Unfortunately, worsening renal failure makes her a poor candidate for ischemic evaluation at this time.  Cont beta blocker/statin.  Seen by palliative care.  Follow up as an outpatient.   5.  Hypoalbuminemia:  Albumin 2.9 this morning. Receiving IV albumin.  Per IM.  6.  Microcytic anemia:  Stable.  For questions or updates, please contact [place] Please consult [email or web address] for contact info under Cardiology/STEMI.    Signed, Christell Faith, PA-C [place] Pager: [phone] 07/04/2020, 8:55 AM

## 2020-07-04 NOTE — Plan of Care (Signed)
  Problem: Education: Goal: Knowledge of General Education information will improve Description: Including pain rating scale, medication(s)/side effects and non-pharmacologic comfort measures Outcome: Not Progressing   Problem: Health Behavior/Discharge Planning: Goal: Ability to manage health-related needs will improve Outcome: Not Progressing   Problem: Clinical Measurements: Goal: Ability to maintain clinical measurements within normal limits will improve Outcome: Not Progressing Goal: Will remain free from infection Outcome: Not Progressing Goal: Diagnostic test results will improve Outcome: Not Progressing Goal: Respiratory complications will improve Outcome: Not Progressing Goal: Cardiovascular complication will be avoided Outcome: Not Progressing   Problem: Activity: Goal: Risk for activity intolerance will decrease Outcome: Not Progressing   Problem: Nutrition: Goal: Adequate nutrition will be maintained Outcome: Not Progressing   Problem: Coping: Goal: Level of anxiety will decrease Outcome: Not Progressing   Problem: Elimination: Goal: Will not experience complications related to bowel motility Outcome: Not Progressing Goal: Will not experience complications related to urinary retention Outcome: Not Progressing   Problem: Pain Managment: Goal: General experience of comfort will improve Outcome: Not Progressing   Problem: Safety: Goal: Ability to remain free from injury will improve Outcome: Not Progressing   Problem: Skin Integrity: Goal: Risk for impaired skin integrity will decrease Outcome: Not Progressing   Problem: Education: Goal: Ability to demonstrate management of disease process will improve Outcome: Not Progressing Goal: Ability to verbalize understanding of medication therapies will improve Outcome: Not Progressing Goal: Individualized Educational Video(s) Outcome: Not Progressing   Problem: Activity: Goal: Capacity to carry out  activities will improve Outcome: Not Progressing   Problem: Cardiac: Goal: Ability to achieve and maintain adequate cardiopulmonary perfusion will improve Outcome: Not Progressing     PT resting comfortably on RA overnight, vss, no new events

## 2020-07-04 NOTE — TOC Progression Note (Signed)
Transition of Care [facility]) - Progression Note    Patient Details  Name: Kristy Solomon MRN: [id number] Date of Birth: 1929/07/13  Transition of Care [facility]) CM/SW Contact  Zigmund Daniel Dorian Pod, RN Phone Number:  07/04/2020, 2:11 PM  Clinical Narrative:    RN spoke with the pt's daughter Bascom Levels [phone]) concerning levels of care. Caregiver feels pt needs higher level of care such as LTAC vs SNF that has been suggested. Pt current refused PT om a recent visit.   However OT recommends SNF. Daughter states pt has been confused at home and this morning was not able to recognize who she was entering the room. Daughter is very concerning about the pt's safety and really wants the pt placed long term. States she has been awaiting for the doctor to visit the room today however unsuccessful. RN offered to contact there attending with these concerns and provider her contact number for a possible response (receptive). No other needs or concerns  Will alert Dr. Mal Misty of the above concerns and spoke with pt concerning the pt's currently level of care would be SNF at this time.   TOC will continue to monitor pt's discharge disposition and assist accordingly.     Barriers to Discharge: Barriers Resolved  Expected Discharge Plan and Services                             DME Agency: [facility]       HH Arranged: RN, PT, OT, Nurse's Aide [place] Agency: [facility] Date [place]: 06/24/20 Time [place]: 1254 Representative spoke with at [place]: [place] ([place]) Interventions    Readmission Risk Interventions Readmission Risk Prevention Plan 06/21/2020  Transportation Screening Complete  PCP or Specialist Appt within 3-5 Days Complete  HRI or [place] Complete  Social Work Consult for [place] Planning/Counseling Complete  Palliative Care Screening Complete  Medication Review Press photographer) Complete   Some recent data might be hidden

## 2020-07-04 NOTE — Progress Notes (Signed)
Mobility Specialist - Progress Note   07/04/20 1600  Mobility  Range of Motion/Exercises Right arm;Left arm (Washed face, combed hair)  Level of Assistance Minimal assist, patient does 75% or more  Distance Ambulated (ft) 0 ft  Mobility Response Tolerated well  Mobility performed by Mobility specialist  $Mobility charge 1 Mobility    Pt was sitting in recliner upon arrival. Mobility entered room originally to assist with hygiene as pt had an incontinent episode while sitting. Pt c/o of raw feeling in groin and requested baby powder in specified area. Mobility assisted and replaced purewick. Pt then requested to wash face and comb hair. Mobility assisted with set up. Pt needed assistance combing the back of her head, mobility assisted. Pt was left in recliner with all needs in reach.   Kathee Delton Mobility Specialist 07/04/20, 4:22 PM

## 2020-07-05 LAB — RENAL FUNCTION PANEL
Albumin: 3.5 g/dL (ref 3.5–5.0)
Anion gap: 13 (ref 5–15)
BUN: 90 mg/dL — ABNORMAL HIGH (ref 8–23)
CO2: 28 mmol/L (ref 22–32)
Calcium: 9.1 mg/dL (ref 8.9–10.3)
Chloride: 100 mmol/L (ref 98–111)
Creatinine, Ser: 4.09 mg/dL — ABNORMAL HIGH (ref 0.44–1.00)
GFR calc Af Amer: 11 mL/min — ABNORMAL LOW (ref >60)
GFR calc non Af Amer: 9 mL/min — ABNORMAL LOW (ref >60)
Glucose, Bld: 113 mg/dL — ABNORMAL HIGH (ref 70–99)
Phosphorus: 4.9 mg/dL — ABNORMAL HIGH (ref 2.5–4.6)
Potassium: 4.1 mmol/L (ref 3.5–5.1)
Sodium: 141 mmol/L (ref 135–145)

## 2020-07-05 NOTE — Progress Notes (Signed)
Mobility Specialist - Progress Note   07/05/20 1100  Mobility  Activity Transferred:  Bed to chair;Transferred to/from [facility]  Range of Motion/Exercises Right leg;Left leg (Hip iso, straight leg raises, seated march)  Level of Assistance Moderate assist, patient does 50-74%  Assistive Device Front wheel walker  Distance Ambulated (ft) 6 ft  Mobility Response Tolerated well  Mobility performed by Mobility specialist  $Mobility charge 1 Mobility    Pre-mobility: 95 HR, 94% SpO2 Post-mobility: 97 HR, 95% SpO2   Pt was lying in bed upon arrival. Pt agreed to session. Pt denied any pain, nausea, or fatigue. Pt was modA in bed mobility and SBA performing EOB exercises: straight leg raises, hip isometrics, and seated march (10x/leg). Pt was minA in STS transfer to RW. Pt had an incontinent episode upon standing. Mobility assisted with hygiene. Pt requested getting to [facility]. Pt was modA in ambulation this date. Pt needing extra time and several cues for foot placement when turning. VC also needed to keep RW close to body for safe ambulation. Upon getting to [facility], pt had a very small BM. When finished, pt had a second incontinent episode while performing STS from [facility]. Mobility assisted with hygiene. Pt was able to get to recliner with same verbal cues repeated for proper transition. Overall, pt tolerated session well. Pt was left in recliner with all needs in reach and alarm set.    Kathee Delton Mobility Specialist 07/05/20, 12:06 PM

## 2020-07-05 NOTE — Progress Notes (Signed)
Progress Note  Patient Name: Kristy Solomon Date of Encounter: 07/05/2020  [facility] Cardiologist: Kate Sable, MD   Subjective   No acute events overnight. Asking about her kidneys, reviewed labs today. Discussed plan from a heart perspective. Breathing is stable, no chest pain. Edema unchanged.  Inpatient Medications    Scheduled Meds: . allopurinol  50 mg Oral Daily  . apixaban  2.5 mg Oral BID  . atorvastatin  20 mg Oral Daily  . ferrous sulfate  325 mg Oral Q breakfast  . gabapentin  300 mg Oral TID  . metoprolol succinate  50 mg Oral Daily  . midodrine  10 mg Oral TID WC  . polyethylene glycol  17 g Oral Daily  . senna-docusate  1 tablet Oral BID  . sodium chloride flush  3 mL Intravenous Q12H   Continuous Infusions: . sodium chloride    . albumin human 12.5 g (07/05/20 0820)   PRN Meds: sodium chloride, acetaminophen, ipratropium-albuterol, ondansetron (ZOFRAN) IV, sodium chloride flush   Vital Signs    Vitals:   07/05/20 0400 07/05/20 0742 07/05/20 0803 07/05/20 1203  BP: 128/90 135/78  92/68  Pulse: 72 93  84  Resp:  16  16  Temp: 98.6 F (37 C) 98.4 F (36.9 C)  98.5 F (36.9 C)  TempSrc: Oral Oral  Oral  SpO2: 91% (!) 88% 92% 95%  Weight: 99.1 kg     Height:        Intake/Output Summary (Last 24 hours) at 07/05/2020 1249 Last data filed at 07/05/2020 0405 Gross per 24 hour  Intake --  Output 50 ml  Net -50 ml   Last 3 Weights 07/05/2020 07/04/2020 07/03/2020  Weight (lbs) 218 lb 8 oz 220 lb 3.8 oz 221 lb  Weight (kg) 99.111 kg 99.9 kg 100.245 kg      Telemetry    Atrial fibrillation- Personally Reviewed  ECG    No new - Personally Reviewed  Physical Exam   GEN: No acute distress.   Neck: No JVD appreciated Cardiac: irregularly irregular, no murmurs, rubs, or gallops.  Respiratory: Clear to auscultation bilaterally. GI: Soft, nontender, non-distended  MS: dependent edema to hips Neuro:  Nonfocal  Psych: Normal affect    Labs    High Sensitivity Troponin:   Recent Labs  Lab 06/10/2020 0117 06/23/2020 0340  TROPONINIHS 86* 83*      Chemistry Recent Labs  Lab 07/03/20 0456 07/04/20 0728 07/05/20 0328  NA 138 137 141  K 4.3 4.4 4.1  CL 99 99 100  CO2 29 28 28   GLUCOSE 96 97 113*  BUN 82* 90* 90*  CREATININE 4.04* 4.43* 4.09*  CALCIUM 8.9 9.0 9.1  ALBUMIN 3.1* 2.9* 3.5  GFRNONAA 9* 8* 9*  GFRAA 11* 10* 11*  ANIONGAP 10 10 13      Hematology Recent Labs  Lab 07/01/20 0641 07/02/20 0427 07/03/20 0456  WBC 5.0 4.2 5.3  RBC 4.93 4.91 5.06  HGB 10.9* 11.0* 11.6*  HCT 37.4 36.9 37.6  MCV 75.9* 75.2* 74.3*  MCH 22.1* 22.4* 22.9*  MCHC 29.1* 29.8* 30.9  RDW 20.3* 19.9* 20.5*  PLT 170 181 179    BNPNo results for input(s): BNP, PROBNP in the last 168 hours.   DDimer No results for input(s): DDIMER in the last 168 hours.   Radiology    No results found.  Cardiac Studies   2D echo 12/2019 1. Left ventricular ejection fraction, by estimation, is 50 to 55%. The  left ventricle has low normal function. The left ventricle has no regional  wall motion abnormalities. There is mild left ventricular hypertrophy.  Left ventricular diastolic  parameters are indeterminate.  2. Right ventricular systolic function is normal. The right ventricular  size is normal. Tricuspid regurgitation signal is inadequate for assessing  PA pressure.  3. Left atrial size was mildly dilated.  4. Right atrial size was mildly dilated.  5. The mitral valve is abnormal. Mild mitral valve regurgitation. No  evidence of mitral stenosis.  6. Tricuspid valve regurgitation is mild to moderate.  7. The aortic valve is tricuspid. Aortic valve regurgitation is not  visualized. No aortic stenosis is present.  8. The inferior vena cava is dilated in size with <50% respiratory  variability, suggesting right atrial pressure of 15 mmHg. _____________  2D echo 07/01/2020  1. Left ventricular ejection  fraction, by estimation, is20 to 25%. The  left ventricle has severely decreased function. The left ventricle  demonstrates global hypokinesis. Left ventricular diastolic parameters are  indeterminate.  2. Right ventricular systolic function is mildly reduced. The right  ventricular size is normal.  3. Left atrial size was severely dilated.  4. Right atrial size was severely dilated.  5. Moderate pleural effusion in the left lateral region.  6. The mitral valve is normal in structure. Mild to moderate mitral valve  regurgitation.  7. Tricuspid valve regurgitation is moderate.  8. The aortic valve is tricuspid. Aortic valve regurgitation is not  visualized.   Patient Profile     86 y.o. female with history of permanent atrial fibrillation, HFpEF, CKD stage IV, hypertension, lymphoma, and anemia of chronic disease who presented due to shortness of breath and volume overload in the setting of medication noncompliance.Renal fxn has worsened w/ diuresis this admission. EF now 20-25%.  Assessment & Plan    Acute systolic and diastolic heart failure, with prior chronic diastolic heart failure Acute kidney injury on CKD stage 4 Hypotension -see my note yesterday re: complicated situation. Likely combination of acute heart failure, kidney injury, and hypoalbuminemia/third spacing. Her right heart failure appears worse than her left heart failure on exam -diuretics on hold, Cr 4.09 from 4.43 yesterday -based on yesterday's note, no plans for dialysis -on midodrine for hypotension, which in addition to renal disease precludes use of any other agents for medical management of heart failure. She is on a low dose of metoprolol succinate given her atrial fibrillation and appears to be perfusing well on exam -she is not a candidate for advanced heart failure given current clinical status, age and comborbidities. Would not recommend inotrope as there is no long term end point for  this.  Permanent atrial fibrillation -she is on apixaban 2.5 mg. There are noted concerns about warfarin and compliance. I am however concerned about her renal function and use of apixaban long term. Would recommend discussing in the framework of overall goals of care -on low dose metoprolol for rate control, as above, but monitor for evidence of worsening perfusion.  Overall difficult situation. With her heart failure and renal failure, with complication of hypotension, long term prognosis is poor. I discussed this with her briefly, and she is taking things one day at a time. Would continue to address.  For questions or updates, please contact [place] Please consult [email or web address] for contact info under    Signed, Buford Dresser, MD  07/05/2020, 12:49 PM

## 2020-07-05 NOTE — Progress Notes (Signed)
PROGRESS NOTE    Kristy Solomon  POE:[id number] DOB: 04-08-1929 DOA: 06/16/2020 PCP: Jodi Marble, MD   Brief Narrative:  Kristy Solomon is an 86 yo AA female with PMH chronic respiratory failure (on 2L [place] O2), HFpEF, permanent afib (on Xarelto), HTN, CKDIV HLD, hx lymphoma, anemia of chronic disease who presented to the hospital with worsening SOB. She had been out of her medications at home for approx a couple weeks.  She was found to be volume overloaded and in afib with RVR. Nephrology and cardiology were consulted on admission.  A CXR was obtained which revealed bilateral pleural effusions and pulmonary edema.  She was started on IV Lasix and resumed back on her beta-blocker. Patient was not responding well to diuresis, becoming more oliguric. Started on Lasix infusion along with midodrine to see if she responds. Patient started diuresing with improved urinary output but worsening creatinine, Lasix infusion was discontinued due to worsening creatinine.  Patient is not a good candidate for dialysis but wants to try it.  She does not want to give up and remain full code. Nephrology decided to place HD catheter tomorrow to give her a trial of dialysis. Palliative care was consulted.  Subjective: Patient appears tired, sitting in chair.  Stating that please do not bother me.  Assessment & Plan:   Principal Problem:   Acute on chronic combined systolic (congestive) and diastolic (congestive) heart failure (HCC) Active Problems:   Waldenstrom macroglobulinemia (HCC)   Thrombocytopenia (HCC)   HTN (hypertension)   GERD (gastroesophageal reflux disease)   CKD (chronic kidney disease), stage IV (HCC)   Atrial fibrillation with rapid ventricular response (HCC)   Non compliance w medication regimen   Permanent atrial fibrillation (HCC)  Acute on chronic heart failure with preserved ejection fraction (HFpEF) ([place]).  Cardiology is following-appreciate their recommendation. Initially  treated with IV Lasix and then it was held for couple of days due to worsening renal function.  Home dose of torsemide was restarted by cardiology, IV Lasix at an higher dose was resumed due to poor urinary output. Patient remained oliguric, just made 400 cc over the past 24-hour with IV Lasix of 80 mg twice daily.  Softer blood pressure.  He is not a candidate for HD due to poor functional status. -She was started on Lasix infusion along with midodrine on 06/29/20, improved diuresis with UOP of 1475, creatinine continue to get worse and Lasix infusion was discontinued.  Remains volume up.  Per cardiology she might not tolerate dialysis but patient wants to give it a trial. Albumin was also added for oncotic support. -Metoprolol dose was decreased to 75 by cardiology. -Continue with strict intake and output and daily weight. -PT were initially recommending home health services which were ordered but due to persistent decline, reevaluation obtained and they are recommending SNF. -Palliative care was consulted to discuss goals of care.  Hypotension.  Intermittently becoming hypotensive.  There was a huge difference between blood pressure measured on right and left upper extremities.  Right systolic mostly in the 31V with left systolic in 40G and 86P.  No prior diagnosis of coarctation or subclavian stenosis. -Continue midodrine 5 mg 3 times a day.  Atrial fibrillation with rapid ventricular response ([place]).  Most likely secondary to being out of medications at home.  Currently rate well controlled. -Continue home dose of Toprol. -Xarelto was discontinued due to worsening renal function and she was started on heparin infusion. -Eliquis intolerance was listed in her chart-later able  to transition her to renally dose Eliquis.  GERD (gastroesophageal reflux disease) - continue PPI  HTN (hypertension).  Blood pressure within goal today. Keep holding home antihypertensives as patient is on midodrine  now.  AKI with CKD stage IV.  Most likely secondary to cardiorenal syndrome.  Baseline creatinine around 1.7 per nephrology. Nephrology is following-appreciate their recommendations. Worsening creatinine.  Remains volume overload. Patient decided to try dialysis-HD catheter to be placed tomorrow. -Continue to monitor. -Avoid nephrotoxins  Thrombocytopenia (Kristy Solomon).  Seems stable and improving. - patient has mild chronic thrombocytopenia at baseline ~120-140k - etiology due to her underlying Waldenstrom's macroglobulinemia and effect from ibrutinib; follows with oncology - okay to continue xarelto for now   Waldenstrom macroglobulinemia [facility]) - follows with oncology; last OV 04/23/20 - continue ibrutinib   Objective: Vitals:   07/05/20 0742 07/05/20 0803 07/05/20 1203 07/05/20 1622  BP: 135/78  92/68 114/89  Pulse: 93  84 81  Resp: 16  16 16   Temp: 98.4 F (36.9 C)  98.5 F (36.9 C) 98.3 F (36.8 C)  TempSrc: Oral  Oral Oral  SpO2: (!) 88% 92% 95% 95%  Weight:      Height:        Intake/Output Summary (Last 24 hours) at 07/05/2020 2025 Last data filed at 07/05/2020 1800 Gross per 24 hour  Intake --  Output 700 ml  Net -700 ml   Filed Weights   07/03/20 0506 07/04/20 0500 07/05/20 0400  Weight: 100.2 kg 99.9 kg 99.1 kg    Examination:  General.  Lethargic looking elderly lady, in no acute distress. Pulmonary.  Decreased breath sound at bases, normal respiratory effort. CV.  Regular rate and rhythm, no JVD, rub or murmur. Abdomen.  Soft, nontender, nondistended, BS positive. CNS.  Alert and oriented .  No focal neurologic deficit. Extremities.  2+ LE edema, no cyanosis, pulses intact and symmetrical. Psychiatry.  Judgment and insight appears normal.  DVT prophylaxis: Eliquis Code Status: Full Family Communication: No family at bedside Disposition Plan:  Status is: Inpatient  Remains inpatient appropriate because:Inpatient level of care appropriate due to severity  of illness   Dispo: The patient is from: Home              Anticipated d/c is to: Home              Anticipated d/c date is: 2-3 days.              Patient currently is not medically stable to d/c.  Patient is high risk for readmission, deterioration and death.  Remains volume overload.  History of being noncompliant.  Palliative care was consulted.  Patient wants to try dialysis before deciding about going with hospice.  Consultants:   Cardiology  Nephrology  Palliative care  Procedures:  Antimicrobials:   Data Reviewed: I have personally reviewed following labs and imaging studies  CBC: Recent Labs  Lab 06/29/20 0455 06/30/20 0320 07/01/20 0641 07/02/20 0427 07/03/20 0456  WBC 5.8 4.5 5.0 4.2 5.3  HGB 11.3* 11.1* 10.9* 11.0* 11.6*  HCT 39.4 38.7 37.4 36.9 37.6  MCV 77.6* 76.2* 75.9* 75.2* 74.3*  PLT 155 157 170 181 833   Basic Metabolic Panel: Recent Labs  Lab 07/01/20 0641 07/02/20 0427 07/03/20 0456 07/04/20 0728 07/05/20 0328  NA 137 140 138 137 141  K 4.4 4.3 4.3 4.4 4.1  CL 99 100 99 99 100  CO2 28 27 29 28 28   GLUCOSE 107* 96 96 97 113*  BUN 80* 80* 82* 90* 90*  CREATININE 3.81* 3.86* 4.04* 4.43* 4.09*  CALCIUM 8.7* 8.8* 8.9 9.0 9.1  PHOS 4.7* 4.7* 5.7* 5.4* 4.9*   GFR: Estimated Creatinine Clearance: 10.9 mL/min (A) (by C-G formula based on SCr of 4.09 mg/dL (H)). Liver Function Tests: Recent Labs  Lab 07/01/20 0641 07/02/20 0427 07/03/20 0456 07/04/20 0728 07/05/20 0328  ALBUMIN 2.9* 2.9* 3.1* 2.9* 3.5   No results for input(s): LIPASE, AMYLASE in the last 168 hours. No results for input(s): AMMONIA in the last 168 hours. Coagulation Profile: Recent Labs  Lab 06/29/20 1626  INR 1.4*   Cardiac Enzymes: No results for input(s): CKTOTAL, CKMB, CKMBINDEX, TROPONINI in the last 168 hours. BNP (last 3 results) No results for input(s): PROBNP in the last 8760 hours. HbA1C: No results for input(s): HGBA1C in the last 72 hours. CBG: No  results for input(s): GLUCAP in the last 168 hours. Lipid Profile: No results for input(s): CHOL, HDL, LDLCALC, TRIG, CHOLHDL, LDLDIRECT in the last 72 hours. Thyroid Function Tests: No results for input(s): TSH, T4TOTAL, FREET4, T3FREE, THYROIDAB in the last 72 hours. Anemia Panel: No results for input(s): VITAMINB12, FOLATE, FERRITIN, TIBC, IRON, RETICCTPCT in the last 72 hours. Sepsis Labs: No results for input(s): PROCALCITON, LATICACIDVEN in the last 168 hours.  No results found for this or any previous visit (from the past 240 hour(s)).   Radiology Studies: No results found.  Scheduled Meds: . allopurinol  50 mg Oral Daily  . apixaban  2.5 mg Oral BID  . atorvastatin  20 mg Oral Daily  . ferrous sulfate  325 mg Oral Q breakfast  . gabapentin  300 mg Oral TID  . metoprolol succinate  50 mg Oral Daily  . midodrine  10 mg Oral TID WC  . polyethylene glycol  17 g Oral Daily  . senna-docusate  1 tablet Oral BID  . sodium chloride flush  3 mL Intravenous Q12H   Continuous Infusions: . sodium chloride    . albumin human Stopped (07/05/20 1200)     LOS: 16 days   Time spent: 30 minutes.  Lorella Nimrod, MD [facility]  If 7PM-7AM, please contact night-coverage [email or web address]  07/05/2020, 8:25 PM   This record has been created using Systems analyst. Errors have been sought and corrected,but may not always be located. Such creation errors do not reflect on the standard of care.

## 2020-07-05 NOTE — Progress Notes (Signed)
[place], [place] 07/05/20  Subjective:   Hospital day # 16  Cardiology note reviewed in depth today. Again spoke to the patient about renal placement therapy. After considering the relevant options patient reports that she is now willing to consider renal placement therapy. We explained this in depth today including the potential for PermCath placement. She states that she would like to proceed and see if this benefits her clinically.   Renal: 10/02 0701 - 10/03 0700 In: -  Out: 50 [Urine:50] Lab Results  Component Value Date   CREATININE 4.09 (H) 07/05/2020   CREATININE 4.43 (H) 07/04/2020   CREATININE 4.04 (H) 07/03/2020     Objective:  Vital signs in last 24 hours:  Temp:  [98.3 F (36.8 C)-98.6 F (37 C)] 98.5 F (36.9 C) (10/03 1203) Pulse Rate:  [71-93] 84 (10/03 1203) Resp:  [16-18] 16 (10/03 1203) BP: (92-135)/(68-90) 92/68 (10/03 1203) SpO2:  [88 %-95 %] 95 % (10/03 1203) Weight:  [99.1 kg] 99.1 kg (10/03 0400)  Weight change: -0.789 kg Filed Weights   07/03/20 0506 07/04/20 0500 07/05/20 0400  Weight: 100.2 kg 99.9 kg 99.1 kg    Intake/Output:    Intake/Output Summary (Last 24 hours) at 07/05/2020 1413 Last data filed at 07/05/2020 0405 Gross per 24 hour  Intake --  Output 50 ml  Net -50 ml     Physical Exam: General:  In no acute distress  HEENT  Normocephalic, atraumatic  Pulm/lungs  Lungs diminished at the bases  CVS/Heart  irregular  Abdomen:   Soft, obese  Extremities:  1+  Peripheral edema  Neurologic:  Alert, oriented  Skin:  No acute rashes    Basic Metabolic Panel:  Recent Labs  Lab 07/01/20 0641 07/01/20 0641 07/02/20 0427 07/02/20 0427 07/03/20 0456 07/04/20 0728 07/05/20 0328  NA 137  --  140  --  138 137 141  K 4.4  --  4.3  --  4.3 4.4 4.1  CL 99  --  100  --  99 99 100  CO2 28  --  27  --  29 28 28   GLUCOSE 107*  --  96  --  96 97 113*  BUN 80*  --  80*  --  82* 90* 90*  CREATININE  3.81*  --  3.86*  --  4.04* 4.43* 4.09*  CALCIUM 8.7*   < > 8.8*   < > 8.9 9.0 9.1  PHOS 4.7*  --  4.7*  --  5.7* 5.4* 4.9*   < > = values in this interval not displayed.     CBC: Recent Labs  Lab 06/29/20 0455 06/30/20 0320 07/01/20 0641 07/02/20 0427 07/03/20 0456  WBC 5.8 4.5 5.0 4.2 5.3  HGB 11.3* 11.1* 10.9* 11.0* 11.6*  HCT 39.4 38.7 37.4 36.9 37.6  MCV 77.6* 76.2* 75.9* 75.2* 74.3*  PLT 155 157 170 181 179      Lab Results  Component Value Date   HEPBSAG Negative 09/11/2018      Microbiology:  No results found for this or any previous visit (from the past 240 hour(s)).  Coagulation Studies: No results for input(s): LABPROT, INR in the last 72 hours.  Urinalysis: No results for input(s): COLORURINE, LABSPEC, PHURINE, GLUCOSEU, HGBUR, BILIRUBINUR, KETONESUR, PROTEINUR, UROBILINOGEN, NITRITE, LEUKOCYTESUR in the last 72 hours.  Invalid input(s): APPERANCEUR    Imaging: No results found.   Medications:   . sodium chloride    . albumin human 12.5 g (07/05/20 0820)   .  allopurinol  50 mg Oral Daily  . apixaban  2.5 mg Oral BID  . atorvastatin  20 mg Oral Daily  . ferrous sulfate  325 mg Oral Q breakfast  . gabapentin  300 mg Oral TID  . metoprolol succinate  50 mg Oral Daily  . midodrine  10 mg Oral TID WC  . polyethylene glycol  17 g Oral Daily  . senna-docusate  1 tablet Oral BID  . sodium chloride flush  3 mL Intravenous Q12H   sodium chloride, acetaminophen, ipratropium-albuterol, ondansetron (ZOFRAN) IV, sodium chloride flush  Assessment/ Plan:  86 y.o. female with congestive heart failure, hypertension, atrial fibrillation, hyperlipidemia, history of lymphoma admitted on 06/08/2020 for Hypoxia [R09.02] Elevated TSH [R79.89] Acute CHF (congestive heart failure) (HCC) [I50.9] Acute on chronic congestive heart failure, unspecified heart failure type (HCC) [I50.9]   #Acute kidney injury with volume overload #Chronic kidney disease stage IV.   Baseline creatinine 1.75 from May 08, 2020.  GFR 26-30.  Underlying CKD likely secondary to atherosclerosis, advanced age and hypertension  AKI likely secondary to cardiorenal syndrome Lab Results  Component Value Date   CREATININE 4.09 (H) 07/05/2020   CREATININE 4.43 (H) 07/04/2020   CREATININE 4.04 (H) 07/03/2020  Patient continues to have significant renal dysfunction with a BUN of 90 and creatinine of 4.09 and overall low EGFR of 11.  Cardiology note reviewed in depth.  We again spoke about additional treatment options including renal placement therapy.  After careful consideration today the patient states that she would like to consider dialysis and move forward for at least a time limited trial to see if it has some benefit.  We talked about PermCath placement.  She is now willing to move forward therefore we will consult with vascular surgery for PermCath placement for tomorrow with initiation of dialysis treatment thereafter.  #Hypotension, chronic We will continue to provide midodrine support.  We may need to use albumin along with lower blood flow rates during dialysis to help support blood pressure and allow for volume removal.        LOS: 16 Tsutomu Barfoot 10/3/20212:13 PM  [place], [place]

## 2020-07-06 ENCOUNTER — Other Ambulatory Visit (INDEPENDENT_AMBULATORY_CARE_PROVIDER_SITE_OTHER): Payer: Self-pay | Admitting: Vascular Surgery

## 2020-07-06 ENCOUNTER — Encounter: Admission: EM | Disposition: E | Payer: Self-pay | Source: Home / Self Care | Attending: Internal Medicine

## 2020-07-06 DIAGNOSIS — N185 Chronic kidney disease, stage 5: Secondary | ICD-10-CM

## 2020-07-06 HISTORY — PX: DIALYSIS/PERMA CATHETER INSERTION: CATH118288

## 2020-07-06 LAB — HEPATITIS B SURFACE ANTIBODY,QUALITATIVE: Hep B S Ab: NONREACTIVE

## 2020-07-06 LAB — RENAL FUNCTION PANEL
Albumin: 3.5 g/dL (ref 3.5–5.0)
Anion gap: 13 (ref 5–15)
BUN: 85 mg/dL — ABNORMAL HIGH (ref 8–23)
CO2: 29 mmol/L (ref 22–32)
Calcium: 9.3 mg/dL (ref 8.9–10.3)
Chloride: 101 mmol/L (ref 98–111)
Creatinine, Ser: 3.54 mg/dL — ABNORMAL HIGH (ref 0.44–1.00)
GFR calc Af Amer: 13 mL/min — ABNORMAL LOW (ref >60)
GFR calc non Af Amer: 11 mL/min — ABNORMAL LOW (ref >60)
Glucose, Bld: 133 mg/dL — ABNORMAL HIGH (ref 70–99)
Phosphorus: 4.9 mg/dL — ABNORMAL HIGH (ref 2.5–4.6)
Potassium: 4.6 mmol/L (ref 3.5–5.1)
Sodium: 143 mmol/L (ref 135–145)

## 2020-07-06 LAB — PHOSPHORUS: Phosphorus: 5.2 mg/dL — ABNORMAL HIGH (ref 2.5–4.6)

## 2020-07-06 LAB — HEPATITIS B SURFACE ANTIGEN: Hepatitis B Surface Ag: NONREACTIVE

## 2020-07-06 SURGERY — DIALYSIS/PERMA CATHETER INSERTION
Anesthesia: Choice

## 2020-07-06 MED ORDER — CEFAZOLIN SODIUM-DEXTROSE 1-4 GM/50ML-% IV SOLN
1.0000 g | Freq: Once | INTRAVENOUS | Status: AC
  Administered 2020-07-06: 1 g via INTRAVENOUS

## 2020-07-06 MED ORDER — PENTAFLUOROPROP-TETRAFLUOROETH EX AERO
1.0000 "application " | INHALATION_SPRAY | CUTANEOUS | Status: DC | PRN
  Filled 2020-07-06: qty 30

## 2020-07-06 MED ORDER — HYDROMORPHONE HCL 1 MG/ML IJ SOLN
1.0000 mg | Freq: Once | INTRAMUSCULAR | Status: AC | PRN
  Administered 2020-07-08: 1 mg via INTRAVENOUS
  Filled 2020-07-06: qty 1

## 2020-07-06 MED ORDER — HEPARIN SODIUM (PORCINE) 1000 UNIT/ML DIALYSIS
1000.0000 [IU] | INTRAMUSCULAR | Status: DC | PRN
  Filled 2020-07-06: qty 1

## 2020-07-06 MED ORDER — MIDAZOLAM HCL 2 MG/2ML IJ SOLN
INTRAMUSCULAR | Status: AC
  Filled 2020-07-06: qty 2

## 2020-07-06 MED ORDER — METHYLPREDNISOLONE SODIUM SUCC 125 MG IJ SOLR: 125.0000 mg | Freq: Once | INTRAMUSCULAR | Status: DC | PRN

## 2020-07-06 MED ORDER — LIDOCAINE HCL (PF) 1 % IJ SOLN
5.0000 mL | INTRAMUSCULAR | Status: DC | PRN
  Filled 2020-07-06: qty 5

## 2020-07-06 MED ORDER — FENTANYL CITRATE (PF) 100 MCG/2ML IJ SOLN
INTRAMUSCULAR | Status: DC | PRN
Start: 2020-07-06 — End: 2020-07-06
  Administered 2020-07-06: 25 ug via INTRAVENOUS

## 2020-07-06 MED ORDER — ONDANSETRON HCL 4 MG/2ML IJ SOLN: 4.0000 mg | Freq: Four times a day (QID) | INTRAMUSCULAR | Status: DC | PRN

## 2020-07-06 MED ORDER — FAMOTIDINE 20 MG PO TABS: 40.0000 mg | ORAL_TABLET | Freq: Once | ORAL | Status: DC | PRN

## 2020-07-06 MED ORDER — SODIUM CHLORIDE 0.9 % IV SOLN: INTRAVENOUS | Status: DC

## 2020-07-06 MED ORDER — SODIUM CHLORIDE 0.9 % IV SOLN: 100.0000 mL | INTRAVENOUS | Status: DC | PRN

## 2020-07-06 MED ORDER — HEPARIN (PORCINE) IN NACL 1000-0.9 UT/500ML-% IV SOLN
INTRAVENOUS | Status: DC | PRN
  Administered 2020-07-06: 500 mL

## 2020-07-06 MED ORDER — ALTEPLASE 2 MG IJ SOLR: 2.0000 mg | Freq: Once | INTRAMUSCULAR | Status: DC | PRN

## 2020-07-06 MED ORDER — MIDAZOLAM HCL 2 MG/2ML IJ SOLN
INTRAMUSCULAR | Status: DC | PRN
  Administered 2020-07-06: 1 mg via INTRAVENOUS

## 2020-07-06 MED ORDER — DIPHENHYDRAMINE HCL 50 MG/ML IJ SOLN: 50.0000 mg | Freq: Once | INTRAMUSCULAR | Status: DC | PRN

## 2020-07-06 MED ORDER — MIDAZOLAM HCL 2 MG/ML PO SYRP: 8.0000 mg | ORAL_SOLUTION | Freq: Once | ORAL | Status: DC | PRN

## 2020-07-06 MED ORDER — CHLORHEXIDINE GLUCONATE CLOTH 2 % EX PADS
6.0000 | MEDICATED_PAD | Freq: Every day | CUTANEOUS | Status: DC
  Administered 2020-07-06 – 2020-07-08 (×2): 6 via TOPICAL

## 2020-07-06 MED ORDER — LIDOCAINE-PRILOCAINE 2.5-2.5 % EX CREA
1.0000 "application " | TOPICAL_CREAM | CUTANEOUS | Status: DC | PRN
  Filled 2020-07-06: qty 5

## 2020-07-06 MED ORDER — HEPARIN (PORCINE) IN NACL 1000-0.9 UT/500ML-% IV SOLN
INTRAVENOUS | Status: AC
  Filled 2020-07-06: qty 1000

## 2020-07-06 MED ORDER — FENTANYL CITRATE (PF) 100 MCG/2ML IJ SOLN
INTRAMUSCULAR | Status: AC
  Filled 2020-07-06: qty 2

## 2020-07-06 SURGICAL SUPPLY — 8 items
CANNULA 5F STIFF (CANNULA) ×2 IMPLANT
CATH CANNON HEMO 15FR 19 (HEMODIALYSIS SUPPLIES) ×2 IMPLANT
DERMABOND ADVANCED (GAUZE/BANDAGES/DRESSINGS) ×2
DERMABOND ADVANCED .7 DNX12 (GAUZE/BANDAGES/DRESSINGS) IMPLANT
PACK ANGIOGRAPHY (CUSTOM PROCEDURE TRAY) ×2 IMPLANT
SUT MNCRL 4-0 (SUTURE) ×2
SUT MNCRL 4-0 27XMFL (SUTURE) ×1
SUTURE MNCRL 4-0 27XMF (SUTURE) IMPLANT

## 2020-07-06 NOTE — H&P (View-Only) (Signed)
[facility] Consult Note  MRN : [id number]  Kristy Solomon is a 86 y.o. (06/08/1929) female who presents with chief complaint of  Chief Complaint  Patient presents with   Shortness of Breath   History of Present Illness:  Kristy Solomon is a 86 year old female with medical history significant for chronic respiratory failure, chronic diastolic dysfunction CHF, atrial fibrillation on chronic anticoagulation therapy, hypertension with complications of chronic kidney disease and history of malignant lymphoma who presented to the emergency room via EMS for evaluation of worsening shortness of breath.    Patient stated that she had had to increase her oxygen to 3 L from 2 L due to her symptoms.  Upon arrival to the emergency room she was noted to be tachycardic and was in rapid A. fib with heart rate of 170bpm.  She received a dose of IV metoprolol 2.5 mg with improvement in her heart rate. Patient complains of worsening shortness of breath and is short of breath at rest associated with orthopnea and bilateral lower extremity swelling.  She also complains of palpitations but denies having any chest pain, no diaphoresis, no nausea, no vomiting, no abdominal pain or any changes in her bowel habits.  Nephrology would like to initiate dialysis at this time however the patient does not have an adequate dialysis access.  Vascular surgery was consulted by Dr. Holley Raring for possible PermCath placement.  Current Facility-Administered Medications  Medication Dose Route Frequency Provider Last Rate Last Admin   0.9 %  sodium chloride infusion  250 mL Intravenous PRN Agbata, Tochukwu, MD       acetaminophen (TYLENOL) tablet 650 mg  650 mg Oral Q4H PRN Agbata, Tochukwu, MD   650 mg at 06/29/20 0948   albumin human 25 % solution 12.5 g  12.5 g Intravenous Daily Murlean Iba, MD   Stopped at 07/05/20 1200   allopurinol (ZYLOPRIM) tablet 50 mg  50 mg Oral Daily Agbata,  Tochukwu, MD   50 mg at 07/05/20 4098   apixaban (ELIQUIS) tablet 2.5 mg  2.5 mg Oral BID Minna Merritts, MD   2.5 mg at 07/05/20 2256   atorvastatin (LIPITOR) tablet 20 mg  20 mg Oral Daily Agbata, Tochukwu, MD   20 mg at 07/05/20 1191   Chlorhexidine Gluconate Cloth 2 % PADS 6 each  6 each Topical Q0600 Lateef, Munsoor, MD       ferrous sulfate tablet 325 mg  325 mg Oral Q breakfast Dwyane Dee, MD   325 mg at 07/05/20 4782   gabapentin (NEURONTIN) capsule 300 mg  300 mg Oral TID Agbata, Tochukwu, MD   300 mg at 07/05/20 2256   ipratropium-albuterol (DUONEB) 0.5-2.5 (3) MG/3ML nebulizer solution 3 mL  3 mL Nebulization Q6H PRN Agbata, Tochukwu, MD       metoprolol succinate (TOPROL-XL) 24 hr tablet 50 mg  50 mg Oral Daily Candiss Norse, Harmeet, MD   50 mg at 07/05/20 0820   midodrine (PROAMATINE) tablet 10 mg  10 mg Oral TID WC Kate Sable, MD   10 mg at 07/05/20 1810   ondansetron (ZOFRAN) injection 4 mg  4 mg Intravenous Q6H PRN Agbata, Tochukwu, MD       polyethylene glycol (MIRALAX / GLYCOLAX) packet 17 g  17 g Oral Daily Dwyane Dee, MD   17 g at 07/04/20 0810   senna-docusate (Senokot-S) tablet 1 tablet  1 tablet Oral BID Dwyane Dee, MD   1 tablet at 07/05/20 2256  sodium chloride flush (NS) 0.9 % injection 3 mL  3 mL Intravenous Q12H Agbata, Tochukwu, MD   3 mL at 07/05/20 2300   sodium chloride flush (NS) 0.9 % injection 3 mL  3 mL Intravenous PRN Agbata, Tochukwu, MD       Facility-Administered Medications Ordered in Other Encounters  Medication Dose Route Frequency Provider Last Rate Last Admin   cyanocobalamin ((VITAMIN B-12)) injection 1,000 mcg  1,000 mcg Intramuscular Once Lequita Asal, MD       Past Medical History:  Diagnosis Date   (HFpEF) heart failure with preserved ejection fraction ([place])    a. 12/2019 Echo: EF 50-55%, no rwma, mild LVH. Nl RV fxn. Mildly BAE. Mild MR, mild-mod TR.   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 Seen on CT 05/2020.   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 CHA2DS2VASc = 6-->eliquis.   Past Surgical History:  Procedure Laterality Date   ABDOMINAL HYSTERECTOMY     HEMORRHOID SURGERY     Social History Social History   Tobacco Use   Smoking status: Never Smoker   Smokeless tobacco: Never Used  Vaping Use   Vaping Use: Never used  Substance Use Topics   Alcohol use: No   Drug use: No   Family History Family History  Problem Relation Age of Onset   Cancer Brother        throat ca   Cancer Brother        bone cancer  Denies family hx peripheral artery disease, venous disease or renal disease.  No Active Allergies  REVIEW OF SYSTEMS (Negative unless checked)  Constitutional: [] Weight loss  [] Fever  [] Chills Cardiac: [] Chest pain   [] Chest pressure   [] Palpitations   [x] Shortness of breath when laying flat   [x] Shortness of breath at rest   [x] Shortness of breath with exertion. Vascular:  [] Pain in legs with walking   [] Pain in legs at rest   [] Pain in legs when laying flat   [] Claudication   [] Pain in feet when walking  [] Pain in feet at rest  [] Pain in feet when laying flat   [] History of DVT   [] Phlebitis   [x] Swelling in legs   [] Varicose veins   [] Non-healing ulcers Pulmonary:   [] Uses home oxygen   [] Productive cough   [] Hemoptysis   [] Wheeze  [] COPD   [] Asthma Neurologic:  [] Dizziness  [] Blackouts   [] Seizures   [] History of stroke   [] History of TIA  [] Aphasia   [] Temporary blindness   [] Dysphagia   [] Weakness or numbness in arms   [] Weakness or numbness in legs Musculoskeletal:  [] Arthritis   [] Joint swelling   [] Joint pain   [] Low back pain Hematologic:  [] Easy bruising  [] Easy bleeding    [] Hypercoagulable state   [] Anemic  [] Hepatitis Gastrointestinal:  [] Blood in stool   [] Vomiting blood  [] Gastroesophageal reflux/heartburn   [] Difficulty swallowing. Genitourinary:  [x] Chronic kidney disease   [] Difficult urination  [] Frequent urination  [] Burning with urination   [] Blood in urine Skin:  [] Rashes   [] Ulcers   [] Wounds Psychological:  [] History of anxiety   []  History of major depression.  Physical Examination  Vitals:   07/05/20 1622 07/05/20 2031 07/14/2020 8416 07/08/2020 6063  BP: 114/89 (!) 118/92 (!) 156/94 (!) 158/102  Pulse: 81 63 (!) 53 72  Resp: 16 (!) 21 19 (!) 22  Temp: 98.3 F (36.8 C) 98.5 F (36.9 C) 98.5 F (36.9 C) 97.7 F (36.5 C)  TempSrc: Oral Oral Oral Oral  SpO2: 95% 95% 94% 96%  Weight:   97.5 kg   Height:       Body mass index is 35.78 kg/m. Gen:  WD/WN, NAD Head: [place]/AT, No temporalis wasting. Prominent temp pulse not noted. Ear/Nose/Throat: Hearing grossly intact, nares w/o erythema or drainage, oropharynx w/o Erythema/Exudate Eyes: Sclera non-icteric, conjunctiva clear Neck: Trachea midline.  No JVD.  Pulmonary:  Good air movement, respirations not labored, equal bilaterally.  Cardiac: RRR, normal S1, S2. Vascular:  Vessel Right Left  Radial Palpable Palpable  Ulnar Palpable Palpable  Brachial Palpable Palpable  Carotid Palpable, without bruit Palpable, without bruit  Aorta Not palpable N/A  Femoral Palpable Palpable  Popliteal Palpable Palpable  PT Palpable Palpable  DP Palpable Palpable   Gastrointestinal: soft, non-tender/non-distended. No guarding/reflex.  Musculoskeletal: M/S 5/5 throughout.  Extremities without ischemic changes.  No deformity or atrophy. No edema. Neurologic: Sensation grossly intact in extremities.  Symmetrical.  Speech is fluent. Motor exam as listed above. Psychiatric: Judgment intact, Mood & affect appropriate for pt's clinical situation. Dermatologic: No rashes or ulcers noted.  No cellulitis or open  wounds. Lymph : No Cervical, Axillary, or Inguinal lymphadenopathy.  CBC Lab Results  Component Value Date   WBC 5.3 07/03/2020   HGB 11.6 (L) 07/03/2020   HCT 37.6 07/03/2020   MCV 74.3 (L) 07/03/2020   PLT 179 07/03/2020   BMET    Component Value Date/Time   NA 143 07/12/2020 0526   NA 141 05/08/2020 0000   NA 141 07/04/2014 1030   K 4.6 07/27/2020 0526   K 3.8 07/04/2014 1030   CL 101 07/26/2020 0526   CL 104 04/12/2014 0111   CO2 29 07/19/2020 0526   CO2 25 07/04/2014 1030   GLUCOSE 133 (H) 07/05/2020 0526   GLUCOSE 97 07/04/2014 1030   BUN 85 (H) 07/04/2020 0526   BUN 30 (H) 05/08/2020 0000   BUN 30.9 (H) 07/04/2014 1030   CREATININE 3.54 (H) 07/25/2020 0526   CREATININE 1.8 (H) 07/04/2014 1030   CALCIUM 9.3 07/05/2020 0526   CALCIUM 10.2 07/04/2014 1030   GFRNONAA 11 (L) 07/15/2020 0526   GFRNONAA 27 (L) 04/12/2014 0111   GFRAA 13 (L) 07/29/2020 0526   GFRAA 31 (L) 04/12/2014 0111   Estimated Creatinine Clearance: 12.4 mL/min (A) (by C-G formula based on SCr of 3.54 mg/dL (H)).  COAG Lab Results  Component Value Date   INR 1.4 (H) 06/29/2020   INR 1.34 03/18/2016   INR 1.0 02/14/2013   Radiology DG Chest 1 View  Result Date: 06/28/2020 CLINICAL DATA:  COVID-19 positivity with congestive failure EXAM: CHEST  1 VIEW COMPARISON:  06/18/2020 FINDINGS: Cardiac shadow remains enlarged. Aortic calcifications are again seen. Mild vascular congestion is noted although improved from the prior exam. Interval clearing of right-sided effusion and basilar atelectasis is noted. Persistent left basilar opacity is noted and stable. IMPRESSION: Improved aeration particularly in the right lung base. Persistent opacity in the left base is seen. Electronically Signed   By: Inez Catalina M.D.   On: 06/28/2020 14:47   DG Chest 2 View  Result Date: 06/17/2020 CLINICAL DATA:  Shortness of breath EXAM: CHEST - 2 VIEW COMPARISON:  01/24/2020, CT 05/20/2020 FINDINGS: Small  moderate  bilateral pleural effusions left greater than right. Basilar airspace consolidations. Cardiomegaly with vascular congestion. Aortic atherosclerosis. No pneumothorax. IMPRESSION: Cardiomegaly with vascular congestion, small to moderate bilateral pleural effusions left greater than right, and bibasilar airspace consolidations which may reflect atelectasis or pneumonia Electronically Signed   By: Donavan Foil M.D.   On: 06/03/2020 01:54   ECHOCARDIOGRAM COMPLETE  Result Date: 07/01/2020    ECHOCARDIOGRAM REPORT   Patient Name:   Kristy Solomon Date of Exam: 07/01/2020 Medical Rec #:  [id number]        Height:       65.0 in Accession #:    [id number]       Weight:       221.6 lb Date of Birth:  27-Feb-1929        BSA:          2.066 m Patient Age:    24 years         BP:           116/64 mmHg Patient Gender: F                HR:           83 bpm. Exam Location:  [facility] Procedure: 2D Echo, Cardiac Doppler and Color Doppler Indications:     CHF-acute diastolic 259.56  History:         Patient has prior history of Echocardiogram examinations, most                  recent 12/31/2019. Risk Factors:Dyslipidemia and Hypertension.                  Permanent Atrial fibrillation.  Sonographer:     Sherrie Sport RDCS (AE) Referring Phys:  [phone] CHRISTOPHER END Diagnosing Phys: Kate Sable MD IMPRESSIONS  1. Left ventricular ejection fraction, by estimation, is 20 to 25%. The left ventricle has severely decreased function. The left ventricle demonstrates global hypokinesis. Left ventricular diastolic parameters are indeterminate.  2. Right ventricular systolic function is mildly reduced. The right ventricular size is normal.  3. Left atrial size was severely dilated.  4. Right atrial size was severely dilated.  5. Moderate pleural effusion in the left lateral region.  6. The mitral valve is normal in structure. Mild to moderate mitral valve regurgitation.  7. Tricuspid valve regurgitation is moderate.  8. The aortic valve is  tricuspid. Aortic valve regurgitation is not visualized. FINDINGS  Left Ventricle: Left ventricular ejection fraction, by estimation, is 20 to 25%. The left ventricle has severely decreased function. The left ventricle demonstrates global hypokinesis. The left ventricular internal cavity size was normal in size. There is no left ventricular hypertrophy. Left ventricular diastolic parameters are indeterminate. Right Ventricle: The right ventricular size is normal. Right vetricular wall thickness was not assessed. Right ventricular systolic function is mildly reduced. Left Atrium: Left atrial size was severely dilated. Right Atrium: Right atrial size was severely dilated. Pericardium: There is no evidence of pericardial effusion. Mitral Valve: The mitral valve is normal in structure. Mild to moderate mitral valve regurgitation. Tricuspid Valve: The tricuspid valve is normal in structure. Tricuspid valve regurgitation is moderate. Aortic Valve: The aortic valve is tricuspid. Aortic valve regurgitation is not visualized. Aortic valve mean gradient measures 2.7 mmHg. Aortic valve peak gradient measures 4.0 mmHg. Aortic valve area, by VTI measures 1.73 cm. Pulmonic Valve: The pulmonic valve was normal in structure. Pulmonic valve regurgitation is not visualized. Aorta: The aortic root is  normal in size and structure. Venous: The inferior vena cava was not well visualized. IAS/Shunts: No atrial level shunt detected by color flow Doppler. Additional Comments: There is a moderate pleural effusion in the left lateral region.  LEFT VENTRICLE PLAX 2D LVIDd:         5.21 cm LVIDs:         4.27 cm LV PW:         1.15 cm LV IVS:        0.85 cm LVOT diam:     2.00 cm LV SV:         31 LV SV Index:   15 LVOT Area:     3.14 cm  RIGHT VENTRICLE RV Basal diam:  3.76 cm RV S prime:     10.20 cm/s TAPSE (M-mode): 3.8 cm LEFT ATRIUM              Index       RIGHT ATRIUM           Index Kristy diam:        4.50 cm  2.18 cm/m  RA Area:      32.40 cm Kristy Vol (A2C):   156.0 ml 75.50 ml/m RA Volume:   115.00 ml 55.66 ml/m Kristy Vol (A4C):   126.0 ml 60.98 ml/m Kristy Biplane Vol: 145.0 ml 70.18 ml/m  AORTIC VALVE                   PULMONIC VALVE AV Area (Vmax):    1.82 cm    PV Vmax:        0.80 m/s AV Area (Vmean):   1.55 cm    PV Peak grad:   2.5 mmHg AV Area (VTI):     1.73 cm    RVOT Peak grad: 2 mmHg AV Vmax:           99.57 cm/s AV Vmean:          74.233 cm/s AV VTI:            0.181 m AV Peak Grad:      4.0 mmHg AV Mean Grad:      2.7 mmHg LVOT Vmax:         57.60 cm/s LVOT Vmean:        36.600 cm/s LVOT VTI:          0.100 m LVOT/AV VTI ratio: 0.55  AORTA Ao Root diam: 3.00 cm MITRAL VALVE                TRICUSPID VALVE MV Area (PHT): 3.48 cm     TR Peak grad:   43.6 mmHg MV Decel Time: 218 msec     TR Vmax:        330.00 cm/s MV E velocity: 120.00 cm/s                             SHUNTS                             Systemic VTI:  0.10 m                             Systemic Diam: 2.00 cm Kate Sable MD Electronically signed by Kate Sable MD Signature Date/Time: 07/01/2020/3:47:24 PM    Final    Assessment/Plan Kristy Solomon  is a 86 year old female with medical history significant for chronic respiratory failure, chronic diastolic dysfunction CHF, atrial fibrillation on chronic anticoagulation therapy, hypertension with complications of chronic kidney disease and history of malignant lymphoma who presented to the emergency room via EMS for evaluation of worsening shortness of breath.  1.  Acute on chronic renal failure now requiring hemodialysis: Patient with known hx of kidney disease now requiring hemodialysis.  At this time, the patient does not have an adequate access to dialyze.  To allow the patient to dialyze in the inpatient/outpatient setting recommend placement of a PermCath.  Patient with severe congestive heart failure.  Placement will be challenging to do the patient being unable to lie flat.  We will make an  attempt.  Procedure, risks and benefits were explained to the patient.  All questions were answered.  Patient wished to proceed.  2.  Hyperlipidemia: On Statin for medical management. Encouraged good control as its slows the progression of atherosclerotic disease.  3. Anemia: Followed by the patient's nephrologist and primary care.  Discussed with Dr. Mayme Genta, PA-C  07/22/2020 10:11 AM    This note was created with Dragon medical transcription system.  Any error is purely unintentional

## 2020-07-06 NOTE — Consult Note (Signed)
Kristy Solomon  MRN : [id number]  Kristy Solomon is a 86 y.o. ([birth year]) female who presents with chief complaint of  Chief Complaint  Patient presents with  . Shortness of Breath   History of Present Illness:  Kristy Solomon is a 86 year old female with medical history significant for chronic respiratory failure, chronic diastolic dysfunction CHF, atrial fibrillation on chronic anticoagulation therapy, hypertension with complications of chronic kidney disease and history of malignant lymphoma who presented to the emergency room via EMS for evaluation of worsening shortness of breath.    Patient stated that she had had to increase her oxygen to 3 L from 2 L due to her symptoms.  Upon arrival to the emergency room she was noted to be tachycardic and was in rapid A. fib with heart rate of 170bpm.  She received a dose of IV metoprolol 2.5 mg with improvement in her heart rate. Patient complains of worsening shortness of breath and is short of breath at rest associated with orthopnea and bilateral lower extremity swelling.  She also complains of palpitations but denies having any chest pain, no diaphoresis, no nausea, no vomiting, no abdominal pain or any changes in her bowel habits.  Nephrology would like to initiate dialysis at this time however the patient does not have an adequate dialysis access.  Vascular surgery was consulted by Dr. Holley Raring for possible PermCath placement.  Current Facility-Administered Medications  Medication Dose Route Frequency Provider Last Rate Last Admin  . 0.9 %  sodium chloride infusion  250 mL Intravenous PRN Agbata, Tochukwu, MD      . acetaminophen (TYLENOL) tablet 650 mg  650 mg Oral Q4H PRN Agbata, Tochukwu, MD   650 mg at 06/29/20 0948  . albumin human 25 % solution 12.5 g  12.5 g Intravenous Daily Murlean Iba, MD   Stopped at 07/05/20 1200  . allopurinol (ZYLOPRIM) tablet 50 mg  50 mg Oral Daily Agbata,  Tochukwu, MD   50 mg at 07/05/20 0823  . apixaban (ELIQUIS) tablet 2.5 mg  2.5 mg Oral BID Minna Merritts, MD   2.5 mg at 07/05/20 2256  . atorvastatin (LIPITOR) tablet 20 mg  20 mg Oral Daily Agbata, Tochukwu, MD   20 mg at 07/05/20 0821  . Chlorhexidine Gluconate Cloth 2 % PADS 6 each  6 each Topical Q0600 Lateef, Munsoor, MD      . ferrous sulfate tablet 325 mg  325 mg Oral Q breakfast Dwyane Dee, MD   325 mg at 07/05/20 0254  . gabapentin (NEURONTIN) capsule 300 mg  300 mg Oral TID Agbata, Tochukwu, MD   300 mg at 07/05/20 2256  . ipratropium-albuterol (DUONEB) 0.5-2.5 (3) MG/3ML nebulizer solution 3 mL  3 mL Nebulization Q6H PRN Agbata, Tochukwu, MD      . metoprolol succinate (TOPROL-XL) 24 hr tablet 50 mg  50 mg Oral Daily Murlean Iba, MD   50 mg at 07/05/20 0820  . midodrine (PROAMATINE) tablet 10 mg  10 mg Oral TID WC Kate Sable, MD   10 mg at 07/05/20 1810  . ondansetron (ZOFRAN) injection 4 mg  4 mg Intravenous Q6H PRN Agbata, Tochukwu, MD      . polyethylene glycol (MIRALAX / GLYCOLAX) packet 17 g  17 g Oral Daily Dwyane Dee, MD   17 g at 07/04/20 0810  . senna-docusate (Senokot-S) tablet 1 tablet  1 tablet Oral BID Dwyane Dee, MD   1 tablet at 07/05/20 2256  .  sodium chloride flush (NS) 0.9 % injection 3 mL  3 mL Intravenous Q12H Agbata, Tochukwu, MD   3 mL at 07/05/20 2300  . sodium chloride flush (NS) 0.9 % injection 3 mL  3 mL Intravenous PRN Agbata, Tochukwu, MD       Facility-Administered Medications Ordered in Other Encounters  Medication Dose Route Frequency Provider Last Rate Last Admin  . cyanocobalamin ((VITAMIN B-12)) injection 1,000 mcg  1,000 mcg Intramuscular Once Lequita Asal, MD       Past Medical History:  Diagnosis Date  . (HFpEF) heart failure with preserved ejection fraction ([place])    a. 12/2019 Echo: EF 50-55%, no rwma, mild LVH. Nl RV fxn. Mildly BAE. Mild MR, mild-mod TR.  Marland Kitchen Anemia in neoplastic disease 06/27/2014  . Aortic  atherosclerosis ([place])    a. Seen on CT 05/2020.  Marland Kitchen CKD (chronic kidney disease), stage III-IV   . Coronary artery calcification seen on CT scan 05/2020  . COVID-19 virus infection 01/2020  . Hyperlipidemia   . Hypertension   . 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 Permanent atrial fibrillation ([facility])    a. CHA2DS2VASc = 6-->eliquis.   Past Surgical History:  Procedure Laterality Date  . ABDOMINAL HYSTERECTOMY    . HEMORRHOID SURGERY     Social History Social History   Tobacco Use  . Smoking status: Never Smoker  . Smokeless tobacco: Never Used  Vaping Use  . Vaping Use: Never used  Substance Use Topics  . Alcohol use: No  . Drug use: No   Family History Family History  Problem Relation Age of Onset  . Cancer Brother        throat ca  . Cancer Brother        bone cancer  Denies family hx peripheral artery disease, venous disease or renal disease.  No Active Allergies  REVIEW OF SYSTEMS (Negative unless checked)  Constitutional: [] Weight loss  [] Fever  [] Chills Cardiac: [] Chest pain   [] Chest pressure   [] Palpitations   [x] Shortness of breath when laying flat   [x] Shortness of breath at rest   [x] Shortness of breath with exertion. Vascular:  [] Pain in legs with walking   [] Pain in legs at rest   [] Pain in legs when laying flat   [] Claudication   [] Pain in feet when walking  [] Pain in feet at rest  [] Pain in feet when laying flat   [] History of DVT   [] Phlebitis   [x] Swelling in legs   [] Varicose veins   [] Non-healing ulcers Pulmonary:   [] Uses home oxygen   [] Productive cough   [] Hemoptysis   [] Wheeze  [] COPD   [] Asthma Neurologic:  [] Dizziness  [] Blackouts   [] Seizures   [] History of stroke   [] History of TIA  [] Aphasia   [] Temporary blindness   [] Dysphagia   [] Weakness or numbness in arms   [] Weakness or numbness in legs Musculoskeletal:  [] Arthritis   [] Joint swelling   [] Joint pain   [] Low back pain Hematologic:  [] Easy bruising  [] Easy bleeding    [] Hypercoagulable state   [] Anemic  [] Hepatitis Gastrointestinal:  [] Blood in stool   [] Vomiting blood  [] Gastroesophageal reflux/heartburn   [] Difficulty swallowing. Genitourinary:  [x] Chronic kidney disease   [] Difficult urination  [] Frequent urination  [] Burning with urination   [] Blood in urine Skin:  [] Rashes   [] Ulcers   [] Wounds Psychological:  [] History of anxiety   []  History of major depression.  Physical Examination  Vitals:   07/05/20 1622 07/05/20 2031 07/25/2020 8413 07/20/2020 2440  BP: 114/89 (!) 118/92 (!) 156/94 (!) 158/102  Pulse: 81 63 (!) 53 72  Resp: 16 (!) 21 19 (!) 22  Temp: 98.3 F (36.8 C) 98.5 F (36.9 C) 98.5 F (36.9 C) 97.7 F (36.5 C)  TempSrc: Oral Oral Oral Oral  SpO2: 95% 95% 94% 96%  Weight:   97.5 kg   Height:       Body mass index is 35.78 kg/m. Gen:  WD/WN, NAD Head: [place]/AT, No temporalis wasting. Prominent temp pulse not noted. Ear/Nose/Throat: Hearing grossly intact, nares w/o erythema or drainage, oropharynx w/o Erythema/Exudate Eyes: Sclera non-icteric, conjunctiva clear Neck: Trachea midline.  No JVD.  Pulmonary:  Good air movement, respirations not labored, equal bilaterally.  Cardiac: RRR, normal S1, S2. Vascular:  Vessel Right Left  Radial Palpable Palpable  Ulnar Palpable Palpable  Brachial Palpable Palpable  Carotid Palpable, without bruit Palpable, without bruit  Aorta Not palpable N/A  Femoral Palpable Palpable  Popliteal Palpable Palpable  PT Palpable Palpable  DP Palpable Palpable   Gastrointestinal: soft, non-tender/non-distended. No guarding/reflex.  Musculoskeletal: M/S 5/5 throughout.  Extremities without ischemic changes.  No deformity or atrophy. No edema. Neurologic: Sensation grossly intact in extremities.  Symmetrical.  Speech is fluent. Motor exam as listed above. Psychiatric: Judgment intact, Mood & affect appropriate for pt's clinical situation. Dermatologic: No rashes or ulcers noted.  No cellulitis or open  wounds. Lymph : No Cervical, Axillary, or Inguinal lymphadenopathy.  CBC Lab Results  Component Value Date   WBC 5.3 07/03/2020   HGB 11.6 (L) 07/03/2020   HCT 37.6 07/03/2020   MCV 74.3 (L) 07/03/2020   PLT 179 07/03/2020   BMET    Component Value Date/Time   NA 143 07/03/2020 0526   NA 141 05/08/2020 0000   NA 141 07/04/2014 1030   K 4.6 07/13/2020 0526   K 3.8 07/04/2014 1030   CL 101 07/16/2020 0526   CL 104 04/12/2014 0111   CO2 29 07/15/2020 0526   CO2 25 07/04/2014 1030   GLUCOSE 133 (H) 07/05/2020 0526   GLUCOSE 97 07/04/2014 1030   BUN 85 (H) 07/12/2020 0526   BUN 30 (H) 05/08/2020 0000   BUN 30.9 (H) 07/04/2014 1030   CREATININE 3.54 (H) 07/20/2020 0526   CREATININE 1.8 (H) 07/04/2014 1030   CALCIUM 9.3 07/03/2020 0526   CALCIUM 10.2 07/04/2014 1030   GFRNONAA 11 (L) 07/28/2020 0526   GFRNONAA 27 (L) 04/12/2014 0111   GFRAA 13 (L) 07/05/2020 0526   GFRAA 31 (L) 04/12/2014 0111   Estimated Creatinine Clearance: 12.4 mL/min (A) (by C-G formula based on SCr of 3.54 mg/dL (H)).  COAG Lab Results  Component Value Date   INR 1.4 (H) 06/29/2020   INR 1.34 03/18/2016   INR 1.0 02/14/2013   Radiology DG Chest 1 View  Result Date: 06/28/2020 CLINICAL DATA:  COVID-19 positivity with congestive failure EXAM: CHEST  1 VIEW COMPARISON:  06/14/2020 FINDINGS: Cardiac shadow remains enlarged. Aortic calcifications are again seen. Mild vascular congestion is noted although improved from the prior exam. Interval clearing of right-sided effusion and basilar atelectasis is noted. Persistent left basilar opacity is noted and stable. IMPRESSION: Improved aeration particularly in the right lung base. Persistent opacity in the left base is seen. Electronically Signed   By: Inez Catalina M.D.   On: 06/28/2020 14:47   DG Chest 2 View  Result Date: 06/11/2020 CLINICAL DATA:  Shortness of breath EXAM: CHEST - 2 VIEW COMPARISON:  01/24/2020, CT 05/20/2020 FINDINGS: Small  moderate  bilateral pleural effusions left greater than right. Basilar airspace consolidations. Cardiomegaly with vascular congestion. Aortic atherosclerosis. No pneumothorax. IMPRESSION: Cardiomegaly with vascular congestion, small to moderate bilateral pleural effusions left greater than right, and bibasilar airspace consolidations which may reflect atelectasis or pneumonia Electronically Signed   By: Donavan Foil M.D.   On: 06/23/2020 01:54   ECHOCARDIOGRAM COMPLETE  Result Date: 07/01/2020    ECHOCARDIOGRAM REPORT   Patient Name:   Kristy Solomon Date of Exam: 07/01/2020 Medical Rec #:  [id number]        Height:       65.0 in Accession #:    [id number]       Weight:       221.6 lb Date of Birth:  Feb 24, 1929        BSA:          2.066 m Patient Age:    89 years         BP:           116/64 mmHg Patient Gender: F                HR:           83 bpm. Exam Location:  [facility] Procedure: 2D Echo, Cardiac Doppler and Color Doppler Indications:     CHF-acute diastolic 062.69  History:         Patient has prior history of Echocardiogram examinations, most                  recent 12/31/2019. Risk Factors:Dyslipidemia and Hypertension.                  Permanent Atrial fibrillation.  Sonographer:     Sherrie Sport RDCS (AE) Referring Phys:  [phone] CHRISTOPHER END Diagnosing Phys: Kate Sable MD IMPRESSIONS  1. Left ventricular ejection fraction, by estimation, is 20 to 25%. The left ventricle has severely decreased function. The left ventricle demonstrates global hypokinesis. Left ventricular diastolic parameters are indeterminate.  2. Right ventricular systolic function is mildly reduced. The right ventricular size is normal.  3. Left atrial size was severely dilated.  4. Right atrial size was severely dilated.  5. Moderate pleural effusion in the left lateral region.  6. The mitral valve is normal in structure. Mild to moderate mitral valve regurgitation.  7. Tricuspid valve regurgitation is moderate.  8. The aortic valve is  tricuspid. Aortic valve regurgitation is not visualized. FINDINGS  Left Ventricle: Left ventricular ejection fraction, by estimation, is 20 to 25%. The left ventricle has severely decreased function. The left ventricle demonstrates global hypokinesis. The left ventricular internal cavity size was normal in size. There is no left ventricular hypertrophy. Left ventricular diastolic parameters are indeterminate. Right Ventricle: The right ventricular size is normal. Right vetricular wall thickness was not assessed. Right ventricular systolic function is mildly reduced. Left Atrium: Left atrial size was severely dilated. Right Atrium: Right atrial size was severely dilated. Pericardium: There is no evidence of pericardial effusion. Mitral Valve: The mitral valve is normal in structure. Mild to moderate mitral valve regurgitation. Tricuspid Valve: The tricuspid valve is normal in structure. Tricuspid valve regurgitation is moderate. Aortic Valve: The aortic valve is tricuspid. Aortic valve regurgitation is not visualized. Aortic valve mean gradient measures 2.7 mmHg. Aortic valve peak gradient measures 4.0 mmHg. Aortic valve area, by VTI measures 1.73 cm. Pulmonic Valve: The pulmonic valve was normal in structure. Pulmonic valve regurgitation is not visualized. Aorta: The aortic root is  normal in size and structure. Venous: The inferior vena cava was not well visualized. IAS/Shunts: No atrial level shunt detected by color flow Doppler. Additional Comments: There is a moderate pleural effusion in the left lateral region.  LEFT VENTRICLE PLAX 2D LVIDd:         5.21 cm LVIDs:         4.27 cm LV PW:         1.15 cm LV IVS:        0.85 cm LVOT diam:     2.00 cm LV SV:         31 LV SV Index:   15 LVOT Area:     3.14 cm  RIGHT VENTRICLE RV Basal diam:  3.76 cm RV S prime:     10.20 cm/s TAPSE (M-mode): 3.8 cm LEFT ATRIUM              Index       RIGHT ATRIUM           Index LA diam:        4.50 cm  2.18 cm/m  RA Area:      32.40 cm LA Vol (A2C):   156.0 ml 75.50 ml/m RA Volume:   115.00 ml 55.66 ml/m LA Vol (A4C):   126.0 ml 60.98 ml/m LA Biplane Vol: 145.0 ml 70.18 ml/m  AORTIC VALVE                   PULMONIC VALVE AV Area (Vmax):    1.82 cm    PV Vmax:        0.80 m/s AV Area (Vmean):   1.55 cm    PV Peak grad:   2.5 mmHg AV Area (VTI):     1.73 cm    RVOT Peak grad: 2 mmHg AV Vmax:           99.57 cm/s AV Vmean:          74.233 cm/s AV VTI:            0.181 m AV Peak Grad:      4.0 mmHg AV Mean Grad:      2.7 mmHg LVOT Vmax:         57.60 cm/s LVOT Vmean:        36.600 cm/s LVOT VTI:          0.100 m LVOT/AV VTI ratio: 0.55  AORTA Ao Root diam: 3.00 cm MITRAL VALVE                TRICUSPID VALVE MV Area (PHT): 3.48 cm     TR Peak grad:   43.6 mmHg MV Decel Time: 218 msec     TR Vmax:        330.00 cm/s MV E velocity: 120.00 cm/s                             SHUNTS                             Systemic VTI:  0.10 m                             Systemic Diam: 2.00 cm Kate Sable MD Electronically signed by Kate Sable MD Signature Date/Time: 07/01/2020/3:47:24 PM    Final    Assessment/Plan Kristy Solomon  is a 86 year old female with medical history significant for chronic respiratory failure, chronic diastolic dysfunction CHF, atrial fibrillation on chronic anticoagulation therapy, hypertension with complications of chronic kidney disease and history of malignant lymphoma who presented to the emergency room via EMS for evaluation of worsening shortness of breath.  1.  Acute on chronic renal failure now requiring hemodialysis: Patient with known hx of kidney disease now requiring hemodialysis.  At this time, the patient does not have an adequate access to dialyze.  To allow the patient to dialyze in the inpatient/outpatient setting recommend placement of a PermCath.  Patient with severe congestive heart failure.  Placement will be challenging to do the patient being unable to lie flat.  We will make an  attempt.  Procedure, risks and benefits were explained to the patient.  All questions were answered.  Patient wished to proceed.  2.  Hyperlipidemia: On Statin for medical management. Encouraged good control as its slows the progression of atherosclerotic disease.  3. Anemia: Followed by the patient's nephrologist and primary care.  Discussed with Dr. Mayme Genta, PA-C  07/08/2020 10:11 AM    This Solomon was created with Dragon medical transcription system.  Any error is purely unintentional

## 2020-07-06 NOTE — Op Note (Signed)
OPERATIVE NOTE    PRE-OPERATIVE DIAGNOSIS: 1. ESRD   POST-OPERATIVE DIAGNOSIS: same as above  PROCEDURE: 1. Ultrasound guidance for vascular access to the right internal jugular vein 2. Fluoroscopic guidance for placement of catheter 3. Placement of a 19 cm tip to cuff tunneled hemodialysis catheter via the right internal jugular vein  SURGEON: Leotis Pain, MD  ANESTHESIA:  Local with Moderate conscious sedation for approximately 16 minutes using 1 mg of Versed and 25 mcg of Fentanyl  ESTIMATED BLOOD LOSS: 10 cc  FLUORO TIME: less than one minute  CONTRAST: none  FINDING(S): 1.  Patent right internal jugular vein  SPECIMEN(S):  None  INDICATIONS:   Kristy Solomon is a 86 y.o.female who presents with renal failure.  The patient needs long term dialysis access for their ESRD, and a Permcath is necessary.  Risks and benefits are discussed and informed consent is obtained.    DESCRIPTION: After obtaining full informed written consent, the patient was brought back to the vascular suited. The patient's right neck and chest were sterilely prepped and draped in a sterile surgical field was created. Moderate conscious sedation was administered during a face to face encounter with the patient throughout the procedure with my supervision of the RN administering medicines and monitoring the patient's vital signs, pulse oximetry, telemetry and mental status throughout from the start of the procedure until the patient was taken to the recovery room.  The right internal jugular vein was visualized with ultrasound and found to be patent. It was then accessed under direct ultrasound guidance and a permanent image was recorded. A wire was placed. After skin nick and dilatation, the peel-away sheath was placed over the wire. I then turned my attention to an area under the clavicle. Approximately 1-2 fingerbreadths below the clavicle a small counterincision was created and tunneled from the  subclavicular incision to the access site. Using fluoroscopic guidance, a 19 centimeter tip to cuff tunneled hemodialysis catheter was selected, and tunneled from the subclavicular incision to the access site. It was then placed through the peel-away sheath and the peel-away sheath was removed. Using fluoroscopic guidance the catheter tips were parked in the right atrium. The appropriate distal connectors were placed. It withdrew blood well and flushed easily with heparinized saline and a concentrated heparin solution was then placed. It was secured to the chest wall with 2 Prolene sutures. The access incision was closed single 4-0 Monocryl. A 4-0 Monocryl pursestring suture was placed around the exit site. Sterile dressings were placed. The patient tolerated the procedure well and was taken to the recovery room in stable condition.  COMPLICATIONS: None  CONDITION: Stable  Leotis Pain, MD 07/08/2020 3:56 PM   This note was created with Dragon Medical transcription system. Any errors in dictation are purely unintentional.

## 2020-07-06 NOTE — Care Management Important Message (Signed)
Important Message  Patient Details  Name: Kristy Solomon MRN: [id number] Date of Birth: Feb 07, 1929   Medicare Important Message Given:  Yes     [name] 07/21/2020, 1:07 PM

## 2020-07-06 NOTE — Progress Notes (Signed)
OT Cancellation Note  Patient Details Name: Kristy Solomon MRN: [id number] DOB: 08/31/1929   Cancelled Treatment:    Reason Eval/Treat Not Completed: Patient at procedure or test/ unavailable. Pt off the floor for perm cath placement today. OT will follow up as available and appropriate.   Darleen Crocker, [place], OTR/L , [facility] ascom [phone]  07/30/2020, 3:39 PM  07/04/2020, 3:38 PM

## 2020-07-06 NOTE — Progress Notes (Signed)
PROGRESS NOTE    Kristy Solomon  UMP:[id number] DOB: [birth year] DOA: 06/20/2020 PCP: Jodi Marble, MD   Brief Narrative:  Kristy Solomon is an 86 yo AA female with PMH chronic respiratory failure (on 2L  O2), HFpEF, permanent afib (on Xarelto), HTN, CKDIV HLD, hx lymphoma, anemia of chronic disease who presented to the hospital with worsening SOB. She had been out of her medications at home for approx a couple weeks.  She was found to be volume overloaded and in afib with RVR. Nephrology and cardiology were consulted on admission.  A CXR was obtained which revealed bilateral pleural effusions and pulmonary edema.  She was started on IV Lasix and resumed back on her beta-blocker. Patient was not responding well to diuresis, becoming more oliguric. Started on Lasix infusion along with midodrine to see if she responds. Patient started diuresing with improved urinary output but worsening creatinine, Lasix infusion was discontinued due to worsening creatinine.  Patient is not a good candidate for dialysis but wants to try it.  She does not want to give up and remain full code. Nephrology decided to place HD catheter tomorrow to give her a trial of dialysis. Palliative care was consulted.  Subjective: Patient has no new complaint today.  Waiting for HD catheter to be placed later in the day.  Again discussed whether she wants dialysis, she confirmed that she wants to give it a trial.  Assessment & Plan:   Principal Problem:   Acute on chronic combined systolic (congestive) and diastolic (congestive) heart failure (HCC) Active Problems:   Waldenstrom macroglobulinemia (HCC)   Thrombocytopenia (HCC)   HTN (hypertension)   GERD (gastroesophageal reflux disease)   CKD (chronic kidney disease), stage IV (HCC)   Atrial fibrillation with rapid ventricular response (HCC)   Non compliance w medication regimen   Permanent atrial fibrillation (HCC)  Acute on chronic heart failure with  preserved ejection fraction (HFpEF) ([place]).  Cardiology is following-appreciate their recommendation. Initially treated with IV Lasix and then it was held for couple of days due to worsening renal function.  Home dose of torsemide was restarted by cardiology, IV Lasix at an higher dose was resumed due to poor urinary output. Patient remained oliguric, just made 400 cc over the past 24-hour with IV Lasix of 80 mg twice daily.  Softer blood pressure.   -She was started on Lasix infusion along with midodrine on 06/29/20, improved diuresis but creatinine continue to get worse and Lasix infusion was discontinued.  Remains volume up.  Per cardiology she might not tolerate dialysis but patient wants to give it a trial. Albumin was also added for oncotic support. -Metoprolol dose was decreased to 75 by cardiology. -Continue with strict intake and output and daily weight. -PT were initially recommending home health services which were ordered but due to persistent decline, reevaluation obtained and they are recommending SNF. -Palliative care was consulted to discuss goals of care.  Hypotension.  Intermittently becoming hypotensive.  There was a huge difference between blood pressure measured on right and left upper extremities.  Right systolic mostly in the 40G with left systolic in 86P and 61P.  No prior diagnosis of coarctation or subclavian stenosis. -Continue midodrine 5 mg 3 times a day.  Atrial fibrillation with rapid ventricular response ([place]).  Most likely secondary to being out of medications at home.  Currently rate well controlled. -Continue home dose of Toprol. -Xarelto was discontinued due to worsening renal function and she was started on heparin infusion. -Eliquis  intolerance was listed in her chart-later able to transition her to renally dose Eliquis.  GERD (gastroesophageal reflux disease) - continue PPI  HTN (hypertension).  Blood pressure within goal today. Keep holding home  antihypertensives as patient is on midodrine now.  AKI with CKD stage IV.  Most likely secondary to cardiorenal syndrome.  Baseline creatinine around 1.7 per nephrology. Nephrology is following-appreciate their recommendations. Worsening creatinine.  Remains volume overload. Patient decided to try dialysis-HD catheter to be placed today followed by first dialysis. -Continue to monitor. -Avoid nephrotoxins  Thrombocytopenia ([place]).  Seems stable and improving. - patient has mild chronic thrombocytopenia at baseline ~120-140k - etiology due to her underlying Waldenstrom's macroglobulinemia and effect from ibrutinib; follows with oncology - okay to continue xarelto for now   Waldenstrom macroglobulinemia [facility]) - follows with oncology; last OV 04/23/20 - continue ibrutinib   Objective: Vitals:   07/31/2020 0352 07/12/2020 0834 07/10/2020 1149 07/29/2020 1429  BP: (!) 156/94 (!) 158/102 (!) 131/94 (!) 120/103  Pulse: (!) 53 72 (!) 49 (!) 122  Resp: 19 (!) 22 17 18   Temp: 98.5 F (36.9 C) 97.7 F (36.5 C) 98.7 F (37.1 C) 97.6 F (36.4 C)  TempSrc: Oral Oral  Oral  SpO2: 94% 96% 100% 97%  Weight: 97.5 kg     Height:        Intake/Output Summary (Last 24 hours) at 07/14/2020 1507 Last data filed at 07/03/2020 0354 Gross per 24 hour  Intake --  Output 950 ml  Net -950 ml   Filed Weights   07/04/20 0500 07/05/20 0400 07/08/2020 0352  Weight: 99.9 kg 99.1 kg 97.5 kg    Examination:  General.  Chronically ill-appearing elderly lady, in no acute distress. Pulmonary.  Lungs clear bilaterally, normal respiratory effort. CV.  Regular rate and rhythm, no JVD, rub or murmur. Abdomen.  Soft, nontender, nondistended, BS positive. CNS.  Alert and oriented x3.  No focal neurologic deficit. Extremities.  2+LE edema, no cyanosis, pulses intact and symmetrical. Psychiatry.  Judgment and insight appears normal.  DVT prophylaxis: Eliquis Code Status: Full Family Communication: No family at  bedside, daughter was updated on phone, she was very upset with patient's decision regarding starting dialysis. Disposition Plan:  Status is: Inpatient  Remains inpatient appropriate because:Inpatient level of care appropriate due to severity of illness   Dispo: The patient is from: Home              Anticipated d/c is to: Home              Anticipated d/c date is: 2-3 days.              Patient currently is not medically stable to d/c.  Patient is high risk for readmission, deterioration and death.  Remains volume overload.  History of being noncompliant.  Palliative care was consulted.  Patient wants to try dialysis before deciding about going with hospice.  Consultants:   Cardiology  Nephrology  Palliative care  Procedures:  Antimicrobials:   Data Reviewed: I have personally reviewed following labs and imaging studies  CBC: Recent Labs  Lab 06/30/20 0320 07/01/20 0641 07/02/20 0427 07/03/20 0456  WBC 4.5 5.0 4.2 5.3  HGB 11.1* 10.9* 11.0* 11.6*  HCT 38.7 37.4 36.9 37.6  MCV 76.2* 75.9* 75.2* 74.3*  PLT 157 170 181 409   Basic Metabolic Panel: Recent Labs  Lab 07/02/20 0427 07/03/20 0456 07/04/20 0728 07/05/20 0328 07/16/2020 0526  NA 140 138 137 141 143  K  4.3 4.3 4.4 4.1 4.6  CL 100 99 99 100 101  CO2 27 29 28 28 29   GLUCOSE 96 96 97 113* 133*  BUN 80* 82* 90* 90* 85*  CREATININE 3.86* 4.04* 4.43* 4.09* 3.54*  CALCIUM 8.8* 8.9 9.0 9.1 9.3  PHOS 4.7* 5.7* 5.4* 4.9* 4.9*   GFR: Estimated Creatinine Clearance: 12.4 mL/min (A) (by C-G formula based on SCr of 3.54 mg/dL (H)). Liver Function Tests: Recent Labs  Lab 07/02/20 0427 07/03/20 0456 07/04/20 0728 07/05/20 0328 07/08/2020 0526  ALBUMIN 2.9* 3.1* 2.9* 3.5 3.5   No results for input(s): LIPASE, AMYLASE in the last 168 hours. No results for input(s): AMMONIA in the last 168 hours. Coagulation Profile: Recent Labs  Lab 06/29/20 1626  INR 1.4*   Cardiac Enzymes: No results for input(s):  CKTOTAL, CKMB, CKMBINDEX, TROPONINI in the last 168 hours. BNP (last 3 results) No results for input(s): PROBNP in the last 8760 hours. HbA1C: No results for input(s): HGBA1C in the last 72 hours. CBG: No results for input(s): GLUCAP in the last 168 hours. Lipid Profile: No results for input(s): CHOL, HDL, LDLCALC, TRIG, CHOLHDL, LDLDIRECT in the last 72 hours. Thyroid Function Tests: No results for input(s): TSH, T4TOTAL, FREET4, T3FREE, THYROIDAB in the last 72 hours. Anemia Panel: No results for input(s): VITAMINB12, FOLATE, FERRITIN, TIBC, IRON, RETICCTPCT in the last 72 hours. Sepsis Labs: No results for input(s): PROCALCITON, LATICACIDVEN in the last 168 hours.  No results found for this or any previous visit (from the past 240 hour(s)).   Radiology Studies: No results found.  Scheduled Meds: . [MAR Hold] allopurinol  50 mg Oral Daily  . [MAR Hold] apixaban  2.5 mg Oral BID  . [MAR Hold] atorvastatin  20 mg Oral Daily  . [MAR Hold] Chlorhexidine Gluconate Cloth  6 each Topical Q0600  . [MAR Hold] ferrous sulfate  325 mg Oral Q breakfast  . [MAR Hold] gabapentin  300 mg Oral TID  . [MAR Hold] metoprolol succinate  50 mg Oral Daily  . [MAR Hold] midodrine  10 mg Oral TID WC  . [MAR Hold] polyethylene glycol  17 g Oral Daily  . [MAR Hold] senna-docusate  1 tablet Oral BID  . [MAR Hold] sodium chloride flush  3 mL Intravenous Q12H   Continuous Infusions: . [MAR Hold] sodium chloride    . sodium chloride 10 mL/hr at 07/29/2020 1445  . [MAR Hold] albumin human Stopped (07/05/20 1200)  . [START ON 07/07/2020]  ceFAZolin (ANCEF) IV       LOS: 17 days   Time spent: 30 minutes.  Lorella Nimrod, MD [facility]  If 7PM-7AM, please contact night-coverage [email or web address]  07/27/2020, 3:07 PM   This record has been created using Systems analyst. Errors have been sought and corrected,but may not always be located. Such creation errors do not reflect on the  standard of care.

## 2020-07-06 NOTE — Progress Notes (Signed)
[place], [place] 07/03/2020  Subjective:   Hospital day # 17  Patient found resting in bed, in no acute distress, waiting for permacath placement today.  Will plan dialyzing for a short sesstion after the access placement.   Renal: 10/03 0701 - 10/04 0700 In: -  Out: 950 [Urine:950] Lab Results  Component Value Date   CREATININE 3.54 (H) 07/04/2020   CREATININE 4.09 (H) 07/05/2020   CREATININE 4.43 (H) 07/04/2020     Objective:  Vital signs in last 24 hours:  Temp:  [97.6 F (36.4 C)-98.7 F (37.1 C)] 97.6 F (36.4 C) (10/04 1429) Pulse Rate:  [49-122] 122 (10/04 1429) Resp:  [16-22] 18 (10/04 1429) BP: (114-158)/(89-103) 120/103 (10/04 1429) SpO2:  [94 %-100 %] 98 % (10/04 1505) Weight:  [97.5 kg] 97.5 kg (10/04 0352)  Weight change: -1.588 kg Filed Weights   07/04/20 0500 07/05/20 0400 07/21/2020 0352  Weight: 99.9 kg 99.1 kg 97.5 kg    Intake/Output:    Intake/Output Summary (Last 24 hours) at 07/22/2020 1519 Last data filed at 07/08/2020 0354 Gross per 24 hour  Intake --  Output 950 ml  Net -950 ml     Physical Exam: General:  Resting in bed,calm and cooperative  HEENT  Normocephalic, atraumatic  Pulm/lungs  respiration even and unlabored, lungs diminished bilaterally at the bases  CVS/Heart  S1S2, irregular rhythm  Abdomen:   Soft, obese, nondistended  Extremities:  Trace peripheral edema  Neurologic:  Alert, oriented, speech clear and appropriate  Skin:  No acute lesions or rashes    Basic Metabolic Panel:  Recent Labs  Lab 07/02/20 0427 07/02/20 0427 07/03/20 0456 07/03/20 0456 07/04/20 0728 07/05/20 0328 07/28/2020 0526  NA 140  --  138  --  137 141 143  K 4.3  --  4.3  --  4.4 4.1 4.6  CL 100  --  99  --  99 100 101  CO2 27  --  29  --  28 28 29   GLUCOSE 96  --  96  --  97 113* 133*  BUN 80*  --  82*  --  90* 90* 85*  CREATININE 3.86*  --  4.04*  --  4.43* 4.09* 3.54*  CALCIUM 8.8*   < > 8.9   < > 9.0 9.1  9.3  PHOS 4.7*  --  5.7*  --  5.4* 4.9* 4.9*   < > = values in this interval not displayed.     CBC: Recent Labs  Lab 06/30/20 0320 07/01/20 0641 07/02/20 0427 07/03/20 0456  WBC 4.5 5.0 4.2 5.3  HGB 11.1* 10.9* 11.0* 11.6*  HCT 38.7 37.4 36.9 37.6  MCV 76.2* 75.9* 75.2* 74.3*  PLT 157 170 181 179      Lab Results  Component Value Date   HEPBSAG Negative 09/11/2018      Microbiology:  No results found for this or any previous visit (from the past 240 hour(s)).  Coagulation Studies: No results for input(s): LABPROT, INR in the last 72 hours.  Urinalysis: No results for input(s): COLORURINE, LABSPEC, PHURINE, GLUCOSEU, HGBUR, BILIRUBINUR, KETONESUR, PROTEINUR, UROBILINOGEN, NITRITE, LEUKOCYTESUR in the last 72 hours.  Invalid input(s): APPERANCEUR    Imaging: No results found.   Medications:   . [MAR Hold] sodium chloride    . sodium chloride 10 mL/hr at 07/19/2020 1445  . [MAR Hold] albumin human Stopped (07/05/20 1200)  . [START ON 07/07/2020]  ceFAZolin (ANCEF) IV 1 g (07/24/2020 1517)   . [  MAR Hold] allopurinol  50 mg Oral Daily  . [MAR Hold] apixaban  2.5 mg Oral BID  . [MAR Hold] atorvastatin  20 mg Oral Daily  . [MAR Hold] Chlorhexidine Gluconate Cloth  6 each Topical Q0600  . [MAR Hold] ferrous sulfate  325 mg Oral Q breakfast  . [MAR Hold] gabapentin  300 mg Oral TID  . [MAR Hold] metoprolol succinate  50 mg Oral Daily  . [MAR Hold] midodrine  10 mg Oral TID WC  . [MAR Hold] polyethylene glycol  17 g Oral Daily  . [MAR Hold] senna-docusate  1 tablet Oral BID  . [MAR Hold] sodium chloride flush  3 mL Intravenous Q12H   [MAR Hold] sodium chloride, [MAR Hold] acetaminophen, diphenhydrAMINE, famotidine, fentaNYL, HYDROmorphone (DILAUDID) injection, [MAR Hold] ipratropium-albuterol, methylPREDNISolone (SOLU-MEDROL) injection, midazolam, midazolam, [MAR Hold] ondansetron (ZOFRAN) IV, ondansetron (ZOFRAN) IV, [MAR Hold] sodium chloride flush  Assessment/  Plan:  86 y.o. female with congestive heart failure, hypertension, atrial fibrillation, hyperlipidemia, history of lymphoma admitted on 06/25/2020 for Hypoxia [R09.02] Elevated TSH [R79.89] Acute CHF (congestive heart failure) (HCC) [I50.9] Acute on chronic congestive heart failure, unspecified heart failure type ([place]) [I50.9]   #Acute kidney injury with volume overload #Chronic kidney disease stage IV.  Baseline creatinine 1.75 from May 08, 2020.  GFR 26-30.  Underlying CKD likely secondary to atherosclerosis, advanced age and hypertension  AKI likely secondary to cardiorenal syndrome Lab Results  Component Value Date   CREATININE 3.54 (H) 07/20/2020   CREATININE 4.09 (H) 07/05/2020   CREATININE 4.43 (H) 07/04/2020  Patient is waiting for permacath placement and dialysis after that. We will continue monitoring renal function  #Hypotension, chronic Blood pressure readings within acceptable range We will continue to provide midodrine support.   May consider albumin if required         LOS: [place] 10/4/20213:19 PM  [facility] [place], [place]

## 2020-07-06 NOTE — Interval H&P Note (Signed)
History and Physical Interval Note:  07/13/2020 2:57 PM  Kristy Solomon  has presented today for surgery, with the diagnosis of ESRD.  The various methods of treatment have been discussed with the patient and family. After consideration of risks, benefits and other options for treatment, the patient has consented to  Procedure(s): DIALYSIS/PERMA CATHETER INSERTION (N/A) as a surgical intervention.  The patient's history has been reviewed, patient examined, no change in status, stable for surgery.  I have reviewed the patient's chart and labs.  Questions were answered to the patient's satisfaction.     Leotis Pain

## 2020-07-06 NOTE — Progress Notes (Signed)
Mobility Specialist - Progress Note   07/07/2020 1406  Mobility  Activity Contraindicated/medical hold  Mobility performed by Mobility specialist    Pt currently out of room for procedure. Will hold and re-attempt session at a later date/time when pt is available and appropriate.    Kyliyah Stirn Mobility Specialist  07/13/2020, 2:07 PM

## 2020-07-06 NOTE — Progress Notes (Signed)
Physical Therapy Treatment Patient Details Name: Kristy Solomon MRN: [id number] DOB: 1929/09/22 Today's Date: 07/14/2020    History of Present Illness Kristy Solomon is an 86 y/o female who was admitted for worsening SOB with an increase in O2 to 3L due to symptoms. In ED, pt was tachycardic and in rapid A-fib w/ HR to 170. PMH includes chronic respiratory failure on 2L O2 continuous, chrinic diastolic dysfunction, A-fib on chronic anticoagulation therapy, HTN with complications of CKD III-IV, covid-19 virus infection April 2021, and history of malignant lymphoma.    PT Comments    Patient is making progress towards meeting functional goals. Patient agreeable to PT and able to follow single step commands without difficulty. Patient needs Mod A for bed mobility. Multiple transfers performed. Mod A required for standing from bed and Min A for standing from bed side commode. Verbal cues for technique and safety. Patient ambulated 3 ft with rolling walker with Min A for steadying and cues for safety and rolling walker negotiation. Patient participated in strengthening therapeutic exercises for BLE while seated in chair. Discharge plan remains appropriate for SNF. Recommend to continue PT to maximize functional independence.        Follow Up Recommendations  SNF     Equipment Recommendations  None recommended by PT    Recommendations for Other Services       Precautions / Restrictions Precautions Precautions: Fall Restrictions Weight Bearing Restrictions: No    Mobility  Bed Mobility Overal bed mobility: Needs Assistance Bed Mobility: Supine to Sit Rolling: Mod assist         General bed mobility comments: verbal cues for sequencing and technique. assistance for LE and trunk support. increased time required to complete tasks   Transfers Overall transfer level: Needs assistance Equipment used: Rolling walker (2 wheeled) Transfers: Sit to/from [facility] Sit  to Stand: Mod assist Stand pivot transfers: Mod assist       General transfer comment: Mod for standing from bed and Min A for standing from bed side commode. verbal cues for safety and technique, hand placement. multiple standing bouts performed during session   Ambulation/Gait Ambulation/Gait assistance: Min assist Gait Distance (Feet): 3 Feet Assistive device: Rolling walker (2 wheeled) Gait Pattern/deviations: Wide base of support     General Gait Details: decreased foot clearance bilaterally with ambulation. steadying assistance provided. verbal cues for technique and negotiation of rolling walker    Stairs             Wheelchair Mobility    Modified Rankin (Stroke Patients Only)       Balance Overall balance assessment: Needs assistance Sitting-balance support: Feet supported;Bilateral upper extremity supported Sitting balance-Leahy Scale: Good     Standing balance support: Bilateral upper extremity supported;During functional activity Standing balance-Leahy Scale: Fair Standing balance comment: Min A- CGA provided for safety with rolling walker for UE support                             Cognition Arousal/Alertness: Awake/alert Behavior During Therapy: WFL for tasks assessed/performed Overall Cognitive Status: Difficult to assess Area of Impairment: Orientation                 Orientation Level: Disoriented to;Place;Situation Current Attention Level: Focused   Following Commands: Follows one step commands consistently              Exercises General Exercises - Lower Extremity Ankle Circles/Pumps: AROM;Strengthening;Both;10 reps;Seated Long Arc  Quad: AROM;Strengthening;Both;10 reps;Seated Hip Flexion/Marching: AROM;Strengthening;Both;10 reps;Seated Toe Raises: AROM;Strengthening;Both;10 reps;Seated Other Exercises Other Exercises: verbal and visual cues for technique     General Comments        Pertinent Vitals/Pain Pain  Assessment: No/denies pain    Home Living                      Prior Function            PT Goals (current goals can now be found in the care plan section) Acute Rehab PT Goals Patient Stated Goal: to get better  PT Goal Formulation: With patient Time For Goal Achievement: 07/20/20 Potential to Achieve Goals: Good Progress towards PT goals: Progressing toward goals    Frequency    Min 2X/week      PT Plan Current plan remains appropriate;Other (comment) (date for goal achievement has been updated )    Co-evaluation              AM-PAC PT "6 Clicks" Mobility   Outcome Measure  Help needed turning from your back to your side while in a flat bed without using bedrails?: A Lot Help needed moving from lying on your back to sitting on the side of a flat bed without using bedrails?: A Lot Help needed moving to and from a bed to a chair (including a wheelchair)?: A Little Help needed standing up from a chair using your arms (e.g., wheelchair or bedside chair)?: A Little Help needed to walk in hospital room?: A Little Help needed climbing 3-5 steps with a railing? : A Lot 6 Click Score: 15    End of Session Equipment Utilized During Treatment: Oxygen Activity Tolerance: Patient tolerated treatment well Patient left: in chair;with call bell/phone within reach;with chair alarm set Nurse Communication: Mobility status PT Visit Diagnosis: Unsteadiness on feet (R26.81);Other abnormalities of gait and mobility (R26.89);Muscle weakness (generalized) (M62.81)     Time: 1694-5038 PT Time Calculation (min) (ACUTE ONLY): 38 min  Charges:  $Therapeutic Exercise: 8-22 mins $Therapeutic Activity: 23-37 mins                    Minna Merritts, PT, MPT    Percell Locus 07/08/2020, 9:38 AM

## 2020-07-06 NOTE — Plan of Care (Signed)
VSS. No new complaints or changes. Will continue to monitor.  Problem: Education: Goal: Knowledge of General Education information will improve Description: Including pain rating scale, medication(s)/side effects and non-pharmacologic comfort measures Outcome: Progressing   Problem: Health Behavior/Discharge Planning: Goal: Ability to manage health-related needs will improve Outcome: Progressing   Problem: Clinical Measurements: Goal: Ability to maintain clinical measurements within normal limits will improve Outcome: Progressing Goal: Will remain free from infection Outcome: Progressing Goal: Diagnostic test results will improve Outcome: Progressing Goal: Respiratory complications will improve Outcome: Progressing Goal: Cardiovascular complication will be avoided Outcome: Progressing   Problem: Activity: Goal: Risk for activity intolerance will decrease Outcome: Progressing   Problem: Nutrition: Goal: Adequate nutrition will be maintained Outcome: Progressing   Problem: Coping: Goal: Level of anxiety will decrease Outcome: Progressing   Problem: Elimination: Goal: Will not experience complications related to bowel motility Outcome: Progressing Goal: Will not experience complications related to urinary retention Outcome: Progressing   Problem: Pain Managment: Goal: General experience of comfort will improve Outcome: Progressing   Problem: Safety: Goal: Ability to remain free from injury will improve Outcome: Progressing   Problem: Skin Integrity: Goal: Risk for impaired skin integrity will decrease Outcome: Progressing   Problem: Education: Goal: Ability to demonstrate management of disease process will improve Outcome: Progressing Goal: Ability to verbalize understanding of medication therapies will improve Outcome: Progressing Goal: Individualized Educational Video(s) Outcome: Progressing   Problem: Activity: Goal: Capacity to carry out activities will  improve Outcome: Progressing   Problem: Cardiac: Goal: Ability to achieve and maintain adequate cardiopulmonary perfusion will improve Outcome: Progressing

## 2020-07-07 ENCOUNTER — Encounter: Payer: Self-pay | Admitting: Vascular Surgery

## 2020-07-07 DIAGNOSIS — N179 Acute kidney failure, unspecified: Secondary | ICD-10-CM | POA: Diagnosis not present

## 2020-07-07 DIAGNOSIS — I5043 Acute on chronic combined systolic (congestive) and diastolic (congestive) heart failure: Secondary | ICD-10-CM | POA: Diagnosis not present

## 2020-07-07 DIAGNOSIS — N184 Chronic kidney disease, stage 4 (severe): Secondary | ICD-10-CM | POA: Diagnosis not present

## 2020-07-07 DIAGNOSIS — I509 Heart failure, unspecified: Secondary | ICD-10-CM | POA: Diagnosis not present

## 2020-07-07 DIAGNOSIS — I10 Essential (primary) hypertension: Secondary | ICD-10-CM | POA: Diagnosis not present

## 2020-07-07 DIAGNOSIS — I4891 Unspecified atrial fibrillation: Secondary | ICD-10-CM | POA: Diagnosis not present

## 2020-07-07 DIAGNOSIS — I4821 Permanent atrial fibrillation: Secondary | ICD-10-CM | POA: Diagnosis not present

## 2020-07-07 LAB — HEPATITIS PANEL, ACUTE
HCV Ab: NONREACTIVE
Hep A IgM: NONREACTIVE
Hep B C IgM: NONREACTIVE
Hepatitis B Surface Ag: NONREACTIVE

## 2020-07-07 LAB — QUANTIFERON-TB GOLD PLUS: QuantiFERON-TB Gold Plus: NEGATIVE

## 2020-07-07 LAB — QUANTIFERON-TB GOLD PLUS (RQFGPL)
QuantiFERON Mitogen Value: 0.95 IU/mL
QuantiFERON Nil Value: 0.05 IU/mL
QuantiFERON TB1 Ag Value: 0.08 IU/mL
QuantiFERON TB2 Ag Value: 0.08 IU/mL

## 2020-07-07 LAB — RENAL FUNCTION PANEL
Albumin: 3.5 g/dL (ref 3.5–5.0)
Anion gap: 16 — ABNORMAL HIGH (ref 5–15)
BUN: 78 mg/dL — ABNORMAL HIGH (ref 8–23)
CO2: 24 mmol/L (ref 22–32)
Calcium: 9.2 mg/dL (ref 8.9–10.3)
Chloride: 101 mmol/L (ref 98–111)
Creatinine, Ser: 3.13 mg/dL — ABNORMAL HIGH (ref 0.44–1.00)
GFR calc Af Amer: 15 mL/min — ABNORMAL LOW (ref >60)
GFR calc non Af Amer: 13 mL/min — ABNORMAL LOW (ref >60)
Glucose, Bld: 138 mg/dL — ABNORMAL HIGH (ref 70–99)
Phosphorus: 5.1 mg/dL — ABNORMAL HIGH (ref 2.5–4.6)
Potassium: 4.7 mmol/L (ref 3.5–5.1)
Sodium: 141 mmol/L (ref 135–145)

## 2020-07-07 LAB — CBC
HCT: 35.5 % — ABNORMAL LOW (ref 36.0–46.0)
Hemoglobin: 10.8 g/dL — ABNORMAL LOW (ref 12.0–15.0)
MCH: 22.4 pg — ABNORMAL LOW (ref 26.0–34.0)
MCHC: 30.4 g/dL (ref 30.0–36.0)
MCV: 73.7 fL — ABNORMAL LOW (ref 80.0–100.0)
Platelets: 138 10*3/uL — ABNORMAL LOW (ref 150–400)
RBC: 4.82 MIL/uL (ref 3.87–5.11)
RDW: 21.2 % — ABNORMAL HIGH (ref 11.5–15.5)
WBC: 8.6 10*3/uL (ref 4.0–10.5)
nRBC: 0 % (ref 0.0–0.2)

## 2020-07-07 LAB — HEPATITIS B CORE ANTIBODY, TOTAL: Hep B Core Total Ab: NONREACTIVE

## 2020-07-07 MED ORDER — METOPROLOL TARTRATE 5 MG/5ML IV SOLN
5.0000 mg | Freq: Once | INTRAVENOUS | Status: AC
  Administered 2020-07-07: 5 mg via INTRAVENOUS
  Filled 2020-07-07: qty 5

## 2020-07-07 MED ORDER — NEPRO/CARBSTEADY PO LIQD: 237.0000 mL | Freq: Two times a day (BID) | ORAL | Status: DC

## 2020-07-07 MED ORDER — RENA-VITE PO TABS
1.0000 | ORAL_TABLET | Freq: Every day | ORAL | Status: DC
  Administered 2020-07-07: 1 via ORAL
  Filled 2020-07-07 (×2): qty 1

## 2020-07-07 NOTE — Progress Notes (Signed)
Ch visited with Pt and Pt's daughter Kristy Solomon in response to OR for AD and support. Pt not in a position to complete AD at this time; Pt was having hallucinations in the initial part of the visit. Pt was able to converse coherently with Ch after a while, and expressed that her body cannot handle dialysis. Ch informed her that she will check with MD, and RN about what was going on. Pt requested for prayer; Ch prayed with them. Kristy Solomon says that her mother (Pt) is not in a position to make decisions because she is more incoherent than coherent. Kristy Solomon wishes that she get hospice care, after Ch informed her that AD completion is not possible given her unpredictable mental state. RN says that Pt made a choice to get dialysis, and is denying more dialysis now. Ch met with Hospice representative Santiago Glad, and Palliative care practitioner Shae, to understand process. Ch will message MD about progress. Kristy Solomon tearful throughout the process; Ch provided support to her. Ch will follow up with Pt when she knows more.

## 2020-07-07 NOTE — Progress Notes (Signed)
Daily Progress Note   Patient Name: Kristy Solomon       Date: 07/07/2020 DOB: Dec 13, 1928  Age: 86 y.o. MRN#: [id number] Attending Physician: Lorella Nimrod, MD Primary Care Physician: Jodi Marble, MD Admit Date: 06/07/2020  Reason for Consultation/Follow-up: Establishing goals of care  Subjective: Patient resting in bed. Easily aroused on verbal stimuli. Alert and oriented x3 however with obvious intermittent confusion and hallucinations as she continues to reach for things that are not there and state things are in her room that are not present. She reports "she does not want to continue with dialysis and that she is tired and doesn't think she would want to do that more than once!" No family at the bedside.   I called and spoke to patient's daughter, Marzetta Merino. Updates provided. Daughter tearful expressing her mother's wishes not to continue with dialysis. [name] reports she agrees with her mother because she does not like seeing her in her current state of health and does not think she will tolerate well. [name] mentions that she most likely would not have allowed her mother to initiate dialysis however, she was able to make her own decisions at that point.   I discussed at length in the setting of discontinuing dialysis recommendations for hospice would be most appropriate. Detailed education on aggressive medical interventions vs. Comfort care and what care would look like. [name] verbalized understanding.   She shares her mother's appetite remains poor. We discussed patient's current quality of life. [name] agrees that keeping patient as the center of all decisions is what is most important to her and family. They would not wish to see her suffer. We discussed artificial feeding in the setting patient could not sustain the appropriate nutrition. Daughter verbalizes she would not want to prolong her mother's life with artificial feedings/peg tube. She understands that patient's  appetite may remain poor and in the setting of comfort allowing to eat for comfort versus focusing on nutrition would be most important.   I created space and opportunity to discuss patient's full code status with consideration to her current illness, co-morbidities, and expressed wishes to discontinue dialysis. [name] reports patient has always told her to do what she needed to keep her alive. I explained how this contradicts her current expressed wishes to be allowed to rest and also [name]'s allowing her mother to be comfortable. I discussed patient's poor chances of a meaningful recovery or survival in an event requiring life-sustaining measures. [name] verbalized understanding expressing she would need to think further and would also like to hear from the Nephrologist to confirm hospice would be the only option if no dialysis.  Education provided on hospice at home and hospice at a residential hospice facility. Daughter verbalizes her understanding of [facility] home location and appreciation of their care with past family and friends experiences. She reports she was told that patient could possibly go to a skilled facility with hospice support and that is what she would like as family is unable to care for her. [name] reports she is patient's primary caregiver as her siblings are not involved in care. [name] reports she is unable to care for patient and struggled prior to admission although she lived alone she recently required more care and support. Daughter is open to hospice facility however, expresses she would like to speak with Nephrologist before making final decisions.   Encouraged daughter to continue ongoing discussions regarding code status and transitioning care to focus on comfort in the  setting of discontinuing dialysis.   Length of Stay: 18 days  Vital Signs: BP 116/71 (BP Location: Left Arm)   Pulse 64   Temp 98.6 F (37 C) (Oral)   Resp 18   Ht 5\' 5"  (1.651 m)   Wt 98.1 kg    SpO2 100%   BMI 35.98 kg/m  SpO2: SpO2: 100 % O2 Device: O2 Device: Nasal Cannula O2 Flow Rate: O2 Flow Rate (L/min): 3 L/min  Physical Exam: -somnolent, chronically ill appearing -normal breathing pattern -would not follow commands, hallucinating. Alert and oriented but with intermittent confusion.              Palliative Care Assessment & Plan  HPI: Per colleague [name], NP: 86 y.o. female  with past medical history of chronic respiratory failure on 2 L of oxygen at home, chronic diastolic dysfunction heart failure, HTN/HLD, A. fib on chronic anticoagulation, HTN with complications of CKD stage IV, history of malignant lymphoma, aortic atherosclerosis on CT August 2021, COVID-19 infection April 2021 admitted on 06/30/2020 with acute on chronic heart failure with acute on chronic kidney disease.   Code Status:  Full code  Goals of Care/Recommendations:  Extensive discussion regarding code status. Daughter considering DNR.   Daughter verbalizes wishes and agreement with her mother to discontinue any further dialysis treatments however undecided about transitioning to comfort with awareness life will be limited. Would like to speak further with Nephrologist.   Open to residential hospice once confirms goal of comfort. States family is unable to care for in the home.   PMT will continue to support and follow.   Prognosis: POOR   Discharge Planning: To Be Determined  Thank you for allowing the Palliative Medicine Team to assist in the care of this patient.  Time Total: 65 min.   Visit consisted of counseling and education dealing with the complex and emotionally intense issues of symptom management and palliative care in the setting of serious and potentially life-threatening illness.Greater than 50%  of this time was spent counseling and coordinating care related to the above assessment and plan.  Alda Lea, AGPCNP-BC  Palliative Medicine Team [phone]

## 2020-07-07 NOTE — Progress Notes (Signed)
PT was called down for Dialysis but refused treatment.  Nephrology notified

## 2020-07-07 NOTE — Progress Notes (Signed)
PROGRESS NOTE    Kristy Solomon  JJH:[id number] DOB: 12/05/1928 DOA: 06/03/2020 PCP: Jodi Marble, MD   Brief Narrative:  [name] is an 86 yo AA female with PMH chronic respiratory failure (on 2L [place] O2), HFpEF, permanent afib (on Xarelto), HTN, CKDIV HLD, hx lymphoma, anemia of chronic disease who presented to the hospital with worsening SOB. She had been out of her medications at home for approx a couple weeks.  She was found to be volume overloaded and in afib with RVR. Nephrology and cardiology were consulted on admission.  A CXR was obtained which revealed bilateral pleural effusions and pulmonary edema.  She was started on IV Lasix and resumed back on her beta-blocker. Patient was not responding well to diuresis, becoming more oliguric. Started on Lasix infusion along with midodrine to see if she responds. Patient started diuresing with improved urinary output but worsening creatinine, Lasix infusion was discontinued due to worsening creatinine.  Patient is not a good candidate for dialysis but wants to try it.  She does not want to give up and remain full code. Nephrology decided to place HD catheter tomorrow to give her a trial of dialysis. Palliative care was consulted.  Subjective: Patient was having some cough when seen this morning.  She just choked on a piece of bacon.  Saturating well.  Able to answer all questions.  Daughter was at bedside.  She was unable to tolerate dialysis yesterday and refusing to continue today. Later per nursing concern she was becoming more confused and drowsy.  Assessment & Plan:   Principal Problem:   Acute on chronic combined systolic (congestive) and diastolic (congestive) heart failure (HCC) Active Problems:   Waldenstrom macroglobulinemia (HCC)   Thrombocytopenia (HCC)   HTN (hypertension)   GERD (gastroesophageal reflux disease)   CKD (chronic kidney disease), stage IV (HCC)   Atrial fibrillation with rapid ventricular response  (HCC)   Non compliance w medication regimen   Permanent atrial fibrillation (HCC)  Acute on chronic heart failure with preserved ejection fraction (HFpEF) ([place]).  Cardiology is following-appreciate their recommendation. Initially treated with IV Lasix and then it was held for couple of days due to worsening renal function.  Home dose of torsemide was restarted by cardiology, IV Lasix at an higher dose was resumed due to poor urinary output. Patient remained oliguric, just made 400 cc over the past 24-hour with IV Lasix of 80 mg twice daily.  Softer blood pressure.   -She was started on Lasix infusion along with midodrine on 06/29/20, improved diuresis but creatinine continue to get worse and Lasix infusion was discontinued.  Remains volume up.  Per cardiology she might not tolerate dialysis but patient wants to give it a trial. Albumin was also added for oncotic support. -Metoprolol dose was decreased to 75 by cardiology. -Continue with strict intake and output and daily weight. -Palliative care was consulted to discuss goals of care-daughter wants to keep her full code until tomorrow before making any final decisions regarding hospice care.  Hypotension.  Intermittently becoming hypotensive.  There was a huge difference between blood pressure measured on right and left upper extremities.  Right systolic mostly in the 81E with left systolic in 56D and 14H.  No prior diagnosis of coarctation or subclavian stenosis. -Continue midodrine 5 mg 3 times a day.  Atrial fibrillation with rapid ventricular response ([place]).  Most likely secondary to being out of medications at home.  Currently rate well controlled. Had another episode of A. fib  with RVR while getting dialysis resulted in discontinuation of dialysis early. -Continue home dose of Toprol. -Xarelto was discontinued due to worsening renal function and she was started on heparin infusion. -Eliquis intolerance was listed in her chart-later able to  transition her to renally dose Eliquis.  GERD (gastroesophageal reflux disease) - continue PPI  HTN (hypertension).  Blood pressure within goal today. Keep holding home antihypertensives as patient is on midodrine now.  AKI with CKD stage IV.  Most likely secondary to cardiorenal syndrome.  Baseline creatinine around 1.7 per nephrology. Nephrology is following-appreciate their recommendations. Worsening creatinine.  Remains volume overload. Patient decided to try dialysis-HD catheter to be placed yesterday and patient received 1 partial dialysis as she developed A. fib with RVR while 30-minute in dialysis and it was discontinued.  Patient does not want to continue with dialysis anymore.  She told her the same thing to nephrology and they are recommending hospice care. -Continue to monitor. -Avoid nephrotoxins  Thrombocytopenia ([place]).  Seems stable and improving. - patient has mild chronic thrombocytopenia at baseline ~120-140k - etiology due to her underlying Waldenstrom's macroglobulinemia and effect from ibrutinib; follows with oncology  Waldenstrom macroglobulinemia [facility]) - follows with oncology; last OV 04/23/20  Objective: Vitals:   07/07/20 1000 07/07/20 1100 07/07/20 1119 07/07/20 1531  BP:  (!) 152/89 116/71 (!) 137/114  Pulse:  74 64 87  Resp: (!) 30 (!) 26 18 18   Temp:   98.6 F (37 C) 98 F (36.7 C)  TempSrc:   Oral Oral  SpO2:  98% 100% 100%  Weight:      Height:        Intake/Output Summary (Last 24 hours) at 07/07/2020 1657 Last data filed at 07/07/2020 1526 Gross per 24 hour  Intake 150 ml  Output 0 ml  Net 150 ml   Filed Weights   07/05/20 0400 07/11/2020 0352 07/07/20 0309  Weight: 99.1 kg 97.5 kg 98.1 kg    Examination:  General.  Lethargic looking elderly lady, in no acute distress. Pulmonary.  Lungs clear bilaterally, normal respiratory effort. CV.  Regular rate and rhythm, no JVD, rub or murmur. Abdomen.  Soft, nontender, nondistended, BS  positive. CNS.  Alert and oriented .  No focal neurologic deficit. Extremities.  1+ LE edema, no cyanosis, pulses intact and symmetrical. Psychiatry.  Judgment and insight appears impaired.  DVT prophylaxis: Eliquis Code Status: Full Family Communication: Daughter was updated at bedside, she was upset that why she was taken for dialysis and does not think that her mom has capacity to make decisions.  She wants to proceed with hospice care but wants to keep full code till she will meet again with palliative care tomorrow.  Disposition Plan:  Status is: Inpatient  Remains inpatient appropriate because:Inpatient level of care appropriate due to severity of illness   Dispo: The patient is from: Home              Anticipated d/c is to: Home              Anticipated d/c date is: 2-3 days.              Patient currently is not medically stable to d/c.  Patient is high risk for readmission, deterioration and death.  Remains volume overload.  History of being noncompliant.  Unable to tolerate trial of dialysis yesterday. Had another family meeting tomorrow with palliative and hopefully will be transition to comfort care.  Consultants:   Cardiology  Nephrology  Palliative care  Procedures:  Antimicrobials:   Data Reviewed: I have personally reviewed following labs and imaging studies  CBC: Recent Labs  Lab 07/01/20 0641 07/02/20 0427 07/03/20 0456 07/07/20 0353  WBC 5.0 4.2 5.3 8.6  HGB 10.9* 11.0* 11.6* 10.8*  HCT 37.4 36.9 37.6 35.5*  MCV 75.9* 75.2* 74.3* 73.7*  PLT 170 181 179 161*   Basic Metabolic Panel: Recent Labs  Lab 07/03/20 0456 07/03/20 0456 07/04/20 0728 07/05/20 0328 07/29/2020 0526 07/24/2020 1909 07/07/20 0353  NA 138  --  137 141 143  --  141  K 4.3  --  4.4 4.1 4.6  --  4.7  CL 99  --  99 100 101  --  101  CO2 29  --  28 28 29   --  24  GLUCOSE 96  --  97 113* 133*  --  138*  BUN 82*  --  90* 90* 85*  --  78*  CREATININE 4.04*  --  4.43* 4.09* 3.54*   --  3.13*  CALCIUM 8.9  --  9.0 9.1 9.3  --  9.2  PHOS 5.7*   < > 5.4* 4.9* 4.9* 5.2* 5.1*   < > = values in this interval not displayed.   GFR: Estimated Creatinine Clearance: 14.1 mL/min (A) (by C-G formula based on SCr of 3.13 mg/dL (H)). Liver Function Tests: Recent Labs  Lab 07/03/20 0456 07/04/20 0728 07/05/20 0328 07/08/2020 0526 07/07/20 0353  ALBUMIN 3.1* 2.9* 3.5 3.5 3.5   No results for input(s): LIPASE, AMYLASE in the last 168 hours. No results for input(s): AMMONIA in the last 168 hours. Coagulation Profile: No results for input(s): INR, PROTIME in the last 168 hours. Cardiac Enzymes: No results for input(s): CKTOTAL, CKMB, CKMBINDEX, TROPONINI in the last 168 hours. BNP (last 3 results) No results for input(s): PROBNP in the last 8760 hours. HbA1C: No results for input(s): HGBA1C in the last 72 hours. CBG: No results for input(s): GLUCAP in the last 168 hours. Lipid Profile: No results for input(s): CHOL, HDL, LDLCALC, TRIG, CHOLHDL, LDLDIRECT in the last 72 hours. Thyroid Function Tests: No results for input(s): TSH, T4TOTAL, FREET4, T3FREE, THYROIDAB in the last 72 hours. Anemia Panel: No results for input(s): VITAMINB12, FOLATE, FERRITIN, TIBC, IRON, RETICCTPCT in the last 72 hours. Sepsis Labs: No results for input(s): PROCALCITON, LATICACIDVEN in the last 168 hours.  No results found for this or any previous visit (from the past 240 hour(s)).   Radiology Studies: PERIPHERAL VASCULAR CATHETERIZATION  Result Date: 07/17/2020 See op note   Scheduled Meds: . allopurinol  50 mg Oral Daily  . apixaban  2.5 mg Oral BID  . atorvastatin  20 mg Oral Daily  . Chlorhexidine Gluconate Cloth  6 each Topical Q0600  . feeding supplement (NEPRO CARB STEADY)  237 mL Oral BID BM  . ferrous sulfate  325 mg Oral Q breakfast  . gabapentin  300 mg Oral TID  . metoprolol succinate  50 mg Oral Daily  . midodrine  10 mg Oral TID WC  . multivitamin  1 tablet Oral QHS  .  polyethylene glycol  17 g Oral Daily  . senna-docusate  1 tablet Oral BID  . sodium chloride flush  3 mL Intravenous Q12H   Continuous Infusions: . sodium chloride    . sodium chloride    . sodium chloride    . albumin human 12.5 g (07/07/20 0905)     LOS: 18 days   Time spent: 35  minutes.  Lorella Nimrod, MD [facility]  If 7PM-7AM, please contact night-coverage [email or web address]  07/07/2020, 4:57 PM   This record has been created using Systems analyst. Errors have been sought and corrected,but may not always be located. Such creation errors do not reflect on the standard of care.

## 2020-07-07 NOTE — Progress Notes (Signed)
Notified E Ouma NP or HR Afib sustaining in 130's-160's. Gave 10 am toprol 50mg  po  now,

## 2020-07-07 NOTE — Progress Notes (Signed)
[place], [place] 07/07/20  Subjective:   Hospital day # 18  Patient awake, alert but lethargic today.  She refuses to go for dialysis today, states' I do not feel like going today,I'm tired'.  Patient is able to state her name and place appropriately, but found hallucinating, having intermittent conversation with a person, who is  not present in the room.  Renal: 10/04 0701 - 10/05 0700 In: 150 [P.O.:150] Out: 0  Lab Results  Component Value Date   CREATININE 3.13 (H) 07/07/2020   CREATININE 3.54 (H) 08/02/2020   CREATININE 4.09 (H) 07/05/2020     Objective:  Vital signs in last 24 hours:  Temp:  [97.5 F (36.4 C)-99 F (37.2 C)] 98.6 F (37 C) (10/05 1119) Pulse Rate:  [48-122] 64 (10/05 1119) Resp:  [18-38] 18 (10/05 1119) BP: (105-182)/(59-111) 116/71 (10/05 1119) SpO2:  [94 %-100 %] 100 % (10/05 1119) Weight:  [98.1 kg] 98.1 kg (10/05 0309)  Weight change: 0.544 kg Filed Weights   07/05/20 0400 07/19/2020 0352 07/07/20 0309  Weight: 99.1 kg 97.5 kg 98.1 kg    Intake/Output:    Intake/Output Summary (Last 24 hours) at 07/07/2020 1324 Last data filed at 07/07/2020 0950 Gross per 24 hour  Intake 150 ml  Output 0 ml  Net 150 ml     Physical Exam: General:  Awake, alert, not in acute distress  HEENT  Normocephalic, atraumatic  Pulm/lungs  lungs clear but diminished, normal symmetric respiratory effort  CVS/Heart  Irregular, S1S2, no rubs or gallops  Abdomen:   Soft, obese, nondistended  Extremities:  Trace bilateral lower extremity edema  Neurologic:  Awake, lethargic, hallucinating intermittently  Skin:  No acute lesions or rashes    Basic Metabolic Panel:  Recent Labs  Lab 07/03/20 0456 07/03/20 0456 07/04/20 0728 07/04/20 0728 07/05/20 0328 07/15/2020 0526 07/16/2020 1909 07/07/20 0353  NA 138  --  137  --  141 143  --  141  K 4.3  --  4.4  --  4.1 4.6  --  4.7  CL 99  --  99  --  100 101  --  101  CO2 29  --  28  --   28 29  --  24  GLUCOSE 96  --  97  --  113* 133*  --  138*  BUN 82*  --  90*  --  90* 85*  --  78*  CREATININE 4.04*  --  4.43*  --  4.09* 3.54*  --  3.13*  CALCIUM 8.9   < > 9.0   < > 9.1 9.3  --  9.2  PHOS 5.7*   < > 5.4*  --  4.9* 4.9* 5.2* 5.1*   < > = values in this interval not displayed.     CBC: Recent Labs  Lab 07/01/20 0641 07/02/20 0427 07/03/20 0456 07/07/20 0353  WBC 5.0 4.2 5.3 8.6  HGB 10.9* 11.0* 11.6* 10.8*  HCT 37.4 36.9 37.6 35.5*  MCV 75.9* 75.2* 74.3* 73.7*  PLT 170 181 179 138*      Lab Results  Component Value Date   HEPBSAG NON REACTIVE 07/15/2020   HEPBSAB NON REACTIVE 07/12/2020   HEPBIGM NON REACTIVE 07/12/2020      Microbiology:  No results found for this or any previous visit (from the past 240 hour(s)).  Coagulation Studies: No results for input(s): LABPROT, INR in the last 72 hours.  Urinalysis: No results for input(s): COLORURINE, LABSPEC, [place],  GLUCOSEU, HGBUR, BILIRUBINUR, KETONESUR, PROTEINUR, UROBILINOGEN, NITRITE, LEUKOCYTESUR in the last 72 hours.  Invalid input(s): APPERANCEUR    Imaging: PERIPHERAL VASCULAR CATHETERIZATION  Result Date: 07/19/2020 See op note    Medications:   . sodium chloride    . sodium chloride    . sodium chloride    . albumin human 12.5 g (07/07/20 0905)   . allopurinol  50 mg Oral Daily  . apixaban  2.5 mg Oral BID  . atorvastatin  20 mg Oral Daily  . Chlorhexidine Gluconate Cloth  6 each Topical Q0600  . feeding supplement (NEPRO CARB STEADY)  237 mL Oral BID BM  . ferrous sulfate  325 mg Oral Q breakfast  . gabapentin  300 mg Oral TID  . metoprolol succinate  50 mg Oral Daily  . midodrine  10 mg Oral TID WC  . multivitamin  1 tablet Oral QHS  . polyethylene glycol  17 g Oral Daily  . senna-docusate  1 tablet Oral BID  . sodium chloride flush  3 mL Intravenous Q12H   sodium chloride, sodium chloride, sodium chloride, acetaminophen, alteplase, heparin, HYDROmorphone  (DILAUDID) injection, ipratropium-albuterol, lidocaine (PF), lidocaine-prilocaine, ondansetron (ZOFRAN) IV, ondansetron (ZOFRAN) IV, pentafluoroprop-tetrafluoroeth, sodium chloride flush  Assessment/ Plan:  86 y.o. female with congestive heart failure, hypertension, atrial fibrillation, hyperlipidemia, history of lymphoma admitted on 06/06/2020 for Hypoxia [R09.02] Elevated TSH [R79.89] Acute CHF (congestive heart failure) ([place]) [I50.9] Acute on chronic congestive heart failure, unspecified heart failure type (HCC) [I50.9]   #Acute kidney injury with volume overload #Chronic kidney disease stage IV.  Baseline creatinine 1.75 from May 08, 2020.  GFR 26-30.  Underlying CKD likely secondary to atherosclerosis, advanced age and hypertension  AKI likely secondary to cardiorenal syndrome Lab Results  Component Value Date   CREATININE 3.13 (H) 07/07/2020   CREATININE 3.54 (H) 07/27/2020   CREATININE 4.09 (H) 07/05/2020  Patient received her first dialysis treatment yesterday Had issues with tachyarrhythmias postdialysis Dialysis RN noticed patient hallucinating during dialysis Today, patient refused to go for dialysis Will continue monitoring May consider palliative care/comfort care Family in the process of establishing POA  #Hypotension, chronic Blood pressure readings within acceptable range We will continue to provide midodrine support.   May consider albumin if required         LOS: [place] 10/5/20211:24 PM  [place], [place]

## 2020-07-07 NOTE — Progress Notes (Signed)
OT Cancellation Note  Patient Details Name: Kristy Solomon MRN: [id number] DOB: 20-Jul-1929   Cancelled Treatment:    Reason Eval/Treat Not Completed: Patient declined, no reason specified. Pt refusal of dialysis today. OT encouraging therapy but pt keeps eyes closed and verbalizes, " Oh, no no no" . OT will re-attempt when pt is able to participate in therapeutic intervention.   Darleen Crocker, [place], OTR/L , CBIS ascom [phone]  07/07/20, 3:56 PM   07/07/2020, 3:53 PM

## 2020-07-07 NOTE — Progress Notes (Signed)
Mobility Specialist - Progress Note   07/07/20 1144  Mobility  Activity Dangled on edge of bed  Level of Assistance Maximum assist, patient does 25-49%  Assistive Device None  Distance Ambulated (ft) 0 ft  Mobility Response Tolerated well  Mobility performed by Mobility specialist  $Mobility charge 1 Mobility    Pre-mobility: 58 HR, 90% SpO2 During mobility: 141 HR, 98% SpO2 Post-mobility: 118 HR, 116/71 BP, 98% SpO2   Pt was lying in bed upon arrival utilizing 3L, with daughter present in room. Pt agreed to session. Pt was AOx3 to self, location, and reason. However, pt seemed cognitively unaware this session as she would get easily distracted, going in and out of conversation. Pt washed her face, mobility assisted with set up. Pt was able to get EOB with maxA. While dangling EOB, pt c/o dizziness. Further mobility was limited d/t dizziness never resolving while EOB and pt not being able to keep eyes open for bed-chair transition. Pt was able to get EOB-supine with maxA. Noted A-fib pattern this session as HR would bounce around from 68-141 bpm even with min exertion. NT entered at the end of session. Overall, pt tolerated session well. Pt was left in bed with breakfast tray in reach. Nurse was notified.    Kathee Delton Mobility Specialist 07/07/20, 11:53 AM

## 2020-07-08 ENCOUNTER — Inpatient Hospital Stay: Payer: Medicare HMO

## 2020-07-08 DIAGNOSIS — N184 Chronic kidney disease, stage 4 (severe): Secondary | ICD-10-CM | POA: Diagnosis not present

## 2020-07-08 DIAGNOSIS — I4821 Permanent atrial fibrillation: Secondary | ICD-10-CM | POA: Diagnosis not present

## 2020-07-08 DIAGNOSIS — I5043 Acute on chronic combined systolic (congestive) and diastolic (congestive) heart failure: Secondary | ICD-10-CM | POA: Diagnosis not present

## 2020-07-08 DIAGNOSIS — N179 Acute kidney failure, unspecified: Secondary | ICD-10-CM | POA: Diagnosis not present

## 2020-07-08 DIAGNOSIS — R0902 Hypoxemia: Secondary | ICD-10-CM | POA: Diagnosis not present

## 2020-07-08 DIAGNOSIS — Z66 Do not resuscitate: Secondary | ICD-10-CM

## 2020-07-08 DIAGNOSIS — I4891 Unspecified atrial fibrillation: Secondary | ICD-10-CM | POA: Diagnosis not present

## 2020-07-08 LAB — RENAL FUNCTION PANEL
Albumin: 3.6 g/dL (ref 3.5–5.0)
Anion gap: 18 — ABNORMAL HIGH (ref 5–15)
BUN: 90 mg/dL — ABNORMAL HIGH (ref 8–23)
CO2: 22 mmol/L (ref 22–32)
Calcium: 9.2 mg/dL (ref 8.9–10.3)
Chloride: 100 mmol/L (ref 98–111)
Creatinine, Ser: 3.46 mg/dL — ABNORMAL HIGH (ref 0.44–1.00)
GFR calc non Af Amer: 11 mL/min — ABNORMAL LOW (ref >60)
Glucose, Bld: 110 mg/dL — ABNORMAL HIGH (ref 70–99)
Phosphorus: 5.9 mg/dL — ABNORMAL HIGH (ref 2.5–4.6)
Potassium: 4.8 mmol/L (ref 3.5–5.1)
Sodium: 140 mmol/L (ref 135–145)

## 2020-07-08 LAB — BLOOD GAS, ARTERIAL
Acid-Base Excess: 0.2 mmol/L (ref 0.0–2.0)
Allens test (pass/fail): POSITIVE — AB
Bicarbonate: 24.6 mmol/L (ref 20.0–28.0)
FIO2: 0.28
O2 Saturation: 98.3 %
Patient temperature: 37
pCO2 arterial: 38 mmHg (ref 32.0–48.0)
pH, Arterial: 7.42 (ref 7.350–7.450)
pO2, Arterial: 109 mmHg — ABNORMAL HIGH (ref 83.0–108.0)

## 2020-07-08 LAB — PARATHYROID HORMONE, INTACT (NO CA): PTH: 117 pg/mL — ABNORMAL HIGH (ref 15–65)

## 2020-07-08 LAB — GLUCOSE, CAPILLARY: Glucose-Capillary: 108 mg/dL — ABNORMAL HIGH (ref 70–99)

## 2020-07-08 MED ORDER — NALOXONE HCL 0.4 MG/ML IJ SOLN
INTRAMUSCULAR | Status: AC
  Administered 2020-07-08: 0.4 mg via INTRAVENOUS
  Filled 2020-07-08: qty 1

## 2020-07-08 MED ORDER — GLYCOPYRROLATE 0.2 MG/ML IJ SOLN
0.3000 mg | INTRAMUSCULAR | Status: DC | PRN
  Administered 2020-07-08 – 2020-07-09 (×2): 0.3 mg via INTRAVENOUS
  Filled 2020-07-08 (×5): qty 1.5

## 2020-07-08 MED ORDER — GABAPENTIN 300 MG PO CAPS: 300.0000 mg | ORAL_CAPSULE | Freq: Every day | ORAL | Status: DC

## 2020-07-08 MED ORDER — POLYVINYL ALCOHOL 1.4 % OP SOLN
1.0000 [drp] | Freq: Four times a day (QID) | OPHTHALMIC | Status: DC | PRN
  Filled 2020-07-08: qty 15

## 2020-07-08 MED ORDER — SENNOSIDES-DOCUSATE SODIUM 8.6-50 MG PO TABS: 1.0000 | ORAL_TABLET | Freq: Two times a day (BID) | ORAL | Status: DC | PRN

## 2020-07-08 MED ORDER — HYDROMORPHONE HCL 1 MG/ML IJ SOLN
0.5000 mg | INTRAMUSCULAR | Status: DC | PRN
  Administered 2020-07-08 – 2020-07-09 (×4): 0.5 mg via INTRAVENOUS
  Filled 2020-07-08 (×4): qty 1

## 2020-07-08 MED ORDER — METOPROLOL TARTRATE 5 MG/5ML IV SOLN
5.0000 mg | Freq: Once | INTRAVENOUS | Status: AC
  Administered 2020-07-08: 5 mg via INTRAVENOUS
  Filled 2020-07-08: qty 5

## 2020-07-08 MED ORDER — LORAZEPAM 2 MG/ML IJ SOLN
0.5000 mg | INTRAMUSCULAR | Status: DC | PRN
  Administered 2020-07-08 – 2020-07-09 (×3): 0.5 mg via INTRAVENOUS
  Filled 2020-07-08 (×3): qty 1

## 2020-07-08 MED ORDER — BIOTENE DRY MOUTH MT LIQD: 15.0000 mL | OROMUCOSAL | Status: DC | PRN

## 2020-07-08 MED ORDER — NALOXONE HCL 0.4 MG/ML IJ SOLN: 0.4000 mg | INTRAMUSCULAR | Status: DC | PRN

## 2020-07-08 NOTE — Progress Notes (Signed)
PT Cancellation Note  Patient Details Name: Kristy Solomon MRN: [id number] DOB: [birth year]   Cancelled Treatment:    Reason Eval/Treat Not Completed: Patient not medically ready.  Chart reviewed.  Pt noted with rapid response event this morning.  Will hold PT at this time and re-attempt PT treatment session at a later date/time as medically appropriate.  Leitha Bleak, PT 07/08/20, 10:21 AM

## 2020-07-08 NOTE — TOC Progression Note (Signed)
Transition of Care [facility]) - Progression Note    Patient Details  Name: Kristy Solomon MRN: [id number] Date of Birth: 1928/11/04  Transition of Care [facility]) CM/SW [place], RN Phone Number: 07/08/2020, 1:39 PM  Clinical Narrative:      Hospice Home referral made with Santiago Glad and Kieth Brightly at [place].

## 2020-07-08 NOTE — Progress Notes (Signed)
Daily Progress Note   Patient Name: Kristy Solomon       Date: 07/08/2020 DOB: Jun 23, 1929  Age: 86 y.o. MRN#: [id number] Attending Physician: Lorella Nimrod, MD Primary Care Physician: Jodi Marble, MD Admit Date: 07/02/2020  Reason for Consultation/Follow-up: Establishing goals of care  Subjective: Patient is somewhat somnolent.  Continued moaning observed.  Will intermittently open eyes with no verbal response.  Appears uncomfortable and restless.  Rapid response called earlier this morning due to possibility of sedation after Dilaudid administration.  Family at the bedside [name] and her daughter).  Extensive updates provided regarding patient's current illness and poor prognosis.  Family verbalized understanding.  All questions answered to the best of my ability.  I again discussed at length with family patient's poor prognosis in the setting of discontinuation of hemodialysis (as requested by patient and daughter), poor appetite, combined heart failure with limited interventions.  I had a open and honest discussion with family sharing recommendations for care to focus on her comfort allowing patient to spend what time she has left amongst her family and in a comfortable state.  Patient observed to be suffering, restless in bed, and in some discomfort but unable to verbalize.  Family asking patient if she is hurting and she nods her head yes while keeping her eyes closed the entire time.  I shared with family patient's obvious signs of decline and distress.  Family tearful verbalizing their understanding and agreement of their observation that patient appears tired and further declining.  Therapeutic listening and emotional support provided.  Family expressed they do not want patient to suffer and would not want to continue to see her in her current state of distress.  Daughter states she would like what ever needs to be done to gain comfort for her mother and allow her to be at peace  to be initiated.  I created space and opportunity to further discuss patient's full CODE STATUS with consideration of her current illness, comorbidities, and family's expressed wishes for patient to be comfortable with what time she has left.  I discussed at length with a code situation would look like for patient with a very minimal chances of survival.  Daughter verbalized understanding and expressed wishes for patient to be a DNR/DNI.  Education provided to family regarding DNR and care allowing patient to naturally pass away when that time comes without her old measures.  Family again verbalized their understanding confirming wishes for DNR and transitioning all care to focus on patient's comfort/end-of-life.  Detailed education regarding what comfort care measures would look like for patient while hospitalized in addition to options for outpatient hospice in the home or at a hospice facility.  Family verbalizes understanding and expressing their difficulty in the ability to manage her care at home.  They are requesting residential hospice placement with the preference of [place] location.  Education provided on the referral process and transfer once patient is accepted and bed is available.  All questions answered and support provided.  Length of Stay: 19 days  Vital Signs: BP (!) 152/81 (BP Location: Right Arm)   Pulse (!) 57   Temp (!) 97.5 F (36.4 C) (Oral)   Resp 17   Ht 5\' 5"  (1.651 m)   Wt 98.1 kg   SpO2 100%   BMI 35.98 kg/m  SpO2: SpO2: 100 % O2 Device: O2 Device: Nasal Cannula O2 Flow Rate: O2 Flow Rate (L/min): 2 L/min  Physical Exam: -somnolent, restless, frail, chronically-ill appearing -irregular,  tachycardic -use of accessory muscles, 2LNC -will not follow commands or respond to verbal stimuli, moves all extremities  -intermittent sitting up in bed and lying back             Palliative Care Assessment & Plan  HPI: Per colleague [name], NP: 46  y.o.femalewith past medical history of chronic respiratory failure on 2 L of oxygen at home, chronic diastolic dysfunction heart failure, HTN/HLD, A. fib on chronic anticoagulation, HTN with complications of CKD stage IV, history of malignant lymphoma, aortic atherosclerosis on CT August 2021, COVID-19 infection April 2021admitted on 9/17/2021with acute on chronic heart failure with acute on chronic kidney disease.   Code Status:  DNR  Goals of Care/Recommendations:  DNR/DNI-as requested and confirmed by family.  Completed form placed on chart  Transition all care to focus on comfort.  Family requesting residential hospice placement (TOC notified and orders placed for referral)  We will discontinue medical interventions and treatment not focused on comfort Dilaudid PRN for pain/air hunger/comfort Robinul PRN for excessive secretions Ativan PRN for agitation/anxiety Zofran PRN for nausea Liquifilm tears PRN for dry eyes Haldol PRN for agitation/anxiety May have comfort feeding Comfort cart for family Unrestricted visitations in the setting of EOL (per policy) Oxygen PRN 2L or less for comfort. No escalation.  PMT will continue to support and follow.  Prognosis: < 2 weeks  Discharge Planning: Hospice facility  Thank you for allowing the Palliative Medicine Team to assist in the care of this patient.  Time Total: 55 min.   Visit consisted of counseling and education dealing with the complex and emotionally intense issues of symptom management and palliative care in the setting of serious and potentially life-threatening illness.Greater than 50%  of this time was spent counseling and coordinating care related to the above assessment and plan.  Alda Lea, AGPCNP-BC  Palliative Medicine Team [phone]

## 2020-07-08 NOTE — Progress Notes (Signed)
PT Cancellation Note  Patient Details Name: Kristy Solomon MRN: [id number] DOB: 1929-07-07   Cancelled Treatment:    Reason Eval/Treat Not Completed: Other (comment).  PT order cancelled by Palliative Care NP and pt noted to be comfort care.  Leitha Bleak, PT 07/08/20, 4:46 PM

## 2020-07-08 NOTE — Progress Notes (Signed)
Telemetry called about 5 aberrant beat for this pt. Notified provider about it and increased heart rate. Per previous notes, this is not a new episode; pt hr increase possbly brought on by inconsistency in medication regimen, and has been difficult to control. Provider Ouma ordered Metoprolol for blood pressure and HR control for this episode. RN will administer as ordered and will monitor for efficacy

## 2020-07-08 NOTE — Progress Notes (Signed)
Patient's respirations are 20 per min. Lung sounds wet/gurgly. Facial expression is relaxed. Chest rise is even. Eyes closed. RN attempted to suction secretions, but unable to do so successfully as she resisted yankeur placement. Will continue to monitor.

## 2020-07-08 NOTE — Progress Notes (Signed)
[place], [place] 07/08/20  Subjective:   Hospital day # 19  Patient found in bed with her eyes closed, moaning, unable to follow commands. Daughter and grand daughter at the bedside.  Patient required RRT initiation this morning, due to altered mental status possibly due to oversedation, after receiving pain medication .She still  appears lethargic,restless, intermittently taking her oxygen off of her nostrils, discussed with the family regarding  patient's refusal to dialysis yesterday, and goals of care.  Awaiting palliative care consult at this point.  Renal: 10/05 0701 - 10/06 0700 In: -  Out: 2120 [Urine:2120] Lab Results  Component Value Date   CREATININE 3.46 (H) 07/08/2020   CREATININE 3.13 (H) 07/07/2020   CREATININE 3.54 (H) 07/28/2020     Objective:  Vital signs in last 24 hours:  Temp:  [97.5 F (36.4 C)-98 F (36.7 C)] 97.5 F (36.4 C) (10/06 1207) Pulse Rate:  [48-114] 57 (10/06 1207) Resp:  [17-22] 17 (10/06 1207) BP: (102-159)/(70-116) 152/81 (10/06 1207) SpO2:  [98 %-100 %] 100 % (10/06 1207)  Weight change:  Filed Weights   07/05/20 0400 07/03/2020 0352 07/07/20 0309  Weight: 99.1 kg 97.5 kg 98.1 kg    Intake/Output:    Intake/Output Summary (Last 24 hours) at 07/08/2020 1502 Last data filed at 07/08/2020 0500 Gross per 24 hour  Intake --  Output 2120 ml  Net -2120 ml     Physical Exam: General:  Lethargic,restless,moaning  HEENT  Normocephalic, atraumatic,nsal canula in place  Pulm/lungs  lungs diminished, Respirations labored,accessory muscle use +  CVS/Heart  S1S2, no rubs or gallops  Abdomen:   Soft, nondistended,non tender  Extremities:  No peripheral edema  Neurologic:  Lethargic, unable to open eyes or follow commands, restless  Skin:  No acute lesions or rashes    Basic Metabolic Panel:  Recent Labs  Lab 07/04/20 0728 07/04/20 0728 07/05/20 0328 07/05/20 0328 07/08/2020 0526 07/05/2020 1909  07/07/20 0353 07/08/20 0545  NA 137  --  141  --  143  --  141 140  K 4.4  --  4.1  --  4.6  --  4.7 4.8  CL 99  --  100  --  101  --  101 100  CO2 28  --  28  --  29  --  24 22  GLUCOSE 97  --  113*  --  133*  --  138* 110*  BUN 90*  --  90*  --  85*  --  78* 90*  CREATININE 4.43*  --  4.09*  --  3.54*  --  3.13* 3.46*  CALCIUM 9.0   < > 9.1   < > 9.3  --  9.2 9.2  PHOS 5.4*   < > 4.9*  --  4.9* 5.2* 5.1* 5.9*   < > = values in this interval not displayed.     CBC: Recent Labs  Lab 07/02/20 0427 07/03/20 0456 07/07/20 0353  WBC 4.2 5.3 8.6  HGB 11.0* 11.6* 10.8*  HCT 36.9 37.6 35.5*  MCV 75.2* 74.3* 73.7*  PLT 181 179 138*      Lab Results  Component Value Date   HEPBSAG NON REACTIVE 07/10/2020   HEPBSAB NON REACTIVE 07/14/2020   HEPBIGM NON REACTIVE 07/31/2020      Microbiology:  No results found for this or any previous visit (from the past 240 hour(s)).  Coagulation Studies: No results for input(s): LABPROT, INR in the last 72 hours.  Urinalysis:  No results for input(s): COLORURINE, LABSPEC, [place], GLUCOSEU, HGBUR, BILIRUBINUR, KETONESUR, PROTEINUR, UROBILINOGEN, NITRITE, LEUKOCYTESUR in the last 72 hours.  Invalid input(s): APPERANCEUR    Imaging: PERIPHERAL VASCULAR CATHETERIZATION  Result Date: 07/22/2020 See op note  DG Chest Port 1 View  Result Date: 07/08/2020 CLINICAL DATA:  Shortness of breath. EXAM: PORTABLE CHEST 1 VIEW COMPARISON:  06/28/2020 FINDINGS: Similar enlargement the cardiac silhouette. Aortic atherosclerosis. Right IJ central venous catheter with the tip projecting at the superior vena cava. Similar pulmonary vascular congestion. Similar left basilar opacities with silhouetting of the left hemidiaphragm. Possible small bilateral pleural effusions. No pneumothorax. IMPRESSION: 1. Similar left basilar opacities. 2. Cardiomegaly and vascular congestion. Possible small bilateral pleural effusions. 3. Interval placement of a right IJ  central venous catheter with the tip projecting at the SVC. Electronically Signed   By: Margaretha Sheffield MD   On: 07/08/2020 08:44     Medications:   . sodium chloride     . midodrine  10 mg Oral TID WC  . sodium chloride flush  3 mL Intravenous Q12H   sodium chloride, acetaminophen, antiseptic oral rinse, glycopyrrolate, HYDROmorphone (DILAUDID) injection, LORazepam, ondansetron (ZOFRAN) IV, polyvinyl alcohol, senna-docusate, sodium chloride flush  Assessment/ Plan:  86 y.o. female with congestive heart failure, hypertension, atrial fibrillation, hyperlipidemia, history of lymphoma admitted on 06/05/2020 for Hypoxia [R09.02] Elevated TSH [R79.89] Acute CHF (congestive heart failure) (HCC) [I50.9] Acute on chronic congestive heart failure, unspecified heart failure type (HCC) [I50.9]   #Acute kidney injury with volume overload #Chronic kidney disease stage IV.  Baseline creatinine 1.75 from May 08, 2020.  GFR 26-30.  Underlying CKD likely secondary to atherosclerosis, advanced age and hypertension  AKI likely secondary to cardiorenal syndrome Lab Results  Component Value Date   CREATININE 3.46 (H) 07/08/2020   CREATININE 3.13 (H) 07/07/2020   CREATININE 3.54 (H) 07/08/2020  Patient found declining, with altered mental status Family at the bedside, awaiting palliative consult Not planning for  dialysis today  #Hypotension, chronic Blood pressure readings within low normal range We will continue midodrine        LOS: [place] 10/6/20213:02 PM  [facility] [place], [place]

## 2020-07-08 NOTE — Progress Notes (Signed)
   07/08/20 0800  Clinical Encounter Type  Visited With Patient;Health care provider  Visit Type Follow-up  Referral From Nurse  Consult/Referral To Chaplain

## 2020-07-08 NOTE — Progress Notes (Signed)
[facility]) Hospital Liaison RN note:  Received request from Kristy Gibbon, NP and [place], [facility] for family interest in [facility]. Chart reviewed and eligibility has been approved.  Spoke with daughter, Kristy Solomon in the room to confirm interests, initiate education related to hospice philosphy and explain services. She verbalized understanding and had no questions.   Hospice Home is not able to offer a bed today. Family and hospital care team are aware. [place] Liaison will follow for room availability.   Please call with any hospice related questions or concerns.  Thank you for the opportunity to participate in this patient's care.  Zandra Abts, RN [facility] Liaison [phone]

## 2020-07-08 NOTE — Progress Notes (Addendum)
   07/08/20 0800  Clinical Encounter Type  Visited With Patient;Health care provider  Visit Type Follow-up  Referral From Nurse  Consult/Referral To Chaplain  Chaplain responded to RR. When chaplain arrived staff was working on Pt. After everyone cleared out accept nurses, chaplain went in. Chaplain had been silently praying outside the room, but she went to bedside and prayed. Chaplain will follow up later.

## 2020-07-08 NOTE — Progress Notes (Signed)
Telemetry called to report all leads off. Patient continues to remove leads. Will place pt on standby for now. Will attempt to place leads again.

## 2020-07-08 NOTE — Significant Event (Signed)
Rapid Response Event Note   Reason for Call : called RR on patient with AMS... change from previous assessment.   Initial Focused Assessment: Pt laying in bed. Reportedly received a dose of dilaudid before shift change this morning. Obtunded. Responds to pain. VSS      Interventions: Dr Reesa Chew to bedside. Narcan, ABG, and Chest Xray ordered. Pt came around more with narcan... confused, but able to speak and answer simple questions. Vital signs remain stable.   Plan of Care: [place], and Radiographer, therapeutic to call if further assistance needed.    Event Summary:   MD Notified:  Call Time: Arrival Time: End Time:  Kristy Solomon A, RN

## 2020-07-08 NOTE — Progress Notes (Signed)
   07/08/20 0500  Assess: MEWS Score  BP (!) 150/116  Pulse Rate (!) 111  Resp (!) 22  SpO2 100 %  O2 Device Nasal Cannula  O2 Flow Rate (L/min) 2 L/min  Assess: MEWS Score  MEWS Temp 0  MEWS Systolic 0  MEWS Pulse 2  MEWS RR 1  MEWS LOC 0  MEWS Score 3  MEWS Score Color Yellow  Take Vital Signs  Increase Vital Sign Frequency  Yellow: Q 2hr X 2 then Q 4hr X 2, if remains yellow, continue Q 4hrs  Escalate  MEWS: Escalate Yellow: discuss with charge nurse/RN and consider discussing with provider and RRT  Notify: Charge Nurse/RN  Name of Charge Nurse/RN Notified Angela, RN  Date Charge Nurse/RN Notified 07/08/20  Time Charge Nurse/RN Notified 0620  Notify: Provider  Provider Name/Title E. Stark Klein, NP  Date Provider Notified 07/08/20  Time Provider Notified [phone]  Notification Type Page  Notification Reason Change in status  Response No new orders (Pt condition within parameters for this admission)  Date of Provider Response 07/08/20  Time of Provider Response 0500  Document  Patient Outcome  (Pt stable and remains on dept)  Will continue to monitor patient.

## 2020-07-08 NOTE — Progress Notes (Signed)
OT Cancellation Note  Patient Details Name: Kristy Solomon MRN: [id number] DOB: [birth year]   Cancelled Treatment:    Reason Eval/Treat Not Completed: Medical issues which prohibited therapy. Rapid response called this morning. OT will hold treatment. OT will follow up when pt is medically able to participate in therapeutic intervention.   Darleen Crocker, [place], OTR/L , CBIS ascom [phone]  07/08/20, 10:24 AM   07/08/2020, 10:23 AM

## 2020-07-08 NOTE — Progress Notes (Signed)
   07/08/20 0807  Assess: MEWS Score  Temp 97.7 F (36.5 C)  BP 117/86  Pulse Rate 88  Level of Consciousness Responds to Pain  SpO2 100 %  O2 Device Nasal Cannula  O2 Flow Rate (L/min) 2 L/min  Assess: MEWS Score  MEWS Temp 0  MEWS Systolic 0  MEWS Pulse 0  MEWS RR 1  MEWS LOC 2  MEWS Score 3  MEWS Score Color Comfort Care Only  Assess: if the MEWS score is Yellow or Red  Were vital signs taken at a resting state? Yes  Focused Assessment Change from prior assessment (see assessment flowsheet)  Early Detection of Sepsis Score *See Row Information* Low  MEWS guidelines implemented *See Row Information* Yes  Treat  MEWS Interventions Administered prn meds/treatments;Escalated (See documentation below)  Pain Scale 0-10  Pain Score Asleep  Take Vital Signs  Increase Vital Sign Frequency  Yellow: Q 2hr X 2 then Q 4hr X 2, if remains yellow, continue Q 4hrs  Escalate  MEWS: Escalate Yellow: discuss with charge nurse/RN and consider discussing with provider and RRT  Notify: Charge Nurse/RN  Name of Charge Nurse/RN Notified [place], RN  Date Charge Nurse/RN Notified 07/08/20  Time Charge Nurse/RN Notified 0810  Notify: Provider  Provider Name/Title Dr. Reesa Chew  Date Provider Notified 07/08/20  Time Provider Notified [phone]  Notification Type Page  Notification Reason Change in status  Response See new orders (narcan administered)  Date of Provider Response 07/08/20  Time of Provider Response 0815  Notify: Rapid Response  Name of Rapid Response RN Notified Gracelyn Nurse., RN  Date Rapid Response Notified 07/08/20  Time Rapid Response Notified 0623  Document  Patient Outcome Stabilized after interventions  Progress note created (see row info) Yes  RR called at this time due to change in LOC. New order for narcan, administered with improvement of pt mentation. MD at bedside, called daughter and requested meeting. Daughter and granddaughter at bedside, met with palliative and  attending. New orders for comfort measures only. Will CTM.

## 2020-07-08 NOTE — Progress Notes (Signed)
PROGRESS NOTE    Kristy Solomon  HQI:[id number] DOB: 1929/06/03 DOA: 06/12/2020 PCP: Jodi Marble, MD   Brief Narrative:  Kristy Solomon is an 86 yo AA female with PMH chronic respiratory failure (on 2L [place] O2), HFpEF, permanent afib (on Xarelto), HTN, CKDIV HLD, hx lymphoma, anemia of chronic disease who presented to the hospital with worsening SOB. She had been out of her medications at home for approx a couple weeks.  She was found to be volume overloaded and in afib with RVR. Nephrology and cardiology were consulted on admission.  A CXR was obtained which revealed bilateral pleural effusions and pulmonary edema.  She was started on IV Lasix and resumed back on her beta-blocker. Patient was not responding well to diuresis, becoming more oliguric. Started on Lasix infusion along with midodrine to see if she responds. Patient started diuresing with improved urinary output but worsening creatinine, Lasix infusion was discontinued due to worsening creatinine.  Patient is not a good candidate for dialysis but wants to try it.  She does not want to give up and remain full code. Nephrology decided to place HD catheter tomorrow to give her a trial of dialysis. Palliative care was consulted.  Subjective: Rapid response was called earlier in the morning and patient was noticed to be unresponsive and with shallow breathing.  She received 1 mg of Dilaudid around 6 AM and she was complaining of pain.  Responded well to Narcan.  Later patient was becoming more agitated and appears in pain.  Not following commands.  Assessment & Plan:   Principal Problem:   Acute on chronic combined systolic (congestive) and diastolic (congestive) heart failure (HCC) Active Problems:   Kristy Solomon (HCC)   Thrombocytopenia (HCC)   HTN (hypertension)   GERD (gastroesophageal reflux disease)   CKD (chronic kidney disease), stage IV (HCC)   Atrial fibrillation with rapid ventricular response  (HCC)   Non compliance w medication regimen   Permanent atrial fibrillation (HCC)  Acute on chronic heart failure with preserved ejection fraction (HFpEF) ([place]).  Cardiology is following-appreciate their recommendation. Initially treated with IV Lasix and then it was held for couple of days due to worsening renal function.  Home dose of torsemide was restarted by cardiology, IV Lasix at an higher dose was resumed due to poor urinary output. Patient remained oliguric, just made 400 cc over the past 24-hour with IV Lasix of 80 mg twice daily.  Softer blood pressure.   -She was started on Lasix infusion along with midodrine on 06/29/20, improved diuresis but creatinine continue to get worse and Lasix infusion was discontinued.  Remains volume up.  Per cardiology she might not tolerate dialysis but patient wants to give it a trial. Albumin was also added for oncotic support. 07/08/2020.  Due to worsening mental status and inability to tolerate dialysis, patient was transitioned to full comfort measures with the consent of her daughter, granddaughter and with the help of palliative care.  Hypotension.  Intermittently becoming hypotensive.  There was a huge difference between blood pressure measured on right and left upper extremities.  Right systolic mostly in the 13K with left systolic in 44W and 10U.  No prior diagnosis of coarctation or subclavian stenosis. -Patient is full comfort measures now.  Atrial fibrillation with rapid ventricular response ([place]).  Most likely secondary to being out of medications at home.  Currently rate well controlled. Had another episode of A. fib with RVR while getting dialysis resulted in discontinuation of dialysis early. -Full  comfort measures only.  GERD (gastroesophageal reflux disease) - continue PPI  HTN (hypertension).  Blood pressure within goal today. Keep holding home antihypertensives as patient is on midodrine now.  AKI with CKD stage IV.  Most likely  secondary to cardiorenal syndrome.  Baseline creatinine around 1.7 per nephrology. Nephrology is following-appreciate their recommendations. Worsening creatinine.  Remains volume overload. Patient decided to try dialysis-HD catheter to be placed yesterday and patient received 1 partial dialysis as she developed A. fib with RVR while 30-minute in dialysis and it was discontinued.  Patient does not want to continue with dialysis anymore.  She told  the same thing to nephrology and they are recommending hospice care. -Transition to full comfort measures-will be going to hospice home once bed is available.   Thrombocytopenia ([place]).  Seems stable and improving. - patient has mild chronic thrombocytopenia at baseline ~120-140k - etiology due to her underlying Kristy's Solomon and effect from ibrutinib; follows with oncology  Kristy Solomon [facility]) - follows with oncology; last OV 04/23/20  Objective: Vitals:   07/08/20 0806 07/08/20 0807 07/08/20 1000 07/08/20 1207  BP:  117/86 102/70 (!) 152/81  Pulse: 92 88 91 (!) 57  Resp:    17  Temp:  97.7 F (36.5 C) 97.7 F (36.5 C) (!) 97.5 F (36.4 C)  TempSrc:  Oral Axillary Oral  SpO2: 100% 100% 99% 100%  Weight:      Height:        Intake/Output Summary (Last 24 hours) at 07/08/2020 1525 Last data filed at 07/08/2020 1515 Gross per 24 hour  Intake --  Output 2245 ml  Net -2245 ml   Filed Weights   07/05/20 0400 07/31/2020 0352 07/07/20 0309  Weight: 99.1 kg 97.5 kg 98.1 kg    Examination:  General.  Chronically ill-appearing, lethargic elderly lady, appears in pain. Pulmonary.  Lungs clear bilaterally, normal respiratory effort. CV.  Regular rate and rhythm, no JVD, rub or murmur. Abdomen.  Soft, nontender, nondistended, BS positive. CNS.  Agitated and not following any commands. Extremities.  2+ LE edema, no cyanosis, pulses intact and symmetrical. Psychiatry.  Judgment and insight appears  impaired.  DVT prophylaxis: Patient is comfort care now. Code Status: Full Family Communication: Daughter was updated at bedside.  Disposition Plan:  Status is: Inpatient  Remains inpatient appropriate because:Inpatient level of care appropriate due to severity of illness   Dispo: The patient is from: Home              Anticipated d/c is to: Home              Anticipated d/c date is: 2-3 days.              Patient currently is not medically stable to d/c.  Patient is high risk for readmission, deterioration and death.  Patient with advanced heart failure and unable to tolerate dialysis.  Transition to full comfort measure today-waiting for hospice home bed availability.  Consultants:   Cardiology  Nephrology  Palliative care  Procedures:  Antimicrobials:   Data Reviewed: I have personally reviewed following labs and imaging studies  CBC: Recent Labs  Lab 07/02/20 0427 07/03/20 0456 07/07/20 0353  WBC 4.2 5.3 8.6  HGB 11.0* 11.6* 10.8*  HCT 36.9 37.6 35.5*  MCV 75.2* 74.3* 73.7*  PLT 181 179 076*   Basic Metabolic Panel: Recent Labs  Lab 07/04/20 0728 07/04/20 0728 07/05/20 0328 07/25/2020 0526 07/08/2020 1909 07/07/20 0353 07/08/20 0545  NA 137  --  141 143  --  141 140  K 4.4  --  4.1 4.6  --  4.7 4.8  CL 99  --  100 101  --  101 100  CO2 28  --  28 29  --  24 22  GLUCOSE 97  --  113* 133*  --  138* 110*  BUN 90*  --  90* 85*  --  78* 90*  CREATININE 4.43*  --  4.09* 3.54*  --  3.13* 3.46*  CALCIUM 9.0  --  9.1 9.3  --  9.2 9.2  PHOS 5.4*   < > 4.9* 4.9* 5.2* 5.1* 5.9*   < > = values in this interval not displayed.   GFR: Estimated Creatinine Clearance: 12.8 mL/min (A) (by C-G formula based on SCr of 3.46 mg/dL (H)). Liver Function Tests: Recent Labs  Lab 07/04/20 0728 07/05/20 0328 07/23/2020 0526 07/07/20 0353 07/08/20 0545  ALBUMIN 2.9* 3.5 3.5 3.5 3.6   No results for input(s): LIPASE, AMYLASE in the last 168 hours. No results for input(s):  AMMONIA in the last 168 hours. Coagulation Profile: No results for input(s): INR, PROTIME in the last 168 hours. Cardiac Enzymes: No results for input(s): CKTOTAL, CKMB, CKMBINDEX, TROPONINI in the last 168 hours. BNP (last 3 results) No results for input(s): PROBNP in the last 8760 hours. HbA1C: No results for input(s): HGBA1C in the last 72 hours. CBG: Recent Labs  Lab 07/08/20 0821  GLUCAP 108*   Lipid Profile: No results for input(s): CHOL, HDL, LDLCALC, TRIG, CHOLHDL, LDLDIRECT in the last 72 hours. Thyroid Function Tests: No results for input(s): TSH, T4TOTAL, FREET4, T3FREE, THYROIDAB in the last 72 hours. Anemia Panel: No results for input(s): VITAMINB12, FOLATE, FERRITIN, TIBC, IRON, RETICCTPCT in the last 72 hours. Sepsis Labs: No results for input(s): PROCALCITON, LATICACIDVEN in the last 168 hours.  No results found for this or any previous visit (from the past 240 hour(s)).   Radiology Studies: DG Chest Port 1 View  Result Date: 07/08/2020 CLINICAL DATA:  Shortness of breath. EXAM: PORTABLE CHEST 1 VIEW COMPARISON:  06/28/2020 FINDINGS: Similar enlargement the cardiac silhouette. Aortic atherosclerosis. Right IJ central venous catheter with the tip projecting at the superior vena cava. Similar pulmonary vascular congestion. Similar left basilar opacities with silhouetting of the left hemidiaphragm. Possible small bilateral pleural effusions. No pneumothorax. IMPRESSION: 1. Similar left basilar opacities. 2. Cardiomegaly and vascular congestion. Possible small bilateral pleural effusions. 3. Interval placement of a right IJ central venous catheter with the tip projecting at the SVC. Electronically Signed   By: Margaretha Sheffield MD   On: 07/08/2020 08:44    Scheduled Meds: . midodrine  10 mg Oral TID WC  . sodium chloride flush  3 mL Intravenous Q12H   Continuous Infusions: . sodium chloride       LOS: 19 days   Time spent: 35 minutes.  Lorella Nimrod, MD [facility]  If 7PM-7AM, please contact night-coverage [email or web address]  07/08/2020, 3:25 PM   This record has been created using Systems analyst. Errors have been sought and corrected,but may not always be located. Such creation errors do not reflect on the standard of care.

## 2020-07-09 ENCOUNTER — Telehealth: Payer: Self-pay

## 2020-07-09 DIAGNOSIS — Z23 Encounter for immunization: Secondary | ICD-10-CM | POA: Diagnosis present

## 2020-07-09 MED ORDER — MORPHINE SULFATE (PF) 2 MG/ML IV SOLN
2.0000 mg | INTRAVENOUS | Status: DC | PRN
  Filled 2020-07-09: qty 1

## 2020-07-10 ENCOUNTER — Ambulatory Visit: Payer: Medicare HMO | Admitting: Family

## 2020-07-27 ENCOUNTER — Ambulatory Visit: Payer: Medicare HMO | Admitting: Hematology and Oncology

## 2020-07-27 ENCOUNTER — Other Ambulatory Visit: Payer: Medicare HMO

## 2020-07-30 ENCOUNTER — Ambulatory Visit: Payer: Medicare HMO | Admitting: Hematology and Oncology

## 2020-07-30 ENCOUNTER — Other Ambulatory Visit: Payer: Medicare HMO

## 2020-07-30 ENCOUNTER — Ambulatory Visit: Payer: Medicare HMO

## 2020-08-03 NOTE — Progress Notes (Signed)
Kristy Solomon, patient's daughter returned call to notify that she will be come to the hospital early this morning.

## 2020-08-03 NOTE — Progress Notes (Addendum)
Miguel Dibble, RN and Melody Comas, RN verified that patient's respirations stopped. Both RNs verified absence of pulse via palpation, absence of heart beat via auscultation, and visually confirmed dilation of pupils. Death pronounced at 0552 this morning. Charge nurse, [facility], and provider were notified. Chaplain on unit to support family members upon their arrival.

## 2020-08-03 NOTE — Telephone Encounter (Signed)
Upon review of patients chart to schedule TOC, I learned she passed away this morning.

## 2020-08-03 NOTE — Care Management Important Message (Signed)
Important Message  Patient Details  Name: Kristy Solomon MRN: [id number] Date of Birth: Jun 11, 1929   Medicare Important Message Given:  Other (see comment)  Patient expired this morning.     [name] 07/14/20, 8:41 AM

## 2020-08-03 NOTE — Progress Notes (Addendum)
   August 04, 2020 0700  Clinical Encounter Type  Visited With Family;Health care provider  Visit Type Follow-up;Spiritual support;Death  Referral From Nurse  Consult/Referral To Chaplain  Chaplain received a pg at 7:18 to meet with family. Around twenty minutes later, chaplain went to lobby to escort a grandson up. Pt's daughter encouraged her son to pray before entering the room. He stopped and prayed and went in. He did not stay in the room long, but left out after seeing his grandmother. Grandson and chaplain sat in the hall and talked. He explained how he was feeling and talked about the family dynamics.  Shortly after talking with him, chaplain received another pg to go down and bring up another grandson and his wife. While walking up the grandson asked a funny question and the three of them laughed. Chaplain told him she was glad he could laugh. Chaplain escorted him the two to the room, Pt's daughter asked chaplain to pray with them and she did. Chaplain didn't pray earlier because Pt's daughter prayed a heartfelt pray when she went to the bedside.  Family is waiting to see [employer]. Daughter, [name] was grateful for all that Malachi Bonds did to assist them. Chaplain Mel Almond passed those words on to [employer].

## 2020-08-03 NOTE — Progress Notes (Signed)
Ch visited with Pt's family as per recommendation by Chaplain. Kristy Solomon. Pt's daughter Kristy Solomon had requested chaplain for a visit and prayer after her mother's(Pt's) passing. Ch visited with family;Kristy Solomon introduced Ch to family, and requested prayer. Ch prayed with them. Kristy Solomon and thanked her. Kristy Solomon knows Ch from a previous visit.

## 2020-08-03 NOTE — Progress Notes (Signed)
Provider B. Randol Kern, NP notified of patient's death.

## 2020-08-03 NOTE — Progress Notes (Signed)
Cross Cover Note  Patient under comfort care measures expired 0552, pronounced by RN.

## 2020-08-03 NOTE — Progress Notes (Signed)
Chaplain stayed with family for over an hour.

## 2020-08-03 NOTE — Death Summary Note (Signed)
Death Summary  [name]:[id number] DOB: 1929-09-24 DOA: 07/13/2020  PCP: Jodi Marble, MD  Admit date: 07/15/2020 Date of Death: 2020/07/26 Time of Death: 5:52 AM Notification: Jodi Marble, MD notified of death of Jul 26, 2020   History of present illness:  Kristy Solomon is a 86 y.o. female with a history of chronic respiratory failure (on 2L  O2), HFpEF, permanent afib (on Xarelto), HTN, CKDIV HLD, hx lymphoma, anemia of chronic disease. [name] presented with complaint of worsening shortness of breath. Found to be volume overloaded and A. fib with RVR.  Patient had very low EF.  In attempt to diuresed she developed worsening renal failure and hypotension. Unable to diurese even with the help of midodrine and Lasix infusion.  Cardiology and nephrology was on board.  Would like to continue maximum care including dialysis despite telling us that she might not tolerate because of very low EF and other multiple comorbidities.  She insisted on starting dialysis, HD catheter was placed and she received only 1/2-hour session of dialysis and went into A. fib with RVR and become very hypotensive resulted in discontinuation of dialysis.  Later patient refused to continue after discussion that she cannot tolerate it. Patient continued to get worse, as we have exhausted all the options.  Palliative care was consulted and after discussing with family she was transitioned to full comfort care on 07/08/2020.  Patient passed within 24-hour after starting comfort measures.  Final Diagnoses:  1.   Combined systolic and diastolic heart failure  2.   Renal failure.   The results of significant diagnostics from this hospitalization (including imaging, microbiology, ancillary and laboratory) are listed below for reference.    Significant Diagnostic Studies: DG Chest 1 View  Result Date: 06/28/2020 CLINICAL DATA:  COVID-19 positivity with congestive failure EXAM: CHEST  1 VIEW  COMPARISON:  07/10/2020 FINDINGS: Cardiac shadow remains enlarged. Aortic calcifications are again seen. Mild vascular congestion is noted although improved from the prior exam. Interval clearing of right-sided effusion and basilar atelectasis is noted. Persistent left basilar opacity is noted and stable. IMPRESSION: Improved aeration particularly in the right lung base. Persistent opacity in the left base is seen. Electronically Signed   By: Inez Catalina M.D.   On: 06/28/2020 14:47   DG Chest 2 View  Result Date: 07/05/2020 CLINICAL DATA:  Shortness of breath EXAM: CHEST - 2 VIEW COMPARISON:  01/24/2020, CT 05/20/2020 FINDINGS: Small moderate bilateral pleural effusions left greater than right. Basilar airspace consolidations. Cardiomegaly with vascular congestion. Aortic atherosclerosis. No pneumothorax. IMPRESSION: Cardiomegaly with vascular congestion, small to moderate bilateral pleural effusions left greater than right, and bibasilar airspace consolidations which may reflect atelectasis or pneumonia Electronically Signed   By: Donavan Foil M.D.   On: 07/28/2020 01:54   PERIPHERAL VASCULAR CATHETERIZATION  Result Date: 07/11/2020 See op note  DG Chest Port 1 View  Result Date: 07/08/2020 CLINICAL DATA:  Shortness of breath. EXAM: PORTABLE CHEST 1 VIEW COMPARISON:  06/28/2020 FINDINGS: Similar enlargement the cardiac silhouette. Aortic atherosclerosis. Right IJ central venous catheter with the tip projecting at the superior vena cava. Similar pulmonary vascular congestion. Similar left basilar opacities with silhouetting of the left hemidiaphragm. Possible small bilateral pleural effusions. No pneumothorax. IMPRESSION: 1. Similar left basilar opacities. 2. Cardiomegaly and vascular congestion. Possible small bilateral pleural effusions. 3. Interval placement of a right IJ central venous catheter with the tip projecting at the SVC. Electronically Signed   By: Margaretha Sheffield MD  On: 07/08/2020  08:44   ECHOCARDIOGRAM COMPLETE  Result Date: 07/01/2020    ECHOCARDIOGRAM REPORT   Patient Name:   Kristy Solomon Date of Exam: 07/01/2020 Medical Rec #:  [id number]        Height:       65.0 in Accession #:    [id number]       Weight:       221.6 lb Date of Birth:  November 18, 1928        BSA:          2.066 m Patient Age:    83 years         BP:           116/64 mmHg Patient Gender: F                HR:           83 bpm. Exam Location:  [facility] Procedure: 2D Echo, Cardiac Doppler and Color Doppler Indications:     CHF-acute diastolic 295.28  History:         Patient has prior history of Echocardiogram examinations, most                  recent 12/31/2019. Risk Factors:Dyslipidemia and Hypertension.                  Permanent Atrial fibrillation.  Sonographer:     Sherrie Sport RDCS (AE) Referring Phys:  [phone] CHRISTOPHER END Diagnosing Phys: Kate Sable MD IMPRESSIONS  1. Left ventricular ejection fraction, by estimation, is 20 to 25%. The left ventricle has severely decreased function. The left ventricle demonstrates global hypokinesis. Left ventricular diastolic parameters are indeterminate.  2. Right ventricular systolic function is mildly reduced. The right ventricular size is normal.  3. Left atrial size was severely dilated.  4. Right atrial size was severely dilated.  5. Moderate pleural effusion in the left lateral region.  6. The mitral valve is normal in structure. Mild to moderate mitral valve regurgitation.  7. Tricuspid valve regurgitation is moderate.  8. The aortic valve is tricuspid. Aortic valve regurgitation is not visualized. FINDINGS  Left Ventricle: Left ventricular ejection fraction, by estimation, is 20 to 25%. The left ventricle has severely decreased function. The left ventricle demonstrates global hypokinesis. The left ventricular internal cavity size was normal in size. There is no left ventricular hypertrophy. Left ventricular diastolic parameters are indeterminate. Right Ventricle: The  right ventricular size is normal. Right vetricular wall thickness was not assessed. Right ventricular systolic function is mildly reduced. Left Atrium: Left atrial size was severely dilated. Right Atrium: Right atrial size was severely dilated. Pericardium: There is no evidence of pericardial effusion. Mitral Valve: The mitral valve is normal in structure. Mild to moderate mitral valve regurgitation. Tricuspid Valve: The tricuspid valve is normal in structure. Tricuspid valve regurgitation is moderate. Aortic Valve: The aortic valve is tricuspid. Aortic valve regurgitation is not visualized. Aortic valve mean gradient measures 2.7 mmHg. Aortic valve peak gradient measures 4.0 mmHg. Aortic valve area, by VTI measures 1.73 cm. Pulmonic Valve: The pulmonic valve was normal in structure. Pulmonic valve regurgitation is not visualized. Aorta: The aortic root is normal in size and structure. Venous: The inferior vena cava was not well visualized. IAS/Shunts: No atrial level shunt detected by color flow Doppler. Additional Comments: There is a moderate pleural effusion in the left lateral region.  LEFT VENTRICLE PLAX 2D LVIDd:         5.21 cm LVIDs:  4.27 cm LV PW:         1.15 cm LV IVS:        0.85 cm LVOT diam:     2.00 cm LV SV:         31 LV SV Index:   15 LVOT Area:     3.14 cm  RIGHT VENTRICLE RV Basal diam:  3.76 cm RV S prime:     10.20 cm/s TAPSE (M-mode): 3.8 cm LEFT ATRIUM              Index       RIGHT ATRIUM           Index LA diam:        4.50 cm  2.18 cm/m  RA Area:     32.40 cm LA Vol (A2C):   156.0 ml 75.50 ml/m RA Volume:   115.00 ml 55.66 ml/m LA Vol (A4C):   126.0 ml 60.98 ml/m LA Biplane Vol: 145.0 ml 70.18 ml/m  AORTIC VALVE                   PULMONIC VALVE AV Area (Vmax):    1.82 cm    PV Vmax:        0.80 m/s AV Area (Vmean):   1.55 cm    PV Peak grad:   2.5 mmHg AV Area (VTI):     1.73 cm    RVOT Peak grad: 2 mmHg AV Vmax:           99.57 cm/s AV Vmean:          74.233 cm/s AV  VTI:            0.181 m AV Peak Grad:      4.0 mmHg AV Mean Grad:      2.7 mmHg LVOT Vmax:         57.60 cm/s LVOT Vmean:        36.600 cm/s LVOT VTI:          0.100 m LVOT/AV VTI ratio: 0.55  AORTA Ao Root diam: 3.00 cm MITRAL VALVE                TRICUSPID VALVE MV Area (PHT): 3.48 cm     TR Peak grad:   43.6 mmHg MV Decel Time: 218 msec     TR Vmax:        330.00 cm/s MV E velocity: 120.00 cm/s                             SHUNTS                             Systemic VTI:  0.10 m                             Systemic Diam: 2.00 cm Kate Sable MD Electronically signed by Kate Sable MD Signature Date/Time: 07/01/2020/3:47:24 PM    Final     Microbiology: No results found for this or any previous visit (from the past 240 hour(s)).   Labs: Basic Metabolic Panel: Recent Labs  Lab 07/04/20 0728 07/04/20 0728 07/05/20 0328 07/05/20 0328 07/12/2020 0526 07/25/2020 0526 07/21/2020 1909 07/07/20 0353 07/08/20 0545  NA 137  --  141  --  143  --   --  141 140  K 4.4   < >  4.1   < > 4.6   < >  --  4.7 4.8  CL 99  --  100  --  101  --   --  101 100  CO2 28  --  28  --  29  --   --  24 22  GLUCOSE 97  --  113*  --  133*  --   --  138* 110*  BUN 90*  --  90*  --  85*  --   --  78* 90*  CREATININE 4.43*  --  4.09*  --  3.54*  --   --  3.13* 3.46*  CALCIUM 9.0  --  9.1  --  9.3  --   --  9.2 9.2  PHOS 5.4*   < > 4.9*  --  4.9*  --  5.2* 5.1* 5.9*   < > = values in this interval not displayed.   Liver Function Tests: Recent Labs  Lab 07/04/20 0728 07/05/20 0328 07/23/2020 0526 07/07/20 0353 07/08/20 0545  ALBUMIN 2.9* 3.5 3.5 3.5 3.6   No results for input(s): LIPASE, AMYLASE in the last 168 hours. No results for input(s): AMMONIA in the last 168 hours. CBC: Recent Labs  Lab 07/03/20 0456 07/07/20 0353  WBC 5.3 8.6  HGB 11.6* 10.8*  HCT 37.6 35.5*  MCV 74.3* 73.7*  PLT 179 138*   Cardiac Enzymes: No results for input(s): CKTOTAL, CKMB, CKMBINDEX, TROPONINI in the last 168  hours. D-Dimer No results for input(s): DDIMER in the last 72 hours. BNP: Invalid input(s): POCBNP CBG: Recent Labs  Lab 07/08/20 0821  GLUCAP 108*   Anemia work up No results for input(s): VITAMINB12, FOLATE, FERRITIN, TIBC, IRON, RETICCTPCT in the last 72 hours. Urinalysis    Component Value Date/Time   COLORURINE AMBER (A) 01/24/2020 0106   APPEARANCEUR HAZY (A) 01/24/2020 0106   APPEARANCEUR Cloudy (A) 12/24/2019 1354   LABSPEC 1.024 01/24/2020 0106   LABSPEC 1.019 04/12/2014 0111   PHURINE 5.0 01/24/2020 0106   GLUCOSEU NEGATIVE 01/24/2020 0106   GLUCOSEU Negative 04/12/2014 0111   HGBUR SMALL (A) 01/24/2020 0106   BILIRUBINUR SMALL (A) 01/24/2020 0106   BILIRUBINUR Negative 12/24/2019 1354   BILIRUBINUR Negative 04/12/2014 0111   KETONESUR NEGATIVE 01/24/2020 0106   PROTEINUR >=300 (A) 01/24/2020 0106   NITRITE NEGATIVE 01/24/2020 0106   LEUKOCYTESUR TRACE (A) 01/24/2020 0106   LEUKOCYTESUR 2+ 04/12/2014 0111   Sepsis Labs Invalid input(s): PROCALCITONIN,  WBC,  LACTICIDVEN   SIGNED:  Lorella Nimrod, MD  [facility] 08/04/20, 9:58 AM Pager   If 7PM-7AM, please contact night-coverage [email or web address] Password [email or web address]  This record has been created using Systems analyst. Errors have been sought and corrected,but may not always be located. Such creation errors do not reflect on the standard of care.

## 2020-08-03 NOTE — Progress Notes (Addendum)
   11-Jul-2020 0600  Clinical Encounter Type  Visited With Patient;Health care provider  Visit Type Follow-up;Spiritual support;Death  Referral From Nurse  Consult/Referral To Chaplain  Chaplain responded to PG, informing her of Pt's death. Chaplain went to room and talked with nurses while waiting for family. After 40 minutes, chaplain asked nurse to page her once family arrived.

## 2020-08-03 NOTE — Progress Notes (Addendum)
OT Cancellation Note  Patient Details Name: Kristy Solomon MRN: [id number] DOB: [birth year]   Cancelled Treatment:    Reason Eval/Treat Not Completed: Other (comment)  OT order noted to be cancelled by palliative NP on 07/08/20 as pt shifted to comfort measures. Thank you for involving OT in the care of this patient.   Gerrianne Scale, Nicolaus, OTR/L ascom [phone] July 24, 2020, 8:41 AM

## 2020-08-03 DEATH — deceased

## 2020-08-26 ENCOUNTER — Ambulatory Visit: Payer: Medicare HMO

## 2020-08-26 ENCOUNTER — Other Ambulatory Visit: Payer: Medicare HMO

## 2020-09-02 NOTE — Progress Notes (Signed)
   08/05/20 1245  Clinical Encounter Type  Visited With Family  Visit Type Follow-up  Referral From Chaplain  Consult/Referral To Chaplain  Chaplain received message to call Pt's daughter, Kristy Solomon. When chaplain reached out to Pt's daughter she wanted advice regarding how she is handling her mother's death. People are telling her that she should be doing better. Chaplain told Pt's daughter that she how she is feeling is normal. Chaplain told her not to allow anyone to make her feel guilty about the way she feels. Chaplain and [name] talked for about twenty-five minutes. After they finished talking, chaplain prayed with her.

## 2020-09-23 ENCOUNTER — Other Ambulatory Visit: Payer: Medicare HMO

## 2020-09-23 ENCOUNTER — Ambulatory Visit: Payer: Medicare HMO

## 2020-12-31 ENCOUNTER — Other Ambulatory Visit (HOSPITAL_COMMUNITY): Payer: Self-pay

## 2021-10-02 IMAGING — CT CT ABD-PELV W/O CM
2 of 4 series · 14 of 46 positions shown, 16 images · non-contrast
Comparison: 07/11/2018

CLINICAL DATA: Restaging lymphoma.

EXAM:
CT CHEST, ABDOMEN AND PELVIS WITHOUT CONTRAST
TECHNIQUE: Multidetector CT imaging of the chest, abdomen and pelvis was
performed following the standard protocol without IV contrast.

[Series 2: axials cap · axial · 0.81mm/px · z∈[-1438,-923]mm · 11 of 123 slices shown, 13 images]
[im 10/123  soft-tissue]
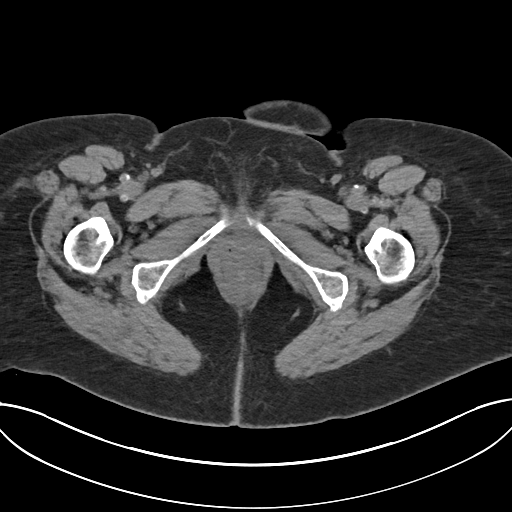
[im 10/123  bone]
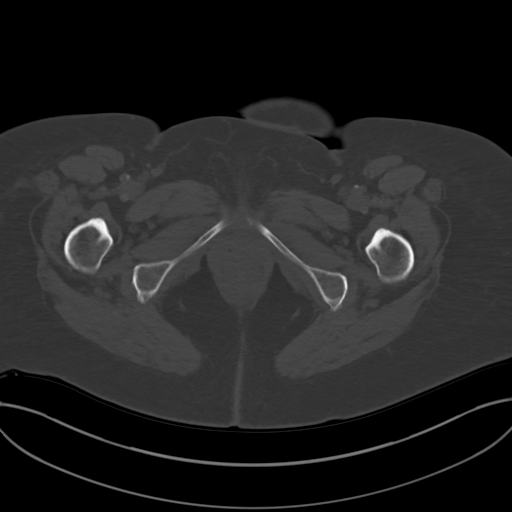
[im 19/123  soft-tissue]
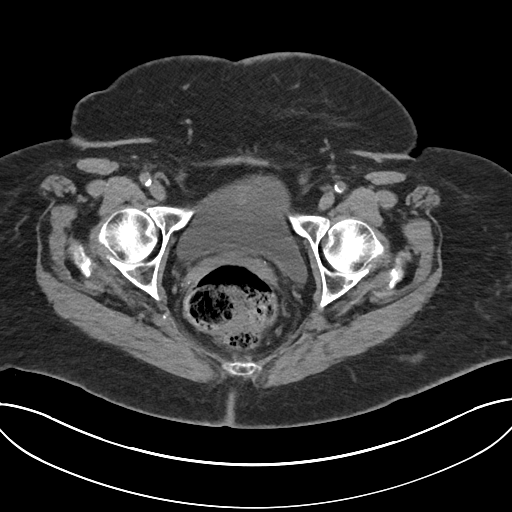
[im 29/123  soft-tissue]
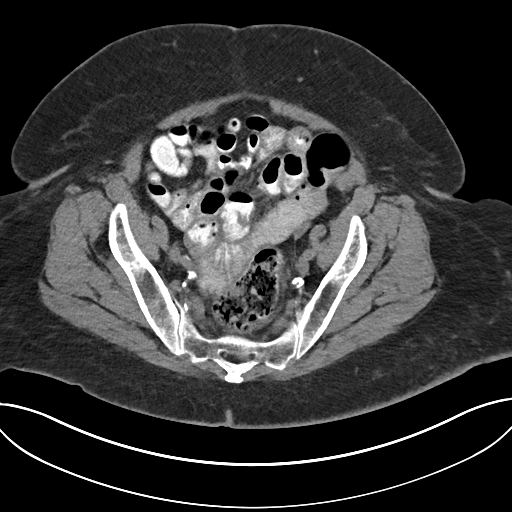
[im 38/123  soft-tissue]
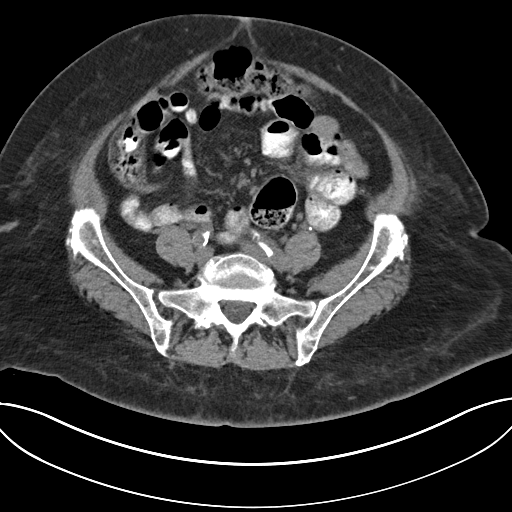
[im 47/123  soft-tissue]
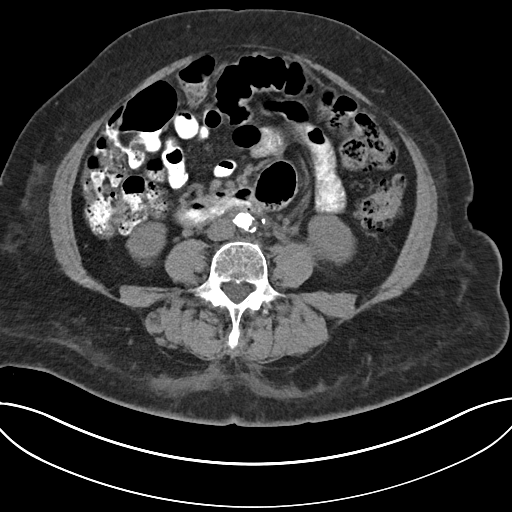
[im 66/123  soft-tissue]
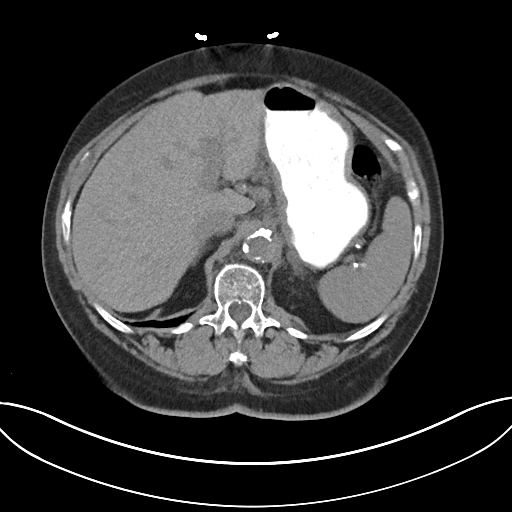
[im 76/123  soft-tissue]
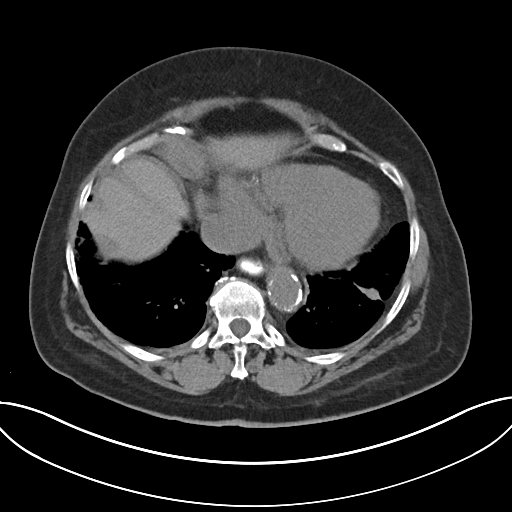
[im 85/123  soft-tissue]
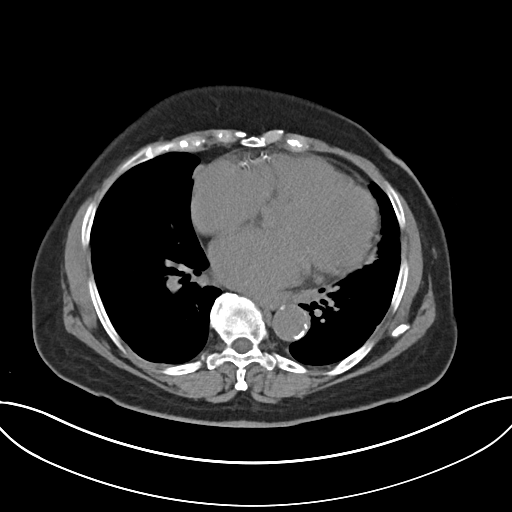
[im 94/123  soft-tissue]
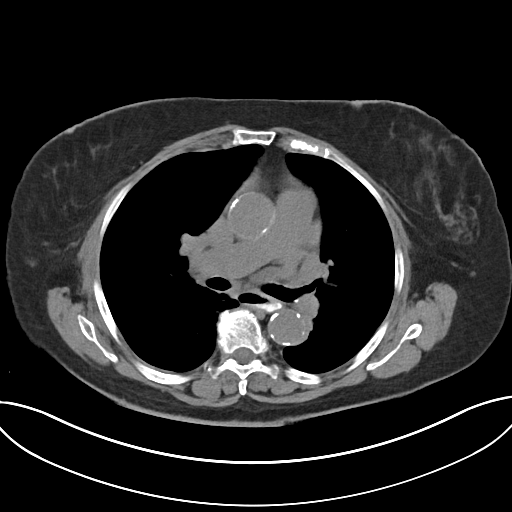
[im 94/123  bone]
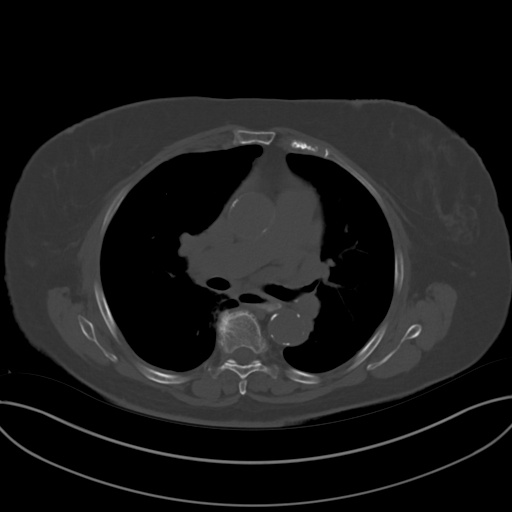
[im 104/123  soft-tissue]
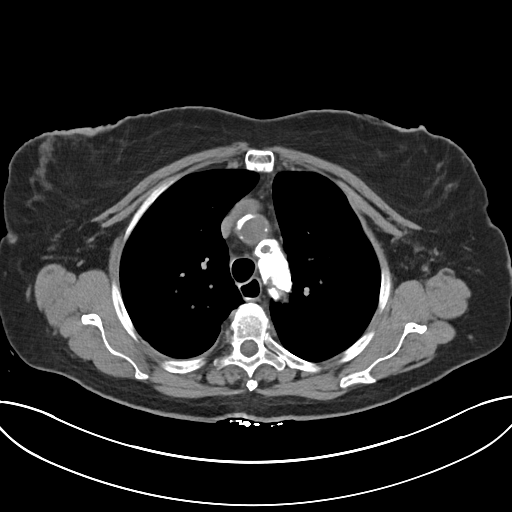
[im 113/123  soft-tissue]
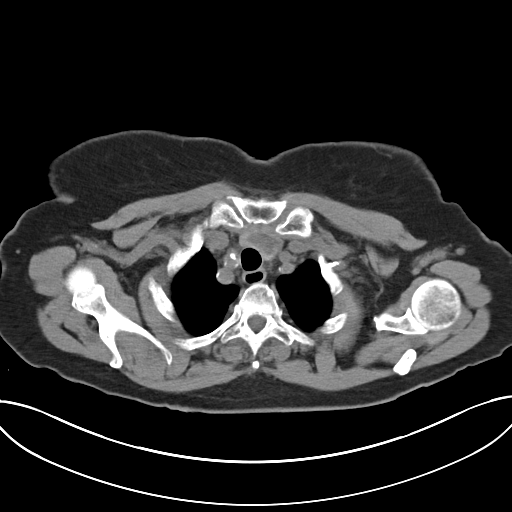

[Series 4: coronals cap · coronal · 0.81mm/px · 3 of 172 slices shown]
[im 58/172  soft-tissue]
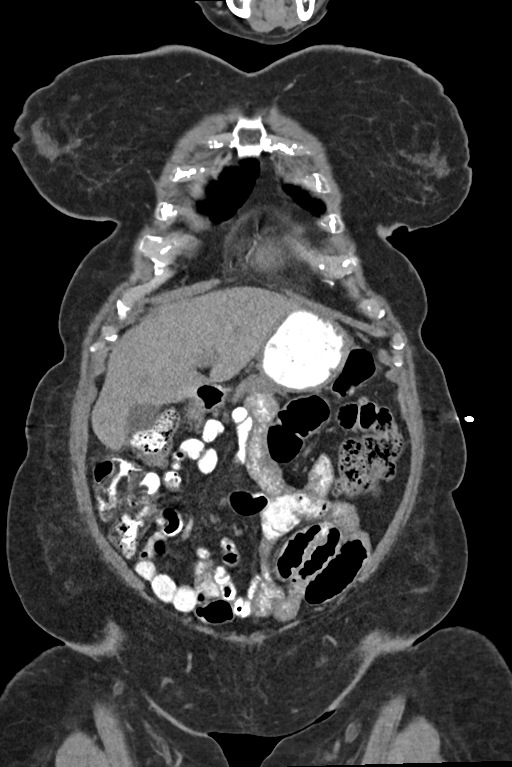
[im 77/172  soft-tissue]
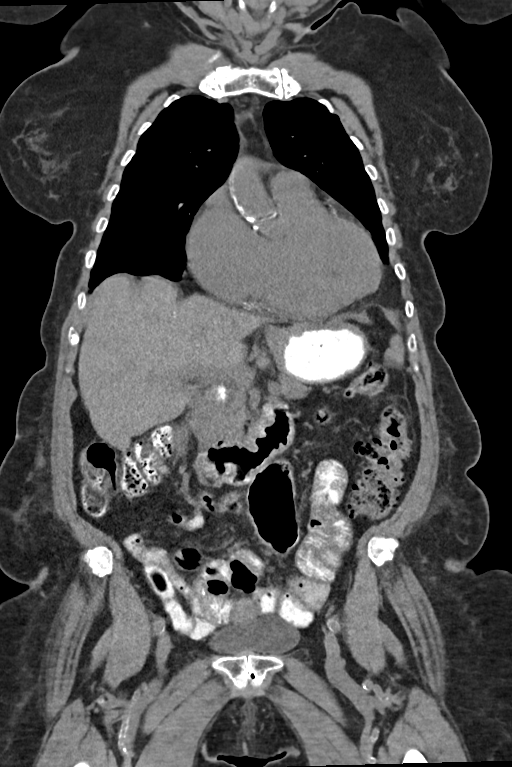
[im 96/172  soft-tissue]
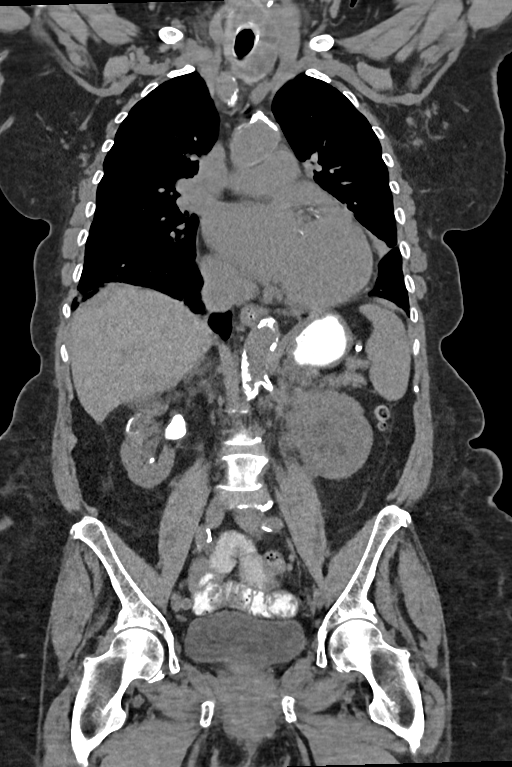

[14 of 46 positions shown; findings below may reference images not displayed]

FINDINGS: CT CHEST FINDINGS

Cardiovascular: The heart is mildly enlarged but stable. No
pericardial effusion. Stable advanced atherosclerotic calcifications
involving the aorta and branch vessels but no focal aneurysm.
Extensive three-vessel coronary artery calcifications.

Mediastinum/Nodes: No mediastinal or hilar mass or lymphadenopathy.
Stable sizable left thyroid lobe lesions the largest is in the lower
isthmus and measures 2.5 cm.

The esophagus is grossly normal.

Lungs/Pleura: Patchy nodular airspace opacities in the lower lobes
bilaterally but no discrete mass. Stable basilar scarring changes.
No pleural effusions.

Musculoskeletal: Small scattered chest wall lesions are again
demonstrated. 2.6 cm left medial breast lesion previously measured
2.3 cm.

Right parascapular subcutaneous nodule measures 17.5 mm and
previously measured 24.5 mm. Smaller right-sided subcutaneous
lesions have decreased in size since the prior study. No new
lesions.

No significant bony findings.

CT ABDOMEN PELVIS FINDINGS

Hepatobiliary: No focal hepatic lesions or intrahepatic biliary
dilatation. The gallbladder appears normal. No common bile duct
dilatation.

Pancreas: No mass, inflammation or ductal dilatation.

Spleen: Normal size.  No focal lesions.

Adrenals/Urinary Tract: The adrenal glands are unremarkable and
stable.

Extensive right-sided renal calculi along with a rim calcified cyst.
No worrisome right renal lesions. No hydroureteronephrosis.

The left kidney demonstrates persistent ill-defined soft tissue
infiltration in the renal sinus and extending into the region of the
vascular pedicle. This could be chronic infiltrating lymphoma. No
new or progressive findings. Stable left renal calculi in the lower
pole region.

The bladder is unremarkable.

Stomach/Bowel: The stomach, duodenum, small bowel and colon are
grossly normal. No acute inflammatory changes, mass lesions or
obstructive findings. The terminal ileum and appendix are normal.
Scattered colonic diverticulosis without findings for acute
diverticulitis.

Vascular/Lymphatic: Advanced atherosclerotic calcifications
involving the aorta and branch vessels. No mesenteric or
retroperitoneal lymphadenopathy. No pelvic adenopathy. No inguinal
adenopathy.

Reproductive: Surgically absent.

Other: No pelvic mass or free pelvic fluid collections.

Musculoskeletal: No significant bony findings.
IMPRESSION: 1. Stable left breast lesion. The other subcutaneous lesions
involving the right chest wall have decreased in size since the
prior examination. No new or progressive findings.
2. Improved CT appearance of the lungs. Persistent patchy airspace
opacities and scarring type changes but no discrete mass or nodule.
No mediastinal or hilar adenopathy.
3. Stable soft tissue density infiltrating the left kidney and left
renal sinus and vascular pedicle region.
4. Stable bilateral renal calculi.
5. No abdominal/pelvic lymphadenopathy or inguinal adenopathy.
6. Stable severe atherosclerotic disease.
